# Patient Record
Sex: Female | Born: 1940 | Race: White | Hispanic: No | Marital: Married | Smoking: Former smoker
Health system: Southern US, Community
[De-identification: ages and names within clinical notes are randomized; demographics above are authoritative.]

## PROBLEM LIST (undated history)

## (undated) DIAGNOSIS — D649 Anemia, unspecified: Secondary | ICD-10-CM

## (undated) DIAGNOSIS — G894 Chronic pain syndrome: Secondary | ICD-10-CM

## (undated) DIAGNOSIS — M545 Low back pain, unspecified: Secondary | ICD-10-CM

## (undated) DIAGNOSIS — I2510 Atherosclerotic heart disease of native coronary artery without angina pectoris: Secondary | ICD-10-CM

## (undated) DIAGNOSIS — N259 Disorder resulting from impaired renal tubular function, unspecified: Secondary | ICD-10-CM

## (undated) DIAGNOSIS — I219 Acute myocardial infarction, unspecified: Secondary | ICD-10-CM

## (undated) DIAGNOSIS — R3989 Other symptoms and signs involving the genitourinary system: Secondary | ICD-10-CM

## (undated) DIAGNOSIS — K589 Irritable bowel syndrome without diarrhea: Secondary | ICD-10-CM

## (undated) DIAGNOSIS — E785 Hyperlipidemia, unspecified: Secondary | ICD-10-CM

## (undated) DIAGNOSIS — Z7901 Long term (current) use of anticoagulants: Secondary | ICD-10-CM

## (undated) DIAGNOSIS — K219 Gastro-esophageal reflux disease without esophagitis: Secondary | ICD-10-CM

## (undated) DIAGNOSIS — F411 Generalized anxiety disorder: Secondary | ICD-10-CM

## (undated) DIAGNOSIS — M889 Osteitis deformans of unspecified bone: Secondary | ICD-10-CM

## (undated) DIAGNOSIS — M1990 Unspecified osteoarthritis, unspecified site: Secondary | ICD-10-CM

## (undated) DIAGNOSIS — Z96659 Presence of unspecified artificial knee joint: Secondary | ICD-10-CM

## (undated) DIAGNOSIS — K922 Gastrointestinal hemorrhage, unspecified: Secondary | ICD-10-CM

## (undated) DIAGNOSIS — J449 Chronic obstructive pulmonary disease, unspecified: Secondary | ICD-10-CM

## (undated) DIAGNOSIS — J42 Unspecified chronic bronchitis: Secondary | ICD-10-CM

## (undated) DIAGNOSIS — I872 Venous insufficiency (chronic) (peripheral): Secondary | ICD-10-CM

## (undated) DIAGNOSIS — B0229 Other postherpetic nervous system involvement: Secondary | ICD-10-CM

## (undated) DIAGNOSIS — J189 Pneumonia, unspecified organism: Secondary | ICD-10-CM

## (undated) DIAGNOSIS — I4891 Unspecified atrial fibrillation: Secondary | ICD-10-CM

## (undated) DIAGNOSIS — B029 Zoster without complications: Secondary | ICD-10-CM

## (undated) DIAGNOSIS — I459 Conduction disorder, unspecified: Secondary | ICD-10-CM

## (undated) DIAGNOSIS — M797 Fibromyalgia: Secondary | ICD-10-CM

## (undated) HISTORY — DX: Gastrointestinal hemorrhage, unspecified: K92.2

## (undated) HISTORY — DX: Other postherpetic nervous system involvement: B02.29

## (undated) HISTORY — PX: VESICOVAGINAL FISTULA CLOSURE W/ TAH: SUR271

## (undated) HISTORY — DX: Atherosclerotic heart disease of native coronary artery without angina pectoris: I25.10

## (undated) HISTORY — DX: Low back pain: M54.5

## (undated) HISTORY — DX: Generalized anxiety disorder: F41.1

## (undated) HISTORY — PX: APPENDECTOMY: SHX54

## (undated) HISTORY — DX: Chronic pain syndrome: G89.4

## (undated) HISTORY — DX: Disorder resulting from impaired renal tubular function, unspecified: N25.9

## (undated) HISTORY — DX: Presence of unspecified artificial knee joint: Z96.659

## (undated) HISTORY — DX: Other symptoms and signs involving the genitourinary system: R39.89

## (undated) HISTORY — PX: NECK SURGERY: SHX720

## (undated) HISTORY — DX: Unspecified atrial fibrillation: I48.91

## (undated) HISTORY — DX: Unspecified osteoarthritis, unspecified site: M19.90

## (undated) HISTORY — DX: Venous insufficiency (chronic) (peripheral): I87.2

## (undated) HISTORY — DX: Zoster without complications: B02.9

## (undated) HISTORY — DX: Osteitis deformans of unspecified bone: M88.9

## (undated) HISTORY — PX: NISSEN FUNDOPLICATION: SHX2091

## (undated) HISTORY — DX: Conduction disorder, unspecified: I45.9

## (undated) HISTORY — DX: Low back pain, unspecified: M54.50

## (undated) HISTORY — DX: Morbid (severe) obesity due to excess calories: E66.01

## (undated) HISTORY — DX: Irritable bowel syndrome, unspecified: K58.9

## (undated) HISTORY — PX: BACK SURGERY: SHX140

## (undated) HISTORY — DX: Gastro-esophageal reflux disease without esophagitis: K21.9

## (undated) HISTORY — PX: OTHER SURGICAL HISTORY: SHX169

## (undated) HISTORY — PX: CHOLECYSTECTOMY: SHX55

## (undated) HISTORY — PX: ABDOMINAL HYSTERECTOMY: SHX81

## (undated) HISTORY — DX: Hyperlipidemia, unspecified: E78.5

---

## 1995-07-21 HISTORY — PX: CORONARY ARTERY BYPASS GRAFT: SHX141

## 1998-02-08 ENCOUNTER — Ambulatory Visit (HOSPITAL_COMMUNITY): Admission: RE | Admit: 1998-02-08 | Discharge: 1998-02-08 | Payer: Self-pay | Admitting: Orthopedic Surgery

## 1998-03-17 ENCOUNTER — Inpatient Hospital Stay (HOSPITAL_COMMUNITY): Admission: EM | Admit: 1998-03-17 | Discharge: 1998-03-19 | Payer: Self-pay | Admitting: Emergency Medicine

## 1998-07-04 ENCOUNTER — Ambulatory Visit (HOSPITAL_COMMUNITY): Admission: RE | Admit: 1998-07-04 | Discharge: 1998-07-04 | Payer: Self-pay | Admitting: Orthopedic Surgery

## 1999-03-17 ENCOUNTER — Encounter: Payer: Self-pay | Admitting: Orthopedic Surgery

## 1999-03-19 ENCOUNTER — Encounter: Payer: Self-pay | Admitting: Orthopedic Surgery

## 1999-03-19 ENCOUNTER — Encounter (INDEPENDENT_AMBULATORY_CARE_PROVIDER_SITE_OTHER): Payer: Self-pay | Admitting: Specialist

## 1999-03-20 ENCOUNTER — Inpatient Hospital Stay (HOSPITAL_COMMUNITY): Admission: EM | Admit: 1999-03-20 | Discharge: 1999-03-21 | Payer: Self-pay | Admitting: Orthopedic Surgery

## 1999-10-16 ENCOUNTER — Ambulatory Visit (HOSPITAL_COMMUNITY): Admission: RE | Admit: 1999-10-16 | Discharge: 1999-10-16 | Payer: Self-pay | Admitting: Pulmonary Disease

## 1999-10-16 ENCOUNTER — Encounter: Payer: Self-pay | Admitting: Pulmonary Disease

## 2000-05-15 ENCOUNTER — Encounter: Admission: RE | Admit: 2000-05-15 | Discharge: 2000-05-15 | Payer: Self-pay

## 2000-05-21 ENCOUNTER — Encounter: Payer: Self-pay | Admitting: Neurosurgery

## 2000-05-21 ENCOUNTER — Encounter: Payer: Self-pay | Admitting: Cardiology

## 2000-05-22 ENCOUNTER — Inpatient Hospital Stay (HOSPITAL_COMMUNITY): Admission: AD | Admit: 2000-05-22 | Discharge: 2000-05-25 | Payer: Self-pay | Admitting: Neurosurgery

## 2000-06-08 ENCOUNTER — Encounter: Payer: Self-pay | Admitting: Neurosurgery

## 2000-06-09 ENCOUNTER — Inpatient Hospital Stay (HOSPITAL_COMMUNITY): Admission: RE | Admit: 2000-06-09 | Discharge: 2000-06-15 | Payer: Self-pay | Admitting: Neurosurgery

## 2000-06-09 ENCOUNTER — Encounter: Payer: Self-pay | Admitting: Neurosurgery

## 2000-07-07 ENCOUNTER — Encounter: Payer: Self-pay | Admitting: Neurosurgery

## 2000-07-07 ENCOUNTER — Encounter: Admission: RE | Admit: 2000-07-07 | Discharge: 2000-07-07 | Payer: Self-pay | Admitting: Neurosurgery

## 2000-09-08 ENCOUNTER — Encounter: Admission: RE | Admit: 2000-09-08 | Discharge: 2000-09-08 | Payer: Self-pay | Admitting: Neurosurgery

## 2000-09-08 ENCOUNTER — Encounter: Payer: Self-pay | Admitting: Neurosurgery

## 2000-09-14 ENCOUNTER — Ambulatory Visit (HOSPITAL_COMMUNITY): Admission: RE | Admit: 2000-09-14 | Discharge: 2000-09-14 | Payer: Self-pay | Admitting: Neurosurgery

## 2000-09-14 ENCOUNTER — Encounter: Payer: Self-pay | Admitting: Neurosurgery

## 2000-09-29 ENCOUNTER — Ambulatory Visit (HOSPITAL_COMMUNITY): Admission: RE | Admit: 2000-09-29 | Discharge: 2000-09-29 | Payer: Self-pay | Admitting: Neurosurgery

## 2000-09-29 ENCOUNTER — Encounter: Payer: Self-pay | Admitting: Neurosurgery

## 2000-10-13 ENCOUNTER — Encounter: Payer: Self-pay | Admitting: Neurosurgery

## 2000-10-13 ENCOUNTER — Ambulatory Visit (HOSPITAL_COMMUNITY): Admission: RE | Admit: 2000-10-13 | Discharge: 2000-10-13 | Payer: Self-pay | Admitting: Neurosurgery

## 2000-10-27 ENCOUNTER — Encounter: Admission: RE | Admit: 2000-10-27 | Discharge: 2000-10-27 | Payer: Self-pay | Admitting: Neurosurgery

## 2000-10-27 ENCOUNTER — Encounter: Payer: Self-pay | Admitting: Neurosurgery

## 2000-12-17 ENCOUNTER — Encounter: Payer: Self-pay | Admitting: Neurosurgery

## 2000-12-17 ENCOUNTER — Inpatient Hospital Stay (HOSPITAL_COMMUNITY): Admission: RE | Admit: 2000-12-17 | Discharge: 2000-12-19 | Payer: Self-pay | Admitting: Neurosurgery

## 2001-03-04 ENCOUNTER — Encounter: Payer: Self-pay | Admitting: Obstetrics and Gynecology

## 2001-03-04 ENCOUNTER — Ambulatory Visit (HOSPITAL_COMMUNITY): Admission: RE | Admit: 2001-03-04 | Discharge: 2001-03-04 | Payer: Self-pay | Admitting: Obstetrics and Gynecology

## 2001-07-31 ENCOUNTER — Emergency Department (HOSPITAL_COMMUNITY): Admission: EM | Admit: 2001-07-31 | Discharge: 2001-07-31 | Payer: Self-pay | Admitting: Emergency Medicine

## 2002-07-07 ENCOUNTER — Inpatient Hospital Stay (HOSPITAL_COMMUNITY): Admission: EM | Admit: 2002-07-07 | Discharge: 2002-07-12 | Payer: Self-pay | Admitting: *Deleted

## 2002-09-22 ENCOUNTER — Ambulatory Visit (HOSPITAL_COMMUNITY): Admission: RE | Admit: 2002-09-22 | Discharge: 2002-09-22 | Payer: Self-pay

## 2002-10-16 ENCOUNTER — Ambulatory Visit (HOSPITAL_COMMUNITY): Admission: RE | Admit: 2002-10-16 | Discharge: 2002-10-16 | Payer: Self-pay

## 2003-04-01 ENCOUNTER — Encounter: Payer: Self-pay | Admitting: Cardiovascular Disease

## 2003-04-01 ENCOUNTER — Inpatient Hospital Stay (HOSPITAL_COMMUNITY): Admission: EM | Admit: 2003-04-01 | Discharge: 2003-04-10 | Payer: Self-pay | Admitting: Cardiovascular Disease

## 2003-06-08 ENCOUNTER — Ambulatory Visit (HOSPITAL_COMMUNITY): Admission: RE | Admit: 2003-06-08 | Discharge: 2003-06-08 | Payer: Self-pay

## 2003-07-21 HISTORY — PX: EYE SURGERY: SHX253

## 2003-11-16 ENCOUNTER — Inpatient Hospital Stay (HOSPITAL_COMMUNITY): Admission: AD | Admit: 2003-11-16 | Discharge: 2003-11-18 | Payer: Self-pay | Admitting: Internal Medicine

## 2004-02-10 ENCOUNTER — Inpatient Hospital Stay (HOSPITAL_COMMUNITY): Admission: EM | Admit: 2004-02-10 | Discharge: 2004-02-12 | Payer: Self-pay | Admitting: Emergency Medicine

## 2004-06-03 ENCOUNTER — Ambulatory Visit: Payer: Self-pay | Admitting: Cardiology

## 2004-06-08 ENCOUNTER — Encounter: Admission: RE | Admit: 2004-06-08 | Discharge: 2004-06-08 | Payer: Self-pay

## 2004-06-13 ENCOUNTER — Ambulatory Visit: Payer: Self-pay | Admitting: Internal Medicine

## 2004-06-27 ENCOUNTER — Ambulatory Visit: Payer: Self-pay | Admitting: Pulmonary Disease

## 2004-07-15 ENCOUNTER — Ambulatory Visit: Payer: Self-pay | Admitting: Cardiology

## 2004-07-29 ENCOUNTER — Ambulatory Visit: Payer: Self-pay | Admitting: Cardiology

## 2004-08-27 ENCOUNTER — Ambulatory Visit: Payer: Self-pay | Admitting: Internal Medicine

## 2004-09-05 ENCOUNTER — Ambulatory Visit: Payer: Self-pay | Admitting: Cardiology

## 2004-09-29 ENCOUNTER — Ambulatory Visit: Payer: Self-pay | Admitting: Pulmonary Disease

## 2004-10-03 ENCOUNTER — Ambulatory Visit: Payer: Self-pay | Admitting: Cardiology

## 2004-10-14 ENCOUNTER — Ambulatory Visit: Payer: Self-pay | Admitting: Pulmonary Disease

## 2004-10-17 ENCOUNTER — Ambulatory Visit: Payer: Self-pay | Admitting: Pulmonary Disease

## 2004-10-22 ENCOUNTER — Ambulatory Visit: Payer: Self-pay | Admitting: Pulmonary Disease

## 2004-10-28 ENCOUNTER — Inpatient Hospital Stay (HOSPITAL_COMMUNITY): Admission: EM | Admit: 2004-10-28 | Discharge: 2004-10-31 | Payer: Self-pay | Admitting: Emergency Medicine

## 2004-10-30 ENCOUNTER — Ambulatory Visit: Payer: Self-pay | Admitting: Pulmonary Disease

## 2004-11-18 ENCOUNTER — Ambulatory Visit: Payer: Self-pay | Admitting: Internal Medicine

## 2004-12-16 ENCOUNTER — Ambulatory Visit: Payer: Self-pay | Admitting: Cardiology

## 2005-01-13 ENCOUNTER — Ambulatory Visit: Payer: Self-pay | Admitting: Cardiology

## 2005-01-21 ENCOUNTER — Ambulatory Visit: Payer: Self-pay | Admitting: Pulmonary Disease

## 2005-02-02 ENCOUNTER — Ambulatory Visit: Payer: Self-pay | Admitting: Pulmonary Disease

## 2005-02-03 ENCOUNTER — Ambulatory Visit: Payer: Self-pay | Admitting: Cardiology

## 2005-03-12 ENCOUNTER — Ambulatory Visit: Payer: Self-pay | Admitting: Cardiology

## 2005-04-02 ENCOUNTER — Ambulatory Visit: Payer: Self-pay | Admitting: Internal Medicine

## 2005-04-16 ENCOUNTER — Ambulatory Visit: Payer: Self-pay | Admitting: Cardiology

## 2005-07-22 ENCOUNTER — Ambulatory Visit: Payer: Self-pay | Admitting: Pulmonary Disease

## 2005-09-23 ENCOUNTER — Ambulatory Visit: Payer: Self-pay | Admitting: Pulmonary Disease

## 2006-01-07 ENCOUNTER — Ambulatory Visit: Payer: Self-pay | Admitting: Pulmonary Disease

## 2006-03-16 ENCOUNTER — Ambulatory Visit (HOSPITAL_COMMUNITY): Admission: RE | Admit: 2006-03-16 | Discharge: 2006-03-16 | Payer: Self-pay

## 2006-03-24 ENCOUNTER — Ambulatory Visit: Payer: Self-pay | Admitting: Gastroenterology

## 2006-03-29 ENCOUNTER — Ambulatory Visit: Payer: Self-pay | Admitting: Gastroenterology

## 2006-04-29 DIAGNOSIS — I2510 Atherosclerotic heart disease of native coronary artery without angina pectoris: Secondary | ICD-10-CM

## 2006-04-29 HISTORY — DX: Atherosclerotic heart disease of native coronary artery without angina pectoris: I25.10

## 2006-07-27 ENCOUNTER — Ambulatory Visit: Payer: Self-pay | Admitting: Pulmonary Disease

## 2006-09-03 ENCOUNTER — Ambulatory Visit: Payer: Self-pay | Admitting: Pulmonary Disease

## 2006-09-03 LAB — CONVERTED CEMR LAB
Bilirubin Urine: NEGATIVE
Hemoglobin, Urine: NEGATIVE
Leukocytes, UA: NEGATIVE
Nitrite: NEGATIVE
Specific Gravity, Urine: 1.015 (ref 1.000–1.03)
Total Protein, Urine: NEGATIVE mg/dL
Urobilinogen, UA: 0.2 (ref 0.0–1.0)

## 2006-10-18 ENCOUNTER — Emergency Department (HOSPITAL_COMMUNITY): Admission: EM | Admit: 2006-10-18 | Discharge: 2006-10-18 | Payer: Self-pay | Admitting: Family Medicine

## 2006-10-20 ENCOUNTER — Emergency Department (HOSPITAL_COMMUNITY): Admission: EM | Admit: 2006-10-20 | Discharge: 2006-10-20 | Payer: Self-pay | Admitting: Family Medicine

## 2006-12-23 ENCOUNTER — Encounter: Admission: RE | Admit: 2006-12-23 | Discharge: 2006-12-23 | Payer: Self-pay

## 2007-02-11 ENCOUNTER — Ambulatory Visit: Payer: Self-pay | Admitting: Pulmonary Disease

## 2007-03-09 ENCOUNTER — Observation Stay (HOSPITAL_COMMUNITY): Admission: EM | Admit: 2007-03-09 | Discharge: 2007-03-09 | Payer: Self-pay | Admitting: Emergency Medicine

## 2007-05-16 ENCOUNTER — Ambulatory Visit: Payer: Self-pay | Admitting: Pulmonary Disease

## 2007-06-24 ENCOUNTER — Ambulatory Visit (HOSPITAL_COMMUNITY): Admission: RE | Admit: 2007-06-24 | Discharge: 2007-06-24 | Payer: Self-pay | Admitting: Pulmonary Disease

## 2007-07-25 DIAGNOSIS — K589 Irritable bowel syndrome without diarrhea: Secondary | ICD-10-CM | POA: Insufficient documentation

## 2007-07-25 DIAGNOSIS — I872 Venous insufficiency (chronic) (peripheral): Secondary | ICD-10-CM | POA: Insufficient documentation

## 2007-07-25 DIAGNOSIS — E785 Hyperlipidemia, unspecified: Secondary | ICD-10-CM | POA: Insufficient documentation

## 2007-09-16 ENCOUNTER — Ambulatory Visit: Payer: Self-pay | Admitting: Pulmonary Disease

## 2007-09-16 DIAGNOSIS — I4891 Unspecified atrial fibrillation: Secondary | ICD-10-CM | POA: Insufficient documentation

## 2007-09-16 DIAGNOSIS — M889 Osteitis deformans of unspecified bone: Secondary | ICD-10-CM | POA: Insufficient documentation

## 2007-09-16 DIAGNOSIS — M545 Low back pain, unspecified: Secondary | ICD-10-CM | POA: Insufficient documentation

## 2007-09-16 DIAGNOSIS — G894 Chronic pain syndrome: Secondary | ICD-10-CM | POA: Insufficient documentation

## 2007-09-16 DIAGNOSIS — N259 Disorder resulting from impaired renal tubular function, unspecified: Secondary | ICD-10-CM | POA: Insufficient documentation

## 2007-09-16 LAB — CONVERTED CEMR LAB: Vit D, 1,25-Dihydroxy: 48 (ref 30–89)

## 2007-09-18 LAB — CONVERTED CEMR LAB
Basophils Relative: 0.9 % (ref 0.0–1.0)
Bilirubin, Direct: 0.2 mg/dL (ref 0.0–0.3)
CO2: 30 meq/L (ref 19–32)
Cholesterol: 112 mg/dL (ref 0–200)
Creatinine, Ser: 1.6 mg/dL — ABNORMAL HIGH (ref 0.4–1.2)
Eosinophils Relative: 2.4 % (ref 0.0–5.0)
GFR calc Af Amer: 42 mL/min
Glucose, Bld: 96 mg/dL (ref 70–99)
HDL: 43 mg/dL (ref >39.0)
LDL Cholesterol: 54 mg/dL (ref 0–99)
Monocytes Relative: 8.2 % (ref 3.0–11.0)
Platelets: 140 10*3/uL — ABNORMAL LOW (ref 150–400)
Potassium: 4.3 meq/L (ref 3.5–5.1)
RDW: 13 % (ref 11.5–14.6)
Sodium: 139 meq/L (ref 135–145)
Total CHOL/HDL Ratio: 2.6
VLDL: 15 mg/dL (ref 0–40)
WBC: 5.3 10*3/uL (ref 4.5–10.5)

## 2007-09-22 ENCOUNTER — Telehealth (INDEPENDENT_AMBULATORY_CARE_PROVIDER_SITE_OTHER): Payer: Self-pay | Admitting: *Deleted

## 2007-11-14 ENCOUNTER — Telehealth (INDEPENDENT_AMBULATORY_CARE_PROVIDER_SITE_OTHER): Payer: Self-pay | Admitting: *Deleted

## 2008-03-14 ENCOUNTER — Ambulatory Visit: Payer: Self-pay | Admitting: Pulmonary Disease

## 2008-04-11 ENCOUNTER — Ambulatory Visit: Payer: Self-pay | Admitting: Pulmonary Disease

## 2008-04-20 ENCOUNTER — Encounter: Payer: Self-pay | Admitting: Pulmonary Disease

## 2008-04-25 ENCOUNTER — Encounter: Admission: RE | Admit: 2008-04-25 | Discharge: 2008-04-25 | Payer: Self-pay | Admitting: Cardiovascular Disease

## 2008-05-01 ENCOUNTER — Inpatient Hospital Stay (HOSPITAL_COMMUNITY): Admission: RE | Admit: 2008-05-01 | Discharge: 2008-05-03 | Payer: Self-pay | Admitting: Cardiovascular Disease

## 2008-05-17 ENCOUNTER — Telehealth: Payer: Self-pay | Admitting: Pulmonary Disease

## 2008-06-25 ENCOUNTER — Ambulatory Visit (HOSPITAL_COMMUNITY): Admission: RE | Admit: 2008-06-25 | Discharge: 2008-06-25 | Payer: Self-pay | Admitting: Pulmonary Disease

## 2008-08-30 ENCOUNTER — Encounter: Payer: Self-pay | Admitting: Pulmonary Disease

## 2008-09-14 ENCOUNTER — Ambulatory Visit: Payer: Self-pay | Admitting: Pulmonary Disease

## 2008-09-15 DIAGNOSIS — F411 Generalized anxiety disorder: Secondary | ICD-10-CM | POA: Insufficient documentation

## 2008-09-18 ENCOUNTER — Ambulatory Visit: Payer: Self-pay | Admitting: Pulmonary Disease

## 2008-09-23 LAB — CONVERTED CEMR LAB
Basophils Relative: 0.6 % (ref 0.0–3.0)
Bilirubin Urine: NEGATIVE
Bilirubin, Direct: 0.1 mg/dL (ref 0.0–0.3)
Calcium: 9.2 mg/dL (ref 8.4–10.5)
Cholesterol: 166 mg/dL (ref 0–200)
Creatinine, Ser: 1.7 mg/dL — ABNORMAL HIGH (ref 0.4–1.2)
GFR calc non Af Amer: 32 mL/min
Glucose, Bld: 99 mg/dL (ref 70–99)
HCT: 39.7 % (ref 36.0–46.0)
HDL: 40.3 mg/dL (ref >39.0)
Hemoglobin: 13.4 g/dL (ref 12.0–15.0)
LDL Cholesterol: 98 mg/dL (ref 0–99)
Leukocytes, UA: NEGATIVE
Lymphocytes Relative: 34.6 % (ref 12.0–46.0)
Neutro Abs: 2.7 10*3/uL (ref 1.4–7.7)
Platelets: 138 10*3/uL — ABNORMAL LOW (ref 150–400)
Potassium: 4.6 meq/L (ref 3.5–5.1)
Sodium: 142 meq/L (ref 135–145)
TSH: 1.78 microintl units/mL (ref 0.35–5.50)
Total Bilirubin: 1.1 mg/dL (ref 0.3–1.2)
Total CHOL/HDL Ratio: 4.1
Total Protein, Urine: NEGATIVE mg/dL
Total Protein: 8.1 g/dL (ref 6.0–8.3)
Triglycerides: 137 mg/dL (ref 0–149)
Urobilinogen, UA: 0.2 (ref 0.0–1.0)
VLDL: 27 mg/dL (ref 0–40)
pH: 5.5 (ref 5.0–8.0)

## 2008-11-23 ENCOUNTER — Telehealth: Payer: Self-pay | Admitting: Pulmonary Disease

## 2008-12-03 ENCOUNTER — Telehealth (INDEPENDENT_AMBULATORY_CARE_PROVIDER_SITE_OTHER): Payer: Self-pay | Admitting: *Deleted

## 2008-12-05 ENCOUNTER — Ambulatory Visit: Payer: Self-pay | Admitting: Internal Medicine

## 2008-12-05 ENCOUNTER — Ambulatory Visit: Payer: Self-pay | Admitting: Pulmonary Disease

## 2008-12-10 ENCOUNTER — Ambulatory Visit: Payer: Self-pay | Admitting: Internal Medicine

## 2008-12-10 DIAGNOSIS — B029 Zoster without complications: Secondary | ICD-10-CM | POA: Insufficient documentation

## 2008-12-12 ENCOUNTER — Telehealth: Payer: Self-pay | Admitting: Pulmonary Disease

## 2008-12-24 ENCOUNTER — Ambulatory Visit: Payer: Self-pay | Admitting: Pulmonary Disease

## 2008-12-31 ENCOUNTER — Telehealth (INDEPENDENT_AMBULATORY_CARE_PROVIDER_SITE_OTHER): Payer: Self-pay | Admitting: *Deleted

## 2009-01-16 ENCOUNTER — Telehealth (INDEPENDENT_AMBULATORY_CARE_PROVIDER_SITE_OTHER): Payer: Self-pay | Admitting: *Deleted

## 2009-01-17 ENCOUNTER — Ambulatory Visit: Payer: Self-pay | Admitting: Pulmonary Disease

## 2009-01-17 DIAGNOSIS — B0229 Other postherpetic nervous system involvement: Secondary | ICD-10-CM | POA: Insufficient documentation

## 2009-02-08 ENCOUNTER — Ambulatory Visit: Payer: Self-pay | Admitting: Internal Medicine

## 2009-02-09 ENCOUNTER — Encounter: Payer: Self-pay | Admitting: Adult Health

## 2009-02-13 ENCOUNTER — Ambulatory Visit: Payer: Self-pay | Admitting: Pulmonary Disease

## 2009-03-11 ENCOUNTER — Encounter: Payer: Self-pay | Admitting: Pulmonary Disease

## 2009-03-14 ENCOUNTER — Encounter: Payer: Self-pay | Admitting: Pulmonary Disease

## 2009-03-19 ENCOUNTER — Ambulatory Visit: Payer: Self-pay | Admitting: Pulmonary Disease

## 2009-03-22 ENCOUNTER — Encounter: Payer: Self-pay | Admitting: Pulmonary Disease

## 2009-05-07 ENCOUNTER — Telehealth: Payer: Self-pay | Admitting: Pulmonary Disease

## 2009-05-08 ENCOUNTER — Telehealth: Payer: Self-pay | Admitting: Pulmonary Disease

## 2009-05-08 ENCOUNTER — Encounter: Payer: Self-pay | Admitting: Adult Health

## 2009-05-08 ENCOUNTER — Ambulatory Visit: Payer: Self-pay | Admitting: Pulmonary Disease

## 2009-05-14 LAB — CONVERTED CEMR LAB
Alkaline Phosphatase: 84 units/L (ref 39–117)
BUN: 20 mg/dL (ref 6–23)
Basophils Absolute: 0 10*3/uL (ref 0.0–0.1)
Basophils Relative: 0.8 % (ref 0.0–3.0)
Bilirubin Urine: NEGATIVE
CO2: 30 meq/L (ref 19–32)
Calcium: 9.1 mg/dL (ref 8.4–10.5)
Cholesterol: 120 mg/dL (ref 0–200)
Creatinine, Ser: 1.8 mg/dL — ABNORMAL HIGH (ref 0.4–1.2)
Eosinophils Relative: 1.9 % (ref 0.0–5.0)
Hemoglobin, Urine: NEGATIVE
Hemoglobin: 12.7 g/dL (ref 12.0–15.0)
INR: 2.1 — ABNORMAL HIGH (ref 0.8–1.0)
LDL Cholesterol: 55 mg/dL (ref 0–99)
Lymphs Abs: 1.4 10*3/uL (ref 0.7–4.0)
MCHC: 34.4 g/dL (ref 30.0–36.0)
MCV: 103.6 fL — ABNORMAL HIGH (ref 78.0–100.0)
Monocytes Absolute: 0.5 10*3/uL (ref 0.1–1.0)
Neutro Abs: 3.9 10*3/uL (ref 1.4–7.7)
Prothrombin Time: 21.5 s — ABNORMAL HIGH (ref 9.1–11.7)
Triglycerides: 91 mg/dL (ref 0.0–149.0)
Urobilinogen, UA: 0.2 (ref 0.0–1.0)
VLDL: 18.2 mg/dL (ref 0.0–40.0)
WBC: 5.9 10*3/uL (ref 4.5–10.5)

## 2009-05-24 ENCOUNTER — Ambulatory Visit: Payer: Self-pay | Admitting: Pulmonary Disease

## 2009-05-24 ENCOUNTER — Telehealth: Payer: Self-pay | Admitting: Adult Health

## 2009-06-03 ENCOUNTER — Telehealth (INDEPENDENT_AMBULATORY_CARE_PROVIDER_SITE_OTHER): Payer: Self-pay | Admitting: *Deleted

## 2009-06-05 ENCOUNTER — Ambulatory Visit: Payer: Self-pay | Admitting: Pulmonary Disease

## 2009-07-09 ENCOUNTER — Ambulatory Visit: Payer: Self-pay | Admitting: Pulmonary Disease

## 2009-08-21 ENCOUNTER — Encounter: Admission: RE | Admit: 2009-08-21 | Discharge: 2009-08-21 | Payer: Self-pay | Admitting: Specialist

## 2009-08-23 ENCOUNTER — Telehealth (INDEPENDENT_AMBULATORY_CARE_PROVIDER_SITE_OTHER): Payer: Self-pay | Admitting: *Deleted

## 2009-09-13 ENCOUNTER — Observation Stay (HOSPITAL_COMMUNITY): Admission: EM | Admit: 2009-09-13 | Discharge: 2009-09-16 | Payer: Self-pay | Admitting: Emergency Medicine

## 2009-10-16 ENCOUNTER — Encounter: Admission: RE | Admit: 2009-10-16 | Discharge: 2009-10-16 | Payer: Self-pay | Admitting: Orthopedic Surgery

## 2009-11-12 ENCOUNTER — Ambulatory Visit: Payer: Self-pay | Admitting: Pulmonary Disease

## 2009-11-16 LAB — CONVERTED CEMR LAB
Basophils Absolute: 0.1 10*3/uL (ref 0.0–0.1)
Bilirubin, Direct: 0.1 mg/dL (ref 0.0–0.3)
CO2: 35 meq/L — ABNORMAL HIGH (ref 19–32)
Calcium: 9.3 mg/dL (ref 8.4–10.5)
Chloride: 98 meq/L (ref 96–112)
Creatinine, Ser: 1.6 mg/dL — ABNORMAL HIGH (ref 0.4–1.2)
Eosinophils Absolute: 0.1 10*3/uL (ref 0.0–0.7)
Glucose, Bld: 109 mg/dL — ABNORMAL HIGH (ref 70–99)
HCT: 33.8 % — ABNORMAL LOW (ref 36.0–46.0)
Hemoglobin: 11.6 g/dL — ABNORMAL LOW (ref 12.0–15.0)
Iron: 118 ug/dL (ref 42–145)
Lymphocytes Relative: 34.4 % (ref 12.0–46.0)
MCV: 96.6 fL (ref 78.0–100.0)
Monocytes Absolute: 0.6 10*3/uL (ref 0.1–1.0)
Monocytes Relative: 8.4 % (ref 3.0–12.0)
RDW: 17 % — ABNORMAL HIGH (ref 11.5–14.6)
Saturation Ratios: 33.2 % (ref 20.0–50.0)
Sodium: 141 meq/L (ref 135–145)
TSH: 2.33 microintl units/mL (ref 0.35–5.50)
Total Bilirubin: 1 mg/dL (ref 0.3–1.2)
Transferrin: 253.8 mg/dL (ref 212.0–360.0)
Vit D, 25-Hydroxy: 52 ng/mL (ref 30–89)
WBC: 7.5 10*3/uL (ref 4.5–10.5)

## 2009-12-20 ENCOUNTER — Encounter (HOSPITAL_COMMUNITY): Admission: RE | Admit: 2009-12-20 | Discharge: 2010-02-18 | Payer: Self-pay | Admitting: Internal Medicine

## 2010-01-03 ENCOUNTER — Inpatient Hospital Stay (HOSPITAL_COMMUNITY): Admission: AD | Admit: 2010-01-03 | Discharge: 2010-01-08 | Payer: Self-pay | Admitting: Cardiovascular Disease

## 2010-01-06 HISTORY — PX: CARDIAC CATHETERIZATION: SHX172

## 2010-01-23 ENCOUNTER — Encounter: Payer: Self-pay | Admitting: Pulmonary Disease

## 2010-02-10 ENCOUNTER — Telehealth: Payer: Self-pay | Admitting: Pulmonary Disease

## 2010-02-11 ENCOUNTER — Encounter: Admission: RE | Admit: 2010-02-11 | Discharge: 2010-02-11 | Payer: Self-pay | Admitting: Pulmonary Disease

## 2010-03-12 ENCOUNTER — Ambulatory Visit: Payer: Self-pay | Admitting: Pulmonary Disease

## 2010-03-12 DIAGNOSIS — N3289 Other specified disorders of bladder: Secondary | ICD-10-CM

## 2010-04-18 ENCOUNTER — Ambulatory Visit: Payer: Self-pay | Admitting: Pulmonary Disease

## 2010-04-18 ENCOUNTER — Telehealth (INDEPENDENT_AMBULATORY_CARE_PROVIDER_SITE_OTHER): Payer: Self-pay | Admitting: *Deleted

## 2010-04-30 ENCOUNTER — Encounter: Payer: Self-pay | Admitting: Pulmonary Disease

## 2010-04-30 ENCOUNTER — Telehealth: Payer: Self-pay | Admitting: Pulmonary Disease

## 2010-05-28 ENCOUNTER — Telehealth (INDEPENDENT_AMBULATORY_CARE_PROVIDER_SITE_OTHER): Payer: Self-pay | Admitting: *Deleted

## 2010-07-02 ENCOUNTER — Ambulatory Visit: Payer: Self-pay | Admitting: Pulmonary Disease

## 2010-07-04 ENCOUNTER — Telehealth (INDEPENDENT_AMBULATORY_CARE_PROVIDER_SITE_OTHER): Payer: Self-pay | Admitting: *Deleted

## 2010-07-14 DIAGNOSIS — T148XXA Other injury of unspecified body region, initial encounter: Secondary | ICD-10-CM | POA: Insufficient documentation

## 2010-07-14 DIAGNOSIS — R3989 Other symptoms and signs involving the genitourinary system: Secondary | ICD-10-CM | POA: Insufficient documentation

## 2010-08-09 ENCOUNTER — Encounter: Payer: Self-pay | Admitting: Pulmonary Disease

## 2010-08-10 ENCOUNTER — Encounter: Payer: Self-pay | Admitting: Pulmonary Disease

## 2010-08-12 ENCOUNTER — Encounter: Payer: Self-pay | Admitting: Pulmonary Disease

## 2010-08-18 ENCOUNTER — Telehealth (INDEPENDENT_AMBULATORY_CARE_PROVIDER_SITE_OTHER): Payer: Self-pay | Admitting: *Deleted

## 2010-08-19 NOTE — Letter (Signed)
Summary: Medical Clearance/[facility]  Medical Clearance/[facility]   Imported By: Sherian Rein 05/09/2010 12:19:54  _____________________________________________________________________  External Attachment:    Type:   Image     Comment:   External Document

## 2010-08-19 NOTE — Assessment & Plan Note (Signed)
Summary: sob/ mbw   CC:  cough and congestion for 10 days. Marland Kitchen  History of Present Illness: 70  y/o WF with known hx of  ASHD w/ prev CABG and chr AFib on coumadin followed by DrBerry... she also has FM/ chr pain syndrome followed by [facility] & on meds from him...   May 24, 2009--Presents for an acute office visit. Complains of increased SOB, wheezing, hoarseness, prod cough with yellow/green mucus x4days. Coughing so much her ribs are hurting. Wheezing is getting worse. Mucinex and nebs are not working. Pain on inspiration.  Seen last visit, dizziness resolved w/ meclizine. Labs reviewed w/ pt , essentially unremarkable w/ chronic changes.   June 05, 2009-Returns for persistent symptoms of productive cough, with thick green mucus, wheeizng and weakness,  increased SOB and pain across mid back and into left side of chest.  Mucus is thick. Cough is harsh, c/o that back hurts when she bends, coughs. .CXR today with no acute changes. She has good appetite. no exertional chest pain. Has chronic pain, used pain meds w/ some help muscle ache more over last 2 weeks. not taking muscle relaxer .  Felt better on prednisone.    April 18, 2010--Presents for an acute office visit. Complains of increased congestion, dyspnea and wheezing for last 10 days. Gets very tight in chest after coughing.  Denies chest pain, dyspnea, orthopnea, hemoptysis, fever, n/v/d, edema, headache. OTC not helping. Worse in evenings. Cough w/ thick mucus in am.     Current Medications (verified): 1)  Symbicort 160-4.5 Mcg/act Aero (Budesonide-Formoterol Fumarate) .... 2 Inhalations Two Times A Day 2)  Albuterol Sulfate (2.5 Mg/54ml) 0.083%  Nebu (Albuterol Sulfate) .... Use in Neb Treatment Up To Four Times A Day As Needed For Wheezing... 3)  Proventil Hfa 108 (90 Base) Mcg/act  Aers (Albuterol Sulfate) .Marland Kitchen.. 1-2 Sprays Q6h As Needed Wheezing... 4)  Mucinex Maximum Strength 1200 Mg  Tb12 (Guaifenesin) .Marland Kitchen.. 1 Tab By Mouth Two  Times A Day With Fluids.Marland KitchenMarland Kitchen 5)  Coumadin 5 Mg  Solr (Warfarin Sodium) .... Take As Dierected By Drberry.... 6)  Isosorbide Mononitrate Cr 30 Mg Xr24h-Tab (Isosorbide Mononitrate) .... Take One Tablet By Mouth Once Daily 7)  Metoprolol Tartrate 50 Mg  Tabs (Metoprolol Tartrate) .Marland Kitchen.. 1 Tab By Mouth Two Times A Day... 8)  Furosemide 40 Mg  Tabs (Furosemide) .Marland Kitchen.. 1 Tab By Mouth Two Times A Day... 9)  Klor-Con M20 20 Meq  Tbcr (Potassium Chloride Crys Cr) .Marland Kitchen.. 1 Tab By Mouth Two Times A Day... 10)  Simvastatin 20 Mg  Tabs (Simvastatin) .Marland Kitchen.. 1 Tab By Mouth At Bedtime... 11)  Eq Omeprazole 20 Mg Tbec (Omeprazole) .... Take One Tablet By Mouth Two Times A Day 12)  Neurontin 600 Mg  Tabs (Gabapentin) .Marland Kitchen.. 1 Tab By Mouth Four Times A Day As Directed... 13)  Percocet 5-325 Mg Tabs (Oxycodone-Acetaminophen) .... Take As Directed By Drrowe... 14)  Amitriptyline Hcl 50 Mg  Tabs (Amitriptyline Hcl) .... Take Two Tablets By Mouth At Bedtime 15)  Alprazolam 0.5 Mg  Tabs (Alprazolam) .Marland Kitchen.. 1 Tab By Mouth Qid As Needed For Nerves... 16)  Robaxin 500 Mg Tabs (Methocarbamol) .... Take 1 Tab By Mouth Three Times A Day As Needed For Muscle Spasm... 17)  Promethazine Vc/codeine 6.25-5-10 Mg/50ml Syrp (Phenyleph-Promethazine-Cod) .Marland Kitchen.. 1-2 Tsp Every 4-6 Hr As Needed Cough 18)  Promethazine Hcl 25 Mg Tabs (Promethazine Hcl) .... Take 1 Tablet By Mouth Every 6 Hours As Needed Nausea  Allergies (verified): 1)  !  Zaroxolyn (Metolazone)  Comments:  Nurse/Medical Assistant: The patient's medications and allergies were reviewed with the patient and were updated in the Medication and Allergy Lists.  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 she was 70y/o & he was 19... 3 children (born when she was 17,18,& 55 y/o) ex-smoker, quit 1986 social alcohol not exercising retired Interior and spatial designer  Review of Systems      See HPI  Vital Signs:  Patient profile:   70 year old female Weight:      267 pounds O2 Sat:      91 % on Room air Temp:     97 degrees F Pulse rate:   106 / minute BP sitting:   122 / 70  (right arm) Cuff size:   large  Vitals Entered By: Abigail Miyamoto RN (April 18, 2010 11:21 AM)  O2 Flow:  Room air  Physical Exam  Additional Exam:   GEN: A/Ox3; chronically ill appearing.  HEENT:  [place]/AT, , EACs-clear, TMs-wnl, NOSE-clear, THROAT-clear NECK:  Supple w/ fair ROM; no JVD; normal carotid impulses w/o bruits; no thyromegaly or nodules palpated; no lymphadenopathy. RESP  Coarse BS w/ faint exp wheezing  CARD:  RRR, no m/r/g   GI:   Soft & nt; nml bowel sounds; no organomegaly or masses detected. Musco: Warm bil,  no calf tenderness edema, clubbing, pulses intact, tender along left post back, mulitple trigger spots. nml grips.  Neuro: intact w/ no focal deficits noted.    Impression & Recommendations:  Problem # 1:   COPD (ICD-496)  Exacerbaton  Plan:  Avelox 400mg  once daily for 7days Prednisone taper over next week.  Mucinex DM two times a day as needed cough /congestion  Please contact office for sooner follow up if symptoms do not improve or worsen  follow up Dr. Kriste Basque as scheduled and as needed   Her updated medication list for this problem includes:    Symbicort 160-4.5 Mcg/act Aero (Budesonide-formoterol fumarate) .Marland Kitchen... 2 inhalations two times a day    Albuterol Sulfate (2.5 Mg/11ml) 0.083% Nebu (Albuterol sulfate) ..... Use in neb treatment up to four times a day as needed for wheezing...    Proventil Hfa 108 (90 Base) Mcg/act Aers (Albuterol sulfate) .Marland Kitchen... 1-2 sprays q6h as needed wheezing...    Mucinex Maximum Strength 1200 Mg Tb12 (Guaifenesin) .Marland Kitchen... 1 tab by mouth two times a day with fluids...    Promethazine Vc/codeine 6.25-5-10 Mg/28ml Syrp (Phenyleph-promethazine-cod) .Marland Kitchen... 1-2 tsp every 4-6 hr as needed cough    Avelox 400 Mg Tabs (Moxifloxacin hcl) .Marland Kitchen... 1 by mouth once daily  Medications Added to Medication List This Visit:  1)  Percocet 5-325 Mg Tabs (Oxycodone-acetaminophen) .... Take as directed by drrowe... 2)  Prednisone 10 Mg Tabs (Prednisone) .... 4 tabs for 2 days, then 3 tabs for 2 days, 2 tabs for 2 days, then 1 tab for 2 days, then stop 3)  Avelox 400 Mg Tabs (Moxifloxacin hcl) .Marland Kitchen.. 1 by mouth once daily  Complete Medication List: 1)  Symbicort 160-4.5 Mcg/act Aero (Budesonide-formoterol fumarate) .... 2 inhalations two times a day 2)  Albuterol Sulfate (2.5 Mg/74ml) 0.083% Nebu (Albuterol sulfate) .... Use in neb treatment up to four times a day as needed for wheezing... 3)  Proventil Hfa 108 (90 Base) Mcg/act Aers (Albuterol sulfate) .Marland Kitchen.. 1-2 sprays q6h as needed wheezing... 4)  Mucinex Maximum Strength 1200 Mg Tb12 (Guaifenesin) .Marland Kitchen.. 1 tab by mouth two times a day with fluids.Marland KitchenMarland Kitchen 5)  Coumadin 5 Mg Solr (Warfarin sodium) .... Take as dierected by drberry.... 6)  Isosorbide  Mononitrate Cr 30 Mg Xr24h-tab (Isosorbide mononitrate) .... Take one tablet by mouth once daily 7)  Metoprolol Tartrate 50 Mg Tabs (Metoprolol tartrate) .Marland Kitchen.. 1 tab by mouth two times a day... 8)  Furosemide 40 Mg Tabs (Furosemide) .Marland Kitchen.. 1 tab by mouth two times a day... 9)  Klor-con M20 20 Meq Tbcr (Potassium chloride crys cr) .Marland Kitchen.. 1 tab by mouth two times a day... 10)  Simvastatin 20 Mg Tabs (Simvastatin) .Marland Kitchen.. 1 tab by mouth at bedtime... 11)  Eq Omeprazole 20 Mg Tbec (Omeprazole) .... Take one tablet by mouth two times a day 12)  Neurontin 600 Mg Tabs (Gabapentin) .Marland Kitchen.. 1 tab by mouth four times a day as directed... 13)  Percocet 5-325 Mg Tabs (Oxycodone-acetaminophen) .... Take as directed by drrowe... 14)  Amitriptyline Hcl 50 Mg Tabs (Amitriptyline hcl) .... Take two tablets by mouth at bedtime 15)  Alprazolam 0.5 Mg Tabs (Alprazolam) .Marland Kitchen.. 1 tab by mouth qid as needed for nerves... 16)  Robaxin 500 Mg Tabs (Methocarbamol) .... Take 1 tab by mouth three times a day as needed for muscle spasm... 17)  Promethazine Vc/codeine 6.25-5-10 Mg/44ml Syrp (Phenyleph-promethazine-cod) .Marland Kitchen.. 1-2 tsp every 4-6 hr as needed cough 18)  Promethazine Hcl 25 Mg Tabs (Promethazine hcl) .... Take 1 tablet by mouth every 6 hours as needed nausea 19)  Prednisone 10 Mg Tabs (Prednisone) .... 4 tabs for 2 days, then 3 tabs for 2 days, 2 tabs for 2 days, then 1 tab for 2 days, then stop 20)  Avelox 400 Mg Tabs (Moxifloxacin hcl) .Marland Kitchen.. 1 by mouth once daily  Other Orders: Est. Patient Level IV (40102)  Patient Instructions: 1)  Avelox 400mg  once daily for 7days 2)  Prednisone taper over next week.  3)  Mucinex DM two times a day as needed cough /congestion  4)  Please contact office for sooner follow up if symptoms do not improve or worsen  5)  follow up Dr. Kriste Basque as scheduled and as needed  Prescriptions: AVELOX 400 MG TABS (MOXIFLOXACIN HCL) 1 by mouth once daily  #7 x 0   Entered and Authorized by:   Rubye Oaks NP   Signed by:   Donyel Castagnola NP on 04/18/2010   Method used:   [facility]  [address] [id number]       [address]       [place], [place]  [address]       Ph: [phone]  Fax: [phone]   RxID:   [id number] PREDNISONE 10 MG TABS (PREDNISONE) 4 tabs for 2 days, then 3 tabs for 2 days, 2 tabs for 2 days, then 1 tab for 2 days, then stop  #20 x 0   Entered and Authorized by:   Rubye Oaks NP   Signed by:   Cande Mastropietro NP on 04/18/2010   Method used:   [facility]        [facility]  [address] [id number]       [address]       [place], [place]  [address]       Ph: [phone]       Fax: [phone]   RxID:   [id number]

## 2010-08-19 NOTE — Progress Notes (Signed)
Summary: form/ surgery clearance  Phone Note Call from Patient Call back at Home Phone [phone]   Caller: Spouse-CARL Call For: NADEL Summary of Call: pt's spouse dropped off a form re: surgery clearance ([facility]). wants this signed by [name] and asks to pls note that pt has never been treated for MRSA per spouse (this is circled on form for some reason). pt told them that she had had the SHINGLES. fax to: [phone] attn: [facility] dept. I have given this form to leigh Initial call taken by: Tivis Ringer, CNA,  April 30, 2010 3:37 PM  Follow-up for Phone Call        ( another phone note was taken regarding this same matter...therefore I copied all the info from the other phone note and transferred here for less confusion)   Caller: Patient Call For: nadel Summary of Call: calling about letter from [facility] that was left for dr nadel to fill out. Initial call taken by: Rickard Patience,  May 01, 2010 1:30 PM  Additional Follow-up for Phone Call Additional follow up Details #1::        called and spoke with pt.  pt states she is just calling to check on the status of the paperwork she dropped of yesterday.  Would like this filled out asap.  Will forward message to SN.  Aundra Millet Reynolds LPN  May 01, 2010 2:07 PM     Additional Follow-up for Phone Call Additional follow up Details #2::    called and spoke with pt and she is aware that this  form has been faxed back to [facility] and has cleared her for cataract surgery. Randell Loop [facility]  May 01, 2010 3:23 PM

## 2010-08-19 NOTE — Progress Notes (Signed)
Summary: needs referral/ mamogram  Phone Note Call from Patient Call back at Home Phone [phone]   Caller: Patient Call For: [name] Summary of Call: [facility]. she also told them that she has some "left breat soreness" and was told to call the breast center on [address] to schedule mamogram there. when she called breast center she was told that she needs to have dr nade call for a referral.  Initial call taken by: Tivis Ringer, CNA,  February 10, 2010 10:30 AM  Follow-up for Phone Call        order has been sent for pt to set up with [facility] and spoke with pt and she is aware of order sent for this to be set up.  pt voiced her understanding. Randell Loop [facility]  February 10, 2010 11:23 AM

## 2010-08-19 NOTE — Letter (Signed)
Summary: [facility]  [facility]   Imported By: Sherian Rein 05/12/2010 17:17:02  _____________________________________________________________________  External Attachment:    Type:   Image     Comment:   External Document

## 2010-08-19 NOTE — Progress Notes (Signed)
Summary: phenergan rx  Phone Note Call from Patient   Caller: Patient Summary of Call: spoke with pt.  Pt states she had knee surgery in January and is taking pain meds.  The pain meds make her nauseated.  Requesting phenergan. Will forward to SN-please advise. Initial call taken by: Gweneth Dimitri RN,  August 23, 2009 3:46 PM  Follow-up for Phone Call        Per SN-ok for phenergan 25mg  1 tablet by mouth q6h as needed nausea #30 x0.  Gweneth Dimitri RN  August 23, 2009 3:47 PM   pt aware med sent to [facility].  Gweneth Dimitri RN  August 23, 2009 3:49 PM      New/Updated Medications: PROMETHAZINE HCL 25 MG TABS (PROMETHAZINE HCL) take 1 tablet by mouth every 6 hours as needed nausea Prescriptions: PROMETHAZINE HCL 25 MG TABS (PROMETHAZINE HCL) take 1 tablet by mouth every 6 hours as needed nausea  #30 x 0   Entered by:   Gweneth Dimitri RN   Authorized by:   Michele Mcalpine MD   Signed by:   Gweneth Dimitri RN on 08/23/2009   Method used:   [facility]        [facility]  [address] [phone][facility] (retail)       [address]       [place], [place]  [address]       Ph: [phone]       Fax: [phone]   RxID:   [id number]

## 2010-08-19 NOTE — Progress Notes (Signed)
Summary: cough > augmentin rx  Phone Note Call from Patient Call back at Home Phone [phone]   Caller: Patient Call For: nadel Reason for Call: Talk to Nurse Summary of Call: Patient requesting abx.  She says she has been coughing up green sputum.  [facility] [address] Initial call taken by: Lehman Prom,  May 28, 2010 10:51 AM  Follow-up for Phone Call        The patient says she has been coughing up green mucus x2-3 days. She denies any sob, sinus pain/pressure, fever, wheezing or sore throat. She was concerned because she is scheduled for cataract surgery on Monday 06/02/2010. She notices more mucus after her nebulizer reatments. Please advise.Michel Bickers [facility]  May 28, 2010 11:06 AM  Additional Follow-up for Phone Call Additional follow up Details #1::        per SN---if pt is not pcn allergic ok for her to have augmentin 875mg  #14   1 by mouth two times a day until gone. thanks Randell Loop CMA  May 28, 2010 2:02 PM  Called, spoke with pt.  Pt not aware of any PCN allergy.  Informed her of above recs per SN, [facility] [address]  Pt verbalized understanding.   Additional Follow-up by: Gweneth Dimitri RN,  May 28, 2010 2:11 PM    New/Updated Medications: AUGMENTIN 875-125 MG TABS (AMOXICILLIN-POT CLAVULANATE) Take 1 tablet by mouth two times a day until gone Prescriptions: AUGMENTIN 875-125 MG TABS (AMOXICILLIN-POT CLAVULANATE) Take 1 tablet by mouth two times a day until gone  #14 x 0   Entered by:   Gweneth Dimitri RN   Authorized by:   Michele Mcalpine MD   Signed by:   Gweneth Dimitri RN on 05/28/2010   Method used:   [facility]        [facility]  [address] [phone] ([facility]ail)       [address]       [place], [place]  [address]       Ph: [phone]       Fax: [phone]   RxID:   [id number]

## 2010-08-19 NOTE — Letter (Signed)
Summary: [facility]  [facility]   Imported By: Sherian Rein 02/19/2010 07:56:04  _____________________________________________________________________  External Attachment:    Type:   Image     Comment:   External Document

## 2010-08-19 NOTE — Assessment & Plan Note (Signed)
Summary: rov 4 month///kp   CC:  4 month ROV & review of mult medical problems....  History of Present Illness: 70 y/o WF here for a follow up visit... she has ASHD w/ prev CABG and chr AFib on coumadin followed by DrBerry... she also has FM/ chr pain syndrome followed by [name]... we have followed her primarily for COPD/ Asthmatic Bronchitis- see below...     ~  July 09, 2009:  recent AB exac that required Levaquin  & Pred to improve... we discussed the need to stay on her pulm regimen regularly & to use a controller med- SYMBICORT160- 2 sp Bid... she has also had several Ortho problems- [name] considering knee arthroscopy vs TKR;  continued back pain/ chr pain syndrome Rx'd by Jake Seats w/ Oxycodone + Neurontin, Robaxin, Amitriptyline...   ~ November 12, 2009:  she had knee surg 1/11 by [name]... then fell (tripped at home) 09/12/09 & hosp x4d> Xrays showed T4 compression... had f/u eval DrTooke w/ L2 compression, DDD, DJD> had epid steroid shots w/ coordination thru [facility] coumadin clinic, but developed large ecchymosis right post lumbar area... protime shows PT 67/ INR 6.6 & coumadin on hold per [facility]... we discussed the need for BMD testing & treatment> she will pursue this thru [facility]... Her breathing has been good since we added the SYMBICORT160> no exac, no need for antibiotics or prednisone...   ~  March 12, 2010:  she was hosp 6/11 by Marland Mcalpine for CP> no change in cath from 2009 w/ patent grafts, & meds adjusted w/ IMDUR started and she is improved... she is c/o bladder issues (?OB symptoms + urge incontinence etc)> we discussed trial of TOVIAZ 4mg  to start and appt w/ Urology for futher eval... mult other somatic complaints as well but breathing is stable;  unfortunately not able to lose signif weight despite trials of diets etc...    Current Problem List:  COPD (ICD-496) - ex smoker, quit 32yrs... uses nebulizer w/ ALBUTEROL Qid (using 1-2 daily) vs Proventil HFA,  SYMBICORT 160- 2spBid & MUCINEX 1-2 Bid w/ fluids... baseline CXR shows prev CABG surg, borderline Cor, NAD... prev PFT's 12/05 showed more restriction (from effort and obesity) than obstruction...  ~  CXR 10/09 in hosp showed biapical pleural thickening, NAD.  ~  5/10:  bronchitic exas treated w/ Levaquin/ Medrol/ etc...  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  ~  12/10:  another exac requiring Levaquin/ Pred... we will add SYMBICORT160- 2sp Bid.  ~  4/11:  she reports doing well since the Symbicort was added...  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 s/p CABG in 1997...  on IMDUR 30mg /d, METOPROLOL 50mg Bid, LASIX 40mg Bid, and K20Bid...  ~  NuclearStressTests in 8/05 showing infer scar and mild inferolat ischemia...   ~  2DEcho 10/07 showed EF=45-50% and DD...   ~  repeat Myoview in 9/08 showed no ischemia...  ~  hosp 10/09 w/ CP- cath showed 90%proxLAD, normCIRC, subtotal midRCA, LIMA & 2 vein grafts patent;  LVF= 60% w/o regional wall motion abn... no change from 2004.  ~  repeat cath 6/11 showed no change from 2009 and patent grafts...  ATRIAL FIBRILLATION (ICD-427.31) - on COUMADIN per DrBerry...  ~  4/11: complic after epidural shot w/ large ecchymosis right lumbar area, PT INR was >6, managed by [facility].  VENOUS INSUFFICIENCY (ICD-459.81) - she follows low sodium diet. elevates legs, wears support hose occas... she is on LASIX 40mg Bid per Cards...  HYPERLIPIDEMIA (  ICD-272.4) - on ZOCOR 20mg /d...  ~  FLP 2/09 showed TChol 112, TG 76, HDL 43, LDL 54  ~  FLP 3/10 showed TChol 166, TG 137, HDL 40, LDL 98  ~  FLP 10/10 showed TChol 120, Tg 91, HDL 47, LDL 55  MORBID OBESITY (ICD-278.01) - we have discussed diet and exercise program on numerous occas in the past and again today!  ~  weight 2/10 = 250#  ~  weight 7/10 = 262#  ~  weight 8/10 = 245#  ~  weight 12/10 = 266# "it's a fighting battle"  ~  weight 4/11 = 262#  ~  weight 8/11 = 256#  GERD  (ICD-530.81) - on OMEPRAZOLE 20mg Bid... ?[facility] changed to Aciphex?  IBS (ICD-564.1) - last colonoscopy was 9/07 by Dorris Singh and showed hems only... 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  RENAL INSUFFICIENCY (ICD-588.9) - Creat in the 1.6-1.7 range...  ~  labs 2/09 showed BUN= 16, Creat= 1.6  ~  labs 3/10 showed BUN= 20, Creat= 1.7  ~  labs 4/11 showed BUN= 15, Creat= 1.6  ~  labs in hosp 6/11 showed BUN= 9, Creat= 1.3  LOW BACK PAIN, CHRONIC (ICD-724.2) - eval and treated by Mauri Pole et al  "they does it all now" w/ rechecks Q3-45months... she takes OXYCODONE 5/325, NEURONTIN 600mg Bid, etc... CHRONIC PAIN SYNDROME (ICD-338.4) FIBROMYALGIA (ICD-729.1) - on AMITRIPTYLINE 50mg - 2Qhs... 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  ANXIETY - on ALPRAZOLAM 0.5mg Qid...  HERPES ZOSTER (ICD-053.9), & POSTHERPETIC NEURALGIA (ICD-053.19) ** see above **   Preventive Screening-Counseling & Management  Alcohol-Tobacco     Smoking Status: quit  Allergies: 1)  ! Zaroxolyn (Metolazone)  Comments:  Nurse/Medical Assistant: The patient's medications and allergies were reviewed with the patient and were updated in the Medication and Allergy Lists.  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 MOTHER DECEASED AGE 64 FROM HEART PROBLEMS FATHER DECEASED AGE 71 HX OF HEART AND LUNG PROBLEMS 1 SIBLING ALIVE AGE 65  HX OF HEART AND LUNG PROBLEMS 1 SIBLING ALIVE AGE 3  HX OF KIDNEY PROBLEMS AND PARALYSIS 1 SIBLING ALIVE AGE 65  HX OF OBESITY AND HEART PROBLEMS  Social History: Reviewed history from 09/14/2008 and no changes required. Married, husb= Rockledge, 52 yrs... she was 70y/o & he was 19... 3 children (born when she was 17,18,& 19 y/o) ex-smoker, quit 1986 social alcohol not exercising retired Interior and spatial designer  Review of Systems      See HPI       The patient complains of decreased hearing, dyspnea on exertion, muscle weakness, and difficulty walking.  The patient denies anorexia, fever, weight loss, weight gain, vision loss, hoarseness, chest pain, syncope, peripheral edema, prolonged cough, headaches,  hemoptysis, abdominal pain, melena, hematochezia, severe indigestion/heartburn, hematuria, incontinence, suspicious skin lesions, transient blindness, depression, unusual weight change, abnormal bleeding, enlarged lymph nodes, and angioedema.    Vital Signs:  Patient profile:   70 year old female Height:      68 inches Weight:      256.13 pounds BMI:     39.09 O2 Sat:      95 % on Room air Temp:     96.3 degrees F oral Pulse rate:   105 / minute BP sitting:   120 / 76  (right arm) Cuff size:   large  Vitals Entered By: Randell Loop CMA (March 12, 2010 12:41 PM)  O2 Sat at Rest %:  95 O2 Flow:  Room air CC: 4 month ROV & review of mult medical problems... Is Patient Diabetic? Yes Pain Assessment Patient in pain? no      Comments meds updated today with pt and from hosp d/c sheet   Physical Exam  Additional  Exam:  WD, Obese, 70 y/o WF in NAD... GENERAL:  Alert & oriented; pleasant & cooperative... HEENT:  [place]/AT, EOM- full, PERRLA, EACs-clear, TMs-wnl, NOSE-clear, THROAT-clear & wnl. NECK:  Supple w/ fairROM; no JVD; normal carotid impulses w/o bruits; no thyromegaly or nodules palpated; no lymphadenopathy. CHEST:  Coarse BS w/ no wheezing, no rales, no signs of consolidation... HEART:  Regular Rhythm; without murmurs/ rubs/ or gallops heard... ABDOMEN:  Obese, soft & nontender; normal bowel sounds; no organomegaly or masses detected. EXT: without deformities, mild arthritic changes; no varicose veins/ +venous insuffic/ 1+ edema. NEURO:  no focal neuro deficits... DERM:  along right lateral chest wall, under breast & over the costal margin- scarring in skin from shingles rash...    MISC. Report  Procedure date:  03/12/2010  Findings:      DATAS REVIEWED:  ~  old EMR notes...  ~  [facility] records w/ DCSummary 01/08/10, Cath report, XRays & Scans, +labs etc...   Impression & Recommendations:  Problem # 1:  COPD (ICD-496) Stable>  continue meds. Her updated medication list for this problem includes:    Symbicort 160-4.5 Mcg/act Aero (Budesonide-formoterol fumarate) .Marland Kitchen... 2 inhalations two times a day    Albuterol Sulfate (2.5 Mg/19ml) 0.083% Nebu (Albuterol sulfate) ..... Use in neb treatment up to four times a day as needed for wheezing...    Proventil Hfa 108 (90 Base) Mcg/act Aers (Albuterol sulfate) .Marland Kitchen... 1-2 sprays q6h as needed wheezing...  Problem # 2:  ARTERIOSCLEROTIC HEART DISEASE (ICD-414.00) As per DrBerry> she is improved on the IMDUR... The following medications were removed from the medication list:    Cartia Xt 240 Mg Cp24 (Diltiazem hcl coated beads) .Marland Kitchen... 1 tab by mouth once daily... Her updated medication list for this problem includes:    Isosorbide Mononitrate Cr 30 Mg Xr24h-tab (Isosorbide mononitrate) .Marland Kitchen... Take one tablet by mouth once daily    Metoprolol Tartrate 50  Mg Tabs (Metoprolol tartrate) .Marland Kitchen... 1 tab by mouth two times a day...    Furosemide 40 Mg Tabs (Furosemide) .Marland Kitchen... 1 tab by mouth two times a day...  Problem # 3:  ATRIAL FIBRILLATION (ICD-427.31) Remains on Coumadin pwer [facility]... The following medications were removed from the medication list:    Cartia Xt 240 Mg Cp24 (Diltiazem hcl coated beads) .Marland Kitchen... 1 tab by mouth once daily... Her updated medication list for this problem includes:    Coumadin 5 Mg Solr (Warfarin sodium) .Marland Kitchen... Take as dierected by drberry....    Metoprolol Tartrate  50 Mg Tabs (Metoprolol tartrate) .Marland Kitchen... 1 tab by mouth two times a day...  Problem # 4:  HYPERLIPIDEMIA (ICD-272.4) Stable on Zocor... needs better diet. Her updated medication list for this problem includes:    Simvastatin 20 Mg Tabs (Simvastatin) .Marland Kitchen... 1 tab by mouth at bedtime...  Problem # 5:  MORBID OBESITY (ICD-278.01) We discussed diet + exercise program needed to lose weight...  Problem # 6:  LOW BACK PAIN, CHRONIC (ICD-724.2) Followed by Ortho, Rheum, Pain Clinic... Her updated medication list for this problem includes:    Percocet 5-325 Mg Tabs (Oxycodone-acetaminophen) .Marland Kitchen... Take as directed by drrowe...    Robaxin 500 Mg Tabs (Methocarbamol) .Marland Kitchen... Take 1 tab by mouth three times a day as needed for muscle spasm...  Complete Medication List: 1)  Symbicort 160-4.5 Mcg/act Aero (Budesonide-formoterol fumarate) .... 2 inhalations two times a day 2)  Albuterol Sulfate (2.5 Mg/2ml) 0.083% Nebu (Albuterol sulfate) .... Use in neb treatment up to four times a day as needed for wheezing... 3)  Proventil Hfa 108 (90 Base) Mcg/act Aers (Albuterol sulfate) .Marland Kitchen.. 1-2 sprays q6h as needed wheezing... 4)  Mucinex Maximum Strength 1200 Mg Tb12 (Guaifenesin) .Marland Kitchen.. 1 tab by mouth two times a day with fluids.Marland KitchenMarland Kitchen 5)  Coumadin 5 Mg Solr (Warfarin sodium) .... Take as dierected by drberry.... 6)  Isosorbide Mononitrate Cr 30 Mg Xr24h-tab (Isosorbide mononitrate) ....  Take one tablet by mouth once daily 7)  Metoprolol Tartrate 50 Mg Tabs (Metoprolol tartrate) .Marland Kitchen.. 1 tab by mouth two times a day... 8)  Furosemide 40 Mg Tabs (Furosemide) .Marland Kitchen.. 1 tab by mouth two times a day... 9)  Klor-con M20 20 Meq Tbcr (Potassium chloride crys cr) .Marland Kitchen.. 1 tab by mouth two times a day... 10)  Simvastatin 20 Mg Tabs (Simvastatin) .Marland Kitchen.. 1 tab by mouth at bedtime... 11)  Eq Omeprazole 20 Mg Tbec (Omeprazole) .... Take one tablet by mouth two times a day 12)  Neurontin 600 Mg Tabs (Gabapentin) .Marland Kitchen.. 1 tab by mouth four times a day as directed... 13)  Percocet 5-325 Mg Tabs (Oxycodone-acetaminophen) .... Take as directed by drrowe... 14)  Amitriptyline Hcl 50 Mg Tabs (Amitriptyline hcl) .... Take two tablets by mouth at bedtime 15)  Alprazolam 0.5 Mg Tabs (Alprazolam) .Marland Kitchen.. 1 tab by mouth qid as needed for nerves... 16)  Robaxin 500 Mg Tabs (Methocarbamol) .... Take 1 tab by mouth three times a day as needed for muscle spasm... 17)  Promethazine Vc/codeine 6.25-5-10 Mg/57ml Syrp (Phenyleph-promethazine-cod) .Marland Kitchen.. 1-2 tsp every 4-6 hr as needed cough 18)  Promethazine Hcl 25 Mg Tabs (Promethazine hcl) .... Take 1 tablet by mouth every 6 hours as needed nausea 19)  Toviaz 4 Mg Xr24h-tab (Fesoterodine fumarate) .... Take 1 tab by mouth once daily  Other Orders: Urology Referral (Urology)  Patient Instructions: 1)  Today we updated your med list- see below.... 2)  Continue your current meds the same... 3)  We added TOVIAZ 4mg  take 1 tab at bedtime to help your urinary symptoms.Marland KitchenMarland Kitchen 4)  We will arrange for a Urology consult as well... 5)  Call for any questions.Marland KitchenMarland Kitchen 6)  Please schedule a follow-up appointment in 4 months.

## 2010-08-19 NOTE — Progress Notes (Signed)
Summary: avelox too expensive   Phone Note From Pharmacy Call back at [phone]   Caller: [facility]  [address] [id number]*//jessica Call For: nadel  Summary of Call: States that avelox 400mg  is too expensive for pt, needs alternative, pls advise. Initial call taken by: Darletta Moll,  April 18, 2010 1:09 PM  Follow-up for Phone Call        Pt was just seen by TP today and given rx for Avelox.  Too expensive.  Pt requests something cheaper.  Please advise.  Thank you,.  Aundra Millet Reynolds LPN  April 18, 2010 1:25 PM   Additional Follow-up for Phone Call Additional follow up Details #1::        levaquin 500mg  once daily #7 no refills  Please contact office for sooner follow up if symptoms do not improve or worsen  Additional Follow-up by: Tammy Parrett NP,  April 18, 2010 1:42 PM    Additional Follow-up for Phone Call Additional follow up Details #2::    called and spoke with Shanda Bumps at [facility] and was informed pt "didn't want to wait on a response from [place] and therefore just paid for the Avelox instead."  So Levaquin was NOT called into pharmacy.  Nothing further needed.  Aundra Millet Reynolds LPN  April 18, 2010 1:45 PM

## 2010-08-19 NOTE — Assessment & Plan Note (Signed)
Summary: 4 MONTHS/APC   CC:  4 month ROV & post hosp check....  History of Present Illness: 70 y/o WF here for a follow up visit... she has ASHD w/ prev CABG and chr AFib on coumadin followed by DrBerry... she also has FM/ chr pain syndrome followed by [name]... we have followed her primarily for COPD/ Asthmatic Bronchitis- see below...   ~  Hosp Oct09 by Marland Mcalpine w/ CP- abn Myoview, subseq cath w/ patent graphs & Myoview was felt to be a false positive... she has chr AFib w/ some nocturnal pauses and Cartia dose decr to 120mg ...  ~  Feb10:  she has done well over the last few months & has no new complaints or concerns today other than constipation- we discussed adding Miralax & Senakot-S to her regimen...   ~  Jul10:  about 1 month ago she developed right T8-9 shingles & saw TP w/ Valtrex & Medrol perscribed... she never took the Valtrex due to $$, and returned w/ improved rash but persist pain c/w post herpetic neuralgia... she is already on Neurontin 600Bid & Percocet from [facility] for chr back pain/ FM/ Paget's... returns today w/ increased pain in the right T8-9 distrib- exam shows scarring in the skin- sl tender to touch- etc... we decided to treat w/ Acyclovir, Medrol, & increase her Neurontin... I explained to her that the next step would be Neurolgy or Pain Clinic.  ~  Aug10:  post herpetic neuralgia pain improved, but persists in right costal margin area... we will try Lidoderm patches... she wants muscle relaxer- Rx Robaxin 500Tid Prn... mult somatic complanits persist...  ~  Dec10:  recent AB exac that required Levaquin  & Pred to improve... we discussed the need to stay on her pulm regimen regularly & to use a controller med- SYMBICORT160- 2 sp Bid... she has also had several Ortho problems- [name] considering knee arthroscopy vs TKR;  continued back pain/ chr pain syndrome Rx'd by Jake Seats w/ Oxycodone + Neurontin, Robaxin, Amitriptyline...   ~ November 12, 2009:  she had knee  surg 1/11 by [name]... then fell (tripped at home) 09/12/09 & hosp x4d> Xrays showed T4 compression... had f/u eval DrTooke w/ L2 compression, DDD, DJD> had epid steroid shots w/ coordination thru [facility] coumadin clinic, but developed large ecchymosis right post lumbar area... protime shows PT 67/ INR 6.6 & coumadin on hold per [facility]... we discussed the need for BMD testing & treatment> she will pursue this thru [facility]... Her breathing has been good since we added the SYMBICORT160> no exac, no need for antibiotics or prednisone...   Current Problem List:  COPD (ICD-496) - ex smoker, quit 40yrs... uses nebulizer w/ ALBUTEROL Qid (using 1-2 daily) vs Proventil HFA, SYMBICORT 160- 2spBid & MUCINEX 1-2 Bid w/ fluids... baseline CXR shows prev CABG surg, borderline Cor, NAD... prev PFT's 12/05 showed more restriction (from effort and obesity) than obstruction...  ~  CXR 10/09 in hosp showed biapical pleural thickening, NAD.  ~  5/10:  bronchitic exas treated w/ Levaquin/ Medrol/ etc...  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  ~  12/10:  another exac requiring Levaquin/ Pred... we will add SYMBICORT160- 2sp Bid.  ~  4/11:  she reports doing well since the Symbicort was added...  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 s/p CABG in 1997...  stable on CARTIA XT 120mg /d, METOPROLOL 50mg Bid, LASIX 40mg Bid, and K20Bid...  ~  NuclearStressTests in 8/05 showing infer scar and mild inferolat  ischemia...   ~  2DEcho 10/07 showed EF=45-50% and DD...   ~  repeat Myoview in 9/08 showed no ischemia...  ~  hosp 10/09 w/ CP- cath showed 90%proxLAD, normCIRC, subtotal midRCA, LIMA & 2 vein grafts patent;  LVF= 60% w/o regional wall motion abn... no change from 2004.  ATRIAL FIBRILLATION (ICD-427.31) - on COUMADIN per DrBerry...  ~  4/11: complic after epidural shot w/ large ecchymosis right lumbar area, PT INR was >6, managed by [facility].  VENOUS INSUFFICIENCY (ICD-459.81) - she  follows low sodium diet. elevates legs, wears support hose occas... she is on LASIX 40mg Bid per Cards...  HYPERLIPIDEMIA (ICD-272.4) - on ZOCOR 20mg /d...  ~  FLP 2/09 showed TChol 112, TG 76, HDL 43, LDL 54  ~  FLP 3/10 showed TChol 166, TG 137, HDL 40, LDL 98  ~  FLP 10/10 showed TChol 120, Tg 91, HDL 47, LDL 55  MORBID OBESITY (ICD-278.01) - we have discussed diet and exercise program on numerous occas in the past and again today!  ~  weight 2/10 = 250#  ~  weight 7/10 = 262#  ~  weight 8/10 = 245#  ~  weight 12/10 = 266# "it's a fighting battle"  ~  weight 4/11 = 262#  GERD (ICD-530.81) - on OMEPRAZOLE 20mg Bid... ?[facility] changed to Aciphex?  IBS (ICD-564.1) - last colonoscopy was 9/07 by Dorris Singh and showed hems only... 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  RENAL INSUFFICIENCY (ICD-588.9) - Creat in the 1.6-1.7 range...  ~  labs 2/09 showed BUN= 16, Creat= 1.6  ~  labs 3/10 showed BUN= 20, Creat= 1.7  ~  labs 4/11 showed BUN= 15, Creat= 1.6  LOW BACK PAIN, CHRONIC (ICD-724.2) - eval and treated by Mauri Pole et al  "they does it all now" w/ rechecks Q3-41months... she takes OXYCODONE 5/325, NEURONTIN 600mg Bid, etc... CHRONIC PAIN SYNDROME (ICD-338.4) FIBROMYALGIA (ICD-729.1) - on AMITRIPTYLINE 50mg - 2Qhs... PAGET'S DISEASE (ICD-731.0)  ~  labs 2/09 showed AlkPhos= 79  ~  labs 3/10 showed AlkPhos= 86  ~  labs 4/11 showed AlkPhos= 70  ANXIETY - on ALPRAZOLAM 0.5mg Qid...  HERPES ZOSTER (ICD-053.9), & POSTHERPETIC NEURALGIA (ICD-053.19) ** see above **   Allergies: 1)  ! Zaroxolyn (Metolazone)  Comments:  Nurse/Medical Assistant: The patient's medications and allergies were reviewed with the patient and were updated in the Medication and Allergy Lists.  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 MOTHER DECEASED AGE 46 FROM HEART PROBLEMS FATHER DECEASED AGE 70 HX OF HEART AND LUNG PROBLEMS 1 SIBLING ALIVE AGE 76  HX OF HEART AND LUNG PROBLEMS 1 SIBLING ALIVE AGE 71  HX OF KIDNEY PROBLEMS AND PARALYSIS 1 SIBLING ALIVE AGE 64  HX OF OBESITY AND HEART PROBLEMS  Social History: Reviewed history from 09/14/2008 and no changes required. Married, husb= [place], 52 yrs... she was 70y/o & he was 19... 3 children (born when she was 17,18,& 5 y/o) ex-smoker, quit 1986 social alcohol not exercising retired Interior and spatial designer  Review of Systems  See HPI       The patient complains of dyspnea on exertion, muscle weakness, difficulty walking, and abnormal bleeding.  The patient denies anorexia, fever, weight loss, weight gain, vision loss, decreased hearing, hoarseness, chest pain, syncope, peripheral edema, prolonged cough, headaches, hemoptysis, abdominal pain, melena, hematochezia, severe indigestion/heartburn, hematuria, incontinence, suspicious skin lesions, transient blindness, depression, unusual weight change, enlarged lymph nodes, and angioedema.    Vital Signs:  Patient profile:   70 year old female Height:      68 inches Weight:      261.38 pounds BMI:     39.89 O2 Sat:      95 % on Room air Temp:     97.3 degrees F oral Pulse rate:   80 / minute BP sitting:   132 / 80  (left arm) Cuff size:   large  Vitals Entered By: Randell Loop CMA (November 12, 2009 1:57 PM)  O2 Sat at Rest %:  95 O2 Flow:  Room air CC: 4 month ROV & post hosp check... Is Patient Diabetic? No Pain Assessment Patient in pain? no      Comments meds updated today   Physical Exam  Additional Exam:  WD, Obese, 70 y/o WF in NAD... GENERAL:  Alert & oriented; pleasant & cooperative... HEENT:  [place]/AT, EOM- full, PERRLA, EACs-clear, TMs-wnl, NOSE-clear, THROAT-clear & wnl. NECK:  Supple w/ fairROM; no JVD; normal carotid impulses w/o bruits; no thyromegaly or nodules palpated; no lymphadenopathy. CHEST:  Coarse BS w/ no wheezing, no rales, no signs of consolidation... HEART:  Regular Rhythm; without murmurs/ rubs/ or gallops heard... ABDOMEN:  Obese, soft & nontender; normal bowel sounds; no organomegaly or masses detected. EXT: without deformities, mild arthritic changes; no varicose veins/ +venous insuffic/ 1+ edema. BACK:  large bruise/ ecchymosis right lumbar area... NEURO:  no focal neuro deficits... DERM:  along right lateral chest wall, under breast & over the costal margin- scarring in skin from shingles rash...    MISC. Report  Procedure date:  11/12/2009  Findings:      BMP (METABOL)   Sodium                    141 mEq/L                   135-145   Potassium                 4.2 mEq/L                   3.5-5.1   Chloride                  98 mEq/L                    96-112   Carbon Dioxide       [H]  35 mEq/L                    19-32   Glucose              [H]  109 mg/dL                   19-14   BUN                       15 mg/dL  6-23   Creatinine           [H]  1.6 mg/dL                   8.1-1.9   Calcium                   9.3 mg/dL                   1.4-78.2   GFR                       34.06 mL/min                >60  Hepatic/Liver Function Panel (HEPATIC)   Total Bilirubin           1.0 mg/dL                   9.5-6.2   Direct Bilirubin          0.1 mg/dL                   1.3-0.8   Alkaline  Phosphatase      70 U/L                      39-117   AST                       19 U/L                      0-37   ALT                       19 U/L                      0-35   Total Protein             6.8 g/dL                    6.5-7.8   Albumin                   4.3 g/dL                    4.6-9.6  CBC Platelet w/Diff (CBCD)   White Cell Count          7.5 K/uL                    4.5-10.5   Red Cell Count       [L]  3.50 Mil/uL                 3.87-5.11   Hemoglobin           [L]  11.6 g/dL                   29.5-28.4   Hematocrit           [L]  33.8 %                      36.0-46.0   MCV                       96.6 fl  78.0-100.0   Platelet Count       [L]  146.0 K/uL                  150.0-400.0   Neutrophil %              55.0 %                      43.0-77.0   Lymphocyte %              34.4 %                      12.0-46.0   Monocyte %                8.4 %                       3.0-12.0   Eosinophils%              1.5 %                       0.0-5.0   Basophils %               0.7 %                       0.0-3.  Comments:      TSH (TSH)   FastTSH                   2.33 uIU/mL                 0.35-5.50  IBC Panel (IBC)   Iron                      118 ug/dL                   11-914   Transferrin               253.8 mg/dL                 782.9-562.1   Iron Saturation           33.2 %                      20.0-50.0  PT (PTP)   Prothrombin Time     [HH] 67.2 sec                    9.1-11.7   INR                  [HH] 6.6 ratio                   0.8-1.0  Vitamin D (25-Hydroxy) (30865)  Vitamin D (25-Hydroxy)                             52 ng/mL                    30-89   Impression & Recommendations:  Problem # 1:  COPD (ICD-496) She appears to have improved/ stabilized w/ the Symbicort addition... continue Rx. Her updated medication list for this problem includes:    Symbicort 160-4.5 Mcg/act Aero (Budesonide-formoterol fumarate) .Marland Kitchen... 2 inhalations two  times a day  Albuterol Sulfate (2.5 Mg/7ml) 0.083% Nebu (Albuterol sulfate) ..... Use in neb treatment up to four times a day as needed for wheezing...    Proventil Hfa 108 (90 Base) Mcg/act Aers (Albuterol sulfate) .Marland Kitchen... 1-2 sprays q6h as needed wheezing...  Problem # 2:  ARTERIOSCLEROTIC HEART DISEASE (ICD-414.00) Followed by Merrilee Jansky al at [facility]... continue same meds. Her updated medication list for this problem includes:    Metoprolol Tartrate 50 Mg Tabs (Metoprolol tartrate) .Marland Kitchen... 1 tab by mouth two times a day...    Cartia Xt 240 Mg Cp24 (Diltiazem hcl coated beads) .Marland Kitchen... 1 tab by mouth once daily...    Furosemide 40 Mg Tabs (Furosemide) .Marland Kitchen... 1 tab by mouth two times a day...  Problem # 3:  ATRIAL FIBRILLATION (ICD-427.31) She is on COUMADIN for AFib via [facility]... they are aware of prolonged protime & have held her coumadin & following. Her updated medication list for this problem includes:    Coumadin 5 Mg Solr (Warfarin sodium) .Marland Kitchen... Take as dierected by drberry....    Metoprolol Tartrate 50 Mg Tabs (Metoprolol tartrate) .Marland Kitchen... 1 tab by mouth two times a day...    Cartia Xt 240 Mg Cp24 (Diltiazem hcl coated beads) .Marland Kitchen... 1 tab by mouth once daily...  Orders: TLB-BMP (Basic Metabolic Panel-BMET) (80048-METABOL) TLB-Hepatic/Liver Function Pnl (80076-HEPATIC) TLB-CBC Platelet - w/Differential (85025-CBCD) TLB-TSH (Thyroid Stimulating Hormone) (84443-TSH) TLB-IBC Pnl (Iron/FE;Transferrin) (83550-IBC) TLB-PT (Protime) (85610-PTP) T-Vitamin D (25-Hydroxy) (56213-08657)  Problem # 4:  HYPERLIPIDEMIA (ICD-272.4) Continue same med... Her updated medication list for this problem includes:    Simvastatin 20 Mg Tabs (Simvastatin) .Marland Kitchen... 1 tab by mouth at bedtime...  Problem # 5:  MORBID OBESITY (ICD-278.01) Weight reduction is the key...  Problem # 6:  GERD (ICD-530.81) GI is stable-  same meds. Her updated medication list for this problem includes:    Eq Omeprazole 20 Mg Tbec  (Omeprazole) .Marland Kitchen... Take one tablet by mouth two times a day  Problem # 7:  RENAL INSUFFICIENCY (ICD-588.9) GU is stable w/ stable RI...  Problem # 8:  CHRONIC PAIN SYNDROME (ICD-338.4) She has been well cared [facility] by [facility] xyrs and now [facility] thru his office...  on Oxycodone, Neurontin, Amitrip...  Problem # 9:  ANXIETY (ICD-300.00) Continue same meds... Her updated medication list for this problem includes:    Amitriptyline Hcl 50 Mg Tabs (Amitriptyline hcl) .Marland Kitchen... Take two tablets by mouth at bedtime    Alprazolam 0.5 Mg Tabs (Alprazolam) .Marland Kitchen... 1 tab by mouth qid as needed for nerves...  Problem # 10:  OTHER MEDICAL ISSUES AS NOTED>>> We discussed the need for BMD testing & treatment> she wants to pursue this via [facility] but I told her if not done by the time of her ROV then we would proceed w/ the testing...  Complete Medication List: 1)  Symbicort 160-4.5 Mcg/act Aero (Budesonide-formoterol fumarate) .... 2 inhalations two times a day 2)  Albuterol Sulfate (2.5 Mg/31ml) 0.083% Nebu (Albuterol sulfate) .... Use in neb treatment up to four times a day as needed for wheezing... 3)  Proventil Hfa 108 (90 Base) Mcg/act Aers (Albuterol sulfate) .Marland Kitchen.. 1-2 sprays q6h as needed wheezing... 4)  Mucinex Maximum Strength 1200 Mg Tb12 (Guaifenesin) .Marland Kitchen.. 1 tab by mouth two times a day with fluids.Marland KitchenMarland Kitchen 5)  Coumadin 5 Mg Solr (Warfarin sodium) .... Take as dierected by drberry.... 6)  Metoprolol Tartrate 50 Mg Tabs (Metoprolol tartrate) .Marland Kitchen.. 1 tab by mouth two times a day... 7)  Cartia Xt 240 Mg Cp24 (Diltiazem hcl  coated beads) .Marland Kitchen.. 1 tab by mouth once daily.Marland KitchenMarland Kitchen 8)  Furosemide 40 Mg Tabs (Furosemide) .Marland Kitchen.. 1 tab by mouth two times a day... 9)  Klor-con M20 20 Meq Tbcr (Potassium chloride crys cr) .Marland Kitchen.. 1 tab by mouth two times a day... 10)  Simvastatin 20 Mg Tabs (Simvastatin) .Marland Kitchen.. 1 tab by mouth at bedtime... 11)  Eq Omeprazole 20 Mg Tbec (Omeprazole) .... Take one tablet by mouth two times a  day 12)  Neurontin 600 Mg Tabs (Gabapentin) .Marland Kitchen.. 1 tab by mouth four times a day as directed... 13)  Percocet 5-325 Mg Tabs (Oxycodone-acetaminophen) .... Take as directed by drrowe... 14)  Amitriptyline Hcl 50 Mg Tabs (Amitriptyline hcl) .... Take two tablets by mouth at bedtime 15)  Alprazolam 0.5 Mg Tabs (Alprazolam) .Marland Kitchen.. 1 tab by mouth qid as needed for nerves... 16)  Robaxin 500 Mg Tabs (Methocarbamol) .... Take 1 tab by mouth three times a day as needed for muscle spasm... 17)  Promethazine Vc/codeine 6.25-5-10 Mg/40ml Syrp (Phenyleph-promethazine-cod) .Marland Kitchen.. 1-2 tsp every 4-6 hr as needed cough 18)  Promethazine Hcl 25 Mg Tabs (Promethazine hcl) .... Take 1 tablet by mouth every 6 hours as needed nausea  Patient Instructions: 1)  Today we updated your med list- see below.... 2)  Continue your current breathing medications the same... 3)  Be sure to address the need for bone density test & treatment w/ DrBeekman... 4)  Today we did your follow up non-fasting blood work... please call the "phone tree" in a few days for your lab results.Marland KitchenMarland Kitchen 5)  Let's plan a follow up visit in  4 months, sooner as needed.

## 2010-08-21 ENCOUNTER — Telehealth (INDEPENDENT_AMBULATORY_CARE_PROVIDER_SITE_OTHER): Payer: Self-pay | Admitting: *Deleted

## 2010-08-21 NOTE — Assessment & Plan Note (Signed)
Summary: 4 months/ mbw   CC:  4 month ROV & review of mult medical problems....  History of Present Illness: 70 y/o WF here for a follow up visit... she has ASHD w/ prev CABG and chr AFib on coumadin followed by DrBerry... she also has FM/ chr pain syndrome followed by [name]... we have followed her primarily for COPD/ Asthmatic Bronchitis- see below...   ~ November 12, 2009:  she had knee surg 1/11 by [name]... then fell (tripped at home) 09/12/09 & hosp x4d> Xrays showed T4 compression... had f/u eval DrTooke w/ L2 compression, DDD, DJD> had epid steroid shots w/ coordination thru [facility] coumadin clinic, but developed large ecchymosis right post lumbar area... protime shows PT 67/ INR 6.6 & coumadin on hold per [facility]... we discussed the need for BMD testing & treatment> she will pursue this thru [facility] (prev DrRowe, now [facility]). Her breathing has been good since we added the SYMBICORT160> no exac, no need for antibiotics or prednisone...   ~  March 12, 2010:  she was hosp 6/11 by Marland Mcalpine for CP> no change in cath from 2009 w/ patent grafts, & meds adjusted w/ IMDUR started and she is improved... she is c/o bladder issues (?OB symptoms + urge incontinence etc)> we discussed trial of TOVIAZ 4mg  to start and appt w/ Urology for futher eval... mult other somatic complaints as well but breathing is stable;  unfortunately not able to lose signif weight despite trials of diets etc...    ~  July 02, 2010:  92mo ROV c/o incr congestion, cough w/ green mucus, & wants Avelox... similar exac 9/11 treated by TP w/ Avelox, Pred, Mucinex & resolved... she states the Toviaz helped & she saw DrPeterson for Urology who tried something else but she likes the Toviaz better & requests refill...   Current Problem List:  COPD (ICD-496) - ex smoker, quit 24yrs... uses nebulizer w/ ALBUTEROL Qid (using 1-2 daily) vs Proventil HFA, SYMBICORT 160- 2spBid & MUCINEX 1-2 Bid w/ fluids... baseline CXR shows prev  CABG surg, borderline Cor, NAD... prev PFT's 12/05 showed more restriction (from effort and obesity) than obstruction...  ~  CXR 10/09 in hosp showed biapical pleural thickening, NAD.  ~  5/10:  bronchitic exas treated w/ Levaquin/ Medrol/ etc...  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  ~  12/10:  another exac requiring Levaquin/ Pred... we will add SYMBICORT160- 2sp Bid.  ~  Improved w/ addition of Symbicort, but still c/o occas exac requiring antibiotics etc...  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 s/p CABG in 1997...  on IMDUR 30mg /d, METOPROLOL 50mg Bid, LASIX 40mg Bid, and K20Bid...  ~  NuclearStressTests in 8/05 showing infer scar and mild inferolat ischemia...   ~  2DEcho 10/07 showed EF=45-50% and DD...   ~  repeat Myoview in 9/08 showed no ischemia...  ~  hosp 10/09 w/ CP- cath showed 90%proxLAD, normCIRC, subtotal midRCA, LIMA & 2 vein grafts patent;  LVF= 60% w/o regional wall motion abn... no change from 2004.  ~  repeat cath 6/11 showed no change from 2009 and patent grafts...  ATRIAL FIBRILLATION (ICD-427.31) - on COUMADIN per DrBerry...  ~  4/11: complic after epidural shot w/ large ecchymosis right lumbar area, PT INR was >6, managed by [facility].  VENOUS INSUFFICIENCY (ICD-459.81) - she follows low sodium diet. elevates legs, wears support hose occas... she is on LASIX 40mg Bid per Cards...  HYPERLIPIDEMIA (ICD-272.4) - on ZOCOR 20mg /d...  ~  FLP 2/09 showed  TChol 112, TG 76, HDL 43, LDL 54  ~  FLP 3/10 showed TChol 166, TG 137, HDL 40, LDL 98  ~  FLP 10/10 showed TChol 120, Tg 91, HDL 47, LDL 55  ~  12/11:  she is not fasting for today's OV...  MORBID OBESITY (ICD-278.01) - we have discussed diet and exercise program on numerous occas in the past and again today!  ~  weight 2/10 = 250#  ~  weight 7/10 = 262#  ~  weight 8/10 = 245#  ~  weight 12/10 = 266# "it's a fighting battle"  ~  weight 4/11 = 262#  ~  weight 8/11 = 256#  ~  weight  12/11 = 263#... we reviewed diet + exercise thereapy.  GERD (ICD-530.81) - on OMEPRAZOLE 20mg Bid... ?[facility] changed to Aciphex?  IBS (ICD-564.1) - last colonoscopy was 9/07 by Dorris Singh and showed hems only... 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  RENAL INSUFFICIENCY (ICD-588.9) - Creat in the 1.6-1.7 range...  ~  labs 2/09 showed BUN= 16, Creat= 1.6  ~  labs 3/10 showed BUN= 20, Creat= 1.7  ~  labs 4/11 showed BUN= 15, Creat= 1.6  ~  labs in hosp 6/11 showed BUN= 9, Creat= 1.3  OTHER SYMPTOMS INVOLVING URINARY SYSTEM (ICD-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 she responded to TOVIAZ 8mg  for overact bladder symptoms & requests refill...  LOW BACK PAIN, CHRONIC (ICD-724.2) - eval and treated by Tamala Julian, now DrBeekman> "they do it all now" w/ rechecks Q3-30months... she takes OXYCODONE 5/325, NEURONTIN 600mg Bid, etc... COMPRESSION FRACTURE (ICD-829.0) - found to have compression T12 & L2 4/11 treated by NS- DrTooke... CHRONIC PAIN SYNDROME (ICD-338.4) - managed by Eligha Bridegroom... 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  ~  labs 2/09 showed AlkPhos= 79  ~  labs 3/10 showed AlkPhos= 86  ~  labs 4/11 showed AlkPhos= 70  ~  labs 6/11 in hosp showed AlkPhos = 71  ~  Bone Scan 6/11 showed stable Paget's distal left tibia, compress fx L2, OA of both knees.  ANXIETY - on ALPRAZOLAM 0.5mg Qid...  Hx of HERPES ZOSTER (ICD-053.9), & POSTHERPETIC NEURALGIA (ICD-053.19)   Preventive Screening-Counseling & Management  Alcohol-Tobacco     Smoking Status: quit     Year Quit: 1986  Allergies: 1)  ! Zaroxolyn (Metolazone)  Comments:  Nurse/Medical Assistant: The patient's medications and allergies were reviewed with the patient and were updated in the Medication and Allergy Lists.  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 MOTHER DECEASED AGE 49 FROM HEART PROBLEMS FATHER DECEASED AGE 65 HX OF HEART AND LUNG PROBLEMS 1 SIBLING ALIVE AGE 4  HX OF HEART AND LUNG PROBLEMS 1 SIBLING ALIVE AGE 72  HX OF KIDNEY PROBLEMS AND PARALYSIS 1 SIBLING ALIVE AGE 84  HX OF OBESITY AND HEART PROBLEMS  Social History: Reviewed history from 03/12/2010 and  no changes required. Married, husb= Virgil, 52 yrs... she was 70y/o & he was 19... 3 children (born when she was 17,18,& 21 y/o) ex-smoker, quit 1986 social alcohol not exercising retired Interior and spatial designer  Review of Systems      See HPI       The patient complains of decreased hearing, dyspnea on exertion, headaches, incontinence, and depression.  The patient denies anorexia, fever, weight loss, weight gain, vision loss, hoarseness, chest pain, syncope, peripheral edema, prolonged cough, hemoptysis, abdominal pain, melena, hematochezia, severe indigestion/heartburn, hematuria, muscle weakness, suspicious skin lesions, transient blindness, difficulty walking, unusual weight change, abnormal bleeding, enlarged lymph nodes, and angioedema.    Vital Signs:  Patient profile:   70 year old female Height:      68  inches Weight:      262.50 pounds BMI:     40.06 O2 Sat:      96 % on Room air Temp:     97.2 degrees F oral Pulse rate:   104 / minute BP sitting:   128 / 84  (right arm) Cuff size:   regular  Vitals Entered By: Randell Loop CMA (July 02, 2010 10:34 AM)  O2 Sat at Rest %:  96 O2 Flow:  Room air CC: 4 month ROV & review of mult medical problems... Is Patient Diabetic? No Pain Assessment Patient in pain? no      Comments meds updated today with pt   Physical Exam  Additional Exam:  WD, Obese, 70 y/o WF in NAD... GENERAL:  Alert & oriented; pleasant & cooperative... HEENT:  [place]/AT, EOM- full, PERRLA, EACs-clear, TMs-wnl, NOSE-clear, THROAT-clear & wnl. NECK:  Supple w/ fairROM; no JVD; normal carotid impulses w/o bruits; no thyromegaly or nodules palpated; no lymphadenopathy. CHEST:  Coarse BS w/ no wheezing, no rales, no signs of consolidation... HEART:  Regular Rhythm; without murmurs/ rubs/ or gallops heard... ABDOMEN:  Obese, soft & nontender; normal bowel sounds; no organomegaly or masses detected. EXT: without deformities, mild arthritic changes; no varicose veins/ +venous insuffic/ 1+ edema. NEURO:  no focal neuro deficits... DERM:  along right lateral chest wall, under breast & over the costal margin- scarring in skin from shingles rash...    Impression & Recommendations:  Problem # 1:  COPD (ICD-496) Stable on meds>  she requests freq antibiotics, OK Avelox refill. Her updated medication list for this problem includes:    Albuterol Sulfate (2.5 Mg/38ml) 0.083% Nebu (Albuterol sulfate) ..... Use in neb treatment up to four times a day as needed for wheezing...    Proventil Hfa 108 (90 Base) Mcg/act Aers (Albuterol sulfate) .Marland Kitchen... 1-2 sprays q6h as needed wheezing...    Symbicort 160-4.5 Mcg/act Aero (Budesonide-formoterol fumarate) .Marland Kitchen... 2 inhalations two times a day  Problem # 2:  ARTERIOSCLEROTIC HEART DISEASE (ICD-414.00) Followed by Marland Mcalpine [facility] for CAD,  AFib, & stable- continue same meds. Her updated medication list for this problem includes:    Isosorbide Mononitrate Cr 30 Mg Xr24h-tab (Isosorbide mononitrate) .Marland Kitchen... Take one tablet by mouth once daily    Metoprolol Tartrate 50 Mg Tabs (Metoprolol tartrate) .Marland Kitchen... 1 tab by mouth two times a day...    Furosemide 40 Mg Tabs (Furosemide) .Marland Kitchen... 1 tab by mouth two times a day...  Problem # 3:  HYPERLIPIDEMIA (ICD-272.4) Stable on Simva20>  continue same. Her updated medication list for this problem includes:    Simvastatin 20 Mg Tabs (Simvastatin) .Marland Kitchen... 1 tab by mouth at bedtime...  Problem # 4:  MORBID OBESITY (ICD-278.01) She still  hasn't lost any weight & we reviewed her options re: diet, exercise, etc...  Problem # 5:  RENAL INSUFFICIENCY (ICD-588.9) GU is stable & she requests refill Toviaz for bladder sym[ptoms- OK.     Problem # 6:  LOW BACK PAIN, CHRONIC (ICD-724.2) LBP w/ compresssion fx, chr pain syndrome, need for BMD>  all managed by [facility] according to the pt... Her updated medication list for this problem includes:    Percocet 5-325 Mg Tabs (Oxycodone-acetaminophen) .Marland Kitchen... Take as directed by drrowe...    Robaxin 500 Mg Tabs (Methocarbamol) .Marland Kitchen... Take 1 tab by mouth three times a day as needed for muscle spasm...  Problem # 7:  OTHER MEDICAL PROBLEMS AS NOTED>>>  Complete Medication List: 1)  Albuterol Sulfate (2.5 Mg/59ml) 0.083% Nebu (Albuterol sulfate) .... Use in neb treatment up to four times a day as needed for wheezing... 2)  Proventil Hfa 108 (90 Base) Mcg/act Aers (Albuterol sulfate) .Marland Kitchen.. 1-2 sprays q6h as needed wheezing... 3)  Symbicort 160-4.5 Mcg/act Aero (Budesonide-formoterol fumarate) .... 2 inhalations two times a day 4)  Mucinex Maximum Strength 1200 Mg Tb12 (Guaifenesin) .Marland Kitchen.. 1 tab by mouth two times a day with fluids.Marland KitchenMarland Kitchen 5)  Coumadin 5 Mg Solr (Warfarin sodium) .... Take as dierected by drberry.... 6)  Isosorbide Mononitrate Cr 30 Mg Xr24h-tab  (Isosorbide mononitrate) .... Take one tablet by mouth once daily 7)  Metoprolol Tartrate 50 Mg Tabs (Metoprolol tartrate) .Marland Kitchen.. 1 tab by mouth two times a day... 8)  Furosemide 40 Mg Tabs (Furosemide) .Marland Kitchen.. 1 tab by mouth two times a day... 9)  Klor-con M20 20 Meq Tbcr (Potassium chloride crys cr) .Marland Kitchen.. 1 tab by mouth two times a day... 10)  Simvastatin 20 Mg Tabs (Simvastatin) .Marland Kitchen.. 1 tab by mouth at bedtime... 11)  Eq Omeprazole 20 Mg Tbec (Omeprazole) .... Take one tablet by mouth two times a day 12)  Percocet 5-325 Mg Tabs (Oxycodone-acetaminophen) .... Take as directed by drrowe... 13)  Neurontin 600 Mg Tabs (Gabapentin) .Marland Kitchen.. 1 tab by mouth four times a day as directed... 14)  Amitriptyline Hcl 50 Mg Tabs (Amitriptyline hcl) .... Take two tablets by mouth at bedtime 15)  Robaxin 500 Mg Tabs (Methocarbamol) .... Take 1 tab by mouth three times a day as needed for muscle spasm... 16)  Alprazolam 0.5 Mg Tabs (Alprazolam) .Marland Kitchen.. 1 tab by mouth qid as needed for nerves... 17)  Promethazine Hcl 25 Mg Tabs (Promethazine hcl) .... Take 1 tablet by mouth every 6 hours as needed nausea 18)  Avelox 400 Mg Tabs (Moxifloxacin hcl) .... Take 1 tab by mouth once daily til gone... 19)  Toviaz 8 Mg Xr24h-tab (Fesoterodine fumarate) .... Take one tab by mouth once daily for bladder symptoms...  Patient Instructions: 1)  Today we updated your med list- see below.... 2)  Continue your current meds the same.... 3)  We wrote a new perscrition for AVELOX to take one daily til gone for the bronchitis.Marland KitchenMarland Kitchen 4)  We also wrot a perscription for Toviaz to take one daily for your bladder symptoms.Marland KitchenMarland Kitchen 5)  We also gave you the 2011 Flu vaccine today... 6)  Let'sd plan a follow up visit in 6 months w/ FASTING blood work at that time... Prescriptions: TOVIAZ 8 MG XR24H-TAB (FESOTERODINE FUMARATE) take one tab by mouth once daily for bladder symptoms...  #30 x 12   Entered and Authorized by:   Michele Mcalpine MD   Signed by:    Michele Mcalpine MD on 07/02/2010  Method used:   Print then Give to Patient   RxID:   [id number] AVELOX 400 MG TABS (MOXIFLOXACIN HCL) take 1 tab by mouth once daily til gone...  #7 x 2   Entered and Authorized by:   Michele Mcalpine MD   Signed by:   Michele Mcalpine MD on 07/02/2010   Method used:   Print then Give to Patient   RxID:   [id number]

## 2010-08-21 NOTE — Progress Notes (Signed)
Summary: reaction- flu shot  Phone Note Call from Patient Call back at Home Phone [phone]   Caller: Patient Call For: NADEL Summary of Call: arm where pt recieved flu shot has a red "raised bump the size of a 50 cent piece". please advise.  Initial call taken by: Tivis Ringer, CNA,  July 04, 2010 11:18 AM  Follow-up for Phone Call        Called, spoke with pt.  States she received flu shot on Wednesday.  Yesterday the area was swollen and looked like a rash but has gotten worse.  Now is "bigger than a silver dollar."  Area is red, raised, warm to touch, and itches.  Requesting SN's recs. Gweneth Dimitri RN  July 04, 2010 11:59 AM   Additional Follow-up for Phone Call Additional follow up Details #1::        per SN---apply ice as needed   and take benadryl 25mg   otc every 4 hours as needed .  thanks Randell Loop CMA  July 04, 2010 12:36 PM     Additional Follow-up for Phone Call Additional follow up Details #2::    Spoke with pt and notified of recs per SN.  Pt verbalized understanding. Follow-up by: Vernie Murders,  July 04, 2010 1:23 PM

## 2010-08-27 NOTE — Progress Notes (Signed)
Summary: sample of albuterol  Phone Note Call from Patient Call back at Home Phone [phone]   Caller: Patient Call For: nadel Summary of Call: Patient phoned and she has ran out of her albuterol and wants to know if we have any samples that she can get and if not she needs a prescription [facility] into [facility] [address] [phone]. States that she despartely needs some. She can be reached [phone] Initial call taken by: Vedia Coffer,  August 21, 2010 12:24 PM  Follow-up for Phone Call        I left her a few samples up front and also called in rx per pt request. Pt aware.  Follow-up by: Vernie Murders,  August 21, 2010 2:34 PM    Prescriptions: ALBUTEROL SULFATE (2.5 MG/3ML) 0.083%  NEBU (ALBUTEROL SULFATE) use in neb treatment up to four times a day as needed for wheezing...  #120 x 5   Entered by:   Vernie Murders   Authorized by:   Michele Mcalpine MD   Signed by:   Vernie Murders on 08/21/2010   Method used:   Electronically to        [facility]  [address] [phone]* (retail)       [address]       [place], [place]  [address]       Ph: [phone]       Fax: [phone]   RxID:   [id number]

## 2010-08-27 NOTE — Progress Notes (Signed)
Summary: wants to switch her muslce relaxer  Phone Note Call from Patient   Caller: Patient Call For: Nadel Summary of Call: Patient phoned wanted to leave a message regarding the muscle relaxer that she is taking and her insurance will not pay for it and she wants to know if it can be switched to generic Flexiril if her insurance will cover that one.  Stated that she used to take the generic flexiril and it was covered [facility] [address] [phone]. Patient can be reached [phone] Initial call taken by: Vedia Coffer,  August 18, 2010 4:12 PM  Follow-up for Phone Call        Spoke with pt and she states  that the robaxin is too expensive and she is requesting a rx for generic flexeril. Please advise. Carron Curie CMA  August 18, 2010 4:50 PM  Per SN, Yes. cyclobenzaprine 10mg  #90.  1 by mouth three times a day as needed muscle spasms.  Refill x 6.  Pt aware. Crystal Jones RN  August 19, 2010 10:45 AM     New/Updated Medications: CYCLOBENZAPRINE HCL 10 MG TABS (CYCLOBENZAPRINE HCL) Take 1 tablet by mouth three times a day as needed muscle spasms Prescriptions: CYCLOBENZAPRINE HCL 10 MG TABS (CYCLOBENZAPRINE HCL) Take 1 tablet by mouth three times a day as needed muscle spasms  #90 x 6   Entered by:   Gweneth Dimitri RN   Authorized by:   Michele Mcalpine MD   Signed by:   Gweneth Dimitri RN on 08/19/2010   Method used:   [facility]        [facility]  [address] [phone]* (retail)       [address]       [place], [place]  [address]       Ph: [phone]       Fax: [phone]   RxID:   [id number]

## 2010-09-10 NOTE — Letter (Signed)
Summary: [facility]   Imported By: Sherian Rein 09/01/2010 14:08:47  _____________________________________________________________________  External Attachment:    Type:   Image     Comment:   External Document

## 2010-10-05 LAB — CBC
HCT: 36.7 % (ref 36.0–46.0)
HCT: 37.3 % (ref 36.0–46.0)
HCT: 38.1 % (ref 36.0–46.0)
MCHC: 33.3 g/dL (ref 30.0–36.0)
MCHC: 34 g/dL (ref 30.0–36.0)
MCHC: 34.3 g/dL (ref 30.0–36.0)
MCHC: 34.7 g/dL (ref 30.0–36.0)
MCV: 100.2 fL — ABNORMAL HIGH (ref 78.0–100.0)
MCV: 99 fL (ref 78.0–100.0)
MCV: 99.1 fL (ref 78.0–100.0)
Platelets: 100 10*3/uL — ABNORMAL LOW (ref 150–400)
Platelets: 110 10*3/uL — ABNORMAL LOW (ref 150–400)
Platelets: 130 10*3/uL — ABNORMAL LOW (ref 150–400)
Platelets: 131 10*3/uL — ABNORMAL LOW (ref 150–400)
Platelets: UNDETERMINED 10*3/uL (ref 150–400)
RBC: 3.64 MIL/uL — ABNORMAL LOW (ref 3.87–5.11)
RDW: 17.7 % — ABNORMAL HIGH (ref 11.5–15.5)
RDW: 18 % — ABNORMAL HIGH (ref 11.5–15.5)
RDW: 18 % — ABNORMAL HIGH (ref 11.5–15.5)
RDW: 18.5 % — ABNORMAL HIGH (ref 11.5–15.5)
WBC: 4.6 10*3/uL (ref 4.0–10.5)

## 2010-10-05 LAB — BASIC METABOLIC PANEL
BUN: 8 mg/dL (ref 6–23)
BUN: 8 mg/dL (ref 6–23)
BUN: 8 mg/dL (ref 6–23)
BUN: 9 mg/dL (ref 6–23)
CO2: 29 mEq/L (ref 19–32)
CO2: 30 mEq/L (ref 19–32)
CO2: 32 mEq/L (ref 19–32)
CO2: 32 mEq/L (ref 19–32)
Calcium: 8.9 mg/dL (ref 8.4–10.5)
Calcium: 9 mg/dL (ref 8.4–10.5)
Chloride: 102 mEq/L (ref 96–112)
Chloride: 103 mEq/L (ref 96–112)
Chloride: 99 mEq/L (ref 96–112)
Creatinine, Ser: 1.21 mg/dL — ABNORMAL HIGH (ref 0.4–1.2)
Creatinine, Ser: 1.29 mg/dL — ABNORMAL HIGH (ref 0.4–1.2)
Creatinine, Ser: 1.31 mg/dL — ABNORMAL HIGH (ref 0.4–1.2)
Creatinine, Ser: 1.37 mg/dL — ABNORMAL HIGH (ref 0.4–1.2)
GFR calc Af Amer: 47 mL/min — ABNORMAL LOW (ref >60)
GFR calc Af Amer: 50 mL/min — ABNORMAL LOW (ref >60)
GFR calc Af Amer: 54 mL/min — ABNORMAL LOW (ref >60)
GFR calc non Af Amer: 38 mL/min — ABNORMAL LOW (ref >60)
GFR calc non Af Amer: 39 mL/min — ABNORMAL LOW (ref >60)
Glucose, Bld: 88 mg/dL (ref 70–99)
Glucose, Bld: 91 mg/dL (ref 70–99)
Glucose, Bld: 95 mg/dL (ref 70–99)
Glucose, Bld: 99 mg/dL (ref 70–99)
Potassium: 3.8 mEq/L (ref 3.5–5.1)
Potassium: 3.9 mEq/L (ref 3.5–5.1)
Sodium: 140 mEq/L (ref 135–145)

## 2010-10-05 LAB — TSH: TSH: 1.015 u[IU]/mL (ref 0.350–4.500)

## 2010-10-05 LAB — PROTIME-INR
INR: 1.74 — ABNORMAL HIGH (ref 0.00–1.49)
INR: 2 — ABNORMAL HIGH (ref 0.00–1.49)
INR: 2.19 — ABNORMAL HIGH (ref 0.00–1.49)
INR: 2.3 — ABNORMAL HIGH (ref 0.00–1.49)
Prothrombin Time: 20.2 seconds — ABNORMAL HIGH (ref 11.6–15.2)
Prothrombin Time: 22.5 seconds — ABNORMAL HIGH (ref 11.6–15.2)
Prothrombin Time: 22.7 seconds — ABNORMAL HIGH (ref 11.6–15.2)
Prothrombin Time: 25.3 seconds — ABNORMAL HIGH (ref 11.6–15.2)

## 2010-10-05 LAB — CARDIAC PANEL(CRET KIN+CKTOT+MB+TROPI)
CK, MB: 0.4 ng/mL (ref 0.3–4.0)
Relative Index: INVALID (ref 0.0–2.5)
Total CK: 19 U/L (ref 7–177)

## 2010-10-05 LAB — CK TOTAL AND CKMB (NOT AT ARMC)
Relative Index: INVALID (ref 0.0–2.5)
Relative Index: INVALID (ref 0.0–2.5)
Total CK: 23 U/L (ref 7–177)

## 2010-10-05 LAB — COMPREHENSIVE METABOLIC PANEL
ALT: 16 U/L (ref 0–35)
AST: 26 U/L (ref 0–37)
CO2: 28 mEq/L (ref 19–32)
Calcium: 9.7 mg/dL (ref 8.4–10.5)
GFR calc Af Amer: 44 mL/min — ABNORMAL LOW (ref >60)
GFR calc non Af Amer: 36 mL/min — ABNORMAL LOW (ref >60)
Sodium: 138 mEq/L (ref 135–145)

## 2010-10-05 LAB — DIFFERENTIAL
Eosinophils Absolute: 0.2 10*3/uL (ref 0.0–0.7)
Eosinophils Relative: 3 % (ref 0–5)
Lymphs Abs: 2.6 10*3/uL (ref 0.7–4.0)
Monocytes Absolute: 0.6 10*3/uL (ref 0.1–1.0)
Monocytes Relative: 11 % (ref 3–12)

## 2010-10-05 LAB — HEMOGLOBIN A1C: Hgb A1c MFr Bld: 5.5 % (ref <5.7)

## 2010-10-05 LAB — TROPONIN I: Troponin I: 0.01 ng/mL (ref 0.00–0.06)

## 2010-10-05 LAB — APTT: aPTT: 40 seconds — ABNORMAL HIGH (ref 24–37)

## 2010-10-08 LAB — CBC
HCT: 28.9 % — ABNORMAL LOW (ref 36.0–46.0)
Hemoglobin: 9.8 g/dL — ABNORMAL LOW (ref 12.0–15.0)
MCHC: 34.1 g/dL (ref 30.0–36.0)
MCHC: 34.2 g/dL (ref 30.0–36.0)
MCV: 100.9 fL — ABNORMAL HIGH (ref 78.0–100.0)
MCV: 99.9 fL (ref 78.0–100.0)
Platelets: 100 10*3/uL — ABNORMAL LOW (ref 150–400)
RBC: 2.86 MIL/uL — ABNORMAL LOW (ref 3.87–5.11)
RBC: 3.29 MIL/uL — ABNORMAL LOW (ref 3.87–5.11)
WBC: 8.8 10*3/uL (ref 4.0–10.5)

## 2010-10-08 LAB — BASIC METABOLIC PANEL
CO2: 30 mEq/L (ref 19–32)
Calcium: 8.2 mg/dL — ABNORMAL LOW (ref 8.4–10.5)
Chloride: 101 mEq/L (ref 96–112)
GFR calc Af Amer: 44 mL/min — ABNORMAL LOW (ref >60)
Potassium: 4 mEq/L (ref 3.5–5.1)
Sodium: 139 mEq/L (ref 135–145)

## 2010-10-08 LAB — POCT I-STAT, CHEM 8
Hemoglobin: 11.9 g/dL — ABNORMAL LOW (ref 12.0–15.0)
Potassium: 3.7 mEq/L (ref 3.5–5.1)
Sodium: 136 mEq/L (ref 135–145)
TCO2: 34 mmol/L (ref 0–100)

## 2010-10-08 LAB — DIFFERENTIAL
Basophils Relative: 1 % (ref 0–1)
Eosinophils Absolute: 0.1 10*3/uL (ref 0.0–0.7)
Lymphs Abs: 1.1 10*3/uL (ref 0.7–4.0)
Neutro Abs: 6.7 10*3/uL (ref 1.7–7.7)
Neutrophils Relative %: 77 % (ref 43–77)

## 2010-10-08 LAB — PROTIME-INR
INR: 1.71 — ABNORMAL HIGH (ref 0.00–1.49)
INR: 2.01 — ABNORMAL HIGH (ref 0.00–1.49)
INR: 2.74 — ABNORMAL HIGH (ref 0.00–1.49)
Prothrombin Time: 19.9 seconds — ABNORMAL HIGH (ref 11.6–15.2)
Prothrombin Time: 28.8 seconds — ABNORMAL HIGH (ref 11.6–15.2)

## 2010-10-08 LAB — D-DIMER, QUANTITATIVE: D-Dimer, Quant: 1.13 ug/mL-FEU — ABNORMAL HIGH (ref 0.00–0.48)

## 2010-10-08 LAB — BRAIN NATRIURETIC PEPTIDE: Pro B Natriuretic peptide (BNP): 365 pg/mL — ABNORMAL HIGH (ref 0.0–100.0)

## 2010-10-10 ENCOUNTER — Telehealth: Payer: Self-pay | Admitting: Pulmonary Disease

## 2010-10-10 MED ORDER — DOXYCYCLINE HYCLATE 100 MG PO CAPS: 100.0000 mg | ORAL_CAPSULE | Freq: Two times a day (BID) | ORAL | Status: AC

## 2010-10-10 MED ORDER — DM-GUAIFENESIN ER 30-600 MG PO TB12: 1.0000 | ORAL_TABLET | Freq: Two times a day (BID) | ORAL | Status: AC

## 2010-10-10 NOTE — Telephone Encounter (Signed)
Called and spoke with pt and she is aware per TP to take the mucinex dm bid with plenty of fluids and rest and will call in doxy 100mg  #14   1 po bid ..pt is aware of recs and of meds sent to [facility] [address]

## 2010-10-10 NOTE — Telephone Encounter (Signed)
Patient called back stated that she just spoke to someone her and she wanted to add that she would like something generic that is affordable. She can be reached at [phone].Vedia Coffer

## 2010-10-10 NOTE — Telephone Encounter (Signed)
Spoke with pt.  She is c/o prod cough with green sputum and chest congestion x 3 days.  She denies any other complaints.  Took round of avelox 2 wks ago and improved on this, but does not want to take this again b/c of cost.  She is requesting a generic abx.  Please advise thanks Allergic to metalazone.

## 2010-10-10 NOTE — Telephone Encounter (Signed)
Mucinex DM Twice daily  As needed  For cough/congestion  Fluids and rest  Can begin  Doxycycline 100mg  #14   1 po Twice daily  , no refills   IF she is not improving will need ov.  Please contact office for sooner follow up if symptoms do not improve or worsen or seek emergency care

## 2010-10-12 ENCOUNTER — Other Ambulatory Visit: Payer: Self-pay | Admitting: Pulmonary Disease

## 2010-11-03 ENCOUNTER — Telehealth: Payer: Self-pay | Admitting: Pulmonary Disease

## 2010-11-03 DIAGNOSIS — F411 Generalized anxiety disorder: Secondary | ICD-10-CM

## 2010-11-03 DIAGNOSIS — E785 Hyperlipidemia, unspecified: Secondary | ICD-10-CM

## 2010-11-03 DIAGNOSIS — I2510 Atherosclerotic heart disease of native coronary artery without angina pectoris: Secondary | ICD-10-CM

## 2010-11-03 DIAGNOSIS — N259 Disorder resulting from impaired renal tubular function, unspecified: Secondary | ICD-10-CM

## 2010-11-03 NOTE — Telephone Encounter (Signed)
Per SN---ok for fasting labs--just let pt know that the labs are in the computer for her, just make sure that she comes fasting.  thanks

## 2010-11-03 NOTE — Telephone Encounter (Signed)
Spoke w/ pt and she states when she saw Dr. Kriste Basque 07/02/10 she was suppose to have labs done but never did. Pt is scheduled for follow up 12/29/10 but pt is requesting to have fasting labs now b/c she didn't at last OV. Pt states her "other" doctors keep asking her when will she have her labs done. Please advise Dr. Kriste Basque if pt can come in and have fasting labs done today. Thanks  Carver Fila, CMA

## 2010-11-03 NOTE — Telephone Encounter (Signed)
Pt aware labs in the computer, and she will need to be fasting for these.  She verbalized understanding of this.

## 2010-11-04 ENCOUNTER — Other Ambulatory Visit (INDEPENDENT_AMBULATORY_CARE_PROVIDER_SITE_OTHER): Payer: MEDICARE

## 2010-11-04 DIAGNOSIS — N259 Disorder resulting from impaired renal tubular function, unspecified: Secondary | ICD-10-CM

## 2010-11-04 DIAGNOSIS — Z79899 Other long term (current) drug therapy: Secondary | ICD-10-CM

## 2010-11-04 DIAGNOSIS — I2510 Atherosclerotic heart disease of native coronary artery without angina pectoris: Secondary | ICD-10-CM

## 2010-11-04 DIAGNOSIS — F411 Generalized anxiety disorder: Secondary | ICD-10-CM

## 2010-11-04 DIAGNOSIS — E785 Hyperlipidemia, unspecified: Secondary | ICD-10-CM

## 2010-11-04 LAB — LIPID PANEL
Cholesterol: 99 mg/dL (ref 0–200)
HDL: 43.4 mg/dL (ref >39.00)

## 2010-11-04 LAB — HEPATIC FUNCTION PANEL
AST: 18 U/L (ref 0–37)
Albumin: 3.9 g/dL (ref 3.5–5.2)
Alkaline Phosphatase: 85 U/L (ref 39–117)
Total Protein: 7.6 g/dL (ref 6.0–8.3)

## 2010-11-04 LAB — HEMOGLOBIN A1C: Hgb A1c MFr Bld: 6 % (ref 4.6–6.5)

## 2010-11-04 LAB — CBC WITH DIFFERENTIAL/PLATELET
Basophils Absolute: 0 10*3/uL (ref 0.0–0.1)
Hemoglobin: 10.9 g/dL — ABNORMAL LOW (ref 12.0–15.0)
Lymphocytes Relative: 32.9 % (ref 12.0–46.0)
Monocytes Relative: 8.8 % (ref 3.0–12.0)
Platelets: 135 10*3/uL — ABNORMAL LOW (ref 150.0–400.0)
RDW: 18.7 % — ABNORMAL HIGH (ref 11.5–14.6)

## 2010-11-04 LAB — BASIC METABOLIC PANEL
CO2: 34 mEq/L — ABNORMAL HIGH (ref 19–32)
Calcium: 9.2 mg/dL (ref 8.4–10.5)
Glucose, Bld: 70 mg/dL (ref 70–99)
Sodium: 141 mEq/L (ref 135–145)

## 2010-11-04 LAB — TSH: TSH: 2.13 u[IU]/mL (ref 0.35–5.50)

## 2010-12-02 NOTE — H&P (Signed)
[name], MCCANDLISH NO.:  [id number]   MEDICAL RECORD NO.:  [id number]          PATIENT TYPE:  INP   LOCATION:  2903                         FACILITY:  [facility]   PHYSICIAN:  Ulyses Amor, MD DATE OF BIRTH:  10-25-1940   DATE OF ADMISSION:  03/08/2007  DATE OF DISCHARGE:                              HISTORY & PHYSICAL   IDENTIFYING DATA AND CHIEF COMPLAINT:  The patient is a 70 year old  white woman who is admitted to [facility] for further  evaluation of chest pain.   HISTORY OF THE PRESENT ILLNESS:  The patient has a history of coronary  artery disease, which dates back to 61.  At that time she underwent  coronary artery bypass surgery.  Her last cardiac catheterization was  performed in 2004.  This demonstrated patent grafts and normal left  ventricular function.  She has no history of myocardial infarction or  congestive heart failure.  There is a history of chronic atrial  fibrillation.   The patient's past medical history is also notable for hypertension,  dyslipidemia, renal insufficiency and fibromyalgia.   The patient presented to the emergency department with a three-day  history of intermittent chest pain.  The chest pain is described as  pressure in the substernal region, left anterior  chest, left shoulder  and left arm.  The episodes occur at random and appear to be unrelated  to position, activity, meals or respirations.  It last several minutes  to several hours at a time, resolves spontaneously and then recurs.  There has been no associated dyspnea, diaphoresis or nausea.  The  patient has just completed a several-week course of antibiotics for  bronchitis.  Her cough has improved, but has not completely resolved.  The patient is uncertain as to whether or not this chest pain is similar  to her past cardiac chest pain.  She is free of chest pain at this time.  There are no exacerbating or ameliorating factors to her chest  pain.  She took one nitroglycerin tablet yesterday, which seemed to afford her  some temporary improvement.   PAST MEDICAL HISTORY:  The patient's other medical problems are as  described above.   PAST SURGICAL HISTORY:  1. Appendectomy.  2. Cholecystectomy.  3. Hysterectomy.  4. Multiple back operations.  5. Coronary artery bypass surgery.   ALLERGIES:  MORPHINE.   MEDICATIONS:  Oxycodone, OxyContin, estradiol, amitriptyline, Cartia XT,  cyclobenzaprine, furosemide, metoprolol, Neurontin, Prevacid, Zocor, and  Coumadin.   SOCIAL HISTORY:  The patient lives with her husband.  She does not work.  She no longer smokes cigarettes.  She does not drink alcohol.   FAMILY HISTORY:  The family history is notable for coronary artery  disease.   REVIEW OF SYSTEMS:  Review of systems reveals no new problems related to  her head, eyes, ears, nose, mouth, throat, lungs, gastrointestinal  system, genitourinary system, or extremities.  There is no history of  neurologic or psychiatric disorder.  There is no history of fever,  chills or weight loss.   PHYSICAL EXAMINATION:  VITAL SIGNS:  Blood pressure 116/80, pulse 76  and  regular, respirations 20, temperature 98.3, and pulse ox 98% on room  air.  GENERAL APPEARANCE:  The patient is an obese older white woman in no  discomfort.  She is alert, oriented, appropriate and responsive.  HEENT:  Head, eyes, nose and mouth are normal.  NECK:  The neck is without thyromegaly or adenopathy.  Carotid pulses  are palpable bilaterally and are without bruits.  HEART:  Cardiac examination reveals a normal S1 and S2.  There is no S3,  S4, murmur, rub, or click.  Palpation of the anterior chest wall elicited tenderness, but the  patient indicates that this is not the chest discomfort, which  precipitated her visit to the emergency department.  LUNGS:  The lungs are clear.  ABDOMEN:  The abdomen is soft and nontender.  There is no mass,   hepatosplenomegaly, bruit, distention, rebound, guarding, or rigidity.  Bowel sounds are normal.  BREASTS, PELVIC AND RECTAL:  Breast, pelvic and rectal examinations are  not performed as they are not pertinent to the reason for acute care  hospitalization.  EXTREMITIES:  The extremities are without edema, deviation or deformity.  Radial and dorsalis pedis pulses are palpable bilaterally.  NEUROLOGIC EXAMINATION:  Brief screening neurologic survey is  unremarkable.   LABORATORY DATA:  The electrocardiogram revealed atrial fibrillation.  There was nonspecific ST and T wave abnormality.  The chest radiography  according to the radiologist demonstrated no evidence of acute disease.  BNP was 208the initial set of cardiac markers revealed a myoglobin of  104, CK/MB 1.0 and troponin less than 0.05.  White count was 7.3 with a  hemoglobin of 13.6 and hematocrit of 39.5.  Potassium was 3.9, BUN 32  and creatinine 2.2.  INR was 2.8.  The remaining studies are pending at  this time of this dictation.   IMPRESSION:  1. Chest pain, rule out unstable angina.  2. Coronary artery disease status post coronary artery bypass surgery      in 1997.  The last cardiac catheterization was performed in 2004      and demonstrated patent grafts and normal left ventricular      function.  3. Chronic atrial fibrillation.  4. Hypertension.  5. Dyslipidemia.  6. Renal insufficiency; creatinine 2.2.  7. Fibromyalgia.   PLAN:  1. Telemetry.  2. Serial cardiac enzymes.  3. No heparin, Lovenox or aspirin as the INR is 2.8 on Coumadin.  4. Intravenous nitroglycerin.  5. Further measures per Dr. Allyson Sabal.      Ulyses Amor, MD  Electronically Signed     MSC/MEDQ  D:  03/09/2007  T:  03/09/2007  Job:  [id number]

## 2010-12-02 NOTE — Cardiovascular Report (Signed)
NAME:  Dana, Bowers NO.:  [id number]   MEDICAL RECORD NO.:  [id number]          PATIENT TYPE:  OIB   LOCATION:  2807                         FACILITY:  [facility]   PHYSICIAN:  [name], M.D.   DATE OF BIRTH:  Nov 11, 1940   DATE OF PROCEDURE:  05/01/2008  DATE OF DISCHARGE:                            CARDIAC CATHETERIZATION   [name] is a 70 year old moderately overweight married white female with  history of CAD status post bypass grafting in 1997 by Dr. Sheliah Plane.  Her other problems include hypertension, hyperlipidemia, and  chronic atrial fibrillation on Coumadin anticoagulation.  She also has  sleep apnea.  She was last cathed by me on April 03, 2003, revealing  patent grafts and normal LV function.  Myoview stress test performed a  year ago is low risk, however, Myoview performed several weeks ago did  show new inferior ischemia since her prior study.  The patient has taken  6-8 sublingual nitroglycerin for chest pain over the last several  months.  She presents now for outpatient diagnostic coronary  arteriography to define her anatomy and rule out an ischemic etiology.  She did hold her Coumadin 4 days prior to coming to the hospital and INR  of 1.3 this morning.   PROCEDURE DESCRIPTION:  The patient was brought to the Second Floor  [facility] in the postabsorptive state.  She was  premedicated with p.o. Valium, IV Versed, and fentanyl.   Her right groin was prepped and shaved in the usual sterile fashion.  Xylocaine 1% was used for local anesthesia.  A 6-French sheath was  inserted into the right femoral artery using standard Seldinger  technique.  A 6-French right and left Judkins diagnostic catheter as  well as 6-French pigtail catheter were used for selective  cholangiography, selective vein graft angiography, selective IMA  angiography, and distal abdominal aortography.  Visipaque dye was used  for the entirety of the  case.  Aortic, ventricular, and pulmonic  pressures were recorded.   HEMODYNAMICS:  1. Aortic systolic pressure 152 and diastolic pressure 68.  2. Ventricular systolic pressure 145 and diastolic pressure 19.  3. Selective cholangiography.      a.     Left main normal.      b.     LAD; LAD had a 90% ostial/proximal stenosis.      c.     LAD; LAD was patent beyond the stenosis down to the apex.       It gave off a small first and a larger second diagonal branch.       There was competitive flow noted angiographically.  The LAD gave       off a large first diagonal branch in the obtuse diagonal       distribution with competitive flow.      d.     Circumflex; free of significant disease.      e.     Right coronary artery; dominant with a long subtotal lesion       in the midsegment with distal competitive flow.      f.  Vein graft to the distal right; widely patent.      g.     Vein graft to the ramus branch; widely patent.      h.     LIMA to the LAD patent, __________ small in caliber.      i.     Left ventriculography; RAO left ventriculogram was performed       using 25 mL of Visipaque dye 12 mL per second.  The overall LVEF       was estimated at greater than 60% without focal wall motion       abnormalities.   IMPRESSION:  [name] has widely patent grafts with anatomy  similar to a prior cath 5 years ago.  I believe her Myoview was  positive.  Continued medical therapy will be recommended.   Sheath was removed and pressure was applied on the groin to achieve  hemostasis.  The patient left the lab in stable condition.  She will be  re-heparinized in 4 hours and pharmacy will start Coumadin tonight.  She  will be discharged home once her INR is greater than or equal to 2.      [name], M.D.  Electronically Signed     JB/MEDQ  D:  05/01/2008  T:  05/01/2008  Job:  [id number]   cc:   [facility]  [facility]. Kriste Basque, MD

## 2010-12-02 NOTE — Discharge Summary (Signed)
[name], [name] NO.:  [id number]   MEDICAL RECORD NO.:  [id number]          PATIENT TYPE:  INP   LOCATION:  2029                         FACILITY:  [facility]   PHYSICIAN:  [name], M.D.   DATE OF BIRTH:  1940/11/02   DATE OF ADMISSION:  05/01/2008  DATE OF DISCHARGE:  05/03/2008                               DISCHARGE SUMMARY   DISCHARGE DIAGNOSES:  1. Angina with increased use of nitroglycerin.  2. Abnormal Myoview stress test.  3. Coronary artery disease with previous bypass grafting in 1997 and      previously patent grafts.      a.     Cardiac catheterization on May 01, 2008, with patent       grafts with false positive Myoview.  4. Chronic atrial fibrillation.  5. Frequent pauses during sleep with change in Cartia to a lower dose.  6. Anticoagulation, secondary to atrial fibrillation.  7. Obstructive sleep apnea.  8. Chronic bronchitis.  9. Hypertension, controlled.  10.Hyperlipidemia, controlled.  11.Thrombocytopenia.  12.Renal insufficiency.   DISCHARGE CONDITION:  Stable.   PROCEDURES:  Combined left heart cath with graft visualization on  May 01, 2008, by Dr. Allyson Sabal.   DISCHARGE MEDICATIONS:  1. Metoprolol 50 mg half twice a day as before.  2. Zocor 20 mg at bedtime.  3. Cartia decreased to 120 mg daily at bedtime.  4. Alprazolam 0.5 mg 4 times a day as before.  5. Amitriptyline 50 mg 2 at bedtime as before.  6. Estradiol 1 mg daily.  7. KCl 20 mEq twice a day.  8. Neurontin 600 mg twice a day.  9. Prevacid 30 mg twice a day.  10.Coumadin take 7.5 mg today, May 03, 2008, and on Friday,      May 04, 2008, take 7.5 mg, then begin 5 mg daily as before.  11.Lasix 40 mg twice a day.  12.Oxycodone 10/325 four times a day if needed.  13.Lovenox 90 mg every 12 hours for 3 doses at home then stop.  If so,      she will take it at 11:30 p.m. on the day of discharge; then 11      a.m. Friday, May 04, 2008, Friday night  10:16 p.m. and stop.  14.Mucinex 600 mg twice a day as needed.  15.Promethazine 25 mg every 4 hours if needed.  16.Albuterol 2 puffs nebulizer as needed as before.   DISCHARGE INSTRUCTIONS:  1. Low-sodium heart-healthy diet.  2. Wash cath site with soap and water.  Call if any bleeding,      swelling, or drainage.  3. Increase activity slowly.  4. May shower.  5. No lifting for 1 day.  No driving for 1 day.  6. Follow up with Dr. Allyson Sabal 2-1/2 weeks on May 21, 2008, at 10      a.m.  7. Coumadin Clinic on May 07, 2008, at 2:30 p.m.  8. We will receive a monitor in the mail in 1-2 days throughout for 2      weeks and follow up with Dr. Allyson Sabal because her heart rate would  drop very briefly in the hospital, so we decreased her Cardizem,      i.e., Cartia.   HISTORY OF PRESENT ILLNESS:  The patient with known coronary artery  disease and bypass grafting in 1997 and patent grafts in 2004 on cardiac  cath and normal Myoview at that time had increasing episodes of use of  nitroglycerin.  She underwent Myoview stress test, which showed inferior  ischemia.  She was brought in as an outpatient for cardiac  catheterization.  We stopped her Coumadin 4 days prior to procedure, and  the patient was agreeable to having the test done.  Other history does  include chronic AFib, hypertension, hyperlipidemia, and Coumadin  anticoagulation, and obstructive sleep apnea.  She underwent cardiac  cath, which found patent grafts and normal LV function.  It was a false  positive Myoview.  She was stable.  Plan for her to discharge for the  next day, but unfortunately her heart rate was down into the 40s, and  Lopressor and Cardizem were held one night the next day, we adjusted her  meds to decrease her Cardizem.  When her INR was therapeutic to 2, plan  was for discharge.  By May 03, 2008, her INR was still not  therapeutic and therefore, she was placed on Lovenox and plans for  discharge home  after this if she could afford the medication, which she.  She was quite anxious to leave the hospital.   PHYSICAL EXAMINATION AT DISCHARGE:  VITAL SIGNS:  Blood pressure 112/55,  heart rate 68 irregularly irregular, and afebrile.  LUNGS:  Clear.  HEART:  Distant heart sounds.  ABDOMEN:  Nontender.  GU:  Groin site was without complications.   LABORATORY DATA:  Hemoglobin at admission 12.5, hematocrit 37, WBC 5.1,  and platelets 109, platelets she did drop down to 90, but she has been  stable with these numbers previously.  At discharge, hemoglobin 11.2 and  hematocrit 33.6.  Protime on arrival to the Cath Lab Short-Stay was  protime 16.1, INR 1.3.  She was placed on heparin after procedure.  She  was therapeutic on heparin.  INR at discharge was 1.5, protime 18.7, and  heparin was discontinued.  She was changed to Lovenox and plans were for  discharge.   Chemistries:  Sodium 136, potassium 4.1, chloride 104, CO2 of 24,  glucose 98, BUN 20, creatinine 1.53, and at discharge she was 1.65.   Total protein 7.1, albumin 3.9, AST 26, ALT 16, alkaline phos 87, and  total bili 1.   Chest x-ray on April 25, 2008, no acute findings.  EKG; atrial fib,  nonspecific T-wave abnormalities.   HOSPITAL COURSE:  As previously stated, the patient was discharged on  Lovenox, Coumadin crossover, and would follow up as an outpatient.       Darcella Gasman. Annie Paras, N.P.      [name], M.D.  Electronically Signed    LRI/MEDQ  D:  06/03/2008  T:  06/04/2008  Job:  [id number]   cc:   [facility]. Kriste Basque, MD

## 2010-12-02 NOTE — Discharge Summary (Signed)
[name], [name] NO.:  [id number]   MEDICAL RECORD NO.:  [id number]          PATIENT TYPE:  OBV   LOCATION:  2903                         FACILITY:  [facility]   PHYSICIAN:  [name], M.D.   DATE OF BIRTH:  01-Jan-1941   DATE OF ADMISSION:  03/08/2007  DATE OF DISCHARGE:  03/09/2007                               DISCHARGE SUMMARY   DISCHARGE DIAGNOSES:  1. Chest pain consistent with angina, myocardial infarction ruled out.  2. Known coronary disease with previous of coronary artery bypass      grafting in 1997 with patent grafts in 2004.  3. Chronic renal insufficiency with a creatinine 2.1.  4. Treated hypertension.  5. Fibromyalgia.  6. Atrial fibrillation, chronic, on chronic Coumadin.   HOSPITAL COURSE:  [name] is a 70 year old female followed by Dr.  Allyson Sabal with multiple medical problems as noted above.  She had bypass  surgery in 1997 and had a cath in 2004 that showed normal function and  patent grafts.  She has chronic AF and is on Coumadin and has chronic  renal insufficiency.  She presented to the emergency room department  with three day history of intermittent chest pain described as  substernal pressure.  She recently has finished a course of antibiotics  for bronchitis.  She was admitted by Dr. Effie Shy.  Her enzymes have been  negative although the third set is pending.  She was seen in the morning  of the 20th by Dr. Mariah Milling and felt that she could be discharged if her  third enzyme is negative.  She will follow up with Dr. Allyson Sabal as an  outpatient.  She had been scheduled for a nuclear stress test in October  and this will be moved up to Friday this week.   LABORATORY DATA:  White count 5.4, hemoglobin 13.6, hematocrit 39.8,  platelets 155.  Sodium 134, potassium 4.5, BUN 27, creatinine 2.  INR is  2.8 on admission.  Troponins were negative x2.   Chest x-ray shows stable chest.  No acute findings.   EKG shows atrial fibrillation  without acute changes.   DISPOSITION:  The patient is discharged in stable condition.  Her stress  test to be moved up.  We will see her back in the office after this.      Abelino Derrick, P.A.      [name], M.D.  Electronically Signed    LKK/MEDQ  D:  03/09/2007  T:  03/10/2007  Job:  [id number]

## 2010-12-05 NOTE — Op Note (Signed)
[place]  Patient:    [name], [name]                  MRN: [id number] Proc. Date: 12/17/00 Adm. 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  PROCEDURE:  L5-S1 decompressive laminectomy with right L5-S1 diskectomy, bilateral foraminotomies, microdissection with microscopes.  SURGEON:  Clydene Fake, M.D.  ASSISTANT:  Izell [place]. Elesa Hacker, M.D.  ANESTHESIA:  General endotracheal anesthesia.  ESTIMATED BLOOD LOSS:  Less than 100 cc.  BLOOD GIVEN:  None.  REASON FOR PROCEDURE:  Patient is a 70 year old woman with multiple prior lumbar surgeries, who has had persistent right S1 radiculopathy with progressive lumbar stenosis at 5-1 due to epidural scarring and progressive spondylosis.  Patient brought in for decompression.  COMPLICATIONS:  None.  DRAINS:  None.  PROCEDURE IN DETAIL:  The patient was brought in the operating room, general anesthesia was induced.  Patient was placed in a prone position on a Wilson frame with all pressure points padded.  Patient was prepped and draped in a sterile fashion and site of incision was injected with 20 cc of 1% lidocaine with epinephrine.  An incision was then made at the lower end of the lumbar spine at the site of previous area of incision and taken down to the fascia and hemostasis obtained with Bovie cauterization.  The fascia was incised with the Bovie and dissection was taken down through extensive scar tissue.  We then very carefully started dissecting out laterally until the pedicle screw at L5 was seen bilaterally.  We then were able to use this as a guide to come down on the facet joints dissecting out inferiorly to the sacrum.  We  then dissected the sacrum and superior aspect of the sacral edge, 5-1 interspace, the L5 lamina on the right side.  We then repeated this on the left side. X-ray was used for localization.  Two x-rays were taken with markers to help with the dissection and direction and the last x-ray showed we were at the 5-1 interspace.  At this point, the microscope was brought in for the microdissection and using Kerrison punches, curets L5 laminectomy was performed first on the right and then bilaterally as we extended it down towards the sacrum.  There was very dense scar tissue between 5-1.  This was left for the moment and we dissected the sacrum to which dural edge and removed the very superior aspect of the sacrum on the right side.  We then were able to do a foraminotomy over the S1 root and find the pedicle of S1. We then ______ through the scar tissue removing most of it up to where the L5 lamina was.  This decompressed the S1 root.  We then carefully dissected the scar from the anterior portion of the S1 root and dura, reflecting the dura, medially found a disk space and incised the disk space with 15-blade.  A diskectomy was performed with pituitary rongeurs and curets.  After this, the dura and S1 nerve root were very well decompressed.  There seemed to be a ridge where the S1 nerve root crossed the disk space and then went  into its foramen and we used an osteophyte remover to smooth that out.  We then extended again the lamina over to the left side and performed a S1 foraminotomy over the left S1 root.  That side was not as stenosed or compressed.  Did not have as much scar tissue as the right.  When we were finished, both S1 nerve roots were clear.  L5 foramen were also open.  Wound was irrigated with antibiotic solution and hemostasis obtained with bipolar cauterization and with Gelfoam and thrombin.  The Gelfoam was then irrigated out and two small pieces of Gelfoam that were soaked in  Depo Medrol were placed over the S1 roots bilaterally.  The paraspinous muscles and fascia were then closed with 0 Vicryl interrupted sutures, and subcutaneous tissues were closed with 0, 2-0, and 3-0 Vicryl interrupted sutures and the skin closed with a running 3-0 nylon suture.  Dressing was placed and patient was placed back into a supine position, awakened from anesthesia and transferred to recovery room in stable condition. DD:  12/17/00 TD:  12/17/00 Job: [id number]

## 2010-12-05 NOTE — Discharge Summary (Signed)
NAME:  [name], [name]                     ACCOUNT NO.:  [id number]   MEDICAL RECORD NO.:  [id number]                   PATIENT TYPE:  INP   LOCATION:  2023                                 FACILITY:  [facility]   PHYSICIAN:  [name], M.D.                DATE OF BIRTH:  August 03, 1940   DATE OF ADMISSION:  02/10/2004  DATE OF DISCHARGE:  02/12/2004                                 DISCHARGE SUMMARY   HISTORY OF PRESENT ILLNESS:  The patient is a 70 year old white married  female patient of [name], M.D., who came into the hospital on February 10, 2004, with near syncope and chest pain.  She has a history of coronary  artery bypass grafting in 1997.  She has had multiple caths since, all with  patent grafts and no distal disease past her grafts.  She apparently had  weakness for one day.  The symptoms were getting worse, thus she was brought  to the emergency room by her husband.  She was found to be in atrial flutter  at a rate of 132.  She has known chronic atrial flutter/fibrillation on  anticoagulation.  She has not wanted to undergo cardioversion in the past.  Thus she is on rate control. She also has a history of LVD with an EF of  30%, however, last catheterization showed her EF was 55%. She has morbid  obesity, hyperlipidemia, hypertension, and COPD.   HOSPITAL COURSE:  She was brought into the hospital to rule out MI and also  for rate control.  She was put on IV Cardizem the following day.  She was  changed to p.o. Cardizem, prior to her admission she was on 128 mg of  Cardia. She was increased to 240 mg everyday.  Her rate came down.  Her  weakness improved.  Her CK-MBs were negative, her troponins were slightly  elevated.  She was seen by Gerlene Burdock A. Alanda Amass, M.D. on February 11, 2004.  He  discussed the case with Dr. Allyson Sabal and it was decided that she should have  outpatient Cardiolite, possible EP evaluation in the future, and her rate  will be controlled as an  outpatient.  She was also noted to have a rash on  her abdomen, thus dermatology consult was called.  She was seen by Rocco Serene, M.D.  She had bacterial folliculitis and furunculosis.  She was put  on Keflex 500 mg q.i.d. x10 days on the day of discharge.  He also suggested  that she wash the area with antibacterial soap on a daily basis.   On February 12, 2004, the nurses notes state that her heart rate did go down to  38 x1, however, this was during her sleeping.  Her rate maintained between  50 and 70 most of the rest of the night shift.  During the day, it was  anywhere from 76 to 90's.  She was seen by  Nicki Guadalajara, M.D. on February 12, 2004.  The patient was ready to go home.  Her weakness had resolved.  To  note, she is on multiple chronic pain medications.  She has a history of  fibromyalgia.  Her husband in the past has controlled these medications.  WE  did hold some of her other medications such as her Flexeril and decreased  her Celebrex.   LABORATORY DATA:  Hemoglobin 14.7, hematocrit 42, WBC 8.8, and platelets  214.  Her INR on the day of discharge was 3.1.  Her potassium was low on  admission at 3.1, it came up to 4.2 with potassium supplementation.  Sodium  137, potassium 4.2, BUN 14, creatinine 1.5, glucose 107.  TSH 1.088, total  cholesterol 117, triglycerides 124, HDL 49, and LDL 43.  Her Zocor was cut  back.   DISCHARGE MEDICATIONS:  1. Metoprolol 50 mg twice a day.  2. Zocor 10 mg at bedtime.  3. Aspirin 81 mg once a day.  4. Lasix 40 mg once a day.  5. Elavil 50 mg at bedtime.  6. Estradiol 1 mg once a day.  7. Oxycodone 20 mg one every 12 hours, previously she had been on 40 mg     every 12 hours.  8. Diltiazem 240 mg once a day.  9. Prevacid 30 mg twice a day.  10.      Celebrex 100 mg once a day.  11.      Xanax as taken previously when needed.  12.      Flexeril 10 mg four times a day as needed.  13.      Vicodin as needed.  14.      Keflex 500 mg every  six hours x10 days.  15.      Potassium 20 mEq once a day.  16.      Coumadin 2.5 mg everyday except Thursday 5 mg which was also     slightly decreased.   ACTIVITY:  As tolerated.  She should wash her effected rash area with  bacterial soap, either Lever 2000 or Cetaphil.  She will have her lipids and  liver function checked in two months.  She will have a Persantine Cardiolite  on August 1, at 1:15 and she will follow up with Dr. Allyson Sabal on August 22, at  11:30.   DISCHARGE DIAGNOSES:  1. Near syncope possibly related to atrial flutter with a rate of 132 noted     in the emergency room.  2. Chronic atrial flutter.  She notes that she has refused a [facility]     cardioversion in the past, she is on rate control.  3. Anticoagulation secondary to #2.  4. Chest pain, CK-MB's were negative, troponins were elevated. This was not     thought to be significant.  5. Fibromyalgia on chronic medications.  6. History of left ventricular dysfunction last catheterization, her     ejection fraction was up to 55% from 30% from the previous     catheterization in 2003.  7. Gastroesophageal reflux disease.  8. Morbid obesity.  9. Chronic pain on multiple medications.  10.      Polypharmacy.  Her husband does control her narcotic medications.  11.      Folliculitis and furunculosis.  12.      Hyperlipidemia.  13.      Chronic obstructive pulmonary disease.  14.      Morbid obesity.      Lezlie Octave, [place].P.  [name], M.D.    BB/MEDQ  D:  02/12/2004  T:  02/12/2004  Job:  [id number]   cc:   Rocco Serene, M.D.  [address] [place]  [place] [address]  Fax: [phone]   Lonzo Cloud. Kriste Basque, M.D. [facility]

## 2010-12-05 NOTE — Discharge Summary (Signed)
NAME:  [name], [name]                     ACCOUNT NO.:  [id number]   MEDICAL RECORD NO.:  [id number]                   PATIENT TYPE:  INP   LOCATION:  5725                                 FACILITY:  [facility]   PHYSICIAN:  Gordy Savers, M.D. [facility]      DATE OF BIRTH:  [birth year]   DATE OF ADMISSION:  11/16/2003  DATE OF DISCHARGE:  11/18/2003                                 DISCHARGE SUMMARY   FINAL DIAGNOSIS:  Coumadin coagulopathy.   ADDITIONAL DIAGNOSES:  1. Paroxysmal atrial fibrillation.  2. Left ventricular dysfunction.  3. Coronary artery disease.  4. Chronic obstructive pulmonary disease.  5. Hypertension.   HISTORY:  The patient is a 70 year old white female with multiple medical  problems who was noted to have an INR of 14 on the day of admission.  Two to  three days prior to admission she also fell, sustaining trauma to her left  anterior chest wall.  There has been no resulting ecchymosis or bruising.  An outpatient CT scan was performed that revealed no evidence of significant  trauma.  The patient was admitted for further observation and treatment of  her Coumadin coagulopathy.   LABORATORY DATA/HOSPITAL COURSE:  The patient was admitted to the hospital  where she was treated with oral subcutaneous vitamin K.  Preadmission  prothrombin time was 57.8 with an INR of 15.3.  PTT was 101.  CBC including  hemoglobin and hematocrit were normal.  The patient was observed and during  this period of observation had no signs of bleeding.  Serial INRs were  monitored and prior to her discharge were in a therapeutic range.  H&H also  remained normal.   DISPOSITION:  The patient will be discharged today.  Coumadin will be held  today but will be taking on Nov 19, 2003 and Nov 20, 2003 at a dose of 2.5  mg.  She has been asked to call tomorrow for an office visit on Nov 20, 2003.  An INR will be checked at that time and further adjustments made.  She will  be discharged on  her preadmission multiple medications.   CONDITION ON DISCHARGE:  Stable.                                                Gordy Savers, M.D. [facility]    PFK/MEDQ  D:  11/18/2003  T:  11/19/2003  Job:  [id number]

## 2010-12-05 NOTE — Discharge Summary (Signed)
NAME:  [name], [name]                     ACCOUNT NO.:  [id number]   MEDICAL RECORD NO.:  [id number]                   PATIENT TYPE:  INP   LOCATION:  4711                                 FACILITY:  [facility]   PHYSICIAN:  Abelino Derrick, P.A.                DATE OF BIRTH:  [birth year]   DATE OF ADMISSION:  07/07/2002  DATE OF DISCHARGE:  07/12/2002                                 DISCHARGE SUMMARY   DISCHARGE DIAGNOSES:  1. Atrial fibrillation, atrial flutter, rate controlled at discharge.  2. Coronary disease, coronary artery bypass grafting in 1997 with patent     grafts by catheterization this admission.  3. Left ventricular dysfunction with an ejection fraction of 30%.  4. Chronic pain syndrome.  5. Obesity.   HISTORY OF PRESENT ILLNESS:  The patient is a 70 year old female who had  bypass surgery x3 in 1997. She  had a catheterization done on May 24, 2000, which revealed patent grafts and circumflex narrowing of 60% which was  treated medically. This was an unprotected artery. She has used  nitroglycerin occasionally since. She was admitted on September 07, 2001,  with increasing chest pain, consistent with unstable angina. She was also  noted to be in atrial fibrillation.   She has a history of paroxysmal atrial fibrillation. She does have a history  of fibromyalgia and chronic pain and is on multiple analgesics at home  around the clock. She has had 5 back surgeries in the past. She is followed  at the pain clinic here in [place].   HOSPITAL COURSE:  The patient was admitted to telemetry and started on IV  nitroglycerin and IV heparin. Catheterization was done on July 07, 2002,  by Dr. Elsie Lincoln, which revealed patent SVG to the RCA, patent SVG to the  optional diagonal, patent LIMA to the LAD. There was no significant  circumflex disease noted. Ejection fraction was 30% with global hypokinesis.   She was initially put on IV Cardizem for rate control and this  was changed  to p.o. It was decided to start her on Coumadin. She was not  on this  prior  to  admission. Heparin was continued.   She did have some problems with pain management and  her medications had to  be adjusted for this. Ultimately it was decided to discharge her on the  24th.   Her INR was 2.3. She was discharged on Coumadin 2.5 on Monday, Wednesday and  Friday and 5 mg other days. She will follow up at the Coumadin clinic at  [facility] health care.   DISCHARGE MEDICATIONS:  1. Coumadin 5 mg Tuesday, Thursday, Saturday and Sunday, 2.5 mg Monday,     Wednesday and Friday.  2. Aspirin 81 mg q.d.  3. Lopressor 50 mg b.i.d.  4. Cardizem CD 120 q.d.  5. Pain medications as taken at home which include alprazolam 0.5 q.i.d.,     Elavil  100 mg q.h.s., Celebrex 100 mg b.i.d., Oxycontin 40 mg q.8h.  6. She was also on cyclobenzaprine 10 mg q.i.d.  7. Estradiol 1 mg q.d.  8. Zocor 20 mg q.h.s.  9. Prevacid 30 mg b.i.d.   LABORATORY DATA:  INR at discharge is 2.3.   Chest x-ray on the 19th shows cardiomegaly with early CHF. White count 6.5,  hemoglobin 10.0, hematocrit 29.4, platelets 104 at discharge. Sodium 135,  potassium 4.4, BUN 20, creatinine 1.9. Her creatinine ranged from 1.5 to  1.9. CK-MB and  troponins were  negative. ALP and AST were normal, ALT was  194. Lipid profile shows cholesterol 156, triglycerides 163, HDL 40, LDL 80.  TSH 5.01.   DISPOSITION:  The patient is discharged in stable condition and will follow  up with Dr. Allyson Sabal and Dr. Kriste Basque. She will have a protime checked at [facility].                                               Abelino Derrick, P.ALenard Lance  D:  08/31/2002  T:  08/31/2002  Job:  [id number]   cc:   Lonzo Cloud. Kriste Basque, M.D. [facility]   [name], M.D.  [phone]  [place]  [place] [address]  Fax: [phone]

## 2010-12-05 NOTE — Cardiovascular Report (Signed)
NAME:  [name], [name]                     ACCOUNT NO.:  [id number]   MEDICAL RECORD NO.:  [id number]                   PATIENT TYPE:  INP   LOCATION:  3728                                 FACILITY:  [facility]   PHYSICIAN:  [name], M.D.                DATE OF BIRTH:  [birth year]   DATE OF PROCEDURE:  DATE OF DISCHARGE:                              CARDIAC CATHETERIZATION   PROCEDURE PERFORMED:  Cardiac catheterization.   CARDIOLOGIST:  [name], M.D.   INDICATIONS FOR PROCEDURE:  [name] is a 70 year old female with a  history of CAD status post CABG in 1997, had a positive A flutter on  Coumadin anticoagulation.  She was cathed in December 2003 and found to have  patent graft.  Other problems include, hypertension, hyperlipidemia and  COPD.  She was scheduled for teeth extraction and was admitted for heparin  Coumadin crossover.  She developed chest pain prior to her procedure and  presents now for a diagnostic coronary arteriography.   PROCEDURE DESCRIPTION:  The patient was brought to the second [facility]  cardiac cath lab in the postabsorptive state.  She was premedicated with  p.o. Valium.  Her right groin was prepped and shaved in the usual sterile  fashion.  One percent Xylocaine was used for local anesthesia.  A 6 French  sheath was inserted into the right femoral artery using standard Seldinger  technique.  Six family history right and left Judkins diagnostic catheters  as well as a 6 French pigtail catheter were used for selective coronary  angiography, left ventriculography, selective vein graft angiography and  selective IMA angiography.  Omnipaque dye was used for the entirety of the  case.  Retrograde aortic, ventricular and pullback pressures were recorded.   HEMODYNAMIC DATA:  1. Aortic systolic pressure 259 and diastolic is 71.  2. Left ventricular systolic pressure 148 and diastolic pressure 35.   SELECTIVE CORONARY ANGIOGRAPHY:  1. Left  Main:  The left main had 50% diffuse stenosis.   1. LAD:  The LAD had a 90% ostial stenosis.  There is competitive flow in     its midportion.  The first diagonal branch, which arose as an optional     diagonal was widely patent with retrograde fill of the vein graft.   1. Left Circumflex:  Free of significant disease.   1. Right Coronary Artery:  A large dominant vessel with long segmental 95%     stenoses in the proximal and mid third.   1. Vein Grade to the PDA:  Widely patent.   1. LIMA to the LAD:  Widely patent.   LEFT VENTRICULOGRAPHY:  RAO left ventriculogram was performed using 25 mL of  Omnipaque dye at 12 mL per seconds.  The overall LVEF was estimated at  greater than 55% without focal wall motion abnormalities.   IMPRESSION:  [name] has patent grafts abdomen normal left  ventricular function.  I believe her chest pain is noncardiac and most  likely related to fibromyalgia.   The ACT was measured and the sheaths were removed.  Pressure was held on the  groin to achieve hemostasis.  The patient left the lab in stable condition.   PLAN:  Plans will be to reheparinize her this evening.  She is stable to  undergo her teeth extraction tomorrow, afterwards she will be replaced back  on heparin and re-Coumadinized because of her atrial flutter.  She left the  lab in stable condition.                                                 [name], M.D.    JB/MEDQ  D:  04/03/2003  T:  04/03/2003  Job:  [id number]   cc:   [facility]   [facility]  [address]  [place], [place] [address]   Lonzo Cloud. Kriste Basque, M.D. [facility]

## 2010-12-05 NOTE — Op Note (Signed)
[place]  Patient:    [name], [name]                  MRN: [id number] Proc. Date: 06/09/00 Adm. 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  POSTOPERATIVE DIAGNOSIS:  L4-5 unstable spondylolisthesis with stenosis, right L5-S1 spondylosis with an S1 radiculopathy.  PROCEDURE:  Decompressive laminectomy at L4-5, posterior lumbar interbody fusion at L4-5, interbody cages, _____ type, at L4-5, pedicle screw fixation L4-5, which is nonsegmented, right L5-S1 decompressive laminotomy and foraminotomies, posterolateral fusion L4 through S1 (two levels), autograft, allograft, open reduction and internal fixation of spondylolisthesis.  SURGEON:  Clydene Fake, M.D.  ASSISTANT:  Izell Maxeys. Elesa Hacker, M.D.  ANESTHESIA:  General endotracheal tube.  ESTIMATED BLOOD LOSS:  900 cc.  One unit of Cell Saver blood given.  DRAINS:  None.  COMPLICATIONS:  None.  REASON FOR PROCEDURE:  Patient is a 70 year old woman who has had severe back and bilateral leg pain, worse on the right side.  MRI and then myelogram was done showing prior surgery at right 4-5 with mild spondylolisthesis that does move with flexion-extension at 4-5, and stenosis there, and then at 5-1 on the right side disk bulge and spondylosis causing a right-sided foraminal narrowing and S1 root compression.  Patient brought in for decompression and fusion.  DESCRIPTION OF PROCEDURE:  The patient was brought in the operating room, and general anesthesia was induced.  Patient was placed in a prone position on a Wilson frame with all pressure points padded.  Patient was prepared in a sterile fashion, and the site of incision was injected with 20 cc 1% lidocaine with epinephrine.  Incision was then made centered at the site of a  previous scar in the midline of the lumbar spine, but this incision was extended in both directions cephalad and caudad.  Dissection was taken down to the fascia and hemostasis obtained with Bovie cauterization.  The fascia was incised, and subperiosteal dissection was done over the 3, 4, 5, and the sacrum over the spinous processes and laminae to the facets bilaterally.  Starting on the left, dissected out lateral to the facets, finding the transverse process of L4 and 5 and to the 5-1 facet.  This dissection was done out laterally, then on the right side.  We did note the scar tissue on the right at the 4-5 level. Hemostasis was obtained with Bovie cauterization.  Fluoroscopy was then used to confirm levels in L4.  Decompressive laminectomy was then performed, removing the inferior approach of L3 and superior approach of L5 also.  Care was taken to remove the scar tissue on the right side at 4-5.  Medial facetectomies were then performed bilaterally at 4-5.  Epidural hemostasis was obtained with bipolar cauterization.  The disk space was found at 4-5, first on the left side, and the disk space incised and diskectomy performed.  Disk was very tight at this point.  This had been repeated on the left side after carefully dissecting through the scar tissue.  We then placed Gelfoam and thrombin into the epidural space and went to the right L5-S1 level, where a semihemilaminectomy was performed with a drill and Kerrison punches.  Very thickened ligament was seen  and some spurs coming off the facet.  These were removed, and this decompressed the S1 root.  Foraminotomy over the S1 root was then done.  We did explore the disk space.  There was a mild bulge but no disk herniation, and the disk space was not entered.  Gelfoam and thrombin was placed in the epidural space there.  We then found the pedicles of L4 and L5 using fluoroscopy and by palpating the actual pedicle with the nerve hook and then  using a drill and awl and then a pedicle probe.  Pedicles were found at L4 and then at L5 bilaterally.  The probes were then removed, the hole was tapped, and a 6 x 45 mm _____ variable-axis screw was then placed.  AP and lateral imaging under fluoroscopy was done showing good position of the screws.  Two rods were cut to appropriate length and placed, one on the right, one on the left, into the pedicle screws, and they were placed on that loosely.  We then placed an 8 mm disk space spreader within the disk space and spread the 4-5 interspace and then tightened down the nuts on the pedicle screw system to hold this reduction.  This reduced the spondylolisthesis.  We then prepared the interspace for _____ cages by using the round and square broaches, and we packed to 9 x 9 mm _____ cages with autograft bone, which was bone removed from the laminectomy, which was chopped up into small pieces. To this, Grafton putty was added, and this bone was then packed into the cages.  After preparing the left interspace, the cage was tapped into place and confirmed in good position under fluoroscopic imaging.  This was repeated on the right side after placing bone anterior and around the first cage, the second cage was tapped into place.  One screw in each of the pedicle screw rod systems on each side was loosened and then with some compression over the system, they were re-tightened down.  AP and lateral imaging showed good position of screws, rods, and the interbody cages.  Fluoroscopy was removed from the field at this point.  Hemostasis in the epidural space was obtained with Gelfoam and thrombin, and the transverse processes of 4 and 5 lateral facets and then to the sacrum was then decorticated with a drill and the reset of the autograft and Grafton bone mixture were then placed into the lateral gutters.  We did have more bone on the left side than on the right.  This was done as a fusion from L4  through S1.  Retractors were removed, hemostasis was obtained with Bovie cauterization, and the paraspinous muscles and fascia were closed with 2-0 Vicryl interrupted sutures, and the subcutaneous tissue was  closed with 2-0 and 3-0 Vicryl interrupted suture, and the skin closed with a running 4-0 nylon suture.  Dressing was placed.  The patient was then placed back in a supine position, awakened from anesthesia, and transferred to the recovery room. DD:  06/09/00 TD:  06/11/00 Job: [id number]

## 2010-12-05 NOTE — Assessment & Plan Note (Signed)
[facility]                             PULMONARY OFFICE NOTE   [name], [name]                  MRN:          [id number]  DATE:09/03/2006                            DOB:          01/30/1941    HISTORY OF PRESENT ILLNESS:  The patient is a 70 year old white female  patient of Dr. Kriste Basque, who has a known history of COPD and asthmatic  bronchitis, and presents for an acute office visit.  The patient  complains over the last month she has had persistent cough, congestion,  sore throat and wheezing.  The patient was seen about six weeks ago by  Dr. Kriste Basque with an asthmatic bronchitic flare and started on Avalox.  She  reports symptoms did get better.  However, they never totally resolved  and, over the last week, they have worsened.  She has been coughing up  yellow-green sputum and also has complained of some mild urinary  urgency.  The patient denies any hemoptysis, orthopnea, PND or leg-  swelling.   PAST MEDICAL HISTORY:  Reviewed.   CURRENT MEDICATIONS:  Reviewed.   PHYSICAL EXAMINATION:  The patient is an obese female, in no acute  distress.  She is afebrile with stable vital signs; O2 saturation is 94%  on room air.  HEENT:  Unremarkable.  NECK:  The neck is supple without adenopathy, no JVD.  LUNGS:  The lung sounds reveal coarse breath sounds with a few scattered  wheezes.  CARDIAC:  No murmur noted.  ABDOMEN:  Soft and obese.  EXTREMITIES:  Warm without any calf cyanosis, clubbing or edema.   DATA:  Chest x-ray is pending at time of dictation.  Urinalysis is  unremarkable.   IMPRESSION AND PLAN:  Slow-to-resolve asthmatic bronchitic exacerbation.  The patient is to begin Levaquin 750 mg times five days.  Samples were  given.  The patient is to continue on Mucinex twice daily and begin a  prednisone taper in the next week.  The patient may use her Tussionex at  home as needed for cough control.  The patient will follow back up  with  Dr. Kriste Basque in two weeks, or sooner if needed.     Rubye Oaks, NP  Electronically Signed      Lonzo Cloud. Kriste Basque, MD  Electronically Signed   TP/MedQ  DD: 09/03/2006  DT: 09/03/2006  Job #: [id number]

## 2010-12-05 NOTE — Op Note (Signed)
NAME:  [name], [name]                     ACCOUNT NO.:  [id number]   MEDICAL RECORD NO.:  [id number]                   PATIENT TYPE:  INP   LOCATION:  3728                                 FACILITY:  [facility]   PHYSICIAN:  Dorthula Matas, D.D.S.           DATE OF BIRTH:  [birth year]   DATE OF PROCEDURE:  04/06/2003  DATE OF DISCHARGE:                                 OPERATIVE REPORT   PREOPERATIVE DIAGNOSIS:  Atrial flutter, previous bypass grafts, severe  dental disease.   POSTOPERATIVE DIAGNOSIS:  Atrial flutter, previous bypass grafts, severe  dental disease.   OPERATION:  Full mouth extraction.   SURGEON:  Dorthula Matas, D.D.S.   ANESTHESIA:  Anesthesia monitoring plus local anesthesia.   SPECIMENS:  Multiple teeth and parts thereof not submitted.   COMPLICATIONS:  None.   CULTURES:  None.   DRAINS:  None.   PROCEDURE:  The patient was brought to the OR and placed on the OR table in  supine position.  She then had adequate monitoring placed as well as a nasal  O2 prongs for delivery of oxygen throughout the case.  Local anesthetic was  administered as documented on the anesthesia chart.  This was done and a  right and left greater palatine nerve block, nasal palatine nerve block, and  infraorbital nerve blocks plus infiltration.  Also,  a left inferior  alveolar nerve block was given and left mental nerve block.  After the onset  of local anesthesia, an incision was made in the maxillary arch just  posterior to the most distal tooth on the upper right and extending into the  buccal sulcus across the anterior region in the buccal sulcus of teeth 8 and  9 and then across the left maxillary alveolar ridge to the left most  posterior tooth. A release incision was made distal to each tooth and a full  thickness mucoperiosteal flap was reflected.  At this point, the teeth were  mobilized with a 40 elevator and then removal attempted.  All the maxillary  teeth had  been crowned and the crowns fractured off during this attempt.  A  703 bur was then used to remove bone along the facial aspect of each of the  four remaining maxillary teeth.  Once these teeth had sufficient bone  removed to allow the forceps to be placed on the teeth structures, the teeth  were extracted with a 150 dental forceps.  Once this was done, alveoloplasty  was accomplished and then the area was curetted to be sure that no  particulate material or granulation tissue was left behind.  Then, the area  was copiously irrigated with normal saline and suctioned free of debris.  At  this point, Avitene was placed in each of the socket sites.  Once Avitene  was packed into the area, the sockets were closed tightly using 4-0 Recide  suture.  The suture was placed in an interrupted fashion  as well as in a  running fashion.  With the Avitene in place, there was good hemostasis  obtained.   Attention was then directed to the mandibular arch where tooth number 22 was  broken off to the gum line and there had previously been an abscess.  An  incision was made both proximal and distal to the tooth and into the buccal  sulcus of the remnants of the root.  Again, a full thickness mucoperiosteal  flap was reflected.  Bone was removed along the facial of this tooth number  22.  Tooth number 22 was delivered with an Ash bicuspid forceps.  The socket  was curetted out and irrigated with sterile saline.  The bon was smoothed  and then Avitene was placed in the socket and, again, close proximity of the  wound edges was obtained using 4-0 Recide suture, both in an interrupted  fashion as well as in a running fashion.   At this point, the surgical procedure was completed.  The patient had  previously had heparin stopped to allow adequate coagulation during the time  of the surgery.  One of the medical indicators for this patient to undergo  the surgery in the hospital setting was the need to do a  Coumadin heparin  cross over.  I had attempted to provide the patient's surgical care earlier  in the week, but she had surgery cancelled secondary to chest pains on  Tuesday, just before we were supposed to go to the operating room and then  today we had to modify our surgical plans due to the fact that the patient  had been on an n.p.o. status by orders, but yet was allowed some ice cream  approximately an hour from the scheduled surgical time.  This necessitated  the modification of the anesthesia but the patient did tolerate the local  anesthesia with anesthesia monitoring adequately.  The patient is to be  transferred directly from the operating room back to the cardiology floor  today.  I will follow her postoperatively and then cardiology will do the  heparin to Coumadin cross over prior to her release from the hospital.                                               Dorthula Matas, D.D.S.    SWS/MEDQ  D:  04/06/2003  T:  04/06/2003  Job:  [id number]

## 2010-12-05 NOTE — Cardiovascular Report (Signed)
NAME:  [name], [name]                     ACCOUNT NO.:  [id number]   MEDICAL RECORD NO.:  [id number]                   PATIENT TYPE:  INP   LOCATION:  1844                                 FACILITY:  [facility]   PHYSICIAN:  Madaline Savage, M.D.             DATE OF BIRTH:  [birth year]   DATE OF PROCEDURE:  07/07/2002  DATE OF DISCHARGE:                              CARDIAC CATHETERIZATION   PROCEDURE PERFORMED:  1. Selective coronary angiography by Judkins technique.  2. Retrograde left heart catheterization.  3. Left ventricular angiography.  4. Selective visualization of multiple saphenous vein grafts.  5. Selective visualization of the left internal mammary artery graft.  6. Abdominal aortography.   COMPLICATIONS:  None.   ENTRY SITE:  Right femoral.   DYE USED:  Omnipaque.   PATIENT PROFILE:  The patient is a 71 year old woman who has undergone  previous coronary artery bypass grafting sometime around 1997.  She presents  today with unstable chest pain that has been nitrate-responsive and has an  EKG showing interval onset of atrial flutter with a ventricular response of  100/minute since the last EKG by comparison.  She is brought to the  catheterization lab electively at this time for diagnostic cardiac  catheterization.   RESULTS:  1. Pressures     A. The left ventricular pressure was 148/11.     B. Central aortic pressure 148/87, mean of 112.     C. No aortic valve gradient by pullback technique.  2. Angiographic results     A. The left main coronary artery has been described as being 60%        stenosed.  On this study today, I see a smooth vessel of medium        length.  It is approximately 2.5 mm in diameter and approximately 2 mm        long.  The vessel is smooth.  I see no evidence of stenosis in the        proximal, mid, or distal portions of the vessel.  I do see a mild        degree of calcification near the left main but it is actually in the  LAD primarily.     B. The circumflex coronary artery is nondominant.  It gives rise to one        diagonal branch, a circumflex branch that courses through the        atrioventricular groove and a very large atrial circumflex branch.  No        lesions were seen in any of the vessels in the circumflex or its OM.     C. The LAD contains a stenosis of 75% in the ostium of the LAD before a        large optional diagonal branch arises off the LAD.  This vessel has        been bypass-grafted and is referred to  in the operative report as an        obtuse marginal.  In reality, this vessel comes off of the LAD.  It is        widely patent throughout its course and has a patent graft inserted        into the mid portion of the vessel.  The LAD courses to the cardiac        apex and gives rise to several diagonal branches.  The LAD is        relatively small.  No lesions are seen.     D. The right coronary artery contains a high-grade stenosis of 95% over a        long segment between acute marginal branch #1 and acute marginal        branch #2 which is approximately 25 mm in length.  The distal vessel        is slightly larger and not very diseased.  There is a patent saphenous        vein graft inserting into the distal RCA near the trifurcation point        of a PDA and several posterolateral branches.     E. The saphenous vein graft to the right coronary artery is widely patent        throughout and there is good distal runoff into the distal small        vessel.     F. The vein graft to the optional diagonal branch also known as the OM        as it is called in the operative report is widely patent throughout        its course.     G. The left internal mammary artery graft to LAD is also widely patent.        It is a small diameter LIMA inserting into a small diameter distal LAD        but both appear normal.     H. The left ventricle shows global hypokinesis.  Ejection fraction         estimate 30%.  There is mild regurgitation through the mitral        apparatus noted.        A. Abd aortography shows no evidence of abdominal aortic pathology.           The renal arteries are normal.  I do not visualize the common           iliacs very well.   FINAL DIAGNOSES:  1. Coronary artery disease     A. A 75% ostial left anterior descending artery stenosis.     B. A 90-95% stenosis of mid right coronary artery between acute marginal        branch #1 and acute marginal branch #2.     C. Left ventricular dysfunction.  Ejection fraction 30%.  2. Saphenous vein graft patent to right coronary artery.  3. Saphenous vein graft patent to optional diagonal and patent left internal     mammary artery graft to the mid to distal left anterior descending     artery.  4. Unremarkable abdominal aorta and renal arteries.   PLAN:  The patient's atrial flutter should now be managed.  We do not see  obstructive disease that can explain the patient's nitrate-responsive  symptoms.  The patient does have need of high doses of around-the-clock  painkillers and is in an outpatient pain clinic, and  some of her anterior  wall chest pain may be related to chest wall factors and fibromyalgia.   We will begin Coumadin in 24 hours and manage her atrial flutter from this  point on.                                               Madaline Savage, M.D.    WHG/MEDQ  D:  07/07/2002  T:  07/07/2002  Job:  [id number]   cc:   Gerlene Burdock A. Alanda Amass, M.D.  [phone]  [place]  [place] [address]  Fax: [phone]   Cardiac Catheterization Lab

## 2010-12-05 NOTE — Discharge Summary (Signed)
NAME:  [name], [name]                     ACCOUNT NO.:  [id number]   MEDICAL RECORD NO.:  [id number]                   PATIENT TYPE:  INP   LOCATION:  3728                                 FACILITY:  [facility]   PHYSICIAN:  [name], M.D.                DATE OF BIRTH:  [birth year]   DATE OF ADMISSION:  04/01/2003  DATE OF DISCHARGE:  04/10/2003                                 DISCHARGE SUMMARY   [name] came into the hospital for heparin Coumadin crossover  secondary to needing dental extractions done.  She also has a history of  atrial flutter on Coumadin with coronary artery disease.  She has an EF of  30% and mild mitral regurgitation.  She also has morbid obesity. Thus, she  came in the hospital for heparin Coumadin crossover prior to surgery.  Her  INR was 1.8 at the time of admission.  The following day it was 1.7.  She  was started on IV heparin.  It was planned for her to undergo the dental  extraction on April 03, 2003, however, she complained about chest pain.  Thus, she was taken to the catheterization lab for a catheterization.  This  was performed by Dr. Allyson Sabal.  Her three grafts were patent.  She had no  distal CAD past her grafts.  Thus the chest pain was nonischemic.  The  surgery was then performed on April 04, 2003.  She had removal of all  her remaining teeth number 4, 8, 9, 11, and 22.  This was done by Dr.  Bradly Chris.  Post procedure, she did very well.  She also has a problem with  chronic pain and fibromyalgia.  She was on multiple narcotic medications,  sedative medications and neuropathic pain medications.  These needed to be  held.  We decreased the doses of them.  She was started back on her IV  heparin.  Coumadin was started and by April 10, 2003, her INR was 2.3.  She was much more awake on decreased medications.  To note, she was very  somnolent for several days secondary to apparently too much medication.   DISCHARGE  MEDICATIONS:  1. Prevacid 30 mg twice a day.  2. Amitriptyline 50 mg at bedtime.  3. Zocor 20 mg at bedtime.  4. Estrace 1 mg once a day.  5. Lasix 40 mg once a day.  6. K-Dur 20 mEq once a day.  7. Colace 200 mg once a day.  8. Diltiazem CD 120 mg once a day.  9. Aspirin 81 mg once a day.  10.      Flexeril 10 mg four times a day when needed.  11.      Neurontin 300 mg four time per day.  12.      Xanax 0.25 mg two times a day when needed.  13.      Metoprolol 25 mg twice per day.  14.      Coumadin as taken previously.  15.      Oxycodone 20 mg twice a day.  16.      Albuterol MDI as needed.  17.      Amicar mouth rinse 10 mL swish and spit out, do not swallow, four     times a day x4 more days.   ACTIVITY:  As tolerated.   DIET:  She will be on a low saturated fat diet.   FOLLOW UP:  She will follow up with Dr. Allyson Sabal on May 02, 2003, at 11:15.  She will follow up with her dentist in one week.   LABORATORY DATA:  On the day of discharge her INR was 2.3.  Hemoglobin 10.6,  hematocrit 30.7, wbc 5.3, platelets 109.  Glycosylated hemoglobin was 5.8.  Sodium 135, potassium 5.5, glucose 75, BUN 18, creatinine 1.9.  Urine was  negative for any pathology.  She had no growth of her urine culture.   Chest x-ray showed stable findings of mild chronic bronchitis, no evidence  of acute disease.   Her telemetry showed atrial flutter, rate of 68.  Her blood pressure on the  day of discharge was 134/84.  Her temperature was 96.6.   DISCHARGE DIAGNOSES:  1. Status post heparin Coumadin crossover for dental extraction.  2. Status post dental extraction of multiple teeth.  She is now edentulous.  3. Atrial flutter.  4. Coronary artery disease, status post cardiac catheterization this     admission secondary to complaints of chest pain prior to having dental     extractions done with prior history of coronary artery bypass grafting in     1997.  All her grafts are patent and she had no  distal disease past her     grafts.  5. Left ventricular dysfunction with an ejection fraction of 30%.  6. Multiple back surgeries for chronic back pain.  7. Fibromyalgia.  8. History of chronic obstructive pulmonary disease, chronic bronchitis,     asthma.  9. Hypertension.  10.      Hyperlipidemia.  11.      Chronic pain on multiple narcotics, sedatives and neuropathic pain     medications needed to be decreased while in the hospital.  Apparently her     husband controls her medications.  We decreased these medications when     she was in the hospital after we found they are not always given in the     dosages that we were told originally.      Lezlie Octave, N.P.                        [name], M.D.    BB/MEDQ  D:  04/10/2003  T:  04/11/2003  Job:  [id number]   cc:   Dorthula Matas, D.D.S.  [address], [address]  [place]  [place] [address]  Fax: [phone]   Lonzo Cloud. Kriste Basque, M.D. [facility]   Areatha Keas, M.D.  [address]  [address]  [place]  [place] [address]  Fax: [phone]

## 2010-12-05 NOTE — H&P (Signed)
[place]  Patient:    [name], [name]                  MRN: [id number] Adm. Date:  [id number] Attending:  Colon Branch                         History and Physical  CHIEF COMPLAINT:  Right back and leg pain.  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  This has not improved, but actually has worsened in severity through this period of time.  She has been doing well as far as the fusion on x-rays and myelogram all show.  I think the position of cages and pedicle screws and good healing of the fusion mass.  The MRI also shows stenosis in the lamina of 5 and at 5-1, especially on the left side. The patient was treated conservatively with epidural injections which maybe gave a few days relief before pain recurred.  She underwent an MRI showing the stenosis at L5-S1 especially on the left and some symptoms have continued. The patient is being brought in for surgery for decompressive laminectomy, right 5-1 foraminotomy, and possible diskectomy to decompress the S1 root again.  PAST MEDICAL HISTORY:  Significant for heart disease.  PAST SURGICAL HISTORY:  Total bypass in 1997, knee surgery in 1989, four back surgeries above, two as mentioned above.  Hysterectomy in 1969.  Gastric fundoplications in the past.  MEDICATIONS:  1. Prevacid.  2. Xanax.  3. Guaifenesin.  4. Estradiol.  5. K-Dur.  6. Lasix.  7. Zocor.  8. Metoprolol.  9. Imdur. 10. Neurontin. 11. Celebrex. 12. Cyclobenzaprine nebulizer. 13. Elavil.  ALLERGIES:  No known drug allergies.  SOCIAL HISTORY:  She is a Futures trader, married.  Does not smoke or drink alcohol.  REVIEW OF SYSTEMS:  Otherwise negative.  FAMILY HISTORY:  Noncontributory.  PHYSICAL  EXAMINATION:  GENERAL:  The patient is pleasant, in some mild distress.  HEENT:  Unremarkable.  NECK:  Supple.  LUNGS:  Clear.  HEART:  Regular rate and rhythm.  ABDOMEN:  Soft and nontender.  EXTREMITIES:  Intact with no edema.  NEUROLOGICAL:  Positive straight leg raise on the right, negative on the leg. Sensation may be slightly decreased on the left L5 distribution and also some giveaway weakness in dorsiflexion bilaterally.  There is some decreased sensation in the right S1 distribution.  Can ambulate without a cane, though, she prefers to use a cane.  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  PLAN:  She will be admitted for a decompressive laminectomy. DD:  12/17/00 TD:  12/17/00 Job: [id number]

## 2010-12-05 NOTE — Assessment & Plan Note (Signed)
[name]                           GASTROENTEROLOGY OFFICE NOTE   [name], [name]                  MRN:          [id number]  DATE:03/24/2006                            DOB:          [birth year]    PROBLEM:  Constipation.   HISTORY OF PRESENT ILLNESS:  [name] is a 70 year old white female  referred through the courtesy of Dr. Kriste Basque for colonoscopy.  She has had  life-long constipation.  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  At the time she had suffered from ischemic colitis and had  a segmental colitis.  With a regimen of MiraLax 17 grams twice a day she is  now moving her bowels regularly.  She has no pain or bleeding.  Without  medicine she may go up to two weeks without a bowel movement.  She has had a  stool impaction in the past.   PAST MEDICAL HISTORY:  Arrhythmias, for which she takes Coumadin.  She has a  history of an MI.  She is status post cholecystectomy and CABG.  She suffers  from depression.   FAMILY HISTORY:  Heart disease in her mother, brother and sister.   MEDICATIONS:  1. Metoprolol.  2. Zocor.  3. Lasix.  4. Amitriptyline.  5. Estradiol.  6. Potassium.  7. Prevacid.  8. Alprazolam.  9. Cardia.  10.Neurontin.  11.Coumadin.  12.Mucinex.  13.Cyclobenzaprine.  14.Simvastatin.   ALLERGIES:  None.   SOCIAL HISTORY:  She does not smoke or drink.  She is married.  She is on  disability.   REVIEW OF SYMPTOMS:  Positive for joint pains, excessive thirst, back pain,  dyspnea on exertion and muscle cramps.   PHYSICAL EXAMINATION:  VITAL SIGNS:  Pulse 64, blood pressure 120/68, weight  217.  HEENT: EOMI. PERRLA. Sclerae are anicteric.  Conjunctivae are pink.  NECK:  Supple without thyromegaly, adenopathy or carotid bruits.  CHEST:  She has few dry rales in her anterior chest bilaterally.  CARDIAC:  Regular rhythm; normal S1 S2.  There are no murmurs, gallops or  rubs.  ABDOMEN:  Bowel sounds are normoactive.  Abdomen is soft, non-tender and non-  distended.  There are no abdominal masses, tenderness, splenic enlargement  or hepatomegaly.  EXTREMITIES:  Full range of motion.  No cyanosis, clubbing or edema.  RECTAL:  Deferred.   IMPRESSION:  1. Chronic idiopathic constipation.  2. History of ischemic colitis.  3. Coronary artery disease.   RECOMMENDATION:  1. Colonoscopy.  2. Continue MiraLax twice a day.                                   Barbette Hair. Arlyce Dice, MD,FACG   RDK/MedQ  DD:  03/24/2006  DT:  03/25/2006  Job #:  [id number]

## 2010-12-05 NOTE — Discharge Summary (Signed)
NAMEJERMAINE, THOLL           ACCOUNT NO.:  [id number]   MEDICAL RECORD NO.:  [id number]          PATIENT TYPE:  INP   LOCATION:  3729                         FACILITY:  [facility]   PHYSICIAN:  Richard A. Alanda Amass, M.D.DATE OF BIRTH:  Jan 30, 1941   DATE OF ADMISSION:  10/28/2004  DATE OF DISCHARGE:  10/31/2004                                 DISCHARGE SUMMARY   HISTORY OF PRESENT ILLNESS:  Ms. Harjot Zavadil is a 70 year old white  obese female patient of Dr. Clayborne Dana, who came into the hospital  because she was feeling light-headed and woozy.  She was last seen in our  office on October 23, 2004.  She has a history of CABG in 1997 and she had a  cath April 03, 2003, with a patent __________LV function.  She has  chronic atrial fibrillation, obesity, hypertension, fibromyalgia on multiple  medications for pain.  She was seen by Dr. Allyson Sabal on admission.  He felt she  was dehydrated and potassium level was low.  Her creatinine was elevated,  thus she was admitted.  She was hydrated.  Her potassium was supplemented.  Dr. Kriste Basque was called for consultation.  On October 31, 2004 she was seen by  Dr. Susa Griffins and was considered to be stable to be discharged home.  Blood pressure was 128/90, pulse was 73, respirations 20, temperature 97.9.  O2 saturation was 96%.   LABORATORY DATA:  Sodium was 135, potassium 3.9, BUN 23, creatinine 2, and  glucose was 112.  INR was 2.2.  Digoxin level was 0.5.  AST was 26, ALT was  12.  Magnesium 2.4.  INR on admission was therapeutic at 2.1.  Cranial CT  showed no acute intracranial abnormalities.  I did not see a chest x-ray.   DISCHARGE MEDICATIONS:  1.  Cardizem 180 mg once per day.  2.  Digoxin, she should stop.  3.  Zaroxolyn, she should stop.  4.  Lasix 40 mg twice per day.  5.  Potassium 20 mEq twice a day.   All other medicines stayed the same as she was prior to hospitalization.   FOLLOW UP:  She should call Dr. Allyson Sabal for an  appointment on Monday.  She  should have her BMET drawn on November 04, 2004.   DISCHARGE DIAGNOSES:  1.  Dehydration, improved.  2.  Hypokalemia, improved.  3.  Renal insufficiency.  4.  Chronic atrial fibrillation.  5.  Obesity.  6.  Anticoagulation.  7.  Fibromyalgia and chronic pain syndrome on multiple narcotic medications.  8.  Coronary artery disease, stable.  9.  Chronic obstructive pulmonary disease.  10. Obesity.       BB/MEDQ  D:  01/07/2005  T:  01/07/2005  Job:  [id number]   cc:   Lonzo Cloud. Kriste Basque, M.D. [facility]

## 2010-12-05 NOTE — H&P (Signed)
[place]. [facility]  Patient:    [name], [name]                  MRN: [id number] Adm. Date:  [id number] Disc. Date: [id number] Attending:  Colon Branch                         History and Physical  CHIEF COMPLAINT:  Back and bilateral leg pain.  HISTORY:  Patient is a 70 year old right-handed woman who has a couple-year history of low back problems with consistent low back pain and right leg pain. In March 2000, she had a right 4-5 diskectomy performed, never got resolution of the pain.  In August 2000, underwent the surgery again for recurrent disk herniation.  She had some mild and temporary improvement of the right leg pain.  For the last nine months, she has had worsening of low back and right leg pain, and over the last six months, has developed left leg pain.  The pain goes down an L5 and S1 type distribution.  Any type of activity makes the pain worse.  She can walk about 200 feet before she has to stop and rest because of the pain.  She has been on amitriptyline, Neurontin, Celebrex, muscle relaxers, and multiple pain medications - all of which have not really been helping the situation.  MRI and then a myelogram of the lumbar spine have been done, which shows ______ at 4-5 that does move with flexion-extension.  Prior surgery is seen there, but there is still foraminal narrowing or stenosis at 5-1.  There is a central bulge that is a little worse to the right and may compress the right S1 root, and there is right S1 root cutoff on the myelogram.  PAST MEDICAL HISTORY:  Significant for heart disease.  Previous operations include triple bypass surgery in 1997, knee surgery in 1989, three back surgeries as mentioned above, hysterectomy in 1969, gastric fundoplications.  CURRENT MEDICATIONS:  Prevacid, alprazolam, guaifenesin, estradiol, K-Dur, Lasix, Zocor, metoprolol, Imdur, amitriptyline, Neurontin, Celebrex, muscle relaxer, promethazine,  nebulizer inhalant p.r.n., and OxyContin p.r.n.  ALLERGIES:  No known drug allergies.  SOCIAL HISTORY:  She is a Futures trader, married.  Does not smoke or drink alcohol.  REVIEW OF SYSTEMS:  Otherwise negative.  FAMILY HISTORY:  Noncontributory.  PHYSICAL EXAMINATION:  GENERAL:  Patient is pleasant and in mild distress.  VITAL SIGNS:  Weight is 285, height 5 feet 8.5 inches.  Blood pressure 108/85, pulse 59, temperature 98.7.  HEENT:  Unremarkable.  NECK:  Supple.  LUNGS:  Clear.  HEART:  Regular rate and rhythm.  ABDOMEN:  Soft, nontender.  EXTREMITIES:  Intact.  No edema.  BACK AND NEUROLOGIC:  Range of motion of the back decreased in all directions secondary to increasing pain.  Some paraspinous muscle spasm with tenderness. Motor strength shows give out weakness on plantar flexion; otherwise, intact except some left weakness in extensor hallucis longus at 4+/5.  Sensation is decreased in the L5 and S1 distributions to light touch and pinprick, worse in the S1.  Decreased in the left L5 distribution to light touch and pinprick. Ankle reflexes are decreased bilaterally.  ASSESSMENT AND PLAN:  Patient with unstable ______ and stenosis at 4-5 and disk herniation at the right 5-1.  Patient will be admitted for decompression/fusion surgery with pedicle screws and interbody cages.  Note, because of her cardiac history, she has had a preoperative workup by her cardiologist, Dr.  Allyson Sabal, and even has undergone a cardiac catheterization in preparation for getting ready for this surgery.    DD:  06/09/00 TD:  06/09/00 Job: [id number]

## 2010-12-05 NOTE — Discharge Summary (Signed)
[place]. [facility]  Patient:    [name], [name]                  MRN: [id number] Adm. Date:  [id number] Disc. Date: [id number] Attending:  Colon Branch                           Discharge Summary  ADMISSION DIAGNOSES: 1. Unstable spondylolisthesis of L4-5. 2. L4-5 spinal stenosis. 3. Right L5-S1 spondylosis with an S1 radiculopathy.  DISCHARGE DIAGNOSES: 1. Unstable spondylolisthesis of L4-5. 2. L4-5 spinal stenosis. 3. Right L5-S1 spondylosis with an S1 radiculopathy.  PROCEDURES: 1. Decompressive laminectomy at L4-5 with a posterior interbody fusion at L4-5    with interbody cages, pedicle screw fixation. 2. Right L5-S1 decompressive lamina foraminotomy. 3. Posterolateral fusion of L4-S1 with autograft from the same incision. 4. Allograft and open reduction and internal fixation of spondylolisthesis.  REASON FOR ADMISSION:  The patient is a 70 year old woman who has had a history of low back problems with consistent low back pain and elaborate leg pain.  She has had two right L4-5 diskectomies.  The second helped.  She had about a year and a half ago.  The third one helped for a couple of months and then the pain recurred.  Over the last six months, she has been having bilateral leg pain.  Work-up showed unstable spondylolisthesis at L4-5 with also compression of the right S1 root due to a disk bulb rammed from a narrowing of lateral stenosis at the L5-S1 level.  The patient was brought in for surgery.  HOSPITAL COURSE:  The patient was brought in the day of surgery and underwent the procedures named above.  Postoperatively, she was transferred to the recovery room and then to the intensive care unit.  There she was watched for 24 hours and remained in stable condition.  She was moving her legs well.  She did complain of moderate incisional pain and her morphine PCA was changed to Dilaudid.  She was transferred to the floor on the first  postoperative day and continued doing well.  She started getting up with a brace and ambulating. She did have some confusion.  It was felt to be due to Dilaudid and this was discontinued until June 13, 2000, and she was placed on Percocet or Vicodin for pain control.  Meanwhile, she started eating well, was ambulating better and better.  She was working with physical therapy and occupational therapy.  She have vomiting on June 14, 2000.  On June 15, 2000, she had been afebrile.  She had been up ambulating, eating better, and moving around in bed extremely well.  The incision was clean, dry, and intact.  The patient will be discharged home in stable condition.  DISCHARGE MEDICATIONS:  The same as prehospitalization, except no Celebrex or OxyContin.  She can have the Percocet one to two p.o. q.4-6h. p.r.n.  We gave her 51 pills.  She does have Vicodin at home which she can use instead of the Percocet.  DIET:  As tolerated.  ACTIVITY:  She is to be up with brace at all times.  WOUND CARE:  The incision needs to remain dry, though she can shower if she covers the incision.  She is to clean the incision at least every other day with half-strength peroxide.  FOLLOW-UP:  The patient will be seen in our office in one week for suture removal. DD:  06/15/00 TD:  06/15/00 Job: [id number]

## 2010-12-05 NOTE — Cardiovascular Report (Signed)
[place]. [facility]  Patient:    [name], [name]                  MRN: [id number] Adm. Date:  [id number] Attending:  Colon Branch CC:         [name], M.D.  [name], M.D.  [name]   Cardiac Catheterization  INDICATIONS:  Ms. ______ [name] is a 70 year old female patient of Dr. Allyson Bowers who apparently underwent CBG surgery in June 1997, with a LIMA to the LAD, vein to the marginal, and vein to the right coronary artery.  Patient has had recurrent episodes of left precordial and left shoulder discomfort which is nitrate responsive.  She is in need for back surgery.  She is referred for definitive cardiac catheterization prior to planned surgery.  HEMODYNAMIC DATA:  Central aortic pressure is 130/65, left ventricular pressure 130/29.  ANGIOGRAPHIC DATA:  Left main coronary artery had diffuse narrowing of 70% proximally and bifurcated into an LAD and left circumflex system.  The LAD had 80% diffuse ostial stenosis.  An intermediate vessel seemed to arise just after this 80% stenosis, and seemed to arise off of the LAD.  The LAD gave rise to several septal perforating arteries and there was evidence for competitive filing in a mid LAD secondary to the LIMA vessel.  The intermediate vessel arose from the proximal LAD just beyond the 80% stenosis, just beyond the ostium of the LAD.  This intermediate vessel was otherwise angiographically normal distal to the graft insertion.  The ostium of the circumflex had 60% narrowing.  The circumflex gave rise to one major marginal vessel.  The right coronary artery was small, diffusely-diseased vessel with 99% mid stenosis.  The vessel was then totally occluded at the crux.  The vein graft to the right coronary artery was widely patent and anastomosed into a small, distal RCA system, which was free of significant disease.  The vein graft supplying the intermediate vessel was  widely patent.  Of note, this vessel supplied the intermediate vessel, which arose after the 80% stenosis at the ostium of the LAD, and as a result, the circumflex was in jeopardy due to this 80% ostial LAD stenosis.  The LIMA to the LAD was angiographically normal.  Biplane cineventriculography revealed preserved global LV function.  There was mild mid anterolateral hypocontractility.  Distal aortography did not demonstrate any significant aortoiliac disease. There was minimal narrowing of the right renal artery.  IMPRESSION: 1. Normal LV function with subtle mid anterolateral hypocontractility. 2. High-grade native coronary artery disease with 70% diffuse ostial left main    stenosis; 80% ostial LAD stenosis; 60% ostial circumflex stenosis; and 99%    mid and total occlusion of the distal right coronary artery. 3. Patent LIMA to the LAD. 4. Patent vein graft to the intermediate vessel. 5. Patent vein graft to the right coronary artery.  DISCUSSION:  [name] has patent grafts.  However, the intermediate vessel arise, apparently, from the proximal LAD just after the 80% stenosis at the ostium.  Consequently, with the 70% left main stenosis and the 60% ostial circumflex stenosis, the true circumflex territory, including the marginal vessel, is not bypassed.  Medical therapy will be recommended.  Cineangiograms will be reviewed with colleagues. DD:  05/24/00 TD:  05/25/00 Job: [id number]

## 2010-12-10 ENCOUNTER — Telehealth: Payer: Self-pay | Admitting: Pulmonary Disease

## 2010-12-10 MED ORDER — CIPROFLOXACIN HCL 500 MG PO TABS: 500.0000 mg | ORAL_TABLET | Freq: Two times a day (BID) | ORAL | Status: AC

## 2010-12-10 NOTE — Telephone Encounter (Signed)
Per SN: okay to change to cipro 500mg  #20, 1 po bid.    Called spoke with patient, she is aware.  rx sent to verified pharmacy.

## 2010-12-10 NOTE — Telephone Encounter (Signed)
Pt c/o increased sob, cough with yellow sputum and wheezing off and on for 1 month. She says she took Avelox about 3-4 weeks ago for bronchitis and doesn't feel this has cleared up completely. She says she has a lot of fatigue. Pt did not want to make an appt but will if absolutely necessary. Pls advise.

## 2010-12-19 ENCOUNTER — Other Ambulatory Visit: Payer: Self-pay | Admitting: Pulmonary Disease

## 2010-12-26 ENCOUNTER — Encounter: Payer: Self-pay | Admitting: Pulmonary Disease

## 2010-12-29 ENCOUNTER — Other Ambulatory Visit (INDEPENDENT_AMBULATORY_CARE_PROVIDER_SITE_OTHER): Payer: Medicare Other

## 2010-12-29 ENCOUNTER — Ambulatory Visit (INDEPENDENT_AMBULATORY_CARE_PROVIDER_SITE_OTHER): Payer: Medicare Other | Admitting: Pulmonary Disease

## 2010-12-29 ENCOUNTER — Encounter: Payer: Self-pay | Admitting: Pulmonary Disease

## 2010-12-29 DIAGNOSIS — K589 Irritable bowel syndrome without diarrhea: Secondary | ICD-10-CM

## 2010-12-29 DIAGNOSIS — E785 Hyperlipidemia, unspecified: Secondary | ICD-10-CM

## 2010-12-29 DIAGNOSIS — F411 Generalized anxiety disorder: Secondary | ICD-10-CM

## 2010-12-29 DIAGNOSIS — J449 Chronic obstructive pulmonary disease, unspecified: Secondary | ICD-10-CM

## 2010-12-29 DIAGNOSIS — J4489 Other specified chronic obstructive pulmonary disease: Secondary | ICD-10-CM

## 2010-12-29 DIAGNOSIS — K219 Gastro-esophageal reflux disease without esophagitis: Secondary | ICD-10-CM

## 2010-12-29 DIAGNOSIS — D649 Anemia, unspecified: Secondary | ICD-10-CM

## 2010-12-29 DIAGNOSIS — I4891 Unspecified atrial fibrillation: Secondary | ICD-10-CM

## 2010-12-29 DIAGNOSIS — I2510 Atherosclerotic heart disease of native coronary artery without angina pectoris: Secondary | ICD-10-CM

## 2010-12-29 DIAGNOSIS — G894 Chronic pain syndrome: Secondary | ICD-10-CM

## 2010-12-29 DIAGNOSIS — Z79899 Other long term (current) drug therapy: Secondary | ICD-10-CM

## 2010-12-29 DIAGNOSIS — M545 Low back pain: Secondary | ICD-10-CM

## 2010-12-29 DIAGNOSIS — I872 Venous insufficiency (chronic) (peripheral): Secondary | ICD-10-CM

## 2010-12-29 DIAGNOSIS — N259 Disorder resulting from impaired renal tubular function, unspecified: Secondary | ICD-10-CM

## 2010-12-29 LAB — IBC PANEL
Iron: 59 ug/dL (ref 42–145)
Saturation Ratios: 13.9 % — ABNORMAL LOW (ref 20.0–50.0)
Transferrin: 303.3 mg/dL (ref 212.0–360.0)

## 2010-12-29 LAB — CBC WITH DIFFERENTIAL/PLATELET
Basophils Absolute: 0.1 10*3/uL (ref 0.0–0.1)
Basophils Relative: 1.1 % (ref 0.0–3.0)
Eosinophils Absolute: 0.3 10*3/uL (ref 0.0–0.7)
Lymphocytes Relative: 33.3 % (ref 12.0–46.0)
MCHC: 33.7 g/dL (ref 30.0–36.0)
Neutrophils Relative %: 51.2 % (ref 43.0–77.0)
Platelets: 122 10*3/uL — ABNORMAL LOW (ref 150.0–400.0)
RBC: 3.56 Mil/uL — ABNORMAL LOW (ref 3.87–5.11)
WBC: 5.4 10*3/uL (ref 4.5–10.5)

## 2010-12-29 NOTE — Patient Instructions (Signed)
Today we updated your med list in EPIC...    Continue your current meds the same...  We rec increasing the MUCINEX to 1200mg  twice daily w/ lots of fluids for the thick sputum...  Today we did your follow up CXR & anemia blood work...    Please call the PHONE TREE in a few days for your results...    Dial [id number] & when prompted enter your patient number followed by the # symbol...    Your patient number is:  [id number]  Call for any questions...  Let's plan a follow up visit in 6 months, sooner if needed for problems.Marland KitchenMarland Kitchen

## 2010-12-29 NOTE — Progress Notes (Signed)
Subjective:    Patient ID: [name], female    DOB: 02/10/1941, 70 y.o.   MRN: [id number]  HPI 70 y/o WF here for a follow up visit... she has ASHD w/ prev CABG and chr AFib on coumadin followed by DrBerry... she also has FM/ chr pain syndrome followed by [name]... we have followed her primarily for COPD/ Asthmatic Bronchitis- see below...  ~ November 12, 2009:  she had knee surg 1/11 by [name]... then fell (tripped at home) 09/12/09 & hosp x4d> Xrays showed T4 compression... had f/u eval DrTooke w/ L2 compression, DDD, DJD> had epid steroid shots w/ coordination thru [facility] coumadin clinic, but developed large ecchymosis right post lumbar area... protime shows PT 67/ INR 6.6 & coumadin on hold per [facility]... we discussed the need for BMD testing & treatment> she will pursue this thru [facility] (prev DrRowe, now [employer]). Her breathing has been good since we added the SYMBICORT160> no exac, no need for antibiotics or prednisone...  ~  March 12, 2010:  she was hosp 6/11 by Marland Mcalpine for CP> no change in cath from 2009 w/ patent grafts, & meds adjusted w/ IMDUR started and she is improved... she is c/o bladder issues (?OB symptoms + urge incontinence etc)> we discussed trial of TOVIAZ 4mg  to start and appt w/ Urology for futher eval... mult other somatic complaints as well but breathing is stable;  unfortunately not able to lose signif weight despite trials of diets etc...   ~  July 02, 2010:  41mo ROV c/o incr congestion, cough w/ green mucus, & wants Avelox... similar exac 9/11 treated by TP w/ Avelox, Pred, Mucinex & resolved... she states the Toviaz helped & she saw DrPeterson for Urology who tried something else but she likes the Toviaz better & requests refill...  ~  December 29, 2010:  63mo ROV & c/o recurrent URI symptoms w/ cough, incr SOB, sore left lat ribs, green sputum, congestion, etc; treated w/ Avelox x2, then Cipro & no better she says; we discussed how the color comes from the  cellular elements in the sput & not germs per se; she is also c/o tired all the time, fatigue, sick all the time, etc; we decided to wait for fever, ch in sputum & then culture it...    She saw DrBerry for [facility] 1/12> stable from the Cards standpoint 7 no changes made... Unfortunately her wt is up to 271# & she reminded me that she had Bariatric surg yrs ago ([place] in the 70's) & lost >100# but grad put it all back on... We checked her labs & everything is about the same x B12 is borderline at 279 & we will rec OTC Vit B12 supplement po qd...   Problem List:  COPD (ICD-496) - ex smoker, quit 47yrs... uses nebulizer w/ ALBUTEROL Qid (using 1-2 daily) vs Proventil HFA, SYMBICORT 160- 2spBid & MUCINEX 1-2 Bid w/ fluids... baseline CXR shows prev CABG surg, borderline Cor, NAD... prev PFT's 12/05 showed more restriction (from effort and obesity) than obstruction... ~  CXR 10/09 in hosp showed biapical pleural thickening, NAD. ~  5/10:  bronchitic exas treated w/ Levaquin/ Medrol/ etc... 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 ~  12/10:  another exac requiring Levaquin/ Pred... we will add SYMBICORT160- 2sp Bid. ~  Improved w/ addition of Symbicort, but still c/o occas exac requiring antibiotics etc... ~  CXR 6/11 showed mild cardiomeg, s/p CABG, calcif Ao, clear/ NAD...  ARTERIOSCLEROTIC HEART DISEASE (ICD-414.00) -  followed by DrBerry & [facility]... s/p CABG in 1997...  on IMDUR 30mg /d, METOPROLOL 50mg Bid, LASIX 40mg Bid, and K20Bid... ~  NuclearStressTests in 8/05 showing infer scar and mild inferolat ischemia...  ~  2DEcho 10/07 showed EF=45-50% and DD...  ~  repeat Myoview in 9/08 showed no ischemia... ~  hosp 10/09 w/ CP- cath showed 90%proxLAD, normCIRC, subtotal midRCA, LIMA & 2 vein grafts patent;  LVF= 60% w/o regional wall motion abn... no change from 2004. ~  repeat cath 6/11 showed no change from 2009 and patent grafts...  ATRIAL FIBRILLATION (ICD-427.31) - on COUMADIN per DrBerry... ~   4/11: complic after epidural shot w/ large ecchymosis right lumbar area, PT INR was >6, managed by [facility].  VENOUS INSUFFICIENCY (ICD-459.81) - she follows low sodium diet. elevates legs, wears support hose occas... she is on LASIX 40mg Bid per Cards...  HYPERLIPIDEMIA (ICD-272.4) - on ZOCOR 20mg /d... ~  FLP 2/09 showed TChol 112, TG 76, HDL 43, LDL 54 ~  FLP 3/10 showed TChol 166, TG 137, HDL 40, LDL 98 ~  FLP 10/10 showed TChol 120, Tg 91, HDL 47, LDL 55 ~  12/11 & 7/62: not fasting for today's OV's... ~  4/12: note from [facility] showed TChol 99, TG 68, HDL 43, LDL 42 on Simva20.  MORBID OBESITY (ICD-278.01) - we have discussed diet and exercise program on numerous occas in the past and again today! ~  weight 2/10 = 250# ~  weight 7/10 = 262# ~  weight 8/10 = 245# ~  weight 12/10 = 266# "it's a fighting battle" ~  weight 4/11 = 262# ~  weight 8/11 = 256# ~  weight 12/11 = 263#... we reviewed diet + exercise thereapy. ~  Weight 6/12 = 271#  GERD (ICD-530.81) - on OMEPRAZOLE 20mg Bid... ?[facility] changed to Aciphex?  IBS (ICD-564.1) - last colonoscopy was 9/07 by Dorris Singh and showed hems only... 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  RENAL INSUFFICIENCY (ICD-588.9) - Creat in the 1.6-1.7 range... ~  labs 2/09 showed BUN= 16, Creat= 1.6 ~  labs 3/10 showed BUN= 20, Creat= 1.7 ~  labs 4/11 showed BUN= 15, Creat= 1.6 ~  labs in hosp 6/11 showed BUN= 9, Creat= 1.3 ~  Labs here 6/12 showed BUN= 21, Creat= 1.6  OTHER SYMPTOMS INVOLVING URINARY SYSTEM (ICD-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 she responded to TOVIAZ 8mg  for overact bladder symptoms & requests refill...  LOW BACK PAIN, CHRONIC (ICD-724.2) - eval and treated by Tamala Julian, now DrBeekman> "they do it all now" w/ rechecks Q3-65months... she takes OXYCODONE 5/325, NEURONTIN 600mg Bid, etc... COMPRESSION FRACTURE (ICD-829.0) - found to have compression T12 & L2 4/11 treated  by NS- DrTooke... CHRONIC PAIN SYNDROME (ICD-338.4) - managed by Eligha Bridegroom... 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 ~  labs 2/09 showed AlkPhos= 79 ~  labs 3/10 showed AlkPhos= 86 ~  labs 4/11 showed AlkPhos= 70 ~  labs 6/11 in hosp showed AlkPhos = 71 ~  Bone Scan 6/11 showed stable Paget's distal left tibia, compress fx L2, OA of both knees.  ANXIETY - on ALPRAZOLAM 0.5mg Qid...  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 Appendectomy   . Cholecystectomy   . Vesicovaginal fistula closure w/ tah   . Coronary artery bypass graft     Outpatient Encounter Prescriptions as of 12/29/2010  Medication Sig Dispense Refill  .  albuterol (PROVENTIL) (2.5 MG/3ML) 0.083% nebulizer solution Take 2.5 mg by nebulization every 6 (six) hours as needed.        . ALPRAZolam (XANAX) 0.5 MG tablet Take 0.5 mg by mouth 4 (four) times daily as needed.        Marland Kitchen amitriptyline (ELAVIL) 50 MG tablet TAKE 2 TABLETS BY MOUTH AT BEDTIME  60 tablet  1  . budesonide-formoterol (SYMBICORT) 160-4.5 MCG/ACT inhaler Inhale 2 puffs into the lungs 2 (two) times daily.        . cyclobenzaprine (FLEXERIL) 10 MG tablet Take 10 mg by mouth 3 (three) times daily as needed.        . Fesoterodine Fumarate (TOVIAZ) 8 MG TB24 Take 1 tablet by mouth daily as needed.        . furosemide (LASIX) 40 MG tablet TAKE 1 TABLET TWICE DAILY  60 tablet  1  . gabapentin (NEURONTIN) 600 MG tablet TAKE 1 TABLET BY MOUTH 4 TIMES A DAY AS DIRECTED  120 tablet  4  . KLOR-CON M20 20 MEQ tablet TAKE 1 TABLET TWICE DAILY  60 tablet  1  . metoprolol (LOPRESSOR) 50 MG tablet Take 50 mg by mouth 2 (two) times daily.        Marland Kitchen omeprazole (PRILOSEC) 20 MG capsule TAKE 1 CAPSULE TWICE DAILY  60 capsule  1  . oxyCODONE-acetaminophen (PERCOCET) 5-325 MG per tablet Take 1 tablet by mouth every 4  (four) hours as needed.        . promethazine (PHENERGAN) 25 MG tablet Take 25 mg by mouth every 6 (six) hours as needed.        . Pseudoephedrine-Guaifenesin (MUCINEX D) [phone] MG TB12 Take 1 tablet by mouth 2 (two) times daily.        . simvastatin (ZOCOR) 20 MG tablet Take 20 mg by mouth at bedtime.        Marland Kitchen warfarin (COUMADIN) 5 MG tablet Take 5 mg by mouth daily. As directed by Dr. Allyson Sabal       . isosorbide mononitrate (IMDUR) 30 MG 24 hr tablet Take 30 mg by mouth daily.          Allergies  Allergen Reactions  . Metolazone     REACTION: pt states very sens to Zaroxyln    Review of Systems         See HPI - all other systems neg except as noted... The patient complains of decreased hearing, dyspnea on exertion, headaches, incontinence, and depression.  The patient denies anorexia, fever, weight loss, weight gain, vision loss, hoarseness, chest pain, syncope, peripheral edema, prolonged cough, hemoptysis, abdominal pain, melena, hematochezia, severe indigestion/heartburn, hematuria, muscle weakness, suspicious skin lesions, transient blindness, difficulty walking, unusual weight change, abnormal bleeding, enlarged lymph nodes, and angioedema.    Objective:   Physical Exam     WD, Obese, 70 y/o WF in NAD... GENERAL:  Alert & oriented; pleasant & cooperative... HEENT:  [place]/AT, EOM- full, PERRLA, EACs-clear, TMs-wnl, NOSE-clear, THROAT-clear & wnl. NECK:  Supple w/ fairROM; no JVD; normal carotid impulses w/o bruits; no thyromegaly or nodules palpated; no lymphadenopathy. CHEST:  Coarse BS w/ no wheezing, no rales, no signs of consolidation... HEART:  Regular Rhythm; without murmurs/ rubs/ or gallops heard... ABDOMEN:  Obese, soft & nontender; normal bowel sounds; no organomegaly or masses detected. EXT: without deformities, mild arthritic changes; no varicose veins/ +venous insuffic/ 1+ edema. NEURO:  no focal neuro deficits... DERM:  along right lateral chest wall, under breast &  over the costal margin- scarring in skin from shingles rash...   Assessment & Plan:   COPD>  Stable overall despite her chronic symptoms> continue NEBS, Symbicort, Mucinex, Fluids;  I told her no more antibiotics w/o approp culture data...  ASHD/ CAD/ s/p CABG>  Followed by Marland Mcalpine [facility]; continue same meds...  AFIB>  On Coumadin per [facility], continue same Rx...  HYPERLIPIDEMIA>  FLP looked good per DBerry's note; continue Simva20...  OBESITY>  We reviewed diet & exercise program; recall remote hx of bariatric surg...  Renal Insuffic>  Creat= 1.6 on her current regimen...  ORTHO>  LBP/ Compression fx/ FM/ Paget's/ Chr Pain Syndrome> managed by [name] at [facility] he took over for [facility]...  Anxiety>  On Alpraz prn.Marland KitchenMarland Kitchen

## 2010-12-31 LAB — PROTEIN ELECTROPHORESIS, SERUM
Albumin ELP: 53.6 % — ABNORMAL LOW (ref 55.8–66.1)
Beta 2: 15.5 % — ABNORMAL HIGH (ref 3.2–6.5)
Beta Globulin: 6.1 % (ref 4.7–7.2)
Gamma Globulin: 12 % (ref 11.1–18.8)

## 2011-01-02 ENCOUNTER — Telehealth: Payer: Self-pay | Admitting: Pulmonary Disease

## 2011-01-02 NOTE — Telephone Encounter (Signed)
Called and spoke with pt.  Answered her questions regarding her stool card and referred her to the directions on the card for how to correctly collect stool samples.  Pt verbalized understanding. Nothing further needed.

## 2011-01-04 ENCOUNTER — Encounter: Payer: Self-pay | Admitting: Pulmonary Disease

## 2011-01-09 ENCOUNTER — Other Ambulatory Visit: Payer: Self-pay | Admitting: *Deleted

## 2011-01-12 ENCOUNTER — Telehealth: Payer: Self-pay | Admitting: Pulmonary Disease

## 2011-01-12 NOTE — Telephone Encounter (Signed)
Mailed labs.[name]

## 2011-01-19 ENCOUNTER — Other Ambulatory Visit: Payer: Medicare Other

## 2011-01-19 ENCOUNTER — Other Ambulatory Visit: Payer: Self-pay | Admitting: Pulmonary Disease

## 2011-01-19 DIAGNOSIS — Z1289 Encounter for screening for malignant neoplasm of other sites: Secondary | ICD-10-CM

## 2011-01-19 LAB — HEMOCCULT SLIDES (X 3 CARDS)
Fecal Occult Blood: NEGATIVE
OCCULT 2: NEGATIVE
OCCULT 3: NEGATIVE
OCCULT 4: NEGATIVE
OCCULT 5: NEGATIVE

## 2011-02-17 ENCOUNTER — Telehealth: Payer: Self-pay | Admitting: Pulmonary Disease

## 2011-02-17 NOTE — Telephone Encounter (Signed)
Pt states she is having hot/cold flashes, hurting all over, light headed, hoarseness, and sore throat for approx 3-4 days now. She has not checked for any fever. Pelase advise.

## 2011-02-18 MED ORDER — CEPHALEXIN 500 MG PO CAPS: 500.0000 mg | ORAL_CAPSULE | Freq: Four times a day (QID) | ORAL | Status: AC

## 2011-02-18 NOTE — Telephone Encounter (Signed)
Spoke with pt and notified of recs per SN. Pt verbalized understanding and rx was sent to pharm.

## 2011-02-18 NOTE — Telephone Encounter (Signed)
Patient calling back.  Call on [phone]

## 2011-02-18 NOTE — Telephone Encounter (Signed)
Per SN----ok to give keflex 500mg    #28  1 po qid.   thanks

## 2011-02-20 ENCOUNTER — Other Ambulatory Visit: Payer: Self-pay | Admitting: Pulmonary Disease

## 2011-02-27 ENCOUNTER — Other Ambulatory Visit: Payer: Self-pay | Admitting: Pulmonary Disease

## 2011-03-13 ENCOUNTER — Telehealth: Payer: Self-pay | Admitting: Pulmonary Disease

## 2011-03-13 MED ORDER — AZITHROMYCIN 250 MG PO TABS: ORAL_TABLET | ORAL | Status: AC

## 2011-03-13 NOTE — Telephone Encounter (Signed)
Called, spoke with pt.  C/o chest congestion and goes into back, prod cough with greenish yellow mucus, sore throat, some wheezing.  Denies increased SOB, f/c/s.  Started yesterday morning.  Requesting abx -- not using anything otc.  Allergies verified.  [facility] [place].  Dr. Kriste Basque, pls advise.  Thanks!

## 2011-03-13 NOTE — Telephone Encounter (Signed)
Per SN---ok for pt to have zpak  #1  Take as directed with no refills. This has been sent to the pharmacy and pt is aware.

## 2011-03-27 ENCOUNTER — Other Ambulatory Visit: Payer: Self-pay | Admitting: *Deleted

## 2011-03-27 MED ORDER — ALPRAZOLAM 0.5 MG PO TABS: 0.5000 mg | ORAL_TABLET | Freq: Four times a day (QID) | ORAL | Status: DC | PRN

## 2011-04-03 ENCOUNTER — Ambulatory Visit (HOSPITAL_COMMUNITY)
Admission: RE | Admit: 2011-04-03 | Discharge: 2011-04-03 | Disposition: A | Discharge status: Home / Self Care | Payer: Medicare Other | Source: Ambulatory Visit | Attending: Specialist | Admitting: Specialist

## 2011-04-03 DIAGNOSIS — M79609 Pain in unspecified limb: Secondary | ICD-10-CM

## 2011-04-20 LAB — BASIC METABOLIC PANEL WITH GFR
BUN: 15
CO2: 31
Calcium: 8.5
Chloride: 100
Creatinine, Ser: 1.65 — ABNORMAL HIGH
GFR calc non Af Amer: 31 — ABNORMAL LOW
Glucose, Bld: 85
Potassium: 3.9
Sodium: 138

## 2011-04-20 LAB — COMPREHENSIVE METABOLIC PANEL WITH GFR
ALT: 16
AST: 26
Albumin: 3.9
Alkaline Phosphatase: 87
BUN: 20
CO2: 24
Calcium: 9
Chloride: 104
Creatinine, Ser: 1.53 — ABNORMAL HIGH
GFR calc non Af Amer: 34 — ABNORMAL LOW
Glucose, Bld: 98
Potassium: 4.1
Sodium: 136
Total Bilirubin: 1
Total Protein: 7.1

## 2011-04-20 LAB — CBC
HCT: 37
MCHC: 33.3
Platelets: 109 — ABNORMAL LOW
Platelets: 90 — ABNORMAL LOW
Platelets: 91 — ABNORMAL LOW
RDW: 15
RDW: 15.2
RDW: 15.2
WBC: 4.4
WBC: 5.1

## 2011-04-20 LAB — PROTIME-INR
INR: 1.3
INR: 1.4
INR: 1.5
Prothrombin Time: 16.1 — ABNORMAL HIGH
Prothrombin Time: 17.3 — ABNORMAL HIGH
Prothrombin Time: 18.7 — ABNORMAL HIGH

## 2011-04-20 LAB — HEPARIN LEVEL (UNFRACTIONATED)
Heparin Unfractionated: 0.32
Heparin Unfractionated: 1.07 — ABNORMAL HIGH

## 2011-04-20 LAB — PLATELET COUNT: Platelets: 103 — ABNORMAL LOW

## 2011-04-29 ENCOUNTER — Other Ambulatory Visit: Payer: Self-pay | Admitting: Pulmonary Disease

## 2011-05-01 LAB — I-STAT 8, (EC8 V) (CONVERTED LAB)
Acid-Base Excess: 4 — ABNORMAL HIGH
Chloride: 102
HCT: 45
Operator id: 272551
Potassium: 3.9
pH, Ven: 7.367 — ABNORMAL HIGH

## 2011-05-01 LAB — CARDIAC PANEL(CRET KIN+CKTOT+MB+TROPI)
CK, MB: 1.1
Relative Index: INVALID
Relative Index: INVALID
Total CK: 36
Troponin I: 0.01
Troponin I: 0.01

## 2011-05-01 LAB — CBC
HCT: 39.5
MCHC: 34.3
Platelets: 155
Platelets: 164
RDW: 14
RDW: 14.1 — ABNORMAL HIGH

## 2011-05-01 LAB — COMPREHENSIVE METABOLIC PANEL
BUN: 27 — ABNORMAL HIGH
Calcium: 9.3
Glucose, Bld: 107 — ABNORMAL HIGH
Total Protein: 7.9

## 2011-05-01 LAB — CK TOTAL AND CKMB (NOT AT ARMC)
Relative Index: INVALID
Total CK: 36

## 2011-05-01 LAB — DIFFERENTIAL
Basophils Relative: 1
Lymphs Abs: 2.2
Monocytes Relative: 11
Neutro Abs: 2.4
Neutrophils Relative %: 44

## 2011-05-01 LAB — APTT: aPTT: 48 — ABNORMAL HIGH

## 2011-05-01 LAB — POCT I-STAT CREATININE: Operator id: 272551

## 2011-05-01 LAB — B-NATRIURETIC PEPTIDE (CONVERTED LAB): Pro B Natriuretic peptide (BNP): 206 — ABNORMAL HIGH

## 2011-05-01 LAB — TROPONIN I: Troponin I: 0.01

## 2011-05-01 LAB — POCT CARDIAC MARKERS: Troponin i, poc: <0.05

## 2011-05-02 ENCOUNTER — Other Ambulatory Visit: Payer: Self-pay | Admitting: Pulmonary Disease

## 2011-05-08 ENCOUNTER — Telehealth: Payer: Self-pay | Admitting: Pulmonary Disease

## 2011-05-08 MED ORDER — PROMETHAZINE HCL 25 MG PO TABS: 25.0000 mg | ORAL_TABLET | Freq: Four times a day (QID) | ORAL | Status: DC | PRN

## 2011-05-08 NOTE — Telephone Encounter (Signed)
Refill has been sent to the pts pharmacy and pt is aware.

## 2011-05-21 ENCOUNTER — Other Ambulatory Visit: Payer: Self-pay | Admitting: Pulmonary Disease

## 2011-06-09 ENCOUNTER — Ambulatory Visit: Payer: Medicare Other | Admitting: Pulmonary Disease

## 2011-06-24 ENCOUNTER — Other Ambulatory Visit: Payer: Self-pay | Admitting: Pulmonary Disease

## 2011-06-29 ENCOUNTER — Telehealth: Payer: Self-pay | Admitting: Pulmonary Disease

## 2011-06-29 MED ORDER — AMOXICILLIN-POT CLAVULANATE 875-125 MG PO TABS: 1.0000 | ORAL_TABLET | Freq: Two times a day (BID) | ORAL | Status: AC

## 2011-06-29 NOTE — Telephone Encounter (Signed)
Per SN--ok for augmentin 875mg   #20  1 po bid and increase the mucinex 2 po bid with plenty of fluids and tylenol 2 qid.  thanks

## 2011-06-29 NOTE — Telephone Encounter (Signed)
I spoke with [name] and she c/o headache, coughing w/ green phlem, body aches, sore throat, feels tired, nausea but no vomiting x Friday night. [name] denies any fever. [name] has been doing her neb tx's and using her hydromet cough syrup. [name] is requesting further recs from Dr. Kriste Basque. Please advise thanks  Allergies  Allergen Reactions  . Metolazone     REACTION: [name] states very sens to Zaroxyln

## 2011-06-29 NOTE — Telephone Encounter (Signed)
t aware of SN recs and rx's have been sent

## 2011-06-30 ENCOUNTER — Ambulatory Visit: Payer: Medicare Other | Admitting: Pulmonary Disease

## 2011-07-01 ENCOUNTER — Other Ambulatory Visit: Payer: Self-pay | Admitting: Pulmonary Disease

## 2011-07-07 ENCOUNTER — Other Ambulatory Visit: Payer: Self-pay | Admitting: *Deleted

## 2011-07-07 MED ORDER — ALPRAZOLAM 0.5 MG PO TABS: 0.5000 mg | ORAL_TABLET | Freq: Four times a day (QID) | ORAL | Status: DC | PRN

## 2011-07-08 ENCOUNTER — Other Ambulatory Visit: Payer: Self-pay | Admitting: Allergy

## 2011-07-15 ENCOUNTER — Telehealth: Payer: Self-pay | Admitting: Pulmonary Disease

## 2011-07-15 MED ORDER — AMOXICILLIN-POT CLAVULANATE 875-125 MG PO TABS: ORAL_TABLET | ORAL | Status: DC

## 2011-07-15 MED ORDER — PREDNISONE 10 MG PO TABS: ORAL_TABLET | ORAL | Status: DC

## 2011-07-15 NOTE — Telephone Encounter (Signed)
augmentin x 5 days Prednisone 10 mg take  4 each am x 2 days,   2 each am x 2 days,  1 each am x2days and stop  Delsym is the only cough med I rec s ov

## 2011-07-15 NOTE — Telephone Encounter (Signed)
Spoke with pt husband,pt has a productive cough,(greenish yellow)  Body aches and pains.Increase wheezing,sob,chest tightness. Denies any fever. Finished rx of augmentin.and cough syrup. Also has been doing her neb tx 3-4  Times day. [name] not in office today Dr Sherene Sires Please advise. Allergies  Allergen Reactions  . Metolazone     REACTION: pt states very sens to [employer] [address].

## 2011-07-15 NOTE — Telephone Encounter (Signed)
Pt spouse is aware of MW recs. rx's have been sent to the pharmacy

## 2011-07-23 ENCOUNTER — Telehealth: Payer: Self-pay | Admitting: Pulmonary Disease

## 2011-07-23 NOTE — Telephone Encounter (Signed)
Called and spoke with pt.  Informed her of SN's recs. Pt was ok with these recs and will keep pending appt.

## 2011-07-23 NOTE — Telephone Encounter (Signed)
Per SN---very common with this illness going around--recs are to increase rest, hot soaks, tylenol 2 po qid and to keep her appt on 1/8 with SN.    thanks

## 2011-07-23 NOTE — Telephone Encounter (Signed)
I spoke with pt and she c/o body aches all over, wheezing, chest tightness, cough w/ yellow-green phlem after using her neb tx's,  x 2 weeks. Pt has finished a pred taper and a round of Augmentin. Pt states she is taking oxycodone for the pain. Pt has an OV w/ SN on 07/28/11 at 11:30. Pt is requesting recs. Please advise, thanks  Allergies  Allergen Reactions  . Metolazone     REACTION: pt states very sens to Zaroxyln

## 2011-07-23 NOTE — Telephone Encounter (Signed)
ATC pt.  Line rang x 13 with no answer.  WCB

## 2011-07-28 ENCOUNTER — Ambulatory Visit (INDEPENDENT_AMBULATORY_CARE_PROVIDER_SITE_OTHER): Payer: Medicare Other | Admitting: Pulmonary Disease

## 2011-07-28 ENCOUNTER — Ambulatory Visit (INDEPENDENT_AMBULATORY_CARE_PROVIDER_SITE_OTHER)
Admission: RE | Admit: 2011-07-28 | Discharge: 2011-07-28 | Disposition: A | Discharge status: Home / Self Care | Payer: Medicare Other | Source: Ambulatory Visit | Attending: Pulmonary Disease | Admitting: Pulmonary Disease

## 2011-07-28 ENCOUNTER — Encounter: Payer: Self-pay | Admitting: Pulmonary Disease

## 2011-07-28 DIAGNOSIS — K219 Gastro-esophageal reflux disease without esophagitis: Secondary | ICD-10-CM

## 2011-07-28 DIAGNOSIS — IMO0001 Reserved for inherently not codable concepts without codable children: Secondary | ICD-10-CM

## 2011-07-28 DIAGNOSIS — G894 Chronic pain syndrome: Secondary | ICD-10-CM

## 2011-07-28 DIAGNOSIS — M545 Low back pain, unspecified: Secondary | ICD-10-CM

## 2011-07-28 DIAGNOSIS — N259 Disorder resulting from impaired renal tubular function, unspecified: Secondary | ICD-10-CM

## 2011-07-28 DIAGNOSIS — J449 Chronic obstructive pulmonary disease, unspecified: Secondary | ICD-10-CM

## 2011-07-28 DIAGNOSIS — I872 Venous insufficiency (chronic) (peripheral): Secondary | ICD-10-CM

## 2011-07-28 DIAGNOSIS — I2510 Atherosclerotic heart disease of native coronary artery without angina pectoris: Secondary | ICD-10-CM

## 2011-07-28 DIAGNOSIS — E785 Hyperlipidemia, unspecified: Secondary | ICD-10-CM

## 2011-07-28 DIAGNOSIS — F411 Generalized anxiety disorder: Secondary | ICD-10-CM

## 2011-07-28 DIAGNOSIS — I4891 Unspecified atrial fibrillation: Secondary | ICD-10-CM

## 2011-07-28 DIAGNOSIS — K589 Irritable bowel syndrome without diarrhea: Secondary | ICD-10-CM

## 2011-07-28 DIAGNOSIS — E538 Deficiency of other specified B group vitamins: Secondary | ICD-10-CM

## 2011-07-28 DIAGNOSIS — J4489 Other specified chronic obstructive pulmonary disease: Secondary | ICD-10-CM

## 2011-07-28 DIAGNOSIS — T148XXA Other injury of unspecified body region, initial encounter: Secondary | ICD-10-CM

## 2011-07-28 NOTE — Patient Instructions (Signed)
Today we updated your med list in our EPIC system...    Continue your current medications the same...  Today we did your follow up CXR...    Please return to our lab one morning soon for your FASTING blood work...    Then call the PHONE TREE in a few days for your results...    Dial [id number] & when prompted enter your patient number followed by the # symbol...    Your patient number is:  [id number]#  Let's get on track w/ our diet & exercise program...    The goal is to lose 15-20 lbs...  Call for any questions...  Let's continue our 6 month follow up visits.Marland KitchenMarland Kitchen

## 2011-07-29 ENCOUNTER — Other Ambulatory Visit (INDEPENDENT_AMBULATORY_CARE_PROVIDER_SITE_OTHER): Payer: Medicare Other

## 2011-07-29 DIAGNOSIS — F411 Generalized anxiety disorder: Secondary | ICD-10-CM

## 2011-07-29 DIAGNOSIS — E785 Hyperlipidemia, unspecified: Secondary | ICD-10-CM

## 2011-07-29 DIAGNOSIS — I4891 Unspecified atrial fibrillation: Secondary | ICD-10-CM

## 2011-07-29 DIAGNOSIS — E538 Deficiency of other specified B group vitamins: Secondary | ICD-10-CM

## 2011-07-29 DIAGNOSIS — K589 Irritable bowel syndrome without diarrhea: Secondary | ICD-10-CM

## 2011-07-29 LAB — HEPATIC FUNCTION PANEL
AST: 24 U/L (ref 0–37)
Bilirubin, Direct: 0.2 mg/dL (ref 0.0–0.3)
Total Bilirubin: 1.1 mg/dL (ref 0.3–1.2)

## 2011-07-29 LAB — LIPID PANEL
Cholesterol: 110 mg/dL (ref 0–200)
LDL Cholesterol: 43 mg/dL (ref 0–99)
Total CHOL/HDL Ratio: 2
VLDL: 19.6 mg/dL (ref 0.0–40.0)

## 2011-07-29 LAB — CBC WITH DIFFERENTIAL/PLATELET
Basophils Relative: 0.8 % (ref 0.0–3.0)
Eosinophils Absolute: 0.3 10*3/uL (ref 0.0–0.7)
Eosinophils Relative: 6.5 % — ABNORMAL HIGH (ref 0.0–5.0)
HCT: 37.5 % (ref 36.0–46.0)
Lymphs Abs: 1.7 10*3/uL (ref 0.7–4.0)
MCHC: 33.4 g/dL (ref 30.0–36.0)
MCV: 93.9 fl (ref 78.0–100.0)
Monocytes Absolute: 0.6 10*3/uL (ref 0.1–1.0)
Platelets: 141 10*3/uL — ABNORMAL LOW (ref 150.0–400.0)
RBC: 3.99 Mil/uL (ref 3.87–5.11)
WBC: 5.1 10*3/uL (ref 4.5–10.5)

## 2011-07-29 LAB — BASIC METABOLIC PANEL
BUN: 15 mg/dL (ref 6–23)
Chloride: 105 mEq/L (ref 96–112)
GFR: 32.71 mL/min — ABNORMAL LOW (ref >60.00)
Potassium: 4.4 mEq/L (ref 3.5–5.1)

## 2011-07-30 ENCOUNTER — Other Ambulatory Visit: Payer: Self-pay | Admitting: Pulmonary Disease

## 2011-07-31 ENCOUNTER — Other Ambulatory Visit: Payer: Self-pay | Admitting: Pulmonary Disease

## 2011-08-09 ENCOUNTER — Encounter: Payer: Self-pay | Admitting: Pulmonary Disease

## 2011-08-09 NOTE — Progress Notes (Signed)
Subjective:    Patient ID: [name], female    DOB: [birth year], 71 y.o.   MRN: [id number]  HPI 71 y/o WF here for a follow up visit... she has ASHD w/ prev CABG and chr AFib on coumadin followed by DrBerry... she also has FM/ chr pain syndrome followed by [name]... we have followed her primarily for COPD/ Asthmatic Bronchitis- see below...  ~ November 12, 2009:  she had knee surg 1/11 by [name]... then fell (tripped at home) 09/12/09 & hosp x4d> Xrays showed T4 compression... had f/u eval DrTooke w/ L2 compression, DDD, DJD> had epid steroid shots w/ coordination thru [facility] coumadin clinic, but developed large ecchymosis right post lumbar area... protime shows PT 67/ INR 6.6 & coumadin on hold per [facility]... we discussed the need for BMD testing & treatment> she will pursue this thru [facility] (prev DrRowe, now [employer]). Her breathing has been good since we added the SYMBICORT160> no exac, no need for antibiotics or prednisone...  ~  March 12, 2010:  she was hosp 6/11 by Marland Mcalpine for CP> no change in cath from 2009 w/ patent grafts, & meds adjusted w/ IMDUR started and she is improved... she is c/o bladder issues (?OB symptoms + urge incontinence etc)> we discussed trial of TOVIAZ 4mg  to start and appt w/ Urology for futher eval... mult other somatic complaints as well but breathing is stable;  unfortunately not able to lose signif weight despite trials of diets etc...   ~  July 02, 2010:  62mo ROV c/o incr congestion, cough w/ green mucus, & wants Avelox... similar exac 9/11 treated by TP w/ Avelox, Pred, Mucinex & resolved... she states the Toviaz helped & she saw DrPeterson for Urology who tried something else but she likes the Toviaz better & requests refill...  ~  December 29, 2010:  35mo ROV & c/o recurrent URI symptoms w/ cough, incr SOB, sore left lat ribs, green sputum, congestion, etc; treated w/ Avelox x2, then Cipro & no better she says; we discussed how the color comes from the  cellular elements in the sput & not germs per se; she is also c/o tired all the time, fatigue, sick all the time, etc; we decided to wait for fever, ch in sputum & then culture it...    She saw DrBerry for [facility] 1/12> stable from the Cards standpoint & no changes made... Unfortunately her wt is up to 271# & she reminded me that she had Bariatric surg yrs ago ([place] in the 70's) & lost >100# but grad put it all back on... We checked her labs & everything is about the same x B12 is borderline at 279 & we will rec OTC Vit B12 supplement po qd...  ~  July 28, 2011:  6mo ROV & she has persistent AB symptoms despite Augmentin & Pred Rx called in> she knows that she must improve diet, exercise & get wt down... Also c/o aching all over, soreness, tender, HAs, etc c/w her FM & mult somatic complaints...  She saw DrBerry 10/12 for f/u CAD, s/p CABG 1997, AFib on Coumadin & rate control, Hyperlipidemia> she had false pos Myoview in 2009 & subseq cath that showed patent grafts & norm LVF; he felt she was stable & rec same meds, f/u 44yr... See prob list>>            Problem List:  COPD (ICD-496) - ex smoker, quit 66yrs... uses nebulizer w/ ALBUTEROL Qid (using 1-2 daily) vs Proventil HFA, SYMBICORT  160- 2spBid & MUCINEX 1-2 Bid w/ fluids... baseline CXR shows prev CABG surg, borderline Cor, NAD... prev PFT's 12/05 showed more restriction (from effort and obesity) than obstruction... ~  CXR 10/09 in hosp showed biapical pleural thickening, NAD. ~  5/10:  bronchitic exas treated w/ Levaquin/ Medrol/ etc... 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 ~  12/10:  another exac requiring Levaquin/ Pred... we will add SYMBICORT160- 2sp Bid. ~  Improved w/ addition of Symbicort, but still c/o occas exac requiring antibiotics etc... ~  CXR 6/11 showed mild cardiomeg, s/p CABG, calcif Ao, clear/ NAD.Marland Kitchen. ~  CXR 1/13 showed borderline heart size, s/p CABG, lungs clear w/ min peribronch thickening, DJD spine,  osteopenia...  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 s/p CABG in 1997...  on IMDUR 30mg /d, METOPROLOL 50mg Bid, LASIX 40mg Bid, and K20Bid... ~  NuclearStressTests in 8/05 showing infer scar and mild inferolat ischemia...  ~  2DEcho 10/07 showed EF=45-50% and DD...  ~  repeat Myoview in 9/08 showed no ischemia... ~  hosp 10/09 w/ CP- cath showed 90%proxLAD, normCIRC, subtotal midRCA, LIMA & 2 vein grafts patent;  LVF= 60% w/o regional wall motion abn... no change from 2004. ~  repeat cath 6/11 showed no change from 2009 and patent grafts...  ATRIAL FIBRILLATION (ICD-427.31) - on COUMADIN per DrBerry... ~  4/11: complic after epidural shot w/ large ecchymosis right lumbar area, PT INR was >6, managed by [facility].  VENOUS INSUFFICIENCY (ICD-459.81) - she follows low sodium diet. elevates legs, wears support hose occas... she is on LASIX 40mg Bid per Cards... ~  9/12: [name] ordered VenDopplers- neg for DVT...  HYPERLIPIDEMIA (ICD-272.4) - on ZOCOR 20mg /d... ~  FLP 2/09 showed TChol 112, TG 76, HDL 43, LDL 54 ~  FLP 3/10 showed TChol 166, TG 137, HDL 40, LDL 98 ~  FLP 10/10 showed TChol 120, Tg 91, HDL 47, LDL 55 ~  12/11 & 0/86: not fasting for today's OV's... ~  4/12: note from [facility] showed TChol 99, TG 68, HDL 43, LDL 42 on Simva20. ~  FLP 5/78 on Simva20 showed TChol 110, TG 98, HDL 47, LDL 43  MORBID OBESITY (ICD-278.01) - we have discussed diet and exercise program on numerous occas in the past and again today! ~  weight 2/10 = 250# ~  weight 7/10 = 262# ~  weight 8/10 = 245# ~  weight 12/10 = 266# "it's a fighting battle" ~  weight 4/11 = 262# ~  weight 8/11 = 256# ~  weight 12/11 = 263#... we reviewed diet + exercise thereapy. ~  Weight 6/12 = 271# ~  Weight 1/13 = 266#  GERD (ICD-530.81) - on OMEPRAZOLE 20mg Bid... ?[facility] changed to Aciphex?  IBS (ICD-564.1) - last colonoscopy was 9/07 by Dorris Singh and showed hems only... prev studies revealed  melanosis and ischemic colitis...  RENAL INSUFFICIENCY (ICD-588.9) - Creat in the 1.6-1.7 range... ~  labs 2/09 showed BUN= 16, Creat= 1.6 ~  labs 3/10 showed BUN= 20, Creat= 1.7 ~  labs 4/11 showed BUN= 15, Creat= 1.6 ~  labs in hosp 6/11 showed BUN= 9, Creat= 1.3 ~  Labs here 6/12 showed BUN= 21, Creat= 1.6 ~  Labs 1/13 on Lasix40Bid showed BUN=15, Creat= 1.7  OTHER SYMPTOMS INVOLVING URINARY SYSTEM (ICD-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 she responded to TOVIAZ 8mg  for overact bladder symptoms & requests refill...  LOW BACK PAIN, CHRONIC (ICD-724.2) - eval and treated by Tamala Julian,  now DrBeekman> "they do it all now" w/ rechecks Q3-72months... she takes OXYCODONE 5/325, NEURONTIN 600mg Bid, etc... COMPRESSION FRACTURE (ICD-829.0) - found to have compression T12 & L2 4/11 treated by NS- DrTooke... CHRONIC PAIN SYNDROME (ICD-338.4) - managed by Eligha Bridegroom... 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 ~  labs 2/09 showed AlkPhos= 79 ~  labs 3/10 showed AlkPhos= 86 ~  labs 4/11 showed AlkPhos= 70 ~  labs 6/11 in hosp showed AlkPhos = 71 ~  Bone Scan 6/11 showed stable Paget's distal left tibia, compress fx L2, OA of both knees.  ANXIETY - on ALPRAZOLAM 0.5mg Qid...  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 Appendectomy   . Cholecystectomy   . Vesicovaginal fistula closure w/ tah   . Coronary artery bypass graft     Outpatient Encounter Prescriptions as of 07/28/2011  Medication Sig Dispense Refill  . albuterol (PROVENTIL) (2.5 MG/3ML) 0.083% nebulizer solution USE IN NEB TREATMENT UP TO FOUR TIMES A DAY AS NEEDED FOR WHEEZING...  150 mL  4  . ALPRAZolam (XANAX) 0.5 MG tablet Take 1 tablet (0.5 mg total) by mouth 4 (four) times daily as needed for anxiety.  120 tablet  1  .  budesonide-formoterol (SYMBICORT) 160-4.5 MCG/ACT inhaler Inhale 2 puffs into the lungs 2 (two) times daily.        . cyclobenzaprine (FLEXERIL) 10 MG tablet Take 10 mg by mouth 3 (three) times daily as needed.        . furosemide (LASIX) 40 MG tablet TAKE 1 TABLET TWICE DAILY  60 tablet  11  . gabapentin (NEURONTIN) 600 MG tablet TAKE 1 TABLET BY MOUTH 4 TIMES A DAY AS DIRECTED  120 tablet  0  . isosorbide mononitrate (IMDUR) 30 MG 24 hr tablet Take 30 mg by mouth daily.        Marland Kitchen KLOR-CON M20 20 MEQ tablet TAKE 1 TABLET TWICE DAILY  60 tablet  5  . metoprolol (LOPRESSOR) 50 MG tablet TAKE 1 TABLET TWICE DAILY  60 tablet  5  . oxyCODONE-acetaminophen (PERCOCET) 5-325 MG per tablet Take 1 tablet by mouth every 4 (four) hours as needed.        . promethazine (PHENERGAN) 25 MG tablet Take 1 tablet (25 mg total) by mouth every 6 (six) hours as needed.  30 tablet  5  . Pseudoephedrine-Guaifenesin (MUCINEX D) [phone] MG TB12 Take 1 tablet by mouth 2 (two) times daily.        . simvastatin (ZOCOR) 20 MG tablet Take 20 mg by mouth at bedtime.        . vitamin B-12 (CYANOCOBALAMIN) 1000 MCG tablet Take 1,000 mcg by mouth daily.        Marland Kitchen warfarin (COUMADIN) 5 MG tablet Take 5 mg by mouth daily. As directed by Dr. Allyson Sabal         Allergies  Allergen Reactions  . Metolazone     REACTION: pt states very sens to Zaroxyln    Current Medications, Allergies, Past Medical History, Past Surgical History, Family History, and Social History were reviewed in [employer] record.    Review of Systems         See HPI - all other systems neg except as noted... The patient complains of decreased hearing, dyspnea on exertion, headaches, incontinence, and depression.  The patient denies anorexia, fever, weight loss, weight gain, vision loss, hoarseness,  chest pain, syncope, peripheral edema, prolonged cough, hemoptysis, abdominal pain, melena, hematochezia, severe indigestion/heartburn,  hematuria, muscle weakness, suspicious skin lesions, transient blindness, difficulty walking, unusual weight change, abnormal bleeding, enlarged lymph nodes, and angioedema.    Objective:   Physical Exam     WD, Obese, 71 y/o WF in NAD... GENERAL:  Alert & oriented; pleasant & cooperative... HEENT:  [place]/AT, EOM- full, PERRLA, EACs-clear, TMs-wnl, NOSE-clear, THROAT-clear & wnl. NECK:  Supple w/ fairROM; no JVD; normal carotid impulses w/o bruits; no thyromegaly or nodules palpated; no lymphadenopathy. CHEST:  Coarse BS w/ no wheezing, no rales, no signs of consolidation... HEART:  Regular Rhythm; without murmurs/ rubs/ or gallops heard... ABDOMEN:  Obese, soft & nontender; normal bowel sounds; no organomegaly or masses detected. EXT: without deformities, mild arthritic changes; no varicose veins/ +venous insuffic/ 1+ edema. NEURO:  no focal neuro deficits... DERM:  along right lateral chest wall, under breast & over the costal margin- scarring in skin from shingles rash...  RADIOLOGY DATA:  Reviewed in the EPIC EMR & discussed w/ the patient...  LABORATORY DATA:  Reviewed in the EPIC EMR & discussed w/ the patient...   Assessment & Plan:   COPD>  Stable overall despite her chronic symptoms> continue NEBS, Symbicort, Mucinex, Fluids;  I told her no more antibiotics w/o approp culture data...  ASHD/ CAD/ s/p CABG>  Followed by Marland Mcalpine [facility]; continue same meds...  AFIB>  On Coumadin per [facility], continue same Rx...  HYPERLIPIDEMIA>  FLP looks great, continue Simva20...  OBESITY>  We reviewed diet & exercise program; recall remote hx of bariatric surg...  Renal Insuffic>  Creat= 1.7 on her current regimen; advised to drink plenty of water & maintain hydration...  ORTHO>  LBP/ Compression fx/ FM/ Paget's/ Chr Pain Syndrome> managed by [name] at [facility] he took over for [facility]...  Anxiety>  On Alpraz prn...   Patient's Medications  New Prescriptions   No medications on file    Previous Medications   ALBUTEROL (PROVENTIL) (2.5 MG/3ML) 0.083% NEBULIZER SOLUTION    USE IN NEB TREATMENT UP TO FOUR TIMES A DAY AS NEEDED FOR WHEEZING...   ALPRAZOLAM (XANAX) 0.5 MG TABLET    Take 1 tablet (0.5 mg total) by mouth 4 (four) times daily as needed for anxiety.   BUDESONIDE-FORMOTEROL (SYMBICORT) 160-4.5 MCG/ACT INHALER    Inhale 2 puffs into the lungs 2 (two) times daily.     CYCLOBENZAPRINE (FLEXERIL) 10 MG TABLET    Take 10 mg by mouth 3 (three) times daily as needed.     FUROSEMIDE (LASIX) 40 MG TABLET    TAKE 1 TABLET TWICE DAILY   GABAPENTIN (NEURONTIN) 600 MG TABLET    TAKE 1 TABLET BY MOUTH 4 TIMES A DAY AS DIRECTED   ISOSORBIDE MONONITRATE (IMDUR) 30 MG 24 HR TABLET    Take 30 mg by mouth daily.     KLOR-CON M20 20 MEQ TABLET    TAKE 1 TABLET TWICE DAILY   METOPROLOL (LOPRESSOR) 50 MG TABLET    TAKE 1 TABLET TWICE DAILY   OXYCODONE-ACETAMINOPHEN (PERCOCET) 5-325 MG PER TABLET    Take 1 tablet by mouth every 4 (four) hours as needed.     PROMETHAZINE (PHENERGAN) 25 MG TABLET    Take 1 tablet (25 mg total) by mouth every 6 (six) hours as needed.   PSEUDOEPHEDRINE-GUAIFENESIN (MUCINEX D) [phone] MG TB12    Take 1 tablet by mouth 2 (two) times daily.     SIMVASTATIN (ZOCOR) 20 MG TABLET  Take 20 mg by mouth at bedtime.     VITAMIN B-12 (CYANOCOBALAMIN) 1000 MCG TABLET    Take 1,000 mcg by mouth daily.     WARFARIN (COUMADIN) 5 MG TABLET    Take 5 mg by mouth daily. As directed by Dr. Allyson Sabal   Modified Medications   Modified Medication Previous Medication   AMITRIPTYLINE (ELAVIL) 50 MG TABLET amitriptyline (ELAVIL) 50 MG tablet      TAKE 2 TABLETS BY MOUTH AT BEDTIME    TAKE 2 TABLETS BY MOUTH AT BEDTIME   OMEPRAZOLE (PRILOSEC) 20 MG CAPSULE omeprazole (PRILOSEC) 20 MG capsule      TAKE 1 CAPSULE TWICE DAILY    TAKE 1 CAPSULE TWICE DAILY  Discontinued Medications   AMOXICILLIN-CLAVULANATE (AUGMENTIN) 875-125 MG PER TABLET    1 tablet twice a day x 5 days   FESOTERODINE  FUMARATE (TOVIAZ) 8 MG TB24    Take 1 tablet by mouth daily as needed.     PREDNISONE (DELTASONE) 10 MG TABLET    Take 4 tablets each am x 2 days, 2 tablets each am x 2 days, 1 each am x 2 days then stop

## 2011-08-11 ENCOUNTER — Other Ambulatory Visit: Payer: Self-pay | Admitting: Pulmonary Disease

## 2011-08-25 ENCOUNTER — Ambulatory Visit (INDEPENDENT_AMBULATORY_CARE_PROVIDER_SITE_OTHER): Payer: Medicare Other | Admitting: Adult Health

## 2011-08-25 ENCOUNTER — Other Ambulatory Visit: Payer: Self-pay

## 2011-08-25 ENCOUNTER — Emergency Department (HOSPITAL_COMMUNITY)
Admission: EM | Admit: 2011-08-25 | Discharge: 2011-08-25 | Disposition: A | Discharge status: Home / Self Care | Payer: Medicare Other | Attending: Emergency Medicine | Admitting: Emergency Medicine

## 2011-08-25 ENCOUNTER — Emergency Department (HOSPITAL_COMMUNITY): Payer: Medicare Other

## 2011-08-25 ENCOUNTER — Encounter (HOSPITAL_COMMUNITY): Payer: Self-pay

## 2011-08-25 ENCOUNTER — Encounter: Payer: Self-pay | Admitting: Adult Health

## 2011-08-25 VITALS — BP 122/78 | HR 140 | Temp 97.9°F | Ht 66.5 in | Wt 260.8 lb

## 2011-08-25 DIAGNOSIS — J40 Bronchitis, not specified as acute or chronic: Secondary | ICD-10-CM

## 2011-08-25 DIAGNOSIS — R0989 Other specified symptoms and signs involving the circulatory and respiratory systems: Secondary | ICD-10-CM | POA: Insufficient documentation

## 2011-08-25 DIAGNOSIS — R002 Palpitations: Secondary | ICD-10-CM | POA: Insufficient documentation

## 2011-08-25 DIAGNOSIS — I4891 Unspecified atrial fibrillation: Secondary | ICD-10-CM | POA: Insufficient documentation

## 2011-08-25 DIAGNOSIS — K219 Gastro-esophageal reflux disease without esophagitis: Secondary | ICD-10-CM | POA: Insufficient documentation

## 2011-08-25 DIAGNOSIS — J449 Chronic obstructive pulmonary disease, unspecified: Secondary | ICD-10-CM | POA: Insufficient documentation

## 2011-08-25 DIAGNOSIS — Z7901 Long term (current) use of anticoagulants: Secondary | ICD-10-CM | POA: Insufficient documentation

## 2011-08-25 DIAGNOSIS — J4489 Other specified chronic obstructive pulmonary disease: Secondary | ICD-10-CM | POA: Insufficient documentation

## 2011-08-25 DIAGNOSIS — H9209 Otalgia, unspecified ear: Secondary | ICD-10-CM | POA: Insufficient documentation

## 2011-08-25 DIAGNOSIS — G894 Chronic pain syndrome: Secondary | ICD-10-CM | POA: Insufficient documentation

## 2011-08-25 DIAGNOSIS — I2510 Atherosclerotic heart disease of native coronary artery without angina pectoris: Secondary | ICD-10-CM | POA: Insufficient documentation

## 2011-08-25 DIAGNOSIS — E785 Hyperlipidemia, unspecified: Secondary | ICD-10-CM | POA: Insufficient documentation

## 2011-08-25 DIAGNOSIS — R05 Cough: Secondary | ICD-10-CM | POA: Insufficient documentation

## 2011-08-25 DIAGNOSIS — R059 Cough, unspecified: Secondary | ICD-10-CM | POA: Insufficient documentation

## 2011-08-25 DIAGNOSIS — Z951 Presence of aortocoronary bypass graft: Secondary | ICD-10-CM | POA: Insufficient documentation

## 2011-08-25 DIAGNOSIS — Z79899 Other long term (current) drug therapy: Secondary | ICD-10-CM | POA: Insufficient documentation

## 2011-08-25 LAB — DIFFERENTIAL
Eosinophils Absolute: 0.2 10*3/uL (ref 0.0–0.7)
Eosinophils Relative: 3 % (ref 0–5)
Lymphs Abs: 2.5 10*3/uL (ref 0.7–4.0)
Monocytes Absolute: 0.8 10*3/uL (ref 0.1–1.0)
Monocytes Relative: 10 % (ref 3–12)

## 2011-08-25 LAB — CBC
Hemoglobin: 12.3 g/dL (ref 12.0–15.0)
MCH: 30.5 pg (ref 26.0–34.0)
MCHC: 32.5 g/dL (ref 30.0–36.0)
MCV: 94 fL (ref 78.0–100.0)
RBC: 4.03 MIL/uL (ref 3.87–5.11)

## 2011-08-25 LAB — APTT: aPTT: 37 seconds (ref 24–37)

## 2011-08-25 LAB — BASIC METABOLIC PANEL
Chloride: 98 mEq/L (ref 96–112)
Creatinine, Ser: 1.79 mg/dL — ABNORMAL HIGH (ref 0.50–1.10)
GFR calc Af Amer: 32 mL/min — ABNORMAL LOW (ref >90)
Potassium: 3.9 mEq/L (ref 3.5–5.1)
Sodium: 137 mEq/L (ref 135–145)

## 2011-08-25 LAB — PROTIME-INR: Prothrombin Time: 21.6 seconds — ABNORMAL HIGH (ref 11.6–15.2)

## 2011-08-25 MED ORDER — METOPROLOL TARTRATE 1 MG/ML IV SOLN
5.0000 mg | Freq: Once | INTRAVENOUS | Status: AC
  Administered 2011-08-25: 5 mg via INTRAVENOUS
  Filled 2011-08-25: qty 5

## 2011-08-25 MED ORDER — ANTIPYRINE-BENZOCAINE 5.4-1.4 % OT SOLN
3.0000 [drp] | Freq: Once | OTIC | Status: AC
  Administered 2011-08-25: 3 [drp] via OTIC
  Filled 2011-08-25: qty 10

## 2011-08-25 MED ORDER — PREDNISONE 10 MG PO TABS: ORAL_TABLET | ORAL | Status: DC

## 2011-08-25 MED ORDER — AMOXICILLIN-POT CLAVULANATE 875-125 MG PO TABS: 1.0000 | ORAL_TABLET | Freq: Two times a day (BID) | ORAL | Status: AC

## 2011-08-25 MED ORDER — METOPROLOL TARTRATE 25 MG PO TABS
50.0000 mg | ORAL_TABLET | Freq: Once | ORAL | Status: AC
  Administered 2011-08-25: 50 mg via ORAL
  Filled 2011-08-25: qty 1

## 2011-08-25 NOTE — ED Provider Notes (Signed)
History     CSN: [id number]  Arrival date & time 08/25/11  1104   First MD Initiated Contact with Patient 08/25/11 1205      Chief Complaint  Patient presents with  . Palpitations    (Consider location/radiation/quality/duration/timing/severity/associated sxs/prior treatment) HPI  Pt presents to the ED from her PCP office complaints of cough and ear pain and was incidentally found to be in  a.fib with rvr. Pt has not taken her 50mg  Lopressor yet today and she is on coumadin. She is asymptomatic and it is not known how long she has been in A.Fib. She denies chest pain. Work up started in triage CBC, BMP, coags, chest xray. Pt is resting comfortably.   Past Medical History  Diagnosis Date  . Chronic airway obstruction, not elsewhere classified   . Coronary atherosclerosis of unspecified type of vessel, native or graft   . Atrial fibrillation   . Unspecified venous (peripheral) insufficiency   . Other and unspecified hyperlipidemia   . Morbid obesity   . Esophageal reflux   . Irritable bowel syndrome   . 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 Other symptoms involving urinary system   . Chronic pain syndrome   . 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 Anxiety state, unspecified   . Herpes zoster without mention of complication   . Herpes zoster with other nervous system complications   . Myalgia and myositis, unspecified   . Lumbago     Past Surgical History  Procedure Date  . Appendectomy   . Cholecystectomy   . Vesicovaginal fistula closure w/ tah   . Coronary artery bypass graft     Family History  Problem Relation Age of Onset  . Heart disease Mother   . Lung disease Father   . Kidney disease      History  Substance Use Topics  . Smoking status: Former Smoker    Quit date: 07/20/1984  . Smokeless tobacco: Never Used  . Alcohol Use: Yes     social    OB History    Grav Para Term Preterm Abortions TAB SAB Ect Mult  Living                  Review of Systems  All other systems reviewed and are negative.    Allergies  Metolazone  Home Medications   Current Outpatient Rx  Name Route Sig Dispense Refill  . ALBUTEROL SULFATE (2.5 MG/3ML) 0.083% IN NEBU  USE IN NEB TREATMENT UP TO FOUR TIMES A DAY AS NEEDED FOR WHEEZING... 150 mL 4  . ALPRAZOLAM 0.5 MG PO TABS Oral Take 1 tablet (0.5 mg total) by mouth 4 (four) times daily as needed for anxiety. 120 tablet 1  . AMITRIPTYLINE HCL 50 MG PO TABS      . BUDESONIDE-FORMOTEROL FUMARATE 160-4.5 MCG/ACT IN AERO Inhalation Inhale 2 puffs into the lungs 2 (two) times daily.      . CYCLOBENZAPRINE HCL 10 MG PO TABS Oral Take 10 mg by mouth 3 (three) times daily as needed.      . FUROSEMIDE 40 MG PO TABS  TAKE 1 TABLET TWICE DAILY 60 tablet 11  . GABAPENTIN 600 MG PO TABS  TAKE 1 TABLET BY MOUTH 4 TIMES A DAY AS DIRECTED 120 tablet 0  . ISOSORBIDE MONONITRATE ER 30 MG PO TB24 Oral Take 30 mg by mouth daily.      Marland Kitchen KLOR-CON M20 20 MEQ PO TBCR  TAKE 1 TABLET TWICE DAILY 60 tablet 5  . METOPROLOL TARTRATE 50 MG PO TABS  TAKE 1 TABLET TWICE DAILY 60 tablet 5  . OMEPRAZOLE 20 MG PO CPDR  TAKE 1 CAPSULE TWICE DAILY 60 capsule 0  . OXYCODONE-ACETAMINOPHEN 5-325 MG PO TABS Oral Take 1 tablet by mouth every 4 (four) hours as needed.      Marland Kitchen PROMETHAZINE HCL 25 MG PO TABS Oral Take 1 tablet (25 mg total) by mouth every 6 (six) hours as needed. 30 tablet 5  . PSEUDOEPHEDRINE-GUAIFENESIN ER [phone] MG PO TB12 Oral Take 1 tablet by mouth 2 (two) times daily.      Marland Kitchen SIMVASTATIN 20 MG PO TABS Oral Take 20 mg by mouth at bedtime.      Marland Kitchen VITAMIN B-12 1000 MCG PO TABS Oral Take 1,000 mcg by mouth daily.      . WARFARIN SODIUM 5 MG PO TABS Oral Take 5 mg by mouth See admin instructions. 5mg  Sunday, Tuesday, Thursday   2.5mg  all other days    . AMOXICILLIN-POT CLAVULANATE 875-125 MG PO TABS Oral Take 1 tablet by mouth 2 (two) times daily. 14 tablet 0  . PREDNISONE 10 MG PO TABS  4  tabs for 2 days, then 3 tabs for 2 days, 2 tabs for 2 days, then 1 tab for 2 days, then stop 20 tablet 0    BP 131/92  Pulse 97  Temp(Src) 98.7 F (37.1 C) (Oral)  Resp 18  Ht 5\' 6"  (1.676 m)  Wt 260 lb (117.935 kg)  BMI 41.97 kg/m2  SpO2 95%  Physical Exam  Constitutional: She appears well-developed and well-nourished.  HENT:  Head: Normocephalic and atraumatic.  Eyes: Conjunctivae are normal. Pupils are equal, round, and reactive to light.  Neck: Trachea normal, normal range of motion and full passive range of motion without pain. Neck supple.  Cardiovascular: Normal rate, regular rhythm and normal pulses.   Pulmonary/Chest: Effort normal. She has wheezes. Chest wall is not dull to percussion. She exhibits no tenderness, no crepitus, no edema, no deformity and no retraction.       Pt coughing during lung exam  Abdominal: Soft. Normal appearance.  Musculoskeletal: Normal range of motion.  Neurological: She is alert. She has normal strength.  Skin: Skin is warm, dry and intact.  Psychiatric: She has a normal mood and affect. Her speech is normal and behavior is normal. Judgment and thought content normal. Cognition and memory are normal.    ED Course  Procedures (including critical care time)  Labs Reviewed  CBC - Abnormal; Notable for the following:    RDW 18.2 (*)    Platelets 137 (*)    All other components within normal limits  BASIC METABOLIC PANEL - Abnormal; Notable for the following:    BUN 25 (*)    Creatinine, Ser 1.79 (*)    GFR calc non Af Amer 28 (*)    GFR calc Af Amer 32 (*)    All other components within normal limits  PROTIME-INR - Abnormal; Notable for the following:    Prothrombin Time 21.6 (*)    INR 1.84 (*)    All other components within normal limits  DIFFERENTIAL  APTT   Dg Chest Port 1 View  08/25/2011  *RADIOLOGY REPORT*  Clinical Data: Chest congestion, atrial fibrillation, bronchitis  PORTABLE CHEST - 1 VIEW  Comparison: 07/28/2011   Findings: Cardiomediastinal silhouette is stable.  Status post CABG again noted.  No acute infiltrate or pulmonary  edema.  Again noted central mild bronchitic changes.  IMPRESSION: No significant change.  Stable central mild bronchitic changes.  No acute infiltrate or pulmonary edema.  Original Report Authenticated By: Natasha Mead, M.D.     1. Atrial fibrillation with RVR       MDM    First EKG   Date: 08/25/2011  Rate: 136   Rhythm: atrial fibrillation with rvr  QRS Axis: normal  Intervals: normal  ST/T Wave abnormalities: nonspecific ST changes  Conduction Disutrbances:none  Narrative Interpretation:   Old EKG Reviewed: none available  REPEAT EKG after Lopressor   Date: 08/25/2011  Rate: 85  Rhythm: atrial fibrillation 73-97  QRS Axis: normal  Intervals: normal  ST/T Wave abnormalities: normal  Conduction Disutrbances:none  Narrative Interpretation:   Old EKG Reviewed: no longer in A fib with rvr   Pt evaluated by Dr. Hyman Hopes and myself. Pt is asymptomatic, is therapeutic on coumadin and has been in a.fib previously. I has spoken with Dr. Phillips Odor at [facility] to ensure patient has close follow-up. The patient would like to go home and is comfortable with the plan. Pt has been Rx Augmentin 875mg  BId for 7 days, prednisone taper for a week, mucinex DM BID as needed for cough and congestions by Dr. Kriste Basque and is to follow-up in two weeks or sooner as needed.           Dorthula Matas, PA 08/25/11 1343

## 2011-08-25 NOTE — Assessment & Plan Note (Signed)
Acute Bronchitis with assoicated rhinitis/otitis media  Plan  Augmentin 875mg  Twice daily  X 7 d  Prednisone taper over next week.  Fluids and rest  Mucinex DM Twice daily  As needed  Cough/congestion  Please contact office for sooner follow up if symptoms do not improve or worsen or seek emergency care

## 2011-08-25 NOTE — ED Notes (Signed)
Pt discharged home. Had no further questions. Vital signs stable. Encouraged to follow up with Dr. Allyson Sabal. Wheelchair to discharge.

## 2011-08-25 NOTE — Assessment & Plan Note (Signed)
Afib with RVR  Case discussed with dr. Kriste Basque . Due to rate will need further evaluation  Dr. Allyson Sabal office is unable to see today.  Will send to ER , however pt declined EMS transport She has agreed to go to ER by private vehicle.  Please contact office for sooner follow up if symptoms do not improve or worsen or seek emergency care

## 2011-08-25 NOTE — Progress Notes (Signed)
Subjective:    Patient ID: [name], female    DOB: 1941-03-09, 71 y.o.   MRN: [id number]  HPI 71 y/o WF -ASHD w/ prev CABG and chr AFib on coumadin followed by DrBerry... she also has FM/ chr pain syndrome followed by [name]... we have followed her primarily for COPD/ Asthmatic Bronchitis- see below...  ~ November 12, 2009:  she had knee surg 1/11 by [name]... then fell (tripped at home) 09/12/09 & hosp x4d> Xrays showed T4 compression... had f/u eval DrTooke w/ L2 compression, DDD, DJD> had epid steroid shots w/ coordination thru [facility] coumadin clinic, but developed large ecchymosis right post lumbar area... protime shows PT 67/ INR 6.6 & coumadin on hold per [facility]... we discussed the need for BMD testing & treatment> she will pursue this thru [facility] (prev DrRowe, now [employer]). Her breathing has been good since we added the SYMBICORT160> no exac, no need for antibiotics or prednisone...  ~  March 12, 2010:  she was hosp 6/11 by Marland Mcalpine for CP> no change in cath from 2009 w/ patent grafts, & meds adjusted w/ IMDUR started and she is improved... she is c/o bladder issues (?OB symptoms + urge incontinence etc)> we discussed trial of TOVIAZ 4mg  to start and appt w/ Urology for futher eval... mult other somatic complaints as well but breathing is stable;  unfortunately not able to lose signif weight despite trials of diets etc...   ~  July 02, 2010:  40mo ROV c/o incr congestion, cough w/ green mucus, & wants Avelox... similar exac 9/11 treated by TP w/ Avelox, Pred, Mucinex & resolved... she states the Toviaz helped & she saw DrPeterson for Urology who tried something else but she likes the Toviaz better & requests refill...  ~  December 29, 2010:  12mo ROV & c/o recurrent URI symptoms w/ cough, incr SOB, sore left lat ribs, green sputum, congestion, etc; treated w/ Avelox x2, then Cipro & no better she says; we discussed how the color comes from the cellular elements in the sput & not  germs per se; she is also c/o tired all the time, fatigue, sick all the time, etc; we decided to wait for fever, ch in sputum & then culture it...    She saw DrBerry for [facility] 1/12> stable from the Cards standpoint & no changes made... Unfortunately her wt is up to 271# & she reminded me that she had Bariatric surg [place] ([place] the 70's) & lost >100# but grad put it all back on... We checked her labs & everything is about the same x B12 is borderline at 279 & we will rec OTC Vit B12 supplement po qd...  ~  July 28, 2011:  27mo ROV & she has persistent AB symptoms despite Augmentin & Pred Rx called in> she knows that she must improve diet, exercise & get wt down... Also c/o aching all over, soreness, tender, HAs, etc c/w her FM & mult somatic complaints...  She saw DrBerry 10/12 for f/u CAD, s/p CABG 1997, AFib on Coumadin & rate control, Hyperlipidemia> she had false pos Myoview in 2009 & subseq cath that showed patent grafts & norm LVF; he felt she was stable & rec same meds, f/u 16yr... See prob list>>   08/25/2011 Acute OV  Complains of right ear discomfort, prod cough with green mucus, wheezing, increased SOB x5days.  Right ear has been aching . Cough is getting worse with thick mucus. Has been using otc cold meds without  much help. Using albuterol more frequently. Took albuterol neb prior to viist this am . On arrival to office noted to be in A-Fib. EKG shows Atrial Fib with RVR - HR at 145.  No chest pain or increased edema. No syncope or headache. Good appetite. No n/v/d.  Discussed case with Dr. Kriste Basque  , pt to see cardiology . Dr. Allyson Sabal office contacted w/ recs to refer to ER. As they are unable to see in office.  Last TSH was nml last month             Problem List:  COPD (ICD-496) - ex smoker, quit 93yrs... uses nebulizer w/ ALBUTEROL Qid (using 1-2 daily) vs Proventil HFA, SYMBICORT 160- 2spBid & MUCINEX 1-2 Bid w/ fluids... baseline CXR shows prev CABG surg, borderline Cor,  NAD... prev PFT's 12/05 showed more restriction (from effort and obesity) than obstruction... ~  CXR 10/09 in hosp showed biapical pleural thickening, NAD. ~  5/10:  bronchitic exas treated w/ Levaquin/ Medrol/ etc... 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 ~  12/10:  another exac requiring Levaquin/ Pred... we will add SYMBICORT160- 2sp Bid. ~  Improved w/ addition of Symbicort, but still c/o occas exac requiring antibiotics etc... ~  CXR 6/11 showed mild cardiomeg, s/p CABG, calcif Ao, clear/ NAD.Marland Kitchen. ~  CXR 1/13 showed borderline heart size, s/p CABG, lungs clear w/ min peribronch thickening, DJD spine, osteopenia...  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 s/p CABG in 1997...  on IMDUR 30mg /d, METOPROLOL 50mg Bid, LASIX 40mg Bid, and K20Bid... ~  NuclearStressTests in 8/05 showing infer scar and mild inferolat ischemia...  ~  2DEcho 10/07 showed EF=45-50% and DD...  ~  repeat Myoview in 9/08 showed no ischemia... ~  hosp 10/09 w/ CP- cath showed 90%proxLAD, normCIRC, subtotal midRCA, LIMA & 2 vein grafts patent;  LVF= 60% w/o regional wall motion abn... no change from 2004. ~  repeat cath 6/11 showed no change from 2009 and patent grafts...  ATRIAL FIBRILLATION (ICD-427.31) - on COUMADIN per DrBerry... ~  4/11: complic after epidural shot w/ large ecchymosis right lumbar area, PT INR was >6, managed by [facility].  VENOUS INSUFFICIENCY (ICD-459.81) - she follows low sodium diet. elevates legs, wears support hose occas... she is on LASIX 40mg Bid per Cards... ~  9/12: [name] ordered VenDopplers- neg for DVT...  HYPERLIPIDEMIA (ICD-272.4) - on ZOCOR 20mg /d... ~  FLP 2/09 showed TChol 112, TG 76, HDL 43, LDL 54 ~  FLP 3/10 showed TChol 166, TG 137, HDL 40, LDL 98 ~  FLP 10/10 showed TChol 120, Tg 91, HDL 47, LDL 55 ~  12/11 & 1/61: not fasting for today's OV's... ~  4/12: [facility] from [facility] showed TChol 99, TG 68, HDL 43, LDL 42 on Simva20. ~  FLP 0/96 on Simva20  showed TChol 110, TG 98, HDL 47, LDL 43  MORBID OBESITY (ICD-278.01) - we have discussed diet and exercise program on numerous occas in the past and again today! ~  weight 2/10 = 250# ~  weight 7/10 = 262# ~  weight 8/10 = 245# ~  weight 12/10 = 266# "it's a fighting battle" ~  weight 4/11 = 262# ~  weight 8/11 = 256# ~  weight 12/11 = 263#... we reviewed diet + exercise thereapy. ~  Weight 6/12 = 271# ~  Weight 1/13 = 266#  GERD (ICD-530.81) - on OMEPRAZOLE 20mg Bid... ?[facility] changed to Aciphex?  IBS (ICD-564.1) - last colonoscopy was 9/07 by Dorris Singh and showed  hems only... 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  RENAL INSUFFICIENCY (ICD-588.9) - Creat in the 1.6-1.7 range... ~  labs 2/09 showed BUN= 16, Creat= 1.6 ~  labs 3/10 showed BUN= 20, Creat= 1.7 ~  labs 4/11 showed BUN= 15, Creat= 1.6 ~  labs in hosp 6/11 showed BUN= 9, Creat= 1.3 ~  Labs here 6/12 showed BUN= 21, Creat= 1.6 ~  Labs 1/13 on Lasix40Bid showed BUN=15, Creat= 1.7  OTHER SYMPTOMS INVOLVING URINARY SYSTEM (ICD-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 she responded to TOVIAZ 8mg  for overact bladder symptoms & requests refill...  LOW BACK PAIN, CHRONIC (ICD-724.2) - eval and treated by Tamala Julian, now DrBeekman> "they do it all now" w/ rechecks Q3-45months... she takes OXYCODONE 5/325, NEURONTIN 600mg Bid, etc... COMPRESSION FRACTURE (ICD-829.0) - found to have compression T12 & L2 4/11 treated by NS- DrTooke... CHRONIC PAIN SYNDROME (ICD-338.4) - managed by Eligha Bridegroom... 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 ~  labs 2/09 showed AlkPhos= 79 ~  labs 3/10 showed AlkPhos= 86 ~  labs 4/11 showed AlkPhos= 70 ~  labs 6/11 in hosp showed AlkPhos = 71 ~  Bone Scan 6/11 showed stable Paget's distal left tibia, compress fx L2, OA of both knees.  ANXIETY - on ALPRAZOLAM  0.5mg Qid...  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 Appendectomy   . Cholecystectomy   . Vesicovaginal fistula closure w/ tah   . Coronary artery bypass graft     Outpatient Encounter Prescriptions as of 07/28/2011  Medication Sig Dispense Refill  . albuterol (PROVENTIL) (2.5 MG/3ML) 0.083% nebulizer solution USE IN NEB TREATMENT UP TO FOUR TIMES A DAY AS NEEDED FOR WHEEZING...  150 mL  4  . ALPRAZolam (XANAX) 0.5 MG tablet Take 1 tablet (0.5 mg total) by mouth 4 (four) times daily as needed for anxiety.  120 tablet  1  . budesonide-formoterol (SYMBICORT) 160-4.5 MCG/ACT inhaler Inhale 2 puffs into the lungs 2 (two) times daily.        . cyclobenzaprine (FLEXERIL) 10 MG tablet Take 10 mg by mouth 3 (three) times daily as needed.        . furosemide (LASIX) 40 MG tablet TAKE 1 TABLET TWICE DAILY  60 tablet  11  . gabapentin (NEURONTIN) 600 MG tablet TAKE 1 TABLET BY MOUTH 4 TIMES A DAY AS DIRECTED  120 tablet  0  . isosorbide mononitrate (IMDUR) 30 MG 24 hr tablet Take 30 mg by mouth daily.        Marland Kitchen KLOR-CON M20 20 MEQ tablet TAKE 1 TABLET TWICE DAILY  60 tablet  5  . metoprolol (LOPRESSOR) 50 MG tablet TAKE 1 TABLET TWICE DAILY  60 tablet  5  . oxyCODONE-acetaminophen (PERCOCET) 5-325 MG per tablet Take 1 tablet by mouth every 4 (four) hours as needed.        . promethazine (PHENERGAN) 25 MG tablet Take 1 tablet (25 mg total) by mouth every 6 (six) hours as needed.  30 tablet  5  . Pseudoephedrine-Guaifenesin (MUCINEX D) [phone] MG TB12 Take 1 tablet by mouth 2 (two) times daily.        . simvastatin (ZOCOR) 20 MG tablet Take 20 mg by mouth at bedtime.        . vitamin B-12 (CYANOCOBALAMIN) 1000 MCG tablet Take 1,000 mcg by mouth daily.        Marland Kitchen  warfarin (COUMADIN) 5 MG tablet Take 5 mg by mouth daily. As directed by Dr. Allyson Sabal         Allergies  Allergen Reactions  . Metolazone     REACTION: pt states very  sens to Zaroxyln    Current Medications, Allergies, Past Medical History, Past Surgical History, Family History, and Social History were reviewed in [employer] record.    Review of Systems         Constitutional:   No  weight loss, night sweats,  Fevers, chills, + fatigue, or  lassitude.  HEENT:   No headaches,  Difficulty swallowing,  Tooth/dental problems, or  Sore throat,                No sneezing, itching,  +ear ache, nasal congestion, post nasal drip,   CV:  No chest pain,  Orthopnea, PND, swelling in lower extremities, anasarca, dizziness, palpitations, syncope.   GI  No heartburn, indigestion, abdominal pain, nausea, vomiting, diarrhea, change in bowel habits, loss of appetite, bloody stools.   Resp:   No coughing up of blood.     No chest wall deformity  Skin: no rash or lesions.  GU: no dysuria, change in color of urine, no urgency or frequency.  No flank pain, no hematuria   MS:  No joint pain or swelling.  No decreased range of motion.  No back pain.  Psych:  No change in mood or affect. No depression or anxiety.  No memory loss.       Objective:   Physical Exam     WD, Obese, 71 y/o WF in NAD... GENERAL:  Alert & oriented; pleasant & cooperative... HEENT:  [place]/AT,   EACs-right swollen/red TMs nml on left, difficult to visualize on right  NOSE-clear, THROAT-clear & wnl. NECK:  Supple w/ fairROM; no JVD; normal carotid impulses w/o bruits; no thyromegaly or nodules palpated; +ant cervical  lymphadenopathy. CHEST:  Coarse BS w/ exp wheeze  HEART:  Irregular , without murmurs/ rubs/ or gallops heard... ABDOMEN:  Obese, soft & nontender; normal bowel sounds; no organomegaly or masses detected. EXT: without deformities, mild arthritic changes; no varicose veins/ +venous insuffic/ tr + edema. NEURO:  no focal neuro deficits... DERM: no rash     Assessment & Plan:

## 2011-08-25 NOTE — ED Notes (Signed)
Pt resting quietly at the time. Denies chest pain. afib on cardiac monitor. States 3/10 pain to R ear. No signs of distress noted. She remains alert and oriented x4. Husband at bedside.

## 2011-08-25 NOTE — ED Notes (Signed)
Pt presents with multiple complaints but reports she was referred here after she went to PCP for ear ache and cough.  Pt was found to be in afib w/RVR at office with HR in 150s.  Pt reports 5 day h/o L ear pain, productive cough with green cough, knee pain and generalized weakness.  Pt reports intermittent chest pain, denies any at present.

## 2011-08-25 NOTE — ED Provider Notes (Signed)
BP 147/81  Pulse 141  Temp(Src) 98.9 F (37.2 C) (Oral)  Resp 24  Ht 5\' 6"  (1.676 m)  Wt 260 lb (117.935 kg)  BMI 41.97 kg/m2  Patient presenting to her doctor's office today do to upper respiratory symptoms and ear pain. She was found to be in A. fib RVR. Prior history of a the and states she has not taken her 50 mg Lopressor today. She is unclear how long she has been in A. fib RVR she has not noticed any palpitations. She denies any chest pain but states she's felt chest congestion do to bronchitis. CBC, BMP, coags, chest x-ray pending. Patient given IV Lopressor and home dose of 50 mg Lopressor.   Date: 08/25/2011  Rate: 136  Rhythm: atrial fibrillation RVR  QRS Axis: normal  Intervals: normal  ST/T Wave abnormalities: nonspecific ST changes  Conduction Disutrbances:none  Narrative Interpretation:   Old EKG Reviewed: unchanged    Gwyneth Sprout, MD 08/25/11 1129

## 2011-08-25 NOTE — Patient Instructions (Addendum)
We recommend you go to the hospital /emergency room to have your Atrial Fib evaluated We recommend you go by EMS however you have declined -please go straight to the ER at [facility]  If you are not admitted, can take Augmentin 875mg  Twice daily  For 7 days  Prednisone taper over next week Mucinex DM Twice daily  As needed  Cough/congestion  follow up Dr. Kriste Basque  In 2 weeks and As needed   Please contact office for sooner follow up if symptoms do not improve or worsen or seek emergency care

## 2011-08-25 NOTE — ED Notes (Addendum)
Pt presents to department for evaluation of afib. Pt states she went to see PCP today for productive cough and L sided ear pain. Was sent here due to elevated HR 150bpm. Upon arrival to exam room pt denies chest pain. afib on monitor. Respirations unlabored. Skin warm and dry. Now c/o R earache. Able to move all extremities without difficulty. Pt conscious alert and oriented x4.

## 2011-08-26 NOTE — ED Provider Notes (Signed)
Medical screening examination/treatment/procedure(s) were conducted as a shared visit with non-physician practitioner(s) and myself.  I personally evaluated the patient during the encounter  Presents from outpt clinic with she was found to be in afib with rvr. Pt denies palpitations, cp, sob. She states she did not take her metoprolol this morning because she was NPO prior to clinic. She was being evaluated for new productive cough and ear pain. Diagnosed with bronchitis and prescribed abx by her PMD. HR controlled here with 5mg  IV metoprolol and given her PO dose Metoprolol 50 mg here. Appears well. Rate controlled at this time. She is taking coumadin, INR 1.8 to f/u with cardiology/pmd for adjustment of coumadin dose. PA discussed with cardiology to ensure close f/u.  Forbes Cellar, MD 08/26/11 [phone]

## 2011-09-02 ENCOUNTER — Other Ambulatory Visit: Payer: Self-pay | Admitting: Pulmonary Disease

## 2011-09-16 ENCOUNTER — Other Ambulatory Visit: Payer: Self-pay | Admitting: Pulmonary Disease

## 2011-09-18 ENCOUNTER — Ambulatory Visit (INDEPENDENT_AMBULATORY_CARE_PROVIDER_SITE_OTHER): Payer: Medicare Other | Admitting: Adult Health

## 2011-09-18 ENCOUNTER — Encounter: Payer: Self-pay | Admitting: Adult Health

## 2011-09-18 VITALS — BP 152/60 | HR 100 | Temp 96.9°F | Ht 66.5 in | Wt 269.0 lb

## 2011-09-18 DIAGNOSIS — H6091 Unspecified otitis externa, right ear: Secondary | ICD-10-CM | POA: Insufficient documentation

## 2011-09-18 DIAGNOSIS — H60399 Other infective otitis externa, unspecified ear: Secondary | ICD-10-CM

## 2011-09-18 MED ORDER — NEOMYCIN-POLYMYXIN-HC 3.5-10000-1 OT SOLN: 3.0000 [drp] | Freq: Four times a day (QID) | OTIC | Status: AC

## 2011-09-18 NOTE — Patient Instructions (Addendum)
Cortisporin ear drops. Place 3 drops into the right ear 4 (four) times daily  No Q-tips, pins or anything in the ears Follow up with Dr. Kriste Basque in 3 months or as needed   Call of Please contact office for sooner follow up if symptoms do not improve or worsen or seek emergency care

## 2011-09-18 NOTE — Progress Notes (Signed)
Subjective:    Patient ID: [name], female    DOB: Jul 14, 1941, 71 y.o.   MRN: [id number]  HPI 71 y/o WF with hx of  ASHD w/ prev CABG and chr AFib on coumadin followed by DrBerry... she also has FM/ chr pain syndrome followed by [name]... we have followed her primarily for COPD/ Asthmatic Bronchitis- see below...  ~ November 12, 2009:  she had knee surg 1/11 by [name]... then fell (tripped at home) 09/12/09 & hosp x4d> Xrays showed T4 compression... had f/u eval DrTooke w/ L2 compression, DDD, DJD> had epid steroid shots w/ coordination thru [facility] coumadin clinic, but developed large ecchymosis right post lumbar area... protime shows PT 67/ INR 6.6 & coumadin on hold per [facility]... we discussed the need for BMD testing & treatment> she will pursue this thru [facility] (prev DrRowe, now [employer]). Her breathing has been good since we added the SYMBICORT160> no exac, no need for antibiotics or prednisone...  ~  March 12, 2010:  she was hosp 6/11 by Marland Mcalpine for CP> no change in cath from 2009 w/ patent grafts, & meds adjusted w/ IMDUR started and she is improved... she is c/o bladder issues (?OB symptoms + urge incontinence etc)> we discussed trial of TOVIAZ 4mg  to start and appt w/ Urology for futher eval... mult other somatic complaints as well but breathing is stable;  unfortunately not able to lose signif weight despite trials of diets etc...   ~  July 02, 2010:  392mo ROV c/o incr congestion, cough w/ green mucus, & wants Avelox... similar exac 9/11 treated by TP w/ Avelox, Pred, Mucinex & resolved... she states the Toviaz helped & she saw DrPeterson for Urology who tried something else but she likes the Toviaz better & requests refill...  ~  December 29, 2010:  92mo ROV & c/o recurrent URI symptoms w/ cough, incr SOB, sore left lat ribs, green sputum, congestion, etc; treated w/ Avelox x2, then Cipro & no better she says; we discussed how the color comes from the cellular elements in the  sput & not germs per se; she is also c/o tired all the time, fatigue, sick all the time, etc; we decided to wait for fever, ch in sputum & then culture it...    She saw DrBerry for [facility] 1/12> stable from the Cards standpoint & no changes made... Unfortunately her wt is up to 271# & she reminded me that she had Bariatric surg yrs ago ([place] in the 70's) & lost >100# but grad put it all back on... We checked her labs & everything is about the same x B12 is borderline at 279 & we will rec OTC Vit B12 supplement po qd...  ~  July 28, 2011:  29mo ROV & she has persistent AB symptoms despite Augmentin & Pred Rx called in> she knows that she must improve diet, exercise & get wt down... Also c/o aching all over, soreness, tender, HAs, etc c/w her FM & mult somatic complaints...  She saw DrBerry 10/12 for f/u CAD, s/p CABG 1997, AFib on Coumadin & rate control, Hyperlipidemia> she had false pos Myoview in 2009 & subseq cath that showed patent grafts & norm LVF; he felt she was stable & rec same meds, f/u 4yr... See prob list>>   09/18/2011 Acute OV  Complains of right ear pain x 3-4 weeks. Sore to touch. States that ear feels "stopped up" but no difficulty hearing. No mouth/teeth pain, no swallowing issues, no sore throat, no  fevers. Frequent use of q-tips in ears along with nail file as well.    To note, patient was here on 08/25/11 with complaints of the same. Dx bronchitis. Prescribed Augmentin and prednisone taper x 7 days. Was also found to be in A-Fib in office. EKG showed Atrial Fib with RVR, HR at 145. Patient was referred to ER for further evaluation and management. She was seen by cards with rx adjustment w/ rate control   No palpitations, cp or sob.           Problem List:  COPD (ICD-496) - ex smoker, quit 24yrs... uses nebulizer w/ ALBUTEROL Qid (using 1-2 daily) vs Proventil HFA, SYMBICORT 160- 2spBid & MUCINEX 1-2 Bid w/ fluids... baseline CXR shows prev CABG surg, borderline Cor, NAD... prev  PFT's 12/05 showed more restriction (from effort and obesity) than obstruction... ~  CXR 10/09 in hosp showed biapical pleural thickening, NAD. ~  5/10:  bronchitic exas treated w/ Levaquin/ Medrol/ etc... 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 ~  12/10:  another exac requiring Levaquin/ Pred... we will add SYMBICORT160- 2sp Bid. ~  Improved w/ addition of Symbicort, but still c/o occas exac requiring antibiotics etc... ~  CXR 6/11 showed mild cardiomeg, s/p CABG, calcif Ao, clear/ NAD.Marland Kitchen. ~  CXR 1/13 showed borderline heart size, s/p CABG, lungs clear w/ min peribronch thickening, DJD spine, osteopenia...  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 s/p CABG in 1997...  on IMDUR 30mg /d, METOPROLOL 50mg Bid, LASIX 40mg Bid, and K20Bid... ~  NuclearStressTests in 8/05 showing infer scar and mild inferolat ischemia...  ~  2DEcho 10/07 showed EF=45-50% and DD...  ~  repeat Myoview in 9/08 showed no ischemia... ~  hosp 10/09 w/ CP- cath showed 90%proxLAD, normCIRC, subtotal midRCA, LIMA & 2 vein grafts patent;  LVF= 60% w/o regional wall motion abn... no change from 2004. ~  repeat cath 6/11 showed no change from 2009 and patent grafts...  ATRIAL FIBRILLATION (ICD-427.31) - on COUMADIN per DrBerry... ~  4/11: complic after epidural shot w/ large ecchymosis right lumbar area, PT INR was >6, managed by [facility].  VENOUS INSUFFICIENCY (ICD-459.81) - she follows low sodium diet. elevates legs, wears support hose occas... she is on LASIX 40mg Bid per Cards... ~  9/12: [name] ordered VenDopplers- neg for DVT...  HYPERLIPIDEMIA (ICD-272.4) - on ZOCOR 20mg /d... ~  FLP 2/09 showed TChol 112, TG 76, HDL 43, LDL 54 ~  FLP 3/10 showed TChol 166, TG 137, HDL 40, LDL 98 ~  FLP 10/10 showed TChol 120, Tg 91, HDL 47, LDL 55 ~  12/11 & 1/61: not fasting for today's OV's... ~  4/12: note from [facility] showed TChol 99, TG 68, HDL 43, LDL 42 on Simva20. ~  FLP 0/96 on Simva20 showed TChol  110, TG 98, HDL 47, LDL 43  MORBID OBESITY (ICD-278.01) - we have discussed diet and exercise program on numerous occas in the past and again today! ~  weight 2/10 = 250# ~  weight 7/10 = 262# ~  weight 8/10 = 245# ~  weight 12/10 = 266# "it's a fighting battle" ~  weight 4/11 = 262# ~  weight 8/11 = 256# ~  weight 12/11 = 263#... we reviewed diet + exercise thereapy. ~  Weight 6/12 = 271# ~  Weight 1/13 = 266#  GERD (ICD-530.81) - on OMEPRAZOLE 20mg Bid... ?[facility] changed to Aciphex?  IBS (ICD-564.1) - last colonoscopy was 9/07 by Dorris Singh and showed hems only... prev studies revealed  melanosis and ischemic colitis...  RENAL INSUFFICIENCY (ICD-588.9) - Creat in the 1.6-1.7 range... ~  labs 2/09 showed BUN= 16, Creat= 1.6 ~  labs 3/10 showed BUN= 20, Creat= 1.7 ~  labs 4/11 showed BUN= 15, Creat= 1.6 ~  labs in hosp 6/11 showed BUN= 9, Creat= 1.3 ~  Labs here 6/12 showed BUN= 21, Creat= 1.6 ~  Labs 1/13 on Lasix40Bid showed BUN=15, Creat= 1.7  OTHER SYMPTOMS INVOLVING URINARY SYSTEM (ICD-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 she responded to TOVIAZ 8mg  for overact bladder symptoms & requests refill...  LOW BACK PAIN, CHRONIC (ICD-724.2) - eval and treated by Tamala Julian, now DrBeekman> "they do it all now" w/ rechecks Q3-72months... she takes OXYCODONE 5/325, NEURONTIN 600mg Bid, etc... COMPRESSION FRACTURE (ICD-829.0) - found to have compression T12 & L2 4/11 treated by NS- DrTooke... CHRONIC PAIN SYNDROME (ICD-338.4) - managed by Eligha Bridegroom... 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 ~  labs 2/09 showed AlkPhos= 79 ~  labs 3/10 showed AlkPhos= 86 ~  labs 4/11 showed AlkPhos= 70 ~  labs 6/11 in hosp showed AlkPhos = 71 ~  Bone Scan 6/11 showed stable Paget's distal left tibia, compress fx L2, OA of both knees.  ANXIETY - on ALPRAZOLAM  0.5mg Qid...  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 Appendectomy   . Cholecystectomy   . Vesicovaginal fistula closure w/ tah   . Coronary artery bypass graft     Outpatient Encounter Prescriptions as of 07/28/2011  Medication Sig Dispense Refill  . albuterol (PROVENTIL) (2.5 MG/3ML) 0.083% nebulizer solution USE IN NEB TREATMENT UP TO FOUR TIMES A DAY AS NEEDED FOR WHEEZING...  150 mL  4  . ALPRAZolam (XANAX) 0.5 MG tablet Take 1 tablet (0.5 mg total) by mouth 4 (four) times daily as needed for anxiety.  120 tablet  1  . budesonide-formoterol (SYMBICORT) 160-4.5 MCG/ACT inhaler Inhale 2 puffs into the lungs 2 (two) times daily.        . cyclobenzaprine (FLEXERIL) 10 MG tablet Take 10 mg by mouth 3 (three) times daily as needed.        . furosemide (LASIX) 40 MG tablet TAKE 1 TABLET TWICE DAILY  60 tablet  11  . gabapentin (NEURONTIN) 600 MG tablet TAKE 1 TABLET BY MOUTH 4 TIMES A DAY AS DIRECTED  120 tablet  0  . isosorbide mononitrate (IMDUR) 30 MG 24 hr tablet Take 30 mg by mouth daily.        Marland Kitchen KLOR-CON M20 20 MEQ tablet TAKE 1 TABLET TWICE DAILY  60 tablet  5  . metoprolol (LOPRESSOR) 50 MG tablet TAKE 1 TABLET TWICE DAILY  60 tablet  5  . oxyCODONE-acetaminophen (PERCOCET) 5-325 MG per tablet Take 1 tablet by mouth every 4 (four) hours as needed.        . promethazine (PHENERGAN) 25 MG tablet Take 1 tablet (25 mg total) by mouth every 6 (six) hours as needed.  30 tablet  5  . Pseudoephedrine-Guaifenesin (MUCINEX D) [phone] MG TB12 Take 1 tablet by mouth 2 (two) times daily.        . simvastatin (ZOCOR) 20 MG tablet Take 20 mg by mouth at bedtime.        . vitamin B-12 (CYANOCOBALAMIN) 1000 MCG tablet Take 1,000 mcg by mouth daily.        Marland Kitchen warfarin (  COUMADIN) 5 MG tablet Take 5 mg by mouth daily. As directed by Dr. Allyson Sabal         Allergies  Allergen Reactions  . Metolazone     REACTION: pt states very  sens to Zaroxyln    Current Medications, Allergies, Past Medical History, Past Surgical History, Family History, and Social History were reviewed in [employer] record.    Review of Systems         Constitutional:   No  weight loss, night sweats, fevers, chills, fatigue, or  lassitude.  HEENT:   No headaches,  difficulty swallowing,  tooth/dental problems, or  sore throat.  No sneezing, itching, nasal congestion or post nasal drip. + right ear pain. No difficulty hearing.   CV:  No chest pain,  orthopnea, PND,   anasarca, dizziness, palpitations, syncope.   GI  No heartburn, indigestion, abdominal pain, nausea, vomiting, diarrhea, change in bowel habits, loss of appetite, bloody stools.   Resp: No shortness of breath with exertion or at rest.  No excess mucus, no productive cough,  No non-productive cough,  No coughing up of blood.  No change in color of mucus.  No wheezing.  No chest wall deformity  Skin: no rash or lesions.  GU: no dysuria, change in color of urine, no urgency or frequency.  No flank pain, no hematuria   MS:  No joint   swelling.  No decreased range of motion.     Psych:  No change in mood or affect. No depression or anxiety.  No memory loss.   Objective:   Physical Exam     WD, Obese, 72 y/o WF in NAD... GENERAL:  Well developed, pleasant & cooperative 71 yo white female in no acute distress  HEENT:  Left ear clear. Right ear with minimum wax noted. Tenderness to external ear lobe. Small lesion just inside the canal. TMs translucent, pearly gray. No bleeding or discharge noted. Mucus membranes pink and moist. Oropharyngeal without exudate or lesions. Dryness and food residue noted upper palate . Dentures in place.  NECK:  Supple w/ fair ROM; no JVD; no thyromegaly or nodules palpated;no lymphadenopathy. CHEST:  Breath sounds present w/ no wheezing, rhonchi or rales.  HEART:  Regular rate and rhythm. No murmurs, rubs or gallops ABDOMEN:   Obese, soft & nontender; normal bowel sounds; no organomegaly or masses detected. EXT: without deformities, mild arthritic changes.  DERM:  Skin warm and dry. No rashes or lesions noted.      Assessment & Plan:

## 2011-09-18 NOTE — Assessment & Plan Note (Signed)
Plan:  Cortisporin 3 drops into the right ear 4 (four) times daily  No Q-tips, pins or anything in the ears Follow up with Dr. Kriste Basque in 3 months or as needed

## 2011-09-23 ENCOUNTER — Other Ambulatory Visit: Payer: Self-pay | Admitting: Pulmonary Disease

## 2011-09-23 ENCOUNTER — Telehealth: Payer: Self-pay | Admitting: Adult Health

## 2011-09-23 NOTE — Telephone Encounter (Signed)
Spoke with pt. She states that she is still using the ear drops prescribed by TP at ov on 09/18/11. Her rt ear is still throbbing and she is requesting that SN prescribe some oral abx. She is denying any other symptoms, no fever, cough, dizziness. Please advise thanks! Allergies  Allergen Reactions  . Metolazone     REACTION: pt states very sens to Zaroxyln

## 2011-09-24 MED ORDER — CEFDINIR 300 MG PO CAPS: 300.0000 mg | ORAL_CAPSULE | Freq: Two times a day (BID) | ORAL | Status: AC

## 2011-09-24 NOTE — Telephone Encounter (Signed)
Per TP---ok for pt to have omnicef 300mg  bid  #14 with no refills.  Pt is aware that if this does not clear this up pt is to let us know and we will get her in to see ENT.  Pt voiced her understanding of this.

## 2011-10-01 ENCOUNTER — Telehealth: Payer: Self-pay | Admitting: Pulmonary Disease

## 2011-10-01 MED ORDER — ALPRAZOLAM 0.5 MG PO TABS: 0.5000 mg | ORAL_TABLET | Freq: Four times a day (QID) | ORAL | Status: DC | PRN

## 2011-10-01 NOTE — Telephone Encounter (Signed)
Called rx into the pharmacy per pts request.  Called and spoke with pt and she is aware of rx sent to the pharmacy.

## 2011-10-23 ENCOUNTER — Other Ambulatory Visit: Payer: Self-pay | Admitting: *Deleted

## 2011-10-23 ENCOUNTER — Telehealth: Payer: Self-pay | Admitting: Pulmonary Disease

## 2011-10-23 MED ORDER — OMEPRAZOLE 20 MG PO CPDR: 20.0000 mg | DELAYED_RELEASE_CAPSULE | Freq: Two times a day (BID) | ORAL | Status: DC

## 2011-10-23 MED ORDER — GABAPENTIN 600 MG PO TABS: 600.0000 mg | ORAL_TABLET | Freq: Four times a day (QID) | ORAL | Status: DC

## 2011-10-23 MED ORDER — PREDNISONE (PAK) 5 MG PO TABS: ORAL_TABLET | ORAL | Status: DC

## 2011-10-23 MED ORDER — AMITRIPTYLINE HCL 50 MG PO TABS: 100.0000 mg | ORAL_TABLET | Freq: Every day | ORAL | Status: DC

## 2011-10-23 MED ORDER — AMOXICILLIN-POT CLAVULANATE 875-125 MG PO TABS: 1.0000 | ORAL_TABLET | Freq: Two times a day (BID) | ORAL | Status: AC

## 2011-10-23 MED ORDER — METOPROLOL TARTRATE 50 MG PO TABS: 50.0000 mg | ORAL_TABLET | Freq: Two times a day (BID) | ORAL | Status: DC

## 2011-10-23 MED ORDER — FUROSEMIDE 40 MG PO TABS: 40.0000 mg | ORAL_TABLET | Freq: Two times a day (BID) | ORAL | Status: DC

## 2011-10-23 MED ORDER — POTASSIUM CHLORIDE CRYS ER 20 MEQ PO TBCR: 20.0000 meq | EXTENDED_RELEASE_TABLET | Freq: Two times a day (BID) | ORAL | Status: DC

## 2011-10-23 NOTE — Telephone Encounter (Signed)
I spoke with pt and she c/o cough w. Chilton Si phlem, chest congestion, some nasal congestion, chills/sweats x 2 days. She states her cough hurts from all the coughing. Denies any fever/n/v. She is taking mucinex 2 po BID. She is requesting prednisone and abx be called in for her. Please advise SN, thanks  Allergies  Allergen Reactions  . Metolazone     REACTION: pt states very sens to Zaroxyln

## 2011-10-23 NOTE — Telephone Encounter (Signed)
Called and spoke with pt and she is aware of SN recs.  augmentin 875mg   #14  1 po bid and pred dosepak  5 mg   6 day pack take as directed .  Pt is aware that this has been sent to her pharmacy and pt is aware.

## 2011-12-15 ENCOUNTER — Telehealth: Payer: Self-pay | Admitting: Pulmonary Disease

## 2011-12-15 ENCOUNTER — Other Ambulatory Visit: Payer: Self-pay | Admitting: Pulmonary Disease

## 2011-12-15 DIAGNOSIS — D649 Anemia, unspecified: Secondary | ICD-10-CM

## 2011-12-15 DIAGNOSIS — N259 Disorder resulting from impaired renal tubular function, unspecified: Secondary | ICD-10-CM

## 2011-12-15 NOTE — Telephone Encounter (Signed)
Called and spoke with pt and she is aware of labs in the computer and nothing further needed.

## 2011-12-15 NOTE — Telephone Encounter (Signed)
Please advise on labs to order.Carron Curie, CMA

## 2011-12-22 ENCOUNTER — Other Ambulatory Visit (INDEPENDENT_AMBULATORY_CARE_PROVIDER_SITE_OTHER): Payer: Medicare Other

## 2011-12-22 ENCOUNTER — Ambulatory Visit (INDEPENDENT_AMBULATORY_CARE_PROVIDER_SITE_OTHER): Payer: Medicare Other | Admitting: Pulmonary Disease

## 2011-12-22 ENCOUNTER — Encounter: Payer: Self-pay | Admitting: Pulmonary Disease

## 2011-12-22 VITALS — BP 106/66 | HR 102 | Temp 97.0°F | Ht 68.0 in | Wt 254.6 lb

## 2011-12-22 DIAGNOSIS — K589 Irritable bowel syndrome without diarrhea: Secondary | ICD-10-CM

## 2011-12-22 DIAGNOSIS — F411 Generalized anxiety disorder: Secondary | ICD-10-CM

## 2011-12-22 DIAGNOSIS — R0989 Other specified symptoms and signs involving the circulatory and respiratory systems: Secondary | ICD-10-CM

## 2011-12-22 DIAGNOSIS — J449 Chronic obstructive pulmonary disease, unspecified: Secondary | ICD-10-CM

## 2011-12-22 DIAGNOSIS — Z79899 Other long term (current) drug therapy: Secondary | ICD-10-CM

## 2011-12-22 DIAGNOSIS — I4891 Unspecified atrial fibrillation: Secondary | ICD-10-CM

## 2011-12-22 DIAGNOSIS — IMO0001 Reserved for inherently not codable concepts without codable children: Secondary | ICD-10-CM

## 2011-12-22 DIAGNOSIS — E785 Hyperlipidemia, unspecified: Secondary | ICD-10-CM

## 2011-12-22 DIAGNOSIS — I872 Venous insufficiency (chronic) (peripheral): Secondary | ICD-10-CM

## 2011-12-22 DIAGNOSIS — R0600 Dyspnea, unspecified: Secondary | ICD-10-CM

## 2011-12-22 DIAGNOSIS — G894 Chronic pain syndrome: Secondary | ICD-10-CM

## 2011-12-22 DIAGNOSIS — N259 Disorder resulting from impaired renal tubular function, unspecified: Secondary | ICD-10-CM

## 2011-12-22 DIAGNOSIS — M545 Low back pain: Secondary | ICD-10-CM

## 2011-12-22 DIAGNOSIS — I2510 Atherosclerotic heart disease of native coronary artery without angina pectoris: Secondary | ICD-10-CM

## 2011-12-22 DIAGNOSIS — K219 Gastro-esophageal reflux disease without esophagitis: Secondary | ICD-10-CM

## 2011-12-22 LAB — BASIC METABOLIC PANEL
BUN: 21 mg/dL (ref 6–23)
Chloride: 102 mEq/L (ref 96–112)
Potassium: 5.3 mEq/L — ABNORMAL HIGH (ref 3.5–5.1)

## 2011-12-22 LAB — CBC WITH DIFFERENTIAL/PLATELET
Basophils Absolute: 0 10*3/uL (ref 0.0–0.1)
Eosinophils Absolute: 0.2 10*3/uL (ref 0.0–0.7)
Eosinophils Relative: 3.6 % (ref 0.0–5.0)
MCHC: 32.8 g/dL (ref 30.0–36.0)
MCV: 93 fl (ref 78.0–100.0)
Monocytes Absolute: 0.5 10*3/uL (ref 0.1–1.0)
Neutrophils Relative %: 52.1 % (ref 43.0–77.0)
Platelets: 128 10*3/uL — ABNORMAL LOW (ref 150.0–400.0)
WBC: 5.6 10*3/uL (ref 4.5–10.5)

## 2011-12-22 LAB — HEPATIC FUNCTION PANEL
ALT: 13 U/L (ref 0–35)
AST: 22 U/L (ref 0–37)
Total Bilirubin: 1 mg/dL (ref 0.3–1.2)

## 2011-12-22 LAB — BRAIN NATRIURETIC PEPTIDE: Pro B Natriuretic peptide (BNP): 179 pg/mL — ABNORMAL HIGH (ref 0.0–100.0)

## 2011-12-22 LAB — LIPID PANEL
Cholesterol: 104 mg/dL (ref 0–200)
LDL Cholesterol: 35 mg/dL (ref 0–99)

## 2011-12-22 LAB — TSH: TSH: 1.46 u[IU]/mL (ref 0.35–5.50)

## 2011-12-22 NOTE — Progress Notes (Signed)
Subjective:    Patient ID: [name], female    DOB: [birth year], 71 y.o.   MRN: [id number]  HPI 71 y/o WF here for a follow up visit... she has ASHD w/ prev CABG and chr AFib on coumadin followed by DrBerry... she also has FM/ chr pain syndrome followed by [name]... we have followed her primarily for COPD/ Asthmatic Bronchitis- see below...  ~ Apr11:  she had knee surg 1/11 by [name]... then fell (tripped at home) 09/12/09 & hosp x4d> Xrays showed T4 compression... had f/u eval DrTooke w/ L2 compression, DDD, DJD> had epid steroid shots w/ coordination thru [facility] coumadin clinic, but developed large ecchymosis right post lumbar area... protime shows PT 67/ INR 6.6 & coumadin on hold per [facility] we discussed the need for BMD testing & treatment> she will pursue this thru [facility] (prev DrRowe, now [employer]). Her breathing has been good since we added the SYMBICORT160> no exac, no need for antibiotics or prednisone... ~  Aug11:  she was hosp 6/11 by DrBerry for CP> no change in cath from 2009 w/ patent grafts, & meds adjusted w/ IMDUR started and she is improved... she is c/o bladder issues (?OB symptoms + urge incontinence etc)> we discussed trial of TOVIAZ 4mg  to start and appt w/ Urology for futher eval... mult other somatic complaints as well but breathing is stable;  unfortunately not able to lose signif weight despite trials of diets etc...  ~  Dec11:  79mo ROV c/o incr congestion, cough w/ green mucus, & wants Avelox... similar exac 9/11 treated by TP w/ Avelox, Pred, Mucinex & resolved... she states the Toviaz helped & she saw DrPeterson for Urology who tried something else but she likes the Toviaz better & requests refill...  ~  December 29, 2010:  673mo ROV & c/o recurrent URI symptoms w/ cough, incr SOB, sore left lat ribs, green sputum, congestion, etc; treated w/ Avelox x2, then Cipro & no better she says; we discussed how the color comes from the cellular elements in the sput & not  germs per se; she is also c/o tired all the time, fatigue, sick all the time, etc; we decided to wait for fever, ch in sputum & then culture it...    She saw DrBerry for [facility] 1/12> stable from the Cards standpoint & no changes made... Unfortunately her wt is up to 271# & she reminded me that she had Bariatric surg y[place] ago ([place] the 70's) & lost >100# but grad put it all back on... We checked her labs & everything is about the same x B12 is borderline at 279 & we will rec OTC Vit B12 supplement po qd...  ~  July 28, 2011:  73mo ROV & she has persistent AB symptoms despite Augmentin & Pred Rx called in> she knows that she must improve diet, exercise & get wt down... Also c/o aching all over, soreness, tender, HAs, etc c/w her FM & mult somatic complaints...  She saw DrBerry 10/12 for f/u CAD, s/p CABG 1997, AFib on Coumadin & rate control, Hyperlipidemia> she had false pos Myoview in 2009 & subseq cath that showed patent grafts & norm LVF; he felt she was stable & rec same meds, f/u 69yr... See prob list>>   ~  December 22, 2011:  77mo ROV & [name] is c/o aches & pains from her FM; she saw [name] for Ortho & had shots in her knee but told ?she was too high risk for surg? We  discussed this & I offered her second opinion consults w/ Ortho specialists at [facility]) or [name]) if she wants...     She saw [name] for [facility] 2/13> chr AFib, on Coumadin & ASA81mg /d, plus Metop25Bid/ ImDur30/ Lasix40Bid;  He added in the LANOXIN 0.25mg /d to help her ventric response==> she did not bring her med bottles or an updated list of her meds, it is unclear if she is still taking the Lanoxin per [facility]...      We reviewed prob list, meds, xrays and labs> see below>> LABS 6/13:  FLP- at goals on Simva20;  Chems- ok w/ Creat=1.7;  CBC- ok;  TSH=1.46;  BNP=179; A1c=5.9           Problem List:  COPD (ICD-496) - ex smoker, quit 15yrs... uses nebulizer w/ ALBUTEROL Qid (using 1-2 daily) vs Proventil HFA,  SYMBICORT 160- 2spBid & MUCINEX 1-2 Bid w/ fluids... baseline CXR shows prev CABG surg, borderline Cor, NAD... prev PFT's 12/05 showed more restriction (from effort and obesity) than obstruction... ~  CXR 10/09 in hosp showed biapical pleural thickening, NAD. ~  5/10:  bronchitic exas treated w/ Levaquin/ Medrol/ etc... 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 ~  12/10:  another exac requiring Levaquin/ Pred... we will add SYMBICORT160- 2sp Bid. ~  Improved w/ addition of Symbicort, but still c/o occas exac requiring antibiotics etc... ~  CXR 6/11 showed mild cardiomeg, s/p CABG, calcif Ao, clear/ NAD.Marland Kitchen. ~  CXR 1-2/13 showed borderline heart size, s/p CABG, lungs clear w/ min peribronch thickening, DJD spine, osteopenia... ~  PFT 6/13 showed FVC=2.01 (59%), FEV1=1.62 (64%), %1sec=81, mid-flows=81%pred==> more restricted than obstructed; aske to contue meds & LOSE WEIGHT!  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 s/p CABG in 1997...  on IMDUR 30mg /d, METOPROLOL 50mg Bid, LASIX 40mg Bid, and K20Bid... ~  NuclearStressTests in 8/05 showing infer scar and mild inferolat ischemia...  ~  2DEcho 10/07 showed EF=45-50% and DD...  ~  repeat Myoview in 9/08 showed no ischemia... ~  hosp 10/09 w/ CP- cath showed 90%proxLAD, normCIRC, subtotal midRCA, LIMA & 2 vein grafts patent;  LVF= 60% w/o regional wall motion abn... no change from 2004. ~  repeat cath 6/11 showed no change from 2009 and patent grafts... ~  Labs 6/13 look good & BNP= 179...  ATRIAL FIBRILLATION (ICD-427.31) - on COUMADIN per DrBerry... ~  4/11: complic after epidural shot w/ large ecchymosis right lumbar area, PT INR was >6, managed by [facility].  VENOUS INSUFFICIENCY (ICD-459.81) - she follows low sodium diet. elevates legs, wears support hose occas... she is on LASIX 40mg Bid per Cards... ~  9/12: [name] ordered VenDopplers- neg for DVT...  HYPERLIPIDEMIA (ICD-272.4) - on ZOCOR 20mg /d... ~  FLP 2/09  showed TChol 112, TG 76, HDL 43, LDL 54 ~  FLP 3/10 showed TChol 166, TG 137, HDL 40, LDL 98 ~  FLP 10/10 showed TChol 120, Tg 91, HDL 47, LDL 55 ~  12/11 & 4/54: not fasting for today's OV's... ~  4/12: note from [facility] showed TChol 99, TG 68, HDL 43, LDL 42 on Simva20. ~  FLP 0/98 on Simva20 showed TChol 110, TG 98, HDL 47, LDL 43 ~  FLP on 6/13 Simva20 showed TChol 104, TG 96, HDL 50, LDL 35  MORBID OBESITY (ICD-278.01) - we have discussed diet and exercise program on numerous occas in the past and again today! ~  weight 2/10 = 250# ~  weight 7/10 = 262# ~  weight  8/10 = 245# ~  weight 12/10 = 266# "it's a fighting battle" ~  weight 4/11 = 262# ~  weight 8/11 = 256# ~  weight 12/11 = 263#... we reviewed diet + exercise thereapy. ~  Weight 6/12 = 271# ~  Weight 1/13 = 266# ~  Weight 6/13 = 255#  GERD (ICD-530.81) - on OMEPRAZOLE 20mg Bid... ?[facility] changed to Aciphex?  IBS (ICD-564.1) - last colonoscopy was 9/07 by Dorris Singh and showed hems only... 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  RENAL INSUFFICIENCY (ICD-588.9) - Creat in the 1.6-1.7 range... ~  labs 2/09 showed BUN= 16, Creat= 1.6 ~  labs 3/10 showed BUN= 20, Creat= 1.7 ~  labs 4/11 showed BUN= 15, Creat= 1.6 ~  labs in hosp 6/11 showed BUN= 9, Creat= 1.3 ~  Labs here 6/12 showed BUN= 21, Creat= 1.6 ~  Labs 1/13 on Lasix40Bid showed BUN=15, Creat= 1.7 ~  Labs 6/13 on Lasix40Bid showed BUN= 21, Creat= 1.7  OTHER SYMPTOMS INVOLVING URINARY SYSTEM (ICD-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 she responded to Toviaz 8mg  for overact bladder symptoms==> no longer on her med list...  DEGENERATIVE JOINT DISEASE >> LOW BACK PAIN, CHRONIC - eval and treated by Tamala Julian, now DrBeekman> "they do it all now" w/ rechecks Q3-30months... she takes OXYCODONE 5/325, NEURONTIN 600mg Bid, etc... COMPRESSION FRACTURE - found to have compression T12 & L2 4/11 treated by NS-  DrTooke... CHRONIC PAIN SYNDROME - managed by Eligha Bridegroom... FIBROMYALGIA - on AMITRIPTYLINE 50mg - 2Qhs & FLEXERIL 10mg  Tid... PAGET'S DISEASE - on Observation per [facility], Rheumatology... ~  labs 2/09 showed AlkPhos= 79 ~  labs 3/10 showed AlkPhos= 86 ~  labs 4/11 showed AlkPhos= 70 ~  labs 6/11 in hosp showed AlkPhos = 71 ~  Bone Scan 6/11 showed stable Paget's distal left tibia, compress fx L2, OA of both knees. ~  Labs 6/13 showed AlkPhos= 86, remains wnl...  ANXIETY - on ALPRAZOLAM 0.5mg Qid...  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 Appendectomy   . Cholecystectomy   . Vesicovaginal fistula closure w/ tah   . Coronary artery bypass graft     Outpatient Encounter Prescriptions as of 12/22/2011  Medication Sig Dispense Refill  . albuterol (PROVENTIL) (2.5 MG/3ML) 0.083% nebulizer solution USE IN NEB TREATMENT UP TO FOUR TIMES A DAY AS NEEDED FOR WHEEZING...  150 mL  3  . ALPRAZolam (XANAX) 0.5 MG tablet Take 1 tablet (0.5 mg total) by mouth 4 (four) times daily as needed for anxiety.  120 tablet  1  . amitriptyline (ELAVIL) 50 MG tablet Take 2 tablets (100 mg total) by mouth at bedtime.  180 tablet  0  . budesonide-formoterol (SYMBICORT) 160-4.5 MCG/ACT inhaler Inhale 2 puffs into the lungs 2 (two) times daily.        . Calcium Carbonate-Vitamin D (CALCIUM-VITAMIN D) 500-200 MG-UNIT per tablet Take 1 tablet by mouth 2 (two) times daily with a meal.      . cyclobenzaprine (FLEXERIL) 10 MG tablet Take 10 mg by mouth 3 (three) times daily as needed.       . furosemide (LASIX) 40 MG tablet Take 1 tablet (40 mg total) by mouth 2 (two) times daily.  180 tablet  0  . gabapentin (NEURONTIN) 600 MG tablet Take 1 tablet (600 mg total) by mouth 4 (four) times daily.  360 tablet  0  . isosorbide mononitrate (IMDUR) 30 MG  24 hr tablet Take 30 mg by mouth daily.        . metoprolol (LOPRESSOR) 50 MG tablet Take  1 tablet (50 mg total) by mouth 2 (two) times daily.  180 tablet  0  . omeprazole (PRILOSEC) 20 MG capsule Take 1 capsule (20 mg total) by mouth 2 (two) times daily.  180 capsule  0  . oxyCODONE-acetaminophen (PERCOCET) 5-325 MG per tablet Take 1 tablet by mouth every 4 (four) hours as needed.       . potassium chloride SA (KLOR-CON M20) 20 MEQ tablet Take 1 tablet (20 mEq total) by mouth 2 (two) times daily.  180 tablet  0  . promethazine (PHENERGAN) 25 MG tablet Take 1 tablet (25 mg total) by mouth every 6 (six) hours as needed.  30 tablet  5  . Pseudoephedrine-Guaifenesin (MUCINEX D) [phone] MG TB12 Take 1 tablet by mouth 2 (two) times daily.        . simvastatin (ZOCOR) 20 MG tablet Take 20 mg by mouth at bedtime.        . vitamin B-12 (CYANOCOBALAMIN) 1000 MCG tablet Take 1,000 mcg by mouth daily.        Marland Kitchen warfarin (COUMADIN) 5 MG tablet Take 5 mg by mouth See admin instructions. 5mg  Sunday, Tuesday, Thursday   2.5mg  all other days      . DISCONTD: predniSONE (STERAPRED UNI-PAK) 5 MG TABS Take as directed.  Please give a 6 day pack  21 each  0    Allergies  Allergen Reactions  . Metolazone     REACTION: pt states very sens to Zaroxyln    Current Medications, Allergies, Past Medical History, Past Surgical History, Family History, and Social History were reviewed in [employer] record.    Review of Systems         See HPI - all other systems neg except as noted... The patient complains of decreased hearing, dyspnea on exertion, headaches, incontinence, and depression.  The patient denies anorexia, fever, weight loss, weight gain, vision loss, hoarseness, chest pain, syncope, peripheral edema, prolonged cough, hemoptysis, abdominal pain, melena, hematochezia, severe indigestion/heartburn, hematuria, muscle weakness, suspicious skin lesions, transient blindness, difficulty walking, unusual weight change, abnormal bleeding, enlarged lymph nodes, and angioedema.     Objective:   Physical Exam     WD, Obese, 71 y/o WF in NAD... GENERAL:  Alert & oriented; pleasant & cooperative... HEENT:  [place]/AT, EOM- full, PERRLA, EACs-clear, TMs-wnl, NOSE-clear, THROAT-clear & wnl. NECK:  Supple w/ fairROM; no JVD; normal carotid impulses w/o bruits; no thyromegaly or nodules palpated; no lymphadenopathy. CHEST:  Coarse BS w/ no wheezing, no rales, no signs of consolidation... HEART:  Regular Rhythm; without murmurs/ rubs/ or gallops heard... ABDOMEN:  Obese, soft & nontender; normal bowel sounds; no organomegaly or masses detected. EXT: without deformities, mild arthritic changes; no varicose veins/ +venous insuffic/ 1+ edema. NEURO:  no focal neuro deficits... DERM:  along right lateral chest wall, under breast & over the costal margin- scarring in skin from shingles rash...  RADIOLOGY DATA:  Reviewed in the EPIC EMR & discussed w/ the patient...  LABORATORY DATA:  Reviewed in the EPIC EMR & discussed w/ the patient...   Assessment & Plan:   COPD>  Stable overall despite her chronic symptoms> continue NEBS, Symbicort, Mucinex, Fluids;  I told her no more antibiotics w/o approp culture data...  ASHD/ CAD/ s/p CABG>  [facility]; continue same meds; they will need to do pre-op  cardiac clearance...  AFIB>  On Coumadin per [facility], continue same Rx...  HYPERLIPIDEMIA>  FLP looks great, continue Simva20...  OBESITY>  We reviewed diet & exercise program; recall remote hx of bariatric surg...  Renal Insuffic>  Creat= 1.7 on her current regimen; advised to drink plenty of water & maintain hydration...  ORTHO>  LBP/ Compression fx/ FM/ Paget's/ Chr Pain Syndrome> managed by [name] at [facility] he took [facility]; we discussed checking w/ DrBeekman before considering Ortho surg...  Anxiety>  On Alpraz prn...   Patient's Medications  New Prescriptions   No medications on file  Previous Medications   ALBUTEROL (PROVENTIL) (2.5 MG/3ML)  0.083% NEBULIZER SOLUTION    USE IN NEB TREATMENT UP TO FOUR TIMES A DAY AS NEEDED FOR WHEEZING...   ALPRAZOLAM (XANAX) 0.5 MG TABLET    Take 1 tablet (0.5 mg total) by mouth 4 (four) times daily as needed for anxiety.   AMITRIPTYLINE (ELAVIL) 50 MG TABLET    Take 2 tablets (100 mg total) by mouth at bedtime.   BUDESONIDE-FORMOTEROL (SYMBICORT) 160-4.5 MCG/ACT INHALER    Inhale 2 puffs into the lungs 2 (two) times daily.     CALCIUM CARBONATE-VITAMIN D (CALCIUM-VITAMIN D) 500-200 MG-UNIT PER TABLET    Take 1 tablet by mouth 2 (two) times daily with a meal.   CYCLOBENZAPRINE (FLEXERIL) 10 MG TABLET    Take 10 mg by mouth 3 (three) times daily as needed.    FUROSEMIDE (LASIX) 40 MG TABLET    Take 1 tablet (40 mg total) by mouth 2 (two) times daily.   GABAPENTIN (NEURONTIN) 600 MG TABLET    Take 1 tablet (600 mg total) by mouth 4 (four) times daily.   ISOSORBIDE MONONITRATE (IMDUR) 30 MG 24 HR TABLET    Take 30 mg by mouth daily.     METOPROLOL (LOPRESSOR) 50 MG TABLET    Take 1 tablet (50 mg total) by mouth 2 (two) times daily.   OMEPRAZOLE (PRILOSEC) 20 MG CAPSULE    Take 1 capsule (20 mg total) by mouth 2 (two) times daily.   OXYCODONE-ACETAMINOPHEN (PERCOCET) 5-325 MG PER TABLET    Take 1 tablet by mouth every 4 (four) hours as needed.    POTASSIUM CHLORIDE SA (KLOR-CON M20) 20 MEQ TABLET    Take 1 tablet (20 mEq total) by mouth 2 (two) times daily.   PROMETHAZINE (PHENERGAN) 25 MG TABLET    Take 1 tablet (25 mg total) by mouth every 6 (six) hours as needed.   PSEUDOEPHEDRINE-GUAIFENESIN (MUCINEX D) [phone] MG TB12    Take 1 tablet by mouth 2 (two) times daily.     SIMVASTATIN (ZOCOR) 20 MG TABLET    Take 20 mg by mouth at bedtime.     VITAMIN B-12 (CYANOCOBALAMIN) 1000 MCG TABLET    Take 1,000 mcg by mouth daily.     WARFARIN (COUMADIN) 5 MG TABLET    Take 5 mg by mouth See admin instructions. 5mg  Sunday, Tuesday, Thursday   2.5mg  all other days  Modified Medications   No medications on file    Discontinued Medications   PREDNISONE (STERAPRED UNI-PAK) 5 MG TABS    Take as directed.  Please give a 6 day pack

## 2011-12-22 NOTE — Patient Instructions (Addendum)
Today we updated your med list in our EPIC system...    Continue your current medications the same...  Today we did a Pulmonary function test>>    This is the first step in determining if you are a candidate for knee replacements...    This test indicates combined RESTRICTIVE diease from obesity & an obstructive component>>       REC> continue current meds and GET ON DIET, GET WEIGHT DOWN!!!  You will also need Cardiac assessment from DrBerry,    and Rheumatology referral from [facility]...  In the meanwhile you need to keep up the good work w/ diet, exercise, & weight reduction...  Call for any questions..  Let's continue our 6 month follow up visits.Marland KitchenMarland Kitchen

## 2011-12-23 ENCOUNTER — Telehealth: Payer: Self-pay | Admitting: Pulmonary Disease

## 2011-12-23 NOTE — Telephone Encounter (Signed)
Spoke with pt. She states that to to increased pain and inability to do things how she used to, she is having increased anxiety. She is taking alprazolam 0.5 mg qid prn- she states always needs to take 4 daily. She states that med does not seem to be working as well as it did in the past. Requesting to have her dosage increased. Please advise, thanks!

## 2011-12-24 NOTE — Telephone Encounter (Signed)
Per SN---0.5 mg  Qid is SN max dose.  If she is needing more then she must see psychiatrist----SN understands that some people do need more.  thanks

## 2011-12-24 NOTE — Telephone Encounter (Signed)
Spoke with pt and notified of recs per SN. She verbalized understanding. Declines referral for right now and will call back if she changes her mind. Nothing further needed.

## 2011-12-30 ENCOUNTER — Ambulatory Visit: Payer: Medicare Other | Admitting: Gynecology

## 2012-01-05 ENCOUNTER — Telehealth: Payer: Self-pay | Admitting: Pulmonary Disease

## 2012-01-05 MED ORDER — ALPRAZOLAM 0.5 MG PO TABS: 0.5000 mg | ORAL_TABLET | Freq: Four times a day (QID) | ORAL | Status: DC | PRN

## 2012-01-05 NOTE — Telephone Encounter (Signed)
Pharmacy called requesting rx for  Xanax # 120 take up to 4 a day. Last fill was 5 18/2013 Allergies  Allergen Reactions  . Metolazone     REACTION: pt states very sens to Zaroxyln   [name] is this ok to fill  Thank you

## 2012-01-06 ENCOUNTER — Telehealth: Payer: Self-pay | Admitting: Pulmonary Disease

## 2012-01-06 NOTE — Telephone Encounter (Signed)
Spoke with pt. She states needs note for surgery clearance for knee surgery sent to Dr. Thomasena Edis at [facility]. She wanted the recent labs done here faxed to Dr. [name] and this was done. Please advise on surgery clearance thanks!

## 2012-01-07 NOTE — Telephone Encounter (Signed)
Called and spoke with pt and she is aware that lab results have been faxed to Dr. Allyson Sabal and that her surgical clearance has been faxed to Dr. Thomasena Edis at [facility] ortho.  Pt voiced her understanding.

## 2012-01-10 ENCOUNTER — Other Ambulatory Visit: Payer: Self-pay | Admitting: Pulmonary Disease

## 2012-01-19 DIAGNOSIS — I4891 Unspecified atrial fibrillation: Secondary | ICD-10-CM

## 2012-01-19 HISTORY — DX: Unspecified atrial fibrillation: I48.91

## 2012-01-25 ENCOUNTER — Ambulatory Visit: Payer: Medicare Other | Admitting: Pulmonary Disease

## 2012-01-26 ENCOUNTER — Telehealth: Payer: Self-pay | Admitting: Pulmonary Disease

## 2012-01-26 MED ORDER — AMOXICILLIN-POT CLAVULANATE 875-125 MG PO TABS: 1.0000 | ORAL_TABLET | Freq: Two times a day (BID) | ORAL | Status: AC

## 2012-01-26 NOTE — Telephone Encounter (Signed)
Per SN--cont nebs, use the mucinex 2 po bid with plenty of fluids and call in augmentin 875mg   #14  1 po bid.  Will need to follow up with her ortho doctor for the back pain, pain in legs from the trauma of the fall.  thanks

## 2012-01-26 NOTE — Telephone Encounter (Signed)
Spoke with pt. She is c/o prod cough for the past several days- only able to produce sputum after neb txs and gets up large amounts of green sputum. She states also having pain in legs and back from fall she had in her home x 1 wk ago. She states that she the pain gets worse when she coughs. Taking mucinex 1200 mg bid and also percocet for pain with minimal relief. Would like ov with SN or TP. Neither provider with openings. Please advise, thanks! Allergies  Allergen Reactions  . Metolazone     REACTION: pt states very sens to Zaroxyln

## 2012-01-26 NOTE — Telephone Encounter (Signed)
Spoke with pt and notified of recs per SN. She verbalized understanding and states nothing further needed. Rx was sent to pharm for augmentin.

## 2012-02-03 ENCOUNTER — Telehealth: Payer: Self-pay | Admitting: Pulmonary Disease

## 2012-02-03 NOTE — Telephone Encounter (Signed)
Pt went and see ortho today. She states they are telling her they never received surgical clearance from SN a couple weeks ago. Pt states she is needing this re faxed to [phone]. I do not see the form in chart. Please advise leigh thanks

## 2012-02-05 NOTE — Telephone Encounter (Signed)
All papers have been refaxed back to ortho for surgical clearance by Bjorn Loser and mailed.  Nothing further needed.

## 2012-02-12 ENCOUNTER — Other Ambulatory Visit: Payer: Self-pay | Admitting: Pulmonary Disease

## 2012-02-18 ENCOUNTER — Other Ambulatory Visit: Payer: Self-pay | Admitting: Pain Medicine

## 2012-03-02 ENCOUNTER — Other Ambulatory Visit: Payer: Self-pay | Admitting: Pulmonary Disease

## 2012-04-05 ENCOUNTER — Encounter (HOSPITAL_COMMUNITY): Payer: Self-pay | Admitting: Pharmacy Technician

## 2012-04-07 ENCOUNTER — Other Ambulatory Visit: Payer: Self-pay

## 2012-04-07 ENCOUNTER — Encounter (HOSPITAL_COMMUNITY): Payer: Self-pay | Admitting: *Deleted

## 2012-04-07 ENCOUNTER — Telehealth: Payer: Self-pay | Admitting: Pulmonary Disease

## 2012-04-07 ENCOUNTER — Inpatient Hospital Stay (HOSPITAL_COMMUNITY)
Admission: EM | Admit: 2012-04-07 | Discharge: 2012-04-13 | DRG: 378 | Disposition: A | Discharge status: Home / Self Care | Payer: Medicare Other | Attending: Internal Medicine | Admitting: Internal Medicine

## 2012-04-07 ENCOUNTER — Emergency Department (HOSPITAL_COMMUNITY): Payer: Medicare Other

## 2012-04-07 DIAGNOSIS — Z951 Presence of aortocoronary bypass graft: Secondary | ICD-10-CM

## 2012-04-07 DIAGNOSIS — T45515A Adverse effect of anticoagulants, initial encounter: Secondary | ICD-10-CM | POA: Diagnosis present

## 2012-04-07 DIAGNOSIS — K922 Gastrointestinal hemorrhage, unspecified: Secondary | ICD-10-CM

## 2012-04-07 DIAGNOSIS — K219 Gastro-esophageal reflux disease without esophagitis: Secondary | ICD-10-CM | POA: Diagnosis present

## 2012-04-07 DIAGNOSIS — D649 Anemia, unspecified: Secondary | ICD-10-CM

## 2012-04-07 DIAGNOSIS — I2510 Atherosclerotic heart disease of native coronary artery without angina pectoris: Secondary | ICD-10-CM | POA: Diagnosis present

## 2012-04-07 DIAGNOSIS — Z22322 Carrier or suspected carrier of Methicillin resistant Staphylococcus aureus: Secondary | ICD-10-CM

## 2012-04-07 DIAGNOSIS — H6091 Unspecified otitis externa, right ear: Secondary | ICD-10-CM

## 2012-04-07 DIAGNOSIS — Z9181 History of falling: Secondary | ICD-10-CM

## 2012-04-07 DIAGNOSIS — J209 Acute bronchitis, unspecified: Secondary | ICD-10-CM

## 2012-04-07 DIAGNOSIS — N259 Disorder resulting from impaired renal tubular function, unspecified: Secondary | ICD-10-CM

## 2012-04-07 DIAGNOSIS — I279 Pulmonary heart disease, unspecified: Secondary | ICD-10-CM | POA: Diagnosis present

## 2012-04-07 DIAGNOSIS — M1710 Unilateral primary osteoarthritis, unspecified knee: Secondary | ICD-10-CM | POA: Diagnosis present

## 2012-04-07 DIAGNOSIS — M889 Osteitis deformans of unspecified bone: Secondary | ICD-10-CM

## 2012-04-07 DIAGNOSIS — N189 Chronic kidney disease, unspecified: Secondary | ICD-10-CM | POA: Diagnosis present

## 2012-04-07 DIAGNOSIS — J40 Bronchitis, not specified as acute or chronic: Secondary | ICD-10-CM

## 2012-04-07 DIAGNOSIS — F411 Generalized anxiety disorder: Secondary | ICD-10-CM

## 2012-04-07 DIAGNOSIS — M545 Low back pain: Secondary | ICD-10-CM

## 2012-04-07 DIAGNOSIS — I959 Hypotension, unspecified: Secondary | ICD-10-CM

## 2012-04-07 DIAGNOSIS — T148XXA Other injury of unspecified body region, initial encounter: Secondary | ICD-10-CM

## 2012-04-07 DIAGNOSIS — J4489 Other specified chronic obstructive pulmonary disease: Secondary | ICD-10-CM | POA: Diagnosis present

## 2012-04-07 DIAGNOSIS — N3289 Other specified disorders of bladder: Secondary | ICD-10-CM

## 2012-04-07 DIAGNOSIS — I4891 Unspecified atrial fibrillation: Secondary | ICD-10-CM

## 2012-04-07 DIAGNOSIS — R3989 Other symptoms and signs involving the genitourinary system: Secondary | ICD-10-CM

## 2012-04-07 DIAGNOSIS — K921 Melena: Secondary | ICD-10-CM

## 2012-04-07 DIAGNOSIS — G894 Chronic pain syndrome: Secondary | ICD-10-CM | POA: Diagnosis present

## 2012-04-07 DIAGNOSIS — Z23 Encounter for immunization: Secondary | ICD-10-CM

## 2012-04-07 DIAGNOSIS — K589 Irritable bowel syndrome without diarrhea: Secondary | ICD-10-CM | POA: Diagnosis present

## 2012-04-07 DIAGNOSIS — B0229 Other postherpetic nervous system involvement: Secondary | ICD-10-CM

## 2012-04-07 DIAGNOSIS — B029 Zoster without complications: Secondary | ICD-10-CM

## 2012-04-07 DIAGNOSIS — E785 Hyperlipidemia, unspecified: Secondary | ICD-10-CM | POA: Diagnosis present

## 2012-04-07 DIAGNOSIS — I5032 Chronic diastolic (congestive) heart failure: Secondary | ICD-10-CM | POA: Diagnosis present

## 2012-04-07 DIAGNOSIS — Z7901 Long term (current) use of anticoagulants: Secondary | ICD-10-CM

## 2012-04-07 DIAGNOSIS — E876 Hypokalemia: Secondary | ICD-10-CM | POA: Diagnosis not present

## 2012-04-07 DIAGNOSIS — R791 Abnormal coagulation profile: Secondary | ICD-10-CM | POA: Diagnosis present

## 2012-04-07 DIAGNOSIS — R079 Chest pain, unspecified: Secondary | ICD-10-CM

## 2012-04-07 DIAGNOSIS — J449 Chronic obstructive pulmonary disease, unspecified: Secondary | ICD-10-CM

## 2012-04-07 DIAGNOSIS — R195 Other fecal abnormalities: Secondary | ICD-10-CM

## 2012-04-07 DIAGNOSIS — R55 Syncope and collapse: Secondary | ICD-10-CM

## 2012-04-07 DIAGNOSIS — I872 Venous insufficiency (chronic) (peripheral): Secondary | ICD-10-CM | POA: Diagnosis present

## 2012-04-07 DIAGNOSIS — Z87891 Personal history of nicotine dependence: Secondary | ICD-10-CM

## 2012-04-07 DIAGNOSIS — IMO0001 Reserved for inherently not codable concepts without codable children: Secondary | ICD-10-CM

## 2012-04-07 DIAGNOSIS — D689 Coagulation defect, unspecified: Secondary | ICD-10-CM

## 2012-04-07 DIAGNOSIS — D62 Acute posthemorrhagic anemia: Secondary | ICD-10-CM | POA: Diagnosis present

## 2012-04-07 HISTORY — DX: Chronic obstructive pulmonary disease, unspecified: J44.9

## 2012-04-07 HISTORY — DX: Long term (current) use of anticoagulants: Z79.01

## 2012-04-07 HISTORY — DX: Gastrointestinal hemorrhage, unspecified: K92.2

## 2012-04-07 LAB — COMPREHENSIVE METABOLIC PANEL
ALT: 9 U/L (ref 0–35)
AST: 18 U/L (ref 0–37)
Albumin: 3.9 g/dL (ref 3.5–5.2)
CO2: 29 mEq/L (ref 19–32)
Chloride: 95 mEq/L — ABNORMAL LOW (ref 96–112)
Creatinine, Ser: 1.5 mg/dL — ABNORMAL HIGH (ref 0.50–1.10)
GFR calc non Af Amer: 34 mL/min — ABNORMAL LOW (ref >90)
Sodium: 133 mEq/L — ABNORMAL LOW (ref 135–145)
Total Bilirubin: 0.5 mg/dL (ref 0.3–1.2)

## 2012-04-07 LAB — CBC WITH DIFFERENTIAL/PLATELET
Basophils Absolute: 0 10*3/uL (ref 0.0–0.1)
Basophils Relative: 1 % (ref 0–1)
Lymphocytes Relative: 23 % (ref 12–46)
MCHC: 30.5 g/dL (ref 30.0–36.0)
Monocytes Absolute: 0.3 10*3/uL (ref 0.1–1.0)
Neutro Abs: 3.1 10*3/uL (ref 1.7–7.7)
Neutrophils Relative %: 66 % (ref 43–77)
Platelets: 157 10*3/uL (ref 150–400)
RDW: 17.7 % — ABNORMAL HIGH (ref 11.5–15.5)
WBC: 4.7 10*3/uL (ref 4.0–10.5)

## 2012-04-07 LAB — POCT I-STAT TROPONIN I

## 2012-04-07 MED ORDER — MECLIZINE HCL 25 MG PO TABS: ORAL_TABLET | ORAL | Status: DC

## 2012-04-07 NOTE — ED Notes (Signed)
Patient with chest pain that has been going on for about a week that radiates to her back.  She did not think anything of it since she has been coughing.  Patient also c/o near syncopal episode and when she called her MD she was told to come to ED for evaluation.  Patient has been having increased shortness of breath.

## 2012-04-07 NOTE — Telephone Encounter (Signed)
Pt aware of RX for Meclizine and SN recs to contact Dr. Allyson Sabal to see if her lasix needs to be adjusted as well. Pt verbalized understanding of this. RX has been sent.

## 2012-04-07 NOTE — Telephone Encounter (Signed)
The pt c/o dizziness upon standing and walking from room to room x 1 week. She states that she is fine while sitting and does not have this if turning her head. She denies being on any new medications. She says that SN has fiven her something for inner ear in the past and wants to know if she could get something like that.  Allergies  Allergen Reactions  . Metolazone     REACTION: pt states very sens to Zaroxyln

## 2012-04-07 NOTE — Telephone Encounter (Signed)
Per SN----ok to send in meclizine 25 mg   #50  1 po every 4-6 hours as needed for dizziness BUT this sounds like postural hypotension and she should call Dr. Allyson Sabal to see if he wants to decrease her lasix.  thanks

## 2012-04-08 ENCOUNTER — Encounter (HOSPITAL_COMMUNITY): Payer: Self-pay | Admitting: Internal Medicine

## 2012-04-08 DIAGNOSIS — I2510 Atherosclerotic heart disease of native coronary artery without angina pectoris: Secondary | ICD-10-CM

## 2012-04-08 DIAGNOSIS — D689 Coagulation defect, unspecified: Secondary | ICD-10-CM

## 2012-04-08 DIAGNOSIS — K921 Melena: Secondary | ICD-10-CM

## 2012-04-08 DIAGNOSIS — K922 Gastrointestinal hemorrhage, unspecified: Principal | ICD-10-CM

## 2012-04-08 DIAGNOSIS — Z7901 Long term (current) use of anticoagulants: Secondary | ICD-10-CM

## 2012-04-08 DIAGNOSIS — D62 Acute posthemorrhagic anemia: Secondary | ICD-10-CM

## 2012-04-08 DIAGNOSIS — R55 Syncope and collapse: Secondary | ICD-10-CM

## 2012-04-08 HISTORY — DX: Long term (current) use of anticoagulants: Z79.01

## 2012-04-08 LAB — CBC
HCT: 29.1 % — ABNORMAL LOW (ref 36.0–46.0)
Hemoglobin: 8.5 g/dL — ABNORMAL LOW (ref 12.0–15.0)
MCH: 29.4 pg (ref 26.0–34.0)
MCH: 29.9 pg (ref 26.0–34.0)
MCHC: 31.6 g/dL (ref 30.0–36.0)
MCV: 93 fL (ref 78.0–100.0)
Platelets: 143 10*3/uL — ABNORMAL LOW (ref 150–400)
Platelets: 135 10*3/uL — ABNORMAL LOW (ref 150–400)
RBC: 2.84 MIL/uL — ABNORMAL LOW (ref 3.87–5.11)
RDW: 17.2 % — ABNORMAL HIGH (ref 11.5–15.5)

## 2012-04-08 LAB — HEMOGLOBIN AND HEMATOCRIT, BLOOD
HCT: 23.9 % — ABNORMAL LOW (ref 36.0–46.0)
Hemoglobin: 7.4 g/dL — ABNORMAL LOW (ref 12.0–15.0)

## 2012-04-08 LAB — COMPREHENSIVE METABOLIC PANEL
ALT: 7 U/L (ref 0–35)
AST: 16 U/L (ref 0–37)
Albumin: 3.8 g/dL (ref 3.5–5.2)
Alkaline Phosphatase: 116 U/L (ref 39–117)
BUN: 26 mg/dL — ABNORMAL HIGH (ref 6–23)
Chloride: 105 mEq/L (ref 96–112)
Potassium: 4 mEq/L (ref 3.5–5.1)
Sodium: 144 mEq/L (ref 135–145)
Total Bilirubin: 1.1 mg/dL (ref 0.3–1.2)
Total Protein: 6.8 g/dL (ref 6.0–8.3)

## 2012-04-08 LAB — PROTIME-INR
INR: 1.43 (ref 0.00–1.49)
Prothrombin Time: 23.4 seconds — ABNORMAL HIGH (ref 11.6–15.2)
Prothrombin Time: 17.1 seconds — ABNORMAL HIGH (ref 11.6–15.2)

## 2012-04-08 LAB — TROPONIN I
Troponin I: <0.3 ng/mL (ref <0.30)
Troponin I: <0.3 ng/mL (ref <0.30)

## 2012-04-08 LAB — OCCULT BLOOD, POC DEVICE: Fecal Occult Bld: POSITIVE

## 2012-04-08 LAB — MRSA PCR SCREENING: MRSA by PCR: POSITIVE — AB

## 2012-04-08 MED ORDER — SODIUM CHLORIDE 0.9 % IJ SOLN
3.0000 mL | Freq: Two times a day (BID) | INTRAMUSCULAR | Status: DC
  Administered 2012-04-08: 3 mL via INTRAVENOUS

## 2012-04-08 MED ORDER — LEVALBUTEROL HCL 0.63 MG/3ML IN NEBU
0.6300 mg | INHALATION_SOLUTION | RESPIRATORY_TRACT | Status: DC | PRN
  Filled 2012-04-08: qty 3

## 2012-04-08 MED ORDER — SODIUM CHLORIDE 0.9 % IV BOLUS (SEPSIS): 1000.0000 mL | Freq: Once | INTRAVENOUS | Status: DC

## 2012-04-08 MED ORDER — LEVALBUTEROL HCL 0.63 MG/3ML IN NEBU
0.6300 mg | INHALATION_SOLUTION | Freq: Four times a day (QID) | RESPIRATORY_TRACT | Status: DC | PRN
  Filled 2012-04-08: qty 3

## 2012-04-08 MED ORDER — FUROSEMIDE 10 MG/ML IJ SOLN
40.0000 mg | Freq: Once | INTRAMUSCULAR | Status: AC
  Administered 2012-04-08: 40 mg via INTRAVENOUS
  Filled 2012-04-08: qty 4

## 2012-04-08 MED ORDER — ASPIRIN 81 MG PO CHEW: 324.0000 mg | CHEWABLE_TABLET | Freq: Once | ORAL | Status: DC

## 2012-04-08 MED ORDER — PNEUMOCOCCAL VAC POLYVALENT 25 MCG/0.5ML IJ INJ
0.5000 mL | INJECTION | INTRAMUSCULAR | Status: AC
  Administered 2012-04-09: 0.5 mL via INTRAMUSCULAR
  Filled 2012-04-08: qty 0.5

## 2012-04-08 MED ORDER — SODIUM CHLORIDE 0.9 % IV SOLN
INTRAVENOUS | Status: DC
  Administered 2012-04-08: 150 mL/h via INTRAVENOUS

## 2012-04-08 MED ORDER — IPRATROPIUM BROMIDE 0.02 % IN SOLN
0.5000 mg | Freq: Four times a day (QID) | RESPIRATORY_TRACT | Status: DC
  Administered 2012-04-08 – 2012-04-13 (×20): 0.5 mg via RESPIRATORY_TRACT
  Filled 2012-04-08 (×21): qty 2.5

## 2012-04-08 MED ORDER — LEVALBUTEROL HCL 0.63 MG/3ML IN NEBU
0.6300 mg | INHALATION_SOLUTION | Freq: Four times a day (QID) | RESPIRATORY_TRACT | Status: DC
  Administered 2012-04-08 – 2012-04-13 (×20): 0.63 mg via RESPIRATORY_TRACT
  Filled 2012-04-08 (×24): qty 3

## 2012-04-08 MED ORDER — METOPROLOL TARTRATE 1 MG/ML IV SOLN
2.5000 mg | INTRAVENOUS | Status: DC
  Administered 2012-04-08 – 2012-04-09 (×7): 2.5 mg via INTRAVENOUS
  Filled 2012-04-08 (×12): qty 5

## 2012-04-08 MED ORDER — HYDROMORPHONE HCL PF 1 MG/ML IJ SOLN
1.0000 mg | Freq: Once | INTRAMUSCULAR | Status: DC
  Filled 2012-04-08: qty 1

## 2012-04-08 MED ORDER — MORPHINE SULFATE 2 MG/ML IJ SOLN
1.0000 mg | INTRAMUSCULAR | Status: DC | PRN
  Administered 2012-04-08 – 2012-04-09 (×3): 2 mg via INTRAVENOUS
  Filled 2012-04-08 (×4): qty 1

## 2012-04-08 MED ORDER — LORAZEPAM 2 MG/ML IJ SOLN: 0.5000 mg | Freq: Four times a day (QID) | INTRAMUSCULAR | Status: DC | PRN

## 2012-04-08 MED ORDER — SODIUM CHLORIDE 0.9 % IV SOLN: INTRAVENOUS | Status: DC

## 2012-04-08 MED ORDER — VITAMIN K1 10 MG/ML IJ SOLN
10.0000 mg | Freq: Once | INTRAVENOUS | Status: AC
  Administered 2012-04-08: 10 mg via INTRAVENOUS
  Filled 2012-04-08: qty 1

## 2012-04-08 MED ORDER — INFLUENZA VIRUS VACC SPLIT PF IM SUSP
0.5000 mL | INTRAMUSCULAR | Status: AC
  Administered 2012-04-09: 0.5 mL via INTRAMUSCULAR
  Filled 2012-04-08: qty 0.5

## 2012-04-08 MED ORDER — ONDANSETRON HCL 4 MG PO TABS: 4.0000 mg | ORAL_TABLET | Freq: Four times a day (QID) | ORAL | Status: DC | PRN

## 2012-04-08 MED ORDER — SODIUM CHLORIDE 0.9 % IV SOLN
INTRAVENOUS | Status: DC
  Administered 2012-04-08: 75 mL/h via INTRAVENOUS

## 2012-04-08 MED ORDER — MUPIROCIN 2 % EX OINT
1.0000 "application " | TOPICAL_OINTMENT | Freq: Two times a day (BID) | CUTANEOUS | Status: AC
  Administered 2012-04-08 – 2012-04-13 (×10): 1 via NASAL
  Filled 2012-04-08 (×2): qty 22

## 2012-04-08 MED ORDER — ONDANSETRON HCL 4 MG/2ML IJ SOLN: 4.0000 mg | Freq: Four times a day (QID) | INTRAMUSCULAR | Status: DC | PRN

## 2012-04-08 MED ORDER — SODIUM CHLORIDE 0.9 % IV BOLUS (SEPSIS)
1000.0000 mL | Freq: Once | INTRAVENOUS | Status: AC
  Administered 2012-04-08: 1000 mL via INTRAVENOUS

## 2012-04-08 MED ORDER — ACETAMINOPHEN 325 MG PO TABS
650.0000 mg | ORAL_TABLET | Freq: Four times a day (QID) | ORAL | Status: DC | PRN
  Administered 2012-04-08 – 2012-04-13 (×3): 650 mg via ORAL
  Filled 2012-04-08 (×3): qty 2

## 2012-04-08 MED ORDER — HYDROMORPHONE HCL PF 1 MG/ML IJ SOLN
0.5000 mg | Freq: Once | INTRAMUSCULAR | Status: AC
  Administered 2012-04-08: 0.5 mg via INTRAVENOUS

## 2012-04-08 MED ORDER — CHLORHEXIDINE GLUCONATE CLOTH 2 % EX PADS
6.0000 | MEDICATED_PAD | Freq: Every day | CUTANEOUS | Status: DC
  Administered 2012-04-09 – 2012-04-12 (×4): 6 via TOPICAL

## 2012-04-08 MED ORDER — ALPRAZOLAM 0.5 MG PO TABS
0.5000 mg | ORAL_TABLET | Freq: Four times a day (QID) | ORAL | Status: DC | PRN
  Administered 2012-04-08 – 2012-04-13 (×12): 0.5 mg via ORAL
  Filled 2012-04-08 (×12): qty 1

## 2012-04-08 MED ORDER — CEFAZOLIN SODIUM-DEXTROSE 2-3 GM-% IV SOLR
2.0000 g | INTRAVENOUS | Status: DC
  Administered 2012-04-09: 2 g via INTRAVENOUS
  Filled 2012-04-08: qty 50

## 2012-04-08 MED ORDER — ACETAMINOPHEN 650 MG RE SUPP: 650.0000 mg | Freq: Four times a day (QID) | RECTAL | Status: DC | PRN

## 2012-04-08 MED ORDER — PANTOPRAZOLE SODIUM 40 MG IV SOLR
40.0000 mg | Freq: Two times a day (BID) | INTRAVENOUS | Status: DC
  Administered 2012-04-08 – 2012-04-09 (×3): 40 mg via INTRAVENOUS
  Filled 2012-04-08 (×4): qty 40

## 2012-04-08 NOTE — ED Notes (Signed)
Patient continues to receive blood products. Now being transferred to unit 2900.

## 2012-04-08 NOTE — ED Provider Notes (Signed)
Medical screening examination/treatment/procedure(s) were performed by non-physician practitioner and as supervising physician I was immediately available for consultation/collaboration.  Sunnie Nielsen, MD 04/08/12 [phone]

## 2012-04-08 NOTE — Consult Note (Signed)
Reason for Consult: Loss of consciousness and weakness in pt with CAD and A. Fib on coumadin with hgb 7.3 on admit  Referring Physician: [facility]   [name] is an 71 y.o. female.    Chief Complaint:  Loss of consciousness and weakness   HPI: 71 year-old female with history of CAD status post CABG, atrial fibrillation on Coumadin, COPD over the last few days has been experiencing weakness dizziness and has had at least 3 episodes of loss of consciousness. Last evening around 5 PM she had a third episode when she tried to move she fell and lost consciousness. After the episode she was weak, also had some chest tightness with mild shortness of breath. Since the symptoms persisted she came to the ER. In the ER patient was initially mildly hypotensive and was given fluids. Hemoglobin came back as 7.3 had dropped from 12 in June of this year. Patient stated that she usually has dark stools and thinks it's from having grape drink. Denies having used any NSAIDs. Her INR was therapeutic. Stool for occult blood is positive. At this time patient has been started on transfusion of PRBC and FFP and vitamin K is already ordered. Patient was admitted to step down unit for close observation.   No further chest pain, now SOB with wheezes this AM.   She has chronic A. Fib.  EKG with ischemic changes in lead I, troponin marker negative X 1.  She has had occ chest pain at home but has not needed NTG.  Awaiting office records for last nuc study.  She is/was for total Lt. Knee next week.    She did undergo Myoview stress test in July of 2013 and this was negative for ischemia.  She has been considered low risk for surgery.  Her CABG was in 1997.  She also has hyperlipidemia.  Cath in 2011 revealed patent grafts and normal EF.  Past Medical History  Diagnosis Date  . Chronic airway obstruction, not elsewhere classified   . Coronary atherosclerosis of unspecified type of vessel, native or graft   .  Atrial fibrillation   . Unspecified venous (peripheral) insufficiency   . Other and unspecified hyperlipidemia   . Morbid obesity   . Esophageal reflux   . Irritable bowel syndrome   . 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 Other symptoms involving urinary system   . Chronic pain syndrome   . 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 Anxiety state, unspecified   . Herpes zoster without mention of complication   . Herpes zoster with other nervous system complications   . Myalgia and myositis, unspecified   . Lumbago     Past Surgical History  Procedure Date  . Appendectomy   . Cholecystectomy   . Vesicovaginal fistula closure w/ tah   . Coronary artery bypass graft   . Cardiac catheterization     Family History  Problem Relation Age of Onset  . Heart disease Mother   . Lung disease Father   . Kidney disease     Social History:  reports that she quit smoking about 27 years ago. She has never used smokeless tobacco. She reports that she drinks alcohol. She reports that she does not use illicit drugs.Married  Allergies: No Known Allergies  Medications Prior to Admission  Medication Sig Dispense Refill  . albuterol (PROVENTIL) (2.5 MG/3ML) 0.083% nebulizer solution Take 2.5 mg by nebulization every 6 (six) hours as needed. For  shortness of breath      . ALPRAZolam (XANAX) 0.5 MG tablet Take 1 tablet (0.5 mg total) by mouth 4 (four) times daily as needed for anxiety.  120 tablet  5  . amitriptyline (ELAVIL) 50 MG tablet Take 2 tablets (100 mg total) by mouth at bedtime.  180 tablet  0  . Calcium Carbonate-Vitamin D (CALCIUM-VITAMIN D) 500-200 MG-UNIT per tablet Take 1 tablet by mouth 2 (two) times daily with a meal.      . cyclobenzaprine (FLEXERIL) 10 MG tablet Take 20 mg by mouth 3 (three) times daily as needed. For muscle spasms      . furosemide (LASIX) 40 MG tablet Take 40 mg by mouth daily.      Marland Kitchen gabapentin (NEURONTIN) 600 MG tablet  Take 1 tablet (600 mg total) by mouth 4 (four) times daily.  360 tablet  0  . guaiFENesin (MUCINEX) 600 MG 12 hr tablet Take 600 mg by mouth 2 (two) times daily.       . isosorbide mononitrate (IMDUR) 30 MG 24 hr tablet Take 30 mg by mouth at bedtime.       . meclizine (ANTIVERT) 25 MG tablet Take 1 by mouth every 4-6 hours as needed for dizziness  50 tablet  0  . metoprolol (LOPRESSOR) 50 MG tablet Take 1 tablet (50 mg total) by mouth 2 (two) times daily.  180 tablet  0  . Multiple Vitamin (MULTIVITAMIN WITH MINERALS) TABS Take 1 tablet by mouth daily.      Marland Kitchen omeprazole (PRILOSEC) 20 MG capsule Take 1 capsule (20 mg total) by mouth 2 (two) times daily.  180 capsule  0  . oxyCODONE-acetaminophen (PERCOCET) 5-325 MG per tablet Take 1 tablet by mouth every 4 (four) hours as needed. For pain      . potassium chloride SA (KLOR-CON M20) 20 MEQ tablet Take 1 tablet (20 mEq total) by mouth 2 (two) times daily.  180 tablet  0  . promethazine (PHENERGAN) 25 MG tablet Take 25 mg by mouth every 6 (six) hours as needed. For nausea      . simethicone (MYLICON) 80 MG chewable tablet Chew 80 mg by mouth every 6 (six) hours as needed. For upset stomach      . simvastatin (ZOCOR) 20 MG tablet Take 20 mg by mouth at bedtime.        . vitamin B-12 (CYANOCOBALAMIN) 1000 MCG tablet Take 1,000 mcg by mouth daily.        Marland Kitchen warfarin (COUMADIN) 5 MG tablet Take 2.5-5 mg by mouth See admin instructions. 5mg  Sunday, Tuesday, Thursday, and Saturday   2.5mg  all other days        Results for orders placed during the hospital encounter of 04/07/12 (from the past 48 hour(s))  CBC WITH DIFFERENTIAL     Status: Abnormal   Collection Time   04/07/12  9:58 PM      Component Value Range Comment   WBC 4.7  4.0 - 10.5 K/uL    RBC 2.50 (*) 3.87 - 5.11 MIL/uL    Hemoglobin 7.3 (*) 12.0 - 15.0 g/dL    HCT 16.1 (*) 09.6 - 46.0 %    MCV 95.6  78.0 - 100.0 fL    MCH 29.2  26.0 - 34.0 pg    MCHC 30.5  30.0 - 36.0 g/dL    RDW 04.5 (*)  40.9 - 15.5 %    Platelets 157  150 - 400 K/uL    Neutrophils Relative  66  43 - 77 %    Neutro Abs 3.1  1.7 - 7.7 K/uL    Lymphocytes Relative 23  12 - 46 %    Lymphs Abs 1.1  0.7 - 4.0 K/uL    Monocytes Relative 6  3 - 12 %    Monocytes Absolute 0.3  0.1 - 1.0 K/uL    Eosinophils Relative 5  0 - 5 %    Eosinophils Absolute 0.2  0.0 - 0.7 K/uL    Basophils Relative 1  0 - 1 %    Basophils Absolute 0.0  0.0 - 0.1 K/uL   COMPREHENSIVE METABOLIC PANEL     Status: Abnormal   Collection Time   04/07/12  9:58 PM      Component Value Range Comment   Sodium 133 (*) 135 - 145 mEq/L    Potassium 3.9  3.5 - 5.1 mEq/L    Chloride 95 (*) 96 - 112 mEq/L    CO2 29  19 - 32 mEq/L    Glucose, Bld 102 (*) 70 - 99 mg/dL    BUN 26 (*) 6 - 23 mg/dL    Creatinine, Ser 2.95 (*) 0.50 - 1.10 mg/dL    Calcium 9.1  8.4 - 62.1 mg/dL    Total Protein 7.6  6.0 - 8.3 g/dL    Albumin 3.9  3.5 - 5.2 g/dL    AST 18  0 - 37 U/L    ALT 9  0 - 35 U/L    Alkaline Phosphatase 140 (*) 39 - 117 U/L    Total Bilirubin 0.5  0.3 - 1.2 mg/dL    GFR calc non Af Amer 34 (*) >90 mL/min    GFR calc Af Amer 40 (*) >90 mL/min   POCT I-STAT TROPONIN I     Status: Normal   Collection Time   04/07/12 10:29 PM      Component Value Range Comment   Troponin i, poc 0.00  0.00 - 0.08 ng/mL    Comment 3            PROTIME-INR     Status: Abnormal   Collection Time   04/08/12 12:15 AM      Component Value Range Comment   Prothrombin Time 23.4 (*) 11.6 - 15.2 seconds    INR 2.19 (*) 0.00 - 1.49   HEMOGLOBIN AND HEMATOCRIT, BLOOD     Status: Abnormal   Collection Time   04/08/12  1:28 AM      Component Value Range Comment   Hemoglobin 7.4 (*) 12.0 - 15.0 g/dL    HCT 30.8 (*) 65.7 - 46.0 %   OCCULT BLOOD, POC DEVICE     Status: Normal   Collection Time   04/08/12  1:34 AM      Component Value Range Comment   Fecal Occult Bld POSITIVE     TYPE AND SCREEN     Status: Normal (Preliminary result)   Collection Time   04/08/12  1:40  AM      Component Value Range Comment   ABO/RH(D) O NEG      Antibody Screen NEG      Sample Expiration 04/11/2012      Unit Number [id number]      Blood Component Type RBC LR PHER1      Unit division 00      Status of Unit ISSUED      Transfusion Status OK TO TRANSFUSE      Crossmatch Result Compatible  Unit Number [id number]      Blood Component Type RED CELLS,LR      Unit division 00      Status of Unit ISSUED      Transfusion Status OK TO TRANSFUSE      Crossmatch Result Compatible     PREPARE RBC (CROSSMATCH)     Status: Normal   Collection Time   04/08/12  1:40 AM      Component Value Range Comment   Order Confirmation ORDER PROCESSED BY BLOOD BANK     ABO/RH     Status: Normal   Collection Time   04/08/12  1:40 AM      Component Value Range Comment   ABO/RH(D) O NEG     PREPARE FRESH FROZEN PLASMA     Status: Normal (Preliminary result)   Collection Time   04/08/12  3:06 AM      Component Value Range Comment   Unit Number [id number]      Blood Component Type THAWED PLASMA      Unit division 00      Status of Unit ALLOCATED      Transfusion Status OK TO TRANSFUSE      Unit Number [id number]      Blood Component Type THAWED PLASMA      Unit division 00      Status of Unit ALLOCATED      Transfusion Status OK TO TRANSFUSE      Dg Chest 2 View  04/07/2012  *RADIOLOGY REPORT*  Clinical Data: Chest pain.  CHEST - 2 VIEW  Comparison: Plain film chest 08/25/2011.  Findings: There is cardiomegaly and pulmonary vascular congestion. The patient is status post CABG.  No consolidative process, pneumothorax or effusion.  IMPRESSION: Cardiomegaly and vascular congestion.   Original Report Authenticated By: Bernadene Bell. D'ALESSIO, M.D.     ROS: General:no colds or fevers, no weight changes Skin:no rashes or ulcers HEENT:no blurred vision UV:OZDGUYQI chest pain, but NTG was not needed PUL:+ SOB last pm and this am  HK:VQQV stools off and on GU:no hematuria, no  dysuria ZD:GLOV total Lt. Knee replacement, due to arthritis  Neuro:Syncope X 3 and numerous episodes of dizziness on prior to admit  Endo:no diabetes, no thyroid disease   Blood pressure 139/66, pulse 110, temperature 98.6 F (37 C), temperature source Oral, resp. rate 20, height 5\' 8"  (1.727 m), weight 115 kg (253 lb 8.5 oz), SpO2 96.00%. PE: General:alert and oriented, MAE, pleasant affect Skin:Warm and dry, brisk capillary refill HEENT:normocephalic, sclarea clear  Neck:supple, no JVD, no bruits  Heart:S1S2 irreg irreg, no MGRC Lungs:+ exp wheezes FIE:PPIRJ, soft, non tender Ext:no edema Neuro:alert and oriented X 3 MAE, follows commands    Assessment/Plan Principal Problem:  *GI bleed Active Problems:  Atrial fibrillation  Anemia  CAD (coronary artery disease)  PLAN:AM labs, pending.  Will recheck EKG, has rec'd blood.  BP improved. HR improved down from 140.  Coumadin on hold.  Followed by Dr. Arlyce Dice with [facility]  INGOLD,LAURA R 04/08/2012, 7:43 AM     Patient seen and examined. Agree with assessment and plan. Pt admitted with GI bleed with HB drop to 7.3. She is 17 yrs s/p CABG; most recent myoview scan was normal on 01/19/12. ECG on admission shows nondiagnostic inferolateral ST changes probably contributed by anemia. Initial cardiac markers negative. Pt now has mild expiratory wheezing post 2 units PRBC TX; will give IV Lasix now. She is NPO for probable GI procedures today. Will change  oral metoprolol to 2.5 mg IV every 4 hours to avoid beta blocker withdrawal. Will follow.   Lennette Bihari, MD, [facility] 04/08/2012 8:28 AM

## 2012-04-08 NOTE — Progress Notes (Addendum)
[name] Progress Note [place] TEAM 1 - Stepdown/ICU TEAM   [name] ZOX:[id number] DOB: 12-02-1940 DOA: 04/07/2012 PCP: Michele Mcalpine, MD  Brief narrative: 71 year-old female with history of CAD status post CABG, atrial fibrillation on Coumadin, COPD, over the last few days had been experiencing weakness dizziness and has had at least 3 episodes of loss of consciousness. The evening before her admit around 5 PM she had another episode when she tried to move she fell and lost consciousness. Afterwards the patient was weak and also had some chest tightness with mild shortness of breath. Since the symptoms persisted she came to the ER. In the ER patient was initially mildly hypotensive and was given fluids. Hemoglobin came back as 7.3 - dropped from 12 in June this year. Patient states that she usually has dark stools and thinks it's from having grape drink. Denies having used any NSAIDs. Her INR was therapeutic. Stool for occult blood was positive. Patient was been started on transfusion of PRBC and FFP and vitamin K.  Assessment/Plan:  Acute vs/ chronic GIB  Acute blood loss anemia  Chronic afib on anticoagulation  Coumadin induced coagulopathy  CAD s/p CABG  COPD  Chronic kidney disease Baseline crt appears to be 1.3-1.5  MRSA + Follow usual contact precautions  Code Status: Full Disposition Plan: remain in SDU  Consultants: Cardiology GI  Procedures: none  Antibiotics: none  DVT prophylaxis: SCDs only due to active bleeding  HPI/Subjective: Pt is seen for a f/u visit.   Objective: Blood pressure 149/64, pulse 110, temperature 98.3 F (36.8 C), temperature source Oral, resp. rate 20, height 5\' 8"  (1.727 m), weight 115 kg (253 lb 8.5 oz), SpO2 96.00%.  Intake/Output Summary (Last 24 hours) at 04/08/12 1047 Last data filed at 04/08/12 0800  Gross per 24 hour  Intake    838 ml  Output    500 ml  Net    338 ml     Exam: F/U exam  completed  Data Reviewed: Basic Metabolic Panel:  Lab 04/08/12 8119 04/07/12 2158  NA 144 133*  K 4.0 3.9  CL 105 95*  CO2 29 29  GLUCOSE 91 102*  BUN 26* 26*  CREATININE 1.37* 1.50*  CALCIUM 9.0 9.1  MG 2.2 --  PHOS -- --   Liver Function Tests:  Lab 04/08/12 0745 04/07/12 2158  AST 16 18  ALT 7 9  ALKPHOS 116 140*  BILITOT 1.1 0.5  PROT 6.8 7.6  ALBUMIN 3.8 3.9   CBC:  Lab 04/08/12 0745 04/08/12 0128 04/07/12 2158  WBC 6.0 -- 4.7  NEUTROABS -- -- 3.1  HGB 9.2* 7.4* 7.3*  HCT 29.1* 23.9* 23.9*  MCV 93.0 -- 95.6  PLT 143* -- 157   Cardiac Enzymes:  Lab 04/08/12 0745  CKTOTAL --  CKMB --  CKMBINDEX --  TROPONINI <0.30   BNP (last 3 results)  Basename 12/22/11 1233  PROBNP 179.0*    Recent Results (from the past 240 hour(s))  MRSA PCR SCREENING     Status: Abnormal   Collection Time   04/08/12  5:54 AM      Component Value Range Status Comment   MRSA by PCR POSITIVE (*) NEGATIVE Final      Studies:  Recent x-ray studies have been reviewed in detail by the Attending Physician  Scheduled Meds:  Reviewed in detail by the Attending Physician   Lonia Blood, MD [name] Office  [phone] Pager [phone]  On-Call/Text Page:  [email or web address]      password [email or web address]  If 7PM-7AM, please contact night-coverage [email or web address] Password [email or web address] 04/08/2012, 10:47 AM   LOS: 1 day

## 2012-04-08 NOTE — ED Notes (Signed)
Patient is now receiving the 2nd unit of PRBC's. No complaints at this time.

## 2012-04-08 NOTE — H&P (Signed)
[name] is an 71 y.o. female. Patient was seen and examined on April 08, 2012 at 3:30 AM. PCP - Dr. Olean Ree.   Chief Complaint: Loss of consciousness and weakness. HPI: 71 year-old female with history of CAD status post CABG, atrial fibrillation on Coumadin, COPD over the last few days has been experiencing weakness dizziness and has had at least 3 episodes of loss of consciousness. Last evening around 5 PM she had a third episode when she tried to move she fell and lost consciousness. After the patient was weak she also has some chest tightness with mild shortness of breath. Since the symptoms persisted she came to the ER. In the ER patient was initially mildly hypotensive and was given fluids. Hemoglobin came back as 7.3 had dropped from 12 in June this year. Patient states that she usually has dark stools and thinks it's from having grape drink. Denies having used any NSAIDs. Her INR was therapeutic. Stool for occult blood is positive. At this time patient has been started on transfusion of PRBC and FFP and vitamin K is already ordered. Patient will be admitted to step down unit for close observation.  Past Medical History  Diagnosis Date  . Chronic airway obstruction, not elsewhere classified   . Coronary atherosclerosis of unspecified type of vessel, native or graft   . Atrial fibrillation   . Unspecified venous (peripheral) insufficiency   . Other and unspecified hyperlipidemia   . Morbid obesity   . Esophageal reflux   . Irritable bowel syndrome   . 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 Other symptoms involving urinary system   . Chronic pain syndrome   . 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 Anxiety state, unspecified   . Herpes zoster without mention of complication   . Herpes zoster with other nervous system complications   . Myalgia and myositis, unspecified   . Lumbago     Past Surgical History  Procedure Date  .  Appendectomy   . Cholecystectomy   . Vesicovaginal fistula closure w/ tah   . Coronary artery bypass graft     Family History  Problem Relation Age of Onset  . Heart disease Mother   . Lung disease Father   . Kidney disease     Social History:  reports that she quit smoking about 27 years ago. She has never used smokeless tobacco. She reports that she drinks alcohol. She reports that she does not use illicit drugs.  Allergies: No Known Allergies   (Not in a hospital admission)  Results for orders placed during the hospital encounter of 04/07/12 (from the past 48 hour(s))  CBC WITH DIFFERENTIAL     Status: Abnormal   Collection Time   04/07/12  9:58 PM      Component Value Range Comment   WBC 4.7  4.0 - 10.5 K/uL    RBC 2.50 (*) 3.87 - 5.11 MIL/uL    Hemoglobin 7.3 (*) 12.0 - 15.0 g/dL    HCT 47.8 (*) 29.5 - 46.0 %    MCV 95.6  78.0 - 100.0 fL    MCH 29.2  26.0 - 34.0 pg    MCHC 30.5  30.0 - 36.0 g/dL    RDW 62.1 (*) 30.8 - 15.5 %    Platelets 157  150 - 400 K/uL    Neutrophils Relative 66  43 - 77 %    Neutro Abs 3.1  1.7 - 7.7 K/uL  Lymphocytes Relative 23  12 - 46 %    Lymphs Abs 1.1  0.7 - 4.0 K/uL    Monocytes Relative 6  3 - 12 %    Monocytes Absolute 0.3  0.1 - 1.0 K/uL    Eosinophils Relative 5  0 - 5 %    Eosinophils Absolute 0.2  0.0 - 0.7 K/uL    Basophils Relative 1  0 - 1 %    Basophils Absolute 0.0  0.0 - 0.1 K/uL   COMPREHENSIVE METABOLIC PANEL     Status: Abnormal   Collection Time   04/07/12  9:58 PM      Component Value Range Comment   Sodium 133 (*) 135 - 145 mEq/L    Potassium 3.9  3.5 - 5.1 mEq/L    Chloride 95 (*) 96 - 112 mEq/L    CO2 29  19 - 32 mEq/L    Glucose, Bld 102 (*) 70 - 99 mg/dL    BUN 26 (*) 6 - 23 mg/dL    Creatinine, Ser 4.09 (*) 0.50 - 1.10 mg/dL    Calcium 9.1  8.4 - 81.1 mg/dL    Total Protein 7.6  6.0 - 8.3 g/dL    Albumin 3.9  3.5 - 5.2 g/dL    AST 18  0 - 37 U/L    ALT 9  0 - 35 U/L    Alkaline Phosphatase 140 (*) 39  - 117 U/L    Total Bilirubin 0.5  0.3 - 1.2 mg/dL    GFR calc non Af Amer 34 (*) >90 mL/min    GFR calc Af Amer 40 (*) >90 mL/min   POCT I-STAT TROPONIN I     Status: Normal   Collection Time   04/07/12 10:29 PM      Component Value Range Comment   Troponin i, poc 0.00  0.00 - 0.08 ng/mL    Comment 3            PROTIME-INR     Status: Abnormal   Collection Time   04/08/12 12:15 AM      Component Value Range Comment   Prothrombin Time 23.4 (*) 11.6 - 15.2 seconds    INR 2.19 (*) 0.00 - 1.49   HEMOGLOBIN AND HEMATOCRIT, BLOOD     Status: Abnormal   Collection Time   04/08/12  1:28 AM      Component Value Range Comment   Hemoglobin 7.4 (*) 12.0 - 15.0 g/dL    HCT 91.4 (*) 78.2 - 46.0 %   OCCULT BLOOD, POC DEVICE     Status: Normal   Collection Time   04/08/12  1:34 AM      Component Value Range Comment   Fecal Occult Bld POSITIVE     TYPE AND SCREEN     Status: Normal (Preliminary result)   Collection Time   04/08/12  1:40 AM      Component Value Range Comment   ABO/RH(D) O NEG      Antibody Screen NEG      Sample Expiration 04/11/2012      Unit Number [id number]      Blood Component Type RBC LR PHER1      Unit division 00      Status of Unit ALLOCATED      Transfusion Status OK TO TRANSFUSE      Crossmatch Result Compatible      Unit Number [id number]      Blood Component Type RED CELLS,LR  Unit division 00      Status of Unit ISSUED      Transfusion Status OK TO TRANSFUSE      Crossmatch Result Compatible     PREPARE RBC (CROSSMATCH)     Status: Normal   Collection Time   04/08/12  1:40 AM      Component Value Range Comment   Order Confirmation ORDER PROCESSED BY BLOOD BANK     ABO/RH     Status: Normal (Preliminary result)   Collection Time   04/08/12  1:40 AM      Component Value Range Comment   ABO/RH(D) O NEG     PREPARE FRESH FROZEN PLASMA     Status: Normal (Preliminary result)   Collection Time   04/08/12  3:06 AM      Component Value Range Comment     Unit Number [id number]      Blood Component Type THAWED PLASMA      Unit division 00      Status of Unit ALLOCATED      Transfusion Status OK TO TRANSFUSE      Unit Number [id number]      Blood Component Type THAWED PLASMA      Unit division 00      Status of Unit ALLOCATED      Transfusion Status OK TO TRANSFUSE      Dg Chest 2 View  04/07/2012  *RADIOLOGY REPORT*  Clinical Data: Chest pain.  CHEST - 2 VIEW  Comparison: Plain film chest 08/25/2011.  Findings: There is cardiomegaly and pulmonary vascular congestion. The patient is status post CABG.  No consolidative process, pneumothorax or effusion.  IMPRESSION: Cardiomegaly and vascular congestion.   Original Report Authenticated By: Bernadene Bell. Maricela Curet, M.D.     Review of Systems  Constitutional: Positive for malaise/fatigue.  HENT: Negative.   Eyes: Negative.   Respiratory: Positive for shortness of breath.   Cardiovascular: Positive for chest pain.  Gastrointestinal: Negative.   Genitourinary: Negative.   Musculoskeletal: Negative.   Skin: Negative.   Neurological: Positive for dizziness, loss of consciousness and weakness.  Endo/Heme/Allergies: Negative.   Psychiatric/Behavioral: Negative.     Blood pressure 111/77, pulse 102, temperature 97.9 F (36.6 C), temperature source Oral, resp. rate 16, SpO2 99.00%. Physical Exam  Constitutional: She is oriented to person, place, and time. She appears well-developed and well-nourished. No distress.  HENT:  Head: Normocephalic and atraumatic.  Right Ear: External ear normal.  Left Ear: External ear normal.  Nose: Nose normal.  Mouth/Throat: Oropharynx is clear and moist. No oropharyngeal exudate.  Eyes: Pupils are equal, round, and reactive to light. Right eye exhibits no discharge. Left eye exhibits no discharge. No scleral icterus.  Neck: Normal range of motion. Neck supple.  Cardiovascular:       Tachycardia with irregular rhythm.  Respiratory: Effort normal and  breath sounds normal. No respiratory distress. She has no wheezes. She has no rales.  GI: Soft. Bowel sounds are normal. She exhibits no distension. There is no tenderness. There is no rebound.  Musculoskeletal: Normal range of motion. She exhibits no edema and no tenderness.  Neurological: She is alert and oriented to person, place, and time.       Moves all extremities.  Skin: She is not diaphoretic. There is erythema.     Assessment/Plan #1. GI bleed probably acute - source not clear whether whether upper or lower. ER physician has already consult gastroenterologist Dr. Leone Payor will be seeing patient in consult. At  this time patient will be kept n.p.o. Continue with 2 units of packed red blood for transfusion and FFP transfusion. Vitamin K has been already given through IV. Recheck CBC and INR after transfusion. Protonix I.V. Further recommendations per gastroenterologist. #2. Acute blood loss anemia - see #1. #3. Atrial fibrillation - presently her rate increases whenever she tries to stand up. Continue with transfusion and closely observe rate in the monitor. May be increased due to the volume loss. Hold Coumadin due to acute GI bleed. #4. CAD status post CABG - patient did complain of some chest tightness earlier. EKG does show some suttle changes. Check cardiac enzymes. I have listed [facility] for cardiology consult. #5. COPD - presently not wheezing. I have placed patient on when necessary Xopenex. #6. Chronic kidney disease - follow metabolic panel closely.  CODE STATUS - full code.  Napoleon Monacelli N. 04/08/2012, 3:55 AM

## 2012-04-08 NOTE — Care Management Note (Signed)
    Page 1 of 1   04/08/2012     9:47:42 AM   CARE MANAGEMENT NOTE 04/08/2012  Patient:  [name], [name]   Account Number:  [id number]  Date Initiated:  04/08/2012  Documentation initiated by:  Junius Creamer  Subjective/Objective Assessment:   adm w syncope and anemia, hgb 7.3     Action/Plan:   lives w husband, pcp dr Lorin Picket nadel   Anticipated DC Date:     Anticipated DC Plan:        DC Planning Services  CM consult      Choice offered to / List presented to:             Status of service:   Medicare Important Message given?   (If response is "NO", the following Medicare IM given date fields will be blank) Date Medicare IM given:   Date Additional Medicare IM given:    Discharge Disposition:    Per UR Regulation:  Reviewed for med. necessity/level of care/duration of stay  If discussed at Long Length of Stay Meetings, dates discussed:    Comments:  9/20 9:45a debbie Jebediah Macrae rn,bns [phone]

## 2012-04-08 NOTE — ED Provider Notes (Signed)
History     CSN: [id number]  Arrival date & time 04/07/12  2141   First MD Initiated Contact with Patient 04/07/12 2349      Chief Complaint  Patient presents with  . Chest Pain    (Consider location/radiation/quality/duration/timing/severity/associated sxs/prior treatment) HPI History provided by pt.   Pt has been experiencing lightheadedness upon standing from seated position for the past 4 days.  She has had multiple falls as well as two syncopal episodes that were preceded by lightheadedness, weakness, feeling hot all over and nausea.  Had a single episode of central, non-radiating chest pressure w/ associated SOB while getting into her wheelchair this evening.  Sx lasted for approx 1 hour and are currently resolved.  Has not had any palpitations.  Denies recent fever, cough, vomiting, diarrhea, urinary sx, vaginal/rectal bleeding, LE pain/edema.  No recent medication changes.  She is on a diuretic.  Her husband reports polydipsia.  Did not hit her head in any of the falls.  Past Medical History  Diagnosis Date  . Chronic airway obstruction, not elsewhere classified   . Coronary atherosclerosis of unspecified type of vessel, native or graft   . Atrial fibrillation   . Unspecified venous (peripheral) insufficiency   . Other and unspecified hyperlipidemia   . Morbid obesity   . Esophageal reflux   . Irritable bowel syndrome   . 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 Other symptoms involving urinary system   . Chronic pain syndrome   . 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 Anxiety state, unspecified   . Herpes zoster without mention of complication   . Herpes zoster with other nervous system complications   . Myalgia and myositis, unspecified   . Lumbago     Past Surgical History  Procedure Date  . Appendectomy   . Cholecystectomy   . Vesicovaginal fistula closure w/ tah   . Coronary artery bypass graft     Family History    Problem Relation Age of Onset  . Heart disease Mother   . Lung disease Father   . Kidney disease      History  Substance Use Topics  . Smoking status: Former Smoker    Quit date: 07/20/1984  . Smokeless tobacco: Never Used  . Alcohol Use: Yes     social    OB History    Grav Para Term Preterm Abortions TAB SAB Ect Mult Living                  Review of Systems  All other systems reviewed and are negative.    Allergies  Review of patient's allergies indicates no known allergies.  Home Medications   Current Outpatient Rx  Name Route Sig Dispense Refill  . ALBUTEROL SULFATE (2.5 MG/3ML) 0.083% IN NEBU Nebulization Take 2.5 mg by nebulization every 6 (six) hours as needed. For shortness of breath    . ALPRAZOLAM 0.5 MG PO TABS Oral Take 1 tablet (0.5 mg total) by mouth 4 (four) times daily as needed for anxiety. 120 tablet 5  . AMITRIPTYLINE HCL 50 MG PO TABS Oral Take 2 tablets (100 mg total) by mouth at bedtime. 180 tablet 0  . CALCIUM-VITAMIN D 500-200 MG-UNIT PO TABS Oral Take 1 tablet by mouth 2 (two) times daily with a meal.    . CYCLOBENZAPRINE HCL 10 MG PO TABS Oral Take 20 mg by mouth 3 (three) times daily as needed. For muscle spasms    .  FUROSEMIDE 40 MG PO TABS Oral Take 40 mg by mouth daily.    Marland Kitchen GABAPENTIN 600 MG PO TABS Oral Take 1 tablet (600 mg total) by mouth 4 (four) times daily. 360 tablet 0  . GUAIFENESIN ER 600 MG PO TB12 Oral Take 600 mg by mouth 2 (two) times daily.     . ISOSORBIDE MONONITRATE ER 30 MG PO TB24 Oral Take 30 mg by mouth at bedtime.     . MECLIZINE HCL 25 MG PO TABS  Take 1 by mouth every 4-6 hours as needed for dizziness 50 tablet 0  . METOPROLOL TARTRATE 50 MG PO TABS Oral Take 1 tablet (50 mg total) by mouth 2 (two) times daily. 180 tablet 0  . ADULT MULTIVITAMIN W/MINERALS CH Oral Take 1 tablet by mouth daily.    Marland Kitchen OMEPRAZOLE 20 MG PO CPDR Oral Take 1 capsule (20 mg total) by mouth 2 (two) times daily. 180 capsule 0  .  OXYCODONE-ACETAMINOPHEN 5-325 MG PO TABS Oral Take 1 tablet by mouth every 4 (four) hours as needed. For pain    . POTASSIUM CHLORIDE CRYS ER 20 MEQ PO TBCR Oral Take 1 tablet (20 mEq total) by mouth 2 (two) times daily. 180 tablet 0  . PROMETHAZINE HCL 25 MG PO TABS Oral Take 25 mg by mouth every 6 (six) hours as needed. For nausea    . SIMETHICONE 80 MG PO CHEW Oral Chew 80 mg by mouth every 6 (six) hours as needed. For upset stomach    . SIMVASTATIN 20 MG PO TABS Oral Take 20 mg by mouth at bedtime.      Marland Kitchen VITAMIN B-12 1000 MCG PO TABS Oral Take 1,000 mcg by mouth daily.      . WARFARIN SODIUM 5 MG PO TABS Oral Take 2.5-5 mg by mouth See admin instructions. 5mg  Sunday, Tuesday, Thursday, and Saturday   2.5mg  all other days      BP 103/55  Pulse 101  Temp 97.9 F (36.6 C) (Oral)  Resp 22  SpO2 100%  Physical Exam  Nursing note and vitals reviewed. Constitutional: She is oriented to person, place, and time. She appears well-developed and well-nourished. No distress.  HENT:  Head: Normocephalic and atraumatic.       Dry mucous membranes  Eyes:       Pale conjunctiva  Neck: Normal range of motion.  Cardiovascular: Normal rate, regular rhythm and intact distal pulses.        Mildly hypotensive at 93/51.   Pulmonary/Chest: Effort normal and breath sounds normal. No respiratory distress.       Mild, diffuse tenderness that patient reports is chronic  Abdominal: Soft. Bowel sounds are normal. She exhibits no distension. There is no tenderness. There is no guarding.  Musculoskeletal: Normal range of motion.       No peripheral edema.  Mild bilateral calf tenderness.  Ecchymosis R anterior knee  Neurological: She is alert and oriented to person, place, and time.  Skin: Skin is warm and dry. No rash noted.  Psychiatric: She has a normal mood and affect. Her behavior is normal.    ED Course  Procedures (including critical care time)   Date: 04/08/2012  Rate: *83  Rhythm: atrial  fibrillation  QRS Axis: normal  Intervals: normal  ST/T Wave abnormalities: nonspecific T wave changes  Conduction Disutrbances:none  Narrative Interpretation:   Old EKG Reviewed: changes noted  (diffuse T wave flattening)   Labs Reviewed  CBC WITH DIFFERENTIAL - Abnormal;  Notable for the following:    RBC 2.50 (*)     Hemoglobin 7.3 (*)     HCT 23.9 (*)     RDW 17.7 (*)     All other components within normal limits  COMPREHENSIVE METABOLIC PANEL - Abnormal; Notable for the following:    Sodium 133 (*)     Chloride 95 (*)     Glucose, Bld 102 (*)     BUN 26 (*)     Creatinine, Ser 1.50 (*)     Alkaline Phosphatase 140 (*)     GFR calc non Af Amer 34 (*)     GFR calc Af Amer 40 (*)     All other components within normal limits  POCT I-STAT TROPONIN I  PROTIME-INR   Dg Chest 2 View  04/07/2012  *RADIOLOGY REPORT*  Clinical Data: Chest pain.  CHEST - 2 VIEW  Comparison: Plain film chest 08/25/2011.  Findings: There is cardiomegaly and pulmonary vascular congestion. The patient is status post CABG.  No consolidative process, pneumothorax or effusion.  IMPRESSION: Cardiomegaly and vascular congestion.   Original Report Authenticated By: Bernadene Bell. D'ALESSIO, M.D.      1. Syncope   2. Anemia   3. Chest pain   4. Hypotension   5. GI bleed   6. Atrial fibrillation   7. CAD (coronary artery disease)       MDM  71yo F on coumadin for atrial fibrillation and h/o CAD presents w/ c/o syncope as well as single episode of CP/SOB this evening.  On exam, pt dehydrated and mildly hypotensive, pale conjunctiva, heart w/ RRR.  EKG w/ non-specific T wave changes.  Labs sig for  subtherapeutic INR, neg troponin and anemia (hgb 7.3) w/ positive hemoccult.  CXR neg.  Syncope likely d/t anemia secondary to acute blood loss in GI tract.  Chest pain may be ischemic secondary to anemia.  Pt type and crossed for 2 units pRBCs.  [facility] consulted for admission and requests coumadin reversal w/ FFP and  Vitamin K.  GI consulted as well.  Pt currently stable and is aware of all test results.          Otilio Miu, [place] 04/08/12 [phone]

## 2012-04-08 NOTE — Progress Notes (Signed)
Gastroenterology Consult: 11:18 AM 04/08/2012   Referring Provider:  Dr Sharon Seller Primary Care Physician:  Michele Mcalpine, MD Primary Gastroenterologist:  Dr. Melvia Heaps   Reason for Consultation:  Acute anemia, blood on FOB testing.   HPI: [name] is a 71 y.o. female.  Morbidly obese with emphysema, osteoarthritis and DDD, chronic Coumadin for Afib, s/p CABG, s/p remote Nissen fundoplication.  Has scheduled left TKR set up for next Friday. Since this past weekend has been having progressive weakness, dizzyness.  Fell 3 times, onto knees and right shoulder.  Passed out 2 times.  Then was having trouble with cough and SOB.  Came to ED yesterday.  Hgb was 7.3, compared to 13 in June and 12.3 in February 2013. INR therapeutic at 2.1.  BUN is 26, compared to 21 in June.  She has received vitamin k, FFP: first of 2 units currently transfusing, 2 units PRBCs.  Hgb has risen to 9.2.  Was bolused with one liter NS, and fluids currently running at 225 ml/hr along with FFP at 225/hour.  Dr Tresa Endo has ordered lasix.  Her initial CXR showed vasc congestion and CM.  She is now c/o worsening SOB.  As for GI ROS:  She has constipation that is well controlled with stool softeners and miralax.  Does not take NSAIDs except daily 81 mg ASA.  She has no nausea, anorexia, weight loss, dysphagia.  She has not seen any dark or bloody stools.      Past Medical History  Diagnosis Date  . Chronic airway obstruction, not elsewhere classified   . Coronary atherosclerosis of unspecified type of vessel, native or graft   . Atrial fibrillation   . Unspecified venous (peripheral) insufficiency   . Other and unspecified hyperlipidemia   . Morbid obesity   . Esophageal reflux   . Irritable bowel syndrome   . 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 Other symptoms involving urinary system   . Chronic pain syndrome   . Osteitis deformans  without mention of bone tumor   . Anxiety state, unspecified   . Herpes zoster without mention of complication   . Herpes zoster with other nervous system complications   . Myalgia and myositis, unspecified   . Lumbago   . Anticoagulated on Coumadin, chronic for A. Fib 04/08/2012    Past Surgical History  Procedure Date  . Appendectomy   . Cholecystectomy   . Vesicovaginal fistula closure w/ tah   . Coronary artery bypass graft   . Cardiac catheterization   . Nissen fundoplication     Prior to Admission medications   Medication Sig Start Date End Date Taking? Authorizing Provider  albuterol (PROVENTIL) (2.5 MG/3ML) 0.083% nebulizer solution Take 2.5 mg by nebulization every 6 (six) hours as needed. For shortness of breath   Yes Historical Provider, MD  ALPRAZolam (XANAX) 0.5 MG tablet Take 1 tablet (0.5 mg total) by mouth 4 (four) times daily as needed for anxiety. 01/05/12 01/04/13 Yes Michele Mcalpine, MD  amitriptyline (ELAVIL) 50 MG tablet Take 2 tablets (100 mg total) by mouth at bedtime. 10/23/11 10/22/12 Yes Michele Mcalpine, MD  Calcium Carbonate-Vitamin D (CALCIUM-VITAMIN D) 500-200 MG-UNIT per tablet Take 1 tablet by mouth 2 (two) times daily with a meal.   Yes Historical Provider, MD  cyclobenzaprine (FLEXERIL) 10 MG tablet Take 20 mg by mouth 3 (three) times daily as needed. For muscle spasms   Yes Historical Provider, MD  furosemide (LASIX) 40 MG  tablet Take 40 mg by mouth daily.   Yes Historical Provider, MD  gabapentin (NEURONTIN) 600 MG tablet Take 1 tablet (600 mg total) by mouth 4 (four) times daily. 10/23/11 10/22/12 Yes Michele Mcalpine, MD  guaiFENesin (MUCINEX) 600 MG 12 hr tablet Take 600 mg by mouth 2 (two) times daily.    Yes Historical Provider, MD  isosorbide mononitrate (IMDUR) 30 MG 24 hr tablet Take 30 mg by mouth at bedtime.    Yes Historical Provider, MD  meclizine (ANTIVERT) 25 MG tablet Take 1 by mouth every 4-6 hours as needed for dizziness 04/07/12 04/07/13 Yes Michele Mcalpine, MD  metoprolol (LOPRESSOR) 50 MG tablet Take 1 tablet (50 mg total) by mouth 2 (two) times daily. 10/23/11 10/22/12 Yes Michele Mcalpine, MD  Multiple Vitamin (MULTIVITAMIN WITH MINERALS) TABS Take 1 tablet by mouth daily.   Yes Historical Provider, MD  omeprazole (PRILOSEC) 20 MG capsule Take 1 capsule (20 mg total) by mouth 2 (two) times daily. 10/23/11 10/22/12 Yes Michele Mcalpine, MD  oxyCODONE-acetaminophen (PERCOCET) 5-325 MG per tablet Take 1 tablet by mouth every 4 (four) hours as needed. For pain   Yes Historical Provider, MD  potassium chloride SA (KLOR-CON M20) 20 MEQ tablet Take 1 tablet (20 mEq total) by mouth 2 (two) times daily. 10/23/11 10/22/12 Yes Michele Mcalpine, MD  promethazine (PHENERGAN) 25 MG tablet Take 25 mg by mouth every 6 (six) hours as needed. For nausea   Yes Historical Provider, MD  simethicone (MYLICON) 80 MG chewable tablet Chew 80 mg by mouth every 6 (six) hours as needed. For upset stomach   Yes Historical Provider, MD  simvastatin (ZOCOR) 20 MG tablet Take 20 mg by mouth at bedtime.     Yes Historical Provider, MD  vitamin B-12 (CYANOCOBALAMIN) 1000 MCG tablet Take 1,000 mcg by mouth daily.     Yes Historical Provider, MD  warfarin (COUMADIN) 5 MG tablet Take 2.5-5 mg by mouth See admin instructions. 5mg  Sunday, Tuesday, Thursday, and Saturday   2.5mg  all other days   Yes Historical Provider, MD    Scheduled Meds:    . furosemide  40 mg Intravenous Once  .  HYDROmorphone (DILAUDID) injection  0.5 mg Intravenous Once  . ipratropium  0.5 mg Nebulization Q6H  . levalbuterol  0.63 mg Nebulization Q6H  . metoprolol  2.5 mg Intravenous Q4H  . pantoprazole (PROTONIX) IV  40 mg Intravenous Q12H  . phytonadione (VITAMIN K) IV  10 mg Intravenous Once  . pneumococcal 23 valent vaccine  0.5 mL Intramuscular Tomorrow-1000  . sodium chloride  1,000 mL Intravenous Once   Infusions:    . sodium chloride 75 mL/hr (04/08/12 0655)   PRN Meds: acetaminophen, acetaminophen,  levalbuterol, LORazepam, morphine injection, ondansetron (ZOFRAN) IV, ondansetron,    Allergies as of 04/07/2012  . (No Known Allergies)    Family History  Problem Relation Age of Onset  . Heart disease Mother   . Lung disease Father   . Kidney disease      History   Social History  . Marital Status: Married    Spouse Name: Baldo Ash    Number of Children: N/A  . Years of Education: N/A   Occupational History  . Retired   .     Social History Main Topics  . Smoking status: Former Smoker    Quit date: 07/20/1984  . Smokeless tobacco: Never Used  . Alcohol Use: Yes     social  . Drug  Use: No  . Sexually Active: Not on file     REVIEW OF SYSTEMS: Constitutional:  weakness ENT:  No nose bleeds Pulm:  SOB, wheezing and tight cough.  Chronic tid nebulizers at home CV:   No chest pain, some palpitations GU:  No hematuria no dysuria GI:  As above Heme:  No unusual bleeding,  Some bruising at knees and shoulder from falls.    Transfusions:  Non in past per her recall Neuro:  No headache.  Syncopal at home PTA Derm:  No rash or sores Endocrine:  No excessive thirst or urination.  Periodic steroids for lungs: last was 2 to 3 months ago Immunization:  Pneumovax ordered.   PHYSICAL EXAM: Vital signs in last 24 hours: Temp:  [97.2 F (36.2 C)-98.8 F (37.1 C)] 98.3 F (36.8 C) (09/20 0800) Pulse Rate:  [67-141] 110  (09/20 0700) Resp:  [18-26] 20  (09/20 0551) BP: (90-149)/(50-81) 149/64 mmHg (09/20 0800) SpO2:  [86 %-100 %] 96 % (09/20 0645) Weight:  [253 lb 8.5 oz (115 kg)] 253 lb 8.5 oz (115 kg) (09/20 0600)  General: obese, unwell looking WF in some degree of resp distress.  Head:  Cushingoid faces  Eyes:  No icterus or conj pallor Ears:  Not HOH  Nose:  No bleeding or discharge Mouth:  Full set of dentures in [place, not removed.  Clear, moist oropharyngeal MM Neck:  No JVD or thyromegaly Lungs:  Audible wheezing.  Shallow respirations with increased resp  effort and dyspnea with speech.  Fine crackles in bases, but overall  Reduced BS. Heart: rapid into 120-140's when getting up to and off of commode, regular with occasional PVCs on monitor Abdomen:  Obese, ND, NT.  No bruits or masses.   Rectal: reddish brown soft stool.  Is rapidly 3 to 4 plus FOB+.   Musc/Skeltl: knees with arthritic deformity Extremities:  No pedal edema  Neurologic:  Pleasant, not confused. No tremor.  Moves all 4s.    Skin:  No rash or sores.  Pinpoint,blanchable telangectasia on palms.  Heme:  Bruising at knees B, right shoulder and flank:  Not extensive or large.  Tattoos:  none   Psych:  Pleasant, cooperative  Intake/Output from previous day: 09/19 0701 - 09/20 0700 In: 613 [I.V.:325; Blood:288] Out: 100 [Urine:100] Intake/Output this shift: Total I/O In: 225 [I.V.:225] Out: 400 [Urine:400]  LAB RESULTS:  Basename 04/08/12 0745 04/08/12 0128 04/07/12 2158  WBC 6.0 -- 4.7  HGB 9.2* 7.4* 7.3*  HCT 29.1* 23.9* 23.9*  PLT 143* -- 157   BMET Lab Results  Component Value Date   NA 144 04/08/2012   NA 133* 04/07/2012   NA 143 12/22/2011   K 4.0 04/08/2012   K 3.9 04/07/2012   K 5.3* 12/22/2011   CL 105 04/08/2012   CL 95* 04/07/2012   CL 102 12/22/2011   CO2 29 04/08/2012   CO2 29 04/07/2012   CO2 33* 12/22/2011   GLUCOSE 91 04/08/2012   GLUCOSE 102* 04/07/2012   GLUCOSE 94 12/22/2011   BUN 26* 04/08/2012   BUN 26* 04/07/2012   BUN 21 12/22/2011   CREATININE 1.37* 04/08/2012   CREATININE 1.50* 04/07/2012   CREATININE 1.7* 12/22/2011   CALCIUM 9.0 04/08/2012   CALCIUM 9.1 04/07/2012   CALCIUM 9.8 12/22/2011   LFT  Basename 04/08/12 0745 04/07/12 2158  PROT 6.8 7.6  ALBUMIN 3.8 3.9  AST 16 18  ALT 7 9  ALKPHOS 116 140*  BILITOT 1.1 0.5  BILIDIR -- --  IBILI -- --   PT/INR Lab Results  Component Value Date   INR 2.19* 04/08/2012   INR 1.84* 08/25/2011   INR 2.02* 01/08/2010   RADIOLOGY STUDIES: Dg Chest 2 View 04/07/2012  *RADIOLOGY REPORT*  Clinical Data:  Chest pain.  CHEST - 2 VIEW  Comparison: Plain film chest 08/25/2011.  Findings: There is cardiomegaly and pulmonary vascular congestion. The patient is status post CABG.  No consolidative process, pneumothorax or effusion.  IMPRESSION: Cardiomegaly and vascular congestion.   Original Report Authenticated By: Bernadene Bell. D'ALESSIO, M.D.     ENDOSCOPIC STUDIES: Colonoscopy in  2007 showed internal hemorrhoids.  Prep was poor.    IMPRESSION: *  Acute anemia, blood in stool on rectal exam.  Rule out ulcers, rule out AVMs, rule out portal htn bleeding from cirrhosis ( never been dx'd with this but body habitus makes me think of NASH...  LFTs are normal).  Suspect upper GI more than lower GI source. *  Chronic Coumadin. INR not supratherapeutic, but has gotten 10 of IV Vit K and will have gotten 2 of FFP by noon.  *  Remote Nissen fundoplication.  Takes daily Omeprazole.  *  Cough, Worsening SOB in setting of aggressive volume resuscitation.  Background of COPD, Asthma, possible acute bronchitis.  Suspect some CHF due to IVF.  On chronic nebulizers at home.  *  End stage left knee osteoarthritis, due for TKR in one week  PLAN: *  Start workup with EGD, but will delay this until her breathing has improved. Right now planning EGD for 9/21.   Hopefully correcting the INR will lessen whatever is bleeding.  I tentatively got a slot for a 9 AM EGD on 9/21, Dr Elnoria Howard is the on call GI doc, he will need to reassess her in the AM. *  Check a cbc and INR at 1300 today.  *  Continue the Protonix, 40 mg IV BID *  Sips of clears   LOS: 1 day   Jennye Moccasin  04/08/2012, 11:18 AM Pager: [phone]  GI ATTENDING  History, laboratories, x-rays, and prior endoscopy reports reviewed. Patient seen and examined. Agree with H&P as outlined above. She presents with several days of GI bleeding and syncope. Now with profound weakness and shortness of breath. Seems to be volume overloaded. Morbidly obese. Blood pressure okay.  INR still elevated. Physical exam reveals decreased breath sounds and crackles at the bases. Mild tachypnea. Morbid obesity. Vital signs are stable. Agree with plans to transfuse to appropriate hemoglobin. As well, correct abnormal coags. Administer PPI. Plans for endoscopy, hopefully tomorrow. If she becomes unstable from a GI standpoint and needs interval endoscopy, she may need endotracheal intubation if her respiratory status makes it unsafe to perform therapeutic endoscopy .Dr. Elnoria Howard on call for GI. Discussed with patient.  Wilhemina Bonito. Eda Keys., M.D. [facility] Division of Gastroenterology

## 2012-04-09 ENCOUNTER — Encounter (HOSPITAL_COMMUNITY)
Admission: EM | Disposition: A | Discharge status: Home / Self Care | Payer: Self-pay | Source: Home / Self Care | Attending: Internal Medicine

## 2012-04-09 ENCOUNTER — Encounter (HOSPITAL_COMMUNITY): Payer: Self-pay | Admitting: Cardiology

## 2012-04-09 DIAGNOSIS — Z5181 Encounter for therapeutic drug level monitoring: Secondary | ICD-10-CM

## 2012-04-09 DIAGNOSIS — Z7901 Long term (current) use of anticoagulants: Secondary | ICD-10-CM

## 2012-04-09 HISTORY — PX: ESOPHAGOGASTRODUODENOSCOPY: SHX5428

## 2012-04-09 LAB — COMPREHENSIVE METABOLIC PANEL
ALT: 7 U/L (ref 0–35)
AST: 18 U/L (ref 0–37)
Albumin: 3.9 g/dL (ref 3.5–5.2)
Alkaline Phosphatase: 110 U/L (ref 39–117)
BUN: 22 mg/dL (ref 6–23)
CO2: 28 mEq/L (ref 19–32)
Calcium: 9.3 mg/dL (ref 8.4–10.5)
Chloride: 98 mEq/L (ref 96–112)
Creatinine, Ser: 1.34 mg/dL — ABNORMAL HIGH (ref 0.50–1.10)
GFR calc Af Amer: 45 mL/min — ABNORMAL LOW (ref >90)
GFR calc non Af Amer: 39 mL/min — ABNORMAL LOW (ref >90)
Glucose, Bld: 99 mg/dL (ref 70–99)
Potassium: 3.6 mEq/L (ref 3.5–5.1)
Sodium: 138 mEq/L (ref 135–145)
Total Bilirubin: 1.6 mg/dL — ABNORMAL HIGH (ref 0.3–1.2)
Total Protein: 7.3 g/dL (ref 6.0–8.3)

## 2012-04-09 LAB — TYPE AND SCREEN
ABO/RH(D): O NEG
Unit division: 0

## 2012-04-09 LAB — GLUCOSE, CAPILLARY: Glucose-Capillary: 117 mg/dL — ABNORMAL HIGH (ref 70–99)

## 2012-04-09 LAB — PREPARE FRESH FROZEN PLASMA
Unit division: 0
Unit division: 0

## 2012-04-09 LAB — APTT: aPTT: 48 seconds — ABNORMAL HIGH (ref 24–37)

## 2012-04-09 LAB — CBC
HCT: 27.5 % — ABNORMAL LOW (ref 36.0–46.0)
MCH: 29.5 pg (ref 26.0–34.0)
MCV: 94.2 fL (ref 78.0–100.0)
RBC: 2.92 MIL/uL — ABNORMAL LOW (ref 3.87–5.11)
WBC: 5.3 10*3/uL (ref 4.0–10.5)

## 2012-04-09 LAB — TROPONIN I: Troponin I: <0.3 ng/mL (ref <0.30)

## 2012-04-09 LAB — PROTIME-INR
INR: 1.43 (ref 0.00–1.49)
Prothrombin Time: 17.1 seconds — ABNORMAL HIGH (ref 11.6–15.2)

## 2012-04-09 SURGERY — EGD (ESOPHAGOGASTRODUODENOSCOPY)
Anesthesia: Moderate Sedation

## 2012-04-09 MED ORDER — HYDRALAZINE HCL 20 MG/ML IJ SOLN
10.0000 mg | INTRAMUSCULAR | Status: DC | PRN
  Administered 2012-04-09: 10 mg via INTRAVENOUS
  Filled 2012-04-09: qty 1

## 2012-04-09 MED ORDER — METOPROLOL TARTRATE 1 MG/ML IV SOLN
2.5000 mg | Freq: Four times a day (QID) | INTRAVENOUS | Status: DC | PRN
  Filled 2012-04-09: qty 5

## 2012-04-09 MED ORDER — OXYCODONE HCL 5 MG PO TABS
5.0000 mg | ORAL_TABLET | ORAL | Status: DC | PRN
  Administered 2012-04-09: 10 mg via ORAL
  Administered 2012-04-09 – 2012-04-10 (×3): 5 mg via ORAL
  Administered 2012-04-11: 10 mg via ORAL
  Administered 2012-04-11: 5 mg via ORAL
  Administered 2012-04-12 – 2012-04-13 (×3): 10 mg via ORAL
  Filled 2012-04-09: qty 1
  Filled 2012-04-09 (×3): qty 2
  Filled 2012-04-09 (×2): qty 1
  Filled 2012-04-09 (×2): qty 2
  Filled 2012-04-09: qty 1
  Filled 2012-04-09: qty 2

## 2012-04-09 MED ORDER — HYDRALAZINE HCL 20 MG/ML IJ SOLN
INTRAMUSCULAR | Status: AC
  Filled 2012-04-09: qty 1

## 2012-04-09 MED ORDER — DIPHENHYDRAMINE HCL 50 MG/ML IJ SOLN
INTRAMUSCULAR | Status: AC
  Filled 2012-04-09: qty 1

## 2012-04-09 MED ORDER — SODIUM CHLORIDE 0.9 % IV SOLN: INTRAVENOUS | Status: DC

## 2012-04-09 MED ORDER — MIDAZOLAM HCL 5 MG/ML IJ SOLN
INTRAMUSCULAR | Status: AC
  Filled 2012-04-09: qty 2

## 2012-04-09 MED ORDER — MORPHINE SULFATE 2 MG/ML IJ SOLN: 2.0000 mg | INTRAMUSCULAR | Status: DC | PRN

## 2012-04-09 MED ORDER — PANTOPRAZOLE SODIUM 40 MG IV SOLR
40.0000 mg | INTRAVENOUS | Status: DC
  Filled 2012-04-09: qty 40

## 2012-04-09 MED ORDER — METOPROLOL TARTRATE 50 MG PO TABS
50.0000 mg | ORAL_TABLET | Freq: Two times a day (BID) | ORAL | Status: DC
  Administered 2012-04-09 – 2012-04-13 (×9): 50 mg via ORAL
  Filled 2012-04-09 (×13): qty 1

## 2012-04-09 MED ORDER — BUTAMBEN-TETRACAINE-BENZOCAINE 2-2-14 % EX AERO
INHALATION_SPRAY | CUTANEOUS | Status: DC | PRN
  Administered 2012-04-09: 2 via TOPICAL

## 2012-04-09 MED ORDER — PEG 3350-KCL-NA BICARB-NACL 420 G PO SOLR
4000.0000 mL | Freq: Once | ORAL | Status: AC
  Administered 2012-04-09: 4000 mL via ORAL
  Filled 2012-04-09: qty 4000

## 2012-04-09 MED ORDER — MIDAZOLAM HCL 5 MG/5ML IJ SOLN
INTRAMUSCULAR | Status: DC | PRN
  Administered 2012-04-09 (×2): 2 mg via INTRAVENOUS

## 2012-04-09 MED ORDER — FENTANYL CITRATE 0.05 MG/ML IJ SOLN
INTRAMUSCULAR | Status: AC
  Filled 2012-04-09: qty 4

## 2012-04-09 MED ORDER — ISOSORBIDE MONONITRATE ER 30 MG PO TB24
30.0000 mg | ORAL_TABLET | Freq: Every day | ORAL | Status: DC
  Administered 2012-04-09 – 2012-04-12 (×4): 30 mg via ORAL
  Filled 2012-04-09 (×6): qty 1

## 2012-04-09 MED ORDER — CYCLOBENZAPRINE HCL 10 MG PO TABS
20.0000 mg | ORAL_TABLET | Freq: Three times a day (TID) | ORAL | Status: DC | PRN
  Administered 2012-04-10 – 2012-04-12 (×3): 20 mg via ORAL
  Filled 2012-04-09 (×3): qty 2

## 2012-04-09 MED ORDER — MORPHINE SULFATE 2 MG/ML IJ SOLN: 1.0000 mg | INTRAMUSCULAR | Status: DC | PRN

## 2012-04-09 MED ORDER — GABAPENTIN 600 MG PO TABS
600.0000 mg | ORAL_TABLET | Freq: Four times a day (QID) | ORAL | Status: DC
  Administered 2012-04-09 – 2012-04-10 (×7): 600 mg via ORAL
  Filled 2012-04-09 (×12): qty 1

## 2012-04-09 MED ORDER — FENTANYL CITRATE 0.05 MG/ML IJ SOLN
INTRAMUSCULAR | Status: DC | PRN
  Administered 2012-04-09: 25 ug via INTRAVENOUS

## 2012-04-09 NOTE — Progress Notes (Signed)
EGD done at bedside in ICU.

## 2012-04-09 NOTE — Progress Notes (Signed)
[facility] DAILY PROGRESS NOTE  NAME:  [name]   MRN: [id number] DOB:  01/02/1941   ADMIT DATE: 04/07/2012   Patient Description   71 y.o. female with PMH below: presented with weakness and LOC -- found to be anemic on initial blood work.  She did noted some chest tightness & mild sob.  Troponins Neg x 3 & negative Myoview in 01/2012.  In Afib - got Vit K.   Past Medical History  Diagnosis Date  . COPD (chronic obstructive pulmonary disease)   . Coronary atherosclerosis of unspecified type of vessel, native or graft   . Atrial fibrillation   . Unspecified venous (peripheral) insufficiency   . Other and unspecified hyperlipidemia   . Morbid obesity   . Esophageal reflux   . Irritable bowel syndrome   . 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 Other symptoms involving urinary system   . Chronic pain syndrome   . 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 Anxiety state, unspecified   . Herpes zoster without mention of complication   . Herpes zoster with other nervous system complications   . Myalgia and myositis, unspecified   . Lumbago   . Anticoagulated on Coumadin, chronic for A. Fib 04/08/2012    Length of Stay:  LOS: 2 days    Subjective:   Today [name] feels better --just a headache & her knee hurts.  Breathing fine & no further CP>  Objective:  Temp:  [98.3 F (36.8 C)-98.8 F (37.1 C)] 98.3 F (36.8 C) (09/21 0700) Pulse Rate:  [80-132] 100  (09/21 0234) Resp:  [20] 20  (09/21 0234) BP: (37-154)/(24-92) 154/80 mmHg (09/21 0334) SpO2:  [85 %-99 %] 97 % (09/21 0334) Weight:  [113.6 kg (250 lb 7.1 oz)] 113.6 kg (250 lb 7.1 oz) (09/21 0500) Weight change: -1.4 kg (-3 lb 1.4 oz) Physical Exam: General appearance: alert, cooperative, appears stated age, no distress and morbidly obese Neck: no adenopathy, no carotid bruit, no JVD, supple, symmetrical, trachea midline and thyroid not  enlarged, symmetric, no tenderness/mass/nodules Lungs: diminished breath sounds bibasilar and mild bibasilar crackles.  Pre-breathing Rx. Heart: irregularly irregular rhythm and tachycardic, normal S1 S2 Abdomen: morbidly obese, soft, NT, ND, NABS Extremities: no notable edema, SCDs in place Neurologic: Grossly normal  Intake/Output from previous day: 09/20 0701 - 09/21 0700 In: 2449 [P.O.:840; I.V.:980; Blood:629] Out: 2730 [Urine:2730]  Intake/Output Summary (Last 24 hours) at 04/09/12 0809 Last data filed at 04/09/12 0050  Gross per 24 hour  Intake   2224 ml  Output   2330 ml  Net   -106 ml    Results for orders placed during the hospital encounter of 04/07/12 (from the past 24 hour(s))  CK TOTAL AND CKMB     Status: Normal   Collection Time   04/08/12  3:24 PM      Component Value Range   Total CK 28  7 - 177 U/L   CK, MB 1.7  0.3 - 4.0 ng/mL   Relative Index RELATIVE INDEX IS INVALID  0.0 - 2.5  TROPONIN I     Status: Normal   Collection Time   04/08/12  3:25 PM      Component Value Range   Troponin I <0.30  <0.30 ng/mL  PROTIME-INR     Status: Abnormal   Collection Time   04/08/12  3:25 PM      Component Value Range     Prothrombin Time 17.1 (*) 11.6 - 15.2 seconds   INR 1.43  0.00 - 1.49  CBC     Status: Abnormal   Collection Time   04/08/12  3:25 PM      Component Value Range   WBC 5.8  4.0 - 10.5 K/uL   RBC 2.84 (*) 3.87 - 5.11 MIL/uL   Hemoglobin 8.5 (*) 12.0 - 15.0 g/dL   HCT 26.4 (*) 36.0 - 46.0 %   MCV 93.0  78.0 - 100.0 fL   MCH 29.9  26.0 - 34.0 pg   MCHC 32.2  30.0 - 36.0 g/dL   RDW 17.2 (*) 11.5 - 15.5 %   Platelets 135 (*) 150 - 400 K/uL  GLUCOSE, CAPILLARY     Status: Abnormal   Collection Time   04/08/12  7:34 PM      Component Value Range   Glucose-Capillary 108 (*) 70 - 99 mg/dL  TROPONIN I     Status: Normal   Collection Time   04/08/12  8:20 PM      Component Value Range   Troponin I <0.30  <0.30 ng/mL  GLUCOSE, CAPILLARY     Status:  Abnormal   Collection Time   04/08/12 11:17 PM      Component Value Range   Glucose-Capillary 117 (*) 70 - 99 mg/dL  TROPONIN I     Status: Normal   Collection Time   04/09/12  2:15 AM      Component Value Range   Troponin I <0.30  <0.30 ng/mL  GLUCOSE, CAPILLARY     Status: Abnormal   Collection Time   04/09/12  3:51 AM      Component Value Range   Glucose-Capillary 105 (*) 70 - 99 mg/dL  CBC     Status: Abnormal   Collection Time   04/09/12  4:50 AM      Component Value Range   WBC 5.3  4.0 - 10.5 K/uL   RBC 2.92 (*) 3.87 - 5.11 MIL/uL   Hemoglobin 8.6 (*) 12.0 - 15.0 g/dL   HCT 27.5 (*) 36.0 - 46.0 %   MCV 94.2  78.0 - 100.0 fL   MCH 29.5  26.0 - 34.0 pg   MCHC 31.3  30.0 - 36.0 g/dL   RDW 17.3 (*) 11.5 - 15.5 %   Platelets 133 (*) 150 - 400 K/uL  COMPREHENSIVE METABOLIC PANEL     Status: Abnormal   Collection Time   04/09/12  4:50 AM      Component Value Range   Sodium 138  135 - 145 mEq/L   Potassium 3.6  3.5 - 5.1 mEq/L   Chloride 98  96 - 112 mEq/L   CO2 28  19 - 32 mEq/L   Glucose, Bld 99  70 - 99 mg/dL   BUN 22  6 - 23 mg/dL   Creatinine, Ser 1.34 (*) 0.50 - 1.10 mg/dL   Calcium 9.3  8.4 - 10.5 mg/dL   Total Protein 7.3  6.0 - 8.3 g/dL   Albumin 3.9  3.5 - 5.2 g/dL   AST 18  0 - 37 U/L   ALT 7  0 - 35 U/L   Alkaline Phosphatase 110  39 - 117 U/L   Total Bilirubin 1.6 (*) 0.3 - 1.2 mg/dL   GFR calc non Af Amer 39 (*) >90 mL/min   GFR calc Af Amer 45 (*) >90 mL/min  APTT     Status: Abnormal   Collection Time     04/09/12  4:50 AM      Component Value Range   aPTT 48 (*) 24 - 37 seconds  PROTIME-INR     Status: Abnormal   Collection Time   04/09/12  4:50 AM      Component Value Range   Prothrombin Time 17.1 (*) 11.6 - 15.2 seconds   INR 1.43  0.00 - 1.49  GLUCOSE, CAPILLARY     Status: Normal   Collection Time   04/09/12  7:25 AM      Component Value Range   Glucose-Capillary 99  70 - 99 mg/dL    Imaging: Tele: AFib 100-130 NST: 01/2012 - negative for  ischemia CABG in ~1997  MAR Reviewed  Assessment/Plan:  Principal Problem:  *GI bleed Active Problems:  COPD  CAD (coronary artery disease), with CABG in 1997 and negative Nuc. 01/2012  Morbid obesity  Atrial fibrillation, chronic  Acute blood loss anemia, due to GI bleed  Anticoagulated on Coumadin, chronic for A. Fib  Blood in stool  Acute posthemorrhagic anemia  Coagulopathy - on chronic warfarin therapy for Afib   While NPO - agree with IV BB to avoid rapid AFib & BB withdrawal.  Has BP room - but would prefer Hyper vs Hypo-tension.  Would give IV Lopressor this AM pre-EGD as HR is up this AM. Has had negative troponin x 3.  No need to check further levels. Hgb with up to 8.5 after 2 Units PRBC - ? If continued loss vs. 7.3 was not truly as low as she actually was. INR now 1.43 - would hold warfarin indefinitely for now & not restart post GI work-up.  Was planning on having Knee surgery in ~1-2 weeks anyway.   Plan is for EGD (likely at [facility] today).  Time Spent Directly with Patient:  15 minutes   Meleni Delahunt W, M.D., M.S. [facility] [address] [place], [place]  [address]  [phone]  04/09/2012 8:09 AM      

## 2012-04-09 NOTE — Op Note (Signed)
[facility] [address] [place], [address]   OPERATIVE PROCEDURE REPORT  PATIENT: [name], [name]  MR#: [id number] BIRTHDATE: July 14, 1941  GENDER: Female ENDOSCOPIST: Jeani Hawking, MD ASSISTANT:   Olene Craven, Technician and Janae Sauce, RN PROCEDURE DATE: 04/09/2012 PROCEDURE:   EGD, diagnostic ASA CLASS:   Class III INDICATIONS:melena. MEDICATIONS: Fentanyl 25 mcg IV and Versed 2 mg IV TOPICAL ANESTHETIC:   Cetacaine Spray  DESCRIPTION OF PROCEDURE:   After the risks benefits and alternatives of the procedure were thoroughly explained, informed consent was obtained.  The Pentax Gastroscope [id number]  endoscope was introduced through the mouth  and advanced to the second portion of the duodenum Without limitations.      The instrument was slowly withdrawn as the mucosa was fully examined.    FINDINGS: The upper, middle and distal third of the esophagus were carefully inspected and no abnormalities were noted.  The z-line was well seen at the GEJ.  The endoscope was pushed into the fundus which was normal including a retroflexed view.  The antrum, gastric body, first and second part of the duodenum were unremarkable. Retroflexed views revealed an intact Nissen fundoplication.  S alight amount of blood was noted at a staple site.     The scope was then withdrawn from the patient and the procedure terminated.  COMPLICATIONS: There were no complications. IMPRESSION:Intact Nissen fundoplication. Tinge of blood at a staple site.  RECOMMENDATIONS:1) Colonoscopy tomorrow.  _______________________________ eSignedJeani Hawking, MD 04/09/2012 10:16 AM

## 2012-04-09 NOTE — H&P (View-Only) (Signed)
[facility] DAILY PROGRESS NOTE  NAME:  [name]   MRN: [id number] DOB:  1940/11/14   ADMIT DATE: 04/07/2012   Patient Description   71 y.o. female with PMH below: presented with weakness and LOC -- found to be anemic on initial blood work.  She did noted some chest tightness & mild sob.  Troponins Neg x 3 & negative Myoview in 01/2012.  In Afib - got Vit K.   Past Medical History  Diagnosis Date  . COPD (chronic obstructive pulmonary disease)   . Coronary atherosclerosis of unspecified type of vessel, native or graft   . Atrial fibrillation   . Unspecified venous (peripheral) insufficiency   . Other and unspecified hyperlipidemia   . Morbid obesity   . Esophageal reflux   . Irritable bowel syndrome   . 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 Other symptoms involving urinary system   . Chronic pain syndrome   . 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 Anxiety state, unspecified   . Herpes zoster without mention of complication   . Herpes zoster with other nervous system complications   . Myalgia and myositis, unspecified   . Lumbago   . Anticoagulated on Coumadin, chronic for A. Fib 04/08/2012    Length of Stay:  LOS: 2 days    Subjective:   Today [name] feels better --just a headache & her knee hurts.  Breathing fine & no further CP>  Objective:  Temp:  [98.3 F (36.8 C)-98.8 F (37.1 C)] 98.3 F (36.8 C) (09/21 0700) Pulse Rate:  [80-132] 100  (09/21 0234) Resp:  [20] 20  (09/21 0234) BP: (37-154)/(24-92) 154/80 mmHg (09/21 0334) SpO2:  [85 %-99 %] 97 % (09/21 0334) Weight:  [113.6 kg (250 lb 7.1 oz)] 113.6 kg (250 lb 7.1 oz) (09/21 0500) Weight change: -1.4 kg (-3 lb 1.4 oz) Physical Exam: General appearance: alert, cooperative, appears stated age, no distress and morbidly obese Neck: no adenopathy, no carotid bruit, no JVD, supple, symmetrical, trachea midline and thyroid not  enlarged, symmetric, no tenderness/mass/nodules Lungs: diminished breath sounds bibasilar and mild bibasilar crackles.  Pre-breathing Rx. Heart: irregularly irregular rhythm and tachycardic, normal S1 S2 Abdomen: morbidly obese, soft, NT, ND, NABS Extremities: no notable edema, SCDs in place Neurologic: Grossly normal  Intake/Output from previous day: 09/20 0701 - 09/21 0700 In: 2449 [P.O.:840; I.V.:980; Blood:629] Out: 2730 [Urine:2730]  Intake/Output Summary (Last 24 hours) at 04/09/12 0809 Last data filed at 04/09/12 0050  Gross per 24 hour  Intake   2224 ml  Output   2330 ml  Net   -106 ml    Results for orders placed during the hospital encounter of 04/07/12 (from the past 24 hour(s))  CK TOTAL AND CKMB     Status: Normal   Collection Time   04/08/12  3:24 PM      Component Value Range   Total CK 28  7 - 177 U/L   CK, MB 1.7  0.3 - 4.0 ng/mL   Relative Index RELATIVE INDEX IS INVALID  0.0 - 2.5  TROPONIN I     Status: Normal   Collection Time   04/08/12  3:25 PM      Component Value Range   Troponin I <0.30  <0.30 ng/mL  PROTIME-INR     Status: Abnormal   Collection Time   04/08/12  3:25 PM      Component Value Range  Prothrombin Time 17.1 (*) 11.6 - 15.2 seconds   INR 1.43  0.00 - 1.49  CBC     Status: Abnormal   Collection Time   04/08/12  3:25 PM      Component Value Range   WBC 5.8  4.0 - 10.5 K/uL   RBC 2.84 (*) 3.87 - 5.11 MIL/uL   Hemoglobin 8.5 (*) 12.0 - 15.0 g/dL   HCT 30.8 (*) 65.7 - 84.6 %   MCV 93.0  78.0 - 100.0 fL   MCH 29.9  26.0 - 34.0 pg   MCHC 32.2  30.0 - 36.0 g/dL   RDW 96.2 (*) 95.2 - 84.1 %   Platelets 135 (*) 150 - 400 K/uL  GLUCOSE, CAPILLARY     Status: Abnormal   Collection Time   04/08/12  7:34 PM      Component Value Range   Glucose-Capillary 108 (*) 70 - 99 mg/dL  TROPONIN I     Status: Normal   Collection Time   04/08/12  8:20 PM      Component Value Range   Troponin I <0.30  <0.30 ng/mL  GLUCOSE, CAPILLARY     Status:  Abnormal   Collection Time   04/08/12 11:17 PM      Component Value Range   Glucose-Capillary 117 (*) 70 - 99 mg/dL  TROPONIN I     Status: Normal   Collection Time   04/09/12  2:15 AM      Component Value Range   Troponin I <0.30  <0.30 ng/mL  GLUCOSE, CAPILLARY     Status: Abnormal   Collection Time   04/09/12  3:51 AM      Component Value Range   Glucose-Capillary 105 (*) 70 - 99 mg/dL  CBC     Status: Abnormal   Collection Time   04/09/12  4:50 AM      Component Value Range   WBC 5.3  4.0 - 10.5 K/uL   RBC 2.92 (*) 3.87 - 5.11 MIL/uL   Hemoglobin 8.6 (*) 12.0 - 15.0 g/dL   HCT 32.4 (*) 40.1 - 02.7 %   MCV 94.2  78.0 - 100.0 fL   MCH 29.5  26.0 - 34.0 pg   MCHC 31.3  30.0 - 36.0 g/dL   RDW 25.3 (*) 66.4 - 40.3 %   Platelets 133 (*) 150 - 400 K/uL  COMPREHENSIVE METABOLIC PANEL     Status: Abnormal   Collection Time   04/09/12  4:50 AM      Component Value Range   Sodium 138  135 - 145 mEq/L   Potassium 3.6  3.5 - 5.1 mEq/L   Chloride 98  96 - 112 mEq/L   CO2 28  19 - 32 mEq/L   Glucose, Bld 99  70 - 99 mg/dL   BUN 22  6 - 23 mg/dL   Creatinine, Ser 4.74 (*) 0.50 - 1.10 mg/dL   Calcium 9.3  8.4 - 25.9 mg/dL   Total Protein 7.3  6.0 - 8.3 g/dL   Albumin 3.9  3.5 - 5.2 g/dL   AST 18  0 - 37 U/L   ALT 7  0 - 35 U/L   Alkaline Phosphatase 110  39 - 117 U/L   Total Bilirubin 1.6 (*) 0.3 - 1.2 mg/dL   GFR calc non Af Amer 39 (*) >90 mL/min   GFR calc Af Amer 45 (*) >90 mL/min  APTT     Status: Abnormal   Collection Time  04/09/12  4:50 AM      Component Value Range   aPTT 48 (*) 24 - 37 seconds  PROTIME-INR     Status: Abnormal   Collection Time   04/09/12  4:50 AM      Component Value Range   Prothrombin Time 17.1 (*) 11.6 - 15.2 seconds   INR 1.43  0.00 - 1.49  GLUCOSE, CAPILLARY     Status: Normal   Collection Time   04/09/12  7:25 AM      Component Value Range   Glucose-Capillary 99  70 - 99 mg/dL    Imaging: Tele: AFib 100-130 NST: 01/2012 - negative for  ischemia CABG in ~1997  [facility] Reviewed  Assessment/Plan:  Principal Problem:  *GI bleed Active Problems:  COPD  CAD (coronary artery disease), with CABG in 1997 and negative Nuc. 01/2012  Morbid obesity  Atrial fibrillation, chronic  Acute blood loss anemia, due to GI bleed  Anticoagulated on Coumadin, chronic for A. Fib  Blood in stool  Acute posthemorrhagic anemia  Coagulopathy - on chronic warfarin therapy for Afib   While NPO - agree with IV BB to avoid rapid AFib & BB withdrawal.  Has BP room - but would prefer Hyper vs Hypo-tension.  Would give IV Lopressor this AM pre-EGD as HR is up this AM. Has had negative troponin x 3.  No need to check further levels. Hgb with up to 8.5 after 2 Units PRBC - ? If continued loss vs. 7.3 was not truly as low as she actually was. INR now 1.43 - would hold warfarin indefinitely for now & not restart post GI work-up.  Was planning on having Knee surgery in ~1-2 weeks anyway.   Plan is for EGD (likely at [facility] today).  Time Spent Directly with Patient:  15 minutes   Arisa Congleton W, M.D., M.S. [facility] [address] [place], [place]  [address]  [phone]  04/09/2012 8:09 AM

## 2012-04-09 NOTE — Op Note (Signed)
Technical problems preclude an EndoPro report.  After adequate sedation the endoscope was passed into the esophagus.  No abnormalities were noted in the esophagus, but just distal to the GE junction a slight tinge of blood was noted at a prior staple site.  Retroflexion revealed an intact Nissen fundoplication and there was no overt evidence of bleeding.  The gastric lumen was normal as well as the duodenum, i.e., no evidence of any ulcerations, erosions, polyps, masses, or vascular abnormalities.  The findings of the slight blood at the staple cannot explain the syncope and melena/anemia.  I will perform a colonoscopy for further evaluation.  The patient tolerated the procedure well and her pulse ox remained in the mid 90's.  Plan: 1) Colonoscopy tomorrow.

## 2012-04-09 NOTE — Interval H&P Note (Signed)
History and Physical Interval Note:  04/09/2012 9:06 AM  [name]  has presented today for surgery, with the diagnosis of gi bleed and anemia.  The various methods of treatment have been discussed with the patient and family. After consideration of risks, benefits and other options for treatment, the patient has consented to  Procedure(s) (LRB) with comments: ESOPHAGOGASTRODUODENOSCOPY (EGD) (N/A) - tentatively scheduled per Blase Mess due to patients breathing problems as a surgical intervention .  The patient's history has been reviewed, patient examined, no change in status, stable for surgery.  I have reviewed the patient's chart and labs.  Questions were answered to the patient's satisfaction.     [name] D

## 2012-04-09 NOTE — Progress Notes (Signed)
[name] Progress Note [name] TEAM 1 - Stepdown/ICU TEAM   [name] WUJ:[id number] DOB: 28-Feb-1941 DOA: 04/07/2012 PCP: Dana Mcalpine, MD  Brief narrative: 71 year-old female with history of CAD status post CABG, atrial fibrillation on Coumadin, COPD, over the last few days had been experiencing weakness dizziness and has had at least 3 episodes of loss of consciousness. The evening before her admit around 5 PM she had another episode when she tried to move she fell and lost consciousness. Afterwards the patient was weak and also had some chest tightness with mild shortness of breath. Since the symptoms persisted she came to the ER. In the ER patient was initially mildly hypotensive and was given fluids. Hemoglobin came back as 7.3 - dropped from 12 in June this year. Patient states that she usually has dark stools and thinks it's from having grape drink. Denies having used any NSAIDs. Her INR was therapeutic. Stool for occult blood was positive. Patient was been started on transfusion of PRBC and FFP and vitamin K.  Assessment/Plan:  Acute vs/ chronic GIB GI is following - pt had EGD this AM revealing no obvious source of bleeding - plan is for colo in AM  Acute blood loss anemia Hgb is holding steady s/p 2U PRBC - cont to follow trend  Chronic afib on anticoagulation remains somewhat tachycardic - cardiology is adjusting BB  Coumadin induced coagulopathy To remain off coumadin as per Cards recs  CAD s/p CABG Asymptomatic at this time - troponin normal   COPD Well compensated at this time  Chronic kidney disease Baseline crt appears to be 1.3-1.5  MRSA + Follow usual contact precautions  Code Status: Full Disposition Plan: remain in SDU until HR better controlled   Consultants: Cardiology GI  Procedures: 04/09/2012 - EGD - Intact Nissen fundoplication. Tinge of blood at a staple site.  Antibiotics: none  DVT prophylaxis: SCDs only due to active  bleeding  HPI/Subjective: Pt c/o unrelenting pain in her knees and low back.  Denies sob, cp, or abdom pain.    Objective: Blood pressure 154/81, pulse 100, temperature 98.3 F (36.8 C), temperature source Oral, resp. rate 22, height 5\' 8"  (1.727 m), weight 113.6 kg (250 lb 7.1 oz), SpO2 95.00%.  Intake/Output Summary (Last 24 hours) at 04/09/12 0952 Last data filed at 04/09/12 0900  Gross per 24 hour  Intake   1696 ml  Output   2330 ml  Net   -634 ml     Exam: General: No acute respiratory distress at rest  Lungs: mild diffuse exp wheeze - no focal crackles - distant bs th/o  Cardiovascular: irreg irreg - tachycardic - no gallup or appreciable rub Abdomen: morbidly obese, nontender, nondistended, soft, bowel sounds positive, no rebound, no ascites, no appreciable mass Extremities: No significant cyanosis, clubbing, or edema bilateral lower extremities - pain to palpation of B knees w/ no apparent effusions or erythema  Data Reviewed: Basic Metabolic Panel:  Lab 04/09/12 9562 04/08/12 0745 04/07/12 2158  NA 138 144 133*  K 3.6 4.0 3.9  CL 98 105 95*  CO2 28 29 29   GLUCOSE 99 91 102*  BUN 22 26* 26*  CREATININE 1.34* 1.37* 1.50*  CALCIUM 9.3 9.0 9.1  MG -- 2.2 --  PHOS -- -- --   Liver Function Tests:  Lab 04/09/12 0450 04/08/12 0745 04/07/12 2158  AST 18 16 18   ALT 7 7 9   ALKPHOS 110 116 140*  BILITOT 1.6* 1.1 0.5  PROT 7.3  6.8 7.6  ALBUMIN 3.9 3.8 3.9   CBC:  Lab 04/09/12 0450 04/08/12 1525 04/08/12 0745 04/08/12 0128 04/07/12 2158  WBC 5.3 5.8 6.0 -- 4.7  NEUTROABS -- -- -- -- 3.1  HGB 8.6* 8.5* 9.2* 7.4* 7.3*  HCT 27.5* 26.4* 29.1* 23.9* 23.9*  MCV 94.2 93.0 93.0 -- 95.6  PLT 133* 135* 143* -- 157   Cardiac Enzymes:  Lab 04/09/12 0759 04/09/12 0215 04/08/12 2020 04/08/12 1525 04/08/12 1524 04/08/12 0745  CKTOTAL -- -- -- -- 28 --  CKMB -- -- -- -- 1.7 --  CKMBINDEX -- -- -- -- -- --  TROPONINI <0.30 <0.30 <0.30 <0.30 -- <0.30   BNP (last 3  results)  Basename 12/22/11 1233  PROBNP 179.0*    Recent Results (from the past 240 hour(s))  MRSA PCR SCREENING     Status: Abnormal   Collection Time   04/08/12  5:54 AM      Component Value Range Status Comment   MRSA by PCR POSITIVE (*) NEGATIVE Final      Studies:  Recent x-ray studies have been reviewed in detail by the Attending Physician  Scheduled Meds:  Reviewed in detail by the Attending Physician   Lonia Blood, MD [name] Office  [phone] Pager [phone]  On-Call/Text Page:      [email or web address]      password [email or web address]  If 7PM-7AM, please contact night-coverage [email or web address] Password [email or web address] 04/09/2012, 9:52 AM   LOS: 2 days

## 2012-04-10 ENCOUNTER — Encounter (HOSPITAL_COMMUNITY)
Admission: EM | Disposition: A | Discharge status: Home / Self Care | Payer: Self-pay | Source: Home / Self Care | Attending: Internal Medicine

## 2012-04-10 ENCOUNTER — Encounter (HOSPITAL_COMMUNITY): Payer: Self-pay | Admitting: *Deleted

## 2012-04-10 LAB — BASIC METABOLIC PANEL
BUN: 19 mg/dL (ref 6–23)
Chloride: 100 mEq/L (ref 96–112)
GFR calc Af Amer: 50 mL/min — ABNORMAL LOW (ref >90)
Potassium: 3.6 mEq/L (ref 3.5–5.1)
Sodium: 141 mEq/L (ref 135–145)

## 2012-04-10 LAB — CBC
HCT: 28.8 % — ABNORMAL LOW (ref 36.0–46.0)
Hemoglobin: 9.1 g/dL — ABNORMAL LOW (ref 12.0–15.0)
MCHC: 31.6 g/dL (ref 30.0–36.0)
RBC: 3.08 MIL/uL — ABNORMAL LOW (ref 3.87–5.11)

## 2012-04-10 SURGERY — COLONOSCOPY
Anesthesia: Moderate Sedation

## 2012-04-10 MED ORDER — VITAMIN B-12 1000 MCG PO TABS
1000.0000 ug | ORAL_TABLET | Freq: Every day | ORAL | Status: DC
  Administered 2012-04-10 – 2012-04-13 (×4): 1000 ug via ORAL
  Filled 2012-04-10 (×4): qty 1

## 2012-04-10 MED ORDER — FUROSEMIDE 40 MG PO TABS: 40.0000 mg | ORAL_TABLET | Freq: Every day | ORAL | Status: DC

## 2012-04-10 MED ORDER — FENTANYL CITRATE 0.05 MG/ML IJ SOLN
INTRAMUSCULAR | Status: AC
  Filled 2012-04-10: qty 2

## 2012-04-10 MED ORDER — FUROSEMIDE 40 MG PO TABS
40.0000 mg | ORAL_TABLET | Freq: Two times a day (BID) | ORAL | Status: DC
  Administered 2012-04-10: 40 mg via ORAL
  Filled 2012-04-10 (×3): qty 1

## 2012-04-10 MED ORDER — FUROSEMIDE 10 MG/ML IJ SOLN
40.0000 mg | Freq: Once | INTRAMUSCULAR | Status: AC
  Administered 2012-04-10: 40 mg via INTRAVENOUS
  Filled 2012-04-10: qty 8

## 2012-04-10 MED ORDER — PANTOPRAZOLE SODIUM 40 MG PO TBEC
40.0000 mg | DELAYED_RELEASE_TABLET | Freq: Every day | ORAL | Status: DC
  Administered 2012-04-10 – 2012-04-13 (×4): 40 mg via ORAL
  Filled 2012-04-10 (×5): qty 1

## 2012-04-10 MED ORDER — FUROSEMIDE 10 MG/ML IJ SOLN
20.0000 mg | Freq: Once | INTRAMUSCULAR | Status: AC
  Administered 2012-04-10: 20 mg via INTRAVENOUS

## 2012-04-10 MED ORDER — DIPHENHYDRAMINE HCL 50 MG/ML IJ SOLN
INTRAMUSCULAR | Status: AC
  Filled 2012-04-10: qty 1

## 2012-04-10 MED ORDER — MIDAZOLAM HCL 5 MG/ML IJ SOLN
INTRAMUSCULAR | Status: AC
  Filled 2012-04-10: qty 2

## 2012-04-10 NOTE — Progress Notes (Signed)
Progress Note for [facility]  Subjective: Feeling SOB.  Objective: Vital signs in last 24 hours: Temp:  [98.3 F (36.8 C)-99.5 F (37.5 C)] 98.7 F (37.1 C) (09/22 0353) Pulse Rate:  [113] 113  (09/21 2147) Resp:  [18] 18  (09/22 0816) BP: (98-178)/(59-122) 158/99 mmHg (09/22 0816) SpO2:  [90 %-99 %] 90 % (09/22 0816) Weight:  [113.5 kg (250 lb 3.6 oz)] 113.5 kg (250 lb 3.6 oz) (09/22 0353) Last BM Date: 04/09/12  Intake/Output from previous day: 09/21 0701 - 09/22 0700 In: 610 [P.O.:600; I.V.:10] Out: 1550 [Urine:1550] Intake/Output this shift:    General appearance: alert and no distress Resp: some diffuse crackles and decreased breath sounds, poor air movement. Cardio: irregularly irregular and tachycardic GI: soft, non-tender; bowel sounds normal; no masses,  no organomegaly Extremities: extremities normal, atraumatic, no cyanosis or edema  Lab Results:  Basename 04/09/12 0450 04/08/12 1525 04/08/12 0745  WBC 5.3 5.8 6.0  HGB 8.6* 8.5* 9.2*  HCT 27.5* 26.4* 29.1*  PLT 133* 135* 143*   BMET  Basename 04/09/12 0450 04/08/12 0745 04/07/12 2158  NA 138 144 133*  K 3.6 4.0 3.9  CL 98 105 95*  CO2 28 29 29   GLUCOSE 99 91 102*  BUN 22 26* 26*  CREATININE 1.34* 1.37* 1.50*  CALCIUM 9.3 9.0 9.1   LFT  Basename 04/09/12 0450  PROT 7.3  ALBUMIN 3.9  AST 18  ALT 7  ALKPHOS 110  BILITOT 1.6*  BILIDIR --  IBILI --   PT/INR  Basename 04/09/12 0450 04/08/12 1525  LABPROT 17.1* 17.1*  INR 1.43 1.43   Hepatitis Panel No results found for this basename: HEPBSAG,HCVAB,HEPAIGM,HEPBIGM in the last 72 hours C-Diff No results found for this basename: CDIFFTOX:3 in the last 72 hours Fecal Lactopherrin No results found for this basename: FECLLACTOFRN in the last 72 hours  Studies/Results: No results found.  Medications:  Scheduled:   . Chlorhexidine Gluconate Cloth  6 each Topical Q0600  . furosemide  40 mg Intravenous Once  . furosemide  40 mg Oral Daily   . gabapentin  600 mg Oral QID  . hydrALAZINE      . influenza  inactive virus vaccine  0.5 mL Intramuscular Tomorrow-1000  . ipratropium  0.5 mg Nebulization Q6H  . isosorbide mononitrate  30 mg Oral QHS  . levalbuterol  0.63 mg Nebulization Q6H  . metoprolol  50 mg Oral BID  . mupirocin ointment  1 application Nasal BID  . pantoprazole (PROTONIX) IV  40 mg Intravenous Q24H  . pneumococcal 23 valent vaccine  0.5 mL Intramuscular Tomorrow-1000  . polyethylene glycol-electrolytes  4,000 mL Oral Once  . DISCONTD:  ceFAZolin (ANCEF) IV  2 g Intravenous 60 min Pre-Op  . DISCONTD: metoprolol  2.5 mg Intravenous Q4H  . DISCONTD: pantoprazole (PROTONIX) IV  40 mg Intravenous Q12H   Continuous:   . sodium chloride 10 mL/hr (04/08/12 1034)  . sodium chloride    . DISCONTD: sodium chloride    . DISCONTD: sodium chloride      Assessment/Plan: 1) Anemia/Melena. 2) SOB. 3) Afib.   The patient's pulse ox is at 91-92% on 3 liters of oxygen.  She reports feeling short of breath.  Clinically she does not appear to be as stable as yesterday.  I will cancel the colonoscopy until her respiratory status can be improved and stabilized.    Plan: 1) Follow HGB. 2) Optimize respiratory status. 3) Colonoscopy when patient is stable.  LOS: 3 days  Kamayah Pillay D 04/10/2012, 8:47 AM

## 2012-04-10 NOTE — Progress Notes (Signed)
EGD intact Nissen fundoplication.  Subjective: Tolerated EGD yesterday without complication - no source of bleeding HR goes up with activity to 130s & down to 90s at night.  Objective: Vital signs in last 24 hours: Temp:  [98.3 F (36.8 C)-99.5 F (37.5 C)] 98.7 F (37.1 C) (09/22 0353) Pulse Rate:  [113] 113  (09/21 2147) Resp:  [22] 22  (09/21 0800) BP: (98-178)/(59-122) 178/67 mmHg (09/22 0353) SpO2:  [90 %-100 %] 91 % (09/22 0353) Weight:  [113.5 kg (250 lb 3.6 oz)] 113.5 kg (250 lb 3.6 oz) (09/22 0353) Weight change: -0.1 kg (-3.5 oz) Last BM Date: 04/09/12 Intake/Output from previous day: -940   09/21 0701 - 09/22 0700 In: 610 [P.O.:600; I.V.:10] Out: 1550 [Urine:1550] Intake/Output this shift:   PE: General appearance: alert, cooperative, mild distress, morbidly obese and pale Neck: no carotid bruit, supple, symmetrical, trachea midline and unable to determine JVP due to body habitus Lungs: diminished breath sounds bilaterally and diffuse fine interstitial sounds; poor overall air movement, no rales Heart: irregularly irregular rhythm and tachycardic; distant heart sounds due to body habitus & "isolation stethoscope" Abdomen: morbidly obese, unable to palpate HSM; non-tender Extremities: edema trace Neurologic: Grossly normal   Lab Results:  Basename 04/09/12 0450 04/08/12 1525  WBC 5.3 5.8  HGB 8.6* 8.5*  HCT 27.5* 26.4*  PLT 133* 135*   BMET  Basename 04/09/12 0450 04/08/12 0745  NA 138 144  K 3.6 4.0  CL 98 105  CO2 28 29  GLUCOSE 99 91  BUN 22 26*  CREATININE 1.34* 1.37*  CALCIUM 9.3 9.0    Basename 04/09/12 0759 04/09/12 0215  TROPONINI <0.30 <0.30    Lab Results  Component Value Date   CHOL 104 12/22/2011   HDL 50.20 12/22/2011   LDLCALC 35 12/22/2011   TRIG 96.0 12/22/2011   CHOLHDL 2 12/22/2011   Lab Results  Component Value Date   HGBA1C 5.9 12/22/2011     Lab Results  Component Value Date   TSH 1.46 12/22/2011    Hepatic Function  Panel  Basename 04/09/12 0450  PROT 7.3  ALBUMIN 3.9  AST 18  ALT 7  ALKPHOS 110  BILITOT 1.6*  BILIDIR --  IBILI --    EKG: Orders placed during the hospital encounter of 04/07/12  . EKG 12-LEAD  . EKG 12-LEAD    Studies/Results: No results found.  Medications: I have reviewed the patient's current medications.    . Chlorhexidine Gluconate Cloth  6 each Topical Q0600  . gabapentin  600 mg Oral QID  . hydrALAZINE      . influenza  inactive virus vaccine  0.5 mL Intramuscular Tomorrow-1000  . ipratropium  0.5 mg Nebulization Q6H  . isosorbide mononitrate  30 mg Oral QHS  . levalbuterol  0.63 mg Nebulization Q6H  . metoprolol  50 mg Oral BID  . mupirocin ointment  1 application Nasal BID  . pantoprazole (PROTONIX) IV  40 mg Intravenous Q24H  . pneumococcal 23 valent vaccine  0.5 mL Intramuscular Tomorrow-1000  . polyethylene glycol-electrolytes  4,000 mL Oral Once  . DISCONTD:  ceFAZolin (ANCEF) IV  2 g Intravenous 60 min Pre-Op  . DISCONTD: metoprolol  2.5 mg Intravenous Q4H  . DISCONTD: pantoprazole (PROTONIX) IV  40 mg Intravenous Q12H   Assessment/Plan: Principal Problem:  *GI bleed Active Problems:  Morbid obesity  Atrial fibrillation, chronic  COPD  Acute blood loss anemia, due to GI bleed  CAD (coronary artery disease), with CABG in 1997  and negative Nuc. 01/2012  Anticoagulated on Coumadin, chronic for A. Fib  Blood in stool  Acute posthemorrhagic anemia  Coagulopathy - on chronic warfarin therapy for Afib  PLAN: Patient Description   71 y.o. female with PMH below: presented with weakness and LOC -- found to be anemic on initial blood work. She did noted some chest tightness & mild sob. Troponins Neg x 3 & negative Myoview in 01/2012. In Afib - got Vit K.    Negative troponins, Labs pending. For colonoscopy today Afib with rates in the 120's at times.  Occ. PVCs Complains of SOB HTN On Lopressor 50 mg po BID,  Coumadin on hold for chronic a. fib   LOS: 3 days   Bowers,Dana R 04/10/2012, 7:46 AM  I saw & examined (my exam above) the patient along with Ms. Dana Paras, NP.  I agree with her findings & assessment.  She continues to be in Afib with HRs averaging in 120s -- partly due to anemia & COPD; she has plenty of HR, will add CCB for additional rate control to avoid XS BB dosing with COPD. With her underlying COPD & CAD, may well benefit from additional PRBC to get Hgb > 9.  Afib RVR in setting of likely diastolic dysfunction -- will re-initiate lasix to avoid diastolic HF.  IV Lasix today post colonoscopy & restart PO this PM.  For colonoscopy today.  No plan to re-initiate warfarin at this time, INR yesterday was 1.43.    [name], M.D., M.S. [facility] [address] [place], [place]  [address]  [phone] Pager # [phone]  04/10/2012 8:22 AM

## 2012-04-10 NOTE — Progress Notes (Addendum)
[name] Progress Note  TEAM 1 - Stepdown/ICU TEAM   [name] JYN:[id number] DOB: 11-12-1940 DOA: 04/07/2012 PCP: Michele Mcalpine, MD  Brief narrative: 71 year-old female with history of CAD status post CABG, atrial fibrillation on Coumadin, COPD, over the last few days had been experiencing weakness dizziness and has had at least 3 episodes of loss of consciousness. The evening before her admit around 5 PM she had another episode when she tried to move she fell and lost consciousness. Afterwards the patient was weak and also had some chest tightness with mild shortness of breath. Since the symptoms persisted she came to the ER. In the ER patient was initially mildly hypotensive and was given fluids. Hemoglobin came back as 7.3 - dropped from 12 in June this year. Patient states that she usually has dark stools and thinks it's from having grape drink. Denies having used any NSAIDs. Her INR was therapeutic. Stool for occult blood was positive. Patient was been started on transfusion of PRBC and FFP and vitamin K.  Assessment/Plan:  Acute vs/ chronic GIB GI is following - pt had EGD revealing no obvious source of bleeding - plan is for colo when more stable from respiratory standpoint (had to be canceled today) - clinically I suspect her bleeding has ceased w/ discontinuation of her coumadin  Acute blood loss anemia Hgb is holding steady s/p 2U PRBC - cont to follow trend - goal is to keep Hgb 8.0 or > unless hypotension becomes an issue (pt currently hypertensive)  Chronic afib with acute RVR remains somewhat tachycardic - cardiology is adjusting medical tx  Coumadin induced coagulopathy To remain off coumadin as per Cards recs  CAD s/p CABG Asymptomatic at this time - troponin normal   COPD Somewhat more sob this morning - I suspect a degree of pulmonary edema - will diurese and follow - no indication for steroids at this time as she is not wheezing   Suspected  diastolic CHF +/- Cor Pulmonale As noted above, I suspect an element of pulmonary edema is to blame for her dyspnea this morning - will diurese and follow - home diuretic dose has been on hold due to bleeding/episodic hypotension -  Consider TTE once rate controlled to evaluate  Chronic kidney disease Baseline crt appears to be 1.3-1.5 - renal function is stable at her baseline  MRSA + Follow usual contact precautions  Code Status: Full Disposition Plan: remain in SDU   Consultants: Cardiology GI  Procedures: 04/09/2012 - EGD - Intact Nissen fundoplication. Tinge of blood at a staple site.  Antibiotics: none  DVT prophylaxis: SCDs only due to active bleeding  HPI/Subjective: Pt c/o dyspnea and ha today.  No cp, n/v, or abdom pain.    Objective: Blood pressure 158/99, pulse 113, temperature 98.7 F (37.1 C), temperature source Oral, resp. rate 18, height 5\' 8"  (1.727 m), weight 113.5 kg (250 lb 3.6 oz), SpO2 90.00%.  Intake/Output Summary (Last 24 hours) at 04/10/12 0915 Last data filed at 04/10/12 0600  Gross per 24 hour  Intake    600 ml  Output   1350 ml  Net   -750 ml     Exam: General: No acute respiratory distress at rest  Lungs: diffuse crackles, no wheeze - very distant breath sounds  Cardiovascular: irreg irreg - tachycardic - no gallup or appreciable rub Abdomen: morbidly obese, nontender, nondistended, soft, bowel sounds positive, no rebound, no ascites, no appreciable mass Extremities: No significant cyanosis or clubbing;  1+  edema bilateral lower extremities - pain to palpation of B knees w/ no apparent effusions or erythema  Data Reviewed: Basic Metabolic Panel:  Lab 04/09/12 9147 04/08/12 0745 04/07/12 2158  NA 138 144 133*  K 3.6 4.0 3.9  CL 98 105 95*  CO2 28 29 29   GLUCOSE 99 91 102*  BUN 22 26* 26*  CREATININE 1.34* 1.37* 1.50*  CALCIUM 9.3 9.0 9.1  MG -- 2.2 --  PHOS -- -- --   Liver Function Tests:  Lab 04/09/12 0450 04/08/12 0745  04/07/12 2158  AST 18 16 18   ALT 7 7 9   ALKPHOS 110 116 140*  BILITOT 1.6* 1.1 0.5  PROT 7.3 6.8 7.6  ALBUMIN 3.9 3.8 3.9   CBC:  Lab 04/09/12 0450 04/08/12 1525 04/08/12 0745 04/08/12 0128 04/07/12 2158  WBC 5.3 5.8 6.0 -- 4.7  NEUTROABS -- -- -- -- 3.1  HGB 8.6* 8.5* 9.2* 7.4* 7.3*  HCT 27.5* 26.4* 29.1* 23.9* 23.9*  MCV 94.2 93.0 93.0 -- 95.6  PLT 133* 135* 143* -- 157   Cardiac Enzymes:  Lab 04/09/12 0759 04/09/12 0215 04/08/12 2020 04/08/12 1525 04/08/12 1524 04/08/12 0745  CKTOTAL -- -- -- -- 28 --  CKMB -- -- -- -- 1.7 --  CKMBINDEX -- -- -- -- -- --  TROPONINI <0.30 <0.30 <0.30 <0.30 -- <0.30   BNP (last 3 results)  Basename 12/22/11 1233  PROBNP 179.0*    Recent Results (from the past 240 hour(s))  MRSA PCR SCREENING     Status: Abnormal   Collection Time   04/08/12  5:54 AM      Component Value Range Status Comment   MRSA by PCR POSITIVE (*) NEGATIVE Final      Studies:  Recent x-ray studies have been reviewed in detail by the Attending Physician  Scheduled Meds:  Reviewed in detail by the Attending Physician   Lonia Blood, MD [name] Office  [phone] Pager [phone]  On-Call/Text Page:      [email or web address]      password [email or web address]  If 7PM-7AM, please contact night-coverage [email or web address] Password [email or web address] 04/10/2012, 9:15 AM   LOS: 3 days

## 2012-04-11 ENCOUNTER — Encounter (HOSPITAL_COMMUNITY)
Admission: EM | Disposition: A | Discharge status: Home / Self Care | Payer: Self-pay | Source: Home / Self Care | Attending: Internal Medicine

## 2012-04-11 ENCOUNTER — Encounter (HOSPITAL_COMMUNITY): Payer: Self-pay

## 2012-04-11 ENCOUNTER — Encounter (HOSPITAL_COMMUNITY): Payer: Self-pay | Admitting: *Deleted

## 2012-04-11 ENCOUNTER — Inpatient Hospital Stay (HOSPITAL_COMMUNITY): Admission: RE | Admit: 2012-04-11 | Payer: Medicare Other | Source: Ambulatory Visit

## 2012-04-11 ENCOUNTER — Inpatient Hospital Stay (HOSPITAL_COMMUNITY): Payer: Medicare Other

## 2012-04-11 DIAGNOSIS — J449 Chronic obstructive pulmonary disease, unspecified: Secondary | ICD-10-CM

## 2012-04-11 DIAGNOSIS — I4891 Unspecified atrial fibrillation: Secondary | ICD-10-CM

## 2012-04-11 DIAGNOSIS — R195 Other fecal abnormalities: Secondary | ICD-10-CM | POA: Diagnosis present

## 2012-04-11 DIAGNOSIS — D649 Anemia, unspecified: Secondary | ICD-10-CM

## 2012-04-11 HISTORY — PX: COLONOSCOPY: SHX5424

## 2012-04-11 LAB — CBC
Hemoglobin: 9.1 g/dL — ABNORMAL LOW (ref 12.0–15.0)
MCH: 29.7 pg (ref 26.0–34.0)
MCV: 93.8 fL (ref 78.0–100.0)
RBC: 3.06 MIL/uL — ABNORMAL LOW (ref 3.87–5.11)

## 2012-04-11 LAB — BASIC METABOLIC PANEL
CO2: 32 mEq/L (ref 19–32)
Glucose, Bld: 92 mg/dL (ref 70–99)
Potassium: 2.7 mEq/L — CL (ref 3.5–5.1)
Sodium: 138 mEq/L (ref 135–145)

## 2012-04-11 SURGERY — COLONOSCOPY
Anesthesia: Moderate Sedation

## 2012-04-11 MED ORDER — SODIUM CHLORIDE 0.9 % IV SOLN: INTRAVENOUS | Status: DC

## 2012-04-11 MED ORDER — MIDAZOLAM HCL 5 MG/ML IJ SOLN
INTRAMUSCULAR | Status: AC
  Filled 2012-04-11: qty 2

## 2012-04-11 MED ORDER — MIDAZOLAM HCL 5 MG/5ML IJ SOLN
INTRAMUSCULAR | Status: DC | PRN
  Administered 2012-04-11 (×2): 2 mg via INTRAVENOUS
  Administered 2012-04-11 (×2): 1 mg via INTRAVENOUS
  Administered 2012-04-11 (×2): 2 mg via INTRAVENOUS

## 2012-04-11 MED ORDER — FUROSEMIDE 40 MG PO TABS
40.0000 mg | ORAL_TABLET | Freq: Every day | ORAL | Status: DC
  Administered 2012-04-12 – 2012-04-13 (×2): 40 mg via ORAL
  Filled 2012-04-11 (×2): qty 1

## 2012-04-11 MED ORDER — MIDAZOLAM HCL 5 MG/ML IJ SOLN
INTRAMUSCULAR | Status: AC
  Filled 2012-04-11: qty 1

## 2012-04-11 MED ORDER — POTASSIUM CHLORIDE CRYS ER 20 MEQ PO TBCR
40.0000 meq | EXTENDED_RELEASE_TABLET | Freq: Two times a day (BID) | ORAL | Status: DC
  Administered 2012-04-11: 40 meq via ORAL
  Filled 2012-04-11: qty 2

## 2012-04-11 MED ORDER — FENTANYL CITRATE 0.05 MG/ML IJ SOLN
INTRAMUSCULAR | Status: DC | PRN
  Administered 2012-04-11: 10 ug via INTRAVENOUS
  Administered 2012-04-11: 25 ug via INTRAVENOUS
  Administered 2012-04-11: 20 ug via INTRAVENOUS
  Administered 2012-04-11: 25 ug via INTRAVENOUS

## 2012-04-11 MED ORDER — FENTANYL CITRATE 0.05 MG/ML IJ SOLN
INTRAMUSCULAR | Status: AC
  Filled 2012-04-11: qty 2

## 2012-04-11 MED ORDER — POTASSIUM CHLORIDE CRYS ER 20 MEQ PO TBCR
40.0000 meq | EXTENDED_RELEASE_TABLET | Freq: Three times a day (TID) | ORAL | Status: AC
  Administered 2012-04-11 – 2012-04-12 (×4): 40 meq via ORAL
  Filled 2012-04-11 (×4): qty 2

## 2012-04-11 MED ORDER — GABAPENTIN 400 MG PO CAPS
400.0000 mg | ORAL_CAPSULE | Freq: Four times a day (QID) | ORAL | Status: DC
Start: 2012-04-11 — End: 2012-04-13
  Administered 2012-04-11 – 2012-04-13 (×7): 400 mg via ORAL
  Filled 2012-04-11 (×10): qty 1

## 2012-04-11 MED ORDER — GABAPENTIN 800 MG PO TABS
400.0000 mg | ORAL_TABLET | Freq: Four times a day (QID) | ORAL | Status: DC
  Administered 2012-04-11: 400 mg via ORAL
  Filled 2012-04-11 (×4): qty 1

## 2012-04-11 NOTE — Op Note (Signed)
[name] [facility] [address] [place], [address]   COLONOSCOPY PROCEDURE REPORT  PATIENT: [name], [name]  MR#: [id number] BIRTHDATE: 03-09-1941 , 71  yrs. old GENDER: Female ENDOSCOPIST: Meryl Dare, MD, [facility]  PROCEDURE DATE:  04/11/2012 PROCEDURE:   Colonoscopy, diagnostic ASA CLASS:   Class III INDICATIONS: occult blood in stool and anemia, non-specific. MEDICATIONS: These medications were titrated to patient response per physician's verbal order, Fentanyl 100 mcg IV, and Versed 12 mg IV  DESCRIPTION OF PROCEDURE:   After the risks benefits and alternatives of the procedure were thoroughly explained, informed consent was obtained.  A digital rectal exam revealed no abnormalities of the rectum.   The Pentax Ped Colon [id number] endoscope was introduced through the anus and advanced to the cecum, which was identified by both the appendix and ileocecal valve. No adverse events experienced with a tortuous and redundant colon.   The quality of the prep was adequate, using MoviPrep  The instrument was then slowly withdrawn as the colon was fully examined.   COLON FINDINGS: The colonic mucosa appeared normal. No diverticulosis, AVMs, polyps or other lesions noted. Retroflexed views revealed no abnormalities. The time to cecum=6 minutes 00 seconds.  Withdrawal time=13 minutes 00 seconds.  The scope was withdrawn and the procedure completed. COMPLICATIONS: There were no complications.  ENDOSCOPIC IMPRESSION: 1.  The colonic mucosa appeared normal  RECOMMENDATIONS: 1.  Given your comorbidities and age, you will not need another colonoscopy for colon cancer screening or polyp surveillance. These types of tests usually stop around the age 40. 2.  Follow up with Dr. Melvia Heaps as needed   eSigned:  Meryl Dare, MD, [facility] 04/11/2012 2:23 PM   cc: Melvia Heaps, MD

## 2012-04-11 NOTE — Progress Notes (Signed)
Subjective: Feeling better, no complaints of SOB  Objective: Vital signs in last 24 hours: Temp:  [97.8 F (36.6 C)-99.9 F (37.7 C)] 98.6 F (37 C) (09/23 0800) Pulse Rate:  [89] 89  (09/22 2225) Resp:  [18] 18  (09/22 1532) BP: (113-156)/(53-97) 132/60 mmHg (09/23 0735) SpO2:  [97 %-99 %] 98 % (09/23 0752) Weight:  [107.7 kg (237 lb 7 oz)] 107.7 kg (237 lb 7 oz) (09/23 0324) Weight change: -5.8 kg (-12 lb 12.6 oz) Last BM Date: 04/10/12 Intake/Output from previous day: -1370 09/22 0701 - 09/23 0700 In: 680 [P.O.:680] Out: 2050 [Urine:2050] Intake/Output this shift:    PE: General:alert and oriented, pleasant affect Heart:S1S2 Irreg irreg Lungs:no wheezes, no rales Abd:+ BS, soft, non tender Ext:no edema     Lab Results:  Basename 04/11/12 0528 04/10/12 0940  WBC 5.4 7.2  HGB 9.1* 9.1*  HCT 28.7* 28.8*  PLT 132* 130*   BMET  Basename 04/11/12 0528 04/10/12 0940  NA 138 141  K 2.7* 3.6  CL 95* 100  CO2 32 26  GLUCOSE 92 112*  BUN 19 19  CREATININE 1.18* 1.24*  CALCIUM 9.3 9.9    Basename 04/09/12 0759 04/09/12 0215  TROPONINI <0.30 <0.30    Lab Results  Component Value Date   CHOL 104 12/22/2011   HDL 50.20 12/22/2011   LDLCALC 35 12/22/2011   TRIG 96.0 12/22/2011   CHOLHDL 2 12/22/2011   Lab Results  Component Value Date   HGBA1C 5.9 12/22/2011     Lab Results  Component Value Date   TSH 1.46 12/22/2011    Hepatic Function Panel  Basename 04/09/12 0450  PROT 7.3  ALBUMIN 3.9  AST 18  ALT 7  ALKPHOS 110  BILITOT 1.6*  BILIDIR --  IBILI --    EKG: Orders placed during the hospital encounter of 04/07/12  . EKG 12-LEAD  . EKG 12-LEAD  . EKG    Studies/Results: Dg Chest Port 1 View  04/11/2012  *RADIOLOGY REPORT*  Clinical Data: Shortness of breath.  PORTABLE CHEST - 1 VIEW  Comparison: 04/07/2012, 07/28/2011, and 01/03/2010  Findings: There is slight interstitial pulmonary edema as compared to multiple prior exams. Heart size is normal.   Slight pulmonary vascular prominence with distention of the azygos vein.  No effusions.  No acute osseous abnormality.  IMPRESSION: Slight interstitial pulmonary edema.   Original Report Authenticated By: Gwynn Burly, M.D.     Medications: I have reviewed the patient's current medications.    . Chlorhexidine Gluconate Cloth  6 each Topical Q0600  . furosemide  20 mg Intravenous Once  . furosemide  40 mg Intravenous Once  . furosemide  40 mg Oral BID  . gabapentin  600 mg Oral QID  . ipratropium  0.5 mg Nebulization Q6H  . isosorbide mononitrate  30 mg Oral QHS  . levalbuterol  0.63 mg Nebulization Q6H  . metoprolol  50 mg Oral BID  . mupirocin ointment  1 application Nasal BID  . pantoprazole  40 mg Oral Q1200  . potassium chloride  40 mEq Oral BID  . vitamin B-12  1,000 mcg Oral Daily  . DISCONTD: furosemide  40 mg Oral Daily  . DISCONTD: pantoprazole (PROTONIX) IV  40 mg Intravenous Q24H   Assessment/Plan: Principal Problem:  *GI bleed Active Problems:  Morbid obesity  Atrial fibrillation, chronic  COPD  Acute blood loss anemia, due to GI bleed  CAD (coronary artery disease), with CABG in 1997 and negative Nuc.  01/2012  Anticoagulated on Coumadin, chronic for A. Fib  Blood in stool  Acute posthemorrhagic anemia  Coagulopathy - on chronic warfarin therapy for Afib  PLAN: K+ low, replacing.  For colonoscopy today.  A fib fairly rate controlled at 90-108. -1370 from Lasix yesterday.     LOS: 4 days   INGOLD,LAURA R 04/11/2012, 8:51 AM

## 2012-04-11 NOTE — Interval H&P Note (Signed)
History and Physical Interval Note:  04/11/2012 1:24 PM  [name]  has presented today for surgery, with the diagnosis of anemia, blood in stool  The various methods of treatment have been discussed with the patient and family. After consideration of risks, benefits and other options for treatment, the patient has consented to  Procedure(s) (LRB) with comments: COLONOSCOPY (N/A) as a surgical intervention .  The patient's history has been reviewed, patient examined, no change in status, stable for surgery.  I have reviewed the patient's chart and labs.  Questions were answered to the patient's satisfaction.     Venita Lick. Russella Dar MD [facility]

## 2012-04-11 NOTE — Progress Notes (Signed)
[name] Progress Note [place] TEAM 1 - Stepdown/ICU TEAM   [name] ZOX:[id number] DOB: 20-Nov-1940 DOA: 04/07/2012 PCP: Michele Mcalpine, MD  Brief narrative: 71 year-old female with history of CAD status post CABG, atrial fibrillation on Coumadin, COPD, over the last few days had been experiencing weakness dizziness and has had at least 3 episodes of loss of consciousness. The evening before her admit around 5 PM she had another episode when she tried to move she fell and lost consciousness. Afterwards the patient was weak and also had some chest tightness with mild shortness of breath. Since the symptoms persisted she came to the ER. In the ER patient was initially mildly hypotensive and was given fluids. Hemoglobin came back as 7.3 - dropped from 12 in June this year. Patient states that she usually has dark stools and thinks it's from having grape drink. Denies having used any NSAIDs. Her INR was therapeutic. Stool for occult blood was positive. Patient was been started on transfusion of PRBC and FFP and vitamin K.  Assessment/Plan:  Acute vs/ chronic GIB GI is following - pt had EGD revealing no obvious source of bleeding - plan is for colo today - clinically I suspect her bleeding has ceased w/ discontinuation of her coumadin  Acute blood loss anemia Hgb is holding steady s/p 2U PRBC - cont to follow trend - goal is to keep Hgb 8.0 or > unless hypotension becomes an issue (pt currently hypertensive)  Chronic afib with acute RVR Rate is currently well controlled - Cardiology is following   Coumadin induced coagulopathy To remain off coumadin as per Cards recs - if reversible/treatable source of bleeding is noted on colo we could consider resuming anticoag - will await colo results and discuss w/ Cards as needed  Severe hypokalemia Due to diuretic - replace and follow - check Mg in AM  CAD s/p CABG Asymptomatic at this time - troponin normal   COPD Well  compensated at this time - i suspect her mild resp distress yesterday was related to mild volume overload - cont to follow - ambulate w/ PT/OT  Suspected diastolic CHF +/- Cor Pulmonale As noted above, I suspect an element of pulmonary edema is to blame for her dyspnea yesterday - she has improved markedly w/ diuresis of  ~1400cc- home diuretic dose has been on hold due to bleeding/episodic hypotension -  Consider TTE once rate controlled to evaluate - cont scheduled diuretic and follow Is/Os  Chronic kidney disease Baseline crt appears to be 1.3-1.5 - renal function is stable at her baseline/better than her baseline  MRSA + Follow usual contact precautions  Code Status: Full Disposition Plan: stable for transfer to tele bed - begin PT/OT - D/C will depend on colo results and PT/OT eval - tentative plan for Wednesday  Consultants: Cardiology GI  Procedures: 04/09/2012 - EGD - Intact Nissen fundoplication. Tinge of blood at a staple site. 04/10/2012 - Colo - pending  Antibiotics: none  DVT prophylaxis: SCDs only due to active bleeding  HPI/Subjective: Much improved with no dyspnea today.  Denies cp, sob, n/v, abdom pain, or any recurrent melena/hematochezia.   Objective: Blood pressure 132/60, pulse 89, temperature 98.6 F (37 C), temperature source Oral, resp. rate 18, height 5\' 8"  (1.727 m), weight 107.7 kg (237 lb 7 oz), SpO2 98.00%.  Intake/Output Summary (Last 24 hours) at 04/11/12 1136 Last data filed at 04/11/12 0900  Gross per 24 hour  Intake    800 ml  Output  1650 ml  Net   -850 ml    Exam: General: No acute respiratory distress at rest  Lungs: diffuse crackles, no wheeze - very distant breath sounds  Cardiovascular: irreg irreg but rate controlled - no gallup or appreciable rub Abdomen: morbidly obese, nontender, nondistended, soft, bowel sounds positive, no rebound, no ascites, no appreciable mass Extremities: No significant cyanosis or clubbing;  1+ edema  bilateral lower extremities - pain to palpation of B knees w/ no apparent effusions or erythema  Data Reviewed: Basic Metabolic Panel:  Lab 04/11/12 9147 04/10/12 0940 04/09/12 0450 04/08/12 0745 04/07/12 2158  NA 138 141 138 144 133*  K 2.7* 3.6 3.6 4.0 3.9  CL 95* 100 98 105 95*  CO2 32 26 28 29 29   GLUCOSE 92 112* 99 91 102*  BUN 19 19 22  26* 26*  CREATININE 1.18* 1.24* 1.34* 1.37* 1.50*  CALCIUM 9.3 9.9 9.3 9.0 9.1  MG -- -- -- 2.2 --  PHOS -- -- -- -- --   Liver Function Tests:  Lab 04/09/12 0450 04/08/12 0745 04/07/12 2158  AST 18 16 18   ALT 7 7 9   ALKPHOS 110 116 140*  BILITOT 1.6* 1.1 0.5  PROT 7.3 6.8 7.6  ALBUMIN 3.9 3.8 3.9   CBC:  Lab 04/11/12 0528 04/10/12 0940 04/09/12 0450 04/08/12 1525 04/08/12 0745 04/07/12 2158  WBC 5.4 7.2 5.3 5.8 6.0 --  NEUTROABS -- -- -- -- -- 3.1  HGB 9.1* 9.1* 8.6* 8.5* 9.2* --  HCT 28.7* 28.8* 27.5* 26.4* 29.1* --  MCV 93.8 93.5 94.2 93.0 93.0 --  PLT 132* 130* 133* 135* 143* --   Cardiac Enzymes:  Lab 04/09/12 0759 04/09/12 0215 04/08/12 2020 04/08/12 1525 04/08/12 1524 04/08/12 0745  CKTOTAL -- -- -- -- 28 --  CKMB -- -- -- -- 1.7 --  CKMBINDEX -- -- -- -- -- --  TROPONINI <0.30 <0.30 <0.30 <0.30 -- <0.30    Recent Results (from the past 240 hour(s))  MRSA PCR SCREENING     Status: Abnormal   Collection Time   04/08/12  5:54 AM      Component Value Range Status Comment   MRSA by PCR POSITIVE (*) NEGATIVE Final      Studies:  Recent x-ray studies have been reviewed in detail by the Attending Physician  Scheduled Meds:  Reviewed in detail by the Attending Physician   Lonia Blood, MD [name] Office  [phone] Pager [phone]  On-Call/Text Page:      [email or web address]      password [email or web address]  If 7PM-7AM, please contact night-coverage [email or web address] Password [facility] 04/11/2012, 11:36 AM   LOS: 4 days

## 2012-04-11 NOTE — Progress Notes (Signed)
Pt. Seen and examined. Agree with the NP/PA-C note as written.  Dyspnea improved with lasix, may be related to transfusions. Plan for coloscopy today to assess for ongoing ABLA.  A-fib with reasonable rate control.  Chrystie Nose, MD, [facility] Attending Cardiologist [facility]

## 2012-04-11 NOTE — OR Nursing (Signed)
Pt very restless and sensitive during procedure, not easily sedated. bp dropped to 86/ and hr into 50s.Marland KitchenMarland KitchenMarland KitchenMarland Kitchenpt became apneic for a few seconds, sats to low 80s. Increased O2 to 6L/min and stimulated pt, open up ivfs. Preparing to give reversals when sats/hr/bp came up and pt restless again. MD at bedside, commented was probably vagal. VS stable at end of procedure.  Additional fent given at 1356 but was unable to chart at the time, and MD has discontinued meds before i was able to document.

## 2012-04-11 NOTE — Progress Notes (Signed)
[facility] Daily Rounding Note 04/11/2012, 8:41 AM  SUBJECTIVE:       Breathing is better.  No pain.  No stools since prep.  Taking clears, but took none this AM  OBJECTIVE:         Vital signs in last 24 hours:    Temp:  [97.8 F (36.6 C)-99.9 F (37.7 C)] 98.2 F (36.8 C) (09/23 0735) Pulse Rate:  [89] 89  (09/22 2225) Resp:  [18] 18  (09/22 1532) BP: (113-156)/(53-97) 132/60 mmHg (09/23 0735) SpO2:  [97 %-99 %] 98 % (09/23 0752) Weight:  [237 lb 7 oz (107.7 kg)] 237 lb 7 oz (107.7 kg) (09/23 0324) Last BM Date: 04/10/12 General: looks better, not well   Heart: subtle irregular rate, not accelerated Chest: no wheezing.  Reduced BS globally but esp at bases.  Some crackles in left base.  No gross dyspnea.  Abdomen: not tender, active BS  Extremities: no pedal edema Neuro/Psych:  Pleasant, slightly anxious but less than last week.   Intake/Output from previous day: 09/22 0701 - 09/23 0700 In: 680 [P.O.:680] Out: 2050 [Urine:2050]   Lab Results:  Basename 04/11/12 0528 04/10/12 0940 04/09/12 0450  WBC 5.4 7.2 5.3  HGB 9.1* 9.1* 8.6*  HCT 28.7* 28.8* 27.5*  PLT 132* 130* 133*   BMET  Basename 04/11/12 0528 04/10/12 0940 04/09/12 0450  NA 138 141 138  K 2.7* 3.6 3.6  CL 95* 100 98  CO2 32 26 28  GLUCOSE 92 112* 99  BUN 19 19 22   CREATININE 1.18* 1.24* 1.34*  CALCIUM 9.3 9.9 9.3   LFT  Basename 04/09/12 0450  PROT 7.3  ALBUMIN 3.9  AST 18  ALT 7  ALKPHOS 110  BILITOT 1.6*  BILIDIR --  IBILI --   PT/INR  Basename 04/09/12 0450 04/08/12 1525  LABPROT 17.1* 17.1*  INR 1.43 1.43   Studies/Results: Dg Chest Port 1 View 04/11/2012  *RADIOLOGY REPORT*  Clinical Data: Shortness of breath.  PORTABLE CHEST - 1 VIEW  Comparison: 04/07/2012, 07/28/2011, and 01/03/2010  Findings: There is slight interstitial pulmonary edema as compared to multiple prior exams. Heart size is normal.  Slight pulmonary vascular prominence with distention of the azygos vein.  No  effusions.  No acute osseous abnormality.  IMPRESSION: Slight interstitial pulmonary edema.   Original Report Authenticated By: Gwynn Burly, M.D.     ASSESMENT: * Acute anemia, blood in stool on rectal exam. S/P PRBCs EGD 04/09/12:  Slight blood tinge at anastomosis staple site at EGJ.   Colonoscopy cancelled 9/22 due to resp status. Last colon in 2007 showing int hemorrhoids, poor prep.  * Chronic Coumadin. INR not supratherapeutic,  S/p Vit K and FFP. INR normal, PT slightly elevated.  * A Fib with rates to 120's at times. Rate under better control at 89 today. * Remote Nissen fundoplication. Takes daily Omeprazole.  * Cough, worsening SOB.  Cor Pulmonale, diastolic CHF.  She looks better today.   * Chronic kidney dz. * End stage left knee osteoarthritis, due for TKR end of this week but surgery has now been cancelled.  * multiple falls at home.      PLAN: *  Colonoscopy today.    LOS: 4 days   Jennye Moccasin  04/11/2012, 8:41 AM Pager: [phone]    I have taken an interval history, reviewed the chart and examined the patient. Cardiopulmonary status has improved and she is stable for colonoscopy today. Hypokalemia needs replacement. I  agree with the extender's note, impression and recommendations.   Venita Lick. Russella Dar MD Clementeen Graham

## 2012-04-12 ENCOUNTER — Encounter (HOSPITAL_COMMUNITY): Payer: Self-pay

## 2012-04-12 ENCOUNTER — Encounter (HOSPITAL_COMMUNITY): Payer: Self-pay | Admitting: Gastroenterology

## 2012-04-12 DIAGNOSIS — J209 Acute bronchitis, unspecified: Secondary | ICD-10-CM

## 2012-04-12 LAB — BASIC METABOLIC PANEL
BUN: 17 mg/dL (ref 6–23)
Chloride: 101 mEq/L (ref 96–112)
GFR calc Af Amer: 52 mL/min — ABNORMAL LOW (ref >90)
Glucose, Bld: 83 mg/dL (ref 70–99)
Potassium: 4.7 mEq/L (ref 3.5–5.1)
Sodium: 139 mEq/L (ref 135–145)

## 2012-04-12 LAB — RETICULOCYTES: Retic Ct Pct: 2.8 % (ref 0.4–3.1)

## 2012-04-12 LAB — CBC
Hemoglobin: 8.9 g/dL — ABNORMAL LOW (ref 12.0–15.0)
MCH: 29.7 pg (ref 26.0–34.0)
Platelets: 144 10*3/uL — ABNORMAL LOW (ref 150–400)
RBC: 3 MIL/uL — ABNORMAL LOW (ref 3.87–5.11)
WBC: 4.9 10*3/uL (ref 4.0–10.5)

## 2012-04-12 MED ORDER — WARFARIN - PHARMACIST DOSING INPATIENT: Freq: Every day | Status: DC

## 2012-04-12 MED ORDER — WARFARIN SODIUM 5 MG PO TABS
5.0000 mg | ORAL_TABLET | Freq: Once | ORAL | Status: AC
  Administered 2012-04-12: 5 mg via ORAL
  Filled 2012-04-12: qty 1

## 2012-04-12 NOTE — Progress Notes (Signed)
Subjective: No chest pain mild SOB with walk, also afib with HR up to 140 with ambulation  Objective: Vital signs in last 24 hours: Temp:  [97.8 F (36.6 C)-98.5 F (36.9 C)] 98.5 F (36.9 C) (09/24 0500) Pulse Rate:  [86-145] 145  (09/24 1058) Resp:  [12-29] 16  (09/23 1715) BP: (58-155)/(36-89) 151/64 mmHg (09/24 1021) SpO2:  [85 %-100 %] 99 % (09/24 1058) Weight:  [107 kg (235 lb 14.3 oz)] 107 kg (235 lb 14.3 oz) (09/24 0500) Weight change: -0.7 kg (-1 lb 8.7 oz) Last BM Date: 04/11/12 Intake/Output from previous day: -40, wt stable at 107 but down from admit of 115 09/23 0701 - 09/24 0700 In: 360 [P.O.:360] Out: 400 [Urine:400] Intake/Output this shift:    PE: General:alert and oriented, pleasant affect, much improved in appearance  Heart:S1S2 irreg irreg Lungs: clear without rales or wheezes UJW:JXBJ, non tender Ext:no edema    Lab Results:  [facility] 04/12/12 0550 04/11/12 0528  WBC 4.9 5.4  HGB 8.9* 9.1*  HCT 28.2* 28.7*  PLT 144* 132*   BMET  Basename 04/12/12 0550 04/11/12 0528  NA 139 138  K 4.7 2.7*  CL 101 95*  CO2 27 32  GLUCOSE 83 92  BUN 17 19  CREATININE 1.19* 1.18*  CALCIUM 9.7 9.3   No results found for this basename: TROPONINI:2,CK,MB:2 in the last 72 hours  Lab Results  Component Value Date   CHOL 104 12/22/2011   HDL 50.20 12/22/2011   LDLCALC 35 12/22/2011   TRIG 96.0 12/22/2011   CHOLHDL 2 12/22/2011   Lab Results  Component Value Date   HGBA1C 5.9 12/22/2011     Lab Results  Component Value Date   TSH 1.46 12/22/2011      EKG: Orders placed during the hospital encounter of 04/07/12  . EKG 12-LEAD  . EKG 12-LEAD  . EKG    Studies/Results: Dg Chest Port 1 View  04/11/2012  *RADIOLOGY REPORT*  Clinical Data: Shortness of breath.  PORTABLE CHEST - 1 VIEW  Comparison: 04/07/2012, 07/28/2011, and 01/03/2010  Findings: There is slight interstitial pulmonary edema as compared to multiple prior exams. Heart size is normal.  Slight  pulmonary vascular prominence with distention of the azygos vein.  No effusions.  No acute osseous abnormality.  IMPRESSION: Slight interstitial pulmonary edema.   Original Report Authenticated By: Gwynn Burly, M.D.     Medications: I have reviewed the patient's current medications.    . Chlorhexidine Gluconate Cloth  6 each Topical Q0600  . furosemide  40 mg Oral Daily  . gabapentin  400 mg Oral QID  . ipratropium  0.5 mg Nebulization Q6H  . isosorbide mononitrate  30 mg Oral QHS  . levalbuterol  0.63 mg Nebulization Q6H  . metoprolol  50 mg Oral BID  . mupirocin ointment  1 application Nasal BID  . pantoprazole  40 mg Oral Q1200  . potassium chloride  40 mEq Oral TID  . vitamin B-12  1,000 mcg Oral Daily  . DISCONTD: furosemide  40 mg Oral BID  . DISCONTD: gabapentin  400 mg Oral QID  . DISCONTD: gabapentin  600 mg Oral QID    Assessment/Plan: Principal Problem:  *GI bleed Active Problems:  Morbid obesity  Atrial fibrillation, chronic  COPD  Acute blood loss anemia, due to GI bleed  CAD (coronary artery disease), with CABG in 1997 and negative Nuc. 01/2012  Anticoagulated on Coumadin, chronic for A. Fib  Blood in stool  Acute posthemorrhagic  anemia  Coagulopathy - on chronic warfarin therapy for Afib  Nonspecific abnormal finding in stool contents   PLAN: H/H stable with slow drift down.  Pt aware of risks with resuming coumadin but prefers to try.  [facility] to resume coumadin but not yet ordered.  Would not do crossover, but would keep 1-2 days after starting to monitor.  EGD and colonoscopy complete without reason for bleed. This does place her at higher risk for bleed with coumadin.  Is back on home meds for diastolic HF and rate control of A fib.  Perhaps with continued anemia HR increased with exertion, even sitting in the chair.    MD to see.    LOS: 5 days   Alexander Aument R 04/12/2012, 11:14 AM

## 2012-04-12 NOTE — Progress Notes (Signed)
Patient ID: [name], female   DOB: 20-Aug-1940, 71 y.o.   MRN: [id number]  [name] PROGRESS NOTE  [name] WUJ:[id number] DOB: 1941-06-12 DOA: 04/07/2012 PCP: Michele Mcalpine, MD  Brief narrative:  71 year-old female with history of CAD status post CABG, atrial fibrillation on Coumadin, COPD, admitted to [facility] 04/07/2012 with progressively worsening generalized weakness associated with dizziness and 3 episodes of loss of consciousness, also associated with shortness of breath and intermittent episodes of chest discomfort. She was found to be hypotensive in ED upon arrival with BP in 90's/40's. Hg in ED was 7.3 on arrival and that was down from her last Hg 12 (in June 2013). Pt was on Coumadin for atrial fibrillation and in ED FOBT +. Pt was transfused with 2 units of PRBC and FFP, also given vitamin K.  Assessment/Plan:  Acute vs/ chronic GIB  - GI is following  - pt had EGD revealing no obvious source of bleeding  - colonoscopy was done earlier today 09/24 by Dr. Russella Dar and results revealed no evident source of bleeding - Hg is slightly down from yesterday but pt reports feeling better - she is still off coumadin - will check CBC in AM  Acute blood loss anemia  - still somewhat unclear source of blood loss - I will check anemia panel today and will perhaps help to start Iron supplementation if needed - CBC in AM  Chronic afib with acute RVR  - Rate is currently well controlled  - Cardiology is following   Coumadin induced coagulopathy  - colonoscopy did not reveal any clear and active source of bleeding - I think it would ne reasonable to restart Coumadin and will ask pharmacy to help with dosing  Severe hypokalemia  - Due to diuretic - magnesium is within normal limits - potassium is also within normal limits this AM - BMP in AM  CAD s/p CABG  - Asymptomatic at this time  - troponin x 3 normal   COPD  - Well compensated at this time - continue  Ipratropium and Levabuterol   Suspected diastolic CHF +/- Cor Pulmonale  - pt denies shortness of breath this AM - she is maintaining good oxygen saturation - will continue to monitor vital signs per floor protocol - oxygen as needed - also follow up on daily weights, I's and O's  Chronic kidney disease  - Baseline Cr appears to be 1.3-1.5  - renal function is stable   MRSA +  - Follow usual contact precautions   Code Status: Full   Disposition Plan: PT evaluation still pending and I suspect pt will be ready for discharge in next 1-2 days based on PT recommendations,also again I am planning to restart Coumadin today and based on how she does and if no additional blood loss episodes d/c date likely 09/25 or 09/26  Consultants:  Cardiology  GI   Procedures:  04/09/2012 - EGD - Intact Nissen fundoplication. Tinge of blood at a staple site.  04/10/2012 - Colo - No active bleeding noted and no obvious source identified, normal colon  Antibiotics:  none   DVT prophylaxis:  SCDs only due to active bleeding   HPI/Subjective: No events overnight. Pt reports feeling better this AM and denies chest pain or shortness of breath, no blood in stool.   Objective: Filed Vitals:   04/11/12 2308 04/12/12 0133 04/12/12 0500 04/12/12 1021  BP: 132/61  126/89 151/64  Pulse: 98  102 86  Temp:   98.5 F (  36.9 C)   TempSrc:   Oral   Resp:      Height:      Weight:   107 kg (235 lb 14.3 oz)   SpO2:  91% 93%     Intake/Output Summary (Last 24 hours) at 04/12/12 1024 Last data filed at 04/11/12 1600  Gross per 24 hour  Intake    240 ml  Output    400 ml  Net   -160 ml    Exam:   General:  Pt is alert, follows commands appropriately, not in acute distress  Cardiovascular: Irregular rate and rhythm, no murmurs, no rubs, no gallops  Respiratory: Clear to auscultation bilaterally with bibasilar crackles, decreased breath sounds mostly at bases  Abdomen: Soft, non tender, non  distended, bowel sounds present, no guarding  Extremities: +1 bilateral lower extremity pitting edema, pulses DP and PT palpable bilaterally  Neuro: Grossly nonfocal  Data Reviewed: Basic Metabolic Panel:  Lab 04/12/12 4098 04/11/12 0528 04/10/12 0940 04/09/12 0450 04/08/12 0745  NA 139 138 141 138 144  K 4.7 2.7* 3.6 3.6 4.0  CL 101 95* 100 98 105  CO2 27 32 26 28 29   GLUCOSE 83 92 112* 99 91  BUN 17 19 19 22  26*  CREATININE 1.19* 1.18* 1.24* 1.34* 1.37*  CALCIUM 9.7 9.3 9.9 9.3 9.0  MG 2.1 -- -- -- 2.2  PHOS -- -- -- -- --   Liver Function Tests:  Lab 04/09/12 0450 04/08/12 0745 04/07/12 2158  AST 18 16 18   ALT 7 7 9   ALKPHOS 110 116 140*  BILITOT 1.6* 1.1 0.5  PROT 7.3 6.8 7.6  ALBUMIN 3.9 3.8 3.9   CBC:  Lab 04/12/12 0550 04/11/12 0528 04/10/12 0940 04/09/12 0450 04/08/12 1525 04/07/12 2158  WBC 4.9 5.4 7.2 5.3 5.8 --  NEUTROABS -- -- -- -- -- 3.1  HGB 8.9* 9.1* 9.1* 8.6* 8.5* --  HCT 28.2* 28.7* 28.8* 27.5* 26.4* --  MCV 94.0 93.8 93.5 94.2 93.0 --  PLT 144* 132* 130* 133* 135* --   Cardiac Enzymes:  Lab 04/09/12 0759 04/09/12 0215 04/08/12 2020 04/08/12 1525 04/08/12 1524 04/08/12 0745  CKTOTAL -- -- -- -- 28 --  CKMB -- -- -- -- 1.7 --  CKMBINDEX -- -- -- -- -- --  TROPONINI <0.30 <0.30 <0.30 <0.30 -- <0.30   BNP: No components found with this basename: POCBNP:5 CBG:  Lab 04/09/12 1628 04/09/12 1147 04/09/12 0725 04/09/12 0351 04/08/12 2317  GLUCAP 95 110* 99 105* 117*    Recent Results (from the past 240 hour(s))  MRSA PCR SCREENING     Status: Abnormal   Collection Time   04/08/12  5:54 AM      Component Value Range Status Comment   MRSA by PCR POSITIVE (*) NEGATIVE Final      Scheduled Meds:   . furosemide  40 mg Oral Daily  . gabapentin  400 mg Oral QID  . ipratropium  0.5 mg Nebulization Q6H  . isosorbide mononitrate  30 mg Oral QHS  . levalbuterol  0.63 mg Nebulization Q6H  . metoprolol  50 mg Oral BID  . pantoprazole  40 mg Oral  Q1200  . potassium chloride  40 mEq Oral TID  . vitamin B-12  1,000 mcg Oral Daily   Continuous Infusions:   . sodium chloride 10 mL/hr (04/08/12 1034)  . DISCONTD: sodium chloride       Debbora Presto, MD  [facility] Pager [phone]  If  7PM-7AM, please contact night-coverage [email or web address] Password [email or web address] 04/12/2012, 10:24 AM   LOS: 5 days

## 2012-04-12 NOTE — Progress Notes (Addendum)
ANTICOAGULATION CONSULT NOTE - Initial Consult  Pharmacy Consult for Coumadin Indication: atrial fibrillation  No Known Allergies   Labs:  Basename 04/12/12 0550 04/11/12 0528 04/10/12 0940  HGB 8.9* 9.1* --  HCT 28.2* 28.7* 28.8*  PLT 144* 132* 130*  APTT -- -- --  LABPROT -- -- --  INR -- -- --  HEPARINUNFRC -- -- --  CREATININE 1.19* 1.18* 1.24*  CKTOTAL -- -- --  CKMB -- -- --  TROPONINI -- -- --    Estimated Creatinine Clearance: 56.3 ml/min (by C-G formula based on Cr of 1.19).   Medical History: Past Medical History  Diagnosis Date  . Coronary atherosclerosis of unspecified type of vessel, native or graft   . Atrial fibrillation   . Unspecified venous (peripheral) insufficiency   . Other and unspecified hyperlipidemia   . Morbid obesity   . Esophageal reflux   . Irritable bowel syndrome   . Other symptoms involving urinary system   . Chronic pain syndrome   . 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 Anxiety state, unspecified   . Herpes zoster without mention of complication   . Herpes zoster with other nervous system complications   . Myalgia and myositis, unspecified   . Lumbago   . Anticoagulated on Coumadin, chronic for A. Fib 04/08/2012  . COPD (chronic obstructive pulmonary disease)   . Unspecified disorder resulting from impaired renal function     Assessment: On Coumadin PTA for AFib Dose PTA = 2.5 mg MWF, 5 mg TTSS GI work-up complete with no source of bleeding found  Goal of Therapy:  INR 2-3 Monitor platelets by anticoagulation protocol: Yes   Plan:  1) Coumadin 5 mg po x 1 dose tonight 2) Daily INR  Thank you. Okey Regal, PharmD  04/12/2012,11:19 AM

## 2012-04-12 NOTE — Progress Notes (Signed)
Pt. Seen and examined. Agree with the NP/PA-C note as written.  A-fib rate appears stable unless she exerts herself. I would not suggest uptitrating her AVN blockers due to increased risk of hypotension and orthostasis which may increase her fall risk. Could consider Eliquis instead of warfarin - she could get the reduced 2.5 mg BID dose, which is associated with significantly fewer adverse bleeding events than warfarin - of course, at a higher monthly cost.  Chrystie Nose, MD, [facility] Attending Cardiologist [facility]

## 2012-04-12 NOTE — Evaluation (Signed)
Physical Therapy Evaluation Patient Details Name: [name] MRN: [id number] DOB: 1941-07-16 Today's Date: 04/12/2012 Time: 7829-5621 PT Time Calculation (min): 23 min  PT Assessment / Plan / Recommendation Clinical Impression  Pt admitted with GIB and anemia and progressing well with mobility. Pt states she feels fatigued with gait and not yet back to baseline. Pt encouraged to increase mobility with nursing staff and agreeable to acute therapy. Will follow acutely to maximize gait and mobility for safe return home. Discussed with pt importance of bil LE HEP prior to TKA.     PT Assessment  Patient needs continued PT services    Follow Up Recommendations  No PT follow up    Barriers to Discharge None      Equipment Recommendations  None recommended by PT    Recommendations for Other Services     Frequency Min 3X/week    Precautions / Restrictions Precautions Precautions: Fall Restrictions Weight Bearing Restrictions: No   Pertinent Vitals/Pain HR 118 at rest up to 145 with gait and 125 at rest end of session O2 sats 99% on RA      Mobility  Bed Mobility Bed Mobility: Not assessed Transfers Transfers: Sit to Stand;Stand to Sit Sit to Stand: 5: Supervision;From chair/3-in-1 Stand to Sit: 5: Supervision;To chair/3-in-1 Details for Transfer Assistance: x 3 trials with cueing for sequence and safety Ambulation/Gait Ambulation/Gait Assistance: 5: Supervision Ambulation Distance (Feet): 80 Feet Assistive device: Rolling walker Ambulation/Gait Assistance Details: pt with cueing to step into RW Gait Pattern: Step-through pattern;Decreased stride length;Trunk flexed Gait velocity: decreased    Exercises     PT Diagnosis: Difficulty walking  PT Problem List: Decreased activity tolerance;Decreased mobility PT Treatment Interventions: Gait training;DME instruction;Functional mobility training;Therapeutic exercise;Patient/family education;Therapeutic activities   PT  Goals Acute Rehab PT Goals PT Goal Formulation: With patient/family Time For Goal Achievement: 04/19/12 Potential to Achieve Goals: Good Pt will go Supine/Side to Sit: with modified independence;with HOB 0 degrees PT Goal: Supine/Side to Sit - Progress: Goal set today Pt will go Sit to Supine/Side: with modified independence;with HOB 0 degrees PT Goal: Sit to Supine/Side - Progress: Goal set today Pt will go Sit to Stand: with modified independence PT Goal: Sit to Stand - Progress: Goal set today Pt will go Stand to Sit: with modified independence PT Goal: Stand to Sit - Progress: Goal set today Pt will Ambulate: >150 feet;with least restrictive assistive device;with modified independence PT Goal: Ambulate - Progress: Goal set today  Visit Information  Last PT Received On: 04/12/12 Assistance Needed: +1    Subjective Data  Subjective: I am waiting on my knee replacement Patient Stated Goal: return home and get my TKA   Prior Functioning  Home Living Lives With: Spouse Available Help at Discharge: Family;Available 24 hours/day Type of Home: House Home Access: Ramped entrance Home Layout: One level Bathroom Shower/Tub: Engineer, manufacturing systems: Standard Home Adaptive Equipment: Bedside commode/3-in-1;Straight cane;Shower chair with back;Walker - rolling;Grab bars in shower Prior Function Level of Independence: Needs assistance Needs Assistance: Meal Prep;Light Housekeeping;Bathing;Dressing Bath: Supervision/set-up Dressing: Minimal Meal Prep: Maximal Light Housekeeping: Maximal Able to Take Stairs?: No Driving: No Vocation: Retired Comments: Pt awaiting LTKA and spouse has been assisting particularly with lower body dressing due to knee issues Communication Communication: No difficulties    Cognition  Overall Cognitive Status: Appears within functional limits for tasks assessed/performed Arousal/Alertness: Awake/alert Orientation Level: Appears intact for tasks  assessed Behavior During Session: [facility] for tasks performed    Extremity/Trunk Assessment Right Upper  Extremity Assessment RUE ROM/Strength/Tone: [facility] for tasks assessed Left Upper Extremity Assessment LUE ROM/Strength/Tone: [facility] for tasks assessed Right Lower Extremity Assessment RLE ROM/Strength/Tone: [facility] for tasks assessed Left Lower Extremity Assessment LLE ROM/Strength/Tone: [facility] for tasks assessed Trunk Assessment Trunk Assessment: Normal   Balance    End of Session PT - End of Session Equipment Utilized During Treatment: Gait belt Activity Tolerance: Patient tolerated treatment well Patient left: in chair;with call bell/phone within reach;with family/visitor present  GP     Toney Sang Beth 04/12/2012, 11:03 AM  Delaney Meigs, PT [phone]

## 2012-04-13 LAB — CBC
HCT: 28.5 % — ABNORMAL LOW (ref 36.0–46.0)
Hemoglobin: 9.1 g/dL — ABNORMAL LOW (ref 12.0–15.0)
MCH: 29.6 pg (ref 26.0–34.0)
MCV: 92.8 fL (ref 78.0–100.0)
RBC: 3.07 MIL/uL — ABNORMAL LOW (ref 3.87–5.11)
WBC: 4.6 10*3/uL (ref 4.0–10.5)

## 2012-04-13 LAB — BASIC METABOLIC PANEL
BUN: 17 mg/dL (ref 6–23)
CO2: 27 mEq/L (ref 19–32)
Calcium: 9.9 mg/dL (ref 8.4–10.5)
Chloride: 99 mEq/L (ref 96–112)
Creatinine, Ser: 1.32 mg/dL — ABNORMAL HIGH (ref 0.50–1.10)
Glucose, Bld: 97 mg/dL (ref 70–99)

## 2012-04-13 LAB — VITAMIN B12: Vitamin B-12: 1961 pg/mL — ABNORMAL HIGH (ref 211–911)

## 2012-04-13 LAB — IRON AND TIBC
Iron: 30 ug/dL — ABNORMAL LOW (ref 42–135)
Saturation Ratios: 8 % — ABNORMAL LOW (ref 20–55)
TIBC: 376 ug/dL (ref 250–470)

## 2012-04-13 MED ORDER — METOPROLOL TARTRATE 50 MG PO TABS
75.0000 mg | ORAL_TABLET | Freq: Two times a day (BID) | ORAL | Status: DC
  Filled 2012-04-13: qty 1

## 2012-04-13 MED ORDER — LEVALBUTEROL HCL 0.63 MG/3ML IN NEBU: 0.6300 mg | INHALATION_SOLUTION | Freq: Four times a day (QID) | RESPIRATORY_TRACT | Status: DC | PRN

## 2012-04-13 MED ORDER — METOPROLOL TARTRATE 25 MG PO TABS: 75.0000 mg | ORAL_TABLET | Freq: Two times a day (BID) | ORAL | Status: DC

## 2012-04-13 MED ORDER — WARFARIN SODIUM 5 MG PO TABS
5.0000 mg | ORAL_TABLET | Freq: Once | ORAL | Status: DC
  Filled 2012-04-13: qty 1

## 2012-04-13 NOTE — Progress Notes (Signed)
Reviewed discharge instructions with patient and husband, they stated their understanding.  Patient's lopressor dose increased to 75 mg per cardiologist recommendations, explained to patient and hand written on discharge instructions, she stated her understanding.  Patient discharged via wheelchair home with husband.  [name]

## 2012-04-13 NOTE — Evaluation (Signed)
Occupational Therapy Evaluation Patient Details Name: [name] MRN: [id number] DOB: 07/31/1940 Today's Date: 04/13/2012 Time: 3016-0109 OT Time Calculation (min): 15 min  OT Assessment / Plan / Recommendation Clinical Impression  Pt admitted for TKA but did not end up having it due to heart issues.  Pt will d/c home and return when stable for TKA.  No OT needs at this time.    OT Assessment  Patient does not need any further OT services    Follow Up Recommendations  No OT follow up    Barriers to Discharge      Equipment Recommendations  None recommended by OT    Recommendations for Other Services    Frequency       Precautions / Restrictions Precautions Precautions: Fall Restrictions Weight Bearing Restrictions: No   Pertinent Vitals/Pain Pt with no pain reported.    ADL  Eating/Feeding: Performed;Independent Where Assessed - Eating/Feeding: Chair Grooming: Performed;Wash/dry hands;Wash/dry face;Supervision/safety Where Assessed - Grooming: Unsupported standing Upper Body Bathing: Simulated;Supervision/safety Where Assessed - Upper Body Bathing: Unsupported sit to stand Lower Body Bathing: Simulated;Supervision/safety Where Assessed - Lower Body Bathing: Unsupported sit to stand Upper Body Dressing: Simulated;Supervision/safety Where Assessed - Upper Body Dressing: Unsupported sitting Lower Body Dressing: Performed;Minimal assistance Where Assessed - Lower Body Dressing: Unsupported sit to stand Toilet Transfer: Performed;Supervision/safety Toilet Transfer Method: Sit to Barista: Comfort height toilet;Grab bars Toileting - Architect and Hygiene: Performed;Supervision/safety Where Assessed - Glass blower/designer Manipulation and Hygiene: Standing Transfers/Ambulation Related to ADLs: Pt S with all mobiltiy with a walker which seems to be close to her baseline. ADL Comments: Pts husband has been assisting with LE adls.  Pt  has adaptive equipment at home but says it is faster for him to assist. Pt at baseline with adls.    OT Diagnosis:    OT Problem List:   OT Treatment Interventions:     OT Goals    Visit Information  Last OT Received On: 04/13/12 Assistance Needed: +1    Subjective Data  Subjective: "I think I may go home today." Patient Stated Goal: to get well from this so I can get my TKA.   Prior Functioning  Vision/Perception  Home Living Lives With: Spouse Available Help at Discharge: Family;Available 24 hours/day Type of Home: House Home Access: Ramped entrance Home Layout: One level Bathroom Shower/Tub: Engineer, manufacturing systems: Standard Home Adaptive Equipment: Bedside commode/3-in-1;Straight cane;Shower chair with back;Walker - rolling;Grab bars in shower Prior Function Level of Independence: Needs assistance Needs Assistance: Meal Prep;Light Housekeeping;Bathing;Dressing Bath: Supervision/set-up Dressing: Minimal Meal Prep: Maximal Light Housekeeping: Maximal Able to Take Stairs?: No Driving: No Vocation: Retired Comments: Pt waiting for TKA.  Came in for it on this visit and did not get it due to heart issues.  Will d/c home and return for TKA later. Communication Communication: No difficulties Dominant Hand: Right   Vision - Assessment Vision Assessment: Vision not tested  Cognition  Overall Cognitive Status: Appears within functional limits for tasks assessed/performed Arousal/Alertness: Awake/alert Orientation Level: Appears intact for tasks assessed Behavior During Session: [facility] for tasks performed Cognition - Other Comments: intact.    Extremity/Trunk Assessment Right Upper Extremity Assessment RUE ROM/Strength/Tone: WFL for tasks assessed RUE Sensation: WFL - Light Touch RUE Coordination: WFL - gross/fine motor Left Upper Extremity Assessment LUE ROM/Strength/Tone: WFL for tasks assessed LUE Sensation: WFL - Light Touch LUE Coordination: WFL -  gross/fine motor Trunk Assessment Trunk Assessment: Normal   Mobility  Shoulder Instructions  Bed Mobility  Bed Mobility: Supine to Sit Supine to Sit: 7: Independent Transfers Transfers: Sit to Stand;Stand to Sit Sit to Stand: 5: Supervision;From chair/3-in-1 Stand to Sit: 5: Supervision;To chair/3-in-1 Details for Transfer Assistance: No physical assist needed.       Exercise     Balance     End of Session OT - End of Session Activity Tolerance: Patient tolerated treatment well Patient left: in bed;with call bell/phone within reach;with family/visitor present Nurse Communication: Mobility status  GO     Hope Budds 04/13/2012, 10:41 AM [phone]

## 2012-04-13 NOTE — Progress Notes (Addendum)
ANTICOAGULATION CONSULT NOTE - follow up  Pharmacy Consult for Coumadin Indication: atrial fibrillation  No Known Allergies   Labs:  Basename 04/13/12 0605 04/12/12 0550 04/11/12 0528  HGB 9.1* 8.9* --  HCT 28.5* 28.2* 28.7*  PLT 163 144* 132*  APTT -- -- --  LABPROT 14.9 -- --  INR 1.19 -- --  HEPARINUNFRC -- -- --  CREATININE 1.32* 1.19* 1.18*  CKTOTAL -- -- --  CKMB -- -- --  TROPONINI -- -- --    Estimated Creatinine Clearance: 52 ml/min (by C-G formula based on Cr of 1.32).   Assessment: On Coumadin PTA for AFib, now resumed s/p work up for anemia. No bridge therapy desired.   Dose PTA = 2.5 mg MWF, 5 mg TTSS GI work-up complete with no source of bleeding found Noted possibility of switching to Eliquis (apixaban).  The dose is 5 mg po bid unless she has any 2 of the following: age >=80, wt <=60kg, creat > = 1.5.  Today INR 1.32 after 1 dose of 5 mg given yesterday. .  Goal of Therapy:  INR 2-3    Plan:  1) Coumadin 5 mg po x 1 dose again tonight 2) Daily INR 3)Noted possibility of switching to Eliquis (apixaban).  The dose is 5 mg po bid unless she has any 2 of the following: age >=80, wt <=60kg, creat > = 1.5.  Herby Abraham, Pharm.D. [phone] 04/13/2012 10:23 AM

## 2012-04-13 NOTE — Discharge Summary (Addendum)
Physician Discharge Summary  Sharie Amorin ZOX:[id number] DOB: 1940-11-25 DOA: 04/07/2012  PCP: Michele Mcalpine, MD  Admit date: 04/07/2012 Discharge date: 04/13/2012  Recommendations for Outpatient Follow-up:  1. Follow up with primary MD. 2. Follow up with Dr [name], cardiologist.   Discharge Diagnoses:  Principal Problem:  *GI bleed Active Problems:  Morbid obesity  Atrial fibrillation, chronic  COPD  Acute blood loss anemia, due to GI bleed  CAD (coronary artery disease), with CABG in 1997 and negative Nuc. 01/2012  Anticoagulated on Coumadin, chronic for A. Fib  Blood in stool  Acute posthemorrhagic anemia  Coagulopathy - on chronic warfarin therapy for Afib  Nonspecific abnormal finding in stool contents   Discharge Condition: Satisfactory.   Diet recommendation: Heart-Healthy.   Filed Weights   04/11/12 0324 04/12/12 0500 04/13/12 0500  Weight: 107.7 kg (237 lb 7 oz) 107 kg (235 lb 14.3 oz) 111.585 kg (246 lb)    History of present illness:  71 year-old female with history of CAD status post CABG, atrial fibrillation on Coumadin, COPD, admitted to [facility] 04/07/2012 with progressively worsening generalized weakness associated with dizziness and 3 episodes of loss of consciousness, as well as shortness of breath and intermittent episodes of chest discomfort. She was found to be hypotensive in ED upon arrival with BP in 90's/40's. Hemoglobin in ED was 7.3 on arrival, down from hemoglobin of 12 in June 2013. In the ED FOBT was positive. Patient was transfused with 2 units of PRBC and FFP, also given vitamin K.   Hospital Course:  1. Acute vs chronic GIB:   Patient presented with progressive weakness and recurrent syncope. She was found to have a severe anemia and positive FOBT, against a background of Coumadin anticoagulation, for chronic atrial fibrillation. Dr Jeani Hawking provided initial GI consultation,and performed EGD on 04/09/12, which revealed no obvious source  of bleeding. Dr Claudette Head performed a colonoscopy on 04/12/12, which was also unrevealing. 2. Acute blood loss anemia:   Hemoglobin was 7.3 on presentation, against a known baselien hemoglobin of 12 in 12/2011. Patient had no melena or hematemesis, but as described above FOBT was positive. Patient was managed with 2 units of PRBC and FFP, as well as vitamin K. Anticoagulation was temporarily placed on hold. Hemoglobin has remained stable/reasonable since, and was 9.1 on 04/13/12.  3. Chronic afib with acute RVR:  Patient had a few episodes of rapid atrial fibrillation, and cardiology consultation was provided by Dr Nicki Guadalajara, who was very helpful in rationalizing anti-arrhythmic therapy. Patient is now rate-controlled.  4. Coumadin induced coagulopathy:   INR was 2.19 at presentation. Coumadin was temporarily held, as described above, and FFP/Vitamin K, administered. Coumadin was restarted on 04/12/12, without deleterious effect.  5. Hypokalemia:   Due to diuretic. Repleted as indicated.  6. CAD s/p CABG:  Stable/Asymptomatic. Cardiac enzymes remained unelevated. 7. COPD:   Stable on bronchodilators.  8. Suspected diastolic CHF +/- Cor Pulmonale:  No evidence of clinical decompensation during this hospitalization.  9. Chronic kidney disease:  Baseline Cr appears to be 1.3-1.5. Renal function is stable    Procedures:  See below.   Consultations:  Dr Nicki Guadalajara, cardiologist.  Dr Jeani Hawking, gastroenterologist.  Dr Claudette Head, gastroenterologist.   Discharge Exam: Filed Vitals:   04/13/12 0921 04/13/12 1139 04/13/12 1400 04/13/12 1413  BP:  119/62 136/48   Pulse: 108 109 94 102  Temp:   98.5 F (36.9 C)   TempSrc:   Oral   Resp: 20  20 20  Height:      Weight:      SpO2: 94%  94% 95%    General: Comfortable, alert, communicative, fully oriented, not short of breath at rest.  HEENT:  Mild clinical pallor, no jaundice, no conjunctival injection or discharge.  Hydration is fair.  NECK:  Supple, JVP not seen, no carotid bruits, no palpable lymphadenopathy, no palpable goiter. CHEST:  Clinically clear to auscultation, no wheezes, no crackles. HEART:  Sounds 1 and 2 heard, normal, irregular, no murmurs. ABDOMEN:  Moderately obese, soft, non-tender, no palpable organomegaly, no palpable masses, normal bowel sounds. GENITALIA:  Not examined. LOWER EXTREMITIES:  Mild pitting edema, palpable peripheral pulses. MUSCULOSKELETAL SYSTEM:  Generalized osteoarthritic changes, otherwise, normal. CENTRAL NERVOUS SYSTEM:  No focal neurologic deficit on gross examination.  Discharge Instructions      Discharge Orders    Future Appointments: Provider: Department: Dept Phone: Center:   04/28/2012 11:00 AM Julio Sicks, NP [facility] [phone] None   06/22/2012 10:00 AM Michele Mcalpine, MD [facility] [phone] None     Future Orders Please Complete By Expires   Diet - low sodium heart healthy      Increase activity slowly          Medication List     As of 04/13/2012  3:13 PM    STOP taking these medications         albuterol (2.5 MG/3ML) 0.083% nebulizer solution   Commonly known as: PROVENTIL      TAKE these medications         ALPRAZolam 0.5 MG tablet   Commonly known as: XANAX   Take 1 tablet (0.5 mg total) by mouth 4 (four) times daily as needed for anxiety.      amitriptyline 50 MG tablet   Commonly known as: ELAVIL   Take 2 tablets (100 mg total) by mouth at bedtime.      calcium-vitamin D 500-200 MG-UNIT per tablet   Take 1 tablet by mouth 2 (two) times daily with a meal.      cyclobenzaprine 10 MG tablet   Commonly known as: FLEXERIL   Take 20 mg by mouth 3 (three) times daily as needed. For muscle spasms      furosemide 40 MG tablet   Commonly known as: LASIX   Take 40 mg by mouth daily.      gabapentin 600 MG tablet   Commonly known as: NEURONTIN   Take 1 tablet (600 mg total) by mouth 4 (four) times  daily.      guaiFENesin 600 MG 12 hr tablet   Commonly known as: MUCINEX   Take 600 mg by mouth 2 (two) times daily.      isosorbide mononitrate 30 MG 24 hr tablet   Commonly known as: IMDUR   Take 30 mg by mouth at bedtime.      levalbuterol 0.63 MG/3ML nebulizer solution   Commonly known as: XOPENEX   Take 3 mLs (0.63 mg total) by nebulization every 6 (six) hours as needed for wheezing.      meclizine 25 MG tablet   Commonly known as: ANTIVERT   Take 1 by mouth every 4-6 hours as needed for dizziness      metoprolol tartrate 25 MG tablet   Commonly known as: LOPRESSOR   Take 3 tablets (75 mg total) by mouth 2 (two) times daily.      multivitamin with minerals Tabs   Take 1 tablet by mouth daily.  omeprazole 20 MG capsule   Commonly known as: PRILOSEC   Take 1 capsule (20 mg total) by mouth 2 (two) times daily.      oxyCODONE-acetaminophen 5-325 MG per tablet   Commonly known as: PERCOCET/ROXICET   Take 1 tablet by mouth every 4 (four) hours as needed. For pain      potassium chloride SA 20 MEQ tablet   Commonly known as: K-DUR,KLOR-CON   Take 1 tablet (20 mEq total) by mouth 2 (two) times daily.      promethazine 25 MG tablet   Commonly known as: PHENERGAN   Take 25 mg by mouth every 6 (six) hours as needed. For nausea      simethicone 80 MG chewable tablet   Commonly known as: MYLICON   Chew 80 mg by mouth every 6 (six) hours as needed. For upset stomach      simvastatin 20 MG tablet   Commonly known as: ZOCOR   Take 20 mg by mouth at bedtime.      vitamin B-12 1000 MCG tablet   Commonly known as: CYANOCOBALAMIN   Take 1,000 mcg by mouth daily.      warfarin 5 MG tablet   Commonly known as: COUMADIN   Take 2.5-5 mg by mouth See admin instructions. 5mg  Sunday, Tuesday, Thursday, and Saturday   2.5mg all other days         Follow-up Information    Follow up with BERRY,JONATHAN J, MD. On 04/18/2012. (11:50 am  for  INR Check)    Contact information:     [address] [place] [address] [phone]       Follow up with Abelino Derrick, PA. On 04/28/2012. (3:00PM)    Contact information:   [address] [place] [address] [phone]       Follow up with Michele Mcalpine, MD. On 04/28/2012. (at 1100 am with Rubye Oaks NP)    Contact information:   [facility] [address] [place] [address] [phone]           The results of significant diagnostics from this hospitalization (including imaging, microbiology, ancillary and laboratory) are listed below for reference.    Significant Diagnostic Studies: Dg Chest 2 View  04/07/2012  *RADIOLOGY REPORT*  Clinical Data: Chest pain.  CHEST - 2 VIEW  Comparison: Plain film chest 08/25/2011.  Findings: There is cardiomegaly and pulmonary vascular congestion. The patient is status post CABG.  No consolidative process, pneumothorax or effusion.  IMPRESSION: Cardiomegaly and vascular congestion.   Original Report Authenticated By: Bernadene Bell. Maricela Curet, M.D.    Dg Chest Port 1 View  04/11/2012  *RADIOLOGY REPORT*  Clinical Data: Shortness of breath.  PORTABLE CHEST - 1 VIEW  Comparison: 04/07/2012, 07/28/2011, and 01/03/2010  Findings: There is slight interstitial pulmonary edema as compared to multiple prior exams. Heart size is normal.  Slight pulmonary vascular prominence with distention of the azygos vein.  No effusions.  No acute osseous abnormality.  IMPRESSION: Slight interstitial pulmonary edema.   Original Report Authenticated By: Gwynn Burly, M.D.     Microbiology: Recent Results (from the past 240 hour(s))  MRSA PCR SCREENING     Status: Abnormal   Collection Time   04/08/12  5:54 AM      Component Value Range Status Comment   MRSA by PCR POSITIVE (*) NEGATIVE Final      Labs: Basic Metabolic Panel:  Lab 04/13/12 5643 04/12/12 0550 04/11/12  1610 04/10/12 0940 04/09/12 0450 04/08/12 0745  NA 137 139 138 141 138  --  K 4.8 4.7 2.7* 3.6 3.6 --  CL 99 101 95* 100 98 --  CO2 27 27 32 26 28 --  GLUCOSE 97 83 92 112* 99 --  BUN 17 17 19 19 22  --  CREATININE 1.32* 1.19* 1.18* 1.24* 1.34* --  CALCIUM 9.9 9.7 9.3 9.9 9.3 --  MG -- 2.1 -- -- -- 2.2  PHOS -- -- -- -- -- --   Liver Function Tests:  Lab 04/09/12 0450 04/08/12 0745 04/07/12 2158  AST 18 16 18   ALT 7 7 9   ALKPHOS 110 116 140*  BILITOT 1.6* 1.1 0.5  PROT 7.3 6.8 7.6  ALBUMIN 3.9 3.8 3.9   No results found for this basename: LIPASE:5,AMYLASE:5 in the last 168 hours No results found for this basename: AMMONIA:5 in the last 168 hours CBC:  Lab 04/13/12 0605 04/12/12 0550 04/11/12 0528 04/10/12 0940 04/09/12 0450 04/07/12 2158  WBC 4.6 4.9 5.4 7.2 5.3 --  NEUTROABS -- -- -- -- -- 3.1  HGB 9.1* 8.9* 9.1* 9.1* 8.6* --  HCT 28.5* 28.2* 28.7* 28.8* 27.5* --  MCV 92.8 94.0 93.8 93.5 94.2 --  PLT 163 144* 132* 130* 133* --   Cardiac Enzymes:  Lab 04/09/12 0759 04/09/12 0215 04/08/12 2020 04/08/12 1525 04/08/12 1524 04/08/12 0745  CKTOTAL -- -- -- -- 28 --  CKMB -- -- -- -- 1.7 --  CKMBINDEX -- -- -- -- -- --  TROPONINI <0.30 <0.30 <0.30 <0.30 -- <0.30   BNP: BNP (last 3 results)  Basename 12/22/11 1233  PROBNP 179.0*   CBG:  Lab 04/09/12 1628 04/09/12 1147 04/09/12 0725 04/09/12 0351 04/08/12 2317  GLUCAP 95 110* 99 105* 117*    Time coordinating discharge: 40 minutes  Signed:  Deseree Zemaitis,CHRISTOPHER  [name] 04/13/2012, 3:13 PM

## 2012-04-13 NOTE — Progress Notes (Signed)
[facility]  Subjective: No Complaints.  She ambulated in the hall without difficulty.  Objective: Vital signs in last 24 hours: Temp:  [98.1 F (36.7 C)-99.2 F (37.3 C)] 98.3 F (36.8 C) (09/25 0500) Pulse Rate:  [103-112] 108  (09/25 0921) Resp:  [16-20] 20  (09/25 0921) BP: (92-126)/(57-70) 92/62 mmHg (09/25 0500) SpO2:  [90 %-100 %] 94 % (09/25 0921) Weight:  [111.585 kg (246 lb)] 111.585 kg (246 lb) (09/25 0500) Last BM Date: 04/11/12  Intake/Output from previous day: 09/24 0701 - 09/25 0700 In: 480 [P.O.:480] Out: -  Intake/Output this shift: Total I/O In: 240 [P.O.:240] Out: -   Medications Current Facility-Administered Medications  Medication Dose Route Frequency Provider Last Rate Last Dose  . 0.9 %  sodium chloride infusion   Intravenous Continuous Lonia Blood, MD 10 mL/hr at 04/08/12 1034 10 mL/hr at 04/08/12 1034  . acetaminophen (TYLENOL) tablet 650 mg  650 mg Oral Q6H PRN Eduard Clos, MD   650 mg at 04/13/12 4540   Or  . acetaminophen (TYLENOL) suppository 650 mg  650 mg Rectal Q6H PRN Eduard Clos, MD      . ALPRAZolam Prudy Feeler) tablet 0.5 mg  0.5 mg Oral QID PRN Lonia Blood, MD   0.5 mg at 04/12/12 2217  . Chlorhexidine Gluconate Cloth 2 % PADS 6 each  6 each Topical Q0600 Lonia Blood, MD   6 each at 04/12/12 0600  . cyclobenzaprine (FLEXERIL) tablet 20 mg  20 mg Oral TID PRN Lonia Blood, MD   20 mg at 04/12/12 2217  . furosemide (LASIX) tablet 40 mg  40 mg Oral Daily Lonia Blood, MD   40 mg at 04/12/12 1021  . gabapentin (NEURONTIN) capsule 400 mg  400 mg Oral QID Lonia Blood, MD   400 mg at 04/12/12 2218  . hydrALAZINE (APRESOLINE) injection 10 mg  10 mg Intravenous Q4H PRN Lonia Blood, MD   10 mg at 04/09/12 1654  . ipratropium (ATROVENT) nebulizer solution 0.5 mg  0.5 mg Nebulization Q6H Lonia Blood, MD   0.5 mg at 04/13/12 0920  . isosorbide mononitrate (IMDUR)  24 hr tablet 30 mg  30 mg Oral QHS Lonia Blood, MD   30 mg at 04/12/12 2217  . levalbuterol (XOPENEX) nebulizer solution 0.63 mg  0.63 mg Nebulization Q2H PRN Lonia Blood, MD      . levalbuterol [facility]) nebulizer solution 0.63 mg  0.63 mg Nebulization Q6H Lonia Blood, MD   0.63 mg at 04/13/12 0921  . metoprolol (LOPRESSOR) injection 2.5 mg  2.5 mg Intravenous Q6H PRN Lonia Blood, MD      . metoprolol (LOPRESSOR) tablet 50 mg  50 mg Oral BID Lonia Blood, MD   50 mg at 04/12/12 2217  . morphine 2 MG/ML injection 2-4 mg  2-4 mg Intravenous Q2H PRN Lonia Blood, MD      . mupirocin ointment (BACTROBAN) 2 % 1 application  1 application Nasal BID Lonia Blood, MD   1 application at 04/12/12 2216  . ondansetron (ZOFRAN) tablet 4 mg  4 mg Oral Q6H PRN Eduard Clos, MD       Or  . ondansetron [facility]) injection 4 mg  4 mg Intravenous Q6H PRN Eduard Clos, MD      . oxyCODONE (Oxy IR/ROXICODONE) immediate release tablet 5-10 mg  5-10 mg Oral Q4H PRN Lonia Blood, MD  10 mg at 04/13/12 0233  . pantoprazole (PROTONIX) EC tablet 40 mg  40 mg Oral Q1200 Lonia Blood, MD   40 mg at 04/12/12 1138  . potassium chloride SA (K-DUR,KLOR-CON) CR tablet 40 mEq  40 mEq Oral TID Lonia Blood, MD   40 mEq at 04/12/12 1531  . vitamin B-12 (CYANOCOBALAMIN) tablet 1,000 mcg  1,000 mcg Oral Daily Lonia Blood, MD   1,000 mcg at 04/12/12 1021  . warfarin (COUMADIN) tablet 5 mg  5 mg Oral ONCE-1800 Dorothea Ogle, MD   5 mg at 04/12/12 1754  . warfarin (COUMADIN) tablet 5 mg  5 mg Oral ONCE-1800 Herby Abraham, PHARMD      . Warfarin - Pharmacist Dosing Inpatient   Does not apply [id number] Dorothea Ogle, MD        PE: General appearance: alert, cooperative and no distress Lungs: clear to auscultation bilaterally Heart: irregularly irregular rhythm Extremities: No LEE Pulses: 2+ and symmetric Skin: Warm and dry Neurologic: Grossly normal  Lab  Results:   Basename 04/13/12 0605 04/12/12 0550 04/11/12 0528  WBC 4.6 4.9 5.4  HGB 9.1* 8.9* 9.1*  HCT 28.5* 28.2* 28.7*  PLT 163 144* 132*   BMET  Basename 04/13/12 0605 04/12/12 0550 04/11/12 0528  NA 137 139 138  K 4.8 4.7 2.7*  CL 99 101 95*  CO2 27 27 32  GLUCOSE 97 83 92  BUN 17 17 19   CREATININE 1.32* 1.19* 1.18*  CALCIUM 9.9 9.7 9.3   PT/INR  Basename 04/13/12 0605  LABPROT 14.9  INR 1.19    Assessment/Plan  Principal Problem:  *GI bleed Active Problems:  Morbid obesity  Atrial fibrillation, chronic  COPD  Acute blood loss anemia, due to GI bleed  CAD (coronary artery disease), with CABG in 1997 and negative Nuc. 01/2012  Anticoagulated on Coumadin, chronic for A. Fib  Blood in stool  Acute posthemorrhagic anemia  Coagulopathy - on chronic warfarin therapy for Afib  Nonspecific abnormal finding in stool contents  Plan:  Chronic Afib. Rate ~100 with rest.   It increases to 140's with ambulation but quickly decreases.  Coumadin started yesterday.  No bridging.   S/P colonoscopy 9/23 with normal appearance.  HGB remains stable.   Lopressor 50mg , lasix 40mg  daily, Imdur 30.   Will arrange INR check for next Monday as well as post hospital follow-up.    LOS: 6 days    HAGER, BRYAN 04/13/2012 11:23 AM   Patient seen and examined. Agree with assessment and plan. Still not well beta-blocked; will increase Lopressor to 75 mg bid. No chest pain or dyspnea. Lennette Bihari, MD, [facility] 04/13/2012 12:07 PM

## 2012-04-15 ENCOUNTER — Inpatient Hospital Stay (HOSPITAL_COMMUNITY): Admission: RE | Admit: 2012-04-15 | Payer: Medicare Other | Source: Ambulatory Visit | Admitting: Specialist

## 2012-04-15 ENCOUNTER — Encounter (HOSPITAL_COMMUNITY): Admission: RE | Payer: Self-pay | Source: Ambulatory Visit

## 2012-04-15 SURGERY — ARTHROPLASTY, KNEE, TOTAL
Anesthesia: Spinal | Site: Knee | Laterality: Left

## 2012-04-26 ENCOUNTER — Other Ambulatory Visit: Payer: Self-pay | Admitting: Pulmonary Disease

## 2012-04-28 ENCOUNTER — Ambulatory Visit (INDEPENDENT_AMBULATORY_CARE_PROVIDER_SITE_OTHER): Payer: Medicare Other | Admitting: Adult Health

## 2012-04-28 ENCOUNTER — Encounter: Payer: Self-pay | Admitting: Adult Health

## 2012-04-28 ENCOUNTER — Other Ambulatory Visit (INDEPENDENT_AMBULATORY_CARE_PROVIDER_SITE_OTHER): Payer: Medicare Other

## 2012-04-28 VITALS — BP 120/62 | HR 74 | Temp 97.1°F | Ht 68.0 in | Wt 236.4 lb

## 2012-04-28 DIAGNOSIS — D62 Acute posthemorrhagic anemia: Secondary | ICD-10-CM

## 2012-04-28 DIAGNOSIS — N259 Disorder resulting from impaired renal tubular function, unspecified: Secondary | ICD-10-CM

## 2012-04-28 DIAGNOSIS — I4891 Unspecified atrial fibrillation: Secondary | ICD-10-CM

## 2012-04-28 DIAGNOSIS — K922 Gastrointestinal hemorrhage, unspecified: Secondary | ICD-10-CM

## 2012-04-28 LAB — BASIC METABOLIC PANEL
BUN: 16 mg/dL (ref 6–23)
CO2: 32 mEq/L (ref 19–32)
Calcium: 9.2 mg/dL (ref 8.4–10.5)
Chloride: 99 mEq/L (ref 96–112)
Creatinine, Ser: 1.6 mg/dL — ABNORMAL HIGH (ref 0.4–1.2)

## 2012-04-28 LAB — CBC WITH DIFFERENTIAL/PLATELET
Basophils Absolute: 0 10*3/uL (ref 0.0–0.1)
Basophils Relative: 0.8 % (ref 0.0–3.0)
Eosinophils Absolute: 0.2 10*3/uL (ref 0.0–0.7)
HCT: 34.7 % — ABNORMAL LOW (ref 36.0–46.0)
Hemoglobin: 11.3 g/dL — ABNORMAL LOW (ref 12.0–15.0)
Lymphocytes Relative: 30.5 % (ref 12.0–46.0)
Lymphs Abs: 1.6 10*3/uL (ref 0.7–4.0)
MCHC: 32.6 g/dL (ref 30.0–36.0)
Neutro Abs: 2.9 10*3/uL (ref 1.4–7.7)
RBC: 3.74 Mil/uL — ABNORMAL LOW (ref 3.87–5.11)
RDW: 17.9 % — ABNORMAL HIGH (ref 11.5–14.6)

## 2012-04-28 MED ORDER — ALBUTEROL SULFATE (2.5 MG/3ML) 0.083% IN NEBU: INHALATION_SOLUTION | RESPIRATORY_TRACT | Status: DC

## 2012-04-28 NOTE — Patient Instructions (Addendum)
Continue on current regimen.  I will call with labs  Call if see any signs of bleeding.  Follow up this evening with Dr. Allyson Sabal and coumadin clinic  Please contact office for sooner follow up if symptoms do not improve or worsen or seek emergency care  follow up Dr. Kriste Basque  In 2 months and As needed

## 2012-05-02 ENCOUNTER — Other Ambulatory Visit: Payer: Self-pay | Admitting: Pulmonary Disease

## 2012-05-03 ENCOUNTER — Telehealth: Payer: Self-pay | Admitting: Pulmonary Disease

## 2012-05-03 DIAGNOSIS — I4821 Permanent atrial fibrillation: Secondary | ICD-10-CM | POA: Insufficient documentation

## 2012-05-03 NOTE — Progress Notes (Signed)
Subjective:    Patient ID: [name], female    DOB: January 28, 1941, 71 y.o.   MRN: [id number]  HPI 71 y/o WF hx of ASHD w/ prev CABG and chr AFib on coumadin followed by DrBerry... she also has FM/ chr pain syndrome followed by [name]... we have followed her primarily for COPD/ Asthmatic Bronchitis- see below...  ~ Apr11:  she had knee surg 1/11 by [name]... then fell (tripped at home) 09/12/09 & hosp x4d> Xrays showed T4 compression... had f/u eval DrTooke w/ L2 compression, DDD, DJD> had epid steroid shots w/ coordination thru [facility] coumadin clinic, but developed large ecchymosis right post lumbar area... protime shows PT 67/ INR 6.6 & coumadin on hold per [facility]... we discussed the need for BMD testing & treatment> she will pursue this thru [facility] (prev DrRowe, now [employer]). Her breathing has been good since we added the SYMBICORT160> no exac, no need for antibiotics or prednisone... ~  Aug11:  she was hosp 6/11 by DrBerry for CP> no change in cath from 2009 w/ patent grafts, & meds adjusted w/ IMDUR started and she is improved... she is c/o bladder issues (?OB symptoms + urge incontinence etc)> we discussed trial of TOVIAZ 4mg  to start and appt w/ Urology for futher eval... mult other somatic complaints as well but breathing is stable;  unfortunately not able to lose signif weight despite trials of diets etc...  ~  Dec11:  70mo ROV c/o incr congestion, cough w/ green mucus, & wants Avelox... similar exac 9/11 treated by TP w/ Avelox, Pred, Mucinex & resolved... she states the Toviaz helped & she saw DrPeterson for Urology who tried something else but she likes the Toviaz better & requests refill...  ~  December 29, 2010:  170mo ROV & c/o recurrent URI symptoms w/ cough, incr SOB, sore left lat ribs, green sputum, congestion, etc; treated w/ Avelox x2, then Cipro & no better she says; we discussed how the color comes from the cellular elements in the sput & not germs per se; she is also c/o  tired all the time, fatigue, sick all the time, etc; we decided to wait for fever, ch in sputum & then culture it...    She saw DrBerry for [facility] 1/12> stable from the Cards standpoint & no changes made... Unfortunately her wt is up to 271# & she reminded me that she had Bariatric surg y[place] ([place] the 70's) & lost >100# but grad put it all back on... We checked her labs & everything is about the same x B12 is borderline at 279 & we will rec OTC Vit B12 supplement po qd...  ~  July 28, 2011:  70mo ROV & she has persistent AB symptoms despite Augmentin & Pred Rx called in> she knows that she must improve diet, exercise & get wt down... Also c/o aching all over, soreness, tender, HAs, etc c/w her FM & mult somatic complaints...  She saw DrBerry 10/12 for f/u CAD, s/p CABG 1997, AFib on Coumadin & rate control, Hyperlipidemia> she had false pos Myoview in 2009 & subseq cath that showed patent grafts & norm LVF; he felt she was stable & rec same meds, f/u 70yr... See prob list>>   ~  December 22, 2011:  70mo ROV & Doristine Bowers is c/o aches & pains from her FM; she saw [name] for Ortho & had shots in her knee but told ?she was too high risk for surg? We discussed this & I offered her  second opinion consults w/ Ortho specialists at [facility]) or [name]) if she wants...     She saw [name] for [facility] 2/13> chr AFib, on Coumadin & ASA81mg /d, plus Metop25Bid/ ImDur30/ Lasix40Bid;  He added in the LANOXIN 0.25mg /d to help her ventric response==> she did not bring her med bottles or an updated list of her meds, it is unclear if she is still taking the Lanoxin per [facility]...      We reviewed prob list, meds, xrays and labs> see below>> LABS 6/13:  FLP- at goals on Simva20;  Chems- ok w/ Creat=1.7;  CBC- ok;  TSH=1.46;  BNP=179; A1c=5.9  04/28/2012 Post Hospital follow up  Patient returns for a two-week, post hospital followup. Patient was admitted with progressive weakness, recurrent, syncope, and found to  have severe anemia, with positive occult stools. Her hemoglobin was 7.3 on admission. Patient received 2 units of packed red blood cells, along with fresh, frozen plasma, as well as vitamin K. She is on chronic Coumadin therapy for atrial fibrillation on admission. INR was 2.1. She was evaluated by a gastroenterology and underwent a EGD on 921, which revealed no obvious source of bleeding. She also underwent a colonoscopy, which was also unrevealing  Since discharge . She is feeling some improvement, but however, continues to feel weak. She denies any bloody stools, hematuria, chest pain, shortness of breath, or edema She has been restarted back on her Coumadin therapy And has followed up with Dr. Allyson Sabal . Unsure what her INR is right now.             Problem List:  COPD (ICD-496) - ex smoker, quit 51yrs... uses nebulizer w/ ALBUTEROL Qid (using 1-2 daily) vs Proventil HFA, SYMBICORT 160- 2spBid & MUCINEX 1-2 Bid w/ fluids... baseline CXR shows prev CABG surg, borderline Cor, NAD... prev PFT's 12/05 showed more restriction (from effort and obesity) than obstruction... ~  CXR 10/09 in hosp showed biapical pleural thickening, NAD. ~  5/10:  bronchitic exas treated w/ Levaquin/ Medrol/ etc... 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 ~  12/10:  another exac requiring Levaquin/ Pred... we will add SYMBICORT160- 2sp Bid. ~  Improved w/ addition of Symbicort, but still c/o occas exac requiring antibiotics etc... ~  CXR 6/11 showed mild cardiomeg, s/p CABG, calcif Ao, clear/ NAD.Marland Kitchen. ~  CXR 1-2/13 showed borderline heart size, s/p CABG, lungs clear w/ min peribronch thickening, DJD spine, osteopenia... ~  PFT 6/13 showed FVC=2.01 (59%), FEV1=1.62 (64%), %1sec=81, mid-flows=81%pred==> more restricted than obstructed; aske to contue meds & LOSE WEIGHT!  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 s/p CABG in 1997...  on IMDUR 30mg /d, METOPROLOL 50mg Bid, LASIX 40mg Bid, and  K20Bid... ~  NuclearStressTests in 8/05 showing infer scar and mild inferolat ischemia...  ~  2DEcho 10/07 showed EF=45-50% and DD...  ~  repeat Myoview in 9/08 showed no ischemia... ~  hosp 10/09 w/ CP- cath showed 90%proxLAD, normCIRC, subtotal midRCA, LIMA & 2 vein grafts patent;  LVF= 60% w/o regional wall motion abn... no change from 2004. ~  repeat cath 6/11 showed no change from 2009 and patent grafts... ~  Labs 6/13 look good & BNP= 179...  ATRIAL FIBRILLATION (ICD-427.31) - on COUMADIN per DrBerry... ~  4/11: complic after epidural shot w/ large ecchymosis right lumbar area, PT INR was >6, managed by [facility].  VENOUS INSUFFICIENCY (ICD-459.81) - she follows low sodium diet. elevates legs, wears support hose occas... she is on LASIX 40mg Bid per Cards... ~  9/12: [name] ordered VenDopplers- neg for DVT...  HYPERLIPIDEMIA (ICD-272.4) - on ZOCOR 20mg /d... ~  FLP 2/09 showed TChol 112, TG 76, HDL 43, LDL 54 ~  FLP 3/10 showed TChol 166, TG 137, HDL 40, LDL 98 ~  FLP 10/10 showed TChol 120, Tg 91, HDL 47, LDL 55 ~  12/11 & 1/61: not fasting for today's OV's... ~  4/12: note from [facility] showed TChol 99, TG 68, HDL 43, LDL 42 on Simva20. ~  FLP 0/96 on Simva20 showed TChol 110, TG 98, HDL 47, LDL 43 ~  FLP on 6/13 Simva20 showed TChol 104, TG 96, HDL 50, LDL 35  MORBID OBESITY (ICD-278.01) - we have discussed diet and exercise program on numerous occas in the past and again today! ~  weight 2/10 = 250# ~  weight 7/10 = 262# ~  weight 8/10 = 245# ~  weight 12/10 = 266# "it's a fighting battle" ~  weight 4/11 = 262# ~  weight 8/11 = 256# ~  weight 12/11 = 263#... we reviewed diet + exercise thereapy. ~  Weight 6/12 = 271# ~  Weight 1/13 = 266# ~  Weight 6/13 = 255#  GERD (ICD-530.81) - on OMEPRAZOLE 20mg Bid... ?[facility] changed to Aciphex?  IBS (ICD-564.1) - last colonoscopy was 9/07 by Dorris Singh and showed hems only... 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  RENAL  INSUFFICIENCY (ICD-588.9) - Creat in the 1.6-1.7 range... ~  labs 2/09 showed BUN= 16, Creat= 1.6 ~  labs 3/10 showed BUN= 20, Creat= 1.7 ~  labs 4/11 showed BUN= 15, Creat= 1.6 ~  labs in hosp 6/11 showed BUN= 9, Creat= 1.3 ~  Labs here 6/12 showed BUN= 21, Creat= 1.6 ~  Labs 1/13 on Lasix40Bid showed BUN=15, Creat= 1.7 ~  Labs 6/13 on Lasix40Bid showed BUN= 21, Creat= 1.7  OTHER SYMPTOMS INVOLVING URINARY SYSTEM (ICD-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 she responded to Toviaz 8mg  for overact bladder symptoms==> no longer on her med list...  DEGENERATIVE JOINT DISEASE >> LOW BACK PAIN, CHRONIC - eval and treated by Tamala Julian, now DrBeekman> "they do it all now" w/ rechecks Q3-61months... she takes OXYCODONE 5/325, NEURONTIN 600mg Bid, etc... COMPRESSION FRACTURE - found to have compression T12 & L2 4/11 treated by NS- DrTooke... CHRONIC PAIN SYNDROME - managed by Eligha Bridegroom... FIBROMYALGIA - on AMITRIPTYLINE 50mg - 2Qhs & FLEXERIL 10mg  Tid... PAGET'S DISEASE - on Observation per [facility], Rheumatology... ~  labs 2/09 showed AlkPhos= 79 ~  labs 3/10 showed AlkPhos= 86 ~  labs 4/11 showed AlkPhos= 70 ~  labs 6/11 in hosp showed AlkPhos = 71 ~  Bone Scan 6/11 showed stable Paget's distal left tibia, compress fx L2, OA of both knees. ~  Labs 6/13 showed AlkPhos= 86, remains wnl...  ANXIETY - on ALPRAZOLAM 0.5mg Qid...  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 Appendectomy   . Cholecystectomy   . Vesicovaginal fistula closure w/ tah   . Coronary artery bypass graft   . Cardiac catheterization   . Nissen fundoplication   . Abdominal hysterectomy   . Esophagogastroduodenoscopy 04/09/2012    Procedure: ESOPHAGOGASTRODUODENOSCOPY (EGD);  Surgeon: Theda Belfast, MD;  Location: [facility] ENDOSCOPY;  Service: Endoscopy;  Laterality: N/A;  tentatively scheduled per  Blase Mess due to patients breathing problems  . Colonoscopy 04/11/2012    Procedure: COLONOSCOPY;  Surgeon: Meryl Dare, MD,FACG;  Location: [facility] ENDOSCOPY;  Service:  Endoscopy;  Laterality: N/A;    Outpatient Encounter Prescriptions as of 04/28/2012  Medication Sig Dispense Refill  . ALPRAZolam (XANAX) 0.5 MG tablet Take 1 tablet (0.5 mg total) by mouth 4 (four) times daily as needed for anxiety.  120 tablet  5  . amitriptyline (ELAVIL) 50 MG tablet Take 2 tablets (100 mg total) by mouth at bedtime.  180 tablet  0  . Calcium Carbonate-Vitamin D (CALCIUM-VITAMIN D) 500-200 MG-UNIT per tablet Take 1 tablet by mouth 2 (two) times daily with a meal.      . cyclobenzaprine (FLEXERIL) 10 MG tablet Take 20 mg by mouth 3 (three) times daily as needed. For muscle spasms      . furosemide (LASIX) 40 MG tablet Take 40 mg by mouth daily.      Marland Kitchen gabapentin (NEURONTIN) 600 MG tablet TAKE 1 TABLET FOUR TIMES A DAY  360 tablet  2  . guaiFENesin (MUCINEX) 600 MG 12 hr tablet Take 600 mg by mouth 2 (two) times daily.       . isosorbide mononitrate (IMDUR) 30 MG 24 hr tablet Take 30 mg by mouth at bedtime.       . meclizine (ANTIVERT) 25 MG tablet Take 1 by mouth every 4-6 hours as needed for dizziness  50 tablet  0  . metoprolol (LOPRESSOR) 25 MG tablet Take 3 tablets (75 mg total) by mouth 2 (two) times daily.  60 tablet  0  . Multiple Vitamin (MULTIVITAMIN WITH MINERALS) TABS Take 1 tablet by mouth daily.      Marland Kitchen omeprazole (PRILOSEC) 20 MG capsule Take 1 capsule (20 mg total) by mouth 2 (two) times daily.  180 capsule  0  . oxyCODONE-acetaminophen (PERCOCET) 5-325 MG per tablet Take 1 tablet by mouth every 4 (four) hours as needed. For pain      . promethazine (PHENERGAN) 25 MG tablet Take 25 mg by mouth every 6 (six) hours as needed. For nausea      . simethicone (MYLICON) 80 MG chewable tablet Chew 80 mg by mouth every 6 (six) hours as needed. For upset stomach      . simvastatin (ZOCOR) 20 MG tablet  Take 20 mg by mouth at bedtime.        . vitamin B-12 (CYANOCOBALAMIN) 1000 MCG tablet Take 1,000 mcg by mouth daily.        Marland Kitchen warfarin (COUMADIN) 5 MG tablet Take 2.5-5 mg by mouth See admin instructions. 5mg  Sunday, Tuesday, Thursday, and Saturday   2.5mg  all other days      . DISCONTD: levalbuterol (XOPENEX) 0.63 MG/3ML nebulizer solution Take 3 mLs (0.63 mg total) by nebulization every 6 (six) hours as needed for wheezing.  30 mL  1  . DISCONTD: potassium chloride SA (KLOR-CON M20) 20 MEQ tablet Take 1 tablet (20 mEq total) by mouth 2 (two) times daily.  180 tablet  0  . albuterol (PROVENTIL) (2.5 MG/3ML) 0.083% nebulizer solution 1 vial in neb up to 4 times daily as needed for shortness of breath or wheezing1 vial in neb up to 4 times daily as needed for shortness of breath or wheezing  150 mL  PRN  . DISCONTD: albuterol (PROVENTIL) (2.5 MG/3ML) 0.083% nebulizer solution 1 vial in neb up to 4 times daily as needed for shortness of breath or wheezing        No Known Allergies  Current Medications, Allergies, Past Medical History, Past Surgical History, Family History, and Social History were reviewed in [employer]  medical record.    Review of Systems Constitutional:   No  weight loss, night sweats,  Fevers, chills,  +fatigue, or  lassitude.  HEENT:   No headaches,  Difficulty swallowing,  Tooth/dental problems, or  Sore throat,                No sneezing, itching, ear ache, nasal congestion, post nasal drip,   CV:  No chest pain,  Orthopnea, PND,   anasarca, dizziness, palpitations, syncope.   GI  No heartburn, indigestion, abdominal pain, nausea, vomiting, diarrhea, change in bowel habits, loss of appetite, bloody stools.   Resp:    No coughing up of blood.     No chest wall deformity  Skin: no rash or lesions.  GU: no dysuria, change in color of urine, no urgency or frequency.  No flank pain, no hematuria   MS:  No joint pain or swelling.     Psych:  No change  in mood or affect. No depression or anxiety.  No memory loss.              Objective:   Physical Exam     WD, Obese, 71 y/o WF in NAD... GENERAL:  Alert & oriented; pleasant & cooperative... HEENT:  [place]/AT,   EACs-clear, TMs-wnl, NOSE-clear, THROAT-clear & wnl. NECK:  Supple w/ fairROM; no JVD; normal carotid impulses w/o bruits; no thyromegaly or nodules palpated; no lymphadenopathy. CHEST:  Coarse BS w/ no wheezing, no rales, no signs of consolidation... HEART:  Regular Rhythm; without murmurs/ rubs/ or gallops heard... ABDOMEN:  Obese, soft & nontender; normal bowel sounds; no organomegaly or masses detected. EXT: without deformities, mild arthritic changes; no varicose veins/ +venous insuffic/ tr+ edema. NEURO:  no focal neuro deficits...    Assessment & Plan:

## 2012-05-03 NOTE — Telephone Encounter (Signed)
Called and spoke with pt and she is aware that SN recs for 2 nd opinion---either with ortho at [facility] or [phone].  Pt stated that she would like to check with her doctor first and will talk with her husband and call back if she decides to see another doctor for 2nd opinion.

## 2012-05-03 NOTE — Assessment & Plan Note (Signed)
GIB now resolved, ? Etiology while on coumadin  Unrevealing EGD and Colon per GI  Check cbc today  montior closely as she is now back on coumadin per cards

## 2012-05-03 NOTE — Progress Notes (Signed)
Quick Note:  Pt aware per 10.15.13 phone note. ______

## 2012-05-03 NOTE — Assessment & Plan Note (Signed)
Cont on current regimen per Cards  Follow coumadin use closely as recent GI bleed  INR at cards  Check cbc today

## 2012-05-03 NOTE — Telephone Encounter (Signed)
Result Note     Labs show improved h/h    Kidney fxn not as good, make sure she is drinking fluids    Repeat at return ov   Called spoke with patient, advised of 10.10.13 lab results/recs as stated by TP above.  Pt verbalized her understanding and denied any questions regarding lab work.  However, pt does request to know SN's recs regarding knee surgery.  Per 6.3.13 ov w/ SN:  ORTHO>  LBP/ Compression fx/ FM/ Paget's/ Chr Pain Syndrome> managed by [name] at [facility] he took over for [facility]; we discussed checking w/ DrBeekman before considering Ortho surg... ======================================== ~  December 22, 2011:  73mo ROV & [name] is c/o aches & pains from her FM; she saw [name] for Ortho & had shots in her knee but told ?she was too high risk for surg? We discussed this & I offered her second opinion consults w/ Ortho specialists at [facility]) or [name]) if she wants...      She saw [name] for [facility] 2/13> chr AFib, on Coumadin & ASA81mg /d, plus Metop25Bid/ ImDur30/ Lasix40Bid;  He added in the LANOXIN 0.25mg /d to help her ventric response==> she did not bring her med bottles or an updated list of her meds, it is unclear if she is still taking the Lanoxin per [facility]...      We reviewed prob list, meds, xrays and labs> see below>> LABS 6/13:  FLP- at goals on Simva20;  Chems- ok w/ Creat=1.7;  CBC- ok;  TSH=1.46;  BNP=179; A1c=5.9  [name], pt has upcoming ov 12.4.13 and a referral was never placed for the 2nd opinion w/ ortho specialists.  Would you like order placed, or discuss further at upcoming ov?  Thanks.

## 2012-05-16 ENCOUNTER — Other Ambulatory Visit: Payer: Self-pay | Admitting: Pulmonary Disease

## 2012-05-17 ENCOUNTER — Telehealth: Payer: Self-pay | Admitting: Pulmonary Disease

## 2012-05-17 NOTE — Telephone Encounter (Signed)
[name] is aware that I am going to fax the results to Dr. Thomasena Edis at [facility].

## 2012-05-23 ENCOUNTER — Encounter: Payer: Self-pay | Admitting: *Deleted

## 2012-05-24 ENCOUNTER — Ambulatory Visit (INDEPENDENT_AMBULATORY_CARE_PROVIDER_SITE_OTHER): Payer: Medicare Other | Admitting: Pulmonary Disease

## 2012-05-24 ENCOUNTER — Other Ambulatory Visit (INDEPENDENT_AMBULATORY_CARE_PROVIDER_SITE_OTHER): Payer: Medicare Other

## 2012-05-24 ENCOUNTER — Encounter: Payer: Self-pay | Admitting: Pulmonary Disease

## 2012-05-24 VITALS — BP 126/84 | HR 83 | Temp 99.0°F | Ht 68.0 in | Wt 244.8 lb

## 2012-05-24 DIAGNOSIS — E785 Hyperlipidemia, unspecified: Secondary | ICD-10-CM

## 2012-05-24 DIAGNOSIS — M25569 Pain in unspecified knee: Secondary | ICD-10-CM

## 2012-05-24 DIAGNOSIS — K219 Gastro-esophageal reflux disease without esophagitis: Secondary | ICD-10-CM

## 2012-05-24 DIAGNOSIS — G894 Chronic pain syndrome: Secondary | ICD-10-CM

## 2012-05-24 DIAGNOSIS — IMO0001 Reserved for inherently not codable concepts without codable children: Secondary | ICD-10-CM

## 2012-05-24 DIAGNOSIS — M545 Low back pain, unspecified: Secondary | ICD-10-CM

## 2012-05-24 DIAGNOSIS — J449 Chronic obstructive pulmonary disease, unspecified: Secondary | ICD-10-CM

## 2012-05-24 DIAGNOSIS — T148XXA Other injury of unspecified body region, initial encounter: Secondary | ICD-10-CM

## 2012-05-24 DIAGNOSIS — J4489 Other specified chronic obstructive pulmonary disease: Secondary | ICD-10-CM

## 2012-05-24 DIAGNOSIS — I2510 Atherosclerotic heart disease of native coronary artery without angina pectoris: Secondary | ICD-10-CM

## 2012-05-24 DIAGNOSIS — N259 Disorder resulting from impaired renal tubular function, unspecified: Secondary | ICD-10-CM

## 2012-05-24 DIAGNOSIS — F411 Generalized anxiety disorder: Secondary | ICD-10-CM

## 2012-05-24 DIAGNOSIS — M1990 Unspecified osteoarthritis, unspecified site: Secondary | ICD-10-CM

## 2012-05-24 DIAGNOSIS — I4891 Unspecified atrial fibrillation: Secondary | ICD-10-CM

## 2012-05-24 DIAGNOSIS — K589 Irritable bowel syndrome without diarrhea: Secondary | ICD-10-CM

## 2012-05-24 DIAGNOSIS — D62 Acute posthemorrhagic anemia: Secondary | ICD-10-CM

## 2012-05-24 LAB — IBC PANEL
Iron: 88 ug/dL (ref 42–145)
Transferrin: 297.6 mg/dL (ref 212.0–360.0)

## 2012-05-24 LAB — BASIC METABOLIC PANEL
BUN: 25 mg/dL — ABNORMAL HIGH (ref 6–23)
Calcium: 9.1 mg/dL (ref 8.4–10.5)
Creatinine, Ser: 1.8 mg/dL — ABNORMAL HIGH (ref 0.4–1.2)
GFR: 30.09 mL/min — ABNORMAL LOW (ref >60.00)

## 2012-05-24 LAB — CBC WITH DIFFERENTIAL/PLATELET
Basophils Absolute: 0.1 10*3/uL (ref 0.0–0.1)
Eosinophils Relative: 5.8 % — ABNORMAL HIGH (ref 0.0–5.0)
Monocytes Relative: 9.4 % (ref 3.0–12.0)
Neutrophils Relative %: 48.3 % (ref 43.0–77.0)
Platelets: 201 10*3/uL (ref 150.0–400.0)
WBC: 6.2 10*3/uL (ref 4.5–10.5)

## 2012-05-24 NOTE — Patient Instructions (Addendum)
Today we updated your med list in our EPIC system...    Continue your current medications the same...  Today we did your follow up blood work 7 we will contact you w/ the results...    We will also send a note to [name]...  Call for any questions...  Let's plan a follow up visit in about 4  Months.Marland KitchenMarland Kitchen

## 2012-05-24 NOTE — Progress Notes (Signed)
Subjective:    Patient ID: [name], female    DOB: [birth year], 71 y.o.   MRN: [id number]  HPI 71 y/o WF here for a follow up visit... she has ASHD w/ prev CABG and chr AFib on Coumadin followed by DrBerry... she also has FM/ chr pain syndrome followed by [name]... we have followed her primarily for COPD/ Asthmatic Bronchitis- see below...  ~ Apr11:  she had knee surg 1/11 by [name]... then fell (tripped at home) 09/12/09 & hosp x4d> Xrays showed T4 compression... had f/u eval DrTooke w/ additional L2 compression, DDD, DJD> had epid steroid shots w/ coordination thru [facility] coumadin clinic, but developed large ecchymosis right post lumbar area... protime shows PT 67/ INR 6.6 & coumadin on hold per [facility]... we discussed the need for BMD testing & treatment> she will pursue this thru [facility] (prev DrRowe, now [employer]). Her breathing has been good since we added the SYMBICORT160> no exac, no need for antibiotics or prednisone... ~  Aug11:  she was hosp 6/11 by DrBerry for CP> no change in cath from 2009 w/ patent grafts, & meds adjusted w/ IMDUR started and she is improved... she is c/o bladder issues (?OB symptoms + urge incontinence etc)> we discussed trial of TOVIAZ 4mg  to start and appt w/ Urology for futher eval... mult other somatic complaints as well but breathing is stable;  unfortunately not able to lose signif weight despite trials of diets etc...  ~  Dec11:  344mo ROV c/o incr congestion, cough w/ green mucus, & wants Avelox... similar exac 9/11 treated by TP w/ Avelox, Pred, Mucinex & resolved... she states the Toviaz helped & she saw DrPeterson for Urology who tried something else but she likes the Toviaz better & requests refill...  ~  December 29, 2010:  60mo ROV & c/o recurrent URI symptoms w/ cough, incr SOB, sore left lat ribs, green sputum, congestion, etc; treated w/ Avelox x2, then Cipro & no better she says; we discussed how the color comes from the cellular elements in the  sput & not germs per se; she is also c/o tired all the time, fatigue, sick all the time, etc; we decided to wait for fever, ch in sputum & then culture it...    She saw DrBerry for [facility] 1/12> stable from the Cards standpoint & no changes made... Unfortunately her wt is up to 271# & she reminded me that she had Bariatric surg yrs ago ([place] the 70's) & lost >100# but grad put it all back on... We checked her labs & everything is about the same x B12 is borderline at 279 & we will rec OTC Vit B12 supplement po qd...  ~  July 28, 2011:  29mo ROV & she has persistent AB symptoms despite Augmentin & Pred Rx called in> she knows that she must improve diet, exercise & get wt down... Also c/o aching all over, soreness, tender, HAs, etc c/w her FM & mult somatic complaints...  She saw DrBerry 10/12 for f/u CAD, s/p CABG 1997, AFib on Coumadin & rate control, Hyperlipidemia> she had false pos Myoview in 2009 & subseq cath that showed patent grafts & norm LVF; he felt she was stable & rec same meds, f/u 24yr... See prob list>>   ~  December 22, 2011:  44mo ROV & [name] is c/o aches & pains from her FM; she saw [name] for Ortho & had shots in her knee but told ?she was too high risk for surg?  We discussed this & I offered her second opinion consults w/ Ortho specialists at [facility]) or [name]) if she wants...     She saw [name] for [facility] 2/13> chr AFib, on Coumadin & ASA81mg /d, plus Metop25Bid/ ImDur30/ Lasix40Bid;  He added in the LANOXIN 0.25mg /d to help her ventric response==> she did not bring her med bottles or an updated list of her meds, it is unclear if she is still taking the Lanoxin per [facility]...      We reviewed prob list, meds, xrays and labs> see below>> LABS 6/13:  FLP- at goals on Simva20;  Chems- ok w/ Creat=1.7;  CBC- ok;  TSH=1.46;  BNP=179; A1c=5.9  ~  May 24, 2012:  63mo ROV & [name] was [place] by [facility] 9/19 - 04/13/12 for weakness, dizzy, syncope in the setting of hypotension &  acute anemia w/ GIbleed while on Coumadin> he specific numbers were BP=90/40, Hg=7.3, stools pos for blood, INR=2.19;  She received 2u blood, FFP, VitK;  She had EGD= neg (intact Nissen) & Colonoscopy= neg, no bleeding source found;  Coumadin was help then restarted;  Only med change was incr Metoprolol 25mg  tabs- take 3tabs Bid...  Since disch she saw TP 10/13> doing satis & Hg= 11.3;  She had f/u [facility] 10/13> stable chr AFib w/ controlled VR, same meds, no changes made...    COPD> on AlbutNEBS, Symbicort160, Mucinex; breating is stable & chest clear at present, RR=18, O2sat=94% on RA...    Cards> CAD, s/p CABG, chr AFib> on Imdur, Metoprolol, Lasix, KCl, Coumadin; [facility] & [facility] notes are reviewed...    Chol> on Simva20; last FLP at goals- continue same, get on diet, get wt down...    Obesity> she has gone 255 ==> 236 ==> 245 today; we reviewed diet, exercise, wt reduction strategies...    Renal Insuffic> prev labs in the 1.5 to 1.7 range, sl better during her Hosp reading 1.2 to 1.3, but now back to 1.6=>1.8 on her same outpt meds...    Rheum> DJD, Compression T4 & L2, chr pain syndrome, FM> she is followed by [name], DrTooke, [name]; on Percocet Tid Prn & Neurontin 600mg Bid;  Also takes Amitriptyline 50mg - 2hs & Flexeril 10 Tid Prn...    Anxiety> on Alprazolam 0.5mg  Tid as needed... We reviewed prob list, meds, xrays and labs> see below for updates >> she had Flu vaccine 9/13... LABS 11/13:  Chems-  Ok x mild renal insuffic w/ BUN=25, Creat=1.8;  CBC- mild anemia w/ Hg=10.7, Fe=88 (21%)...           Problem List:  COPD (ICD-496) - ex smoker, quit 45yrs... uses nebulizer w/ ALBUTEROL Qid (using 1-2 daily) vs Proventil HFA, SYMBICORT 160- 2spBid & MUCINEX 1-2 Bid w/ fluids... baseline CXR shows prev CABG surg, borderline Cor, NAD... prev PFT's 12/05 showed more restriction (from effort and obesity) than obstruction... ~  CXR 10/09 in hosp showed biapical pleural thickening,  NAD. ~  5/10:  bronchitic exas treated w/ Levaquin/ Medrol/ etc... 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 ~  12/10:  another exac requiring Levaquin/ Pred... we will add SYMBICORT160- 2sp Bid. ~  Improved w/ addition of Symbicort, but still c/o occas exac requiring antibiotics etc... ~  CXR 6/11 showed mild cardiomeg, s/p CABG, calcif Ao, clear/ NAD.Marland Kitchen. ~  CXR 1-2/13 showed borderline heart size, s/p CABG, lungs clear w/ min peribronch thickening, DJD spine, osteopenia... ~  PFT 6/13 showed FVC=2.01 (59%), FEV1=1.62 (64%), %1sec=81, mid-flows=81%pred==> more restricted than  obstructed; aske to contue meds & LOSE WEIGHT! ~  CXR 9/13 showed normal heart size, s/p CABG, sl pulm vasc congestion, NAD...  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 s/p CABG in 1997...  on IMDUR 30mg /d, METOPROLOL 25mg -3Bid, LASIX 40mg Bid, and K20Bid... ~  NuclearStressTests in 8/05 showing infer scar and mild inferolat ischemia...  ~  2DEcho 10/07 showed EF=45-50% and DD...  ~  repeat Myoview in 9/08 showed no ischemia... ~  hosp 10/09 w/ CP- cath showed 90%proxLAD, normCIRC, subtotal midRCA, LIMA & 2 vein grafts patent;  LVF= 60% w/o regional wall motion abn... no change from 2004. ~  repeat cath 6/11 showed no change from 2009 and patent grafts... ~  Labs 6/13 look good & BNP= 179... ~  [facility] 9/13 w/ anemia & GI bleed > labs reviewed; DrKelly incr her Metoprolol to 75mg  Bid...  ATRIAL FIBRILLATION (ICD-427.31) - on COUMADIN per DrBerry... ~  4/11: complic after epidural shot w/ large ecchymosis right lumbar area, PT INR was >6, managed by [facility]. ~  9/13:  Hosp by [facility] w/ anemia (Hg=7.3), pos stools, INR=2.19; Coumadin held transiently, Tx 2u +FFP & VitK; EGD & Colon neg; Coumadin restarted; DrKelly increased her Metoprolol to 75mg Bid...  VENOUS INSUFFICIENCY (ICD-459.81) - she follows low sodium diet. elevates legs, wears support hose occas... she is on LASIX 40mg Bid per Cards... ~   9/12: [name] ordered VenDopplers- neg for DVT...  HYPERLIPIDEMIA (ICD-272.4) - on ZOCOR 20mg /d... ~  FLP 2/09 showed TChol 112, TG 76, HDL 43, LDL 54 ~  FLP 3/10 showed TChol 166, TG 137, HDL 40, LDL 98 ~  FLP 10/10 showed TChol 120, Tg 91, HDL 47, LDL 55 ~  12/11 & 9/52: not fasting for today's OV's... ~  4/12: note from [facility] showed TChol 99, TG 68, HDL 43, LDL 42 on Simva20. ~  FLP 8/41 on Simva20 showed TChol 110, TG 98, HDL 47, LDL 43 ~  FLP on 6/13 Simva20 showed TChol 104, TG 96, HDL 50, LDL 35  MORBID OBESITY (ICD-278.01) - we have discussed diet and exercise program on numerous occas in the past and again today! ~  weight 2/10 = 250# ~  weight 7/10 = 262# ~  weight 8/10 = 245# ~  weight 12/10 = 266# "it's a fighting battle" ~  weight 4/11 = 262# ~  weight 8/11 = 256# ~  weight 12/11 = 263#... we reviewed diet + exercise thereapy. ~  Weight 6/12 = 271# ~  Weight 1/13 = 266# ~  Weight 6/13 = 255# ~  Weight 11/13 = 245#  GERD (ICD-530.81) - on OMEPRAZOLE 20mg Bid... ?[facility] changed to Aciphex?  IBS (ICD-564.1) - last colonoscopy was 9/07 by Dorris Singh and showed hems only... 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  RENAL INSUFFICIENCY (ICD-588.9) - Creat in the 1.6-1.7 range... ~  labs 2/09 showed BUN= 16, Creat= 1.6 ~  labs 3/10 showed BUN= 20, Creat= 1.7 ~  labs 4/11 showed BUN= 15, Creat= 1.6 ~  labs in hosp 6/11 showed BUN= 9, Creat= 1.3 ~  Labs here 6/12 showed BUN= 21, Creat= 1.6 ~  Labs 1/13 on Lasix40Bid showed BUN=15, Creat= 1.7 ~  Labs 6/13 on Lasix40Bid showed BUN= 21, Creat= 1.7 ~  Labs 9/13 in hosp showed Creat= 1.2-1.3 ~  Labs 11/13 on Lasix40Bid showed BUN= 25, Creat= 1.8  OTHER SYMPTOMS INVOLVING URINARY SYSTEM (ICD-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 she responded to Toviaz 8mg   for overact bladder symptoms==> no longer on her med list...  DEGENERATIVE JOINT DISEASE >> LOW BACK PAIN, CHRONIC  - eval and treated by Tamala Julian, now DrBeekman> "they do it all now" w/ rechecks Q3-12months... she takes OXYCODONE 5/325, NEURONTIN 600mg Bid, etc... COMPRESSION FRACTURE - found to have compression T12 & L2 4/11 treated by NS- DrTooke... CHRONIC PAIN SYNDROME - managed by Eligha Bridegroom... FIBROMYALGIA - on AMITRIPTYLINE 50mg - 2Qhs & FLEXERIL 10mg  Tid... PAGET'S DISEASE - on Observation per [facility], Rheumatology... ~  labs 2/09 showed AlkPhos= 79 ~  labs 3/10 showed AlkPhos= 86 ~  labs 4/11 showed AlkPhos= 70 ~  labs 6/11 in hosp showed AlkPhos = 71 ~  Bone Scan 6/11 showed stable Paget's distal left tibia, compress fx L2, OA of both knees. ~  Labs 6/13 showed AlkPhos= 86, remains wnl...  ANXIETY - on ALPRAZOLAM 0.5mg Qid prn...  Hx of HERPES ZOSTER (ICD-053.9), & POSTHERPETIC NEURALGIA (ICD-053.19)  ANEMIA >>  ~  Labs 11/13 showed Hg=10.7, MCV=92, Fe=88 (21%sat)...   Past Surgical History  Procedure Date  . Appendectomy   . Cholecystectomy   . Vesicovaginal fistula closure w/ tah   . Coronary artery bypass graft   . Cardiac catheterization   . Nissen fundoplication   . Abdominal hysterectomy   . Esophagogastroduodenoscopy 04/09/2012    Procedure: ESOPHAGOGASTRODUODENOSCOPY (EGD);  Surgeon: Theda Belfast, MD;  Location: [facility] ENDOSCOPY;  Service: Endoscopy;  Laterality: N/A;  tentatively scheduled per Blase Mess due to patients breathing problems  . Colonoscopy 04/11/2012    Procedure: COLONOSCOPY;  Surgeon: Meryl Dare, MD,FACG;  Location: [facility] ENDOSCOPY;  Service: Endoscopy;  Laterality: N/A;    Outpatient Encounter Prescriptions as of 05/24/2012  Medication Sig Dispense Refill  . ALPRAZolam (XANAX) 0.5 MG tablet Take 1 tablet (0.5 mg total) by mouth 4 (four) times daily as needed for anxiety.  120 tablet  5  . amitriptyline (ELAVIL) 50 MG tablet Take 2 tablets (100 mg total) by mouth at bedtime.  180 tablet  0  . Calcium Carbonate-Vitamin D (CALCIUM-VITAMIN D) 500-200  MG-UNIT per tablet Take 1 tablet by mouth 2 (two) times daily with a meal.      . cyclobenzaprine (FLEXERIL) 10 MG tablet Take 20 mg by mouth 3 (three) times daily as needed. For muscle spasms      . furosemide (LASIX) 40 MG tablet Take 40 mg by mouth daily.      Marland Kitchen gabapentin (NEURONTIN) 600 MG tablet TAKE 1 TABLET FOUR TIMES A DAY  360 tablet  2  . guaiFENesin (MUCINEX) 600 MG 12 hr tablet Take 600 mg by mouth 2 (two) times daily.       . isosorbide mononitrate (IMDUR) 30 MG 24 hr tablet Take 30 mg by mouth at bedtime.       Marland Kitchen KLOR-CON M20 20 MEQ tablet TAKE 1 TABLET TWICE A DAY  180 tablet  3  . meclizine (ANTIVERT) 25 MG tablet Take 1 by mouth every 4-6 hours as needed for dizziness  50 tablet  0  . metoprolol tartrate (LOPRESSOR) 25 MG tablet Take 50 mg by mouth 2 (two) times daily.      . Multiple Vitamin (MULTIVITAMIN WITH MINERALS) TABS Take 1 tablet by mouth daily.      Marland Kitchen omeprazole (PRILOSEC) 20 MG capsule Take 1 capsule (20 mg total) by mouth 2 (two) times daily.  180 capsule  0  . oxyCODONE-acetaminophen (PERCOCET) 5-325 MG per tablet Take 1 tablet by mouth every 4 (four) hours  as needed. For pain      . promethazine (PHENERGAN) 25 MG tablet Take 25 mg by mouth every 6 (six) hours as needed. For nausea      . simethicone (MYLICON) 80 MG chewable tablet Chew 80 mg by mouth every 6 (six) hours as needed. For upset stomach      . simvastatin (ZOCOR) 20 MG tablet Take 20 mg by mouth at bedtime.        . vitamin B-12 (CYANOCOBALAMIN) 1000 MCG tablet Take 1,000 mcg by mouth daily.        Marland Kitchen warfarin (COUMADIN) 5 MG tablet Take 2.5-5 mg by mouth See admin instructions. 5mg  Sunday, Tuesday, Thursday, and Saturday   2.5mg  all other days      . [DISCONTINUED] metoprolol (LOPRESSOR) 25 MG tablet Take 3 tablets (75 mg total) by mouth 2 (two) times daily.  60 tablet  0  . albuterol (PROVENTIL) (2.5 MG/3ML) 0.083% nebulizer solution 1 vial in neb up to 4 times daily as needed for shortness of breath or  wheezing1 vial in neb up to 4 times daily as needed for shortness of breath or wheezing  150 mL  PRN  . [DISCONTINUED] albuterol (PROVENTIL) (2.5 MG/3ML) 0.083% nebulizer solution USE IN NEB TREATMENT UP TO FOUR TIMES A DAY AS NEEDED FOR WHEEZING...  150 mL  3    No Known Allergies  Current Medications, Allergies, Past Medical History, Past Surgical History, Family History, and Social History were reviewed in [employer] record.    Review of Systems         See HPI - all other systems neg except as noted... The patient complains of decreased hearing, dyspnea on exertion, headaches, incontinence, and depression.  The patient denies anorexia, fever, weight loss, weight gain, vision loss, hoarseness, chest pain, syncope, peripheral edema, prolonged cough, hemoptysis, abdominal pain, melena, hematochezia, severe indigestion/heartburn, hematuria, muscle weakness, suspicious skin lesions, transient blindness, difficulty walking, unusual weight change, abnormal bleeding, enlarged lymph nodes, and angioedema.    Objective:   Physical Exam     WD, Obese, 71 y/o WF in NAD... GENERAL:  Alert & oriented; pleasant & cooperative... HEENT:  /AT, EOM- full, PERRLA, EACs-clear, TMs-wnl, NOSE-clear, THROAT-clear & wnl. NECK:  Supple w/ fairROM; no JVD; normal carotid impulses w/o bruits; no thyromegaly or nodules palpated; no lymphadenopathy. CHEST:  Coarse BS w/ no wheezing, no rales, no signs of consolidation... HEART:  Regular Rhythm; without murmurs/ rubs/ or gallops heard... ABDOMEN:  Obese, soft & nontender; normal bowel sounds; no organomegaly or masses detected. EXT: without deformities, mild arthritic changes; no varicose veins/ +venous insuffic/ 1+ edema. NEURO:  no focal neuro deficits... DERM:  along right lateral chest wall, under breast & over the costal margin- scarring in skin from shingles rash...  RADIOLOGY DATA:  Reviewed in the EPIC EMR & discussed w/ the  patient...  LABORATORY DATA:  Reviewed in the EPIC EMR & discussed w/ the patient...   Assessment & Plan:    COPD>  Stable overall despite her chronic symptoms> continue NEBS, Symbicort, Mucinex, Fluids;  I told her no more antibiotics w/o approp culture data...  ASHD/ CAD/ s/p CABG>  Followed by Marland [facility]cal[facility]; continue same meds; they have given cardiac clearance for surg...  AFIB>  On Coumadin per [facility], continue same Rx...  HYPERLIPIDEMIA>  FLP looks great, continue Simva20...  OBESITY>  We reviewed diet & exercise program; she must be more successful at losing weight!!!  GI [facility] 9/13  w/ Anemia & GI bleed; neg EGD & Colon, no source found...  Renal Insuffic>  Creat= 1.8 on her current regimen; advised to drink plenty of water & maintain hydration...  ORTHO>  LBP/ Compression fx/ FM/ Paget's/ Chr Pain Syndrome> managed by [name] at [facility] he took over for [facility]; we discussed checking w/ DrBeekman before considering Ortho surg...  Anxiety>  On Alpraz prn...  Anemia>  She was Tx 2u in hosp 9/13; not currently on Fe therapy; Labs showed Hg= 10.7.Marland KitchenMarland Kitchen   Patient's Medications  New Prescriptions   No medications on file  Previous Medications   ALBUTEROL (PROVENTIL) (2.5 MG/3ML) 0.083% NEBULIZER SOLUTION    1 vial in neb up to 4 times daily as needed for shortness of breath or wheezing1 vial in neb up to 4 times daily as needed for shortness of breath or wheezing   ALPRAZOLAM (XANAX) 0.5 MG TABLET    Take 1 tablet (0.5 mg total) by mouth 4 (four) times daily as needed for anxiety.   AMITRIPTYLINE (ELAVIL) 50 MG TABLET    Take 2 tablets (100 mg total) by mouth at bedtime.   CALCIUM CARBONATE-VITAMIN D (CALCIUM-VITAMIN D) 500-200 MG-UNIT PER TABLET    Take 1 tablet by mouth 2 (two) times daily with a meal.   CYCLOBENZAPRINE (FLEXERIL) 10 MG TABLET    Take 20 mg by mouth 3 (three) times daily as needed. For muscle spasms   FUROSEMIDE (LASIX) 40 MG TABLET    Take 40 mg by  mouth daily.   GABAPENTIN (NEURONTIN) 600 MG TABLET    TAKE 1 TABLET FOUR TIMES A DAY   GUAIFENESIN (MUCINEX) 600 MG 12 HR TABLET    Take 600 mg by mouth 2 (two) times daily.    ISOSORBIDE MONONITRATE (IMDUR) 30 MG 24 HR TABLET    Take 30 mg by mouth at bedtime.    KLOR-CON M20 20 MEQ TABLET    TAKE 1 TABLET TWICE A DAY   MECLIZINE (ANTIVERT) 25 MG TABLET    Take 1 by mouth every 4-6 hours as needed for dizziness   MULTIPLE VITAMIN (MULTIVITAMIN WITH MINERALS) TABS    Take 1 tablet by mouth daily.   OMEPRAZOLE (PRILOSEC) 20 MG CAPSULE    Take 1 capsule (20 mg total) by mouth 2 (two) times daily.   OXYCODONE-ACETAMINOPHEN (PERCOCET) 5-325 MG PER TABLET    Take 1 tablet by mouth every 4 (four) hours as needed. For pain   PROMETHAZINE (PHENERGAN) 25 MG TABLET    Take 25 mg by mouth every 6 (six) hours as needed. For nausea   SIMETHICONE (MYLICON) 80 MG CHEWABLE TABLET    Chew 80 mg by mouth every 6 (six) hours as needed. For upset stomach   SIMVASTATIN (ZOCOR) 20 MG TABLET    Take 20 mg by mouth at bedtime.     VITAMIN B-12 (CYANOCOBALAMIN) 1000 MCG TABLET    Take 1,000 mcg by mouth daily.     WARFARIN (COUMADIN) 5 MG TABLET    Take 2.5-5 mg by mouth See admin instructions. 5mg  Sunday, Tuesday, Thursday, and Saturday   2.5mg  all other days  Modified Medications   Modified Medication Previous Medication   METOPROLOL TARTRATE (LOPRESSOR) 25 MG TABLET metoprolol (LOPRESSOR) 25 MG tablet      Take 50 mg by mouth 2 (two) times daily.    Take 3 tablets (75 mg total) by mouth 2 (two) times daily.  Discontinued Medications

## 2012-05-30 ENCOUNTER — Telehealth: Payer: Self-pay | Admitting: Pulmonary Disease

## 2012-05-30 NOTE — Telephone Encounter (Signed)
[facility] closed at 5pm...need to contact in the morning in regards to getting a letter of surgical clearance to someone at the [facility] office [phone]. Need to find out if they will accept the actual office visit note stating she is cleared or if they need a formal letter typed and faxed. They are stating that they never received a letter of clearance and need one asap.   Message to be handled 05/31/12 in the morning.

## 2012-05-31 ENCOUNTER — Other Ambulatory Visit: Payer: Self-pay | Admitting: Pulmonary Disease

## 2012-05-31 NOTE — Telephone Encounter (Signed)
SN please advise if ok to send over ov note to clear pt for sugery?   Thanks  No Known Allergies

## 2012-05-31 NOTE — Telephone Encounter (Signed)
Clydie Braun of [facility] called & sated that an OV note stating pt is cleared for surgery will suffice.  Requests this be faxed to [phone].  Clydie Braun states this will go directly to her, so need for a cover sheet or anything.  Antionette Fairy

## 2012-05-31 NOTE — Telephone Encounter (Signed)
LMTCB for Clydie Braun at [employer].

## 2012-06-06 ENCOUNTER — Telehealth: Payer: Self-pay | Admitting: Pulmonary Disease

## 2012-06-06 DIAGNOSIS — D62 Acute posthemorrhagic anemia: Secondary | ICD-10-CM

## 2012-06-06 NOTE — Telephone Encounter (Signed)
Per SN---ok to come in for cbcd and protime.  thanks

## 2012-06-06 NOTE — Telephone Encounter (Signed)
Order has been placed and pt aware. Nothing further was needed

## 2012-06-06 NOTE — Telephone Encounter (Signed)
I spoke with pt and she stated she "blacked out" for a second on Friday. She states she is wanting to have her blood count checked again. She feels like she is losing blood somewhere. She had this checked last OV and showed mild anemia. Please advise SN thanks  Last OV 05/24/12 Pending 09/23/12

## 2012-06-07 ENCOUNTER — Other Ambulatory Visit (INDEPENDENT_AMBULATORY_CARE_PROVIDER_SITE_OTHER): Payer: Medicare Other

## 2012-06-07 ENCOUNTER — Other Ambulatory Visit: Payer: Self-pay | Admitting: Pulmonary Disease

## 2012-06-07 DIAGNOSIS — D62 Acute posthemorrhagic anemia: Secondary | ICD-10-CM

## 2012-06-07 LAB — CBC WITH DIFFERENTIAL/PLATELET
Basophils Relative: 1.2 % (ref 0.0–3.0)
Eosinophils Absolute: 0.4 10*3/uL (ref 0.0–0.7)
Eosinophils Relative: 7.5 % — ABNORMAL HIGH (ref 0.0–5.0)
Lymphocytes Relative: 34 % (ref 12.0–46.0)
MCHC: 32.7 g/dL (ref 30.0–36.0)
Neutrophils Relative %: 48.4 % (ref 43.0–77.0)
Platelets: 120 10*3/uL — ABNORMAL LOW (ref 150.0–400.0)
RBC: 3.47 Mil/uL — ABNORMAL LOW (ref 3.87–5.11)
WBC: 4.8 10*3/uL (ref 4.5–10.5)

## 2012-06-07 LAB — PROTIME-INR
INR: 1.6 ratio — ABNORMAL HIGH (ref 0.8–1.0)
Prothrombin Time: 17.2 s — ABNORMAL HIGH (ref 10.2–12.4)

## 2012-06-07 NOTE — Telephone Encounter (Signed)
Papers given to libby by SN to fax.

## 2012-06-08 NOTE — Progress Notes (Signed)
Quick Note:  Called and spoke with patient, informed her of results as listed below. Verbalized understanding and nothing further needed at this time. ______

## 2012-06-13 ENCOUNTER — Other Ambulatory Visit: Payer: Self-pay | Admitting: Pain Medicine

## 2012-06-19 DIAGNOSIS — Z96659 Presence of unspecified artificial knee joint: Secondary | ICD-10-CM

## 2012-06-19 HISTORY — DX: Presence of unspecified artificial knee joint: Z96.659

## 2012-06-20 ENCOUNTER — Encounter (HOSPITAL_COMMUNITY): Payer: Self-pay | Admitting: Pharmacy Technician

## 2012-06-20 NOTE — Patient Instructions (Addendum)
20 [name]  06/20/2012   Your procedure is scheduled on: 06/28/12  Report to [facility] at 11:30 AM.  Call this number if you have problems the morning of surgery [phone]: [phone]   Remember:   Do not eat food After Midnight on 06/27/12.  Clear liquids midnight until 8:00 am on 06/28/12 then nothing.      Take these medicines the morning of surgery with A SIP OF WATER: Metoprolol tartrate, percocet if needed, gabapentin, prilosec    Do not wear jewelry, make-up or nail polish.  Do not wear lotions, powders, or perfumes. You may wear deodorant.  Do not shave 48 hours prior to surgery. Men may shave face and neck.  Do not bring valuables to the hospital.  Contacts, dentures or bridgework may not be worn into surgery.  Leave suitcase in the car. After surgery it may be brought to your room.  For patients admitted to the hospital, checkout time is 11:00 AM the day of discharge.    Special Instructions: Shower using CHG 2 nights before surgery and the night before surgery.  If you shower the day of surgery use CHG.  Use special wash - you have one bottle of CHG for all showers.  You should use approximately 1/3 of the bottle for each shower.   Please read over the following fact sheets that you were given: MRSA Information,incentive spirometer fact sheet, blood fact sheet, and clear liquids fact sheet.  Birdie Sons, RN  pre op nurse call if needed [phone]

## 2012-06-20 NOTE — Progress Notes (Addendum)
EKG 04/08/12 on EPIC and on chart, cardiac clearance note from Dr. Allyson Sabal on chart, pulmonary clearance note Dr. Kriste Basque on [facility] and chart, stress test 01/19/12 on chart

## 2012-06-21 ENCOUNTER — Other Ambulatory Visit: Payer: Self-pay | Admitting: Pulmonary Disease

## 2012-06-22 ENCOUNTER — Inpatient Hospital Stay (HOSPITAL_COMMUNITY): Admission: RE | Admit: 2012-06-22 | Payer: Medicare Other | Source: Ambulatory Visit

## 2012-06-22 ENCOUNTER — Ambulatory Visit (HOSPITAL_COMMUNITY)
Admission: RE | Admit: 2012-06-22 | Discharge: 2012-06-22 | Disposition: A | Discharge status: Home / Self Care | Payer: Medicare Other | Source: Ambulatory Visit | Attending: Specialist | Admitting: Specialist

## 2012-06-22 ENCOUNTER — Ambulatory Visit: Payer: Medicare Other | Admitting: Pulmonary Disease

## 2012-06-22 ENCOUNTER — Other Ambulatory Visit: Payer: Self-pay | Admitting: Pain Medicine

## 2012-06-22 ENCOUNTER — Encounter (HOSPITAL_COMMUNITY)
Admission: RE | Admit: 2012-06-22 | Discharge: 2012-06-22 | Disposition: A | Discharge status: Home / Self Care | Payer: Medicare Other | Source: Ambulatory Visit | Attending: Specialist | Admitting: Specialist

## 2012-06-22 ENCOUNTER — Encounter (HOSPITAL_COMMUNITY): Payer: Self-pay

## 2012-06-22 DIAGNOSIS — Z01818 Encounter for other preprocedural examination: Secondary | ICD-10-CM | POA: Insufficient documentation

## 2012-06-22 HISTORY — DX: Fibromyalgia: M79.7

## 2012-06-22 HISTORY — DX: Unspecified osteoarthritis, unspecified site: M19.90

## 2012-06-22 HISTORY — DX: Pneumonia, unspecified organism: J18.9

## 2012-06-22 HISTORY — DX: Unspecified chronic bronchitis: J42

## 2012-06-22 LAB — URINALYSIS, ROUTINE W REFLEX MICROSCOPIC
Glucose, UA: NEGATIVE mg/dL
Ketones, ur: NEGATIVE mg/dL
Protein, ur: NEGATIVE mg/dL

## 2012-06-22 LAB — COMPREHENSIVE METABOLIC PANEL
ALT: 8 U/L (ref 0–35)
BUN: 24 mg/dL — ABNORMAL HIGH (ref 6–23)
CO2: 29 mEq/L (ref 19–32)
Calcium: 9 mg/dL (ref 8.4–10.5)
Creatinine, Ser: 1.58 mg/dL — ABNORMAL HIGH (ref 0.50–1.10)
GFR calc Af Amer: 37 mL/min — ABNORMAL LOW (ref >90)
GFR calc non Af Amer: 32 mL/min — ABNORMAL LOW (ref >90)
Glucose, Bld: 93 mg/dL (ref 70–99)

## 2012-06-22 LAB — PROTIME-INR
INR: 2.28 — ABNORMAL HIGH (ref 0.00–1.49)
Prothrombin Time: 24.2 seconds — ABNORMAL HIGH (ref 11.6–15.2)

## 2012-06-22 LAB — CBC WITH DIFFERENTIAL/PLATELET
Eosinophils Relative: 6 % — ABNORMAL HIGH (ref 0–5)
HCT: 33.5 % — ABNORMAL LOW (ref 36.0–46.0)
Lymphocytes Relative: 35 % (ref 12–46)
Lymphs Abs: 1.8 10*3/uL (ref 0.7–4.0)
MCH: 29.5 pg (ref 26.0–34.0)
MCV: 94.1 fL (ref 78.0–100.0)
Monocytes Absolute: 0.5 10*3/uL (ref 0.1–1.0)
Monocytes Relative: 9 % (ref 3–12)
RBC: 3.56 MIL/uL — ABNORMAL LOW (ref 3.87–5.11)
WBC: 5.2 10*3/uL (ref 4.0–10.5)

## 2012-06-22 LAB — URINE MICROSCOPIC-ADD ON

## 2012-06-22 NOTE — H&P (Signed)
TOTAL KNEE ADMISSION H&P  Patient is being admitted for left total knee arthroplasty.  Subjective:  Chief Complaint:left knee pain.  HPI: [name], 71 y.o. female, has a history of pain and functional disability in the left knee due to arthritis and has failed non-surgical conservative treatments for greater than 12 weeks to includeNSAID's and/or analgesics, corticosteriod injections, viscosupplementation injections, flexibility and strengthening excercises, use of assistive devices and activity modification.  Onset of symptoms was gradual, starting 3 years ago with rapidlly worsening course since that time. The patient noted no past surgery on the left knee(s).  Patient currently rates pain in the left knee(s) at 10 out of 10 with activity. Patient has night pain, worsening of pain with activity and weight bearing, pain that interferes with activities of daily living, pain with passive range of motion, crepitus and joint swelling.  Patient has evidence of subchondral sclerosis, osteophytes. Loss of joint space by imaging studies. This patient has had conservative treatment. There is no active infection.  Patient Active Problem List   Diagnosis Date Noted  . DJD (degenerative joint disease) 05/24/2012  . Knee pain 05/24/2012  . Atrial fibrillation 05/03/2012  . Nonspecific abnormal finding in stool contents 04/11/2012  . GI bleed 04/08/2012  . CAD (coronary artery disease), with CABG in 1997 and negative Nuc. 01/2012 04/08/2012  . Anticoagulated on Coumadin, chronic for A. Fib 04/08/2012  . Blood in stool 04/08/2012  . Acute posthemorrhagic anemia 04/08/2012  . Coagulopathy - on chronic warfarin therapy for Afib 04/08/2012  . Otitis externa of right ear 09/18/2011  . Bronchitis 08/25/2011  . Acute blood loss anemia, due to GI bleed 12/29/2010  . OTHER SYMPTOMS INVOLVING URINARY SYSTEM 07/14/2010  . COMPRESSION FRACTURE 07/14/2010  . Other specified disorders of bladder 03/12/2010   . POSTHERPETIC NEURALGIA 01/17/2009  . HERPES ZOSTER 12/10/2008  . ANXIETY 09/15/2008  . Morbid obesity 09/16/2007  . CHRONIC PAIN SYNDROME 09/16/2007  . ARTERIOSCLEROTIC HEART DISEASE 09/16/2007  . BRONCHITIS, ACUTE WITH BRONCHOSPASM 09/16/2007  . RENAL INSUFFICIENCY 09/16/2007  . LOW BACK PAIN, CHRONIC 09/16/2007  . FIBROMYALGIA 09/16/2007  . PAGET'S DISEASE 09/16/2007  . HYPERLIPIDEMIA 07/25/2007  . VENOUS INSUFFICIENCY 07/25/2007  . COPD 07/25/2007  . GERD 07/25/2007  . IBS 07/25/2007   Past Medical History  Diagnosis Date  . Coronary atherosclerosis of unspecified type of vessel, native or graft   . Atrial fibrillation   . Unspecified venous (peripheral) insufficiency   . Other and unspecified hyperlipidemia   . Morbid obesity   . Esophageal reflux   . Irritable bowel syndrome   . Other symptoms involving urinary system   . Chronic pain syndrome   . 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 Anxiety state, unspecified   . Herpes zoster without mention of complication   . Herpes zoster with other nervous system complications   . Lumbago   . Anticoagulated on Coumadin, chronic for A. Fib 04/08/2012  . COPD (chronic obstructive pulmonary disease)   . 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 Pneumonia 4 years ago  . Bronchitis, chronic   . Fibromyalgia   . Arthritis     Past Surgical History  Procedure Date  . Vesicovaginal fistula closure w/ tah   . Cardiac catheterization   . Nissen fundoplication   . Esophagogastroduodenoscopy 04/09/2012    Procedure: ESOPHAGOGASTRODUODENOSCOPY (EGD);  Surgeon: Theda Belfast, MD;  Location: [facility] ENDOSCOPY;  Service: Endoscopy;  Laterality: N/A;  tentatively scheduled per [place] Dec  Gribbon due to patients breathing problems  . Colonoscopy 04/11/2012    Procedure: COLONOSCOPY;  Surgeon: Meryl Dare, MD,FACG;  Location: [facility] ENDOSCOPY;  Service: Endoscopy;  Laterality: N/A;  . Abdominal hysterectomy at 71  years old  . Appendectomy about 50 years ago  . Coronary artery bypass graft 1997  . Cholecystectomy 50 years ago  . Neck surgery 71 years old    full movement  . Right knee arthroscopy 3 years ago  . Eye surgery 2005    cataracts both eyes  . Back surgery     x5, "birdcage" and fused in lower back     (Not in a hospital admission) No Known Allergies  History  Substance Use Topics  . Smoking status: Former Smoker -- 1.0 packs/day for 22 years    Types: Cigarettes    Quit date: 07/20/1984  . Smokeless tobacco: Never Used  . Alcohol Use: No    Family History  Problem Relation Age of Onset  . Heart disease Mother   . Lung disease Father   . Kidney disease       Review of Systems  Respiratory: Positive for wheezing.   Musculoskeletal: Positive for joint pain.  Neurological: Positive for weakness.    Objective:  Physical Exam dictated.  Vital signs in last 24 hours: @VSRANGES @  Labs:   Estimated Body mass index is 39.00 kg/(m^2) as calculated from the following:   Height as of an earlier encounter on 06/22/12: 5\' 7" (1.702 m).   Weight as of an earlier encounter on 06/22/12: 249 lb(112.946 kg).   Imaging Review Plain radiographs demonstrate severe degenerative joint disease of the left knee(s). The overall alignment issignificant varus. The bone quality appears to be fair for age and reported activity level.  Assessment/Plan:  End stage arthritis, left knee   The patient history, physical examination, clinical judgment of the provider and imaging studies are consistent with end stage degenerative joint disease of the left knee(s) and total knee arthroplasty is deemed medically necessary. The treatment options including medical management, injection therapy arthroscopy and arthroplasty were discussed at length. The risks and benefits of total knee arthroplasty were presented and reviewed. The risks due to aseptic loosening, infection, stiffness, patella tracking  problems, thromboembolic complications and other imponderables were discussed. The patient acknowledged the explanation, agreed to proceed with the plan and consent was signed. Patient is being admitted for inpatient treatment for surgery, pain control, PT, OT, prophylactic antibiotics, VTE prophylaxis, progressive ambulation and ADL's and discharge planning. The patient is planning to be discharged home with home health services

## 2012-06-22 NOTE — Progress Notes (Signed)
PTT, PT, CMET, UA, and urine micro results sent to Dr. Eugenia Mcalpine via epic

## 2012-06-23 NOTE — H&P (Signed)
NAME:  [name], [name] NO.:  [id number]  MEDICAL RECORD NO.:  [id number]  LOCATION:  XRAY                         FACILITY:  [facility]  PHYSICIAN:  Erasmo Leventhal, M.D.DATE OF BIRTH:  02-27-1941  DATE OF ADMISSION:  06/22/2012 DATE OF DISCHARGE:  06/22/2012                             HISTORY & PHYSICAL   ADDENDUM:  The patient is a morbidly obese, conscious, alert female, ambulates with a very slow shuffled gait with a rolling walker.  She does have appearance of some windedness with any activity.  Neck is supple.  No palpable lymphadenopathy.  She has got good range of motion. Her head is normocephalic.  Her hearing is intact.  Her lungs sounds are distant.  She has a longer expiratory phase and inspiratory.  No wheezes noted today.  Her heart is irregularly irregular.  No murmurs.  Abdomen was round, full, bowel sounds present.  It was nontender.  She had no CVA region discomfort.  Extremities:  Upper extremities, right shoulder, wrist and elbow had full range of motion.  She had pain with her left shoulder with any range of motion at or above shoulder height.  She had good strength.  She had good range of motion of the elbow and wrist. She had normal light touch sensation throughout the upper extremities. She has got good radial pulses.  Lower extremities, she had good range of motion to both hips.  Her right knee she had full extension.  She reflects back to 120.  Her calf was soft and nontender.  She had good motion of the ankle.  Her left knee, she had painful range of motion with lack of 5 degrees extension, flexion back to about 100 degrees. She had varus deformity.  She had pain in medial joints, crepitus.  No ligamentous instability.  Calf was soft and nontender.  No signs of phlebitis in either lower extremities.  Good pulses in the ankle.  Good sensation in the lower extremities.  Breast, rectal, and GU exams were deferred.     Jamelle Rushing, P.A.   ______________________________ Erasmo Leventhal, M.D.    RWK/MEDQ  D:  06/22/2012  T:  06/23/2012  Job:  [id number]

## 2012-06-24 ENCOUNTER — Other Ambulatory Visit: Payer: Self-pay | Admitting: Pain Medicine

## 2012-06-26 ENCOUNTER — Other Ambulatory Visit: Payer: Self-pay | Admitting: Pulmonary Disease

## 2012-06-28 ENCOUNTER — Inpatient Hospital Stay (HOSPITAL_COMMUNITY)
Admission: RE | Admit: 2012-06-28 | Discharge: 2012-07-01 | DRG: 470 | Disposition: A | Discharge status: Home Health | Payer: Medicare Other | Source: Ambulatory Visit | Attending: Specialist | Admitting: Specialist

## 2012-06-28 ENCOUNTER — Encounter (HOSPITAL_COMMUNITY): Payer: Self-pay | Admitting: Anesthesiology

## 2012-06-28 ENCOUNTER — Inpatient Hospital Stay (HOSPITAL_COMMUNITY): Payer: Medicare Other | Admitting: Anesthesiology

## 2012-06-28 ENCOUNTER — Encounter (HOSPITAL_COMMUNITY)
Admission: RE | Disposition: A | Discharge status: Home Health | Payer: Self-pay | Source: Ambulatory Visit | Attending: Specialist

## 2012-06-28 ENCOUNTER — Encounter (HOSPITAL_COMMUNITY): Payer: Self-pay | Admitting: *Deleted

## 2012-06-28 DIAGNOSIS — K5900 Constipation, unspecified: Secondary | ICD-10-CM | POA: Diagnosis present

## 2012-06-28 DIAGNOSIS — J449 Chronic obstructive pulmonary disease, unspecified: Secondary | ICD-10-CM | POA: Diagnosis present

## 2012-06-28 DIAGNOSIS — Z7901 Long term (current) use of anticoagulants: Secondary | ICD-10-CM

## 2012-06-28 DIAGNOSIS — R195 Other fecal abnormalities: Secondary | ICD-10-CM

## 2012-06-28 DIAGNOSIS — I4891 Unspecified atrial fibrillation: Secondary | ICD-10-CM | POA: Diagnosis present

## 2012-06-28 DIAGNOSIS — N183 Chronic kidney disease, stage 3 unspecified: Secondary | ICD-10-CM | POA: Diagnosis present

## 2012-06-28 DIAGNOSIS — R3989 Other symptoms and signs involving the genitourinary system: Secondary | ICD-10-CM

## 2012-06-28 DIAGNOSIS — M889 Osteitis deformans of unspecified bone: Secondary | ICD-10-CM

## 2012-06-28 DIAGNOSIS — D689 Coagulation defect, unspecified: Secondary | ICD-10-CM

## 2012-06-28 DIAGNOSIS — G894 Chronic pain syndrome: Secondary | ICD-10-CM

## 2012-06-28 DIAGNOSIS — E871 Hypo-osmolality and hyponatremia: Secondary | ICD-10-CM | POA: Diagnosis not present

## 2012-06-28 DIAGNOSIS — M25569 Pain in unspecified knee: Secondary | ICD-10-CM

## 2012-06-28 DIAGNOSIS — J40 Bronchitis, not specified as acute or chronic: Secondary | ICD-10-CM

## 2012-06-28 DIAGNOSIS — N259 Disorder resulting from impaired renal tubular function, unspecified: Secondary | ICD-10-CM

## 2012-06-28 DIAGNOSIS — IMO0001 Reserved for inherently not codable concepts without codable children: Secondary | ICD-10-CM | POA: Diagnosis present

## 2012-06-28 DIAGNOSIS — M797 Fibromyalgia: Secondary | ICD-10-CM | POA: Diagnosis present

## 2012-06-28 DIAGNOSIS — I2510 Atherosclerotic heart disease of native coronary artery without angina pectoris: Secondary | ICD-10-CM | POA: Diagnosis present

## 2012-06-28 DIAGNOSIS — J189 Pneumonia, unspecified organism: Secondary | ICD-10-CM

## 2012-06-28 DIAGNOSIS — I872 Venous insufficiency (chronic) (peripheral): Secondary | ICD-10-CM

## 2012-06-28 DIAGNOSIS — D62 Acute posthemorrhagic anemia: Secondary | ICD-10-CM | POA: Diagnosis not present

## 2012-06-28 DIAGNOSIS — N3289 Other specified disorders of bladder: Secondary | ICD-10-CM

## 2012-06-28 DIAGNOSIS — B029 Zoster without complications: Secondary | ICD-10-CM

## 2012-06-28 DIAGNOSIS — M545 Low back pain: Secondary | ICD-10-CM

## 2012-06-28 DIAGNOSIS — J4489 Other specified chronic obstructive pulmonary disease: Secondary | ICD-10-CM | POA: Diagnosis present

## 2012-06-28 DIAGNOSIS — K921 Melena: Secondary | ICD-10-CM

## 2012-06-28 DIAGNOSIS — F411 Generalized anxiety disorder: Secondary | ICD-10-CM

## 2012-06-28 DIAGNOSIS — G8929 Other chronic pain: Secondary | ICD-10-CM

## 2012-06-28 DIAGNOSIS — K219 Gastro-esophageal reflux disease without esophagitis: Secondary | ICD-10-CM | POA: Diagnosis present

## 2012-06-28 DIAGNOSIS — D638 Anemia in other chronic diseases classified elsewhere: Secondary | ICD-10-CM | POA: Diagnosis present

## 2012-06-28 DIAGNOSIS — M1990 Unspecified osteoarthritis, unspecified site: Secondary | ICD-10-CM

## 2012-06-28 DIAGNOSIS — E441 Mild protein-calorie malnutrition: Secondary | ICD-10-CM | POA: Diagnosis present

## 2012-06-28 DIAGNOSIS — Z951 Presence of aortocoronary bypass graft: Secondary | ICD-10-CM

## 2012-06-28 DIAGNOSIS — I739 Peripheral vascular disease, unspecified: Secondary | ICD-10-CM | POA: Diagnosis present

## 2012-06-28 DIAGNOSIS — E785 Hyperlipidemia, unspecified: Secondary | ICD-10-CM | POA: Diagnosis present

## 2012-06-28 DIAGNOSIS — Z87891 Personal history of nicotine dependence: Secondary | ICD-10-CM

## 2012-06-28 DIAGNOSIS — B0229 Other postherpetic nervous system involvement: Secondary | ICD-10-CM

## 2012-06-28 DIAGNOSIS — K589 Irritable bowel syndrome without diarrhea: Secondary | ICD-10-CM

## 2012-06-28 DIAGNOSIS — J209 Acute bronchitis, unspecified: Secondary | ICD-10-CM

## 2012-06-28 DIAGNOSIS — T148XXA Other injury of unspecified body region, initial encounter: Secondary | ICD-10-CM

## 2012-06-28 DIAGNOSIS — M1710 Unilateral primary osteoarthritis, unspecified knee: Principal | ICD-10-CM | POA: Diagnosis present

## 2012-06-28 DIAGNOSIS — K922 Gastrointestinal hemorrhage, unspecified: Secondary | ICD-10-CM

## 2012-06-28 DIAGNOSIS — H6091 Unspecified otitis externa, right ear: Secondary | ICD-10-CM

## 2012-06-28 DIAGNOSIS — Z5181 Encounter for therapeutic drug level monitoring: Secondary | ICD-10-CM

## 2012-06-28 DIAGNOSIS — M1712 Unilateral primary osteoarthritis, left knee: Secondary | ICD-10-CM | POA: Diagnosis present

## 2012-06-28 HISTORY — PX: TOTAL KNEE ARTHROPLASTY: SHX125

## 2012-06-28 HISTORY — PX: JOINT REPLACEMENT: SHX530

## 2012-06-28 LAB — PROTIME-INR: INR: 1.08 (ref 0.00–1.49)

## 2012-06-28 SURGERY — ARTHROPLASTY, KNEE, TOTAL
Anesthesia: General | Site: Knee | Laterality: Left | Wound class: Clean

## 2012-06-28 MED ORDER — LIDOCAINE HCL (CARDIAC) 20 MG/ML IV SOLN
INTRAVENOUS | Status: DC | PRN
  Administered 2012-06-28: 100 mg via INTRAVENOUS

## 2012-06-28 MED ORDER — WARFARIN SODIUM 5 MG PO TABS
5.0000 mg | ORAL_TABLET | Freq: Once | ORAL | Status: AC
  Administered 2012-06-28: 5 mg via ORAL
  Filled 2012-06-28 (×2): qty 1

## 2012-06-28 MED ORDER — METHOCARBAMOL 100 MG/ML IJ SOLN
500.0000 mg | Freq: Four times a day (QID) | INTRAVENOUS | Status: DC | PRN
  Administered 2012-06-28: 500 mg via INTRAVENOUS
  Filled 2012-06-28: qty 5

## 2012-06-28 MED ORDER — ACETAMINOPHEN 10 MG/ML IV SOLN
1000.0000 mg | Freq: Four times a day (QID) | INTRAVENOUS | Status: AC
  Administered 2012-06-28 – 2012-06-29 (×4): 1000 mg via INTRAVENOUS
  Filled 2012-06-28 (×4): qty 100

## 2012-06-28 MED ORDER — ALBUTEROL SULFATE HFA 108 (90 BASE) MCG/ACT IN AERS
INHALATION_SPRAY | RESPIRATORY_TRACT | Status: DC | PRN
  Administered 2012-06-28: 5 via RESPIRATORY_TRACT

## 2012-06-28 MED ORDER — LACTATED RINGERS IV SOLN
INTRAVENOUS | Status: DC
  Administered 2012-06-28: 1000 mL via INTRAVENOUS
  Administered 2012-06-28: 19:00:00 via INTRAVENOUS

## 2012-06-28 MED ORDER — SODIUM CHLORIDE 0.9 % IR SOLN
Status: DC | PRN
  Administered 2012-06-28: 3000 mL

## 2012-06-28 MED ORDER — DOCUSATE SODIUM 100 MG PO CAPS
100.0000 mg | ORAL_CAPSULE | Freq: Two times a day (BID) | ORAL | Status: DC
  Administered 2012-06-28 – 2012-07-01 (×6): 100 mg via ORAL
  Filled 2012-06-28 (×5): qty 1

## 2012-06-28 MED ORDER — FERROUS SULFATE 325 (65 FE) MG PO TABS
325.0000 mg | ORAL_TABLET | Freq: Three times a day (TID) | ORAL | Status: DC
  Administered 2012-06-29 – 2012-07-01 (×6): 325 mg via ORAL
  Filled 2012-06-28 (×10): qty 1

## 2012-06-28 MED ORDER — ASPIRIN EC 81 MG PO TBEC
81.0000 mg | DELAYED_RELEASE_TABLET | Freq: Every day | ORAL | Status: DC
  Administered 2012-06-29 – 2012-07-01 (×3): 81 mg via ORAL
  Filled 2012-06-28 (×4): qty 1

## 2012-06-28 MED ORDER — ZOLPIDEM TARTRATE 5 MG PO TABS: 5.0000 mg | ORAL_TABLET | Freq: Every evening | ORAL | Status: DC | PRN

## 2012-06-28 MED ORDER — POLYETHYLENE GLYCOL 3350 17 G PO PACK
17.0000 g | PACK | Freq: Every day | ORAL | Status: DC | PRN
  Filled 2012-06-28: qty 1

## 2012-06-28 MED ORDER — POVIDONE-IODINE 7.5 % EX SOLN: Freq: Once | CUTANEOUS | Status: DC

## 2012-06-28 MED ORDER — MIDAZOLAM HCL 2 MG/2ML IJ SOLN
1.0000 mg | INTRAMUSCULAR | Status: DC | PRN
  Administered 2012-06-28 (×2): 2 mg via INTRAVENOUS

## 2012-06-28 MED ORDER — FENTANYL CITRATE 0.05 MG/ML IJ SOLN
INTRAMUSCULAR | Status: DC | PRN
  Administered 2012-06-28 (×3): 50 ug via INTRAVENOUS
  Administered 2012-06-28: 100 ug via INTRAVENOUS

## 2012-06-28 MED ORDER — GABAPENTIN 600 MG PO TABS
600.0000 mg | ORAL_TABLET | Freq: Four times a day (QID) | ORAL | Status: DC
  Administered 2012-06-28: 600 mg via ORAL
  Filled 2012-06-28 (×6): qty 1

## 2012-06-28 MED ORDER — HYDROMORPHONE HCL PF 1 MG/ML IJ SOLN
INTRAMUSCULAR | Status: DC | PRN
  Administered 2012-06-28 (×2): 0.5 mg via INTRAVENOUS

## 2012-06-28 MED ORDER — GUAIFENESIN ER 600 MG PO TB12
600.0000 mg | ORAL_TABLET | Freq: Two times a day (BID) | ORAL | Status: DC
  Administered 2012-06-28 – 2012-07-01 (×6): 600 mg via ORAL
  Filled 2012-06-28 (×7): qty 1

## 2012-06-28 MED ORDER — ATROPINE SULFATE 0.4 MG/ML IJ SOLN
INTRAMUSCULAR | Status: DC | PRN
  Administered 2012-06-28 (×2): 0.2 mg via INTRAVENOUS

## 2012-06-28 MED ORDER — VITAMIN B-12 1000 MCG PO TABS
1000.0000 ug | ORAL_TABLET | Freq: Every day | ORAL | Status: DC
  Administered 2012-06-29 – 2012-07-01 (×3): 1000 ug via ORAL
  Filled 2012-06-28 (×3): qty 1

## 2012-06-28 MED ORDER — MIDAZOLAM HCL 10 MG/2ML IJ SOLN: 1.0000 mg | INTRAMUSCULAR | Status: DC | PRN

## 2012-06-28 MED ORDER — FUROSEMIDE 40 MG PO TABS
40.0000 mg | ORAL_TABLET | Freq: Two times a day (BID) | ORAL | Status: DC
  Administered 2012-06-28 – 2012-06-30 (×4): 40 mg via ORAL
  Filled 2012-06-28 (×7): qty 1

## 2012-06-28 MED ORDER — KETOROLAC TROMETHAMINE 30 MG/ML IJ SOLN
INTRAMUSCULAR | Status: DC | PRN
  Administered 2012-06-28: 30 mg

## 2012-06-28 MED ORDER — ENOXAPARIN SODIUM 30 MG/0.3ML ~~LOC~~ SOLN
30.0000 mg | Freq: Two times a day (BID) | SUBCUTANEOUS | Status: DC
  Administered 2012-06-29 – 2012-06-30 (×4): 30 mg via SUBCUTANEOUS
  Filled 2012-06-28 (×7): qty 0.3

## 2012-06-28 MED ORDER — ONDANSETRON HCL 4 MG/2ML IJ SOLN
INTRAMUSCULAR | Status: DC | PRN
  Administered 2012-06-28: 4 mg via INTRAVENOUS

## 2012-06-28 MED ORDER — ACETAMINOPHEN 10 MG/ML IV SOLN
INTRAVENOUS | Status: DC | PRN
  Administered 2012-06-28: 1000 mg via INTRAVENOUS

## 2012-06-28 MED ORDER — ALPRAZOLAM 0.5 MG PO TABS
0.5000 mg | ORAL_TABLET | Freq: Three times a day (TID) | ORAL | Status: DC | PRN
  Administered 2012-06-29 – 2012-06-30 (×3): 0.5 mg via ORAL
  Filled 2012-06-28 (×3): qty 1

## 2012-06-28 MED ORDER — MIDAZOLAM HCL 5 MG/5ML IJ SOLN
INTRAMUSCULAR | Status: DC | PRN
  Administered 2012-06-28: 2 mg via INTRAVENOUS

## 2012-06-28 MED ORDER — SODIUM CHLORIDE 0.9 % IV SOLN: INTRAVENOUS | Status: DC

## 2012-06-28 MED ORDER — CALCIUM CARBONATE-VITAMIN D 500-200 MG-UNIT PO TABS
1.0000 | ORAL_TABLET | Freq: Two times a day (BID) | ORAL | Status: DC
  Administered 2012-06-29 – 2012-07-01 (×5): 1 via ORAL
  Filled 2012-06-28 (×8): qty 1

## 2012-06-28 MED ORDER — ISOSORBIDE MONONITRATE ER 30 MG PO TB24
30.0000 mg | ORAL_TABLET | Freq: Every day | ORAL | Status: DC
  Administered 2012-06-28 – 2012-06-30 (×3): 30 mg via ORAL
  Filled 2012-06-28 (×4): qty 1

## 2012-06-28 MED ORDER — SODIUM CHLORIDE 0.9 % IV SOLN
INTRAVENOUS | Status: DC
  Administered 2012-06-28: 19:00:00 via INTRAVENOUS

## 2012-06-28 MED ORDER — VANCOMYCIN HCL 1000 MG IV SOLR
1000.0000 mg | INTRAVENOUS | Status: DC | PRN
  Administered 2012-06-28: 1000 mg via INTRAVENOUS

## 2012-06-28 MED ORDER — MENTHOL 3 MG MT LOZG
1.0000 | LOZENGE | OROMUCOSAL | Status: DC | PRN
  Filled 2012-06-28: qty 9

## 2012-06-28 MED ORDER — ALBUTEROL SULFATE (5 MG/ML) 0.5% IN NEBU: 2.5000 mg | INHALATION_SOLUTION | RESPIRATORY_TRACT | Status: DC | PRN

## 2012-06-28 MED ORDER — CEFAZOLIN SODIUM-DEXTROSE 2-3 GM-% IV SOLR: 2.0000 g | INTRAVENOUS | Status: DC

## 2012-06-28 MED ORDER — POTASSIUM CHLORIDE CRYS ER 20 MEQ PO TBCR
20.0000 meq | EXTENDED_RELEASE_TABLET | Freq: Two times a day (BID) | ORAL | Status: DC
  Administered 2012-06-28 – 2012-07-01 (×6): 20 meq via ORAL
  Filled 2012-06-28 (×7): qty 1

## 2012-06-28 MED ORDER — WARFARIN - PHARMACIST DOSING INPATIENT: Freq: Every day | Status: DC

## 2012-06-28 MED ORDER — HYDROMORPHONE HCL PF 1 MG/ML IJ SOLN
0.2500 mg | INTRAMUSCULAR | Status: DC | PRN
  Administered 2012-06-28 – 2012-06-29 (×4): 0.5 mg via INTRAVENOUS
  Filled 2012-06-28 (×4): qty 1

## 2012-06-28 MED ORDER — SIMVASTATIN 20 MG PO TABS
20.0000 mg | ORAL_TABLET | Freq: Every day | ORAL | Status: DC
  Administered 2012-06-28 – 2012-06-30 (×3): 20 mg via ORAL
  Filled 2012-06-28 (×4): qty 1

## 2012-06-28 MED ORDER — ESMOLOL HCL 10 MG/ML IV SOLN
INTRAVENOUS | Status: DC | PRN
  Administered 2012-06-28: 20 mg via INTRAVENOUS
  Administered 2012-06-28: 30 mg via INTRAVENOUS
  Administered 2012-06-28: 20 mg via INTRAVENOUS

## 2012-06-28 MED ORDER — PANTOPRAZOLE SODIUM 40 MG PO TBEC
40.0000 mg | DELAYED_RELEASE_TABLET | Freq: Every day | ORAL | Status: DC
  Administered 2012-06-29 – 2012-07-01 (×3): 40 mg via ORAL
  Filled 2012-06-28 (×3): qty 1

## 2012-06-28 MED ORDER — PHENOL 1.4 % MT LIQD: 1.0000 | OROMUCOSAL | Status: DC | PRN

## 2012-06-28 MED ORDER — BUPIVACAINE-EPINEPHRINE PF 0.25-1:200000 % IJ SOLN
INTRAMUSCULAR | Status: DC | PRN
  Administered 2012-06-28: 50 mL

## 2012-06-28 MED ORDER — SIMETHICONE 80 MG PO CHEW
80.0000 mg | CHEWABLE_TABLET | Freq: Four times a day (QID) | ORAL | Status: DC | PRN
  Filled 2012-06-28: qty 1

## 2012-06-28 MED ORDER — PROMETHAZINE HCL 25 MG PO TABS: 25.0000 mg | ORAL_TABLET | Freq: Three times a day (TID) | ORAL | Status: DC | PRN

## 2012-06-28 MED ORDER — ADULT MULTIVITAMIN W/MINERALS CH
1.0000 | ORAL_TABLET | Freq: Every day | ORAL | Status: DC
  Administered 2012-06-29 – 2012-07-01 (×3): 1 via ORAL
  Filled 2012-06-28 (×4): qty 1

## 2012-06-28 MED ORDER — PROMETHAZINE HCL 25 MG/ML IJ SOLN: 6.2500 mg | INTRAMUSCULAR | Status: DC | PRN

## 2012-06-28 MED ORDER — LABETALOL HCL 5 MG/ML IV SOLN
INTRAVENOUS | Status: DC | PRN
  Administered 2012-06-28 (×2): 2.5 mg via INTRAVENOUS

## 2012-06-28 MED ORDER — HYDROMORPHONE HCL PF 1 MG/ML IJ SOLN
0.2500 mg | INTRAMUSCULAR | Status: DC | PRN
  Administered 2012-06-28 (×4): 0.5 mg via INTRAVENOUS

## 2012-06-28 MED ORDER — FLEET ENEMA 7-19 GM/118ML RE ENEM: 1.0000 | ENEMA | Freq: Once | RECTAL | Status: AC | PRN

## 2012-06-28 MED ORDER — AMITRIPTYLINE HCL 50 MG PO TABS
50.0000 mg | ORAL_TABLET | Freq: Every evening | ORAL | Status: DC | PRN
  Administered 2012-06-30: 50 mg via ORAL
  Filled 2012-06-28 (×2): qty 1

## 2012-06-28 MED ORDER — VANCOMYCIN HCL IN DEXTROSE 1-5 GM/200ML-% IV SOLN
1000.0000 mg | Freq: Two times a day (BID) | INTRAVENOUS | Status: AC
Start: 2012-06-29 — End: 2012-06-29
  Administered 2012-06-29: 1000 mg via INTRAVENOUS
  Filled 2012-06-28: qty 200

## 2012-06-28 MED ORDER — 0.9 % SODIUM CHLORIDE (POUR BTL) OPTIME
TOPICAL | Status: DC | PRN
  Administered 2012-06-28: 1000 mL

## 2012-06-28 MED ORDER — BISACODYL 10 MG RE SUPP: 10.0000 mg | Freq: Every day | RECTAL | Status: DC | PRN

## 2012-06-28 MED ORDER — PROPOFOL 10 MG/ML IV BOLUS
INTRAVENOUS | Status: DC | PRN
  Administered 2012-06-28: 150 mg via INTRAVENOUS

## 2012-06-28 MED ORDER — DIPHENHYDRAMINE HCL 12.5 MG/5ML PO ELIX: 12.5000 mg | ORAL_SOLUTION | ORAL | Status: DC | PRN

## 2012-06-28 MED ORDER — METHOCARBAMOL 500 MG PO TABS
500.0000 mg | ORAL_TABLET | Freq: Four times a day (QID) | ORAL | Status: DC | PRN
  Administered 2012-07-01: 500 mg via ORAL
  Filled 2012-06-28: qty 1

## 2012-06-28 MED ORDER — SUCCINYLCHOLINE CHLORIDE 20 MG/ML IJ SOLN
INTRAMUSCULAR | Status: DC | PRN
  Administered 2012-06-28: 100 mg via INTRAVENOUS

## 2012-06-28 MED ORDER — OXYCODONE HCL 5 MG PO TABS
5.0000 mg | ORAL_TABLET | ORAL | Status: DC | PRN
  Administered 2012-06-28 – 2012-06-29 (×6): 15 mg via ORAL
  Administered 2012-06-30 (×3): 10 mg via ORAL
  Administered 2012-06-30: 15 mg via ORAL
  Administered 2012-06-30: 10 mg via ORAL
  Administered 2012-06-30: 15 mg via ORAL
  Administered 2012-07-01: 10 mg via ORAL
  Filled 2012-06-28: qty 3
  Filled 2012-06-28 (×2): qty 2
  Filled 2012-06-28 (×4): qty 3
  Filled 2012-06-28 (×2): qty 2
  Filled 2012-06-28 (×3): qty 3
  Filled 2012-06-28: qty 2

## 2012-06-28 MED ORDER — HEMOSTATIC AGENTS (NO CHARGE) OPTIME
TOPICAL | Status: DC | PRN
  Administered 2012-06-28: 1 via TOPICAL

## 2012-06-28 MED ORDER — METOPROLOL TARTRATE 50 MG PO TABS
50.0000 mg | ORAL_TABLET | Freq: Two times a day (BID) | ORAL | Status: DC
  Administered 2012-06-28 – 2012-07-01 (×6): 50 mg via ORAL
  Filled 2012-06-28 (×7): qty 1

## 2012-06-28 SURGICAL SUPPLY — 63 items
ANCHOR SUPER QUICK (Anchor) ×4 IMPLANT
BAG ZIPLOCK 12X15 (MISCELLANEOUS) ×4 IMPLANT
BANDAGE ELASTIC 4 VELCRO ST LF (GAUZE/BANDAGES/DRESSINGS) ×2 IMPLANT
BANDAGE ELASTIC 6 VELCRO ST LF (GAUZE/BANDAGES/DRESSINGS) ×2 IMPLANT
BANDAGE ESMARK 6X9 LF (GAUZE/BANDAGES/DRESSINGS) ×1 IMPLANT
BANDAGE GAUZE ELAST BULKY 4 IN (GAUZE/BANDAGES/DRESSINGS) ×2 IMPLANT
BLADE SAG 18X100X1.27 (BLADE) ×2 IMPLANT
BLADE SAW SGTL 13.0X1.19X90.0M (BLADE) ×2 IMPLANT
BNDG ESMARK 6X9 LF (GAUZE/BANDAGES/DRESSINGS) ×2
CEMENT HV SMART SET (Cement) ×4 IMPLANT
CLOTH BEACON ORANGE TIMEOUT ST (SAFETY) ×2 IMPLANT
CUFF TOURN SGL QUICK 34 (TOURNIQUET CUFF) ×1
CUFF TRNQT CYL 34X4X40X1 (TOURNIQUET CUFF) ×1 IMPLANT
DRAPE EXTREMITY T 121X128X90 (DRAPE) ×2 IMPLANT
DRAPE LG THREE QUARTER DISP (DRAPES) ×2 IMPLANT
DRAPE POUCH INSTRU U-SHP 10X18 (DRAPES) ×2 IMPLANT
DRAPE U-SHAPE 47X51 STRL (DRAPES) ×2 IMPLANT
DRSG PAD ABDOMINAL 8X10 ST (GAUZE/BANDAGES/DRESSINGS) ×6 IMPLANT
DURAPREP 26ML APPLICATOR (WOUND CARE) ×2 IMPLANT
ELECT REM PT RETURN 9FT ADLT (ELECTROSURGICAL) ×2
ELECTRODE REM PT RTRN 9FT ADLT (ELECTROSURGICAL) ×1 IMPLANT
EVACUATOR 1/8 PVC DRAIN (DRAIN) ×2 IMPLANT
FACESHIELD LNG OPTICON STERILE (SAFETY) ×10 IMPLANT
GAUZE XEROFORM 2X2 STRL (GAUZE/BANDAGES/DRESSINGS) ×2 IMPLANT
GAUZE XEROFORM 4X4 STRL (GAUZE/BANDAGES/DRESSINGS) ×2 IMPLANT
GLOVE ECLIPSE 8.0 STRL XLNG CF (GLOVE) ×2 IMPLANT
GLOVE SURG ORTHO 8.0 STRL STRW (GLOVE) ×2 IMPLANT
GLOVE SURG ORTHO 9.0 STRL STRW (GLOVE) ×2 IMPLANT
GOWN PREVENTION PLUS XLARGE (GOWN DISPOSABLE) IMPLANT
GOWN STRL NON-REIN LRG LVL3 (GOWN DISPOSABLE) ×4 IMPLANT
GOWN STRL REIN XL XLG (GOWN DISPOSABLE) ×6 IMPLANT
HANDPIECE INTERPULSE COAX TIP (DISPOSABLE) ×1
IMMOBILIZER KNEE 20 (SOFTGOODS) ×2
IMMOBILIZER KNEE 20 THIGH 36 (SOFTGOODS) ×1 IMPLANT
KIT BASIN OR (CUSTOM PROCEDURE TRAY) ×2 IMPLANT
NDL SAFETY ECLIPSE 18X1.5 (NEEDLE) ×1 IMPLANT
NEEDLE HYPO 18GX1.5 SHARP (NEEDLE) ×1
NS IRRIG 1000ML POUR BTL (IV SOLUTION) ×2 IMPLANT
PACK TOTAL JOINT (CUSTOM PROCEDURE TRAY) ×2 IMPLANT
POSITIONER SURGICAL ARM (MISCELLANEOUS) ×2 IMPLANT
SET HNDPC FAN SPRY TIP SCT (DISPOSABLE) ×1 IMPLANT
SET PAD KNEE POSITIONER (MISCELLANEOUS) ×2 IMPLANT
SPONGE GAUZE 4X4 12PLY (GAUZE/BANDAGES/DRESSINGS) ×2 IMPLANT
SPONGE LAP 18X18 X RAY DECT (DISPOSABLE) IMPLANT
SPONGE SURGIFOAM ABS GEL 100 (HEMOSTASIS) ×2 IMPLANT
STOCKINETTE 6  STRL (DRAPES) ×1
STOCKINETTE 6 STRL (DRAPES) ×1 IMPLANT
STRIP CLOSURE SKIN 1/2X4 (GAUZE/BANDAGES/DRESSINGS) ×2 IMPLANT
SUCTION FRAZIER 12FR DISP (SUCTIONS) ×2 IMPLANT
SUT BONE WAX W31G (SUTURE) ×2 IMPLANT
SUT MNCRL AB 3-0 PS2 18 (SUTURE) ×2 IMPLANT
SUT VIC AB 1 CT1 27 (SUTURE) ×7
SUT VIC AB 1 CT1 27XBRD ANTBC (SUTURE) ×7 IMPLANT
SUT VIC AB 2-0 CT1 27 (SUTURE) ×2
SUT VIC AB 2-0 CT1 TAPERPNT 27 (SUTURE) ×2 IMPLANT
SUT VLOC 180 0 24IN GS25 (SUTURE) ×2 IMPLANT
SYR 50ML LL SCALE MARK (SYRINGE) ×2 IMPLANT
TAPE STRIPS DRAPE STRL (GAUZE/BANDAGES/DRESSINGS) ×2 IMPLANT
TOWEL OR 17X26 10 PK STRL BLUE (TOWEL DISPOSABLE) ×6 IMPLANT
TOWER CARTRIDGE SMART MIX (DISPOSABLE) ×2 IMPLANT
TRAY FOLEY CATH 14FRSI W/METER (CATHETERS) ×2 IMPLANT
WATER STERILE IRR 1500ML POUR (IV SOLUTION) ×4 IMPLANT
WRAP KNEE MAXI GEL POST OP (GAUZE/BANDAGES/DRESSINGS) ×2 IMPLANT

## 2012-06-28 NOTE — Transfer of Care (Signed)
Immediate Anesthesia Transfer of Care Note  Patient: [name]  Procedure(s) Performed: Procedure(s) (LRB): TOTAL KNEE ARTHROPLASTY (Left)  Patient Location: PACU  Anesthesia Type: General  Level of Consciousness: sedated, patient cooperative and responds to stimulaton  Airway & Oxygen Therapy: Patient Spontanous Breathing and Patient connected to face mask oxgen  Post-op Assessment: Report given to PACU RN and Post -op Vital signs reviewed and stable  Post vital signs: Reviewed and stable  Complications: No apparent anesthesia complications

## 2012-06-28 NOTE — Progress Notes (Signed)
Patient ID: [name], female   DOB: 1940/09/20, 71 y.o.   MRN: [id number] A dateI have seen and examined this patient.  Agree with the note above.  Jesselee Poth ANDREW 06/28/2012 3:11 PM

## 2012-06-28 NOTE — Consult Note (Signed)
[name] Medical Consultation  [name] JYN:[id number] DOB: [birth year] DOA: 06/28/2012 PCP: Michele Mcalpine, MD   Requesting physician: Dr Eugenia Mcalpine, orthopedic surgeon. Date of consultation: 06/28/12.  Reason for consultation: Peri-operative management of medical issues.   Impression/Recommendations   1. Osteoarthritis of left knee: This is end-stage, and patient is s/p Left TKA today. Immediate post-op condition appears satisfactory. Manage per primary service. Will also defer post-op pain management to orthopedic team.  2. Fibromyalgia/Chronic pain: Not problematic at this time.  3. GERD (gastroesophageal reflux disease): Patient is s/p Nissen fundoplication. Will continue PPI.  4. COPD (chronic obstructive pulmonary disease): Stable /Asymptomatic at this time. Manage with prn bronchodilators.  5. CAD: Stable/Asymptomatic. Patient has no symptoms of chest pain or SOB. Continue pre-admission beta-blockade, Nitrates and low dose ASA.  6. Atrial fibrillation: Patient is currently in rate-controlled atrial fibrillation, and has no clinical evidence of CHF. Monitor telemetrically. As described above, beta-blocker will be continued.  7. Chronic anticoagulation: Manage per clinical pharmacologist. Coumadin was discontinue on 06/23/12, and patient has been on bridging Lovenox. Coumadin may be re-started from 06/29/12.  8. Dyslipidemia: Continue Statin.  9. CKD: Patient has known CKD-3, with baseline creatinine of 1.6-1.89 in 04/2012-05/2012. Creatinine was 1.58 on 06/23/12. Follow renal indices. Would recommend discontinue iv fluids on 06/29/12 if adequately hydrated, to avoid fluid overload.   TRH will followup again tomorrow. Please contact me if I can be of assistance in the meanwhile. Thank you for this consultation.  Chief Complaint: Patient has rather complex medical history (see details below), and is s/p left TKA 12/101/3. For end-stage OA. The medical service is  requested to manage medical issues,peri-operatively.   HPI:  71 y.o. Female with known history of CAD, s/p CABG 1997, Atrial Fibrillation on Coumadin anticoagulation, history of lower GI bleed, Morbid obesity, H.Zoster/Post-herpetic neuralgia, chronic pain syndrome,  Paget's disease, dyslipidemia, Fibromyalgia, GERD s/p Nissen fundoplication, IBS, COPD, chronic venous insufficiency, CKD, DJD s/p neck surgery, vertebral compression fracture, chronic low back pain s/p lumbar surgery,  admitted 06/28/12, and is s/p left total knee arthroplasty on the same date, for pain and functional disability in the left knee due to arthritis, having failed non-surgical conservative treatments for greater than 12 weeks.    Review of Systems:  As per HPI and chief complaint. Patent denies fatigue, diminished appetite, weight loss, fever, chills, headache, blurred vision, difficulty in speaking, dysphagia, chest pain, cough, shortness of breath, orthopnea, paroxysmal nocturnal dyspnea, nausea, diaphoresis, abdominal pain, vomiting, diarrhea, hematemesis, melena, dysuria, nocturia, urinary frequency, hematochezia, lower extremity swelling, although she had been troubled by intractable left knee pain, prior to surgery. No LE redness. The rest of the systems review is negative.   Past Medical History  Diagnosis Date  . Coronary atherosclerosis of unspecified type of vessel, native or graft   . Atrial fibrillation   . Unspecified venous (peripheral) insufficiency   . Other and unspecified hyperlipidemia   . Morbid obesity   . Esophageal reflux   . Irritable bowel syndrome   . Other symptoms involving urinary system   . Chronic pain syndrome   . 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 Anxiety state, unspecified   . Herpes zoster without mention of complication   . Herpes zoster with other nervous system complications   . Lumbago   . Anticoagulated on Coumadin, chronic for A. Fib 04/08/2012  . COPD  (chronic obstructive pulmonary disease)   . 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  Pneumonia 4 years ago  . Bronchitis, chronic   . Fibromyalgia   . Arthritis    Past Surgical History  Procedure Date  . Vesicovaginal fistula closure w/ tah   . Cardiac catheterization   . Nissen fundoplication   . Esophagogastroduodenoscopy 04/09/2012    Procedure: ESOPHAGOGASTRODUODENOSCOPY (EGD);  Surgeon: Theda Belfast, MD;  Location: [facility] ENDOSCOPY;  Service: Endoscopy;  Laterality: N/A;  tentatively scheduled per Blase Mess due to patients breathing problems  . Colonoscopy 04/11/2012    Procedure: COLONOSCOPY;  Surgeon: Meryl Dare, MD,FACG;  Location: [facility] ENDOSCOPY;  Service: Endoscopy;  Laterality: N/A;  . Abdominal hysterectomy at 71 years old  . Appendectomy about 50 years ago  . Coronary artery bypass graft 1997  . Cholecystectomy 50 years ago  . Neck surgery 71 years old    full movement  . Right knee arthroscopy 3 years ago  . Eye surgery 2005    cataracts both eyes  . Back surgery     x5, "birdcage" and fused in lower back   Social History:  reports that she quit smoking about 27 years ago. Her smoking use included Cigarettes. She has a 22 pack-year smoking history. She has never used smokeless tobacco. She reports that she does not drink alcohol or use illicit drugs.  No Known Allergies Family History  Problem Relation Age of Onset  . Heart disease Mother   . Lung disease Father   . Kidney disease      Prior to Admission medications   Medication Sig Start Date End Date Taking? Authorizing Provider  albuterol (PROVENTIL) (2.5 MG/3ML) 0.083% nebulizer solution 1 vial in neb up to 4 times daily as needed for shortness of breath or wheezing1 vial in neb up to 4 times daily as needed for shortness of breath or wheezing 04/28/12  Yes Tammy S Parrett, NP  ALPRAZolam (XANAX) 0.5 MG tablet TAKE 1 TABLET BY MOUTH 4 TIMES A DAY AS NEEDED FOR NERVES 06/26/12  Yes  Michele Mcalpine, MD  amitriptyline (ELAVIL) 50 MG tablet TAKE 2 TABLETS BY MOUTH AT BEDTIME 06/07/12  Yes Michele Mcalpine, MD  aspirin EC 81 MG tablet Take 81 mg by mouth daily.   Yes Historical Provider, MD  Calcium Carbonate-Vitamin D (CALCIUM-VITAMIN D) 500-200 MG-UNIT per tablet Take 1 tablet by mouth 2 (two) times daily with a meal.   Yes Historical Provider, MD  enoxaparin (LOVENOX) 120 MG/0.8ML injection Inject 120 mg into the skin daily.   Yes Historical Provider, MD  furosemide (LASIX) 40 MG tablet Take 40 mg by mouth 2 (two) times daily.    Yes Historical Provider, MD  gabapentin (NEURONTIN) 600 MG tablet TAKE 1 TABLET FOUR TIMES A DAY 04/26/12  Yes Michele Mcalpine, MD  guaiFENesin (MUCINEX) 600 MG 12 hr tablet Take 600 mg by mouth 2 (two) times daily.    Yes Historical Provider, MD  isosorbide mononitrate (IMDUR) 30 MG 24 hr tablet Take 30 mg by mouth at bedtime.    Yes Historical Provider, MD  KLOR-CON M20 20 MEQ tablet TAKE 1 TABLET TWICE A DAY 05/02/12  Yes Michele Mcalpine, MD  metoprolol tartrate (LOPRESSOR) 25 MG tablet Take 50 mg by mouth 2 (two) times daily. 04/13/12  Yes Laveda Norman, MD  Multiple Vitamin (MULTIVITAMIN WITH MINERALS) TABS Take 1 tablet by mouth daily.   Yes Historical Provider, MD  omeprazole (PRILOSEC) 20 MG capsule Take 1 capsule (20 mg total) by mouth 2 (two) times daily. 10/23/11  10/22/12 Yes Michele Mcalpine, MD  oxyCODONE-acetaminophen (PERCOCET) 5-325 MG per tablet Take 1-2 tablets by mouth every 4 (four) hours as needed. For pain   Yes Historical Provider, MD  promethazine (PHENERGAN) 25 MG tablet TAKE 1 TABLET BY MOUTH EVERY 6 HOURS AS NEEDED FOR PAIN 06/21/12  Yes Michele Mcalpine, MD  simvastatin (ZOCOR) 20 MG tablet Take 20 mg by mouth at bedtime.    Yes Historical Provider, MD  vitamin B-12 (CYANOCOBALAMIN) 1000 MCG tablet Take 1,000 mcg by mouth daily.    Yes Historical Provider, MD  simethicone (MYLICON) 80 MG chewable tablet Chew 80 mg by mouth every 6 (six) hours as  needed. Gas    Historical Provider, MD  warfarin (COUMADIN) 5 MG tablet Take 2.5-5 mg by mouth See admin instructions. 5mg  Sunday, Tuesday, Thursday, and Saturday   2.5mg  on monday,wednesday,friday    Historical Provider, MD   Physical Exam: Blood pressure 120/83, pulse 105, temperature 98.6 F (37 C), temperature source Oral, resp. rate 17, SpO2 100.00%. Filed Vitals:   06/28/12 1800 06/28/12 1805 06/28/12 1810 06/28/12 1815  BP: 130/59   120/83  Pulse: 102 106 111 105  Temp:      TempSrc:      Resp: 24 24 21 17   SpO2: 95% 98% 100% 100%    General: Patient was examined in the recovery room (PACU), and appeared comfortable immediately post-op, alert, communicative, fully oriented, not short of breath at rest.  HEENT:  Mild clinical pallor, no jaundice, no conjunctival injection or discharge. Hydration status appears fair.  NECK:  Supple, JVP not seen, no carotid bruits, no palpable lymphadenopathy, no palpable goiter. CHEST:  Clinically clear to auscultation, no wheezes, no crackles. HEART:  Sounds 1 and 2 heard, normal, irregular, no murmurs. ABDOMEN:  Morbidly obese, non-tender, no palpable organomegaly, no palpable masses, normal bowel sounds. GENITALIA:  Not examined. Has a Foley catheter in-situ. LOWER EXTREMITIES:  No pitting edema, RLE, LLE is in a support. Palpable peripheral pulses. MUSCULOSKELETAL SYSTEM:  Generalized osteoarthritic changes. CENTRAL NERVOUS SYSTEM:  No focal neurologic deficit on gross examination.  Labs on Admission:  Basic Metabolic Panel:  Lab 06/22/12 1610  NA 136  K 4.4  CL 96  CO2 29  GLUCOSE 93  BUN 24*  CREATININE 1.58*  CALCIUM 9.0  MG --  PHOS --   Liver Function Tests:  Lab 06/22/12 0915  AST 19  ALT 8  ALKPHOS 127*  BILITOT 0.7  PROT 7.7  ALBUMIN 4.0   No results found for this basename: LIPASE:5,AMYLASE:5 in the last 168 hours No results found for this basename: AMMONIA:5 in the last 168 hours CBC:  Lab 06/22/12 0915   WBC 5.2  NEUTROABS 2.6  HGB 10.5*  HCT 33.5*  MCV 94.1  PLT 148*   Cardiac Enzymes: No results found for this basename: CKTOTAL:5,CKMB:5,CKMBINDEX:5,TROPONINI:5 in the last 168 hours BNP: No components found with this basename: POCBNP:5 CBG: No results found for this basename: GLUCAP:5 in the last 168 hours  Radiological Exams on Admission: No results found.  EKG: Independently reviewed. Rate-controlled. Atrial fibrillation.   Time spent: 45 mins  Ernestine Rohman,CHRISTOPHER [name] Pager [phone]  If 7PM-7AM, please contact night-coverage [email or web address] Password [email or web address] 06/28/2012, 6:22 PM

## 2012-06-28 NOTE — Anesthesia Postprocedure Evaluation (Signed)
  Anesthesia Post-op Note  Patient: [name]  Procedure(s) Performed: Procedure(s) (LRB): TOTAL KNEE ARTHROPLASTY (Left)  Patient Location: PACU  Anesthesia Type: GA combined with regional for post-op pain  Level of Consciousness: awake and alert   Airway and Oxygen Therapy: Patient Spontanous Breathing  Post-op Pain: mild  Post-op Assessment: Post-op Vital signs reviewed, Patient's Cardiovascular Status Stable, Respiratory Function Stable, Patent Airway and No signs of Nausea or vomiting  Last Vitals:  Filed Vitals:   06/28/12 1800  BP: 130/59  Pulse: 102  Temp:   Resp: 24    Post-op Vital Signs: stable   Complications: No apparent anesthesia complications

## 2012-06-28 NOTE — Anesthesia Preprocedure Evaluation (Addendum)
Anesthesia Evaluation  Patient identified by MRN, date of birth, ID band Patient awake    Reviewed: Allergy & Precautions, H&P , NPO status , Patient's Chart, lab work & pertinent test results  Airway Mallampati: II TM Distance: >3 FB Neck ROM: Full    Dental No notable dental hx.    Pulmonary COPDformer smoker,  breath sounds clear to auscultation  + decreased breath sounds      Cardiovascular hypertension, Pt. on medications + CAD, + CABG and + Peripheral Vascular Disease + dysrhythmias Atrial Fibrillation Rhythm:Irregular Rate:Normal     Neuro/Psych negative neurological ROS  negative psych ROS   GI/Hepatic negative GI ROS, Neg liver ROS,   Endo/Other  Morbid obesity  Renal/GU negative Renal ROS  negative genitourinary   Musculoskeletal negative musculoskeletal ROS (+)   Abdominal   Peds negative pediatric ROS (+)  Hematology negative hematology ROS (+)   Anesthesia Other Findings   Reproductive/Obstetrics negative OB ROS                           Anesthesia Physical Anesthesia Plan  ASA: III  Anesthesia Plan: General   Post-op Pain Management:    Induction: Intravenous  Airway Management Planned: Oral ETT  Additional Equipment:   Intra-op Plan:   Post-operative Plan: Extubation in OR  Informed Consent: I have reviewed the patients History and Physical, chart, labs and discussed the procedure including the risks, benefits and alternatives for the proposed anesthesia with the patient or authorized representative who has indicated his/her understanding and acceptance.   Dental advisory given  Plan Discussed with: CRNA and Surgeon  Anesthesia Plan Comments:         Anesthesia Quick Evaluation

## 2012-06-28 NOTE — Progress Notes (Addendum)
ANTICOAGULATION CONSULT NOTE - Initial Consult  Pharmacy Consult for Coumadin Indication: VTE prophylaxis, On Coumadin PTA for hx Afib  No Known Allergies  Patient Measurements: 113 kg 67 in   Vital Signs: Temp: 98.6 F (37 C) (12/10 1815) Temp src: Oral (12/10 1114) BP: 119/65 mmHg (12/10 1900) Pulse Rate: 99  (12/10 1900)  Labs:  Basename 06/28/12 1205  HGB --  HCT --  PLT --  APTT --  LABPROT 13.9  INR 1.08  HEPARINUNFRC --  CREATININE --  CKTOTAL --  CKMB --  TROPONINI --    The CrCl is unknown because both a height and weight (above a minimum accepted value) are required for this calculation.   Medical History: Past Medical History  Diagnosis Date  . Coronary atherosclerosis of unspecified type of vessel, native or graft   . Atrial fibrillation   . Unspecified venous (peripheral) insufficiency   . Other and unspecified hyperlipidemia   . Morbid obesity   . Esophageal reflux   . Irritable bowel syndrome   . Other symptoms involving urinary system   . Chronic pain syndrome   . 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 Anxiety state, unspecified   . Herpes zoster without mention of complication   . Herpes zoster with other nervous system complications   . Lumbago   . Anticoagulated on Coumadin, chronic for A. Fib 04/08/2012  . COPD (chronic obstructive pulmonary disease)   . 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 Pneumonia 4 years ago  . Bronchitis, chronic   . Fibromyalgia   . Arthritis     Medications:  Prescriptions prior to admission  Medication Sig Dispense Refill  . albuterol (PROVENTIL) (2.5 MG/3ML) 0.083% nebulizer solution 1 vial in neb up to 4 times daily as needed for shortness of breath or wheezing1 vial in neb up to 4 times daily as needed for shortness of breath or wheezing  150 mL  PRN  . ALPRAZolam (XANAX) 0.5 MG tablet TAKE 1 TABLET BY MOUTH 4 TIMES A DAY AS NEEDED FOR NERVES  120 tablet  5  .  amitriptyline (ELAVIL) 50 MG tablet TAKE 2 TABLETS BY MOUTH AT BEDTIME  180 tablet  0  . aspirin EC 81 MG tablet Take 81 mg by mouth daily.      . Calcium Carbonate-Vitamin D (CALCIUM-VITAMIN D) 500-200 MG-UNIT per tablet Take 1 tablet by mouth 2 (two) times daily with a meal.      . enoxaparin (LOVENOX) 120 MG/0.8ML injection Inject 120 mg into the skin daily.      . furosemide (LASIX) 40 MG tablet Take 40 mg by mouth 2 (two) times daily.       Marland Kitchen gabapentin (NEURONTIN) 600 MG tablet TAKE 1 TABLET FOUR TIMES A DAY  360 tablet  2  . guaiFENesin (MUCINEX) 600 MG 12 hr tablet Take 600 mg by mouth 2 (two) times daily.       . isosorbide mononitrate (IMDUR) 30 MG 24 hr tablet Take 30 mg by mouth at bedtime.       Marland Kitchen KLOR-CON M20 20 MEQ tablet TAKE 1 TABLET TWICE A DAY  180 tablet  3  . metoprolol tartrate (LOPRESSOR) 25 MG tablet Take 50 mg by mouth 2 (two) times daily.      . Multiple Vitamin (MULTIVITAMIN WITH MINERALS) TABS Take 1 tablet by mouth daily.      Marland Kitchen omeprazole (PRILOSEC) 20 MG capsule Take 1 capsule (20 mg  total) by mouth 2 (two) times daily.  180 capsule  0  . oxyCODONE-acetaminophen (PERCOCET) 5-325 MG per tablet Take 1-2 tablets by mouth every 4 (four) hours as needed. For pain      . promethazine (PHENERGAN) 25 MG tablet TAKE 1 TABLET BY MOUTH EVERY 6 HOURS AS NEEDED FOR PAIN  30 tablet  1  . simvastatin (ZOCOR) 20 MG tablet Take 20 mg by mouth at bedtime.       . vitamin B-12 (CYANOCOBALAMIN) 1000 MCG tablet Take 1,000 mcg by mouth daily.       . simethicone (MYLICON) 80 MG chewable tablet Chew 80 mg by mouth every 6 (six) hours as needed. Gas      . warfarin (COUMADIN) 5 MG tablet Take 2.5-5 mg by mouth See admin instructions. 5mg  Sunday, Tuesday, Thursday, and Saturday   2.5mg  on monday,wednesday,friday       Scheduled:    . aspirin EC  81 mg Oral Daily  .  ceFAZolin (ANCEF) IV  2 g Intravenous 60 min Pre-Op  . povidone-iodine   Topical Once   Infusions:    . sodium chloride     . sodium chloride 75 mL/hr at 06/28/12 1901  . lactated ringers 100 mL/hr at 06/28/12 1847   PRN: HYDROmorphone (DILAUDID) injection, methocarbamol (ROBAXIN) IV, methocarbamol, midazolam, midazolam, promethazine, [DISCONTINUED] 0.9 % irrigation (POUR BTL), [DISCONTINUED] bupivacaine-EPINEPHrine (PF), [DISCONTINUED] hemostatic agents, [DISCONTINUED] ketorolac, [DISCONTINUED] sodium chloride irrigation  Assessment: 71 YOF s/p L TKA. Coumadin per pharmacy for VTE PPX ordered. Pt was on Coumadin PTA for hx Afib. INR 1.08 (2.28, 12/4). Dose PTA = 5mg  Evern Bio Thurs and Sat. 2.5mg  on other days.  Goal of Therapy:  INR 2-3   Plan:  Coumadin 5mg  tonight Daily INR  Gwen Her PharmD  [phone] 06/28/2012 7:18 PM

## 2012-06-28 NOTE — Preoperative (Signed)
Beta Blockers   Reason not to administer Beta Blockers:Not Applicable Patient took Beta Blocker 06-28-12

## 2012-06-28 NOTE — Op Note (Signed)
DATE OF SURGERY:  06/28/2012  TIME: 5:20 PM  PATIENT NAME:  [name]    AGE: 71 y.o.   PRE-OPERATIVE DIAGNOSIS:  LEFT KNEE OA   POST-OPERATIVE DIAGNOSIS:  LEFT KNEE OA   PROCEDURE:  Procedure(s): TOTAL KNEE ARTHROPLASTY  SURGEON:  Kynzi Levay ANDREW  ASSISTANT:  Oneida Alar, PA-C, present and scrubbed throughout the case, critical for assistance with exposure, retraction, instrumentation, and closure.  OPERATIVE IMPLANTS: Depuy PFC Sigma Rotating Platform.  Femur size 4, Tibia size 4, Patella size 35 3-peg oval button, with a 12.5 mm polyethylene insert.   PREOPERATIVE INDICATIONS:   [name] is a 71 y.o. year old female with end stage bone on bone arthritis of the knee who failed conservative treatment and elected for Total Knee Arthroplasty.   The risks, benefits, and alternatives were discussed at length including but not limited to the risks of infection, bleeding, nerve injury, stiffness, blood clots, the need for revision surgery, cardiopulmonary complications, among others, and they were willing to proceed.  OPERATIVE DESCRIPTION:  The patient was brought to the operative room and placed in a supine position.  Spinal anesthesia was administered.  IV antibiotics were given.  The lower extremity was prepped and draped in the usual sterile fashion.  Time out was performed.  The leg was elevated and exsanguinated and the tourniquet was inflated.  Anterior quadriceps tendon splitting approach was performed.  The patella was retracted and osteophytes were removed.  The anterior horn of the medial and lateral meniscus was removed and cruciate ligaments resected.   The distal femur was opened with the drill and the intramedullary distal femoral cutting jig was utilized, set at 5 degrees resecting 10 mm off the distal femur.  Care was taken to protect the collateral ligaments.  The distal femoral sizing jig was applied, taking care to avoid notching.  Then the  4-in-1 cutting jig was applied and the anterior and posterior femur was cut, along with the chamfer cuts.    Then the extramedullary tibial cutting jig was utilized making the appropriate cut using the anterior tibial crest as a reference building in appropriate posterior slope.  Care was taken during the cut to protect the medial and collateral ligaments.  The proximal tibia was removed along with the posterior horns of the menisci.   The posterior medial femoral osteophytes and posterior lateral femoral osteophytes were removed.    The flexion gap was then measured and was symmetric with the extension gap, measured at 12.  I completed the distal femoral preparation using the appropriate jig to prepare the box.  The patella was then measured, and cut with the saw.    The proximal tibia sized and prepared accordingly with the reamer and the punch, and then all components were trialed with the trial insert.  The knee was found to have excellent balance and full motion.    The above named components were then cemented into place and all excess cement was removed.  The trial polyethylene component was in place during cementation, and then was exchanged for the real polyethylene component.    The knee was easily taken through a range of motion and the patella tracked well and the knee irrigated copiously and the parapatellar and subcutaneous tissue closed with vicryl, and monocryl with steri strips for the skin.  The arthrotomy was closed at 90 of flexion. The wounds were dressed with sterile gauze and the tourniquet released and the patient was awakened and returned to the PACU in  stable and satisfactory condition.  There were no complications.  Total tourniquet time was 95 minutes. During the procedure the patient's bone was noted to be markedly osteoporotic. During the exposure a portion of the patella tendon was slightly avulsed with a small piece of bone secondary to her poor bone and tendon  quality.  At in the case after implantation of the components and cementing to Mytec anchors were tapped into position and properly repair the tendon back down to bleeding bone and reinforced it with oversewing medially. Closure the capsulotomy is done with a few Vicryl sutures and then a running V. LOC suture a medium Hemovac drain was placed prior to closure. At the in the case the patient full extension actively the flexor to 45-50 without excessive tension of the patella tendon repair and patella tracked anatomically.  Help with patient positioning prepping draping surgical tentacle assistance throughout the entire case assistance in wound closure application dressing this implant her [name] assistance was needed.

## 2012-06-28 NOTE — Anesthesia Procedure Notes (Addendum)
Anesthesia Regional Block:  Femoral nerve block  Pre-Anesthetic Checklist: ,, timeout performed, Correct Patient, Correct Site, Correct Laterality, Correct Procedure, Correct Position, site marked, Risks and benefits discussed,  Surgical consent,  Pre-op evaluation,  At surgeon's request and post-op pain management   Prep: chloraprep       Needles:  Injection technique: Single-shot  Needle Type: Echogenic Needle          Additional Needles:  Procedures: ultrasound guided (picture in chart) Femoral nerve block Narrative:  Injection made incrementally with aspirations every 5 mL.  Performed by: Personally  Anesthesiologist: Eilene Ghazi MD  Additional Notes: Patient tolerated the procedure well without complications  Femoral nerve block

## 2012-06-29 DIAGNOSIS — J189 Pneumonia, unspecified organism: Secondary | ICD-10-CM

## 2012-06-29 DIAGNOSIS — J209 Acute bronchitis, unspecified: Secondary | ICD-10-CM

## 2012-06-29 DIAGNOSIS — K219 Gastro-esophageal reflux disease without esophagitis: Secondary | ICD-10-CM

## 2012-06-29 LAB — COMPREHENSIVE METABOLIC PANEL
ALT: 8 U/L (ref 0–35)
Albumin: 3.4 g/dL — ABNORMAL LOW (ref 3.5–5.2)
Alkaline Phosphatase: 106 U/L (ref 39–117)
BUN: 28 mg/dL — ABNORMAL HIGH (ref 6–23)
Calcium: 8.8 mg/dL (ref 8.4–10.5)
Potassium: 4.5 mEq/L (ref 3.5–5.1)
Sodium: 134 mEq/L — ABNORMAL LOW (ref 135–145)
Total Protein: 6.9 g/dL (ref 6.0–8.3)

## 2012-06-29 LAB — CBC
MCH: 29.6 pg (ref 26.0–34.0)
MCHC: 31.7 g/dL (ref 30.0–36.0)
RDW: 18.3 % — ABNORMAL HIGH (ref 11.5–15.5)

## 2012-06-29 LAB — PROTIME-INR: Prothrombin Time: 14.3 seconds (ref 11.6–15.2)

## 2012-06-29 MED ORDER — FLEET ENEMA 7-19 GM/118ML RE ENEM
1.0000 | ENEMA | Freq: Once | RECTAL | Status: DC
  Filled 2012-06-29: qty 1

## 2012-06-29 MED ORDER — ALBUTEROL SULFATE (5 MG/ML) 0.5% IN NEBU
2.5000 mg | INHALATION_SOLUTION | Freq: Three times a day (TID) | RESPIRATORY_TRACT | Status: DC
  Administered 2012-06-29 – 2012-07-01 (×6): 2.5 mg via RESPIRATORY_TRACT
  Filled 2012-06-29 (×6): qty 0.5

## 2012-06-29 MED ORDER — POLYETHYLENE GLYCOL 3350 17 G PO PACK
68.0000 g | PACK | Freq: Once | ORAL | Status: AC
  Administered 2012-06-29: 68 g via ORAL
  Filled 2012-06-29: qty 4

## 2012-06-29 MED ORDER — WARFARIN SODIUM 5 MG PO TABS
5.0000 mg | ORAL_TABLET | Freq: Once | ORAL | Status: AC
  Administered 2012-06-29: 5 mg via ORAL
  Filled 2012-06-29: qty 1

## 2012-06-29 MED ORDER — BISACODYL 5 MG PO TBEC
10.0000 mg | DELAYED_RELEASE_TABLET | Freq: Once | ORAL | Status: AC
  Administered 2012-06-29: 10 mg via ORAL
  Filled 2012-06-29 (×2): qty 1

## 2012-06-29 MED ORDER — GABAPENTIN 300 MG PO CAPS
600.0000 mg | ORAL_CAPSULE | Freq: Four times a day (QID) | ORAL | Status: DC
  Administered 2012-06-29 – 2012-07-01 (×9): 600 mg via ORAL
  Filled 2012-06-29 (×12): qty 2

## 2012-06-29 MED FILL — Ropivacaine HCl Inj 5 MG/ML: INTRAMUSCULAR | Qty: 30 | Status: AC

## 2012-06-29 NOTE — Progress Notes (Addendum)
ANTICOAGULATION CONSULT NOTE - Follow Up Consult  Pharmacy Consult for Warfarin Indication: VTE prophylaxis (warfarin PTA for hx Afib)  No Known Allergies  Patient Measurements: Height: 5\' 8"  (172.7 cm) (pt stated) IBW/kg (Calculated) : 63.9   Vital Signs: Temp: 97.8 F (36.6 C) (12/11 0535) Temp src: Oral (12/11 0535) BP: 110/46 mmHg (12/11 0535) Pulse Rate: 72  (12/11 0535)  Labs:  Basename 06/29/12 0446 06/28/12 1205  HGB 9.8* --  HCT 30.9* --  PLT 162 --  APTT -- --  LABPROT 14.3 13.9  INR 1.13 1.08  HEPARINUNFRC -- --  CREATININE 1.68* --  CKTOTAL -- --  CKMB -- --  TROPONINI -- --    The CrCl is unknown because both a height and weight (above a minimum accepted value) are required for this calculation.   Medications:  Scheduled:    . acetaminophen  1,000 mg Intravenous Q6H  . aspirin EC  81 mg Oral Daily  . calcium-vitamin D  1 tablet Oral BID WC  . docusate sodium  100 mg Oral BID  . enoxaparin (LOVENOX) injection  30 mg Subcutaneous Q12H  . ferrous sulfate  325 mg Oral TID PC  . furosemide  40 mg Oral BID  . gabapentin  600 mg Oral QID  . guaiFENesin  600 mg Oral BID  . isosorbide mononitrate  30 mg Oral QHS  . metoprolol tartrate  50 mg Oral BID  . multivitamin with minerals  1 tablet Oral Daily  . pantoprazole  40 mg Oral Daily  . potassium chloride SA  20 mEq Oral BID  . simvastatin  20 mg Oral QHS  . [COMPLETED] vancomycin  1,000 mg Intravenous Q12H  . vitamin B-12  1,000 mcg Oral Daily  . [COMPLETED] warfarin  5 mg Oral Once  . Warfarin - Pharmacist Dosing Inpatient   Does not apply q1800  . [DISCONTINUED]  ceFAZolin (ANCEF) IV  2 g Intravenous 60 min Pre-Op  . [DISCONTINUED] gabapentin  600 mg Oral QID  . [DISCONTINUED] povidone-iodine   Topical Once    Assessment:  71 YOF s/p L TKA. Coumadin per pharmacy for VTE PPX ordered.   Pt was on Coumadin PTA for hx Afib, but was holding warfarin (last dose 12/4) and on Lovenox bridge (12/4 -  12/9) for planned TKA.  Dose PTA = 5mg  Evern Bio Thurs and Sat. 2.5mg  on other days.   INR today is increased, but still subtherapeutic as expected after warfarin re-initiation.  CBC shows acute postop anemia.  No bleeding or complications noted.  Lovenox 30mg  SQ q12h starting 12/11  Goal of Therapy:  INR 2-3 Monitor platelets by anticoagulation protocol: Yes   Plan:  Warfarin 5mg  PO x1 Daily INR   Lynann Beaver PharmD, BCPS Pager [phone] 06/29/2012 11:18 AM

## 2012-06-29 NOTE — Progress Notes (Signed)
Physical Therapy Treatment Patient Details Name: [name] MRN: [id number] DOB: 02/08/1941 Today's Date: 06/29/2012 Time: 7829-5621 PT Time Calculation (min): 37 min  PT Assessment / Plan / Recommendation Comments on Treatment Session  Pt. is impulsive and unsafe during transfers. Discussed w/ pt. that she may consider SNF for getting to maximum mobility prior to DC . will see how she progresses.    Follow Up Recommendations  Home health PT     Does the patient have the potential to tolerate intense rehabilitation     Barriers to Discharge        Equipment Recommendations  Rolling walker with 5" wheels    Recommendations for Other Services    Frequency 7X/week   Plan Discharge plan remains appropriate;Frequency remains appropriate    Precautions / Restrictions Precautions Precaution Comments: limit L Knee ROM 0-40 degrees x 2 weeks Required Braces or Orthoses: Knee Immobilizer - Left Knee Immobilizer - Left: On except when in CPM   Pertinent Vitals/Pain BP 105/51 HR95 sats 95% 2 l.  Not as dizzy, tolerated activity better     Mobility  Bed Mobility Supine to Sit: 4: Min assist;With rails;HOB elevated Details for Bed Mobility Assistance: pt squirms in bed as she gets to edge. Transfers Sit to Stand: 1: +2 Total assist;From chair/3-in-1;From bed;With upper extremity assist Sit to Stand: Patient Percentage: 60% Stand to Sit: With armrests;To chair/3-in-1 Stand to Sit: Patient Percentage: 60% Stand Pivot Transfers: 1: +2 Total assist Stand Pivot Transfers: Patient Percentage: 60% Details for Transfer Assistance: frequent cue to slow down and to wait for therapist. pt lets go of RW, reaches to chair before turning around. Ambulation/Gait Ambulation/Gait Assistance: 1: +2 Total assist Ambulation/Gait: Patient Percentage: 60% Ambulation Distance (Feet): 5 Feet Assistive device: Rolling walker Ambulation/Gait Assistance Details: pt not using safe techniques w/  RW. Gait Pattern: Step-to pattern;Antalgic    Exercises     PT Diagnosis:    PT Problem List:   PT Treatment Interventions:     PT Goals Acute Rehab PT Goals Pt will go Supine/Side to Sit: with supervision PT Goal: Supine/Side to Sit - Progress: Progressing toward goal Pt will go Sit to Stand: with supervision PT Goal: Sit to Stand - Progress: Goal set today Pt will go Stand to Sit: with supervision PT Goal: Stand to Sit - Progress: Progressing toward goal Pt will Ambulate: 16 - 50 feet;with supervision;with rolling walker PT Goal: Ambulate - Progress: Progressing toward goal  Visit Information  Last PT Received On: 06/29/12 Assistance Needed: +2    Subjective Data  Subjective: I need the potty   Cognition  Overall Cognitive Status: Appears within functional limits for tasks assessed/performed Arousal/Alertness: Awake/alert Orientation Level: Appears intact for tasks assessed Behavior During Session: Anxious Cognition - Other Comments: impulsive,    Balance     End of Session PT - End of Session Equipment Utilized During Treatment: Left knee immobilizer Activity Tolerance: Patient limited by fatigue;Patient limited by pain Patient left: in chair;with call bell/phone within reach;with family/visitor present Nurse Communication: Mobility status   GP     Rada Hay 06/29/2012, 4:21 PM

## 2012-06-29 NOTE — Progress Notes (Signed)
[name] PROGRESS NOTE  [name] ZOX:[id number] DOB: February 24, 1941 DOA: 06/28/2012 PCP: Michele Mcalpine, MD  Assessment/Plan:  1. Osteoarthritis of left knee: This is end-stage, and patient is POD 1 s/p Left TKA. Doing well post-operatively.  -  Orthopedics managing post-operative recovery and pain medication 2. Fibromyalgia/Chronic pain: Not problematic at this time.  3. GERD (gastroesophageal reflux disease): Patient is s/p Nissen fundoplication.  Stable.  Continue PPI.  4. COPD (chronic obstructive pulmonary disease): Stable /Asymptomatic but with diminished BS.  Scheduled nebs today which is how patient manages at home.   5. CAD: Stable/Asymptomatic. Patient has no symptoms of chest pain or SOB. Continue beta-blockade, Nitrates and low dose ASA.  6. Atrial fibrillation:  Patient is currently in rate-controlled atrial fibrillation, and has no clinical evidence of CHF.  D/C telemetrically. Continue beta-blocker.  7. Chronic anticoagulation: Manage per clinical pharmacologist. Coumadin was discontinue on 06/23/12. -  Restart coumadin  -  Continue lovenox bridge 8. Dyslipidemia: Continue Statin.  9. CKD: Patient has known CKD-3, creatinine at baseline.  Continue lasix.  10.  Constipation:  Fleets enema followed by miralax 68gm and bisacodyl   TRH will followup again tomorrow.  Please contact me if I can be of assistance in the meanwhile. Thank you for this consultation  DIET:  Regular diet ACCESS:  PIV IVF:  None PROPH:  lovenox  Code Status: full code Family Communication: spoke with patient  Disposition Plan:  To home with home health PT, rolling walker when orthopedics clears for discharge.  Medically stable.    Procedures:  TKA left 12/10  Antibiotics:  Perioperative 12/10.    HPI/Subjective:  Denies CP, shortness of breath, nausea.  She is voiding well, but has not had a BM in 4 days.    Objective: Filed Vitals:   06/29/12 0755 06/29/12 1200 06/29/12  1223 06/29/12 1227  BP:    112/76  Pulse:    86  Temp:    97.8 F (36.6 C)  TempSrc:    Oral  Resp: 18 16  18   Height:      Weight:   108.863 kg (240 lb)   SpO2:    100%    Intake/Output Summary (Last 24 hours) at 06/29/12 1349 Last data filed at 06/29/12 1309  Gross per 24 hour  Intake 4033.75 ml  Output   1850 ml  Net 2183.75 ml   Filed Weights   06/29/12 1223  Weight: 108.863 kg (240 lb)    Exam:   General:  Obese caucasian female, no acute distress, oxygen via nasal canula  HEENT:  MMM  Cardiovascular: RRR, 1/6 murmur at the LSB, 2+ pulses,   Respiratory: Diminished bilateral BS, non respiratory distress or tachypnea  Abdomen: Hypoactive BS, soft, obese, nondistended, mildly tender in the LLQ  MSK:  Mild edema of the feet L>R, < 2 sec CR.    Data Reviewed: Basic Metabolic Panel:  Lab 06/29/12 8119  NA 134*  K 4.5  CL 97  CO2 29  GLUCOSE 114*  BUN 28*  CREATININE 1.68*  CALCIUM 8.8  MG --  PHOS --   Liver Function Tests:  Lab 06/29/12 0446  AST 19  ALT 8  ALKPHOS 106  BILITOT 1.0  PROT 6.9  ALBUMIN 3.4*   No results found for this basename: LIPASE:5,AMYLASE:5 in the last 168 hours No results found for this basename: AMMONIA:5 in the last 168 hours CBC:  Lab 06/29/12 0446  WBC 8.6  NEUTROABS --  HGB 9.8*  HCT 30.9*  MCV 93.4  PLT 162   Cardiac Enzymes: No results found for this basename: CKTOTAL:5,CKMB:5,CKMBINDEX:5,TROPONINI:5 in the last 168 hours BNP (last 3 results)  Basename 12/22/11 1233  PROBNP 179.0*   CBG: No results found for this basename: GLUCAP:5 in the last 168 hours  Recent Results (from the past 240 hour(s))  SURGICAL PCR SCREEN     Status: Abnormal   Collection Time   06/22/12  8:30 AM      Component Value Range Status Comment   MRSA, PCR POSITIVE (*) NEGATIVE Final    Staphylococcus aureus POSITIVE (*) NEGATIVE Final      Studies: No results found.  Scheduled Meds:   . acetaminophen  1,000 mg  Intravenous Q6H  . albuterol  2.5 mg Nebulization TID  . aspirin EC  81 mg Oral Daily  . bisacodyl  10 mg Oral Once  . calcium-vitamin D  1 tablet Oral BID WC  . docusate sodium  100 mg Oral BID  . enoxaparin (LOVENOX) injection  30 mg Subcutaneous Q12H  . ferrous sulfate  325 mg Oral TID PC  . furosemide  40 mg Oral BID  . gabapentin  600 mg Oral QID  . guaiFENesin  600 mg Oral BID  . isosorbide mononitrate  30 mg Oral QHS  . metoprolol tartrate  50 mg Oral BID  . multivitamin with minerals  1 tablet Oral Daily  . pantoprazole  40 mg Oral Daily  . polyethylene glycol  68 g Oral Once  . potassium chloride SA  20 mEq Oral BID  . simvastatin  20 mg Oral QHS  . sodium phosphate  1 enema Rectal Once  . [COMPLETED] vancomycin  1,000 mg Intravenous Q12H  . vitamin B-12  1,000 mcg Oral Daily  . [COMPLETED] warfarin  5 mg Oral Once  . warfarin  5 mg Oral ONCE-1800  . Warfarin - Pharmacist Dosing Inpatient   Does not apply q1800  . [DISCONTINUED]  ceFAZolin (ANCEF) IV  2 g Intravenous 60 min Pre-Op  . [DISCONTINUED] gabapentin  600 mg Oral QID  . [DISCONTINUED] povidone-iodine   Topical Once   Continuous Infusions:   . [DISCONTINUED] sodium chloride    . [DISCONTINUED] sodium chloride 75 mL/hr at 06/28/12 1901  . [DISCONTINUED] lactated ringers 100 mL/hr at 06/28/12 1847    Active Problems:  Osteoarthritis of left knee  Chronic anticoagulation  Fibromyalgia  Chronic pain  GERD (gastroesophageal reflux disease)  COPD (chronic obstructive pulmonary disease)    Time spent: 30    Shainna Faux, [facility]  [name] Pager [phone]. If 8PM-8AM, please contact night-coverage at [email or web address], password [email or web address] 06/29/2012, 1:49 PM  LOS: 1 day

## 2012-06-29 NOTE — Progress Notes (Signed)
Utilization review completed.  

## 2012-06-29 NOTE — Evaluation (Signed)
Physical Therapy Evaluation Patient Details Name: [name] MRN: [id number] DOB: 11/27/1940 Today's Date: 06/29/2012 Time: 4098-1191 PT Time Calculation (min): 49 min  PT Assessment / Plan / Recommendation Clinical Impression  Pt. admitted 12/10 for LTKA. Pt. did get up with 2 person assist to [facility]. Pt. felt fainty and returned to bed. Pt. plans to DC to home with spouse assistance. Pt. does have WC and ramp if ambulation is difficult., Has a  BSC. Pt is requesting new RW. Pt. has h/o fibomyalgia, c/o L shoulder and wlbow pain. Pt. will benefit from PT while on acute care.     PT Assessment  Patient needs continued PT services    Follow Up Recommendations  Home health PT;Supervision/Assistance - 24 hour    Does the patient have the potential to tolerate intense rehabilitation      Barriers to Discharge        Equipment Recommendations  Rolling walker with 5" wheels    Recommendations for Other Services OT consult   Frequency 7X/week    Precautions / Restrictions Precautions Precautions: Knee Precaution Comments: limit L Knee ROM 0-40 degrees x 2 weeks, Required Braces or Orthoses: Knee Immobilizer - Left Knee Immobilizer - Left: On except when in CPM Restrictions Weight Bearing Restrictions: No   Pertinent Vitals/Pain BP after return to bed 111/81, sats 96% 2l, pt diaphoretic.      Mobility  Bed Mobility Bed Mobility: Supine to Sit;Sit to Supine Supine to Sit: 3: Mod assist Sit to Supine: 3: Mod assist Details for Bed Mobility Assistance: assist for support LLE due to increased pain on lateral aspect. Transfers Transfers: Sit to Stand;Stand to Sit;Stand Pivot Transfers Sit to Stand: 1: +2 Total assist;From chair/3-in-1;From bed;With upper extremity assist Sit to Stand: Patient Percentage: 60% Stand to Sit: To chair/3-in-1;To bed;1: +2 Total assist Stand to Sit: Patient Percentage: 50% Stand Pivot Transfers: 1: +2 Total assist Stand Pivot Transfers: Patient  Percentage: 60% Details for Transfer Assistance: cues for weight bear, pt. was more Ambulation/Gait Ambulation/Gait Assistance: Not tested (comment) Assistive device: Rolling walker    Shoulder Instructions     Exercises Total Joint Exercises Quad Sets: AROM;Left;10 reps Straight Leg Raises: AAROM;Left;10 reps   PT Diagnosis: Difficulty walking;Generalized weakness;Acute pain  PT Problem List: Decreased strength;Decreased range of motion;Decreased activity tolerance;Decreased balance;Decreased mobility;Decreased coordination;Decreased knowledge of use of DME;Pain PT Treatment Interventions: DME instruction;Gait training;Functional mobility training;Therapeutic activities;Therapeutic exercise;Patient/family education   PT Goals Acute Rehab PT Goals PT Goal Formulation: With patient/family Time For Goal Achievement: 07/13/12 Potential to Achieve Goals: Good Pt will go Supine/Side to Sit: with supervision PT Goal: Supine/Side to Sit - Progress: Goal set today Pt will go Sit to Supine/Side: with supervision PT Goal: Sit to Supine/Side - Progress: Goal set today Pt will go Stand to Sit: with supervision PT Goal: Stand to Sit - Progress: Goal set today Pt will Ambulate: 16 - 50 feet;with supervision;with rolling walker PT Goal: Ambulate - Progress: Goal set today Pt will Perform Home Exercise Program: with min assist PT Goal: Perform Home Exercise Program - Progress: Goal set today  Visit Information  Last PT Received On: 06/29/12 Assistance Needed: +2    Subjective Data  Subjective: The doctor said not to bend my knee. Patient Stated Goal: to go home   Prior Functioning  Home Living Lives With: Spouse Available Help at Discharge: Family Type of Home: House Home Access: Ramped entrance Home Layout: One level Bathroom Toilet: Handicapped height Home Adaptive Equipment: Bedside commode/3-in-1;Walker - rolling;Wheelchair -  manual Prior Function Level of Independence:  Independent with assistive device(s) Vocation: Retired Musician: No difficulties    Cognition  Overall Cognitive Status: Appears within functional limits for tasks assessed/performed Arousal/Alertness: Awake/alert Orientation Level: Appears intact for tasks assessed Behavior During Session: [facility] for tasks performed    Extremity/Trunk Assessment Right Lower Extremity Assessment RLE ROM/Strength/Tone: Deficits RLE ROM/Strength/Tone Deficits: has knee joint pain but able to bear weight to stand. RLE Sensation: History of peripheral neuropathy Left Lower Extremity Assessment LLE ROM/Strength/Tone: Deficits LLE ROM/Strength/Tone Deficits: pt is able to perform slr X 5 INCHES WITH MIN assist LLE Sensation: History of peripheral neuropathy Trunk Assessment Trunk Assessment: Normal   Balance    End of Session PT - End of Session Activity Tolerance: Patient limited by fatigue;Patient limited by pain;Treatment limited secondary to medical complications (Comment) (dizzy and diaphoretic.) Patient left: in bed;with call bell/phone within reach;with family/visitor present Nurse Communication: Mobility status (dizziness.)  GP     Rada Hay 06/29/2012, 12:12 PM  [phone]

## 2012-06-29 NOTE — Progress Notes (Signed)
Physical Therapy Treatment Patient Details Name: [name] MRN: [id number] DOB: [birth year] Today's Date: 06/29/2012 Time: 7846-9629 PT Time Calculation (min): 18 min  PT Assessment / Plan / Recommendation Comments on Treatment Session  Pt. continues to slowly improve. Has not been able to ambulate.     Follow Up Recommendations  Home health PT     Does the patient have the potential to tolerate intense rehabilitation     Barriers to Discharge        Equipment Recommendations  Rolling walker with 5" wheels    Recommendations for Other Services    Frequency 7X/week   Plan Discharge plan remains appropriate;Frequency remains appropriate    Precautions / Restrictions Precautions Precautions: Knee Precaution Comments: limit L Knee ROM 0-40 degrees x 2 weeks Required Braces or Orthoses: Knee Immobilizer - Left Knee Immobilizer - Left: On except when in CPM   Pertinent Vitals/Pain 8 l knee, after meds, Ice.    Mobility  Bed Mobility  Sit to Supine: 3: Mod assist Details for Bed Mobility Assistance: assist for LLE onto bed. Transfers Sit to Stand: From chair/3-in-1;1: +2 Total assist Sit to Stand: Patient Percentage: 70% Stand to Sit: To bed;1: +2 Total assist Stand to Sit: Patient Percentage: 70% Stand Pivot Transfers: 1: +2 Total assist Stand Pivot Transfers: Patient Percentage: 60% Details for Transfer Assistance: continues to be impulsive. Ambulation/Gait Ambulation/Gait Assistance: Not tested (comment) Ambulation/Gait: Patient Percentage: 60% Ambulation Distance (Feet): 5 Feet Assistive device: Rolling walker Ambulation/Gait Assistance Details: pt not using safe techniques w/ RW. Gait Pattern: Step-to pattern;Antalgic    Exercises     PT Diagnosis:    PT Problem List:   PT Treatment Interventions:     PT Goals Acute Rehab PT Goals Pt will go Supine/Side to Sit: with supervision PT Goal: Supine/Side to Sit - Progress: Progressing toward goal Pt  will go Sit to Supine/Side: with supervision PT Goal: Sit to Supine/Side - Progress: Progressing toward goal Pt will go Sit to Stand: with supervision PT Goal: Sit to Stand - Progress: Progressing toward goal Pt will go Stand to Sit: with supervision PT Goal: Stand to Sit - Progress: Progressing toward goal Pt will Ambulate: 16 - 50 feet;with supervision;with rolling walker PT Goal: Ambulate - Progress: Progressing toward goal  Visit Information  Last PT Received On: 06/29/12 Assistance Needed: +2    Subjective Data  Subjective: I want to get back in bed.   Cognition  Overall Cognitive Status: Appears within functional limits for tasks assessed/performed Arousal/Alertness: Awake/alert Orientation Level: Appears intact for tasks assessed Behavior During Session: Anxious Cognition - Other Comments: impulsive,    Balance     End of Session PT - End of Session Equipment Utilized During Treatment: Left knee immobilizer Activity Tolerance: Patient limited by fatigue;Patient limited by pain Patient left: in bed;with call bell/phone within reach;with family/visitor present Nurse Communication: Mobility status   GP     Rada Hay 06/29/2012, 5:39 PM

## 2012-06-29 NOTE — Progress Notes (Signed)
Subjective: Patient states that her knee is on the sore side since they took the ice off of the last night but otherwise she has no other complaints of shortness of breath chest pain or nausea   Objective: Vital signs in last 24 hours: Temp:  [97.6 F (36.4 C)-98.6 F (37 C)] 97.8 F (36.6 C) (12/11 0535) Pulse Rate:  [57-126] 72  (12/11 0535) Resp:  [16-24] 18  (12/11 0535) BP: (107-143)/(43-83) 110/46 mmHg (12/11 0535) SpO2:  [92 %-100 %] 100 % (12/11 0535)  Intake/Output from previous day: 12/10 0701 - 12/11 0700 In: 2953.8 [I.V.:2248.8; IV Piggyback:705] Out: 1250 [Urine:1150; Drains:100] Intake/Output this shift: Total I/O In: 698.8 [I.V.:298.8; IV Piggyback:400] Out: 950 [Urine:850; Drains:100]   Basename 06/29/12 0446  HGB 9.8*    Basename 06/29/12 0446  WBC 8.6  RBC 3.31*  HCT 30.9*  PLT 162    Basename 06/29/12 0446  NA 134*  K 4.5  CL 97  CO2 29  BUN 28*  CREATININE 1.68*  GLUCOSE 114*  CALCIUM 8.8    Basename 06/29/12 0446 06/28/12 1205  LABPT -- --  INR 1.13 1.08    Patient is conscious alert and appropriate laying in bed talking with her son appears to be in no extreme distress. Lungs were equal from side to side without any wheezing heart is slightly irregular abdomen is soft bowel sounds present. Left knee dressing is intact Hemovac was then placed it was DC'd intact calf is soft and nontender foot is neuromotor vascularly intact  Assessment/Plan: Postop day #1 status post left total knee arthroplasty Acute postoperative blood loss anemia asymptomatic we'll continue to monitor Mild hyponatremia asymptomatic we'll monitor COPD asymptomatic Chronic A. fib  Plan appreciate medical hospitalist management of her multiple medical issues. At this time her to have her proceed with the total knee arthroplasty protocol with one exception she is not to go past 40 flexion for the first 2 weeks she'll start physical therapy this morning and the CPM 0-40  with no increased motion. Will follow hospitalist recommendations for medical management.   Jamelle Rushing 06/29/2012, 6:46 AM

## 2012-06-30 DIAGNOSIS — M1990 Unspecified osteoarthritis, unspecified site: Secondary | ICD-10-CM

## 2012-06-30 DIAGNOSIS — D62 Acute posthemorrhagic anemia: Secondary | ICD-10-CM

## 2012-06-30 DIAGNOSIS — D638 Anemia in other chronic diseases classified elsewhere: Secondary | ICD-10-CM | POA: Diagnosis present

## 2012-06-30 LAB — CBC
MCH: 29 pg (ref 26.0–34.0)
MCHC: 31.4 g/dL (ref 30.0–36.0)
Platelets: 117 10*3/uL — ABNORMAL LOW (ref 150–400)
RDW: 17.9 % — ABNORMAL HIGH (ref 11.5–15.5)

## 2012-06-30 LAB — PROTIME-INR
INR: 1.24 (ref 0.00–1.49)
Prothrombin Time: 15.4 seconds — ABNORMAL HIGH (ref 11.6–15.2)

## 2012-06-30 MED ORDER — ACETAMINOPHEN 325 MG PO TABS
650.0000 mg | ORAL_TABLET | Freq: Four times a day (QID) | ORAL | Status: DC | PRN
  Administered 2012-07-01: 650 mg via ORAL
  Filled 2012-06-30 (×2): qty 2

## 2012-06-30 MED ORDER — ENSURE COMPLETE PO LIQD
237.0000 mL | Freq: Three times a day (TID) | ORAL | Status: DC
  Administered 2012-06-30 – 2012-07-01 (×2): 237 mL via ORAL

## 2012-06-30 MED ORDER — WARFARIN SODIUM 5 MG PO TABS
5.0000 mg | ORAL_TABLET | Freq: Once | ORAL | Status: AC
  Administered 2012-06-30: 5 mg via ORAL
  Filled 2012-06-30: qty 1

## 2012-06-30 MED ORDER — FUROSEMIDE 20 MG PO TABS
20.0000 mg | ORAL_TABLET | Freq: Two times a day (BID) | ORAL | Status: DC
  Administered 2012-06-30 – 2012-07-01 (×2): 20 mg via ORAL
  Filled 2012-06-30 (×4): qty 1

## 2012-06-30 NOTE — Progress Notes (Signed)
Physical Therapy Treatment Patient Details Name: [name] MRN: [id number] DOB: 04/14/1941 Today's Date: 06/30/2012 Time: 1308-6578 PT Time Calculation (min): 39 min  PT Assessment / Plan / Recommendation Comments on Treatment Session  POD # 2 am session L TKR.  Pt requires MAX encouragement to increase self assist and decrease fear/anxiety.  Assisted pt OOB to amb in hallway. Spouse assisted by following with chair as pt was unsteady, nervous and wanting to sit. Performed all supine TKR TE's then applied ICE.Spouse plans to assist pt at home.    Follow Up Recommendations  Home health PT     Does the patient have the potential to tolerate intense rehabilitation     Barriers to Discharge        Equipment Recommendations  Rolling walker with 5" wheels    Recommendations for Other Services    Frequency 7X/week   Plan Discharge plan remains appropriate    Precautions / Restrictions Precautions Precautions: Knee Precaution Comments: KI all times, no knee flex >40' Required Braces or Orthoses: Knee Immobilizer - Left Knee Immobilizer - Left: On except when in CPM Restrictions Weight Bearing Restrictions: No LLE Weight Bearing: Weight bearing as tolerated   Pertinent Vitals/Pain C/o 7/10 pain with TE's ICE applied    Mobility  Bed Mobility Bed Mobility: Supine to Sit Supine to Sit: 5: Supervision Details for Bed Mobility Assistance: Pt impulsive and reqiures cueing on safety such as making sure KI is on coerrect and snug before getting up Transfers Transfers: Sit to Stand;Stand to Sit Sit to Stand: 4: Min assist;From bed Stand to Sit: 4: Min guard Details for Transfer Assistance: 75% VC's on safety with hand placement, proper use of RW and turn completion prior to sitting as pt is impulsive. Ambulation/Gait Ambulation/Gait Assistance: 4: Min assist Ambulation Distance (Feet): 65 Feet Assistive device: Rolling walker Ambulation/Gait Assistance Details: 75% VC's to  decrease anxiety/fear of falling and increase amb distance plus upright posture with proper walker to self distance.  Spouse assisted by following with chair. Gait Pattern: Step-to pattern;Decreased stance time - left;Trunk flexed;Ataxic Gait velocity: decreased    Exercises Total Joint Exercises Ankle Circles/Pumps: AROM;Both;10 reps;Supine Quad Sets: AROM;Both;10 reps;Supine Gluteal Sets: AROM;Both;10 reps;Supine Towel Squeeze: AROM;Both;10 reps;Supine Heel Slides: AROM;Left;10 reps;Supine Hip ABduction/ADduction: AROM;Left;10 reps;Supine Straight Leg Raises: AAROM;Left;10 reps;Supine   PT Goals                                                             progressing    Visit Information  Last PT Received On: 06/30/12    Subjective Data  Subjective: I can't walk Patient Stated Goal: home   Cognition       Balance     End of Session PT - End of Session Equipment Utilized During Treatment: Gait belt;Left lower extremity prosthesis Activity Tolerance: Patient limited by fatigue Patient left: in chair;with call bell/phone within reach (ICE to hip knee)   Felecia Shelling  PTA [facility]  Acute  Rehab Pager     [phone]

## 2012-06-30 NOTE — Progress Notes (Addendum)
[name] PROGRESS NOTE  [name] ZOX:[id number] DOB: 05-17-1941 DOA: 06/28/2012 PCP: Michele Mcalpine, MD  Assessment/Plan: :  1. Osteoarthritis of left knee: This is end-stage, and patient is POD 2 s/p Left TKA. Doing well post-operatively.  - Orthopedics managing post-operative recovery and pain medication   2. Fibromyalgia/Chronic pain: Not problematic at this time.   3. GERD (gastroesophageal reflux disease): Patient is s/p Nissen fundoplication. Stable. Continue PPI.   4. COPD (chronic obstructive pulmonary disease): Stable /Asymptomatic but with diminished BS. Cont nebs  5. CAD: Stable/Asymptomatic. Patient has no symptoms of chest pain or SOB. Continue beta-blockade, Nitrates and low dose ASA.   6. Atrial fibrillation: Patient is currently in rate-controlled atrial fibrillation, and has no clinical evidence of CHF. D/C telemetrically. Continue beta-blocker. BNP 1984  7. Chronic anticoagulation: Manage per clinical pharmacologist. Coumadin was discontinue on 06/23/12.   - cont coumadin, INR 1.24  - Continue lovenox bridge   8. Dyslipidemia: Continue Statin.   9. CKD: Patient has known CKD-3, creatinine at baseline. Continue lasix.   10. Constipation: Fleets enema followed by miralax 68gm and bisacodyl   11. ACD > HB 9.4, will monitor with cbc  12. Hyponatremia, NA 134 on 12/11, will follow CMP  13. CKD, creat 1.68, seems at baseline, decreased lasix to 20 mg po daily, monitor closely  14. Mild PCM, albumin 3.4, encourage supplements.   DIET: Regular diet  ACCESS: PIV  IVF: None  PROPH: lovenox   Code Status: full code  Family Communication: spoke with patient  Disposition Plan: To home with home health PT, rolling walker when orthopedics clears for discharge. Medically stable.   Procedures:  TKA left 12/10   Antibiotics:  Perioperative 12/10.   HPI/Subjective: Patient denies any fever, chills, n/v/d/CP/SOb or cough production. Has not had  BM so far. Has knee pain which is fluctuating and feels that it may be getting worse.   Objective: Filed Vitals:   06/29/12 2041 06/29/12 2059 06/30/12 0612 06/30/12 0800  BP: 103/52  127/64   Pulse: 95  102   Temp: 98 F (36.7 C)  99.3 F (37.4 C)   TempSrc: Oral  Oral   Resp: 18  18 16   Height:      Weight:      SpO2: 100% 96% 100% 97%    Intake/Output Summary (Last 24 hours) at 06/30/12 1154 Last data filed at 06/30/12 0900  Gross per 24 hour  Intake    840 ml  Output   3525 ml  Net  -2685 ml   Filed Weights   06/29/12 1223  Weight: 108.863 kg (240 lb)    Exam: General: Obese caucasian female, no acute distress, oxygen via nasal canula  HEENT: MMM  Cardiovascular: RRR, 1/6 murmur at the LSB, 2+ pulses,  Respiratory: Diminished bilateral BS, non respiratory distress or tachypnea  Abdomen: Hypoactive BS, soft, obese, nondistended, mildly tender in the LLQ  MSK: Mild edema of the feet L>R, < 2 sec CR.     Data Reviewed: Basic Metabolic Panel:  Lab 06/29/12 8119  NA 134*  K 4.5  CL 97  CO2 29  GLUCOSE 114*  BUN 28*  CREATININE 1.68*  CALCIUM 8.8  MG --  PHOS --   Liver Function Tests:  Lab 06/29/12 0446  AST 19  ALT 8  ALKPHOS 106  BILITOT 1.0  PROT 6.9  ALBUMIN 3.4*   No results found for this basename: LIPASE:5,AMYLASE:5 in the last 168 hours No results found for this  basename: AMMONIA:5 in the last 168 hours CBC:  Lab 06/30/12 0459 06/29/12 0446  WBC 8.6 8.6  NEUTROABS -- --  HGB 9.4* 9.8*  HCT 29.9* 30.9*  MCV 92.3 93.4  PLT 117* 162   Cardiac Enzymes: No results found for this basename: CKTOTAL:5,CKMB:5,CKMBINDEX:5,TROPONINI:5 in the last 168 hours BNP (last 3 results)  Basename 06/29/12 0446 12/22/11 1233  PROBNP 1984.0* 179.0*   CBG: No results found for this basename: GLUCAP:5 in the last 168 hours  Recent Results (from the past 240 hour(s))  SURGICAL PCR SCREEN     Status: Abnormal   Collection Time   06/22/12  8:30 AM       Component Value Range Status Comment   MRSA, PCR POSITIVE (*) NEGATIVE Final    Staphylococcus aureus POSITIVE (*) NEGATIVE Final      Studies: No results found.  Scheduled Meds:   . [COMPLETED] acetaminophen  1,000 mg Intravenous Q6H  . albuterol  2.5 mg Nebulization TID  . aspirin EC  81 mg Oral Daily  . [COMPLETED] bisacodyl  10 mg Oral Once  . calcium-vitamin D  1 tablet Oral BID WC  . docusate sodium  100 mg Oral BID  . enoxaparin (LOVENOX) injection  30 mg Subcutaneous Q12H  . ferrous sulfate  325 mg Oral TID PC  . furosemide  40 mg Oral BID  . gabapentin  600 mg Oral QID  . guaiFENesin  600 mg Oral BID  . isosorbide mononitrate  30 mg Oral QHS  . metoprolol tartrate  50 mg Oral BID  . multivitamin with minerals  1 tablet Oral Daily  . pantoprazole  40 mg Oral Daily  . [COMPLETED] polyethylene glycol  68 g Oral Once  . potassium chloride SA  20 mEq Oral BID  . simvastatin  20 mg Oral QHS  . sodium phosphate  1 enema Rectal Once  . vitamin B-12  1,000 mcg Oral Daily  . [COMPLETED] warfarin  5 mg Oral ONCE-1800  . warfarin  5 mg Oral ONCE-1800  . Warfarin - Pharmacist Dosing Inpatient   Does not apply q1800   Continuous Infusions:   Active Problems:  Osteoarthritis of left knee  Chronic anticoagulation  Fibromyalgia  Chronic pain  GERD (gastroesophageal reflux disease)  COPD (chronic obstructive pulmonary disease)    Time spent: 35 min   Jazzmine Kleiman V.  [name] Pager [phone]. If 8PM-8AM, please contact night-coverage at [email or web address], password [facility] 06/30/2012, 11:54 AM  LOS: 2 days

## 2012-06-30 NOTE — Progress Notes (Signed)
Report given for transfer will give medication and then take patient up. Erskin Burnet RN

## 2012-06-30 NOTE — Progress Notes (Signed)
Physical Therapy Treatment Patient Details Name: [name] MRN: [id number] DOB: 1941-02-27 Today's Date: 06/30/2012 Time: 4098-1191 PT Time Calculation (min): 24 min  PT Assessment / Plan / Recommendation Comments on Treatment Session  POD # 2 pm session.  Amb pt again with spouse following with recliner as pt demonstrates decreased safety cognition, reaching outside of her BOS and sitting before she completes her turns.  Spouse states he can assist her and plans are for D/C to home.  Educated spouse on safe handling techniques and  use of KI at all times.    Follow Up Recommendations  Home health PT     Does the patient have the potential to tolerate intense rehabilitation     Barriers to Discharge        Equipment Recommendations  Rolling walker with 5" wheels    Recommendations for Other Services    Frequency 7X/week   Plan Discharge plan remains appropriate    Precautions / Restrictions Precautions Precautions: Knee Precaution Comments: KI all times, no knee flex >40' Required Braces or Orthoses: Knee Immobilizer - Left Knee Immobilizer - Left: On except when in CPM Restrictions Weight Bearing Restrictions: No LLE Weight Bearing: Weight bearing as tolerated   Pertinent Vitals/Pain C/o fatigue    Mobility  Bed Mobility Bed Mobility: Not assessed Supine to Sit: 5: Supervision Details for Bed Mobility Assistance: Pt OOB in recliner Transfers Transfers: Sit to Stand;Stand to Sit Sit to Stand: 4: Min assist;From chair/3-in-1;From toilet Stand to Sit: 4: Min assist;To toilet;To chair/3-in-1 Details for Transfer Assistance: 75% VC's on safety with hand placement, proper use of RW and turn completion prior to sitting as pt is impulsive Ambulation/Gait Ambulation/Gait Assistance: 4: Min assist Ambulation Distance (Feet): 85 Feet Assistive device: Rolling walker Ambulation/Gait Assistance Details: 75% VC's to decrease anxiety/fear of falling and increase amb  distance plus upright posture with proper walker to self distance. Spouse assisted by following with chair.  Gait Pattern: Step-to pattern;Decreased stance time - left Gait velocity: decreased         PT Goals                                                       progressing    Visit Information  Last PT Received On: 06/30/12 Assistance Needed: +2    Subjective Data  Subjective: I can't walk Patient Stated Goal: home   Cognition    decreased safety cognition   Balance   poor  End of Session PT - End of Session Equipment Utilized During Treatment: Gait belt;Left knee immobilizer Activity Tolerance: Patient tolerated treatment well Patient left: in chair;with call bell/phone within reach;with family/visitor present   Felecia Shelling  PTA [facility]  Acute  Rehab Pager     [phone]

## 2012-06-30 NOTE — Evaluation (Signed)
Occupational Therapy Evaluation Patient Details Name: [name] MRN: [id number] DOB: [birth year] Today's Date: 06/30/2012 Time: 3086-5784 OT Time Calculation (min): 27 min  OT Assessment / Plan / Recommendation Clinical Impression  Pt is a 71 y/o female s/p LTKR now presenting with deficits with ADL's and functional mobility and transfers whom should benefit from OT for ADL & a/e retraining prior to pt anticipated d/c home with PRN husband assist. Pt does not have any DME needs for OT    OT Assessment  Patient needs continued OT Services    Follow Up Recommendations  Home health OT;Supervision/Assistance - 24 hour (Discussed SNF, pt declined, plans to return home)    Barriers to Discharge      Equipment Recommendations  None recommended by OT    Recommendations for Other Services    Frequency  Min 2X/week    Precautions / Restrictions Precautions Precautions: Knee Precaution Comments: limit L Knee ROM 0-40 degrees x 2 weeks Required Braces or Orthoses: Knee Immobilizer - Left Knee Immobilizer - Left: On except when in CPM Restrictions Weight Bearing Restrictions: Yes LLE Weight Bearing: Weight bearing as tolerated   Pertinent Vitals/Pain Pt reports 6/10 L knee pain, states that she was premedicated prior to assessment/treatment today.    ADL  Eating/Feeding: Simulated;Modified independent Where Assessed - Eating/Feeding: Bed level Grooming: Performed;Wash/dry hands;Set up Where Assessed - Grooming: Supine, head of bed up Upper Body Bathing: Simulated;Set up;Min guard Where Assessed - Upper Body Bathing: Unsupported sitting Lower Body Bathing: Simulated;Maximal assistance Where Assessed - Lower Body Bathing: Unsupported sitting Upper Body Dressing: Simulated;Set up;Minimal assistance Where Assessed - Upper Body Dressing: Unsupported sitting Lower Body Dressing: Performed;Moderate assistance Where Assessed - Lower Body Dressing: Supine, head of bed up;Unsupported  sitting;Supported sit to stand Toilet Transfer: Simulated;Moderate assistance Toilet Transfer Method: Sit to stand;Stand pivot Toilet Transfer Equipment: Extra wide bedside commode Toileting - Clothing Manipulation and Hygiene: Simulated;Moderate assistance Where Assessed - Toileting Clothing Manipulation and Hygiene: Sit to stand from 3-in-1 or toilet;Sit on 3-in-1 or toilet Tub/Shower Transfer Method: Not assessed Equipment Used: Gait belt;Rolling walker;Knee Immobilizer Transfers/Ambulation Related to ADLs: Pt somewhat impulsive during functional mobility noted, moves quickly. ADL Comments: Pt is a 71 y/o female s/p LTKR now presenting with deficits with ADL's and functional mobility and transfers whom should benefit from OT tfor ADL & a/e retraining prior to pt anticipated d/c home with PRN husband assist. Pt does not have any DME needs for OT.    OT Diagnosis: Generalized weakness;Acute pain  OT Problem List: Decreased activity tolerance;Decreased knowledge of use of DME or AE;Pain;Decreased strength;Obesity OT Treatment Interventions: Self-care/ADL training;DME and/or AE instruction;Energy conservation;Therapeutic activities;Patient/family education   OT Goals Acute Rehab OT Goals OT Goal Formulation: With patient Time For Goal Achievement: 07/14/12 Potential to Achieve Goals: Good ADL Goals Pt Will Perform Grooming: with modified independence;with set-up;Unsupported;Sitting, edge of bed;Sitting at sink ADL Goal: Grooming - Progress: Goal set today Pt Will Perform Upper Body Bathing: with set-up;with modified independence;Unsupported;Sitting, chair;Sitting, edge of bed;Sitting at sink ADL Goal: Upper Body Bathing - Progress: Goal set today Pt Will Perform Lower Body Bathing: with min assist;Sit to stand from chair;Sit to stand from bed;Sitting at sink;with adaptive equipment ADL Goal: Lower Body Bathing - Progress: Goal set today Pt Will Perform Upper Body Dressing: with set-up;with  modified independence;Unsupported;Sitting, bed;Sitting, chair ADL Goal: Upper Body Dressing - Progress: Goal set today Pt Will Perform Lower Body Dressing: with min assist;with adaptive equipment;Sit to stand from chair;Sit to stand from bed;with  caregiver independent in assisting ADL Goal: Lower Body Dressing - Progress: Goal set today Pt Will Transfer to Toilet: with min assist;Extra wide 3-in-1;with caregiver independent in assisting;Ambulation ADL Goal: Toilet Transfer - Progress: Goal set today Pt Will Perform Toileting - Clothing Manipulation: with min assist;with caregiver independent in assisting;Sitting on 3-in-1 or toilet ADL Goal: Toileting - Clothing Manipulation - Progress: Goal set today Pt Will Perform Toileting - Hygiene: with modified independence;Sitting on 3-in-1 or toilet ADL Goal: Toileting - Hygiene - Progress: Goal set today  Visit Information  Last OT Received On: 06/30/12    Subjective Data  Subjective: Pt reports that she plans to return home w/ PRN assist from husband Patient Stated Goal: return home   Prior Functioning     Home Living Lives With: Spouse Available Help at Discharge: Family Type of Home: House Home Access: Ramped entrance Home Layout: One level Bathroom Shower/Tub: Engineer, manufacturing systems: Handicapped height Bathroom Accessibility: Yes How Accessible: Accessible via walker Home Adaptive Equipment: Bedside commode/3-in-1;Walker - rolling;Wheelchair - Camera operator Prior Function Level of Independence: Independent with assistive device(s) Vocation: Retired Musician: No difficulties Dominant Hand: Right    Vision/Perception  Wears glasses for reading   Cognition  Overall Cognitive Status: Appears within functional limits for tasks assessed/performed Arousal/Alertness: Awake/alert Orientation Level: Appears intact for tasks assessed Behavior During Session: [facility] for tasks performed     Extremity/Trunk Assessment Right Upper Extremity Assessment RUE ROM/Strength/Tone: Within functional levels;WFL for tasks assessed Left Upper Extremity Assessment LUE ROM/Strength/Tone: Within functional levels;WFL for tasks assessed     Mobility Bed Mobility Bed Mobility: Supine to Sit Supine to Sit: 5: Supervision Sit to Supine: 4: Min assist Transfers Transfers: Sit to Stand Sit to Stand: From bed;With upper extremity assist;4: Min assist;3: Mod assist;From elevated surface Stand to Sit: 4: Min assist;Without upper extremity assist;To bed;To elevated surface Details for Transfer Assistance: Pt somewht impulsive during sit-stand and stand-sit noted. Moves quickly                 End of Session OT - End of Session Equipment Utilized During Treatment: Gait belt;Left knee immobilizer;Other (comment) (RW) Activity Tolerance: Patient tolerated treatment well Patient left: in bed;with call bell/phone within reach;with family/visitor present  GO     [name] 06/30/2012, 11:51 AM

## 2012-06-30 NOTE — Progress Notes (Signed)
ANTICOAGULATION CONSULT NOTE - Follow Up Consult  Pharmacy Consult for Warfarin Indication: VTE prophylaxis (warfarin PTA for hx Afib)  No Known Allergies  Patient Measurements: Height: 5\' 8"  (172.7 cm) (pt stated) Weight: 240 lb (108.863 kg) IBW/kg (Calculated) : 63.9   Vital Signs: Temp: 99.3 F (37.4 C) (12/12 0612) Temp src: Oral (12/12 0612) BP: 127/64 mmHg (12/12 0612) Pulse Rate: 102  (12/12 0612)  Labs:  Basename 06/30/12 0459 06/29/12 0446 06/28/12 1205  HGB 9.4* 9.8* --  HCT 29.9* 30.9* --  PLT 117* 162 --  APTT -- -- --  LABPROT 15.4* 14.3 13.9  INR 1.24 1.13 1.08  HEPARINUNFRC -- -- --  CREATININE -- 1.68* --  CKTOTAL -- -- --  CKMB -- -- --  TROPONINI -- -- --    Estimated Creatinine Clearance: 40.3 ml/min (by C-G formula based on Cr of 1.68).   Medications:  Scheduled:     . [COMPLETED] acetaminophen  1,000 mg Intravenous Q6H  . albuterol  2.5 mg Nebulization TID  . aspirin EC  81 mg Oral Daily  . [COMPLETED] bisacodyl  10 mg Oral Once  . calcium-vitamin D  1 tablet Oral BID WC  . docusate sodium  100 mg Oral BID  . enoxaparin (LOVENOX) injection  30 mg Subcutaneous Q12H  . ferrous sulfate  325 mg Oral TID PC  . furosemide  40 mg Oral BID  . gabapentin  600 mg Oral QID  . guaiFENesin  600 mg Oral BID  . isosorbide mononitrate  30 mg Oral QHS  . metoprolol tartrate  50 mg Oral BID  . multivitamin with minerals  1 tablet Oral Daily  . pantoprazole  40 mg Oral Daily  . [COMPLETED] polyethylene glycol  68 g Oral Once  . potassium chloride SA  20 mEq Oral BID  . simvastatin  20 mg Oral QHS  . sodium phosphate  1 enema Rectal Once  . vitamin B-12  1,000 mcg Oral Daily  . [COMPLETED] warfarin  5 mg Oral ONCE-1800  . Warfarin - Pharmacist Dosing Inpatient   Does not apply q1800    Assessment:  71 YOF s/p L TKA. Coumadin per pharmacy for VTE PPX ordered.   Pt was on warfarin PTA for hx Afib, but was holding warfarin (last dose 12/4) and on  Lovenox bridge (12/4 - 12/9) for planned TKA.  Dose PTA = 5mg  Wynelle Link Tues Thurs and Sat. 2.5mg  on other days.   INR today is increased, but still subtherapeutic (1.24) as expected after warfarin re-initiation.  CBC shows acute postop anemia.  No bleeding or complications noted.  Lovenox 30mg  SQ q12h starting 12/11  Goal of Therapy:  INR 2-3 Monitor platelets by anticoagulation protocol: Yes   Plan:   Warfarin 5mg  PO x1  Continue Lovenox 30mg  SQ q12h as ordered  Daily INR   Lynann Beaver PharmD, BCPS Pager [phone] 06/30/2012 7:43 AM

## 2012-06-30 NOTE — Progress Notes (Signed)
Subjective: Patient's only complaint is a sore left knee. She is no shortness breath nausea. She did have some nausea yesterday. Does feel like she doing better today she felt like she did well yesterday she walked about 5 feet with physical therapy and his son about an hour out of bed yesterday in a bedside chair   Objective: Vital signs in last 24 hours: Temp:  [97 F (36.1 C)-99.3 F (37.4 C)] 99.3 F (37.4 C) (12/12 0612) Pulse Rate:  [86-102] 102  (12/12 0612) Resp:  [16-18] 18  (12/12 0612) BP: (103-127)/(52-83) 127/64 mmHg (12/12 0612) SpO2:  [96 %-100 %] 100 % (12/12 0612) Weight:  [108.863 kg (240 lb)] 108.863 kg (240 lb) (12/11 1223)  Intake/Output from previous day: 12/11 0701 - 12/12 0700 In: 840 [P.O.:840] Out: 3725 [Urine:3725] Intake/Output this shift: Total I/O In: -  Out: 3125 [Urine:3125]   Basename 06/30/12 0459 06/29/12 0446  HGB 9.4* 9.8*    Basename 06/30/12 0459 06/29/12 0446  WBC 8.6 8.6  RBC 3.24* 3.31*  HCT 29.9* 30.9*  PLT 117* 162    Basename 06/29/12 0446  NA 134*  K 4.5  CL 97  CO2 29  BUN 28*  CREATININE 1.68*  GLUCOSE 114*  CALCIUM 8.8    Basename 06/30/12 0459 06/29/12 0446  LABPT -- --  INR 1.24 1.13    patient's conscious alert appropriate appears to be very comfortable in no distress. Her left leg dressing was intact it was taken down to the wound her wound was well approximated with Steri-Strip she had no erythema no drainage no pressure blisters her calf and thigh were soft and nontender her leg was neuromotor vascularly intact a new dressing was reapplied she was placed back in a knee immobilizer with ice with foot pumps in place  Assessment/Plan: Postop day #2 status post left total knee arthroplasty doing well Acute postoperative blood loss anemia asymptomatic tolerating it well with a fairly stable H&H A. fib stable on Lovenox bridging therapy restarted Coumadin therapy INR of 1.24 COPD stable no complaints of  shortness of breath Coronary artery disease no complaints of chest pains  Plan continued out of bed with physical therapy per protocol CPM per protocol with limits of 40 flexion max for a minimum of 2 weeks. Appreciate medicines evaluation and treatment of remote multiple medical issues. If does not need telemetry any further we'll transferred to the orthopedic floor today and consider possible discharge to home if continues to improve on Friday  [name] 06/30/2012, 6:30 AM

## 2012-07-01 ENCOUNTER — Encounter (HOSPITAL_COMMUNITY): Payer: Self-pay | Admitting: Specialist

## 2012-07-01 LAB — CBC WITH DIFFERENTIAL/PLATELET
Basophils Absolute: 0 10*3/uL (ref 0.0–0.1)
Basophils Relative: 0 % (ref 0–1)
Eosinophils Absolute: 0.1 10*3/uL (ref 0.0–0.7)
Hemoglobin: 9.4 g/dL — ABNORMAL LOW (ref 12.0–15.0)
MCH: 29.9 pg (ref 26.0–34.0)
MCHC: 33 g/dL (ref 30.0–36.0)
Monocytes Relative: 15 % — ABNORMAL HIGH (ref 3–12)
Neutro Abs: 6.8 10*3/uL (ref 1.7–7.7)
Neutrophils Relative %: 69 % (ref 43–77)
Platelets: 121 10*3/uL — ABNORMAL LOW (ref 150–400)
RDW: 18 % — ABNORMAL HIGH (ref 11.5–15.5)

## 2012-07-01 LAB — COMPREHENSIVE METABOLIC PANEL
AST: 14 U/L (ref 0–37)
Albumin: 3.1 g/dL — ABNORMAL LOW (ref 3.5–5.2)
Alkaline Phosphatase: 112 U/L (ref 39–117)
BUN: 21 mg/dL (ref 6–23)
Potassium: 4.4 mEq/L (ref 3.5–5.1)
Total Protein: 7 g/dL (ref 6.0–8.3)

## 2012-07-01 LAB — PROTIME-INR
INR: 1.31 (ref 0.00–1.49)
Prothrombin Time: 16 seconds — ABNORMAL HIGH (ref 11.6–15.2)

## 2012-07-01 MED ORDER — FERROUS SULFATE 325 (65 FE) MG PO TABS: 325.0000 mg | ORAL_TABLET | Freq: Three times a day (TID) | ORAL | Status: DC

## 2012-07-01 MED ORDER — METHOCARBAMOL 500 MG PO TABS: 500.0000 mg | ORAL_TABLET | Freq: Four times a day (QID) | ORAL | Status: DC | PRN

## 2012-07-01 MED ORDER — OXYCODONE HCL 5 MG PO TABS: 5.0000 mg | ORAL_TABLET | ORAL | Status: DC | PRN

## 2012-07-01 MED ORDER — ENOXAPARIN SODIUM 120 MG/0.8ML ~~LOC~~ SOLN: 100.0000 mg | Freq: Every day | SUBCUTANEOUS | Status: DC

## 2012-07-01 MED ORDER — ENOXAPARIN SODIUM 100 MG/ML ~~LOC~~ SOLN
100.0000 mg | Freq: Once | SUBCUTANEOUS | Status: AC
  Administered 2012-07-01: 100 mg via SUBCUTANEOUS
  Filled 2012-07-01: qty 1

## 2012-07-01 NOTE — Progress Notes (Signed)
Occupational Therapy Treatment Patient Details Name: [name] MRN: [id number] DOB: [birth year] Today's Date: 07/01/2012 Time: 4098-1191 OT Time Calculation (min): 33 min  OT Assessment / Plan / Recommendation Comments on Treatment Session Pt is progressing toward ADL's and goals. She plans to d/c home later today w/ husband assist and HHOT. Pt has A/E & DME per her report. Pt implusivity is limiting factor during functional mobility and transfers, she benefits from VC's for safety with hand placement, proper use of RW and turn completion prior to sitting     Follow Up Recommendations  Home health OT    Barriers to Discharge       Equipment Recommendations  None recommended by OT    Recommendations for Other Services    Frequency Min 2X/week   Plan   Pt plans to D/C home with husband 24hr assist; HHOT; HHPT   Precautions / Restrictions Precautions Precautions: Knee Precaution Comments: KI at all times, no knee flexion >40' Required Braces or Orthoses: Knee Immobilizer - Left Knee Immobilizer - Left: On except when in CPM Restrictions Weight Bearing Restrictions: No LLE Weight Bearing: Weight bearing as tolerated   Pertinent Vitals/Pain Pt reports that LLE "Feels numb from sitting in this chair" No c/o pain    ADL  Eating/Feeding: Performed;Independent Where Assessed - Eating/Feeding: Chair Grooming: Performed;Wash/dry hands;Set up Where Assessed - Grooming: Unsupported sitting Upper Body Bathing: Simulated;Set up;Modified independent Where Assessed - Upper Body Bathing: Unsupported sitting Lower Body Bathing: Simulated;Set up;Minimal assistance;Other (comment);Moderate assistance (caregiver to assist at home) Where Assessed - Lower Body Bathing: Unsupported sitting;Supported sit to stand Upper Body Dressing: Simulated;Set up;Min guard Where Assessed - Upper Body Dressing: Unsupported sitting Lower Body Dressing: Performed;Minimal assistance;Moderate  assistance Where Assessed - Lower Body Dressing: Unsupported sitting;Other (comment) (@ EOB to don/doff socks w/ A/E) Toilet Transfer: Performed;Min guard;Supervision/safety;Other (comment) (VC's on safety with hand placement, proper use of RW and tur) Toilet Transfer Method: Sit to stand;Other (comment) (Amb w/ RW into bathroom to wide 3:1) Acupuncturist: Extra wide bedside commode;Other (comment) (Set up in pt's bathroom) Toileting - Clothing Manipulation and Hygiene: Performed;Min guard;Supervision/safety;Other (comment) (VC's on safety with hand placement, proper use of RW and tur) Tub/Shower Transfer Method: Not assessed Equipment Used: Gait belt;Knee Immobilizer;Reacher;Rolling walker;Sock aid Transfers/Ambulation Related to ADLs: Pt cont to demonstrate implusivity w/ funct mob and amb for ADL's, she benefits from VC's for safety with hand placement, proper use of RW and turn completion prior to sitting due to this ADL Comments: Pt is progressing toward ADL's and goals. She plans to d/c home later today w/ husband assist and HHOT. Pt has A/E & DME per her report. Pt implusivity is limiting factor during functional mobility and transfers, she benefits from VC's for safety with hand placement, proper use of RW and turn completion prior to sitting .    OT Diagnosis:    OT Problem List:   OT Treatment Interventions:     OT Goals ADL Goals ADL Goal: Grooming - Progress: Met ADL Goal: Upper Body Bathing - Progress: Progressing toward goals ADL Goal: Lower Body Bathing - Progress: Progressing toward goals ADL Goal: Upper Body Dressing - Progress: Progressing toward goals ADL Goal: Lower Body Dressing - Progress: Progressing toward goals ADL Goal: Toilet Transfer - Progress: Progressing toward goals ADL Goal: Toileting - Clothing Manipulation - Progress: Met ADL Goal: Toileting - Hygiene - Progress: Met  Visit Information  Last OT Received On: 07/01/12    Subjective Data   Subjective: I'm  going home today Patient Stated Goal: Home w/ husband assist   Prior Functioning       Cognition  Overall Cognitive Status: Appears within functional limits for tasks assessed/performed Arousal/Alertness: Awake/alert Orientation Level: Appears intact for tasks assessed Behavior During Session: [name] for tasks performed    Mobility  Shoulder Instructions Bed Mobility Bed Mobility: Sit to Supine Sit to Supine: 4: Min assist;Other (comment) (LLE) Transfers Transfers: Sit to Stand;Stand to Sit Sit to Stand: 4: Min guard;4: Min assist;From bed;From chair/3-in-1 Stand to Sit: 4: Min guard;4: Min assist;To bed;To chair/3-in-1 Details for Transfer Assistance: Pt continues to require VC's for safety, sequencing and hand placement during sit-stand, and stand-sit secondary to implusivity.              End of Session OT - End of Session Equipment Utilized During Treatment: Gait belt;Left knee immobilizer;Other (comment) (RW, Wide 3:1; LH reacher and sock aid) Activity Tolerance: Patient tolerated treatment well Patient left: in bed;with call bell/phone within reach;with family/visitor present  GO     [name] 07/01/2012, 1:08 PM

## 2012-07-01 NOTE — Discharge Summary (Signed)
Physician Discharge Summary  Patient ID: [name] MRN: [id number] DOB/AGE: Jan 29, 1941 71 y.o.  Admit date: 06/28/2012 Discharge date: 07/01/2012  Admission Diagnoses: End-stage osteoarthritis left knee Obesity They are disease to COPD Renal insufficiency Atrial fib on chronic Coumadin therapy bridged with Lovenox GERD Chronic anemia Fibromyalgia  Discharge Diagnoses:  Active Problems:  Osteoarthritis of left knee  Chronic anticoagulation  Fibromyalgia  Chronic pain  GERD (gastroesophageal reflux disease)  COPD (chronic obstructive pulmonary disease)  Anemia, chronic disease   Discharged Condition: good  Hospital Course: Patient was admitted [facility] under the care of Dr. Valma Cava patient was taken to the OR were a left total knee arthroplasty was performed without any complications patient was taken to the OR and then taken to the recovery room postop follow total knee protocol. Due to her history of atrial fib multiple medical issues a hospitalist consult I recommended a monitored bed. Patient's been 3 days postop course in the hospital which she had not untoward events her initial postop day #1 she had some mild hyponatremia she was asymptomatic. She was transferred to moderate cord of the orthopedic floor to daily with physical therapy and the use of the CPM. Her CPM use was restricted 0-40 due to her soft bone. Her wound was benign for any signs of infection her leg was neuromotor vascularly intact. Her Coumadin was restarted on postop and she was continued to be bridged with the Lovenox. On postop day #3 she was medically stable she was well signs were stable her wound was benign for any signs of infection patient would like to go home so we discharged to home with home health physical therapy CPM 0-40.  Consults: Medical hospitalist was requested for medical management of her multiple medical issues  Significant Diagnostic Studies: Routine  postoperative total knee labs  Treatments: Routine postoperative total knee protocol along with cardiac monitoring due to her multiple medical history for the first postop day  Discharge Exam: Blood pressure 133/74, pulse 97, temperature 99.9 F (37.7 C), temperature source Oral, resp. rate 16, height 5\' 8"  (1.727 m), weight 108.863 kg (240 lb), SpO2 94.00%. Upon discharge patient's conscious alert and appropriate appears to be good historian appears to be in absolutely no distress. Her left knee wound was well approximated with Steri-Strips no signs of infection no drainage drainage no pressure blisters her calf and thigh were soft and nontender her leg was neuromotor vascularly intact  Disposition: 01-Home or Self Care  Discharge Orders    Future Appointments: Provider: Department: Dept Phone: Center:   09/23/2012 10:30 AM Michele Mcalpine, MD [facility] [phone] None     Future Orders Please Complete By Expires   Diet general      Call MD / Call 911      Comments:   If you experience chest pain or shortness of breath, CALL 911 and be transported to the hospital emergency room.  If you develope a fever above 101 F, pus (white drainage) or increased drainage or redness at the wound, or calf pain, call your surgeon's office.   Increase activity slowly as tolerated      Discharge instructions      Comments:   Call [phone] for a followup appointment with Dr. Thomasena Edis is clinic in 2 weeks. Keep the wound clean and dry change dressing on a daily basis. Must use the knee immobilizer while sleeping and while walking. CPM uses 0-40 only no increase in  range of motion until seen  in the office. Please call Dr. Benay Spice office on Monday after get and blunt drawn for recommendations on Coumadin dose.   CPM      Comments:   Continuous passive motion machine (CPM):      Use the CPM from 0 to 40 for 6-8 hours per day. Do not increase range of motion 40 his maximum motion until seen in the  office.   TED hose      Comments:   Use stockings (TED hose) for 3 weeks on both leg(s).  You may remove them at night for sleeping.   Change dressing      Comments:   Change dressing daily with sterile 4 x 4 inch gauze dressing and apply TED hose.  You may clean the incision with alcohol prior to redressing.   Do not put a pillow under the knee. Place it under the heel.          Medication List     As of 07/01/2012  1:53 PM    STOP taking these medications         oxyCODONE-acetaminophen 5-325 MG per tablet   Commonly known as: PERCOCET/ROXICET      TAKE these medications         albuterol (2.5 MG/3ML) 0.083% nebulizer solution   Commonly known as: PROVENTIL   1 vial in neb up to 4 times daily as needed for shortness of breath or wheezing1 vial in neb up to 4 times daily as needed for shortness of breath or wheezing      ALPRAZolam 0.5 MG tablet   Commonly known as: XANAX   TAKE 1 TABLET BY MOUTH 4 TIMES A DAY AS NEEDED FOR NERVES      amitriptyline 50 MG tablet   Commonly known as: ELAVIL   TAKE 2 TABLETS BY MOUTH AT BEDTIME      aspirin EC 81 MG tablet   Take 81 mg by mouth daily.      calcium-vitamin D 500-200 MG-UNIT per tablet   Take 1 tablet by mouth 2 (two) times daily with a meal.      enoxaparin 120 MG/0.8ML injection   Commonly known as: LOVENOX   Inject 0.67 mLs (100 mg total) into the skin daily.      ferrous sulfate 325 (65 FE) MG tablet   Take 1 tablet (325 mg total) by mouth 3 (three) times daily after meals.      furosemide 40 MG tablet   Commonly known as: LASIX   Take 40 mg by mouth 2 (two) times daily.      gabapentin 600 MG tablet   Commonly known as: NEURONTIN   TAKE 1 TABLET FOUR TIMES A DAY      guaiFENesin 600 MG 12 hr tablet   Commonly known as: MUCINEX   Take 600 mg by mouth 2 (two) times daily.      isosorbide mononitrate 30 MG 24 hr tablet   Commonly known as: IMDUR   Take 30 mg by mouth at bedtime.      KLOR-CON M20 20 MEQ  tablet   Generic drug: potassium chloride SA   TAKE 1 TABLET TWICE A DAY      methocarbamol 500 MG tablet   Commonly known as: ROBAXIN   Take 1 tablet (500 mg total) by mouth every 6 (six) hours as needed.      metoprolol tartrate 25 MG tablet   Commonly known as: LOPRESSOR   Take 50 mg by mouth  2 (two) times daily.      multivitamin with minerals Tabs   Take 1 tablet by mouth daily.      omeprazole 20 MG capsule   Commonly known as: PRILOSEC   Take 1 capsule (20 mg total) by mouth 2 (two) times daily.      oxyCODONE 5 MG immediate release tablet   Commonly known as: Oxy IR/ROXICODONE   Take 1-3 tablets (5-15 mg total) by mouth every 3 (three) hours as needed.      promethazine 25 MG tablet   Commonly known as: PHENERGAN   TAKE 1 TABLET BY MOUTH EVERY 6 HOURS AS NEEDED FOR PAIN      simethicone 80 MG chewable tablet   Commonly known as: MYLICON   Chew 80 mg by mouth every 6 (six) hours as needed. Gas      simvastatin 20 MG tablet   Commonly known as: ZOCOR   Take 20 mg by mouth at bedtime.      vitamin B-12 1000 MCG tablet   Commonly known as: CYANOCOBALAMIN   Take 1,000 mcg by mouth daily.      warfarin 5 MG tablet   Commonly known as: COUMADIN   Take 2.5-5 mg by mouth See admin instructions. 5mg  Sunday, Tuesday, Thursday, and Saturday   2.5mg  on monday,wednesday,friday         Signed: Jamelle Rushing 07/01/2012, 1:53 PM

## 2012-07-01 NOTE — Progress Notes (Signed)
Utilization review completed.  

## 2012-07-01 NOTE — Care Management Note (Signed)
    Page 1 of 2   07/01/2012     2:16:35 PM   CARE MANAGEMENT NOTE 07/01/2012  Patient:  [name], [name]   Account Number:  [id number]  Date Initiated:  06/30/2012  Documentation initiated by:  Colleen Can  Subjective/Objective Assessment:   DX: left knee osteoarthritis; total knee replacemnt ;     Action/Plan:   CM spoke with patient and spouse. Plans are for patient to return to her home in [facility] where spouse will be caregiver. Wants HH agency that is in network.   Anticipated DC Date:  07/01/2012   Anticipated DC Plan:  HOME W HOME HEALTH SERVICES  In-house referral  Clinical Social Worker      DC Planning Services  CM consult      PAC Choice  DURABLE MEDICAL EQUIPMENT  HOME HEALTH   Choice offered to / List presented to:  C-1 Patient   DME arranged  [facility]  CPM      DME agency  [facility]  [employer]     HH arranged  HH-1 RN  HH-2 PT  HH-3 OT      [facility] agency  [facility]   Status of service:  Completed, signed off Medicare Important Message given?  NA - LOS <3 / Initial given by admissions (If response is "NO", the following Medicare IM given date fields will be blank) Date Medicare IM given:   Date Additional Medicare IM given:    Discharge Disposition:  HOME W HOME HEALTH SERVICES  Per UR Regulation:    If discussed at Long Length of Stay Meetings, dates discussed:    Comments:  07/01/2012 Damaris Schooner RN CCM [phone] [facility] will provide [facility] for services for blood drws along with reinforcemnt teaching of lovenox administration. Spouse has administered lovenox to patient in the past. CPM ordered from [employer] and will deliver to patient's home. fAXED cpm orders to [facility] at [phone]. Faxed HH orders, face sheet. op note  and H&P to [facility] [phone] with confirmation. [facility] will start [facility] services 07/02/2012.Contact information given to pt's spouse for [facility].

## 2012-07-01 NOTE — Progress Notes (Signed)
Subjective: Patient feels like she doing well this morning she has less soreness in the knee she has no other complaints is eager to go home   Objective: Vital signs in last 24 hours: Temp:  [97.6 F (36.4 C)-99.9 F (37.7 C)] 99.9 F (37.7 C) (12/13 0451) Pulse Rate:  [88-107] 97  (12/13 0515) Resp:  [16-18] 16  (12/13 0451) BP: (106-133)/(66-79) 133/74 mmHg (12/13 0451) SpO2:  [85 %-97 %] 93 % (12/13 0515)  Intake/Output from previous day: 12/12 0701 - 12/13 0700 In: 480 [P.O.:480] Out: 3700 [Urine:3700] Intake/Output this shift:     Basename 07/01/12 0432 06/30/12 0459 06/29/12 0446  HGB 9.4* 9.4* 9.8*    Basename 07/01/12 0432 06/30/12 0459  WBC 9.9 8.6  RBC 3.14* 3.24*  HCT 28.5* 29.9*  PLT 121* 117*    Basename 07/01/12 0432 06/29/12 0446  NA 132* 134*  K 4.4 4.5  CL 93* 97  CO2 30 29  BUN 21 28*  CREATININE 1.43* 1.68*  GLUCOSE 128* 114*  CALCIUM 9.2 8.8    Basename 07/01/12 0432 06/30/12 0459  LABPT -- --  INR 1.31 1.24    Patient is conscious alert appropriate appears to be good historian appears to be in absolutely no distress laying comfortably in bed. Her left knee full approximated with Steri-Strips no signs of infection. There is no pressure blisters calf and thigh are soft and nontender her foot neuromotor vascularly intact  Assessment/Plan: Postop day #3 status post left total knee arthroplasty doing very well Osteoporotic bone Mild hyponatremia asymptomatic A. fib on chronic Coumadin therapy bridging with Lovenox COPD stable Renal insufficiency stable Coronary artery disease asymptomatic  Plan out of bed with physical therapy this morning and discharged home with home health physical therapy CPM followup with Dr. Thomasena Edis in 2 weeks. Discussion with pharmacist this morning about bridging therapy and Coumadin therapy post discharge recommendations are Lovenox 100 mg once a day for 5 days and the patient is to go back on her preoperative  Coumadin dosing therapy routine and we will recheck INR on Monday. INR results will be faxed to Dr. Benay Spice office for management   Jamelle Rushing 07/01/2012, 7:20 AM

## 2012-07-01 NOTE — Progress Notes (Signed)
Physical Therapy Treatment Patient Details Name: [name] MRN: [id number] DOB: 21-Jan-1941 Today's Date: 07/01/2012 Time: 1021-1100 PT Time Calculation (min): 39 min  PT Assessment / Plan / Recommendation Comments on Treatment Session  POD # 3 pt plans to D/C to home today.  Pt more oriented  but still impulsive.  Amb in hallway then performed TKR TE's.  Pt/spouse instructed on KI use all times except when in CPM or performing TE's.  Instructed on knee flex no more that 40'. Instructed on use of ICE and frequency.  Given handout TKR TE's with limitation of 40 degree knee flex.  Pt needs a new RW but wants to know if INS will cover the cost first.    Follow Up Recommendations  Home health PT     Does the patient have the potential to tolerate intense rehabilitation     Barriers to Discharge        Equipment Recommendations  Rolling walker with 5" wheels    Recommendations for Other Services    Frequency 7X/week   Plan Discharge plan remains appropriate    Precautions / Restrictions Precautions Precautions: Knee Precaution Comments: KI at all times, no knee flexion >40'  Required Braces or Orthoses: Knee Immobilizer - Left Knee Immobilizer - Left: On except when in CPM Restrictions Weight Bearing Restrictions: No LLE Weight Bearing: Weight bearing as tolerated   Pertinent Vitals/Pain C/o 4/10 during TE's ICE applied    Mobility  Bed Mobility Bed Mobility: Not assessed Sit to Supine: 4: Min assist;Other (comment) (LLE) Details for Bed Mobility Assistance: Pt OOB in recliner Transfers Transfers: Sit to Stand;Stand to Sit Sit to Stand: 5: Supervision;4: Min guard;From chair/3-in-1 Stand to Sit: 4: Min guard;5: Supervision Details for Transfer Assistance: Pt requires 25% VC's on safety as she cont to be impulsive and tries to sit before completing turns.  Spouse instructed on safe handling techniques. Ambulation/Gait Ambulation/Gait Assistance: 4: Min  guard Ambulation Distance (Feet): 96 Feet Assistive device: Rolling walker Ambulation/Gait Assistance Details: 50% VC's to decrease gait speed and proper walker to self distance. Pt impulsive, chppy gait.  Instructed on safety. Gait Pattern: Step-to pattern;Decreased stance time - left;Trunk flexed Stairs: No (Pt has a ramp)    Exercises Total Joint Exercises Ankle Circles/Pumps: AROM;Both;10 reps;Supine Quad Sets: AROM;Both;10 reps;Supine Gluteal Sets: AROM;10 reps;Both;Supine Towel Squeeze: AROM;Both;10 reps;Supine Heel Slides: AAROM;Left;10 reps;Supine (up to 40 degrees only) Hip ABduction/ADduction: AAROM;Left;10 reps;Supine Straight Leg Raises: AAROM;Left;10 reps;Supine   PT Goals                                            progressing    Visit Information  Last PT Received On: 07/01/12 Assistance Needed: +1    Subjective Data  Subjective: I'm going home today Patient Stated Goal: home   Cognition  Overall Cognitive Status: Appears within functional limits for tasks assessed/performed Arousal/Alertness: Awake/alert Orientation Level: Appears intact for tasks assessed Behavior During Session: [facility] for tasks performed    Balance   fair with RW  End of Session PT - End of Session Equipment Utilized During Treatment: Gait belt;Left knee immobilizer Activity Tolerance: Patient tolerated treatment well Patient left: with call bell/phone within reach;with family/visitor present (ICE to L knee)   Felecia Shelling  PTA [facility]  Acute  Rehab Pager     [phone]

## 2012-07-02 LAB — TYPE AND SCREEN
ABO/RH(D): O NEG
Donor AG Type: NEGATIVE
Donor AG Type: NEGATIVE
Unit division: 0
Unit division: 0

## 2012-07-04 ENCOUNTER — Other Ambulatory Visit: Payer: Self-pay | Admitting: Pain Medicine

## 2012-07-15 ENCOUNTER — Other Ambulatory Visit: Payer: Self-pay | Admitting: Pulmonary Disease

## 2012-08-04 ENCOUNTER — Telehealth: Payer: Self-pay | Admitting: Pulmonary Disease

## 2012-08-05 NOTE — Telephone Encounter (Signed)
Called and spoke with pts husband and he is aware of handicap placard that has been left up front to be picked up.  They will be by on Monday for this. Nothing further is needed.

## 2012-08-22 ENCOUNTER — Telehealth: Payer: Self-pay | Admitting: Pulmonary Disease

## 2012-08-22 MED ORDER — HYDROCODONE-HOMATROPINE 5-1.5 MG/5ML PO SYRP: ORAL_SOLUTION | ORAL | Status: DC

## 2012-08-22 MED ORDER — CEPHALEXIN 500 MG PO CAPS: 500.0000 mg | ORAL_CAPSULE | Freq: Three times a day (TID) | ORAL | Status: DC

## 2012-08-22 NOTE — Telephone Encounter (Signed)
Per SN-change to keflex 500mg  take one tablet three times daily #21. Rx sent. Pt is aware. Carron Curie, CMA

## 2012-08-22 NOTE — Telephone Encounter (Signed)
Lasts seen 05/24/12.  Pt c/o increased chest congestion and cough with green mucus, chest tightness, wheezing, sob and headaches x 3 days. The pt denies any fever or sore throat. Pt requesting rx be sent to her pharmacy. SN,pls advise.No Known Allergies

## 2012-08-22 NOTE — Telephone Encounter (Signed)
Per SN----ok to call in avelox 400 mg  #7  1 daily , mucinex 600 mg   2 po bid with plenty of fluids, hydromet  #6oz  1 tsp every 6 hours as needed for cough.  thanks

## 2012-08-22 NOTE — Telephone Encounter (Signed)
Called, spoke with pt.  Informed her of below per Dr. Kriste Basque.  She verbalized understanding of this and aware rxs sent to [facility] [address]  Hydromet called into pharm.  Dr. Kriste Basque, when sending avelox, received override msg on avelox and coumadin d/t hypoprothrombinemic effects.  Are you still ok with send avelox?  I do not see avelox on pt's past med list.  Pls advise.  Thank you.

## 2012-08-22 NOTE — Telephone Encounter (Signed)
Pls advise of interaction between Coumadin and Avelox. Also, pt says if the Avelox is going to be expensive, she would like a cheaper alternative called to her pharmacy instead. Pls advise.

## 2012-08-25 ENCOUNTER — Encounter: Payer: Self-pay | Admitting: Pharmacist Clinician (PhC)/ Clinical Pharmacy Specialist

## 2012-09-02 ENCOUNTER — Other Ambulatory Visit: Payer: Self-pay | Admitting: Pulmonary Disease

## 2012-09-23 ENCOUNTER — Ambulatory Visit: Payer: Medicare Other | Admitting: Pulmonary Disease

## 2012-10-07 ENCOUNTER — Ambulatory Visit: Payer: Self-pay | Admitting: Cardiovascular Disease

## 2012-10-07 DIAGNOSIS — Z7901 Long term (current) use of anticoagulants: Secondary | ICD-10-CM

## 2012-10-07 DIAGNOSIS — I4891 Unspecified atrial fibrillation: Secondary | ICD-10-CM

## 2012-10-10 ENCOUNTER — Telehealth: Payer: Self-pay | Admitting: Pulmonary Disease

## 2012-10-10 DIAGNOSIS — E538 Deficiency of other specified B group vitamins: Secondary | ICD-10-CM

## 2012-10-10 DIAGNOSIS — Z7901 Long term (current) use of anticoagulants: Secondary | ICD-10-CM

## 2012-10-10 DIAGNOSIS — D638 Anemia in other chronic diseases classified elsewhere: Secondary | ICD-10-CM

## 2012-10-10 DIAGNOSIS — I4891 Unspecified atrial fibrillation: Secondary | ICD-10-CM

## 2012-10-10 DIAGNOSIS — K219 Gastro-esophageal reflux disease without esophagitis: Secondary | ICD-10-CM

## 2012-10-10 DIAGNOSIS — F411 Generalized anxiety disorder: Secondary | ICD-10-CM

## 2012-10-10 DIAGNOSIS — G894 Chronic pain syndrome: Secondary | ICD-10-CM

## 2012-10-10 DIAGNOSIS — E785 Hyperlipidemia, unspecified: Secondary | ICD-10-CM

## 2012-10-10 NOTE — Telephone Encounter (Signed)
Per SN----  Ok to come in for fasting labs. These have been placed for the pt.  Pt is aware and nothing further is needed.

## 2012-10-10 NOTE — Telephone Encounter (Signed)
I spoke with pt and she c/o dizzy spells off and on x 3-4 days at a time. Then it will go away for a week then come back. Per pt she has meclizine 25 mg for dizziness 1 po Q6HRS prn. Per pt this helps sometimes only. She states she will take one now but last time this happened she needed a pint of blood. She is wanting to know if she may need labs to check on her blood.  Please advise SN thanks Last OV 05/24/12

## 2012-10-12 ENCOUNTER — Other Ambulatory Visit (INDEPENDENT_AMBULATORY_CARE_PROVIDER_SITE_OTHER): Payer: Medicare Other

## 2012-10-12 DIAGNOSIS — F411 Generalized anxiety disorder: Secondary | ICD-10-CM

## 2012-10-12 DIAGNOSIS — I4891 Unspecified atrial fibrillation: Secondary | ICD-10-CM

## 2012-10-12 DIAGNOSIS — E538 Deficiency of other specified B group vitamins: Secondary | ICD-10-CM

## 2012-10-12 DIAGNOSIS — E785 Hyperlipidemia, unspecified: Secondary | ICD-10-CM

## 2012-10-12 LAB — CBC WITH DIFFERENTIAL/PLATELET
Basophils Relative: 0.9 % (ref 0.0–3.0)
Eosinophils Relative: 5.8 % — ABNORMAL HIGH (ref 0.0–5.0)
HCT: 34.6 % — ABNORMAL LOW (ref 36.0–46.0)
Hemoglobin: 11.4 g/dL — ABNORMAL LOW (ref 12.0–15.0)
Lymphs Abs: 1.8 10*3/uL (ref 0.7–4.0)
Monocytes Relative: 8.4 % (ref 3.0–12.0)
Neutro Abs: 2.2 10*3/uL (ref 1.4–7.7)
Platelets: 118 10*3/uL — ABNORMAL LOW (ref 150.0–400.0)
RBC: 3.81 Mil/uL — ABNORMAL LOW (ref 3.87–5.11)
WBC: 4.8 10*3/uL (ref 4.5–10.5)

## 2012-10-12 LAB — IBC PANEL: Iron: 62 ug/dL (ref 42–145)

## 2012-10-12 LAB — BASIC METABOLIC PANEL
BUN: 22 mg/dL (ref 6–23)
Creatinine, Ser: 1.7 mg/dL — ABNORMAL HIGH (ref 0.4–1.2)
GFR: 31.71 mL/min — ABNORMAL LOW (ref >60.00)

## 2012-10-12 LAB — LIPID PANEL
Cholesterol: 81 mg/dL (ref 0–200)
LDL Cholesterol: 30 mg/dL (ref 0–99)
Total CHOL/HDL Ratio: 2
Triglycerides: 85 mg/dL (ref 0.0–149.0)
VLDL: 17 mg/dL (ref 0.0–40.0)

## 2012-10-12 LAB — TSH: TSH: 2.88 u[IU]/mL (ref 0.35–5.50)

## 2012-10-12 LAB — VITAMIN B12: Vitamin B-12: 1250 pg/mL — ABNORMAL HIGH (ref 211–911)

## 2012-10-12 LAB — HEPATIC FUNCTION PANEL: Total Bilirubin: 0.7 mg/dL (ref 0.3–1.2)

## 2012-10-17 ENCOUNTER — Telehealth: Payer: Self-pay | Admitting: Pulmonary Disease

## 2012-10-17 NOTE — Telephone Encounter (Signed)
[facility] pt at home number line rang several times, NA, no voicemail. Also tried cell number, NA,  no option to leave message. Carron Curie, CMA

## 2012-10-18 ENCOUNTER — Encounter (HOSPITAL_COMMUNITY): Payer: Self-pay | Admitting: *Deleted

## 2012-10-18 ENCOUNTER — Emergency Department (HOSPITAL_COMMUNITY): Payer: Medicare Other

## 2012-10-18 ENCOUNTER — Emergency Department (HOSPITAL_COMMUNITY)
Admission: EM | Admit: 2012-10-18 | Discharge: 2012-10-18 | Disposition: A | Discharge status: Home / Self Care | Payer: Medicare Other | Attending: Emergency Medicine | Admitting: Emergency Medicine

## 2012-10-18 DIAGNOSIS — Z951 Presence of aortocoronary bypass graft: Secondary | ICD-10-CM | POA: Insufficient documentation

## 2012-10-18 DIAGNOSIS — Z8701 Personal history of pneumonia (recurrent): Secondary | ICD-10-CM | POA: Insufficient documentation

## 2012-10-18 DIAGNOSIS — Z79899 Other long term (current) drug therapy: Secondary | ICD-10-CM | POA: Insufficient documentation

## 2012-10-18 DIAGNOSIS — Z7982 Long term (current) use of aspirin: Secondary | ICD-10-CM | POA: Insufficient documentation

## 2012-10-18 DIAGNOSIS — Z8709 Personal history of other diseases of the respiratory system: Secondary | ICD-10-CM | POA: Insufficient documentation

## 2012-10-18 DIAGNOSIS — R55 Syncope and collapse: Secondary | ICD-10-CM | POA: Insufficient documentation

## 2012-10-18 DIAGNOSIS — Z8679 Personal history of other diseases of the circulatory system: Secondary | ICD-10-CM | POA: Insufficient documentation

## 2012-10-18 DIAGNOSIS — Z8659 Personal history of other mental and behavioral disorders: Secondary | ICD-10-CM | POA: Insufficient documentation

## 2012-10-18 DIAGNOSIS — J449 Chronic obstructive pulmonary disease, unspecified: Secondary | ICD-10-CM | POA: Insufficient documentation

## 2012-10-18 DIAGNOSIS — Z8619 Personal history of other infectious and parasitic diseases: Secondary | ICD-10-CM | POA: Insufficient documentation

## 2012-10-18 DIAGNOSIS — Z8719 Personal history of other diseases of the digestive system: Secondary | ICD-10-CM | POA: Insufficient documentation

## 2012-10-18 DIAGNOSIS — I2510 Atherosclerotic heart disease of native coronary artery without angina pectoris: Secondary | ICD-10-CM | POA: Insufficient documentation

## 2012-10-18 DIAGNOSIS — N390 Urinary tract infection, site not specified: Secondary | ICD-10-CM

## 2012-10-18 DIAGNOSIS — K219 Gastro-esophageal reflux disease without esophagitis: Secondary | ICD-10-CM | POA: Insufficient documentation

## 2012-10-18 DIAGNOSIS — Z8739 Personal history of other diseases of the musculoskeletal system and connective tissue: Secondary | ICD-10-CM | POA: Insufficient documentation

## 2012-10-18 DIAGNOSIS — Z87891 Personal history of nicotine dependence: Secondary | ICD-10-CM | POA: Insufficient documentation

## 2012-10-18 DIAGNOSIS — G894 Chronic pain syndrome: Secondary | ICD-10-CM | POA: Insufficient documentation

## 2012-10-18 DIAGNOSIS — Z87448 Personal history of other diseases of urinary system: Secondary | ICD-10-CM | POA: Insufficient documentation

## 2012-10-18 DIAGNOSIS — J4489 Other specified chronic obstructive pulmonary disease: Secondary | ICD-10-CM | POA: Insufficient documentation

## 2012-10-18 DIAGNOSIS — M129 Arthropathy, unspecified: Secondary | ICD-10-CM | POA: Insufficient documentation

## 2012-10-18 DIAGNOSIS — Z7901 Long term (current) use of anticoagulants: Secondary | ICD-10-CM | POA: Insufficient documentation

## 2012-10-18 DIAGNOSIS — E785 Hyperlipidemia, unspecified: Secondary | ICD-10-CM | POA: Insufficient documentation

## 2012-10-18 DIAGNOSIS — F411 Generalized anxiety disorder: Secondary | ICD-10-CM | POA: Insufficient documentation

## 2012-10-18 LAB — URINE MICROSCOPIC-ADD ON

## 2012-10-18 LAB — CBC
HCT: 32.9 % — ABNORMAL LOW (ref 36.0–46.0)
RDW: 17.6 % — ABNORMAL HIGH (ref 11.5–15.5)
WBC: 4.9 10*3/uL (ref 4.0–10.5)

## 2012-10-18 LAB — COMPREHENSIVE METABOLIC PANEL
AST: 19 U/L (ref 0–37)
Albumin: 3.7 g/dL (ref 3.5–5.2)
Alkaline Phosphatase: 126 U/L — ABNORMAL HIGH (ref 39–117)
BUN: 21 mg/dL (ref 6–23)
Chloride: 100 mEq/L (ref 96–112)
Potassium: 4.3 mEq/L (ref 3.5–5.1)
Total Bilirubin: 0.5 mg/dL (ref 0.3–1.2)

## 2012-10-18 LAB — URINALYSIS, ROUTINE W REFLEX MICROSCOPIC
Bilirubin Urine: NEGATIVE
Glucose, UA: NEGATIVE mg/dL
Hgb urine dipstick: NEGATIVE
Specific Gravity, Urine: 1.012 (ref 1.005–1.030)
pH: 6 (ref 5.0–8.0)

## 2012-10-18 LAB — PROTIME-INR: Prothrombin Time: 27.3 seconds — ABNORMAL HIGH (ref 11.6–15.2)

## 2012-10-18 MED ORDER — SODIUM CHLORIDE 0.9 % IV BOLUS (SEPSIS)
1000.0000 mL | Freq: Once | INTRAVENOUS | Status: AC
  Administered 2012-10-18: 1000 mL via INTRAVENOUS

## 2012-10-18 MED ORDER — DEXTROSE 5 % IV SOLN
1.0000 g | Freq: Once | INTRAVENOUS | Status: AC
  Administered 2012-10-18: 1 g via INTRAVENOUS
  Filled 2012-10-18: qty 10

## 2012-10-18 MED ORDER — CEPHALEXIN 500 MG PO CAPS: 500.0000 mg | ORAL_CAPSULE | Freq: Four times a day (QID) | ORAL | Status: DC

## 2012-10-18 NOTE — Telephone Encounter (Addendum)
Called spoke with patient Still having dizzy spells Passed out at home Saturday, didn't get completely out of bed before fainting Having headaches and sharp pains in temple Does report that she remembers having some blurred vision perhaps this past weekend ? Discussed with TP Advised patient to go to ER for immediate evaluation Pt okay with these recs and verbalized her understanding Will go to [facility] ER

## 2012-10-18 NOTE — ED Provider Notes (Signed)
History     CSN: [id number]  Arrival date & time 10/18/12  1801   First MD Initiated Contact with Patient 10/18/12 1852      Chief Complaint  Patient presents with  . Loss of Consciousness    (Consider location/radiation/quality/duration/timing/severity/associated sxs/prior treatment) HPI Pt presenting with c/o syncopal episodes.  She has been having these intermittently for months.  She was hospitalized severl months ago and was found to be anemic requring transfusion.  Her primary reason for visit today is that her PMD advised her to come in due to having hit her head and right arm pain after fall associated with syncope several days ago.  She does take coumadin.  No vomiting, no seizure activity.  Has pain in right temple associated with fall.  Pt denies having chest pain or sudden headache or palpitations prior to syncopal events.  She feels generally tired today.  No dizziness upon standing.  There are no other associated systemic symptoms, there are no other alleviating or modifying factors.  Past Medical History  Diagnosis Date  . Coronary atherosclerosis of unspecified type of vessel, native or graft   . Atrial fibrillation 01/19/2012    stress test - non-gated secondary to A fib,  normal myocaridal perfusion study  . Unspecified venous (peripheral) insufficiency   . Other and unspecified hyperlipidemia   . Morbid obesity   . Esophageal reflux   . Irritable bowel syndrome   . Other symptoms involving urinary system   . Chronic pain syndrome   . 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 Anxiety state, unspecified   . Herpes zoster without mention of complication   . Herpes zoster with other nervous system complications   . Lumbago   . Anticoagulated on Coumadin, chronic for A. Fib 04/08/2012  . COPD (chronic obstructive pulmonary disease)   . 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 Pneumonia 4 years ago  . Bronchitis, chronic   .  Fibromyalgia   . Arthritis   . CAD (coronary artery disease) 04/29/2006    echo -EF 45-50%, impaired LV relaxation  . 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 GI bleed 04/07/2012    endoscopy/colonoscopy unremarkable  . 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 DJD (degenerative joint disease)     Past Surgical History  Procedure Laterality Date  . Vesicovaginal fistula closure w/ tah    . Cardiac catheterization  01/06/2010    RCA mid artery stenosis at 99%, LAD proximal occlusion 90%  at origin of ramus  . Nissen fundoplication    . Esophagogastroduodenoscopy  04/09/2012    Procedure: ESOPHAGOGASTRODUODENOSCOPY (EGD);  Surgeon: Theda Belfast, MD;  Location: [facility] ENDOSCOPY;  Service: Endoscopy;  Laterality: N/A;  tentatively scheduled per Blase Mess due to patients breathing problems  . Colonoscopy  04/11/2012    Procedure: COLONOSCOPY;  Surgeon: Meryl Dare, MD,FACG;  Location: [facility] ENDOSCOPY;  Service: Endoscopy;  Laterality: N/A;  . Abdominal hysterectomy  at 72 years old  . Appendectomy  about 50 years ago  . Coronary artery bypass graft  1997    LIMA to LAD, SVG to ramus, SVG to RCA  . Cholecystectomy  50 years ago  . Neck surgery  72 years old    full movement  . Right knee arthroscopy  3 years ago  . Eye surgery  2005    cataracts both eyes  . Back surgery  x5, "birdcage" and fused in lower back  . Total knee arthroplasty  06/28/2012    Procedure: TOTAL KNEE ARTHROPLASTY;  Surgeon: Eugenia Mcalpine, MD;  Location: [facility] ORS;  Service: Orthopedics;  Laterality: Left;  . Joint replacement Left 06/28/12    knee replacement    Family History  Problem Relation Age of Onset  . Heart disease Mother   . Lung disease Father   . Kidney disease      History  Substance Use Topics  . Smoking status: Former Smoker -- 1.00 packs/day for 22 years    Types: Cigarettes    Quit date: 07/20/1984  . Smokeless tobacco: Never Used  . Alcohol  Use: No    OB History   Grav Para Term Preterm Abortions TAB SAB Ect Mult Living                  Review of Systems ROS reviewed and all otherwise negative except for mentioned in HPI  Allergies  Review of patient's allergies indicates no known allergies.  Home Medications   Current Outpatient Rx  Name  Route  Sig  Dispense  Refill  . albuterol (PROVENTIL) (2.5 MG/3ML) 0.083% nebulizer solution   Nebulization   Take 2.5 mg by nebulization 3 (three) times daily as needed for wheezing or shortness of breath.         . ALPRAZolam (XANAX) 0.5 MG tablet   Oral   Take 0.5 mg by mouth 4 (four) times daily.         Marland Kitchen amitriptyline (ELAVIL) 50 MG tablet   Oral   Take 50 mg by mouth at bedtime.         Marland Kitchen aspirin EC 81 MG tablet   Oral   Take 81 mg by mouth every evening.          . Calcium Carbonate-Vitamin D (CALCIUM-VITAMIN D) 500-200 MG-UNIT per tablet   Oral   Take 1 tablet by mouth 2 (two) times daily with a meal.         . cyclobenzaprine (FLEXERIL) 10 MG tablet   Oral   Take 10 mg by mouth 2 (two) times daily as needed for muscle spasms.         Marland Kitchen docusate sodium (COLACE) 100 MG capsule   Oral   Take 200 mg by mouth at bedtime.         . ferrous sulfate 325 (65 FE) MG tablet   Oral   Take 325 mg by mouth every evening.         . furosemide (LASIX) 40 MG tablet   Oral   Take 40 mg by mouth 2 (two) times daily.          Marland Kitchen gabapentin (NEURONTIN) 600 MG tablet   Oral   Take 600 mg by mouth 4 (four) times daily.         . Guaifenesin 1200 MG TB12   Oral   Take 1,200 mg by mouth 2 (two) times daily.         . isosorbide mononitrate (IMDUR) 30 MG 24 hr tablet   Oral   Take 30 mg by mouth at bedtime.          . metoprolol (LOPRESSOR) 50 MG tablet   Oral   Take 50 mg by mouth 2 (two) times daily.         . Multiple Vitamin (MULTIVITAMIN WITH MINERALS) TABS   Oral   Take 1 tablet by mouth daily.         Marland Kitchen  omeprazole (PRILOSEC)  20 MG capsule   Oral   Take 20 mg by mouth 2 (two) times daily.         Marland Kitchen oxyCODONE-acetaminophen (PERCOCET/ROXICET) 5-325 MG per tablet   Oral   Take 2 tablets by mouth every 4 (four) hours as needed for pain.         . potassium chloride SA (K-DUR,KLOR-CON) 20 MEQ tablet   Oral   Take 20 mEq by mouth 2 (two) times daily.         . promethazine (PHENERGAN) 25 MG tablet   Oral   Take 25 mg by mouth 2 (two) times daily as needed for nausea.         . Simethicone (GAS-X PO)   Oral   Take 1 tablet by mouth daily as needed (gas).         . simvastatin (ZOCOR) 20 MG tablet   Oral   Take 20 mg by mouth at bedtime.          Marland Kitchen warfarin (COUMADIN) 5 MG tablet   Oral   Take 2.5-5 mg by mouth daily. 5mg  Sunday, Tuesday, Thursday, and Saturday   2.5mg  on monday,wednesday,friday         . cephALEXin (KEFLEX) 500 MG capsule   Oral   Take 1 capsule (500 mg total) by mouth 4 (four) times daily.   28 capsule   0   . vitamin B-12 (CYANOCOBALAMIN) 1000 MCG tablet   Oral   Take 1,000 mcg by mouth daily.            BP 110/54  Pulse 79  Temp(Src) 97.9 F (36.6 C) (Oral)  Resp 23  Ht 5\' 8"  (1.727 m)  Wt 245 lb (111.131 kg)  BMI 37.26 kg/m2  SpO2 100% Vitals reviewed Physical Exam Physical Examination: General appearance - alert, well appearing, and in no distress Mental status - alert, oriented to person, place, and time Eyes - pupils equal and reactive, extraocular eye movements intact Mouth - mucous membranes moist, pharynx normal without lesions Chest - clear to auscultation, no wheezes, rales or rhonchi, symmetric air entry Heart - normal rate, regular rhythm, normal S1, S2, no murmurs, rubs, clicks or gallops Abdomen - soft, nontender, nondistended, no masses or organomegaly Extremities - peripheral pulses normal, no pedal edema, no clubbing or cyanosis Neuro- alert and oriented x 3, cranial nerves grossly intact, strength and sensation intact in extremities x  4 Skin - normal coloration and turgor, no rashes  ED Course  Procedures (including critical care time)   Date: 10/19/2012  Rate: 86  Rhythm: atrial fibrillation  QRS Axis: normal  Intervals: indeterminate  ST/T Wave abnormalities: nonspecific T wave changes  Conduction Disutrbances:abberancy  Narrative Interpretation:   Old EKG Reviewed: unchanged   Labs Reviewed  CBC - Abnormal; Notable for the following:    RBC 3.62 (*)    Hemoglobin 10.8 (*)    HCT 32.9 (*)    RDW 17.6 (*)    Platelets 103 (*)    All other components within normal limits  COMPREHENSIVE METABOLIC PANEL - Abnormal; Notable for the following:    Glucose, Bld 101 (*)    Creatinine, Ser 1.66 (*)    Alkaline Phosphatase 126 (*)    GFR calc non Af Amer 30 (*)    GFR calc Af Amer 35 (*)    All other components within normal limits  PROTIME-INR - Abnormal; Notable for the following:    Prothrombin Time 27.3 (*)  INR 2.69 (*)    All other components within normal limits  URINALYSIS, ROUTINE W REFLEX MICROSCOPIC - Abnormal; Notable for the following:    APPearance CLOUDY (*)    Leukocytes, UA MODERATE (*)    All other components within normal limits  URINE MICROSCOPIC-ADD ON - Abnormal; Notable for the following:    Casts HYALINE CASTS (*)    All other components within normal limits  URINE CULTURE   Dg Elbow Complete Right  10/18/2012  *RADIOLOGY REPORT*  Clinical Data: Fall, elbow pain/bruising  RIGHT ELBOW - COMPLETE 3+ VIEW  Comparison: None.  Findings: No fracture or dislocation is seen.  The joint spaces are preserved.  The visualized soft tissues are unremarkable.  No displaced elbow joint fat pads to suggest an elbow joint effusion.  IMPRESSION: No fracture or dislocation is seen.   Original Report Authenticated By: Charline Bills, M.D.    Dg Wrist Complete Right  10/18/2012  *RADIOLOGY REPORT*  Clinical Data: Fall, right wrist pain  RIGHT WRIST - COMPLETE 3+ VIEW  Comparison: None.  Findings: No  fracture or dislocation is seen.  The joint spaces are preserved.  The visualized soft tissues are unremarkable.  IMPRESSION: No fracture or dislocation is seen.   Original Report Authenticated By: Charline Bills, M.D.    Ct Head Wo Contrast (only If Suspected Head Trauma And/or Pt Is On Anticoagulant)  10/18/2012  *RADIOLOGY REPORT*  Clinical Data: Syncopal episodes  CT HEAD WITHOUT CONTRAST  Technique:  Contiguous axial images were obtained from the base of the skull through the vertex without contrast.  Comparison: 10/28/2004  Findings:  Grossly unchanged mild diffuse atrophy with prominence of the bifrontal extra-axial spaces and centralized volume loss with commiserate ex vacuo dilatation of the ventricular system. Scattered minimal periventricular hypodensities compatible microvascular ischemic disease.  Given background parenchymal abnormalities, there is no CT evidence of acute large territory infarct.  No intraparenchymal or extra-axial mass or hemorrhage. Normal size and configuration of the ventricles and basilar cisterns.  No midline shift.  Limited visualization of the paranasal sinuses and mastoid air cells are normal.  Regional soft tissues are normal.  No displaced calvarial fracture.  Post left- sided cataract surgery.  IMPRESSION:  1.  No acute intracranial process. 2.  Stable findings of atrophy and mild microvascular ischemic disease.   Original Report Authenticated By: Tacey Ruiz, MD      1. UTI 2. Syncope 3. Fall 4. Minor head injury 5. Arm pain    MDM  Pt presenting with c/o headache and right arm pain after syncopal event several days ago.  She has had multiple syncopal events over the past several months- has been seeing her doctor for these.  Head CT and xrays reassuring.  Pt received IV hydration and found to have UTI on UA- she feels much improved on recheck and would much prefer to be released rather than hospitalized.  I have discussed these options with her.  She has  an appointment tomorrow morning with her doctor and would prefer to be discharged and attend that appointment.  Pt started on rocephin in the ED for UTI and will be dishcarged with keflex rx.  Discharged with strict return precautions.  Pt agreeable with plan.        Ethelda Chick, MD 10/19/12 1535

## 2012-10-18 NOTE — ED Notes (Signed)
Pt with syncopal episodes since September.  Pt will be out for several seconds.  She had another syncopal episode on Sat and called her pcp today b/c she had hurt her R arm.  Pcp told her to come here.  Denies headache though pt has been c/o intermittent R temple pain.  Denies chest pain.

## 2012-10-18 NOTE — Telephone Encounter (Signed)
Spoke with pt and she reports having a fainting episode with fall on Saturday.  C/o soreness in right wrist and ribs.  Still having dizzy spells with sharp pains in temple area occas.  Denies blurred vision.  PT given appt with TP 10-19-12 at 10:15

## 2012-10-19 ENCOUNTER — Ambulatory Visit: Payer: Medicare Other | Admitting: Adult Health

## 2012-10-19 ENCOUNTER — Telehealth: Payer: Self-pay | Admitting: Adult Health

## 2012-10-19 NOTE — Telephone Encounter (Signed)
Pt called with update on ED visit 10/18/12 .  Arm xray showed no fracture.  Head CT performed and was "okay".  Positive for UTI and treated with abx.  Pt has appt with [name] on 10-24-12 and will discuss ED visit at this time.  Pt instructed to call our office if symptoms worsen before then.

## 2012-10-20 LAB — URINE CULTURE: Culture: NO GROWTH

## 2012-10-24 ENCOUNTER — Ambulatory Visit (INDEPENDENT_AMBULATORY_CARE_PROVIDER_SITE_OTHER): Payer: Medicare Other | Admitting: Pulmonary Disease

## 2012-10-24 ENCOUNTER — Encounter: Payer: Self-pay | Admitting: Pulmonary Disease

## 2012-10-24 VITALS — BP 134/70 | HR 93 | Temp 98.5°F | Ht 68.0 in | Wt 256.0 lb

## 2012-10-24 DIAGNOSIS — N3289 Other specified disorders of bladder: Secondary | ICD-10-CM

## 2012-10-24 DIAGNOSIS — D638 Anemia in other chronic diseases classified elsewhere: Secondary | ICD-10-CM

## 2012-10-24 DIAGNOSIS — J449 Chronic obstructive pulmonary disease, unspecified: Secondary | ICD-10-CM

## 2012-10-24 DIAGNOSIS — I872 Venous insufficiency (chronic) (peripheral): Secondary | ICD-10-CM

## 2012-10-24 DIAGNOSIS — I2510 Atherosclerotic heart disease of native coronary artery without angina pectoris: Secondary | ICD-10-CM

## 2012-10-24 DIAGNOSIS — T148XXA Other injury of unspecified body region, initial encounter: Secondary | ICD-10-CM

## 2012-10-24 DIAGNOSIS — K219 Gastro-esophageal reflux disease without esophagitis: Secondary | ICD-10-CM

## 2012-10-24 DIAGNOSIS — E785 Hyperlipidemia, unspecified: Secondary | ICD-10-CM

## 2012-10-24 DIAGNOSIS — M545 Low back pain, unspecified: Secondary | ICD-10-CM

## 2012-10-24 DIAGNOSIS — J4489 Other specified chronic obstructive pulmonary disease: Secondary | ICD-10-CM

## 2012-10-24 DIAGNOSIS — M797 Fibromyalgia: Secondary | ICD-10-CM

## 2012-10-24 DIAGNOSIS — F411 Generalized anxiety disorder: Secondary | ICD-10-CM

## 2012-10-24 DIAGNOSIS — I4891 Unspecified atrial fibrillation: Secondary | ICD-10-CM

## 2012-10-24 DIAGNOSIS — Z7901 Long term (current) use of anticoagulants: Secondary | ICD-10-CM

## 2012-10-24 DIAGNOSIS — K589 Irritable bowel syndrome without diarrhea: Secondary | ICD-10-CM

## 2012-10-24 DIAGNOSIS — M1990 Unspecified osteoarthritis, unspecified site: Secondary | ICD-10-CM

## 2012-10-24 DIAGNOSIS — N259 Disorder resulting from impaired renal tubular function, unspecified: Secondary | ICD-10-CM

## 2012-10-24 DIAGNOSIS — R55 Syncope and collapse: Secondary | ICD-10-CM

## 2012-10-24 MED ORDER — ONDANSETRON HCL 4 MG PO TABS: 4.0000 mg | ORAL_TABLET | Freq: Three times a day (TID) | ORAL | Status: DC | PRN

## 2012-10-24 MED ORDER — TIZANIDINE HCL 4 MG PO TABS: 4.0000 mg | ORAL_TABLET | Freq: Three times a day (TID) | ORAL | Status: DC | PRN

## 2012-10-24 NOTE — Progress Notes (Signed)
Subjective:    Patient ID: [name], female    DOB: [birth year], 72 y.o.   MRN: [id number]  HPI 72 y/o WF here for a follow up visit... she has ASHD w/ prev CABG and chr AFib on Coumadin followed by DrBerry... she also has FM/ chr pain syndrome followed by [name]... we have followed her primarily for COPD/ Asthmatic Bronchitis- see below...  ~  December 29, 2010:  531mo ROV & c/o recurrent URI symptoms w/ cough, incr SOB, sore left lat ribs, green sputum, congestion, etc; treated w/ Avelox x2, then Cipro & no better she says; we discussed how the color comes from the cellular elements in the sput & not germs per se; she is also c/o tired all the time, fatigue, sick all the time, etc; we decided to wait for fever, ch in sputum & then culture it...    She saw DrBerry for [facility] 1/12> stable from the Cards standpoint & no changes made... Unfortunately her wt is up to 271# & she reminded me that she had Bariatric surg y[place] ([place] the 70's) & lost >100# but grad put it all back on... We checked her labs & everything is about the same x B12 is borderline at 279 & we will rec OTC Vit B12 supplement po qd...  ~  July 28, 2011:  631mo ROV & she has persistent AB symptoms despite Augmentin & Pred Rx called in> she knows that she must improve diet, exercise & get wt down... Also c/o aching all over, soreness, tender, HAs, etc c/w her FM & mult somatic complaints...  She saw DrBerry 10/12 for f/u CAD, s/p CABG 1997, AFib on Coumadin & rate control, Hyperlipidemia> she had false pos Myoview in 2009 & subseq cath that showed patent grafts & norm LVF; he felt she was stable & rec same meds, f/u 79yr... See prob list>>   ~  December 22, 2011:  31mo ROV & [name] is c/o aches & pains from her FM; she saw [name] for Ortho & had shots in her knee but told ?she was too high risk for surg? We discussed this & I offered her second opinion consults w/ Ortho specialists at [facility]) or [name]) if she  wants...     She saw [name] for [facility] 2/13> chr AFib, on Coumadin & ASA81mg /d, plus Metop25Bid/ ImDur30/ Lasix40Bid;  He added in the LANOXIN 0.25mg /d to help her ventric response==> she did not bring her med bottles or an updated list of her meds, it is unclear if she is still taking the Lanoxin per [facility]...      We reviewed prob list, meds, xrays and labs> see below>> LABS 6/13:  FLP- at goals on Simva20;  Chems- ok w/ Creat=1.7;  CBC- ok;  TSH=1.46;  BNP=179; A1c=5.9  ~  May 24, 2012:  31mo ROV & [name] was [place] by [facility] 9/19 - 04/13/12 for weakness, dizzy, syncope in the setting of hypotension & acute anemia w/ GIbleed while on Coumadin> he specific numbers were BP=90/40, Hg=7.3, stools pos for blood, INR=2.19;  She received 2u blood, FFP, VitK;  She had EGD= neg (intact Nissen) & Colonoscopy= neg, no bleeding source found;  Coumadin was help then restarted;  Only med change was incr Metoprolol 25mg  tabs- take 3tabs Bid...  Since disch she saw TP 10/13> doing satis & Hg= 11.3;  She had f/u [facility] 10/13> stable chr AFib w/ controlled VR, same meds, no changes made...    COPD>  on AlbutNEBS, Symbicort160, Mucinex; breating is stable & chest clear at present, RR=18, O2sat=94% on RA...    Cards> CAD, s/p CABG, chr AFib> on Imdur, Metoprolol, Lasix, KCl, Coumadin; [facility] & [facility] notes are reviewed...    Chol> on Simva20; last FLP at goals- continue same, get on diet, get wt down...    Obesity> she has gone 255 ==> 236 ==> 245 today; we reviewed diet, exercise, wt reduction strategies...    Renal Insuffic> prev labs in the 1.5 to 1.7 range, sl better during her Hosp reading 1.2 to 1.3, but now back to 1.6=>1.8 on her same outpt meds...    Rheum> DJD, Compression T4 & L2, chr pain syndrome, FM> she is followed by [name], DrTooke, [name]; on Percocet Tid Prn & Neurontin 600mg Bid;  Also takes Amitriptyline 50mg - 2hs & Flexeril 10 Tid Prn...    Anxiety> on Alprazolam 0.5mg  Tid as  needed... We reviewed prob list, meds, xrays and labs> see below for updates >> she had Flu vaccine 9/13... LABS 11/13:  Chems-  Ok x mild renal insuffic w/ BUN=25, Creat=1.8;  CBC- mild anemia w/ Hg=10.7, Fe=88 (21%)...  ~  October 24, 2012:  59mo ROV & [name] was [facility] 12/13 for Left TKR by Hayden Rasmussen & she did well w/ Lovenox bridging & no medical complications...  She went to the ER 4/14 w/ syncopal spell at home when she got up to go to the bathroom hitting her head & right arm;  ER eval was neg XRays were neg, and CTHead showed mild diffuse atrophy & vol loss w/ microvasc ischemic dis, no acute intracranial process;  Labs revealed Hg=10.8, Cr=1.66, Protime=27.3/ INR=2.69; Urine cloudy w/ WBCs (given Rocephin=>Keflex)...  We discussed checking MRI Brain and CDopplers;  She also needs f/u w/ Cards to r/o cardiac arrhythmia etc...     We reviewed prob list, meds, xrays and labs> see below for updates >> she reports that her insurance co requires change of Flexeril10 to Zanaflex4mg , and Phenergan25 to Zofran4mg ... LABS 3-4/14:  FLP- looks good on Simva20;  Chems- ok x Creat=1.66;  CBC- Hg~11, MCV=91, & Fe=62 (17%);  TSH=2.88... CTHead 4/14 showed mild diffuse atrophy & vol loss w/ microvasc ischemic dis, no acute intracranial process... MRI Brain & CDopplers have been ordered & pending... Cardiac f/u appt w/ DrBerry...           Problem List:  COPD (ICD-496) - ex smoker, quit 81yrs... uses nebulizer w/ ALBUTEROL Qid (using 1-2 daily) vs Proventil HFA, SYMBICORT 160- 2spBid & MUCINEX 1-2 Bid w/ fluids... baseline CXR shows prev CABG surg, borderline Cor, NAD... prev PFT's 12/05 showed more restriction (from effort and obesity) than obstruction... ~  CXR 10/09 in hosp showed biapical pleural thickening, NAD. ~  5/10:  bronchitic exas treated w/ Levaquin/ Medrol/ etc... 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 ~  12/10:  another exac requiring Levaquin/ Pred... we will add SYMBICORT160- 2sp Bid. ~   Improved w/ addition of Symbicort, but still c/o occas exac requiring antibiotics etc... ~  CXR 6/11 showed mild cardiomeg, s/p CABG, calcif Ao, clear/ NAD.Marland Kitchen. ~  CXR 1-2/13 showed borderline heart size, s/p CABG, lungs clear w/ min peribronch thickening, DJD spine, osteopenia... ~  PFT 6/13 showed FVC=2.01 (59%), FEV1=1.62 (64%), %1sec=81, mid-flows=81%pred==> more restricted than obstructed; aske to contue meds & LOSE WEIGHT! ~  CXR 9/13 showed normal heart size, s/p CABG, sl pulm vasc congestion, NAD.Marland Kitchen. ~  CXR 12/13 showed borderline heart size, post  op bypass changes, chr bronchitic interstitial lung changes w/o acute dis...  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  on IMDUR 30mg /d, METOPROLOL 25mg -3Bid, LASIX 40mg Bid, and K20Bid... ~  NuclearStressTests in 8/05 showing infer scar and mild inferolat ischemia...  ~  2DEcho 10/07 showed EF=45-50% and DD...  ~  repeat Myoview in 9/08 showed no ischemia... ~  hosp 10/09 w/ CP- cath showed 90%proxLAD, normCIRC, subtotal midRCA, LIMA & 2 vein grafts patent;  LVF= 60% w/o regional wall motion abn... no change from 2004. ~  repeat cath 6/11 showed no change from 2009 and patent grafts... ~  Labs 6/13 look good & BNP= 179... ~  [facility] 9/13 w/ anemia & GI bleed > labs reviewed; DrKelly incr her Metoprolol to 75mg  Bid... ~  [facility] 12/13 by DrACollins for left TKR...  ATRIAL FIBRILLATION (ICD-427.31) - on COUMADIN per DrBerry... ~  4/11: complic after epidural shot w/ large ecchymosis right lumbar area, PT INR was >6, managed by [facility]. ~  9/13:  Hosp by [facility] w/ anemia (Hg=7.3), pos stools, INR=2.19; Coumadin held transiently, Tx 2u +FFP & VitK; EGD & Colon neg; Coumadin restarted; DrKelly increased her Metoprolol to 75mg Bid...  VENOUS INSUFFICIENCY (ICD-459.81) - she follows low sodium diet. elevates legs, wears support hose occas... she is on LASIX 40mg Bid per Cards... ~  9/12: [name] ordered  VenDopplers- neg for DVT...  HYPERLIPIDEMIA (ICD-272.4) - on ZOCOR 20mg /d... ~  FLP 2/09 showed TChol 112, TG 76, HDL 43, LDL 54 ~  FLP 3/10 showed TChol 166, TG 137, HDL 40, LDL 98 ~  FLP 10/10 showed TChol 120, Tg 91, HDL 47, LDL 55 ~  12/11 & 9/56: not fasting for today's OV's... ~  4/12: note from [facility] showed TChol 99, TG 68, HDL 43, LDL 42 on Simva20. ~  FLP 2/13 on Simva20 showed TChol 110, TG 98, HDL 47, LDL 43 ~  FLP on 6/13 Simva20 showed TChol 104, TG 96, HDL 50, LDL 35 ~  FLP 3/14 on Simva20 showed TChol 81, TG 85, HDL 34, LDL 30  MORBID OBESITY (ICD-278.01) - we have discussed diet and exercise program on numerous occas in the past and again today! ~  weight 2/10 = 250# ~  weight 7/10 = 262# ~  weight 8/10 = 245# ~  weight 12/10 = 266# "it's a fighting battle" ~  weight 4/11 = 262# ~  weight 8/11 = 256# ~  weight 12/11 = 263#... we reviewed diet + exercise thereapy. ~  Weight 6/12 = 271# ~  Weight 1/13 = 266# ~  Weight 6/13 = 255# ~  Weight 11/13 = 245# ~  Weight 4/14 = 256#  GERD (ICD-530.81) - on OMEPRAZOLE 20mg Bid... ?[facility] changed to Aciphex? ~  EGD 9/13 by DrHung showed intact Nissen fundoplication, normal esoph, gastric mucosa, and duod  IBS (ICD-564.1) - colonoscopy was 9/07 by Dorris Singh and showed hems only... 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 ~  Colonoscopy 9/13 by DrStark was normal- no divertics, AVMs, polyps, or other lesions...  RENAL INSUFFICIENCY (ICD-588.9) - Creat in the 1.6-1.7 range... ~  labs 2/09 showed BUN= 16, Creat= 1.6 ~  labs 3/10 showed BUN= 20, Creat= 1.7 ~  labs 4/11 showed BUN= 15, Creat= 1.6 ~  labs in hosp 6/11 showed BUN= 9, Creat= 1.3 ~  Labs here 6/12 showed BUN= 21, Creat= 1.6 ~  Labs 1/13 on Lasix40Bid showed BUN=15, Creat= 1.7 ~  Labs 6/13 on Lasix40Bid showed  BUN= 21, Creat= 1.7 ~  Labs 9/13 in hosp showed Creat= 1.2-1.3 ~  Labs 11/13 on Lasix40Bid showed BUN= 25, Creat= 1.8 ~  Labs 3/14 on  Lasix40Bid+K20Bid showed K=5.1, BUN=22, Cr=1.7  OTHER SYMPTOMS INVOLVING URINARY SYSTEM (ICD-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 she responded to Toviaz 8mg  for overact bladder symptoms==> no longer on her med list...  DEGENERATIVE JOINT DISEASE >> LOW BACK PAIN, CHRONIC - eval and treated by Tamala Julian, now DrBeekman> "they do it all now" w/ rechecks Q3-64months... she takes OXYCODONE 5/325, NEURONTIN 600mg Bid, etc... COMPRESSION FRACTURE - found to have compression T12 & L2 4/11 treated by NS- DrTooke... CHRONIC PAIN SYNDROME - managed by Eligha Bridegroom... FIBROMYALGIA - on AMITRIPTYLINE 50mg - 2Qhs & FLEXERIL 10mg  Tid... PAGET'S DISEASE - on Observation per [facility], Rheumatology... ~  labs 2/09 showed AlkPhos= 79 ~  labs 3/10 showed AlkPhos= 86 ~  labs 4/11 showed AlkPhos= 70 ~  labs 6/11 in hosp showed AlkPhos = 71 ~  Bone Scan 6/11 showed stable Paget's distal left tibia, compress fx L2, OA of both knees. ~  Labs 6/13 showed AlkPhos= 86, remains wnl... ~  Labs 3/14 showed AlkPhos= 108  SYNCOPE >> Adm 4/14 by [facility]- assoc w/ postural change by history & UTI via ER... ~  4/14:  CT Head in ER showed mild diffuse atrophy & vol loss w/ microvasc ischemic dis, no acute intracranial process... ~  4/14:  No postural BP changes in office today; she has been treated for UTI found in ER last week; we will order MRI & CDopplers; she will f/u w/ Cards...  ANXIETY - on ALPRAZOLAM 0.5mg Qid prn...  Hx of HERPES ZOSTER (ICD-053.9), & POSTHERPETIC NEURALGIA (ICD-053.19)  ANEMIA >>  ~  Labs 11/13 showed Hg=10.7, MCV=92, Fe=88 (21%sat)... ~  Labs 3/14 showed Hg=11.4, MCV=91, Fe=62 (17%sat)...   Past Surgical History  Procedure Laterality Date  . Vesicovaginal fistula closure w/ tah    . Cardiac catheterization  01/06/2010    RCA mid artery stenosis at 99%, LAD proximal occlusion 90%  at origin of ramus  . Nissen fundoplication    .  Esophagogastroduodenoscopy  04/09/2012    Procedure: ESOPHAGOGASTRODUODENOSCOPY (EGD);  Surgeon: Theda Belfast, MD;  Location: [facility] ENDOSCOPY;  Service: Endoscopy;  Laterality: N/A;  tentatively scheduled per Blase Mess due to patients breathing problems  . Colonoscopy  04/11/2012    Procedure: COLONOSCOPY;  Surgeon: Meryl Dare, MD,FACG;  Location: [facility] ENDOSCOPY;  Service: Endoscopy;  Laterality: N/A;  . Abdominal hysterectomy  at 72 years old  . Appendectomy  about 50 years ago  . Coronary artery bypass graft  1997    LIMA to LAD, SVG to ramus, SVG to RCA  . Cholecystectomy  50 years ago  . Neck surgery  72 years old    full movement  . Right knee arthroscopy  3 years ago  . Eye surgery  2005    cataracts both eyes  . Back surgery      x5, "birdcage" and fused in lower back  . Total knee arthroplasty  06/28/2012    Procedure: TOTAL KNEE ARTHROPLASTY;  Surgeon: Eugenia Mcalpine, MD;  Location: [facility] ORS;  Service: Orthopedics;  Laterality: Left;  . Joint replacement Left 06/28/12    knee replacement    Outpatient Encounter Prescriptions as of 10/24/2012  Medication Sig Dispense Refill  . albuterol (PROVENTIL) (2.5 MG/3ML) 0.083% nebulizer solution Take 2.5 mg by nebulization 3 (three) times daily as  needed for wheezing or shortness of breath.      . ALPRAZolam (XANAX) 0.5 MG tablet Take 0.5 mg by mouth 4 (four) times daily.      Marland Kitchen amitriptyline (ELAVIL) 50 MG tablet Take 50 mg by mouth at bedtime.      Marland Kitchen aspirin EC 81 MG tablet Take 81 mg by mouth every evening.       . Calcium Carbonate-Vitamin D (CALCIUM-VITAMIN D) 500-200 MG-UNIT per tablet Take 1 tablet by mouth 2 (two) times daily with a meal.      . cephALEXin (KEFLEX) 500 MG capsule Take 1 capsule (500 mg total) by mouth 4 (four) times daily.  28 capsule  0  . cyclobenzaprine (FLEXERIL) 10 MG tablet Take 10 mg by mouth 2 (two) times daily as needed for muscle spasms.      Marland Kitchen docusate sodium (COLACE) 100 MG capsule Take 200 mg by mouth  at bedtime.      . ferrous sulfate 325 (65 FE) MG tablet Take 325 mg by mouth every evening.      . furosemide (LASIX) 40 MG tablet Take 40 mg by mouth 2 (two) times daily.       Marland Kitchen gabapentin (NEURONTIN) 600 MG tablet Take 600 mg by mouth 4 (four) times daily.      . Guaifenesin 1200 MG TB12 Take 1,200 mg by mouth 2 (two) times daily.      . isosorbide mononitrate (IMDUR) 30 MG 24 hr tablet Take 30 mg by mouth at bedtime.       . metoprolol (LOPRESSOR) 50 MG tablet Take 50 mg by mouth 2 (two) times daily.      . Multiple Vitamin (MULTIVITAMIN WITH MINERALS) TABS Take 1 tablet by mouth daily.      Marland Kitchen omeprazole (PRILOSEC) 20 MG capsule Take 20 mg by mouth 2 (two) times daily.      Marland Kitchen oxyCODONE-acetaminophen (PERCOCET/ROXICET) 5-325 MG per tablet Take 2 tablets by mouth every 4 (four) hours as needed for pain.      . potassium chloride SA (K-DUR,KLOR-CON) 20 MEQ tablet Take 20 mEq by mouth 2 (two) times daily.      . promethazine (PHENERGAN) 25 MG tablet Take 25 mg by mouth 2 (two) times daily as needed for nausea.      . Simethicone (GAS-X PO) Take 1 tablet by mouth daily as needed (gas).      . simvastatin (ZOCOR) 20 MG tablet Take 20 mg by mouth at bedtime.       . vitamin B-12 (CYANOCOBALAMIN) 1000 MCG tablet Take 1,000 mcg by mouth daily.       Marland Kitchen warfarin (COUMADIN) 5 MG tablet Take 2.5-5 mg by mouth daily. 5mg  Sunday, Tuesday, Thursday, and Saturday   2.5mg  on monday,wednesday,friday       No facility-administered encounter medications on file as of 10/24/2012.    No Known Allergies  Current Medications, Allergies, Past Medical History, Past Surgical History, Family History, and Social History were reviewed in [employer] record.    Review of Systems         See HPI - all other systems neg except as noted... The patient complains of decreased hearing, dyspnea on exertion, headaches, incontinence, and depression.  The patient denies anorexia, fever, weight loss,  weight gain, vision loss, hoarseness, chest pain, syncope, peripheral edema, prolonged cough, hemoptysis, abdominal pain, melena, hematochezia, severe indigestion/heartburn, hematuria, muscle weakness, suspicious skin lesions, transient blindness, difficulty walking, unusual weight change, abnormal bleeding, enlarged  lymph nodes, and angioedema.    Objective:   Physical Exam     WD, Obese, 72 y/o WF in NAD... GENERAL:  Alert & oriented; pleasant & cooperative... HEENT:  [place]/AT, EOM- full, PERRLA, EACs-clear, TMs-wnl, NOSE-clear, THROAT-clear & wnl. NECK:  Supple w/ fairROM; no JVD; normal carotid impulses w/o bruits; no thyromegaly or nodules palpated; no lymphadenopathy. CHEST:  Coarse BS w/ no wheezing, no rales, no signs of consolidation... HEART:  Regular Rhythm; without murmurs/ rubs/ or gallops heard... ABDOMEN:  Obese, soft & nontender; normal bowel sounds; no organomegaly or masses detected. EXT: without deformities, mild arthritic changes; no varicose veins/ +venous insuffic/ 1+ edema. NEURO:  no focal neuro deficits... DERM:  along right lateral chest wall, under breast & over the costal margin- scarring in skin from shingles rash...  RADIOLOGY DATA:  Reviewed in the EPIC EMR & discussed w/ the patient...  LABORATORY DATA:  Reviewed in the EPIC EMR & discussed w/ the patient...   Assessment & Plan:    COPD>  Stable overall despite her chronic symptoms> continue NEBS, Symbicort, Mucinex, Fluids;  I told her no more antibiotics w/o approp culture data...  ASHD/ CAD/ s/p CABG>  Followed by Marland Mcalpine [facility]; continue same meds; needs cardiac w/u for poss causes of Syncope...  AFIB>  On Coumadin per [facility], continue same Rx...  HYPERLIPIDEMIA>  FLP looks great, continue Simva20...  OBESITY>  We reviewed diet & exercise program; she must be more successful at losing weight!!!  GI Bleed>  [facility] 9/13 w/ Anemia & GI bleed; neg EGD & Colon, no source found...  Renal Insuffic>  Creat= 1.66  on her current regimen; advised to drink plenty of water & maintain hydration...  ORTHO>  LBP/ Compression fx/ FM/ Paget's/ Chr Pain Syndrome> managed by [name] at [facility] he took over for [facility]; we discussed checking w/ DrBeekman before considering Ortho surg...  Anxiety>  On Alpraz prn...  Anemia>  She was Tx 2u in hosp 9/13; not currently on Fe therapy; Labs showed Hg= 10.7.Marland KitchenMarland Kitchen   Patient's Medications  New Prescriptions   ONDANSETRON (ZOFRAN) 4 MG TABLET    Take 1 tablet (4 mg total) by mouth 3 (three) times daily as needed for nausea.   TIZANIDINE (ZANAFLEX) 4 MG TABLET    Take 1 tablet (4 mg total) by mouth 3 (three) times daily as needed.  Previous Medications   ALBUTEROL (PROVENTIL) (2.5 MG/3ML) 0.083% NEBULIZER SOLUTION    Take 2.5 mg by nebulization 3 (three) times daily as needed for wheezing or shortness of breath.   ALPRAZOLAM (XANAX) 0.5 MG TABLET    Take 0.5 mg by mouth 4 (four) times daily.   AMITRIPTYLINE (ELAVIL) 50 MG TABLET    Take 50 mg by mouth at bedtime.   ASPIRIN EC 81 MG TABLET    Take 81 mg by mouth every evening.    CALCIUM CARBONATE-VITAMIN D (CALCIUM-VITAMIN D) 500-200 MG-UNIT PER TABLET    Take 1 tablet by mouth 2 (two) times daily with a meal.   CEPHALEXIN (KEFLEX) 500 MG CAPSULE    Take 1 capsule (500 mg total) by mouth 4 (four) times daily.   DOCUSATE SODIUM (COLACE) 100 MG CAPSULE    Take 200 mg by mouth at bedtime.   FERROUS SULFATE 325 (65 FE) MG TABLET    Take 325 mg by mouth every evening.   FUROSEMIDE (LASIX) 40 MG TABLET    Take 40 mg by mouth 2 (two) times daily.    GABAPENTIN (NEURONTIN)  600 MG TABLET    Take 600 mg by mouth 4 (four) times daily.   GUAIFENESIN 1200 MG TB12    Take 1,200 mg by mouth 2 (two) times daily.   ISOSORBIDE MONONITRATE (IMDUR) 30 MG 24 HR TABLET    Take 30 mg by mouth at bedtime.    METOPROLOL (LOPRESSOR) 50 MG TABLET    Take 50 mg by mouth 2 (two) times daily.   MULTIPLE VITAMIN (MULTIVITAMIN WITH MINERALS) TABS     Take 1 tablet by mouth daily.   OMEPRAZOLE (PRILOSEC) 20 MG CAPSULE    Take 20 mg by mouth 2 (two) times daily.   OXYCODONE-ACETAMINOPHEN (PERCOCET/ROXICET) 5-325 MG PER TABLET    Take 2 tablets by mouth every 4 (four) hours as needed for pain.   POTASSIUM CHLORIDE SA (K-DUR,KLOR-CON) 20 MEQ TABLET    Take 20 mEq by mouth 2 (two) times daily.   SIMETHICONE (GAS-X PO)    Take 1 tablet by mouth daily as needed (gas).   SIMVASTATIN (ZOCOR) 20 MG TABLET    Take 20 mg by mouth at bedtime.    VITAMIN B-12 (CYANOCOBALAMIN) 1000 MCG TABLET    Take 1,000 mcg by mouth daily.    WARFARIN (COUMADIN) 5 MG TABLET    Take 2.5-5 mg by mouth daily. 5mg  Sunday, Tuesday, Thursday, and Saturday   2.5mg  on monday,wednesday,friday  Modified Medications   No medications on file  Discontinued Medications   CYCLOBENZAPRINE (FLEXERIL) 10 MG TABLET    Take 10 mg by mouth 2 (two) times daily as needed for muscle spasms.   PROMETHAZINE (PHENERGAN) 25 MG TABLET    Take 25 mg by mouth 2 (two) times daily as needed for nausea.

## 2012-10-24 NOTE — Patient Instructions (Addendum)
Today we updated your med list in our EPIC system...    Continue your current medications the same...    We changed the Flexeril to Tizanidine & the Phenergan to Zofran PER YOUR INSURANCE COMPANY...  We will try to arrange for an MRI of your Brain & for Carotid dopplers to further evaluate your Syncope...     You need a follow up appt w/ your cardiologist as well...  Call for any questions...  Let's plan a follow up visit in 86mo, sooner if needed for problems.Marland KitchenMarland Kitchen

## 2012-10-25 ENCOUNTER — Encounter (HOSPITAL_COMMUNITY): Payer: Self-pay | Admitting: Emergency Medicine

## 2012-10-25 ENCOUNTER — Emergency Department (HOSPITAL_COMMUNITY): Payer: Medicare Other

## 2012-10-25 ENCOUNTER — Inpatient Hospital Stay (HOSPITAL_COMMUNITY)
Admission: EM | Admit: 2012-10-25 | Discharge: 2012-10-28 | DRG: 312 | Disposition: A | Discharge status: Home / Self Care | Payer: Medicare Other | Attending: Internal Medicine | Admitting: Internal Medicine

## 2012-10-25 DIAGNOSIS — Z87891 Personal history of nicotine dependence: Secondary | ICD-10-CM

## 2012-10-25 DIAGNOSIS — I129 Hypertensive chronic kidney disease with stage 1 through stage 4 chronic kidney disease, or unspecified chronic kidney disease: Secondary | ICD-10-CM | POA: Diagnosis present

## 2012-10-25 DIAGNOSIS — S9031XA Contusion of right foot, initial encounter: Secondary | ICD-10-CM

## 2012-10-25 DIAGNOSIS — Z79899 Other long term (current) drug therapy: Secondary | ICD-10-CM

## 2012-10-25 DIAGNOSIS — F411 Generalized anxiety disorder: Secondary | ICD-10-CM

## 2012-10-25 DIAGNOSIS — E785 Hyperlipidemia, unspecified: Secondary | ICD-10-CM | POA: Diagnosis present

## 2012-10-25 DIAGNOSIS — M889 Osteitis deformans of unspecified bone: Secondary | ICD-10-CM

## 2012-10-25 DIAGNOSIS — H6091 Unspecified otitis externa, right ear: Secondary | ICD-10-CM

## 2012-10-25 DIAGNOSIS — B0229 Other postherpetic nervous system involvement: Secondary | ICD-10-CM

## 2012-10-25 DIAGNOSIS — M797 Fibromyalgia: Secondary | ICD-10-CM

## 2012-10-25 DIAGNOSIS — N3289 Other specified disorders of bladder: Secondary | ICD-10-CM

## 2012-10-25 DIAGNOSIS — J449 Chronic obstructive pulmonary disease, unspecified: Secondary | ICD-10-CM | POA: Diagnosis present

## 2012-10-25 DIAGNOSIS — M1710 Unilateral primary osteoarthritis, unspecified knee: Secondary | ICD-10-CM | POA: Diagnosis present

## 2012-10-25 DIAGNOSIS — J4489 Other specified chronic obstructive pulmonary disease: Secondary | ICD-10-CM | POA: Diagnosis present

## 2012-10-25 DIAGNOSIS — G894 Chronic pain syndrome: Secondary | ICD-10-CM

## 2012-10-25 DIAGNOSIS — I4821 Permanent atrial fibrillation: Secondary | ICD-10-CM | POA: Diagnosis present

## 2012-10-25 DIAGNOSIS — M1990 Unspecified osteoarthritis, unspecified site: Secondary | ICD-10-CM | POA: Diagnosis present

## 2012-10-25 DIAGNOSIS — R195 Other fecal abnormalities: Secondary | ICD-10-CM

## 2012-10-25 DIAGNOSIS — M545 Low back pain, unspecified: Secondary | ICD-10-CM | POA: Diagnosis present

## 2012-10-25 DIAGNOSIS — M1712 Unilateral primary osteoarthritis, left knee: Secondary | ICD-10-CM

## 2012-10-25 DIAGNOSIS — T148XXA Other injury of unspecified body region, initial encounter: Secondary | ICD-10-CM

## 2012-10-25 DIAGNOSIS — Z7901 Long term (current) use of anticoagulants: Secondary | ICD-10-CM

## 2012-10-25 DIAGNOSIS — R55 Syncope and collapse: Principal | ICD-10-CM | POA: Diagnosis present

## 2012-10-25 DIAGNOSIS — I2510 Atherosclerotic heart disease of native coronary artery without angina pectoris: Secondary | ICD-10-CM | POA: Diagnosis present

## 2012-10-25 DIAGNOSIS — Z6837 Body mass index (BMI) 37.0-37.9, adult: Secondary | ICD-10-CM

## 2012-10-25 DIAGNOSIS — Z96659 Presence of unspecified artificial knee joint: Secondary | ICD-10-CM

## 2012-10-25 DIAGNOSIS — N183 Chronic kidney disease, stage 3 unspecified: Secondary | ICD-10-CM | POA: Diagnosis present

## 2012-10-25 DIAGNOSIS — Z951 Presence of aortocoronary bypass graft: Secondary | ICD-10-CM

## 2012-10-25 DIAGNOSIS — I4891 Unspecified atrial fibrillation: Secondary | ICD-10-CM | POA: Diagnosis present

## 2012-10-25 DIAGNOSIS — K219 Gastro-esophageal reflux disease without esophagitis: Secondary | ICD-10-CM

## 2012-10-25 DIAGNOSIS — D638 Anemia in other chronic diseases classified elsewhere: Secondary | ICD-10-CM | POA: Diagnosis present

## 2012-10-25 DIAGNOSIS — K589 Irritable bowel syndrome without diarrhea: Secondary | ICD-10-CM

## 2012-10-25 DIAGNOSIS — K922 Gastrointestinal hemorrhage, unspecified: Secondary | ICD-10-CM

## 2012-10-25 DIAGNOSIS — J209 Acute bronchitis, unspecified: Secondary | ICD-10-CM

## 2012-10-25 DIAGNOSIS — I872 Venous insufficiency (chronic) (peripheral): Secondary | ICD-10-CM

## 2012-10-25 DIAGNOSIS — D689 Coagulation defect, unspecified: Secondary | ICD-10-CM

## 2012-10-25 DIAGNOSIS — Z7982 Long term (current) use of aspirin: Secondary | ICD-10-CM

## 2012-10-25 DIAGNOSIS — N259 Disorder resulting from impaired renal tubular function, unspecified: Secondary | ICD-10-CM

## 2012-10-25 DIAGNOSIS — R3989 Other symptoms and signs involving the genitourinary system: Secondary | ICD-10-CM

## 2012-10-25 DIAGNOSIS — B029 Zoster without complications: Secondary | ICD-10-CM

## 2012-10-25 DIAGNOSIS — D62 Acute posthemorrhagic anemia: Secondary | ICD-10-CM

## 2012-10-25 LAB — COMPREHENSIVE METABOLIC PANEL
ALT: 9 U/L (ref 0–35)
AST: 22 U/L (ref 0–37)
Albumin: 3.9 g/dL (ref 3.5–5.2)
Alkaline Phosphatase: 123 U/L — ABNORMAL HIGH (ref 39–117)
BUN: 27 mg/dL — ABNORMAL HIGH (ref 6–23)
CO2: 32 mEq/L (ref 19–32)
Calcium: 9.2 mg/dL (ref 8.4–10.5)
Chloride: 96 mEq/L (ref 96–112)
Creatinine, Ser: 1.56 mg/dL — ABNORMAL HIGH (ref 0.50–1.10)
GFR calc Af Amer: 37 mL/min — ABNORMAL LOW (ref >90)
GFR calc non Af Amer: 32 mL/min — ABNORMAL LOW (ref >90)
Glucose, Bld: 107 mg/dL — ABNORMAL HIGH (ref 70–99)
Potassium: 4.4 mEq/L (ref 3.5–5.1)
Sodium: 136 mEq/L (ref 135–145)
Total Bilirubin: 0.5 mg/dL (ref 0.3–1.2)
Total Protein: 7.8 g/dL (ref 6.0–8.3)

## 2012-10-25 LAB — CBC WITH DIFFERENTIAL/PLATELET
Basophils Absolute: 0.1 10*3/uL (ref 0.0–0.1)
Basophils Relative: 2 % — ABNORMAL HIGH (ref 0–1)
Eosinophils Absolute: 0.3 10*3/uL (ref 0.0–0.7)
Eosinophils Relative: 7 % — ABNORMAL HIGH (ref 0–5)
HCT: 32.8 % — ABNORMAL LOW (ref 36.0–46.0)
Hemoglobin: 10.5 g/dL — ABNORMAL LOW (ref 12.0–15.0)
Lymphocytes Relative: 29 % (ref 12–46)
Lymphs Abs: 1.3 10*3/uL (ref 0.7–4.0)
MCH: 29.5 pg (ref 26.0–34.0)
MCHC: 32 g/dL (ref 30.0–36.0)
MCV: 92.1 fL (ref 78.0–100.0)
Monocytes Absolute: 0.4 10*3/uL (ref 0.1–1.0)
Monocytes Relative: 9 % (ref 3–12)
Neutro Abs: 2.4 10*3/uL (ref 1.7–7.7)
Neutrophils Relative %: 54 % (ref 43–77)
Platelets: 122 10*3/uL — ABNORMAL LOW (ref 150–400)
RBC: 3.56 MIL/uL — ABNORMAL LOW (ref 3.87–5.11)
RDW: 17.8 % — ABNORMAL HIGH (ref 11.5–15.5)
WBC: 4.4 10*3/uL (ref 4.0–10.5)

## 2012-10-25 LAB — PROTIME-INR
INR: 1.47 (ref 0.00–1.49)
Prothrombin Time: 17.4 seconds — ABNORMAL HIGH (ref 11.6–15.2)

## 2012-10-25 MED ORDER — OXYCODONE-ACETAMINOPHEN 5-325 MG PO TABS
2.0000 | ORAL_TABLET | Freq: Once | ORAL | Status: AC
  Administered 2012-10-25: 2 via ORAL
  Filled 2012-10-25: qty 2

## 2012-10-25 NOTE — ED Notes (Signed)
MD at bedside. Consult hospitalist.

## 2012-10-25 NOTE — ED Notes (Signed)
Pt returns from xray. Family at bedside.

## 2012-10-25 NOTE — H&P (Addendum)
Patient's PCP: Michele Mcalpine, MD  Chief Complaint: Loss of consciousness  History of Present Illness: [name] is a 72 y.o. Caucasian female with history of coronary artery disease status post CABG, atrial fibrillation on Coumadin for chronic anticoagulation, COPD, chronic kidney disease stage III, history of GI bleed in September of 2013 with negative EGD and colonoscopy, and chronic low back pain who presents with the above complaints.  Patient reports that over the last month to month and a half she has had intermittent episodes of loss of consciousness which happens suddenly.  Her last episode was about a week ago on 10/18/2012, she had a head CT which was negative.  Today this evening she had another episode of loss of consciousness while she was walking to the bathroom.  She denied any dizziness, lightheadedness, chest pain, or shortness of breath prior to loss of consciousness.  She is not sure how long she was on the ground.  But she woke up feeling pain from multiple contusions she sustained.  She did not lose control of her bowel or bladder.  She denies any recent fevers, chills, nausea, vomiting, chest pain, shortness of breath, abdominal pain, diarrhea, headaches or vision changes.  Review of Systems: All systems reviewed with the patient and positive as per history of present illness, otherwise all other systems are negative.  Past Medical History  Diagnosis Date  . Coronary atherosclerosis of unspecified type of vessel, native or graft   . Atrial fibrillation 01/19/2012    stress test - non-gated secondary to A fib,  normal myocaridal perfusion study  . Unspecified venous (peripheral) insufficiency   . Other and unspecified hyperlipidemia   . Morbid obesity   . Esophageal reflux   . Irritable bowel syndrome   . Other symptoms involving urinary system   . Chronic pain syndrome   . 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 Anxiety state, unspecified   . Herpes  zoster without mention of complication   . Herpes zoster with other nervous system complications   . Lumbago   . Anticoagulated on Coumadin, chronic for A. Fib 04/08/2012  . COPD (chronic obstructive pulmonary disease)   . 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 Pneumonia 4 years ago  . Bronchitis, chronic   . Fibromyalgia   . Arthritis   . CAD (coronary artery disease) 04/29/2006    echo -EF 45-50%, impaired LV relaxation  . 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 GI bleed 04/07/2012    endoscopy/colonoscopy unremarkable  . 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 DJD (degenerative joint disease)    Past Surgical History  Procedure Laterality Date  . Vesicovaginal fistula closure w/ tah    . Cardiac catheterization  01/06/2010    RCA mid artery stenosis at 99%, LAD proximal occlusion 90%  at origin of ramus  . Nissen fundoplication    . Esophagogastroduodenoscopy  04/09/2012    Procedure: ESOPHAGOGASTRODUODENOSCOPY (EGD);  Surgeon: Theda Belfast, MD;  Location: [facility] ENDOSCOPY;  Service: Endoscopy;  Laterality: N/A;  tentatively scheduled per Blase Mess due to patients breathing problems  . Colonoscopy  04/11/2012    Procedure: COLONOSCOPY;  Surgeon: Meryl Dare, MD,FACG;  Location: [facility] ENDOSCOPY;  Service: Endoscopy;  Laterality: N/A;  . Abdominal hysterectomy  at 72 years old  . Appendectomy  about 50 years ago  . Coronary artery bypass graft  1997    LIMA to  LAD, SVG to ramus, SVG to RCA  . Cholecystectomy  50 years ago  . Neck surgery  72 years old    full movement  . Right knee arthroscopy  3 years ago  . Eye surgery  2005    cataracts both eyes  . Back surgery      x5, "birdcage" and fused in lower back  . Total knee arthroplasty  06/28/2012    Procedure: TOTAL KNEE ARTHROPLASTY;  Surgeon: Eugenia Mcalpine, MD;  Location: [facility] ORS;  Service: Orthopedics;  Laterality: Left;  . Joint replacement Left 06/28/12     knee replacement   Family History  Problem Relation Age of Onset  . Heart disease Mother   . Lung disease Father   . Kidney disease     History   Social History  . Marital Status: Married    Spouse Name: Baldo Ash    Number of Children: N/A  . Years of Education: N/A   Occupational History  . Retired   .     Social History Main Topics  . Smoking status: Former Smoker -- 1.00 packs/day for 22 years    Types: Cigarettes    Quit date: 07/20/1984  . Smokeless tobacco: Never Used  . Alcohol Use: No  . Drug Use: No  . Sexually Active: Not on file   Other Topics Concern  . Not on file   Social History Narrative  . No narrative on file   Allergies: Review of patient's allergies indicates no known allergies.  Home Meds: Prior to Admission medications   Medication Sig Start Date End Date Taking? Authorizing Provider  albuterol (PROVENTIL) (2.5 MG/3ML) 0.083% nebulizer solution Take 2.5 mg by nebulization 3 (three) times daily as needed for wheezing or shortness of breath.   Yes Historical Provider, MD  ALPRAZolam Prudy Feeler) 0.5 MG tablet Take 0.5 mg by mouth 4 (four) times daily.   Yes Historical Provider, MD  amitriptyline (ELAVIL) 50 MG tablet Take 50 mg by mouth at bedtime.   Yes Historical Provider, MD  aspirin EC 81 MG tablet Take 81 mg by mouth every evening.    Yes Historical Provider, MD  Calcium Carbonate-Vitamin D (CALCIUM-VITAMIN D) 500-200 MG-UNIT per tablet Take 1 tablet by mouth 2 (two) times daily with a meal.   Yes Historical Provider, MD  cephALEXin (KEFLEX) 500 MG capsule Take 1 capsule (500 mg total) by mouth 4 (four) times daily. 10/18/12  Yes Ethelda Chick, MD  docusate sodium (COLACE) 100 MG capsule Take 200 mg by mouth at bedtime.   Yes Historical Provider, MD  ferrous sulfate 325 (65 FE) MG tablet Take 325 mg by mouth every evening.   Yes Historical Provider, MD  furosemide (LASIX) 40 MG tablet Take 40 mg by mouth 2 (two) times daily.    Yes Historical Provider,  MD  gabapentin (NEURONTIN) 600 MG tablet Take 600 mg by mouth 4 (four) times daily.   Yes Historical Provider, MD  guaiFENesin (MUCINEX) 600 MG 12 hr tablet Take 1,200 mg by mouth 2 (two) times daily.   Yes Historical Provider, MD  hydrocortisone cream 1 % Apply 1 application topically 2 (two) times daily.   Yes Historical Provider, MD  isosorbide mononitrate (IMDUR) 30 MG 24 hr tablet Take 30 mg by mouth at bedtime.    Yes Historical Provider, MD  metoprolol (LOPRESSOR) 50 MG tablet Take 50 mg by mouth 2 (two) times daily.   Yes Historical Provider, MD  Multiple Vitamin (MULTIVITAMIN WITH MINERALS) TABS  Take 1 tablet by mouth daily.   Yes Historical Provider, MD  omeprazole (PRILOSEC) 20 MG capsule Take 20 mg by mouth 2 (two) times daily.   Yes Historical Provider, MD  oxyCODONE-acetaminophen (PERCOCET/ROXICET) 5-325 MG per tablet Take 2 tablets by mouth every 4 (four) hours as needed for pain.   Yes Historical Provider, MD  potassium chloride SA (K-DUR,KLOR-CON) 20 MEQ tablet Take 20 mEq by mouth 2 (two) times daily.   Yes Historical Provider, MD  promethazine (PHENERGAN) 25 MG tablet Take 25 mg by mouth every 6 (six) hours as needed for nausea.   Yes Historical Provider, MD  Simethicone (GAS-X PO) Take 1 tablet by mouth daily as needed (gas).   Yes Historical Provider, MD  simvastatin (ZOCOR) 20 MG tablet Take 20 mg by mouth at bedtime.    Yes Historical Provider, MD  warfarin (COUMADIN) 5 MG tablet Take 2.5-5 mg by mouth daily. 5mg  Sunday, Tuesday, Thursday, and Saturday   2.5mg  on monday,wednesday,friday   Yes Historical Provider, MD  vitamin B-12 (CYANOCOBALAMIN) 1000 MCG tablet Take 1,000 mcg by mouth daily.     Historical Provider, MD    Physical Exam: Blood pressure 124/82, pulse 92, temperature 98.4 F (36.9 C), temperature source Oral, resp. rate 22, SpO2 96.00%. General: Awake, Oriented x3, No acute distress. HEENT: EOMI, Moist mucous membranes Neck: Supple CV: S1 and S2 Lungs:  Scattered crackles at bases bilaterally, good respiratory effort. Abdomen: Soft, Nontender, Nondistended, +bowel sounds. Ext: Good pulses. Trace edema. No clubbing or cyanosis noted. Neuro: Cranial Nerves II-XII grossly intact. Has 5/5 motor strength in upper and lower extremities. Skin: Multiple quarter sized contusions.   Lab results:  Recent Labs  10/25/12 1919  NA 136  K 4.4  CL 96  CO2 32  GLUCOSE 107*  BUN 27*  CREATININE 1.56*  CALCIUM 9.2    Recent Labs  10/25/12 1919  AST 22  ALT 9  ALKPHOS 123*  BILITOT 0.5  PROT 7.8  ALBUMIN 3.9   No results found for this basename: LIPASE, AMYLASE,  in the last 72 hours  Recent Labs  10/25/12 1919  WBC 4.4  NEUTROABS 2.4  HGB 10.5*  HCT 32.8*  MCV 92.1  PLT 122*   No results found for this basename: CKTOTAL, CKMB, CKMBINDEX, TROPONINI,  in the last 72 hours No components found with this basename: POCBNP,  No results found for this basename: DDIMER,  in the last 72 hours No results found for this basename: HGBA1C,  in the last 72 hours No results found for this basename: CHOL, HDL, LDLCALC, TRIG, CHOLHDL, LDLDIRECT,  in the last 72 hours No results found for this basename: TSH, T4TOTAL, FREET3, T3FREE, THYROIDAB,  in the last 72 hours No results found for this basename: VITAMINB12, FOLATE, FERRITIN, TIBC, IRON, RETICCTPCT,  in the last 72 hours Imaging results:  Dg Elbow Complete Right  10/18/2012  *RADIOLOGY REPORT*  Clinical Data: Fall, elbow pain/bruising  RIGHT ELBOW - COMPLETE 3+ VIEW  Comparison: None.  Findings: No fracture or dislocation is seen.  The joint spaces are preserved.  The visualized soft tissues are unremarkable.  No displaced elbow joint fat pads to suggest an elbow joint effusion.  IMPRESSION: No fracture or dislocation is seen.   Original Report Authenticated By: Charline Bills, M.D.    Dg Wrist Complete Right  10/18/2012  *RADIOLOGY REPORT*  Clinical Data: Fall, right wrist pain  RIGHT WRIST -  COMPLETE 3+ VIEW  Comparison: None.  Findings: No fracture  or dislocation is seen.  The joint spaces are preserved.  The visualized soft tissues are unremarkable.  IMPRESSION: No fracture or dislocation is seen.   Original Report Authenticated By: Charline Bills, M.D.    Ct Head Wo Contrast (only If Suspected Head Trauma And/or Pt Is On Anticoagulant)  10/18/2012  *RADIOLOGY REPORT*  Clinical Data: Syncopal episodes  CT HEAD WITHOUT CONTRAST  Technique:  Contiguous axial images were obtained from the base of the skull through the vertex without contrast.  Comparison: 10/28/2004  Findings:  Grossly unchanged mild diffuse atrophy with prominence of the bifrontal extra-axial spaces and centralized volume loss with commiserate ex vacuo dilatation of the ventricular system. Scattered minimal periventricular hypodensities compatible microvascular ischemic disease.  Given background parenchymal abnormalities, there is no CT evidence of acute large territory infarct.  No intraparenchymal or extra-axial mass or hemorrhage. Normal size and configuration of the ventricles and basilar cisterns.  No midline shift.  Limited visualization of the paranasal sinuses and mastoid air cells are normal.  Regional soft tissues are normal.  No displaced calvarial fracture.  Post left- sided cataract surgery.  IMPRESSION:  1.  No acute intracranial process. 2.  Stable findings of atrophy and mild microvascular ischemic disease.   Original Report Authenticated By: Tacey Ruiz, MD    Dg Foot Complete Right  10/25/2012  *RADIOLOGY REPORT*  Clinical Data: Fall, bruising, pain .  RIGHT FOOT COMPLETE - 3+ VIEW  Comparison: None.  Findings: bones are osteopenic.  Minor degenerative changes of the interphalangeal joints.  Normal alignment without fracture evident  IMPRESSION: No acute osseous finding   Original Report Authenticated By: [name]. Miles Costain, M.D.    Other results: EKG: Atrial fibrillation with heart rate of 93.  Assessment & Plan by  Problem: Syncope Etiology unclear.  Has had a head CT on 10/18/2012, patient is neurologically intact.  Will admit the patient to telemetry and cycle cardiac enzymes to rule the patient for acute coronary syndrome.  Will get an MRI of the brain and carotid Dopplers.  Will get 2-D echocardiogram for further evaluation.  Check patient's orthostatics however she has received fluids in the ER.  A. Fib Rate controlled.  INR subtherapeutic, coumadin dosing as per pharmacy.  Chronic kidney disease stage III Renal function at baseline.  Continue to monitor.  Anemia Likely due to chronic disease.  Recent history of GI bleed status post blood transfusion.  COPD  Stable.    Coronary artery disease status post CABG  Continue aspirin and metoprolol.  Stable.  Recent history of urinary tract infection Patient to complete the last course of Keflex today.    Degenerative joint disease/chronic low back pain  Stable.    Hypertension and hyperlipidemia Continue home antihypertensive medications.  Morbid obesity Diet and exercise as outpatient.  Prophylaxis Continue anticoagulation on Coumadin.  Disposition Admit the patient as observation to telemetry.  Plan discussed with husband at bedside.  Time spent on admission, talking to the patient, and coordinating care was: 60 mins.  Symir Mah A, MD 10/25/2012, 11:42 PM

## 2012-10-25 NOTE — ED Notes (Signed)
PT. REPORTS SYNCOPAL EPISODE AND FELL AT BATHROOM AT HOME THIS EVENING , INJURED RIGHT FOOT WITH BRUISE , PT. STATED MULTIPLE EPISODES OF SYNCOPE FOR SEVERAL DAYS SEEN BY HIS PCP ( DR. NADEL) YESTERDAY ARRANGED MRI THIS COMING Thursday AND ULTRASOUND OF CAROTIC ARTERY THIS WEEK . RECENTLY COMPLETED ANTIBIOTIC FOR UTI.

## 2012-10-26 ENCOUNTER — Encounter (HOSPITAL_COMMUNITY): Payer: Self-pay | Admitting: Cardiology

## 2012-10-26 ENCOUNTER — Observation Stay (HOSPITAL_COMMUNITY): Payer: Medicare Other

## 2012-10-26 DIAGNOSIS — R55 Syncope and collapse: Principal | ICD-10-CM

## 2012-10-26 DIAGNOSIS — I10 Essential (primary) hypertension: Secondary | ICD-10-CM

## 2012-10-26 DIAGNOSIS — I4891 Unspecified atrial fibrillation: Secondary | ICD-10-CM

## 2012-10-26 LAB — CBC
HCT: 30.5 % — ABNORMAL LOW (ref 36.0–46.0)
MCH: 29.3 pg (ref 26.0–34.0)
MCHC: 32.1 g/dL (ref 30.0–36.0)
RDW: 18.1 % — ABNORMAL HIGH (ref 11.5–15.5)

## 2012-10-26 LAB — URINALYSIS, ROUTINE W REFLEX MICROSCOPIC
Bilirubin Urine: NEGATIVE
Glucose, UA: NEGATIVE mg/dL
Hgb urine dipstick: NEGATIVE
Ketones, ur: NEGATIVE mg/dL
Leukocytes, UA: NEGATIVE
Nitrite: NEGATIVE
Protein, ur: NEGATIVE mg/dL
Specific Gravity, Urine: 1.01 (ref 1.005–1.030)
Urobilinogen, UA: 1 mg/dL (ref 0.0–1.0)
pH: 6.5 (ref 5.0–8.0)

## 2012-10-26 LAB — BASIC METABOLIC PANEL
BUN: 28 mg/dL — ABNORMAL HIGH (ref 6–23)
Creatinine, Ser: 1.48 mg/dL — ABNORMAL HIGH (ref 0.50–1.10)
GFR calc Af Amer: 40 mL/min — ABNORMAL LOW (ref >90)
GFR calc non Af Amer: 34 mL/min — ABNORMAL LOW (ref >90)
Glucose, Bld: 89 mg/dL (ref 70–99)

## 2012-10-26 LAB — PROTIME-INR
INR: 1.56 — ABNORMAL HIGH (ref 0.00–1.49)
Prothrombin Time: 18.2 seconds — ABNORMAL HIGH (ref 11.6–15.2)

## 2012-10-26 LAB — TROPONIN I: Troponin I: <0.3 ng/mL (ref <0.30)

## 2012-10-26 MED ORDER — WARFARIN - PHARMACIST DOSING INPATIENT: Freq: Every day | Status: DC

## 2012-10-26 MED ORDER — WARFARIN SODIUM 2.5 MG PO TABS
2.5000 mg | ORAL_TABLET | Freq: Once | ORAL | Status: AC
  Administered 2012-10-26: 2.5 mg via ORAL
  Filled 2012-10-26: qty 1

## 2012-10-26 MED ORDER — PANTOPRAZOLE SODIUM 40 MG PO TBEC
40.0000 mg | DELAYED_RELEASE_TABLET | Freq: Two times a day (BID) | ORAL | Status: DC
  Administered 2012-10-26 – 2012-10-28 (×5): 40 mg via ORAL
  Filled 2012-10-26 (×3): qty 1

## 2012-10-26 MED ORDER — CALCIUM CARBONATE-VITAMIN D 500-200 MG-UNIT PO TABS
1.0000 | ORAL_TABLET | Freq: Two times a day (BID) | ORAL | Status: DC
  Administered 2012-10-26 – 2012-10-28 (×4): 1 via ORAL
  Filled 2012-10-26 (×8): qty 1

## 2012-10-26 MED ORDER — GUAIFENESIN ER 600 MG PO TB12
1200.0000 mg | ORAL_TABLET | Freq: Two times a day (BID) | ORAL | Status: DC
  Administered 2012-10-26 – 2012-10-28 (×5): 1200 mg via ORAL
  Filled 2012-10-26 (×8): qty 2

## 2012-10-26 MED ORDER — PROMETHAZINE HCL 25 MG PO TABS: 25.0000 mg | ORAL_TABLET | Freq: Four times a day (QID) | ORAL | Status: DC | PRN

## 2012-10-26 MED ORDER — ASPIRIN EC 81 MG PO TBEC
81.0000 mg | DELAYED_RELEASE_TABLET | Freq: Every evening | ORAL | Status: DC
  Administered 2012-10-26 – 2012-10-27 (×2): 81 mg via ORAL
  Filled 2012-10-26 (×3): qty 1

## 2012-10-26 MED ORDER — ONDANSETRON HCL 4 MG PO TABS: 4.0000 mg | ORAL_TABLET | Freq: Four times a day (QID) | ORAL | Status: DC | PRN

## 2012-10-26 MED ORDER — ADULT MULTIVITAMIN W/MINERALS CH
1.0000 | ORAL_TABLET | Freq: Every day | ORAL | Status: DC
  Administered 2012-10-26 – 2012-10-28 (×3): 1 via ORAL
  Filled 2012-10-26 (×3): qty 1

## 2012-10-26 MED ORDER — ALUM & MAG HYDROXIDE-SIMETH 200-200-20 MG/5ML PO SUSP
30.0000 mL | ORAL | Status: DC | PRN
  Administered 2012-10-26: 30 mL via ORAL
  Filled 2012-10-26: qty 30

## 2012-10-26 MED ORDER — OXYCODONE-ACETAMINOPHEN 5-325 MG PO TABS
2.0000 | ORAL_TABLET | ORAL | Status: DC | PRN
  Administered 2012-10-26 – 2012-10-28 (×6): 2 via ORAL
  Filled 2012-10-26 (×6): qty 2

## 2012-10-26 MED ORDER — AMITRIPTYLINE HCL 50 MG PO TABS
50.0000 mg | ORAL_TABLET | Freq: Every day | ORAL | Status: DC
  Administered 2012-10-26 – 2012-10-27 (×2): 50 mg via ORAL
  Filled 2012-10-26 (×3): qty 1

## 2012-10-26 MED ORDER — WARFARIN SODIUM 2.5 MG PO TABS: 2.5000 mg | ORAL_TABLET | Freq: Every day | ORAL | Status: DC

## 2012-10-26 MED ORDER — SIMVASTATIN 20 MG PO TABS
20.0000 mg | ORAL_TABLET | Freq: Every day | ORAL | Status: DC
Start: 2012-10-26 — End: 2012-10-28
  Administered 2012-10-26 – 2012-10-27 (×2): 20 mg via ORAL
  Filled 2012-10-26 (×4): qty 1

## 2012-10-26 MED ORDER — CEPHALEXIN 500 MG PO CAPS
500.0000 mg | ORAL_CAPSULE | Freq: Four times a day (QID) | ORAL | Status: AC
  Administered 2012-10-26: 500 mg via ORAL
  Filled 2012-10-26: qty 1

## 2012-10-26 MED ORDER — ISOSORBIDE MONONITRATE ER 30 MG PO TB24
30.0000 mg | ORAL_TABLET | Freq: Every day | ORAL | Status: DC
  Administered 2012-10-26 – 2012-10-27 (×3): 30 mg via ORAL
  Filled 2012-10-26 (×4): qty 1

## 2012-10-26 MED ORDER — DOCUSATE SODIUM 100 MG PO CAPS
200.0000 mg | ORAL_CAPSULE | Freq: Every day | ORAL | Status: DC
  Administered 2012-10-26 – 2012-10-27 (×2): 200 mg via ORAL
  Filled 2012-10-26 (×4): qty 2

## 2012-10-26 MED ORDER — ALBUTEROL SULFATE (5 MG/ML) 0.5% IN NEBU
2.5000 mg | INHALATION_SOLUTION | Freq: Four times a day (QID) | RESPIRATORY_TRACT | Status: DC | PRN
  Administered 2012-10-27 – 2012-10-28 (×3): 2.5 mg via RESPIRATORY_TRACT
  Filled 2012-10-26 (×3): qty 0.5

## 2012-10-26 MED ORDER — ALPRAZOLAM 0.5 MG PO TABS
0.5000 mg | ORAL_TABLET | Freq: Four times a day (QID) | ORAL | Status: DC
Start: 2012-10-26 — End: 2012-10-28
  Administered 2012-10-26 – 2012-10-28 (×9): 0.5 mg via ORAL
  Filled 2012-10-26 (×10): qty 1

## 2012-10-26 MED ORDER — POTASSIUM CHLORIDE CRYS ER 20 MEQ PO TBCR
20.0000 meq | EXTENDED_RELEASE_TABLET | Freq: Two times a day (BID) | ORAL | Status: DC
  Administered 2012-10-26 – 2012-10-28 (×5): 20 meq via ORAL
  Filled 2012-10-26 (×7): qty 1

## 2012-10-26 MED ORDER — GABAPENTIN 600 MG PO TABS
600.0000 mg | ORAL_TABLET | Freq: Three times a day (TID) | ORAL | Status: DC
Start: 2012-10-26 — End: 2012-10-28
  Administered 2012-10-26 – 2012-10-28 (×10): 600 mg via ORAL
  Filled 2012-10-26 (×14): qty 1

## 2012-10-26 MED ORDER — ACETAMINOPHEN 325 MG PO TABS: 650.0000 mg | ORAL_TABLET | Freq: Four times a day (QID) | ORAL | Status: DC | PRN

## 2012-10-26 MED ORDER — ACETAMINOPHEN 650 MG RE SUPP: 650.0000 mg | Freq: Four times a day (QID) | RECTAL | Status: DC | PRN

## 2012-10-26 MED ORDER — ONDANSETRON HCL 4 MG/2ML IJ SOLN: 4.0000 mg | Freq: Four times a day (QID) | INTRAMUSCULAR | Status: DC | PRN

## 2012-10-26 MED ORDER — METOPROLOL TARTRATE 50 MG PO TABS
50.0000 mg | ORAL_TABLET | Freq: Two times a day (BID) | ORAL | Status: DC
  Administered 2012-10-26 – 2012-10-28 (×5): 50 mg via ORAL
  Filled 2012-10-26 (×7): qty 1

## 2012-10-26 MED ORDER — FERROUS SULFATE 325 (65 FE) MG PO TABS
325.0000 mg | ORAL_TABLET | Freq: Every evening | ORAL | Status: DC
  Administered 2012-10-26 – 2012-10-27 (×2): 325 mg via ORAL
  Filled 2012-10-26 (×3): qty 1

## 2012-10-26 MED ORDER — FUROSEMIDE 40 MG PO TABS
40.0000 mg | ORAL_TABLET | Freq: Two times a day (BID) | ORAL | Status: DC
  Administered 2012-10-26 – 2012-10-28 (×5): 40 mg via ORAL
  Filled 2012-10-26 (×6): qty 1

## 2012-10-26 MED ORDER — NITROGLYCERIN 0.4 MG SL SUBL: 0.4000 mg | SUBLINGUAL_TABLET | SUBLINGUAL | Status: DC | PRN

## 2012-10-26 NOTE — Consult Note (Signed)
Reason for Consult: syncope   Referring Physician: Dr. Brien Few   Cardiologist: Dr. Allyson Sabal PCP: Dr. Alroy Dust  [name] is an 72 y.o. female.    Chief Complaint:  Loss of consciousness  HPI: 72 y.o. Caucasian female with history of coronary artery disease status post CABG 1997 (cath done 12/2009 with patent grafts, and negative myoview in 3013),  permanent atrial fibrillation on Coumadin for chronic anticoagulation, COPD, chronic kidney disease stage III, history of GI bleed in September of 2013 with negative EGD and colonoscopy, and chronic low back pain who presents with the above complaints. Patient reports that over the last month to month and a half she has had intermittent episodes of loss of consciousness which happens suddenly. Her last episode was about a week ago on 10/18/2012, she had a head CT which was negative. Evening of 10/25/12 she had another episode of loss of consciousness while she was walking to the bathroom. She denied any dizziness, lightheadedness, chest pain, or shortness of breath prior to loss of consciousness. She is not sure how long she was on the ground. But she woke up feeling pain from multiple contusions she sustained. She did not lose control of her bowel or bladder. She presented to the ER and was admitted.  She denied any recent fevers, chills, nausea, vomiting, chest pain, shortness of breath, abdominal pain, diarrhea, headaches or vision changes.   Currently troponin I's all negative.  EKG with non specific ST depression laterally.  She had rec'd fluids in the ER so difficult to determine orthostatic hypotension on arrival.  INR sub therapeutic at 1.57 on admit.   Echo has been done but not yet read.  Previous Echo 04/29/06 with EF 45-50% with suggestion of impaired relaxation, LA mild to moderately dilated.  RV systolic pressure 30-40 mmHG. With mild pul. HTN.Mild MR, Tr. Aortic regurgitation. Carotid Duplex (Doppler) has been completed. Preliminary  findings: Right= <40% ICA stenosis. Left = 40-59% ICA stenosis. Vertebral artery flow is antegrade.   No chest pain until today, rt. Side lasted 30 min, more of sharp pain to back, worse with deep breath.  Past Medical History  Diagnosis Date  . Coronary atherosclerosis of unspecified type of vessel, native or graft   . Atrial fibrillation 01/19/2012    stress test - non-gated secondary to A fib,  normal myocaridal perfusion study  . Unspecified venous (peripheral) insufficiency   . Other and unspecified hyperlipidemia   . Morbid obesity   . Esophageal reflux   . Irritable bowel syndrome   . Other symptoms involving urinary system   . Chronic pain syndrome   . 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 Anxiety state, unspecified   . Herpes zoster without mention of complication   . Herpes zoster with other nervous system complications   . Lumbago   . Anticoagulated on Coumadin, chronic for A. Fib 04/08/2012  . COPD (chronic obstructive pulmonary disease)   . 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 Pneumonia 4 years ago  . Bronchitis, chronic   . Fibromyalgia   . Arthritis   . CAD (coronary artery disease) 04/29/2006    echo -EF 45-50%, impaired LV relaxation  . 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 GI bleed 04/07/2012    endoscopy/colonoscopy unremarkable  . 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 DJD (degenerative joint disease)     Past Surgical History  Procedure Laterality  Date  . Vesicovaginal fistula closure w/ tah    . Cardiac catheterization  01/06/2010    RCA mid artery stenosis at 99%, LAD proximal occlusion 90%  at origin of ramus  . Nissen fundoplication    . Esophagogastroduodenoscopy  04/09/2012    Procedure: ESOPHAGOGASTRODUODENOSCOPY (EGD);  Surgeon: Theda Belfast, MD;  Location: [facility] ENDOSCOPY;  Service: Endoscopy;  Laterality: N/A;  tentatively scheduled per Blase Mess due to patients  breathing problems  . Colonoscopy  04/11/2012    Procedure: COLONOSCOPY;  Surgeon: Meryl Dare, MD,FACG;  Location: [facility] ENDOSCOPY;  Service: Endoscopy;  Laterality: N/A;  . Abdominal hysterectomy  at 72 years old  . Appendectomy  about 50 years ago  . Coronary artery bypass graft  1997    LIMA to LAD, SVG to ramus, SVG to RCA  . Cholecystectomy  50 years ago  . Neck surgery  72 years old    full movement  . Right knee arthroscopy  3 years ago  . Eye surgery  2005    cataracts both eyes  . Back surgery      x5, "birdcage" and fused in lower back  . Total knee arthroplasty  06/28/2012    Procedure: TOTAL KNEE ARTHROPLASTY;  Surgeon: Eugenia Mcalpine, MD;  Location: [facility] ORS;  Service: Orthopedics;  Laterality: Left;  . Joint replacement Left 06/28/12    knee replacement    Family History  Problem Relation Age of Onset  . Heart disease Mother   . Lung disease Father   . Kidney disease     Social History:  reports that she quit smoking about 28 years ago. Her smoking use included Cigarettes. She has a 22 pack-year smoking history. She has never used smokeless tobacco. She reports that she does not drink alcohol or use illicit drugs.  Allergies: No Known Allergies  Medications Prior to Admission  Medication Sig Dispense Refill  . albuterol (PROVENTIL) (2.5 MG/3ML) 0.083% nebulizer solution Take 2.5 mg by nebulization 3 (three) times daily as needed for wheezing or shortness of breath.      . ALPRAZolam (XANAX) 0.5 MG tablet Take 0.5 mg by mouth 4 (four) times daily.      Marland Kitchen amitriptyline (ELAVIL) 50 MG tablet Take 50 mg by mouth at bedtime.      Marland Kitchen aspirin EC 81 MG tablet Take 81 mg by mouth every evening.       . Calcium Carbonate-Vitamin D (CALCIUM-VITAMIN D) 500-200 MG-UNIT per tablet Take 1 tablet by mouth 2 (two) times daily with a meal.      . cephALEXin (KEFLEX) 500 MG capsule Take 1 capsule (500 mg total) by mouth 4 (four) times daily.  28 capsule  0  . docusate sodium (COLACE)  100 MG capsule Take 200 mg by mouth at bedtime.      . ferrous sulfate 325 (65 FE) MG tablet Take 325 mg by mouth every evening.      . furosemide (LASIX) 40 MG tablet Take 40 mg by mouth 2 (two) times daily.       Marland Kitchen gabapentin (NEURONTIN) 600 MG tablet Take 600 mg by mouth 4 (four) times daily.      Marland Kitchen guaiFENesin (MUCINEX) 600 MG 12 hr tablet Take 1,200 mg by mouth 2 (two) times daily.      . hydrocortisone cream 1 % Apply 1 application topically 2 (two) times daily.      . isosorbide mononitrate (IMDUR) 30 MG 24 hr tablet Take 30 mg by  mouth at bedtime.       . metoprolol (LOPRESSOR) 50 MG tablet Take 50 mg by mouth 2 (two) times daily.      . Multiple Vitamin (MULTIVITAMIN WITH MINERALS) TABS Take 1 tablet by mouth daily.      Marland Kitchen omeprazole (PRILOSEC) 20 MG capsule Take 20 mg by mouth 2 (two) times daily.      Marland Kitchen oxyCODONE-acetaminophen (PERCOCET/ROXICET) 5-325 MG per tablet Take 2 tablets by mouth every 4 (four) hours as needed for pain.      . potassium chloride SA (K-DUR,KLOR-CON) 20 MEQ tablet Take 20 mEq by mouth 2 (two) times daily.      . promethazine (PHENERGAN) 25 MG tablet Take 25 mg by mouth every 6 (six) hours as needed for nausea.      . Simethicone (GAS-X PO) Take 1 tablet by mouth daily as needed (gas).      . simvastatin (ZOCOR) 20 MG tablet Take 20 mg by mouth at bedtime.       Marland Kitchen warfarin (COUMADIN) 5 MG tablet Take 2.5-5 mg by mouth daily. 5mg  Sunday, Tuesday, Thursday, and Saturday   2.5mg  on monday,wednesday,friday      . vitamin B-12 (CYANOCOBALAMIN) 1000 MCG tablet Take 1,000 mcg by mouth daily.         Results for orders placed during the hospital encounter of 10/25/12 (from the past 48 hour(s))  CBC WITH DIFFERENTIAL     Status: Abnormal   Collection Time    10/25/12  7:19 PM      Result Value Range   WBC 4.4  4.0 - 10.5 K/uL   RBC 3.56 (*) 3.87 - 5.11 MIL/uL   Hemoglobin 10.5 (*) 12.0 - 15.0 g/dL   HCT 19.1 (*) 47.8 - 29.5 %   MCV 92.1  78.0 - 100.0 fL   MCH  29.5  26.0 - 34.0 pg   MCHC 32.0  30.0 - 36.0 g/dL   RDW 62.1 (*) 30.8 - 65.7 %   Platelets 122 (*) 150 - 400 K/uL   Neutrophils Relative 54  43 - 77 %   Neutro Abs 2.4  1.7 - 7.7 K/uL   Lymphocytes Relative 29  12 - 46 %   Lymphs Abs 1.3  0.7 - 4.0 K/uL   Monocytes Relative 9  3 - 12 %   Monocytes Absolute 0.4  0.1 - 1.0 K/uL   Eosinophils Relative 7 (*) 0 - 5 %   Eosinophils Absolute 0.3  0.0 - 0.7 K/uL   Basophils Relative 2 (*) 0 - 1 %   Basophils Absolute 0.1  0.0 - 0.1 K/uL  COMPREHENSIVE METABOLIC PANEL     Status: Abnormal   Collection Time    10/25/12  7:19 PM      Result Value Range   Sodium 136  135 - 145 mEq/L   Potassium 4.4  3.5 - 5.1 mEq/L   Chloride 96  96 - 112 mEq/L   CO2 32  19 - 32 mEq/L   Glucose, Bld 107 (*) 70 - 99 mg/dL   BUN 27 (*) 6 - 23 mg/dL   Creatinine, Ser 8.46 (*) 0.50 - 1.10 mg/dL   Calcium 9.2  8.4 - 96.2 mg/dL   Total Protein 7.8  6.0 - 8.3 g/dL   Albumin 3.9  3.5 - 5.2 g/dL   AST 22  0 - 37 U/L   ALT 9  0 - 35 U/L   Alkaline Phosphatase 123 (*) 39 - 117 U/L  Total Bilirubin 0.5  0.3 - 1.2 mg/dL   GFR calc non Af Amer 32 (*) >90 mL/min   GFR calc Af Amer 37 (*) >90 mL/min   Comment:            The eGFR has been calculated     using the CKD EPI equation.     This calculation has not been     validated in all clinical     situations.     eGFR's persistently     <90 mL/min signify     possible Chronic Kidney Disease.  PROTIME-INR     Status: Abnormal   Collection Time    10/25/12 10:17 PM      Result Value Range   Prothrombin Time 17.4 (*) 11.6 - 15.2 seconds   INR 1.47  0.00 - 1.49  TROPONIN I     Status: None   Collection Time    10/25/12 11:31 PM      Result Value Range   Troponin I <0.30  <0.30 ng/mL   Comment:            Due to the release kinetics of cTnI,     a negative result within the first hours     of the onset of symptoms does not rule out     myocardial infarction with certainty.     If myocardial infarction is  still suspected,     repeat the test at appropriate intervals.  URINALYSIS, ROUTINE W REFLEX MICROSCOPIC     Status: None   Collection Time    10/25/12 11:40 PM      Result Value Range   Color, Urine YELLOW  YELLOW   APPearance CLEAR  CLEAR   Specific Gravity, Urine 1.010  1.005 - 1.030   pH 6.5  5.0 - 8.0   Glucose, UA NEGATIVE  NEGATIVE mg/dL   Hgb urine dipstick NEGATIVE  NEGATIVE   Bilirubin Urine NEGATIVE  NEGATIVE   Ketones, ur NEGATIVE  NEGATIVE mg/dL   Protein, ur NEGATIVE  NEGATIVE mg/dL   Urobilinogen, UA 1.0  0.0 - 1.0 mg/dL   Nitrite NEGATIVE  NEGATIVE   Leukocytes, UA NEGATIVE  NEGATIVE   Comment: MICROSCOPIC NOT DONE ON URINES WITH NEGATIVE PROTEIN, BLOOD, LEUKOCYTES, NITRITE, OR GLUCOSE <1000 mg/dL.  MRSA PCR SCREENING     Status: None   Collection Time    10/26/12  2:11 AM      Result Value Range   MRSA by PCR NEGATIVE  NEGATIVE   Comment:            The GeneXpert MRSA Assay (FDA     approved for NASAL specimens     only), is one component of a     comprehensive MRSA colonization     surveillance program. It is not     intended to diagnose MRSA     infection nor to guide or     monitor treatment for     MRSA infections.  TROPONIN I     Status: None   Collection Time    10/26/12  5:47 AM      Result Value Range   Troponin I <0.30  <0.30 ng/mL   Comment:            Due to the release kinetics of cTnI,     a negative result within the first hours     of the onset of symptoms does not rule out     myocardial  infarction with certainty.     If myocardial infarction is still suspected,     repeat the test at appropriate intervals.  BASIC METABOLIC PANEL     Status: Abnormal   Collection Time    10/26/12  5:47 AM      Result Value Range   Sodium 137  135 - 145 mEq/L   Potassium 4.0  3.5 - 5.1 mEq/L   Chloride 98  96 - 112 mEq/L   CO2 30  19 - 32 mEq/L   Glucose, Bld 89  70 - 99 mg/dL   BUN 28 (*) 6 - 23 mg/dL   Creatinine, Ser 2.95 (*) 0.50 - 1.10 mg/dL    Calcium 9.0  8.4 - 62.1 mg/dL   GFR calc non Af Amer 34 (*) >90 mL/min   GFR calc Af Amer 40 (*) >90 mL/min   Comment:            The eGFR has been calculated     using the CKD EPI equation.     This calculation has not been     validated in all clinical     situations.     eGFR's persistently     <90 mL/min signify     possible Chronic Kidney Disease.  CBC     Status: Abnormal   Collection Time    10/26/12  5:47 AM      Result Value Range   WBC 4.9  4.0 - 10.5 K/uL   RBC 3.34 (*) 3.87 - 5.11 MIL/uL   Hemoglobin 9.8 (*) 12.0 - 15.0 g/dL   HCT 30.8 (*) 65.7 - 84.6 %   MCV 91.3  78.0 - 100.0 fL   MCH 29.3  26.0 - 34.0 pg   MCHC 32.1  30.0 - 36.0 g/dL   RDW 96.2 (*) 95.2 - 84.1 %   Platelets 111 (*) 150 - 400 K/uL   Comment: REPEATED TO VERIFY     PLATELET COUNT CONFIRMED BY SMEAR  PROTIME-INR     Status: Abnormal   Collection Time    10/26/12  5:47 AM      Result Value Range   Prothrombin Time 18.2 (*) 11.6 - 15.2 seconds   INR 1.56 (*) 0.00 - 1.49  TROPONIN I     Status: None   Collection Time    10/26/12 11:10 AM      Result Value Range   Troponin I <0.30  <0.30 ng/mL   Comment:            Due to the release kinetics of cTnI,     a negative result within the first hours     of the onset of symptoms does not rule out     myocardial infarction with certainty.     If myocardial infarction is still suspected,     repeat the test at appropriate intervals.   Mr Brain Wo Contrast  10/26/2012  *RADIOLOGY REPORT*  Clinical Data: Multiple syncopal episodes over the last 90 days.  MRI HEAD WITHOUT CONTRAST  Technique:  Multiplanar, multiecho pulse sequences of the brain and surrounding structures were obtained according to standard protocol without intravenous contrast.  Comparison: CT 10/18/2012  Findings: Diffusion imaging does not show any acute or subacute infarction.  There are chronic small vessel changes affecting the pons.  There are old small vessel cerebellar  infarctions.  There are mild chronic small vessel changes of the cerebral hemispheric white matter.  No cortical or large vessel territory  infarction. No mass lesion, hemorrhage, hydrocephalus or extra-axial collection.  No pituitary mass.  No inflammatory sinus disease.  No skull or skull base lesion.  Major vessels at the base of the brain show flow.  IMPRESSION: No acute finding.  Atrophy and chronic small vessel disease.   Original Report Authenticated By: Paulina Fusi, M.D.    Dg Foot Complete Right  10/25/2012  *RADIOLOGY REPORT*  Clinical Data: Fall, bruising, pain .  RIGHT FOOT COMPLETE - 3+ VIEW  Comparison: None.  Findings: bones are osteopenic.  Minor degenerative changes of the interphalangeal joints.  Normal alignment without fracture evident  IMPRESSION: No acute osseous finding   Original Report Authenticated By: [name]. Shick, M.D.    2 D Echo, 10/26/12: - Left ventricle: The cavity size was normal. There was mild concentric hypertrophy. Systolic function was normal. The estimated ejection fraction was in the range of 55% to 65%, with beat to beat variation. LV diastolic function cannot be assessed due to atrial fibrillation. - Mitral valve: Calcified annulus. Mild regurgitation. - Left atrium: The atrium was moderately dilated. Volume index: 36.51ml/m^2 (S). - Right atrium: Severely dilated (31.6 cm2). - Tricuspid valve: Moderate regurgitation. - Pulmonary arteries: PA peak pressure: 49mm Hg (S). - Inferior vena cava: The vessel was dilated; the respirophasic diameter changes were blunted (< 50%); findings are consistent with elevated central venous pressure. - Pericardium, extracardiac: There was no pericardial effusion   ROS: General:no colds or fevers, no weight changes, good oral intake, "always has something to drink" Skin:+ wounds from falls, no rashes or ulcers, + bruising HEENT:no blurred vision, no congestion CV:see HPI, no chest pain PUL:see HPI no SOB if she takes her  nebulizer correctly. GI:no diarrhea constipation or once or twice with blood in her stool since OCT., no indigestion GU:no hematuria, no dysuria MS:no joint pain, no claudication, had Lt total knee done in December 2013 Neuro:+ syncope, no lightheadedness Endo:no diabetes, no thyroid disease   Blood pressure 130/90, pulse 103, temperature 97.7 F (36.5 C), temperature source Oral, resp. rate 20, height 5\' 8"  (1.727 m), weight 116.121 kg (256 lb), SpO2 92.00%. PE: General:NAD, pleasant affect.  Skin:warm and dry brisk capillary refill, small open wound rt arm, no drainage, abrasion Rt. Knee with bruising, Rt. Foot with hematoma.  HEENT:normocephalic, sclera, clear, mucus membranes moist Neck:supple, no JVD, no bruits Heart:irreg, irreg, no obvious murmur Lungs:clear without rales, rhonchi or wheezes- chest pain with deep breath. UJW:JXBJY, soft, non tender Ext:no edema, rt. Wrist with pain with movement, told it was sprained last week after a fall Neuro:alert and oriented X 3 MAE, follows commands, + facial symmetry.    Assessment/Plan Principal Problem:   Syncope and collapse Active Problems:   HYPERLIPIDEMIA   Morbid obesity   LOW BACK PAIN, CHRONIC   CAD (coronary artery disease), with CABG in 1997 and negative Nuc. 01/2012   Anticoagulated on Coumadin, chronic for A. Fib   atrial fib, permanent on coumadin for anticoagulation   DJD (degenerative joint disease)   COPD (chronic obstructive pulmonary disease)   Anemia, chronic disease   CKD (chronic kidney disease), stage III  PLAN: Chest pain today prob. Muscular Skeletal.  Sub therapeutic INR at 1.57 today on coumadin ? Heparin crossover?  Tele with afib but no significant arrhthymias noted.  Ambulate checking orthostatic BP.  Monitor will need 30 day event monitor at discharge.   INGOLD,LAURA R 10/26/2012, 3:58 PM   Patient seen and examined. Agree with assessment and plan. 72 yo WF  who is s/p CABG with history of  permanent AF on chronic coumadin. She apparently had a syncope spell last Thursday and sustained ecchymosis on left back/flank. She was given Keflex last week for UTI and had diarrhea over the weekend. She sustained another syncopal spell yesterday. ? Orthostatic documentation since patient had been administered fluids prior to recording. She denies recent anginal symptoms. Pt will need and event monitor and continue ortostatic BP assessment. Echo today shows normal LV function with EF ~ 60% with biatrial enlargement with mild MR, moderate TR and mild-moderate pulmonary hypertension. Will follow.   Lennette Bihari, MD, [facility] 10/26/2012 5:41 PM

## 2012-10-26 NOTE — Progress Notes (Signed)
  Comment: Informed that patient had an episode of chest/ epigastric pain.  Cardiac enzymes were cycled on admission, and have remained unelevated.   Plan:  1. Repeat CEs. 2. 12-Lead EKG. 3. SL NTG prn. 4. Maalox x 1 dose.  Will observe for now, and id f no new developments, will consult Dr Clarisse Gouge al, in AM 10/27/12.    Dr Brien Few. MD, FACP.

## 2012-10-26 NOTE — Progress Notes (Signed)
ANTICOAGULATION CONSULT NOTE - Follow Up Consult  Pharmacy Consult for Coumadin Indication: atrial fibrillation  No Known Allergies  Patient Measurements: Height: 5\' 8"  (172.7 cm) Weight: 256 lb (116.121 kg) IBW/kg (Calculated) : 63.9 Heparin Dosing Weight:    Vital Signs: Temp: 97.7 F (36.5 C) (04/09 0500) Temp src: Oral (04/09 0500) BP: 138/83 mmHg (04/09 0500) Pulse Rate: 96 (04/09 0500)  Labs:  Recent Labs  10/25/12 1919 10/25/12 2217 10/25/12 2331 10/26/12 0547  HGB 10.5*  --   --  9.8*  HCT 32.8*  --   --  30.5*  PLT 122*  --   --  111*  LABPROT  --  17.4*  --  18.2*  INR  --  1.47  --  1.56*  CREATININE 1.56*  --   --  1.48*  TROPONINI  --   --  <0.30 <0.30    Estimated Creatinine Clearance: 46.7 ml/min (by C-G formula based on Cr of 1.48).   Medications:  Prescriptions prior to admission  Medication Sig Dispense Refill  . albuterol (PROVENTIL) (2.5 MG/3ML) 0.083% nebulizer solution Take 2.5 mg by nebulization 3 (three) times daily as needed for wheezing or shortness of breath.      . ALPRAZolam (XANAX) 0.5 MG tablet Take 0.5 mg by mouth 4 (four) times daily.      Marland Kitchen amitriptyline (ELAVIL) 50 MG tablet Take 50 mg by mouth at bedtime.      Marland Kitchen aspirin EC 81 MG tablet Take 81 mg by mouth every evening.       . Calcium Carbonate-Vitamin D (CALCIUM-VITAMIN D) 500-200 MG-UNIT per tablet Take 1 tablet by mouth 2 (two) times daily with a meal.      . cephALEXin (KEFLEX) 500 MG capsule Take 1 capsule (500 mg total) by mouth 4 (four) times daily.  28 capsule  0  . docusate sodium (COLACE) 100 MG capsule Take 200 mg by mouth at bedtime.      . ferrous sulfate 325 (65 FE) MG tablet Take 325 mg by mouth every evening.      . furosemide (LASIX) 40 MG tablet Take 40 mg by mouth 2 (two) times daily.       Marland Kitchen gabapentin (NEURONTIN) 600 MG tablet Take 600 mg by mouth 4 (four) times daily.      Marland Kitchen guaiFENesin (MUCINEX) 600 MG 12 hr tablet Take 1,200 mg by mouth 2 (two) times  daily.      . hydrocortisone cream 1 % Apply 1 application topically 2 (two) times daily.      . isosorbide mononitrate (IMDUR) 30 MG 24 hr tablet Take 30 mg by mouth at bedtime.       . metoprolol (LOPRESSOR) 50 MG tablet Take 50 mg by mouth 2 (two) times daily.      . Multiple Vitamin (MULTIVITAMIN WITH MINERALS) TABS Take 1 tablet by mouth daily.      Marland Kitchen omeprazole (PRILOSEC) 20 MG capsule Take 20 mg by mouth 2 (two) times daily.      Marland Kitchen oxyCODONE-acetaminophen (PERCOCET/ROXICET) 5-325 MG per tablet Take 2 tablets by mouth every 4 (four) hours as needed for pain.      . potassium chloride SA (K-DUR,KLOR-CON) 20 MEQ tablet Take 20 mEq by mouth 2 (two) times daily.      . promethazine (PHENERGAN) 25 MG tablet Take 25 mg by mouth every 6 (six) hours as needed for nausea.      . Simethicone (GAS-X PO) Take 1 tablet by mouth daily  as needed (gas).      . simvastatin (ZOCOR) 20 MG tablet Take 20 mg by mouth at bedtime.       Marland Kitchen warfarin (COUMADIN) 5 MG tablet Take 2.5-5 mg by mouth daily. 5mg  Sunday, Tuesday, Thursday, and Saturday   2.5mg  on monday,wednesday,friday      . vitamin B-12 (CYANOCOBALAMIN) 1000 MCG tablet Take 1,000 mcg by mouth daily.         Assessment:Syncope  Anticoagulation: h/o afib. Coumadin 2.5 mg MWF 5 mg TTSS for Afib. INR 1.56 this am. Hgb down to 9.8. Plts 111 with known thrombocytopenia. GIB noted 9/13.  Infectious Disease: Afebrile. WBC 4.9. Pt on Keflex for UTI. Ends today 4/9.  Cardiovascular CAD s/p CABG, s/p PCI, Afib, HLD, ASA 81mg , po Lasix, Imdur, Metoprolol, K+.   Gastrointestinal / Nutrition IBS on PPI, MV, Ca+D,  Neurology: Scheduled Xanax, Elavil. Neurontin   Nephrology CRI with Scr 1.49  Pulmonary COPD  Hematology / Oncology: FESO4 for anemia  PTA Medication Issues: Zocor, B12 tab.  Goal of Therapy:  INR 2-3 Monitor platelets by anticoagulation protocol: Yes   Plan:  Coumadin 2.5mg  po x 1 again tonight with low INR.  Misty Stanley  Stillinger 10/26/2012,8:32 AM

## 2012-10-26 NOTE — Consult Note (Signed)
ELECTROPHYSIOLOGY CONSULT NOTE  Patient ID: Cambreigh Dearing MRN: [id number], DOB/AGE: 71-Oct-1942   Admit date: 10/25/2012 Date of Consult: 10/26/2012  Primary Physician: Michele Mcalpine, MD Primary Cardiologist: [name], MD Requesting Physician: Brien Few, MD Reason for Consultation: Syncope  History of Present Illness Ms. Hickling is a pleasant 72 year old woman with CAD, preserved LV function, chronic AF, COPD, chronic anemia and CKD who has been admitted with recurrent syncope. Ms. Surprenant is accompanied by her husband who assists with history questions and has witnessed these episodes. She has had about 6-7 episodes in the last 6 months. She states, "Usually I have enough time to sit down" but reports 2 of them occurred abruptly resulting in falls. All of the episodes have occurred with positional/postural changes or while standing/walking. None have occurred while seated or lying down. Her most recent episodes occurred after she stood up from the couch to walk to another room in her home. She states by the time she gets in the other room she feels lightheaded and will sit down. She denies CP, SOB or palpitations with any of the episodes. She denies nausea or diaphoresis but her husband reports she is very pale. Her episodes last about 1-2 minutes and she recovers without any cognitive dysfunction or difficult mentation. She has no history of seizures or CVA. She has not sustained any significant injuries.   Past Medical History Past Medical History  Diagnosis Date  . Coronary atherosclerosis of unspecified type of vessel, native or graft   . Atrial fibrillation 01/19/2012    stress test - non-gated secondary to A fib,  normal myocaridal perfusion study  . Unspecified venous (peripheral) insufficiency   . Other and unspecified hyperlipidemia   . Morbid obesity   . Esophageal reflux   . Irritable bowel syndrome   . Other symptoms involving urinary system   . Chronic pain syndrome   .  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 Anxiety state, unspecified   . Herpes zoster without mention of complication   . Herpes zoster with other nervous system complications   . Lumbago   . Anticoagulated on Coumadin, chronic for A. Fib 04/08/2012  . COPD (chronic obstructive pulmonary disease)   . 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 Pneumonia 4 years ago  . Bronchitis, chronic   . Fibromyalgia   . Arthritis   . CAD (coronary artery disease) 04/29/2006    echo -EF 45-50%, impaired LV relaxation  . 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 GI bleed 04/07/2012    endoscopy/colonoscopy unremarkable  . 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 DJD (degenerative joint disease)     Past Surgical History Past Surgical History  Procedure Laterality Date  . Vesicovaginal fistula closure w/ tah    . Cardiac catheterization  01/06/2010    RCA mid artery stenosis at 99%, LAD proximal occlusion 90%  at origin of ramus  . Nissen fundoplication    . Esophagogastroduodenoscopy  04/09/2012    Procedure: ESOPHAGOGASTRODUODENOSCOPY (EGD);  Surgeon: Theda Belfast, MD;  Location: [facility] ENDOSCOPY;  Service: Endoscopy;  Laterality: N/A;  tentatively scheduled per Blase Mess due to patients breathing problems  . Colonoscopy  04/11/2012    Procedure: COLONOSCOPY;  Surgeon: Meryl Dare, MD,FACG;  Location: [facility] ENDOSCOPY;  Service: Endoscopy;  Laterality: N/A;  . Abdominal hysterectomy  at 72 years old  . Appendectomy  about 50 years ago  .  Coronary artery bypass graft  1997    LIMA to LAD, SVG to ramus, SVG to RCA  . Cholecystectomy  50 years ago  . Neck surgery  72 years old    full movement  . Right knee arthroscopy  3 years ago  . Eye surgery  2005    cataracts both eyes  . Back surgery      x5, "birdcage" and fused in lower back  . Total knee arthroplasty  06/28/2012    Procedure: TOTAL KNEE ARTHROPLASTY;  Surgeon:  Eugenia Mcalpine, MD;  Location: [facility] ORS;  Service: Orthopedics;  Laterality: Left;  . Joint replacement Left 06/28/12    knee replacement     Allergies/Intolerances No Known Allergies  Inpatient Medications . ALPRAZolam  0.5 mg Oral QID  . amitriptyline  50 mg Oral QHS  . aspirin EC  81 mg Oral QPM  . calcium-vitamin D  1 tablet Oral BID WC  . docusate sodium  200 mg Oral QHS  . ferrous sulfate  325 mg Oral QPM  . furosemide  40 mg Oral BID  . gabapentin  600 mg Oral TID AC & HS  . guaiFENesin  1,200 mg Oral BID  . isosorbide mononitrate  30 mg Oral QHS  . metoprolol  50 mg Oral BID  . multivitamin with minerals  1 tablet Oral Daily  . pantoprazole  40 mg Oral BID AC  . potassium chloride SA  20 mEq Oral BID  . simvastatin  20 mg Oral QHS  . warfarin  2.5 mg Oral ONCE-1800  . Warfarin - Pharmacist Dosing Inpatient   Does not apply q1800    Family History Family History  Problem Relation Age of Onset  . Heart disease Mother   . Lung disease Father   . Kidney disease       Social History Social History  . Marital Status: Married   Social History Main Topics  . Smoking status: Former Smoker -- 1.00 packs/day for 22 years    Types: Cigarettes    Quit date: 07/20/1984  . Smokeless tobacco: Never Used  . Alcohol Use: No  . Drug Use: No   Review of Systems General: No chills, fever, night sweats or weight changes  Cardiovascular:  No chest pain, dyspnea on exertion, edema, orthopnea, palpitations, paroxysmal nocturnal dyspnea Dermatological: No rash, lesions or masses Respiratory: No cough, dyspnea Urologic: No hematuria, dysuria Abdominal: No nausea, vomiting, diarrhea, bright red blood per rectum, melena, or hematemesis Neurologic: No visual changes, weakness, changes in mental status All other systems reviewed and are otherwise negative except as noted above.  Physical Exam Blood pressure 130/90, pulse 103, temperature 97.7 F (36.5 C), temperature source Oral,  resp. rate 20, height 5\' 8"  (1.727 m), weight 256 lb (116.121 kg), SpO2 92.00%.  General: Well developed, well appearing, overweight 72 year old female in no acute distress. HEENT: Normocephalic, atraumatic. EOMs intact. Sclera nonicteric. Oropharynx clear.  Neck: Supple. No JVD. Lungs: Respirations regular and unlabored, CTA bilaterally. No wheezes, rales or rhonchi. Heart: Irregular. S1, S2 present. No murmurs, rub, S3 or S4. Abdomen: Soft, non-tender, non-distended. BS present x 4 quadrants. No hepatosplenomegaly.  Extremities: No clubbing, cyanosis or edema. DP/PT/Radials 2+ and equal bilaterally. Psych: Normal affect. Neuro: Alert and oriented X 3. Moves all extremities spontaneously. Musculoskeletal: No kyphosis. Skin: Intact. Warm and dry. No rashes or petechiae in exposed areas.   Labs  Recent Labs  10/25/12 2331 10/26/12 0547  TROPONINI <0.30 <0.30   Lab  Results  Component Value Date   WBC 4.9 10/26/2012   HGB 9.8* 10/26/2012   HCT 30.5* 10/26/2012   MCV 91.3 10/26/2012   PLT 111* 10/26/2012    Recent Labs Lab 10/25/12 1919 10/26/12 0547  NA 136 137  K 4.4 4.0  CL 96 98  CO2 32 30  BUN 27* 28*  CREATININE 1.56* 1.48*  CALCIUM 9.2 9.0  PROT 7.8  --   BILITOT 0.5  --   ALKPHOS 123*  --   ALT 9  --   AST 22  --   GLUCOSE 107* 89   No results found for this basename: TSH, T4TOTAL, FREET3, T3FREE, THYROIDAB,  in the last 72 hours   Recent Labs  10/26/12 0547  INR 1.56*    Radiology/Studies Mr Brain Wo Contrast  10/26/2012  *RADIOLOGY REPORT*  Clinical Data: Multiple syncopal episodes over the last 90 days.  MRI HEAD WITHOUT CONTRAST  Technique:  Multiplanar, multiecho pulse sequences of the brain and surrounding structures were obtained according to standard protocol without intravenous contrast.  Comparison: CT 10/18/2012  Findings: Diffusion imaging does not show any acute or subacute infarction.  There are chronic small vessel changes affecting the pons.  There  are old small vessel cerebellar infarctions.  There are mild chronic small vessel changes of the cerebral hemispheric white matter.  No cortical or large vessel territory infarction. No mass lesion, hemorrhage, hydrocephalus or extra-axial collection.  No pituitary mass.  No inflammatory sinus disease.  No skull or skull base lesion.  Major vessels at the base of the brain show flow.  IMPRESSION: No acute finding.  Atrophy and chronic small vessel disease.   Original Report Authenticated By: Paulina Fusi, M.D.     Echocardiogram pending  12-lead ECG shows rate controlled AF Telemetry shows rate controlled AF with rare PVCs; no ventricular arrhythmias   Assessment and Plan 1. Recurrent syncope 2. CAD 3. Chronic AF 4. COPD 5. CKD 6. Chronic anemia The etiology of Ms. Castro's syncope is unclear; however, arrhythmogenic syncope seems less likely. Her history suggests orthostasis given that nearly all of her episodes have occurred with positional/postural changes and prodrome. We agree with checking orthostatics now. She has history of normal LV function and we agree with updating her echo while here. We will defer any further work-up for coronary ischemia to her primary cardiogist, Dr. Allyson Sabal, who has been contacted as well by Dr. Brien Few. Outpatient event monitor would also be reasonable. No driving for 6 months following last episode of syncope.  Dr. Ladona Ridgel to see Signed, Rick Duff, PA-C 10/26/2012, 11:39 AM  EP Attending  Patient seen and examined. Agree with above. She has recurrent syncope, almost certainly due to orthostasis. He advanced age and other comorbidities including CAD and atrial fib raise the possibility of bradycardia. Agree with Dr. Michel Harrow recommendation for outpatient monitor. To treat her orthostasis, she will need to be off of her chronic nitrates and stop or at least reduce her dose of lasix. Would keep on the hypertensive side and avoid dehydration and quick changes  in posture.  Leonia Reeves.D.

## 2012-10-26 NOTE — Progress Notes (Signed)
Pt complained of chest pain, paged physician and obtained order for stat EKG , Maalox solution and Nitroglycerin PRN. The pain basically decreased on its own. However Pt received 30cc of Maalox PO, vital signs are stables. Will continue to monitor.

## 2012-10-26 NOTE — Progress Notes (Signed)
*  PRELIMINARY RESULTS* Vascular Ultrasound Carotid Duplex (Doppler) has been completed.  Preliminary findings: Right= <40% ICA stenosis. Left = 40-59% ICA stenosis.   Vertebral artery flow is antegrade.      Farrel Demark, RDMS, RVT  10/26/2012, 10:33 AM

## 2012-10-26 NOTE — Progress Notes (Signed)
  Echocardiogram 2D Echocardiogram has been performed.  [name] 10/26/2012, 1:21 PM

## 2012-10-26 NOTE — Progress Notes (Signed)
Orthopedic Tech Progress Note Patient Details:  [name] Apr 19, 1941 [id number]  Ortho Devices Type of Ortho Device: Velcro wrist splint Ortho Device/Splint Interventions: Ordered;Application   Jennye Moccasin 10/26/2012, 5:22 PM

## 2012-10-26 NOTE — Progress Notes (Signed)
Pt complaining of left knee and foot pain this AM. Pt does have a history of a total left knee replacement. Vital signs stable. Two 5-325 mg tablets of  Percocet given for pain. Will continue to monitor patient.

## 2012-10-26 NOTE — Progress Notes (Signed)
ANTICOAGULATION CONSULT NOTE - Initial Consult  Pharmacy Consult for Coumadin Indication: atrial fibrillation  No Known Allergies  Patient Measurements:   Vital Signs: Temp: 98.4 F (36.9 C) (04/08 2220) Temp src: Oral (04/08 1907) BP: 111/91 mmHg (04/09 0031) Pulse Rate: 105 (04/09 0031)  Labs:  Recent Labs  10/25/12 1919 10/25/12 2217 10/25/12 2331  HGB 10.5*  --   --   HCT 32.8*  --   --   PLT 122*  --   --   LABPROT  --  17.4*  --   INR  --  1.47  --   CREATININE 1.56*  --   --   TROPONINI  --   --  <0.30    The CrCl is unknown because both a height and weight (above a minimum accepted value) are required for this calculation.   Medical History: Past Medical History  Diagnosis Date  . Coronary atherosclerosis of unspecified type of vessel, native or graft   . Atrial fibrillation 01/19/2012    stress test - non-gated secondary to A fib,  normal myocaridal perfusion study  . Unspecified venous (peripheral) insufficiency   . Other and unspecified hyperlipidemia   . Morbid obesity   . Esophageal reflux   . Irritable bowel syndrome   . Other symptoms involving urinary system   . Chronic pain syndrome   . 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 Anxiety state, unspecified   . Herpes zoster without mention of complication   . Herpes zoster with other nervous system complications   . Lumbago   . Anticoagulated on Coumadin, chronic for A. Fib 04/08/2012  . COPD (chronic obstructive pulmonary disease)   . 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 Pneumonia 4 years ago  . Bronchitis, chronic   . Fibromyalgia   . Arthritis   . CAD (coronary artery disease) 04/29/2006    echo -EF 45-50%, impaired LV relaxation  . 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 GI bleed 04/07/2012    endoscopy/colonoscopy unremarkable  . 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 DJD (degenerative joint disease)      Medications:  Albuterol  Xanax  Elavil  ASA  Oscal-D  Keflex  Colace  Iron  Lasix  Neurontin  Mucinex  Imdur  Lopressor  MVI  Gas-X  Zocor  Vit B12  Coumadin 5 mg TTSS 2.5 mg MWF  Assessment: 72 yo female admitted with syncope/LOC, h/o Afib, to continue anticoagulation  Goal of Therapy:  INR 2-3 Monitor platelets by anticoagulation protocol: Yes   Plan:  Coumadin 2.5 mg po tonight (in addition to 5 mg taken earlie today at home) Daily INR  Norah Fick, Gary Fleet 10/26/2012,12:59 AM

## 2012-10-26 NOTE — Progress Notes (Signed)
Utilization review completed. Amaiah Marieclaire Bettenhausen, RN, BSN. 

## 2012-10-26 NOTE — Progress Notes (Signed)
[name] PROGRESS NOTE  [name] BJY:[id number] DOB: [birth year] DOA: 10/25/2012 PCP: Michele Mcalpine, MD  Assessment/Plan: Principal Problem:   Syncope and collapse Active Problems:   HYPERLIPIDEMIA   Morbid obesity   LOW BACK PAIN, CHRONIC   CAD (coronary artery disease), with CABG in 1997 and negative Nuc. 01/2012   Anticoagulated on Coumadin, chronic for A. Fib   Atrial fibrillation   DJD (degenerative joint disease)   COPD (chronic obstructive pulmonary disease)   Anemia, chronic disease   CKD (chronic kidney disease), stage III    1. Syncope: Etiology unclear. Patient has had recurrent episodes of syncope, without prodrome, raising suspicion for a cardiogenic etiology. Last episode was on 10/18/12. Crdiac enzymes are unelevated. Monitoring telemetrically, and no arrhythmias are evident so far. Brain MRI, carotid dopplers and 2-D echocardiogram are pending. Checing orthostatics. Patient has never had event monitor in the past. Will consult EPS.  2. A. Fib:  Rate controlled. INR subtherapeutic, Coumadin dosing per pharmacy. 3. Hypertension:  Controlled on pre-admission antihypertensives.  4. Chronic kidney disease stage III: Baseline creatinine is 1.43-1.7 (06/2012-09/2012). Creatinine is 1.48 today, ie, stable and at baseline. Following renal indices.  5. Anemia: Mild-moderate, normocytic, likely due to chronic disease, although patient does have a recent history of GI bleed status post blood transfusion. Following CBC.  6. COPD: Stable/Asymptomatic.  7. Coronary artery disease status post CABG: No clinical evidence of ACS. Continue aspirin and metoprolol. Stable.  8. Recent history of UTI: Completed course of Keflex on 10/25/12. Urinalysis is negative. Reassured accordingly.  9. Degenerative joint disease/chronic low back pain: Stable/Not problematic. Marland Kitchen  10. Hyperlipidemia:  Continued on Statin.    Code Status: Full Code.  Family Communication:  Disposition Plan: To  be determined.    Brief narrative: 72 y.o. Caucasian female with history of coronary artery disease status post CABG, atrial fibrillation on Coumadin for chronic anticoagulation, COPD, chronic kidney disease stage III, history of GI bleed in September of 2013 with negative EGD and colonoscopy, and chronic low back pain. Patient reports that over the last month to month and a half she has had intermittent episodes of loss of consciousness which happens suddenly. Her last episode was about a week ago on 10/18/2012, she had a head CT which was negative. In evening of 10/25/12, she had another episode of loss of consciousness while she was walking to the bathroom. She denied any antecedent dizziness, lightheadedness, chest pain, or shortness of breath. She is not sure how long she was on the floor, but she woke up feeling pain from multiple contusions she sustained. She did not lose control of her bowel or bladder. She denies any recent fevers, chills, nausea, vomiting, chest pain, shortness of breath, abdominal pain, diarrhea, headaches or vision changes. Admitted for further management.    Consultants:  N/A.   Procedures:  X-Ray right foot.   Antibiotics:  N/A.   HPI/Subjective: Asymptomatic, apart from right wrist, left knee and right foot pain.   Objective: Vital signs in last 24 hours: Temp:  [97.7 F (36.5 C)-98.4 F (36.9 C)] 97.7 F (36.5 C) (04/09 0500) Pulse Rate:  [78-105] 96 (04/09 0500) Resp:  [17-22] 20 (04/09 0500) BP: (108-143)/(49-91) 138/83 mmHg (04/09 0500) SpO2:  [92 %-96 %] 92 % (04/09 0500) Weight:  [116.121 kg (256 lb)] 116.121 kg (256 lb) (04/09 0058) Weight change:  Last BM Date: 10/25/12  Intake/Output from previous day:       Physical Exam: General: Comfortable, alert, communicative, fully oriented,  not short of breath at rest.  HEENT:  Mild clinical pallor, no jaundice, no conjunctival injection or discharge. NECK:  Supple, JVP not seen, no carotid  bruits, no palpable lymphadenopathy, no palpable goiter. CHEST:  Clinically clear to auscultation, no wheezes, no crackles. Median sternotomy scar noted.  HEART:  Sounds 1 and 2 heard, normal, regular, no murmurs. ABDOMEN:  Obese, soft, no scars, non-tender, no palpable organomegaly, no palpable masses, normal bowel sounds. GENITALIA:  Not examined. . LOWER EXTREMITIES:  No pitting edema, palpable peripheral pulses. MUSCULOSKELETAL SYSTEM:  Fading bruise right wrist, no deformity, although tender, abrasion right upper shin, under dressing. Generalized osteoarthritic changes, otherwise, normal. CENTRAL NERVOUS SYSTEM:  No focal neurologic deficit on gross examination.  Lab Results:  Recent Labs  10/25/12 1919 10/26/12 0547  WBC 4.4 4.9  HGB 10.5* 9.8*  HCT 32.8* 30.5*  PLT 122* 111*    Recent Labs  10/25/12 1919 10/26/12 0547  NA 136 137  K 4.4 4.0  CL 96 98  CO2 32 30  GLUCOSE 107* 89  BUN 27* 28*  CREATININE 1.56* 1.48*  CALCIUM 9.2 9.0   Recent Results (from the past 240 hour(s))  URINE CULTURE     Status: None   Collection Time    10/18/12  9:39 PM      Result Value Range Status   Specimen Description URINE, CLEAN CATCH   Final   Special Requests NONE   Final   Culture  Setup Time 10/19/2012 03:06   Final   Colony Count NO GROWTH   Final   Culture NO GROWTH   Final   Report Status 10/20/2012 FINAL   Final  MRSA PCR SCREENING     Status: None   Collection Time    10/26/12  2:11 AM      Result Value Range Status   MRSA by PCR NEGATIVE  NEGATIVE Final   Comment:            The GeneXpert MRSA Assay (FDA     approved for NASAL specimens     only), is one component of a     comprehensive MRSA colonization     surveillance program. It is not     intended to diagnose MRSA     infection nor to guide or     monitor treatment for     MRSA infections.     Studies/Results: Dg Foot Complete Right  10/25/2012  *RADIOLOGY REPORT*  Clinical Data: Fall, bruising, pain  .  RIGHT FOOT COMPLETE - 3+ VIEW  Comparison: None.  Findings: bones are osteopenic.  Minor degenerative changes of the interphalangeal joints.  Normal alignment without fracture evident  IMPRESSION: No acute osseous finding   Original Report Authenticated By: [name]. Miles Costain, M.D.     Medications: Scheduled Meds: . ALPRAZolam  0.5 mg Oral QID  . amitriptyline  50 mg Oral QHS  . aspirin EC  81 mg Oral QPM  . calcium-vitamin D  1 tablet Oral BID WC  . cephALEXin  500 mg Oral QID  . docusate sodium  200 mg Oral QHS  . ferrous sulfate  325 mg Oral QPM  . furosemide  40 mg Oral BID  . gabapentin  600 mg Oral TID AC & HS  . guaiFENesin  1,200 mg Oral BID  . isosorbide mononitrate  30 mg Oral QHS  . metoprolol  50 mg Oral BID  . multivitamin with minerals  1 tablet Oral Daily  . pantoprazole  40  mg Oral BID AC  . potassium chloride SA  20 mEq Oral BID  . simvastatin  20 mg Oral QHS  . warfarin  2.5 mg Oral ONCE-1800  . Warfarin - Pharmacist Dosing Inpatient   Does not apply q1800   Continuous Infusions:  PRN Meds:.acetaminophen, acetaminophen, albuterol, ondansetron (ZOFRAN) IV, ondansetron, oxyCODONE-acetaminophen, promethazine    LOS: 1 day   Terence Bart,CHRISTOPHER  [name] Pager [phone]. If 8PM-8AM, please contact night-coverage at [email or web address], password [facility] 10/26/2012, 8:41 AM  LOS: 1 day

## 2012-10-27 ENCOUNTER — Ambulatory Visit (HOSPITAL_COMMUNITY): Admission: RE | Admit: 2012-10-27 | Payer: Medicare Other | Source: Ambulatory Visit

## 2012-10-27 DIAGNOSIS — Z7901 Long term (current) use of anticoagulants: Secondary | ICD-10-CM

## 2012-10-27 DIAGNOSIS — D638 Anemia in other chronic diseases classified elsewhere: Secondary | ICD-10-CM

## 2012-10-27 DIAGNOSIS — Z5181 Encounter for therapeutic drug level monitoring: Secondary | ICD-10-CM

## 2012-10-27 DIAGNOSIS — I4891 Unspecified atrial fibrillation: Secondary | ICD-10-CM

## 2012-10-27 LAB — TROPONIN I
Troponin I: <0.3 ng/mL (ref <0.30)
Troponin I: <0.3 ng/mL (ref <0.30)

## 2012-10-27 MED ORDER — WARFARIN SODIUM 6 MG PO TABS
6.0000 mg | ORAL_TABLET | Freq: Once | ORAL | Status: AC
  Administered 2012-10-27: 6 mg via ORAL
  Filled 2012-10-27: qty 1

## 2012-10-27 NOTE — Progress Notes (Signed)
Occupational Therapy Discharge Patient Details Name: [name] MRN: [id number] DOB: 07-04-1941 Today's Date: 10/27/2012 Time: 7829-5621 OT Time Calculation (min): 14 min  Patient discharged from OT services secondary to goals met and no further OT needs identified.  Please see latest therapy progress note for current level of functioning and progress toward goals.    Progress and discharge plan discussed with patient and/or caregiver: Patient/Caregiver agrees with plan  GO     Lucile Shutters Pager: [phone]  10/27/2012, 3:04 PM

## 2012-10-27 NOTE — Progress Notes (Signed)
ANTICOAGULATION CONSULT NOTE - Follow Up Consult  Pharmacy Consult for Coumadin Indication: atrial fibrillation  No Known Allergies  Patient Measurements: Height: 5\' 8"  (172.7 cm) Weight: 248 lb (112.492 kg) IBW/kg (Calculated) : 63.9 Heparin Dosing Weight:    Vital Signs: Temp: 98.2 F (36.8 C) (04/10 0505) Temp src: Oral (04/10 0505) BP: 115/93 mmHg (04/10 0505) Pulse Rate: 74 (04/10 0505)  Labs:  Recent Labs  10/25/12 1919 10/25/12 2217  10/26/12 0547 10/26/12 1110 10/26/12 1942 10/27/12 0130  HGB 10.5*  --   --  9.8*  --   --   --   HCT 32.8*  --   --  30.5*  --   --   --   PLT 122*  --   --  111*  --   --   --   LABPROT  --  17.4*  --  18.2*  --   --  18.2*  INR  --  1.47  --  1.56*  --   --  1.56*  CREATININE 1.56*  --   --  1.48*  --   --   --   TROPONINI  --   --   < > <0.30 <0.30 <0.30 <0.30  < > = values in this interval not displayed.  Estimated Creatinine Clearance: 45.8 ml/min (by C-G formula based on Cr of 1.48).   Medications:  Prescriptions prior to admission  Medication Sig Dispense Refill  . albuterol (PROVENTIL) (2.5 MG/3ML) 0.083% nebulizer solution Take 2.5 mg by nebulization 3 (three) times daily as needed for wheezing or shortness of breath.      . ALPRAZolam (XANAX) 0.5 MG tablet Take 0.5 mg by mouth 4 (four) times daily.      Marland Kitchen amitriptyline (ELAVIL) 50 MG tablet Take 50 mg by mouth at bedtime.      Marland Kitchen aspirin EC 81 MG tablet Take 81 mg by mouth every evening.       . Calcium Carbonate-Vitamin D (CALCIUM-VITAMIN D) 500-200 MG-UNIT per tablet Take 1 tablet by mouth 2 (two) times daily with a meal.      . cephALEXin (KEFLEX) 500 MG capsule Take 1 capsule (500 mg total) by mouth 4 (four) times daily.  28 capsule  0  . docusate sodium (COLACE) 100 MG capsule Take 200 mg by mouth at bedtime.      . ferrous sulfate 325 (65 FE) MG tablet Take 325 mg by mouth every evening.      . furosemide (LASIX) 40 MG tablet Take 40 mg by mouth 2 (two) times  daily.       Marland Kitchen gabapentin (NEURONTIN) 600 MG tablet Take 600 mg by mouth 4 (four) times daily.      Marland Kitchen guaiFENesin (MUCINEX) 600 MG 12 hr tablet Take 1,200 mg by mouth 2 (two) times daily.      . hydrocortisone cream 1 % Apply 1 application topically 2 (two) times daily.      . isosorbide mononitrate (IMDUR) 30 MG 24 hr tablet Take 30 mg by mouth at bedtime.       . metoprolol (LOPRESSOR) 50 MG tablet Take 50 mg by mouth 2 (two) times daily.      . Multiple Vitamin (MULTIVITAMIN WITH MINERALS) TABS Take 1 tablet by mouth daily.      Marland Kitchen omeprazole (PRILOSEC) 20 MG capsule Take 20 mg by mouth 2 (two) times daily.      Marland Kitchen oxyCODONE-acetaminophen (PERCOCET/ROXICET) 5-325 MG per tablet Take 2 tablets by mouth every  4 (four) hours as needed for pain.      . potassium chloride SA (K-DUR,KLOR-CON) 20 MEQ tablet Take 20 mEq by mouth 2 (two) times daily.      . promethazine (PHENERGAN) 25 MG tablet Take 25 mg by mouth every 6 (six) hours as needed for nausea.      . Simethicone (GAS-X PO) Take 1 tablet by mouth daily as needed (gas).      . simvastatin (ZOCOR) 20 MG tablet Take 20 mg by mouth at bedtime.       Marland Kitchen warfarin (COUMADIN) 5 MG tablet Take 2.5-5 mg by mouth daily. 5mg  Sunday, Tuesday, Thursday, and Saturday   2.5mg  on monday,wednesday,friday      . vitamin B-12 (CYANOCOBALAMIN) 1000 MCG tablet Take 1,000 mcg by mouth daily.         Assessment: Syncope  Anticoagulation: h/o afib. Coumadin 2.5 mg MWF 5 mg TTSS for Afib. INR 1.56 again this am. Hgb 10.5. Plts 122 with known thrombocytopenia. GIB noted 9/13. CHADS2=0.  Infectious Disease: Afebrile. WBC 4.9. Keflex ended 4/9 for UTI.  Cardiovascular CAD s/p CABG, s/p PCI, Afib, HLD, BP 115/93, HR 74. EF 55-65%. Meds: ASA 81mg , po Lasix, Imdur, Metoprolol, K+.   Gastrointestinal / Nutrition IBS on PPI, MV, Ca+D,  Neurology: Scheduled Xanax, Elavil. Neurontin. Recurrent syncope of unknown etiology  Nephrology CRI with Scr 1.49 (CrCl  45)  Pulmonary COPD  Hematology / Oncology: FESO4 for anemia  PTA Medication Issues: Zocor, B12 tab.  Goal of Therapy:  INR 2-3 Monitor platelets by anticoagulation protocol: Yes   Plan:  Coumadin 6mg  po x 1 tonight.  Merilynn Finland, [employer] 10/27/2012,10:36 AM

## 2012-10-27 NOTE — Progress Notes (Signed)
[name] PROGRESS NOTE  [name] RUE:[id number] DOB: 10/07/1940 DOA: 10/25/2012 PCP: Michele Mcalpine, MD  Assessment/Plan: Principal Problem:   Syncope and collapse Active Problems:   HYPERLIPIDEMIA   Morbid obesity   LOW BACK PAIN, CHRONIC   CAD (coronary artery disease), with CABG in 1997 and negative Nuc. 01/2012   Anticoagulated on Coumadin, chronic for A. Fib   atrial fib, permanent on coumadin for anticoagulation   DJD (degenerative joint disease)   COPD (chronic obstructive pulmonary disease)   Anemia, chronic disease   CKD (chronic kidney disease), stage III    1. Syncope: Etiology unclear. Patient has had recurrent episodes of syncope, without prodrome, raising suspicion for a cardiogenic etiology. Last episode was on 10/18/12. Cardiac enzymes are unelevated. Monitored telemetrically, and no concerning arrhythmias were evident. Brain MRI, was negative for acute findings, carotid dopplers showed no significant extracranial carotid artery stenosis. Vertebrals are patent with antegrade flow. ICA/CCA ratio: right = 1.32. left = 1.42. 2-D echocardiogram showed EF of 55% to 65%, with beat to beat variation. LV diastolic function could not be assessed due to atrial fibrillation. There was mild mitral regurgitation, left atrium was moderately dilated and right atrium was everely dilated (31.6 cm2). Thre was moderate tricuspid regurgitation. Dr Sharrell Ku provided EPS consultation, and has opined that arrhythmogenic syncope seems less likely. Her history suggests orthostasis given that nearly all of her episodes have occurred with positional/postural changes and prodrome. She had no recurrences during he hospitalization, was evaluated by PT/OT and no acute needs identified.  2. A. Fib:  Rate controlled. INR subtherapeutic, Coumadin dosing per pharmacy. 3. Hypertension:  Controlled on pre-admission antihypertensives.  4. Chronic kidney disease stage III: Baseline creatinine is  1.43-1.7 (06/2012-09/2012). Creatinine is 1.48 today, ie, stable and at baseline. Following renal indices.  5. Anemia: Mild-moderate, normocytic, likely due to chronic disease, although patient does have a recent history of GI bleed status post blood transfusion. Following CBC.  6. COPD: Stable/Asymptomatic.  7. Coronary artery disease status post CABG: No clinical evidence of ACS. Continue aspirin and metoprolol. Stable. Cardiac enzymes remained unelevated. She did have a brief episode of lower chest/epigastric discomfort in evening of 10/26/12, which resolved with Maalox. No recurrences. Dr Rennis Golden provided cardiology consultation, and concurs that this was likely of GI etiology.  8. Recent history of UTI: Completed course of Keflex on 10/25/12. Urinalysis is negative. Reassured accordingly.  9. Degenerative joint disease/chronic low back pain: Stable/Not problematic. Marland Kitchen  10. Hyperlipidemia:  Continued on Statin.    Code Status: Full Code.  Family Communication:  Disposition Plan: To be determined.    Brief narrative: 72 y.o. Caucasian female with history of coronary artery disease status post CABG, atrial fibrillation on Coumadin for chronic anticoagulation, COPD, chronic kidney disease stage III, history of GI bleed in September of 2013 with negative EGD and colonoscopy, and chronic low back pain. Patient reports that over the last month to month and a half she has had intermittent episodes of loss of consciousness which happens suddenly. Her last episode was about a week ago on 10/18/2012, she had a head CT which was negative. In evening of 10/25/12, she had another episode of loss of consciousness while she was walking to the bathroom. She denied any antecedent dizziness, lightheadedness, chest pain, or shortness of breath. She is not sure how long she was on the floor, but she woke up feeling pain from multiple contusions she sustained. She did not lose control of her bowel or bladder. She  denies any  recent fevers, chills, nausea, vomiting, chest pain, shortness of breath, abdominal pain, diarrhea, headaches or vision changes. Admitted for further management.    Consultants:  N/A.   Procedures:  X-Ray right foot.   Brain MRI.   Carotid dopplers.  2D echocardiogram.   Antibiotics:  N/A.   HPI/Subjective: Asymptomatic.   Objective: Vital signs in last 24 hours: Temp:  [97.3 F (36.3 C)-98.2 F (36.8 C)] 97.3 F (36.3 C) (04/10 1415) Pulse Rate:  [74-89] 82 (04/10 1415) Resp:  [18] 18 (04/09 2019) BP: (115-122)/(72-93) 117/84 mmHg (04/10 1415) SpO2:  [93 %-97 %] 93 % (04/10 1842) Weight:  [112.492 kg (248 lb)] 112.492 kg (248 lb) (04/10 0505) Weight change: -3.629 kg (-8 lb) Last BM Date: 10/25/12  Intake/Output from previous day: 04/09 0701 - 04/10 0700 In: 240 [P.O.:240] Out: 300 [Urine:300]     Physical Exam: General: Comfortable, alert, communicative, fully oriented, not short of breath at rest.  HEENT:  Mild clinical pallor, no jaundice, no conjunctival injection or discharge. NECK:  Supple, JVP not seen, no carotid bruits, no palpable lymphadenopathy, no palpable goiter. CHEST:  Clinically clear to auscultation, no wheezes, no crackles. Median sternotomy scar noted.  HEART:  Sounds 1 and 2 heard, normal, irregular, no murmurs. ABDOMEN:  Obese, soft, no scars, non-tender, no palpable organomegaly, no palpable masses, normal bowel sounds. GENITALIA:  Not examined. . LOWER EXTREMITIES:  No pitting edema, palpable peripheral pulses. MUSCULOSKELETAL SYSTEM:  Fading bruise right wrist, no deformity, although tender, abrasion right upper shin, under dressing. Generalized osteoarthritic changes, otherwise, normal. CENTRAL NERVOUS SYSTEM:  No focal neurologic deficit on gross examination.  Lab Results:  Recent Labs  10/25/12 1919 10/26/12 0547  WBC 4.4 4.9  HGB 10.5* 9.8*  HCT 32.8* 30.5*  PLT 122* 111*    Recent Labs  10/25/12 1919 10/26/12 0547   NA 136 137  K 4.4 4.0  CL 96 98  CO2 32 30  GLUCOSE 107* 89  BUN 27* 28*  CREATININE 1.56* 1.48*  CALCIUM 9.2 9.0   Recent Results (from the past 240 hour(s))  URINE CULTURE     Status: None   Collection Time    10/18/12  9:39 PM      Result Value Range Status   Specimen Description URINE, CLEAN CATCH   Final   Special Requests NONE   Final   Culture  Setup Time 10/19/2012 03:06   Final   Colony Count NO GROWTH   Final   Culture NO GROWTH   Final   Report Status 10/20/2012 FINAL   Final  MRSA PCR SCREENING     Status: None   Collection Time    10/26/12  2:11 AM      Result Value Range Status   MRSA by PCR NEGATIVE  NEGATIVE Final   Comment:            The GeneXpert MRSA Assay (FDA     approved for NASAL specimens     only), is one component of a     comprehensive MRSA colonization     surveillance program. It is not     intended to diagnose MRSA     infection nor to guide or     monitor treatment for     MRSA infections.     Studies/Results: Mr Sherrin Daisy Contrast  10/26/2012  *RADIOLOGY REPORT*  Clinical Data: Multiple syncopal episodes over the last 90 days.  MRI HEAD WITHOUT CONTRAST  Technique:  Multiplanar, multiecho pulse sequences of the brain and surrounding structures were obtained according to standard protocol without intravenous contrast.  Comparison: CT 10/18/2012  Findings: Diffusion imaging does not show any acute or subacute infarction.  There are chronic small vessel changes affecting the pons.  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  There are mild chronic small vessel changes of the cerebral hemispheric white matter.  No cortical or large vessel territory infarction. No mass lesion, hemorrhage, hydrocephalus or extra-axial collection.  No pituitary mass.  No inflammatory sinus disease.  No skull or skull base lesion.  Major vessels at the base of the brain show flow.  IMPRESSION: No acute finding.  Atrophy and chronic small vessel disease.    Original Report Authenticated By: Paulina Fusi, M.D.    Dg Foot Complete Right  10/25/2012  *RADIOLOGY REPORT*  Clinical Data: Fall, bruising, pain .  RIGHT FOOT COMPLETE - 3+ VIEW  Comparison: None.  Findings: bones are osteopenic.  Minor degenerative changes of the interphalangeal joints.  Normal alignment without fracture evident  IMPRESSION: No acute osseous finding   Original Report Authenticated By: [name]. Miles Costain, M.D.     Medications: Scheduled Meds: . ALPRAZolam  0.5 mg Oral QID  . amitriptyline  50 mg Oral QHS  . aspirin EC  81 mg Oral QPM  . calcium-vitamin D  1 tablet Oral BID WC  . docusate sodium  200 mg Oral QHS  . ferrous sulfate  325 mg Oral QPM  . furosemide  40 mg Oral BID  . gabapentin  600 mg Oral TID AC & HS  . guaiFENesin  1,200 mg Oral BID  . isosorbide mononitrate  30 mg Oral QHS  . metoprolol  50 mg Oral BID  . multivitamin with minerals  1 tablet Oral Daily  . pantoprazole  40 mg Oral BID AC  . potassium chloride SA  20 mEq Oral BID  . simvastatin  20 mg Oral QHS  . Warfarin - Pharmacist Dosing Inpatient   Does not apply q1800   Continuous Infusions:  PRN Meds:.acetaminophen, acetaminophen, albuterol, alum & mag hydroxide-simeth, nitroGLYCERIN, ondansetron (ZOFRAN) IV, ondansetron, oxyCODONE-acetaminophen, promethazine    LOS: 2 days   Nayan Proch,CHRISTOPHER  [name] Pager [phone]. If 8PM-8AM, please contact night-coverage at [email or web address], password [facility] 10/27/2012, 7:45 PM  LOS: 2 days

## 2012-10-27 NOTE — Evaluation (Signed)
Physical Therapy Evaluation Patient Details Name: [name] MRN: [id number] DOB: [birth year] Today's Date: 10/27/2012 Time: 4098-1191 PT Time Calculation (min): 25 min  PT Assessment / Plan / Recommendation Clinical Impression  pt admitted with syncope.  Due to inability to determine the cause of the episodes,, pt will wear a monitor for the month.  Otherwise pt is at baseline function with no further PT needs.  Will sign off.    PT Assessment  Patent does not need any further PT services    Follow Up Recommendations  No PT follow up    Does the patient have the potential to tolerate intense rehabilitation      Barriers to Discharge        Equipment Recommendations  None recommended by PT    Recommendations for Other Services     Frequency      Precautions / Restrictions Precautions Precautions: Fall   Pertinent Vitals/Pain      Mobility  Bed Mobility Bed Mobility: Supine to Sit;Sitting - Scoot to Edge of Bed;Sit to Supine Supine to Sit: 7: Independent;HOB elevated Sitting - Scoot to Edge of Bed: 7: Independent Sit to Supine: 7: Independent;HOB flat Transfers Transfers: Sit to Stand;Stand to Sit Sit to Stand: 6: Modified independent (Device/Increase time);With upper extremity assist;From bed Stand to Sit: 6: Modified independent (Device/Increase time);With upper extremity assist;To bed Details for Transfer Assistance: safe mobility Ambulation/Gait Ambulation/Gait Assistance: 6: Modified independent (Device/Increase time) Ambulation Distance (Feet): 250 Feet Assistive device: Straight cane Ambulation/Gait Assistance Details: safe use of the cane.  Steady gait with cane Gait Pattern: Within Functional Limits Gait velocity: slower Stairs: No (deferred by pt)    Exercises     PT Diagnosis:    PT Problem List:   PT Treatment Interventions:     PT Goals    Visit Information  Last PT Received On: 10/27/12 Assistance Needed: +1    Subjective Data  Subjective: I hope I can go home Patient Stated Goal: Independent   Prior Functioning  Home Living Lives With: Spouse Available Help at Discharge: Family Type of Home: House Home Access: Stairs to enter;Ramped entrance Entrance Stairs-Number of Steps: 3 Entrance Stairs-Rails: Can reach both Home Layout: One level Bathroom Shower/Tub: Forensic scientist: Handicapped height Home Adaptive Equipment: Wheelchair - manual;Walker - rolling;Straight cane;Grab bars in shower;Hand-held shower hose Prior Function Level of Independence: Independent Able to Take Stairs?: No Driving: No Vocation: Retired Musician: No difficulties Dominant Hand: Right    Cognition  Cognition Overall Cognitive Status: Appears within functional limits for tasks assessed/performed Arousal/Alertness: Awake/alert Orientation Level: Appears intact for tasks assessed Behavior During Session: [facility] for tasks performed    Extremity/Trunk Assessment Right Upper Extremity Assessment RUE ROM/Strength/Tone: Within functional levels RUE Sensation: WFL - Light Touch RUE Coordination: WFL - gross motor (limited due to splint( wrist cock up prefabricated)) Left Upper Extremity Assessment LUE ROM/Strength/Tone: Within functional levels LUE Sensation: WFL - Light Touch LUE Coordination: WFL - gross/fine motor Right Lower Extremity Assessment RLE ROM/Strength/Tone: Within functional levels Left Lower Extremity Assessment LLE ROM/Strength/Tone: Within functional levels Trunk Assessment Trunk Assessment: Kyphotic   Balance Balance Balance Assessed: Yes Static Standing Balance Static Standing - Balance Support: During functional activity;No upper extremity supported;Left upper extremity supported;Right upper extremity supported Static Standing - Level of Assistance: 6: Modified independent (Device/Increase time) Dynamic Standing Balance Dynamic Standing - Balance Support: During  functional activity;No upper extremity supported;Left upper extremity supported;Right upper extremity supported Dynamic Standing - Level of Assistance: 6: Modified  independent (Device/Increase time) Dynamic Standing - Balance Activities: Other (comment) (combing hair) High Level Balance High Level Balance Activites: Direction changes;Turns;Sudden stops;Head turns;Other (comment) (no overt LOB using cane)  End of Session PT - End of Session Activity Tolerance: Patient tolerated treatment well Patient left: in chair;with call bell/phone within reach;with family/visitor present Nurse Communication: Mobility status  GP     Serenity Fortner, Eliseo Gum 10/27/2012, 5:36 PM 10/27/2012  Harrison Bing, PT [phone] (pager)

## 2012-10-27 NOTE — Evaluation (Signed)
Occupational Therapy Evaluation Patient Details Name: Delaynie Stetzer MRN: [id number] DOB: Nov 17, 1940 Today's Date: 10/27/2012 Time: 4098-1191 OT Time Calculation (min): 14 min  OT Assessment / Plan / Recommendation Clinical Impression  72 yo female s/p fall with syncope. pt with recent fall previously. Pt with bruising on left eye brow area and down lateral aspect of abdomen. Pt does not require skilled OT acutely. No ot follow up    OT Assessment  Patient does not need any further OT services    Follow Up Recommendations  No OT follow up    Barriers to Discharge      Equipment Recommendations  None recommended by OT    Recommendations for Other Services    Frequency       Precautions / Restrictions Precautions Precautions: Fall   Pertinent Vitals/Pain None    ADL  Eating/Feeding: Independent Where Assessed - Eating/Feeding: Edge of bed Grooming: Independent;Brushing hair Where Assessed - Grooming: Unsupported standing Lower Body Dressing: Modified independent Where Assessed - Lower Body Dressing: Unsupported sitting (don shoes) Toilet Transfer: Modified independent Toilet Transfer Method: Sit to Barista: Comfort height toilet Toileting - Clothing Manipulation and Hygiene: Modified independent Where Assessed - Engineer, mining and Hygiene: Sit to stand from 3-in-1 or toilet Equipment Used: Rolling walker Transfers/Ambulation Related to ADLs: Pt ambulated mod I with RW,. Pt needed v/c for RW safety. PTA ambulating with cane. Pt provided RW due to recent fall x2 and Rt wrist in splint.  ADL Comments: Pt reports comfort using RW with wrist splint don on Rt UE. Pt don shoes and completed bed mobility no deficits. Pt does not require skilled OT acutely.     OT Diagnosis:    OT Problem List:   OT Treatment Interventions:     OT Goals    Visit Information  Last OT Received On: 10/27/12 Assistance Needed: +1    Subjective  Data  Subjective: "I just have no warning when it happens" Patient Stated Goal: to return home   Prior Functioning     Home Living Lives With: Spouse Available Help at Discharge: Family Type of Home: House Home Access: Stairs to enter;Ramped entrance Entrance Stairs-Number of Steps: 3 Entrance Stairs-Rails: Can reach both Home Layout: One level Bathroom Shower/Tub: Forensic scientist: Handicapped height Home Adaptive Equipment: Wheelchair - manual;Walker - rolling;Straight cane;Grab bars in shower;Hand-held shower hose (bench) Prior Function Level of Independence: Independent Able to Take Stairs?: No Driving: No Vocation: Retired Musician: No difficulties Dominant Hand: Right         Vision/Perception Vision - History Baseline Vision: Wears glasses only for reading Patient Visual Report: No change from baseline   Cognition  Cognition Overall Cognitive Status: Appears within functional limits for tasks assessed/performed Arousal/Alertness: Awake/alert Orientation Level: Appears intact for tasks assessed Behavior During Session: [facility] for tasks performed    Extremity/Trunk Assessment Right Upper Extremity Assessment RUE ROM/Strength/Tone: Within functional levels RUE Sensation: WFL - Light Touch RUE Coordination: WFL - gross motor (limited due to splint( wrist cock up prefabricated)) Left Upper Extremity Assessment LUE ROM/Strength/Tone: Within functional levels LUE Sensation: WFL - Light Touch LUE Coordination: WFL - gross/fine motor Trunk Assessment Trunk Assessment: Kyphotic     Mobility Bed Mobility Bed Mobility: Supine to Sit;Sitting - Scoot to Edge of Bed;Sit to Supine Supine to Sit: 7: Independent;HOB elevated Sitting - Scoot to Edge of Bed: 7: Independent Sit to Supine: 7: Independent;HOB flat Transfers Transfers: Sit to Stand;Stand to Sit Sit to Stand:  6: Modified independent (Device/Increase time);With upper  extremity assist;From bed Stand to Sit: 6: Modified independent (Device/Increase time);With upper extremity assist;To bed     Exercise     Balance Balance Balance Assessed: Yes Static Standing Balance Static Standing - Balance Support: No upper extremity supported;During functional activity Static Standing - Level of Assistance: 6: Modified independent (Device/Increase time) Dynamic Standing Balance Dynamic Standing - Balance Support: During functional activity;No upper extremity supported Dynamic Standing - Level of Assistance: 6: Modified independent (Device/Increase time) Dynamic Standing - Balance Activities: Other (comment) (combing hair) High Level Balance High Level Balance Activites: Head turns;Sudden stops;Turns;Backward walking   End of Session OT - End of Session Activity Tolerance: Patient tolerated treatment well Patient left: in bed;with call bell/phone within reach;with family/visitor present Nurse Communication: Mobility status;Precautions  GO     Lucile Shutters 10/27/2012, 3:04 PM Pager: [phone]

## 2012-10-27 NOTE — Progress Notes (Signed)
[facility]  Subjective: Feels ok. She has not been out of bed so no more syncopal spells. She had indigestion last night that was relieved by antacids.  Objective: Vital signs in last 24 hours: Temp:  [97.9 F (36.6 C)-98.2 F (36.8 C)] 98.2 F (36.8 C) (04/10 0505) Pulse Rate:  [74-103] 74 (04/10 0505) Resp:  [18] 18 (04/09 2019) BP: (115-130)/(72-93) 115/93 mmHg (04/10 0505) SpO2:  [93 %-97 %] 93 % (04/10 0505) Weight:  [248 lb (112.492 kg)] 248 lb (112.492 kg) (04/10 0505) Last BM Date: 10/25/12  Intake/Output from previous day: 04/09 0701 - 04/10 0700 In: 240 [P.O.:240] Out: 300 [Urine:300] Intake/Output this shift:    Medications Current Facility-Administered Medications  Medication Dose Route Frequency Provider Last Rate Last Dose  . acetaminophen (TYLENOL) tablet 650 mg  650 mg Oral Q6H PRN Cristal Ford, MD       Or  . acetaminophen (TYLENOL) suppository 650 mg  650 mg Rectal Q6H PRN Cristal Ford, MD      . albuterol (PROVENTIL) (5 MG/ML) 0.5% nebulizer solution 2.5 mg  2.5 mg Nebulization Q6H PRN Cristal Ford, MD      . ALPRAZolam Prudy Feeler) tablet 0.5 mg  0.5 mg Oral QID Cristal Ford, MD   0.5 mg at 10/27/12 1610  . alum & mag hydroxide-simeth (MAALOX/MYLANTA) 200-200-20 MG/5ML suspension 30 mL  30 mL Oral Q4H PRN Laveda Norman, MD   30 mL at 10/26/12 1905  . amitriptyline (ELAVIL) tablet 50 mg  50 mg Oral QHS Cristal Ford, MD   50 mg at 10/26/12 2141  . aspirin EC tablet 81 mg  81 mg Oral QPM Cristal Ford, MD   81 mg at 10/26/12 1719  . calcium-vitamin D (OSCAL WITH D) 500-200 MG-UNIT per tablet 1 tablet  1 tablet Oral BID WC Cristal Ford, MD   1 tablet at 10/26/12 1719  . docusate sodium (COLACE) capsule 200 mg  200 mg Oral QHS Cristal Ford, MD   200 mg at 10/26/12 2142  . ferrous sulfate tablet 325 mg  325 mg Oral QPM Cristal Ford, MD   325 mg at 10/26/12 1720  . furosemide (LASIX) tablet 40 mg  40 mg Oral BID Cristal Ford, MD   40 mg at 10/27/12 0839  . gabapentin (NEURONTIN) tablet 600 mg  600 mg Oral TID AC & HS Cristal Ford, MD   600 mg at 10/27/12 9604  . guaiFENesin (MUCINEX) 12 hr tablet 1,200 mg  1,200 mg Oral BID Cristal Ford, MD   1,200 mg at 10/26/12 2142  . isosorbide mononitrate (IMDUR) 24 hr tablet 30 mg  30 mg Oral QHS Cristal Ford, MD   30 mg at 10/26/12 2141  . metoprolol tartrate (LOPRESSOR) tablet 50 mg  50 mg Oral BID Cristal Ford, MD   50 mg at 10/26/12 2141  . multivitamin with minerals tablet 1 tablet  1 tablet Oral Daily Cristal Ford, MD   1 tablet at 10/26/12 1109  . nitroGLYCERIN (NITROSTAT) SL tablet 0.4 mg  0.4 mg Sublingual Q5 min PRN Laveda Norman, MD      . ondansetron [facility]) tablet 4 mg  4 mg Oral Q6H PRN Cristal Ford, MD       Or  . ondansetron (ZOFRAN) injection 4 mg  4 mg Intravenous Q6H PRN Cristal Ford, MD      . oxyCODONE-acetaminophen (  PERCOCET/ROXICET) 5-325 MG per tablet 2 tablet  2 tablet Oral Q4H PRN Cristal Ford, MD   2 tablet at 10/27/12 0630  . pantoprazole (PROTONIX) EC tablet 40 mg  40 mg Oral BID AC Cristal Ford, MD   40 mg at 10/27/12 0839  . potassium chloride SA (K-DUR,KLOR-CON) CR tablet 20 mEq  20 mEq Oral BID Cristal Ford, MD   20 mEq at 10/26/12 2142  . promethazine (PHENERGAN) tablet 25 mg  25 mg Oral Q6H PRN Cristal Ford, MD      . simvastatin (ZOCOR) tablet 20 mg  20 mg Oral QHS Cristal Ford, MD   20 mg at 10/26/12 2141  . warfarin (COUMADIN) tablet 6 mg  6 mg Oral ONCE-1800 Crystal Salyer, [facility]      . Warfarin - Pharmacist Dosing Inpatient   Does not apply q1800 Laveda Norman, MD        PE: General appearance: alert, cooperative, no distress and morbidly obese Neck: no carotid bruit Lungs: mild expiratory wheezes heard throughout Heart: irregularly irregular rhythm and distant heart sounds Extremities: no LEE Pulses: 2+ and symmetric Skin: warm and dry Neurologic: Grossly normal  Lab Results:   Recent  Labs  10/25/12 1919 10/26/12 0547  WBC 4.4 4.9  HGB 10.5* 9.8*  HCT 32.8* 30.5*  PLT 122* 111*   BMET  Recent Labs  10/25/12 1919 10/26/12 0547  NA 136 137  K 4.4 4.0  CL 96 98  CO2 32 30  GLUCOSE 107* 89  BUN 27* 28*  CREATININE 1.56* 1.48*  CALCIUM 9.2 9.0   PT/INR  Recent Labs  10/25/12 2217 10/26/12 0547 10/27/12 0130  LABPROT 17.4* 18.2* 18.2*  INR 1.47 1.56* 1.56*   Cardiac Enzymes Cardiac Panel (last 3 results)  Recent Labs  10/26/12 1110 10/26/12 1942 10/27/12 0130  TROPONINI <0.30 <0.30 <0.30    Studies/Results: 2D Echo 10/26/12 Study Conclusions  - Left ventricle: The cavity size was normal. There was mild concentric hypertrophy. Systolic function was normal. The estimated ejection fraction was in the range of 55% to 65%, with beat to beat variation. LV diastolic function cannot be assessed due to atrial fibrillation. - Mitral valve: Calcified annulus. Mild regurgitation. - Left atrium: The atrium was moderately dilated. Volume index: 36.82ml/m^2 (S). - Right atrium: Severely dilated (31.6 cm2). - Tricuspid valve: Moderate regurgitation. - Pulmonary arteries: PA peak pressure: 49mm Hg (S). - Inferior vena cava: The vessel was dilated; the respirophasic diameter changes were blunted (< 50%); findings are consistent with elevated central venous pressure. - Pericardium, extracardiac: There was no pericardial effusion.  Orthostatics :  Lying: 112/53 Sitting: 112/49 Standing: 111/91  Assessment/Plan  Principal Problem:   Syncope and collapse Active Problems:   HYPERLIPIDEMIA   Morbid obesity   LOW BACK PAIN, CHRONIC   CAD (coronary artery disease), with CABG in 1997 and negative Nuc. 01/2012   Anticoagulated on Coumadin, chronic for A. Fib   atrial fib, permanent on coumadin for anticoagulation   DJD (degenerative joint disease)   COPD (chronic obstructive pulmonary disease)   Anemia, chronic disease   CKD (chronic kidney  disease), stage III  Plan: Telemetry continues to show a-fib, rate controlled in the 80-90s. There were occasional PVCs and 1-2 missed beats overnight, but no other arrhythmias or heart block. Echo yesterday showed normal systolic function with EF of 55-65%. No AS. + Mild MR and TR. No carotid bruits. No hypotension/ orthostatic hypotension (see recordings above). Troponin  negative x 3. EKG without acute changes. From a cardiovascular standpoint, can probably discharge home today. Will call our office to arrange a 30 day event monitor.  Will also arrange F/U ~ 4 weeks to review monitor results. Pt also advised that if any further syncopal episodes result in head trauma to report to ED for evaluation (on chronic anticoagulation with warfarin). MD to follow.     LOS: 2 days    Brittainy M. Sharol Harness, PA-C 10/27/2012 10:45 AM

## 2012-10-27 NOTE — Progress Notes (Signed)
Pt. Seen and examined. Agree with the NP/PA-C note as written.  Chest pain last evening seems to be reflux. Cardiac markers remain negative and he had improvements with antiacids. No further syncope. Echo shows relatively preserved LVEF with mostly right heart findings due to COPD. Will arrange monitoring through our office. Probably okay to d/c today from our standpoint.  Chrystie Nose, MD, [facility] Attending Cardiologist [facility]

## 2012-10-28 DIAGNOSIS — I2510 Atherosclerotic heart disease of native coronary artery without angina pectoris: Secondary | ICD-10-CM

## 2012-10-28 LAB — BASIC METABOLIC PANEL
CO2: 33 mEq/L — ABNORMAL HIGH (ref 19–32)
Calcium: 9.7 mg/dL (ref 8.4–10.5)
GFR calc non Af Amer: 32 mL/min — ABNORMAL LOW (ref >90)
Glucose, Bld: 105 mg/dL — ABNORMAL HIGH (ref 70–99)
Potassium: 4.3 mEq/L (ref 3.5–5.1)
Sodium: 138 mEq/L (ref 135–145)

## 2012-10-28 LAB — PROTIME-INR: INR: 1.75 — ABNORMAL HIGH (ref 0.00–1.49)

## 2012-10-28 NOTE — Discharge Summary (Signed)
Physician Discharge Summary  [name] ZOX:[id number] DOB: 11-30-1940 DOA: 10/25/2012  PCP: Michele Mcalpine, MD  Admit date: 10/25/2012 Discharge date: 10/28/2012  Time spent: 40 minutes  Recommendations for Outpatient Follow-up:  1. Follow up with PMD. 2. Follow up with primary cardiologist.   Discharge Diagnoses:  Principal Problem:   Syncope and collapse Active Problems:   HYPERLIPIDEMIA   Morbid obesity   LOW BACK PAIN, CHRONIC   CAD (coronary artery disease), with CABG in 1997 and negative Nuc. 01/2012   Anticoagulated on Coumadin, chronic for A. Fib   atrial fib, permanent on coumadin for anticoagulation   DJD (degenerative joint disease)   COPD (chronic obstructive pulmonary disease)   Anemia, chronic disease   CKD (chronic kidney disease), stage III   Discharge Condition: Satisfactory.   Diet recommendation: Heart-Healthy.  Filed Weights   10/26/12 0058 10/27/12 0505 10/28/12 0536  Weight: 116.121 kg (256 lb) 112.492 kg (248 lb) 112.492 kg (248 lb)    History of present illness:  72 y.o. Caucasian female with history of coronary artery disease status post CABG, atrial fibrillation on Coumadin for chronic anticoagulation, COPD, chronic kidney disease stage III, history of GI bleed in September of 2013 with negative EGD and colonoscopy, and chronic low back pain. Patient reports that over the last month to month and a half she has had intermittent episodes of loss of consciousness which happens suddenly. Her last episode was about a week ago on 10/18/2012, she had a head CT which was negative. In evening of 10/25/12, she had another episode of loss of consciousness while she was walking to the bathroom. She denied any antecedent dizziness, lightheadedness, chest pain, or shortness of breath. She is not sure how long she was on the floor, but she woke up feeling pain from multiple contusions she sustained. She did not lose control of her bowel or bladder. She denies any  recent fevers, chills, nausea, vomiting, chest pain, shortness of breath, abdominal pain, diarrhea, headaches or vision changes. Admitted for further management.    Hospital Course:  1. Syncope:  Etiology unclear. Patient has had recurrent episodes of syncope, without prodrome, raising suspicion for a cardiogenic etiology. Last episode was on 10/18/12. Cardiac enzymes were unelevated. Monitored telemetrically, and no concerning arrhythmias were evident. Brain MRI, was negative for acute findings, carotid dopplers showed no significant extracranial carotid artery stenosis. Vertebrals are patent with antegrade flow. ICA/CCA ratio: right = 1.32. left = 1.42. 2-D echocardiogram showed EF of 55% to 65%, with beat to beat variation. LV diastolic function could not be assessed due to atrial fibrillation. There was mild mitral regurgitation, left atrium was moderately dilated and right atrium was everely dilated (31.6 cm2). There was moderate tricuspid regurgitation. Dr Lewayne Bunting provided EPS consultation, and has opined that arrhythmogenic syncope seems less likely. Her history suggests orthostasis, given that nearly all of her episodes have occurred with positional/postural changes and prodrome. She had no recurrences during her hospitalization, was evaluated by PT/OT and no acute needs identified.  2. A. Fib: Rate controlled. INR was sub-therapeutic at presentation, and Coumadin dosing was managed per pharmacy.  3. Hypertension: Controlled on pre-admission antihypertensives.  4. Chronic kidney disease stage III: Baseline creatinine is 1.43-1.7 (06/2012-09/2012). Creatinine was 1.48 at presentation, ie, stable and at baseline.  5. Anemia: Mild-moderate, normocytic, likely due to chronic disease, although patient does have a recent history of GI bleed, requiring blood transfusion. Followed CBC and HB remained stable.  6. COPD: Stable/Asymptomatic.  7. Coronary artery  disease status post CABG: No clinical  evidence of ACS. Continue aspirin and metoprolol. Stable. Cardiac enzymes remained unelevated. She did have a brief episode of lower chest/epigastric discomfort in evening of 10/26/12, which resolved with Maalox. No recurrences. Dr Rennis Golden provided cardiology consultation, and concurs that this was likely of GI etiology.  8. Recent history of UTI: Completed course of Keflex on 10/25/12. Urinalysis is negative. Reassured accordingly.  9. Degenerative joint disease/chronic low back pain: Stable/Not problematic. Marland Kitchen  10. Hyperlipidemia: Continued on Statin.    Procedures:  See Below.   Consultations:  Dr Lewayne Bunting, cardiology/EPS.  Dr Rennis Golden, cardiologist.   Discharge Exam: Filed Vitals:   10/27/12 1842 10/27/12 2035 10/27/12 2242 10/28/12 0536  BP:  114/51  117/62  Pulse:  89  73  Temp:  97.5 F (36.4 C)  97.3 F (36.3 C)  TempSrc:  Axillary  Axillary  Resp:      Height:      Weight:    112.492 kg (248 lb)  SpO2: 93% 96% 99% 94%   General: Comfortable, alert, communicative, fully oriented, not short of breath at rest.  HEENT: Mild clinical pallor, no jaundice, no conjunctival injection or discharge.  NECK: Supple, JVP not seen, no carotid bruits, no palpable lymphadenopathy, no palpable goiter.  CHEST: Clinically clear to auscultation, no wheezes, no crackles. Median sternotomy scar noted.  HEART: Sounds 1 and 2 heard, normal, irregular, no murmurs.  ABDOMEN: Obese, soft, no scars, non-tender, no palpable organomegaly, no palpable masses, normal bowel sounds.  GENITALIA: Not examined. .  LOWER EXTREMITIES: No pitting edema, palpable peripheral pulses.  MUSCULOSKELETAL SYSTEM: Fading bruise right wrist, no deformity, although tender, abrasion right upper shin, under dressing. Generalized osteoarthritic changes, otherwise, normal.  CENTRAL NERVOUS SYSTEM: No focal neurologic deficit on gross examination.  Discharge Instructions       Future Appointments Provider Department Dept  Phone   10/31/2012 2:30 PM [facility] [phone]   12/02/2012 2:00 PM Runell Gess, MD [facility] [place] [phone]   01/23/2013 11:30 AM Michele Mcalpine, MD [facility] [phone]       Medication List    ASK your doctor about these medications       albuterol (2.5 MG/3ML) 0.083% nebulizer solution  Commonly known as:  PROVENTIL  Take 2.5 mg by nebulization 3 (three) times daily as needed for wheezing or shortness of breath.     ALPRAZolam 0.5 MG tablet  Commonly known as:  XANAX  Take 0.5 mg by mouth 4 (four) times daily.     amitriptyline 50 MG tablet  Commonly known as:  ELAVIL  Take 50 mg by mouth at bedtime.     aspirin EC 81 MG tablet  Take 81 mg by mouth every evening.     calcium-vitamin D 500-200 MG-UNIT per tablet  Take 1 tablet by mouth 2 (two) times daily with a meal.     cephALEXin 500 MG capsule  Commonly known as:  KEFLEX  Take 1 capsule (500 mg total) by mouth 4 (four) times daily.     docusate sodium 100 MG capsule  Commonly known as:  COLACE  Take 200 mg by mouth at bedtime.     ferrous sulfate 325 (65 FE) MG tablet  Take 325 mg by mouth every evening.     furosemide 40 MG tablet  Commonly known as:  LASIX  Take 40 mg by mouth 2 (two) times daily.     gabapentin  600 MG tablet  Commonly known as:  NEURONTIN  Take 600 mg by mouth 4 (four) times daily.     GAS-X PO  Take 1 tablet by mouth daily as needed (gas).     guaiFENesin 600 MG 12 hr tablet  Commonly known as:  MUCINEX  Take 1,200 mg by mouth 2 (two) times daily.     hydrocortisone cream 1 %  Apply 1 application topically 2 (two) times daily.     isosorbide mononitrate 30 MG 24 hr tablet  Commonly known as:  IMDUR  Take 30 mg by mouth at bedtime.     metoprolol 50 MG tablet  Commonly known as:  LOPRESSOR  Take 50 mg by mouth 2 (two) times daily.     multivitamin with minerals Tabs  Take 1 tablet by mouth daily.      omeprazole 20 MG capsule  Commonly known as:  PRILOSEC  Take 20 mg by mouth 2 (two) times daily.     oxyCODONE-acetaminophen 5-325 MG per tablet  Commonly known as:  PERCOCET/ROXICET  Take 2 tablets by mouth every 4 (four) hours as needed for pain.     potassium chloride SA 20 MEQ tablet  Commonly known as:  K-DUR,KLOR-CON  Take 20 mEq by mouth 2 (two) times daily.     promethazine 25 MG tablet  Commonly known as:  PHENERGAN  Take 25 mg by mouth every 6 (six) hours as needed for nausea.     simvastatin 20 MG tablet  Commonly known as:  ZOCOR  Take 20 mg by mouth at bedtime.     vitamin B-12 1000 MCG tablet  Commonly known as:  CYANOCOBALAMIN  Take 1,000 mcg by mouth daily.     warfarin 5 MG tablet  Commonly known as:  COUMADIN  Take 2.5-5 mg by mouth daily. 5mg  Sunday, Tuesday, Thursday, and Saturday   2.5mg  on monday,wednesday,friday          The results of significant diagnostics from this hospitalization (including imaging, microbiology, ancillary and laboratory) are listed below for reference.    Significant Diagnostic Studies: Dg Elbow Complete Right  10/18/2012  *RADIOLOGY REPORT*  Clinical Data: Fall, elbow pain/bruising  RIGHT ELBOW - COMPLETE 3+ VIEW  Comparison: None.  Findings: No fracture or dislocation is seen.  The joint spaces are preserved.  The visualized soft tissues are unremarkable.  No displaced elbow joint fat pads to suggest an elbow joint effusion.  IMPRESSION: No fracture or dislocation is seen.   Original Report Authenticated By: Charline Bills, M.D.    Dg Wrist Complete Right  10/18/2012  *RADIOLOGY REPORT*  Clinical Data: Fall, right wrist pain  RIGHT WRIST - COMPLETE 3+ VIEW  Comparison: None.  Findings: No fracture or dislocation is seen.  The joint spaces are preserved.  The visualized soft tissues are unremarkable.  IMPRESSION: No fracture or dislocation is seen.   Original Report Authenticated By: Charline Bills, M.D.    Ct Head Wo Contrast  (only If Suspected Head Trauma And/or Pt Is On Anticoagulant)  10/18/2012  *RADIOLOGY REPORT*  Clinical Data: Syncopal episodes  CT HEAD WITHOUT CONTRAST  Technique:  Contiguous axial images were obtained from the base of the skull through the vertex without contrast.  Comparison: 10/28/2004  Findings:  Grossly unchanged mild diffuse atrophy with prominence of the bifrontal extra-axial spaces and centralized volume loss with commiserate ex vacuo dilatation of the ventricular system. Scattered minimal periventricular hypodensities compatible microvascular ischemic disease.  Given background parenchymal abnormalities, there  is no CT evidence of acute large territory infarct.  No intraparenchymal or extra-axial mass or hemorrhage. Normal size and configuration of the ventricles and basilar cisterns.  No midline shift.  Limited visualization of the paranasal sinuses and mastoid air cells are normal.  Regional soft tissues are normal.  No displaced calvarial fracture.  Post left- sided cataract surgery.  IMPRESSION:  1.  No acute intracranial process. 2.  Stable findings of atrophy and mild microvascular ischemic disease.   Original Report Authenticated By: Tacey Ruiz, MD    Mr Brain Wo Contrast  10/26/2012  *RADIOLOGY REPORT*  Clinical Data: Multiple syncopal episodes over the last 90 days.  MRI HEAD WITHOUT CONTRAST  Technique:  Multiplanar, multiecho pulse sequences of the brain and surrounding structures were obtained according to standard protocol without intravenous contrast.  Comparison: CT 10/18/2012  Findings: Diffusion imaging does not show any acute or subacute infarction.  There are chronic small vessel changes affecting the pons.  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  There are mild chronic small vessel changes of the cerebral hemispheric white matter.  No cortical or large vessel territory infarction. No mass lesion, hemorrhage, hydrocephalus or extra-axial collection.  No pituitary mass.   No inflammatory sinus disease.  No skull or skull base lesion.  Major vessels at the base of the brain show flow.  IMPRESSION: No acute finding.  Atrophy and chronic small vessel disease.   Original Report Authenticated By: Paulina Fusi, M.D.    Dg Foot Complete Right  10/25/2012  *RADIOLOGY REPORT*  Clinical Data: Fall, bruising, pain .  RIGHT FOOT COMPLETE - 3+ VIEW  Comparison: None.  Findings: bones are osteopenic.  Minor degenerative changes of the interphalangeal joints.  Normal alignment without fracture evident  IMPRESSION: No acute osseous finding   Original Report Authenticated By: [name]. Miles Costain, M.D.     Microbiology: Recent Results (from the past 240 hour(s))  URINE CULTURE     Status: None   Collection Time    10/18/12  9:39 PM      Result Value Range Status   Specimen Description URINE, CLEAN CATCH   Final   Special Requests NONE   Final   Culture  Setup Time 10/19/2012 03:06   Final   Colony Count NO GROWTH   Final   Culture NO GROWTH   Final   Report Status 10/20/2012 FINAL   Final  MRSA PCR SCREENING     Status: None   Collection Time    10/26/12  2:11 AM      Result Value Range Status   MRSA by PCR NEGATIVE  NEGATIVE Final   Comment:            The GeneXpert MRSA Assay (FDA     approved for NASAL specimens     only), is one component of a     comprehensive MRSA colonization     surveillance program. It is not     intended to diagnose MRSA     infection nor to guide or     monitor treatment for     MRSA infections.     Labs: Basic Metabolic Panel:  Recent Labs Lab 10/25/12 1919 10/26/12 0547 10/28/12 0454  NA 136 137 138  K 4.4 4.0 4.3  CL 96 98 98  CO2 32 30 33*  GLUCOSE 107* 89 105*  BUN 27* 28* 27*  CREATININE 1.56* 1.48* 1.59*  CALCIUM 9.2 9.0 9.7   Liver Function Tests:  Recent  Labs Lab 10/25/12 1919  AST 22  ALT 9  ALKPHOS 123*  BILITOT 0.5  PROT 7.8  ALBUMIN 3.9   No results found for this basename: LIPASE, AMYLASE,  in the last 168  hours No results found for this basename: AMMONIA,  in the last 168 hours CBC:  Recent Labs Lab 10/25/12 1919 10/26/12 0547  WBC 4.4 4.9  NEUTROABS 2.4  --   HGB 10.5* 9.8*  HCT 32.8* 30.5*  MCV 92.1 91.3  PLT 122* 111*   Cardiac Enzymes:  Recent Labs Lab 10/26/12 0547 10/26/12 1110 10/26/12 1942 10/27/12 0130 10/27/12 0930  TROPONINI <0.30 <0.30 <0.30 <0.30 <0.30   BNP: BNP (last 3 results)  Recent Labs  12/22/11 1233 06/29/12 0446  PROBNP 179.0* 1984.0*   CBG: No results found for this basename: GLUCAP,  in the last 168 hours     Signed:  Makaylynn Bowers,CHRISTOPHER  [name] 10/28/2012, 10:23 AM

## 2012-10-28 NOTE — Progress Notes (Signed)
Reviewed discharge instructions with patient and husband, they stated their understanding.  Patient discharged home with husband by wheelchair and volunteer.  Colman Cater

## 2012-10-31 NOTE — ED Provider Notes (Signed)
History    72yf with syncope. Ambulating to bathroom at home just prior to arrival when passed out. No preceding symptoms. Less than 1 minute LOC. No seizure activity. No incontinence or tongue biting. Quick return to baseline. Injured R foot in fall and c/o pain. No other complaints. Currently. Multiple similar episodes recently currently being w/u by PCP. NO fever or chills. No n/v. No cp or sob.  CSN: [id number]  Arrival date & time 10/25/12  1901   First MD Initiated Contact with Patient 10/25/12 2220      Chief Complaint  Patient presents with  . Loss of Consciousness  . Fall    (Consider location/radiation/quality/duration/timing/severity/associated sxs/prior treatment) HPI  Past Medical History  Diagnosis Date  . Coronary atherosclerosis of unspecified type of vessel, native or graft   . Atrial fibrillation 01/19/2012    stress test - non-gated secondary to A fib,  normal myocaridal perfusion study  . Unspecified venous (peripheral) insufficiency   . Other and unspecified hyperlipidemia   . Morbid obesity   . Esophageal reflux   . Irritable bowel syndrome   . Other symptoms involving urinary system   . Chronic pain syndrome   . 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 Anxiety state, unspecified   . Herpes zoster without mention of complication   . Herpes zoster with other nervous system complications   . Lumbago   . Anticoagulated on Coumadin, chronic for A. Fib 04/08/2012  . COPD (chronic obstructive pulmonary disease)   . 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 Pneumonia 4 years ago  . Bronchitis, chronic   . Fibromyalgia   . Arthritis   . CAD (coronary artery disease) 04/29/2006    echo -EF 45-50%, impaired LV relaxation  . 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 GI bleed 04/07/2012    endoscopy/colonoscopy unremarkable  . 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 DJD (degenerative joint  disease)     Past Surgical History  Procedure Laterality Date  . Vesicovaginal fistula closure w/ tah    . Cardiac catheterization  01/06/2010    RCA mid artery stenosis at 99%, LAD proximal occlusion 90%  at origin of ramus  . Nissen fundoplication    . Esophagogastroduodenoscopy  04/09/2012    Procedure: ESOPHAGOGASTRODUODENOSCOPY (EGD);  Surgeon: Theda Belfast, MD;  Location: [facility] ENDOSCOPY;  Service: Endoscopy;  Laterality: N/A;  tentatively scheduled per Blase Mess due to patients breathing problems  . Colonoscopy  04/11/2012    Procedure: COLONOSCOPY;  Surgeon: Meryl Dare, MD,FACG;  Location: [facility] ENDOSCOPY;  Service: Endoscopy;  Laterality: N/A;  . Abdominal hysterectomy  at 72 years old  . Appendectomy  about 50 years ago  . Coronary artery bypass graft  1997    LIMA to LAD, SVG to ramus, SVG to RCA  . Cholecystectomy  50 years ago  . Neck surgery  73 years old    full movement  . Right knee arthroscopy  3 years ago  . Eye surgery  2005    cataracts both eyes  . Back surgery      x5, "birdcage" and fused in lower back  . Total knee arthroplasty  06/28/2012    Procedure: TOTAL KNEE ARTHROPLASTY;  Surgeon: Eugenia Mcalpine, MD;  Location: [facility] ORS;  Service: Orthopedics;  Laterality: Left;  . Joint replacement Left 06/28/12    knee replacement    Family History  Problem  Relation Age of Onset  . Heart disease Mother   . Lung disease Father   . Kidney disease      History  Substance Use Topics  . Smoking status: Former Smoker -- 1.00 packs/day for 22 years    Types: Cigarettes    Quit date: 07/20/1984  . Smokeless tobacco: Never Used  . Alcohol Use: No    OB History   Grav Para Term Preterm Abortions TAB SAB Ect Mult Living                  Review of Systems  .revuiew   Allergies  Review of patient's allergies indicates no known allergies.  Home Medications   Current Outpatient Rx  Name  Route  Sig  Dispense  Refill  . albuterol (PROVENTIL) (2.5  MG/3ML) 0.083% nebulizer solution   Nebulization   Take 2.5 mg by nebulization 3 (three) times daily as needed for wheezing or shortness of breath.         . ALPRAZolam (XANAX) 0.5 MG tablet   Oral   Take 0.5 mg by mouth 4 (four) times daily.         Marland Kitchen amitriptyline (ELAVIL) 50 MG tablet   Oral   Take 50 mg by mouth at bedtime.         Marland Kitchen aspirin EC 81 MG tablet   Oral   Take 81 mg by mouth every evening.          . Calcium Carbonate-Vitamin D (CALCIUM-VITAMIN D) 500-200 MG-UNIT per tablet   Oral   Take 1 tablet by mouth 2 (two) times daily with a meal.         . docusate sodium (COLACE) 100 MG capsule   Oral   Take 200 mg by mouth at bedtime.         . ferrous sulfate 325 (65 FE) MG tablet   Oral   Take 325 mg by mouth every evening.         . furosemide (LASIX) 40 MG tablet   Oral   Take 40 mg by mouth 2 (two) times daily.          Marland Kitchen gabapentin (NEURONTIN) 600 MG tablet   Oral   Take 600 mg by mouth 4 (four) times daily.         Marland Kitchen guaiFENesin (MUCINEX) 600 MG 12 hr tablet   Oral   Take 1,200 mg by mouth 2 (two) times daily.         . hydrocortisone cream 1 %   Topical   Apply 1 application topically 2 (two) times daily.         . isosorbide mononitrate (IMDUR) 30 MG 24 hr tablet   Oral   Take 30 mg by mouth at bedtime.          . metoprolol (LOPRESSOR) 50 MG tablet   Oral   Take 50 mg by mouth 2 (two) times daily.         . Multiple Vitamin (MULTIVITAMIN WITH MINERALS) TABS   Oral   Take 1 tablet by mouth daily.         Marland Kitchen omeprazole (PRILOSEC) 20 MG capsule   Oral   Take 20 mg by mouth 2 (two) times daily.         Marland Kitchen oxyCODONE-acetaminophen (PERCOCET/ROXICET) 5-325 MG per tablet   Oral   Take 2 tablets by mouth every 4 (four) hours as needed for pain.         Marland Kitchen  potassium chloride SA (K-DUR,KLOR-CON) 20 MEQ tablet   Oral   Take 20 mEq by mouth 2 (two) times daily.         . promethazine (PHENERGAN) 25 MG tablet    Oral   Take 25 mg by mouth every 6 (six) hours as needed for nausea.         . Simethicone (GAS-X PO)   Oral   Take 1 tablet by mouth daily as needed (gas).         . simvastatin (ZOCOR) 20 MG tablet   Oral   Take 20 mg by mouth at bedtime.          Marland Kitchen warfarin (COUMADIN) 5 MG tablet   Oral   Take 2.5-5 mg by mouth daily. 5mg  Sunday, Tuesday, Thursday, and Saturday   2.5mg  on monday,wednesday,friday         . vitamin B-12 (CYANOCOBALAMIN) 1000 MCG tablet   Oral   Take 1,000 mcg by mouth daily.            BP 117/62  Pulse 73  Temp(Src) 97.3 F (36.3 C) (Axillary)  Resp 18  Ht 5\' 8"  (1.727 m)  Wt 248 lb (112.492 kg)  BMI 37.72 kg/m2  SpO2 94%  Physical Exam  Nursing note and vitals reviewed. Constitutional: She is oriented to person, place, and time. She appears well-developed and well-nourished. No distress.  Sitting in bed. NAD. Obese.   HENT:  Head: Normocephalic and atraumatic.  Eyes: Conjunctivae are normal. Right eye exhibits no discharge. Left eye exhibits no discharge.  Neck: Neck supple.  Cardiovascular: Normal rate, regular rhythm and normal heart sounds.  Exam reveals no gallop and no friction rub.   No murmur heard. Pulmonary/Chest: Effort normal and breath sounds normal. No respiratory distress.  Abdominal: Soft. She exhibits no distension. There is no tenderness.  Musculoskeletal: She exhibits no edema and no tenderness.  Some ecchymosis and mild tenderness distal dorsal aspect R foot. NVI distally.   Neurological: She is alert and oriented to person, place, and time. No cranial nerve deficit. She exhibits normal muscle tone. Coordination normal.  Skin: Skin is warm and dry.  Psychiatric: She has a normal mood and affect. Her behavior is normal. Thought content normal.    ED Course  Procedures (including critical care time)  Labs Reviewed  CBC WITH DIFFERENTIAL - Abnormal; Notable for the following:    RBC 3.56 (*)    Hemoglobin 10.5 (*)     HCT 32.8 (*)    RDW 17.8 (*)    Platelets 122 (*)    Eosinophils Relative 7 (*)    Basophils Relative 2 (*)    All other components within normal limits  COMPREHENSIVE METABOLIC PANEL - Abnormal; Notable for the following:    Glucose, Bld 107 (*)    BUN 27 (*)    Creatinine, Ser 1.56 (*)    Alkaline Phosphatase 123 (*)    GFR calc non Af Amer 32 (*)    GFR calc Af Amer 37 (*)    All other components within normal limits  PROTIME-INR - Abnormal; Notable for the following:    Prothrombin Time 17.4 (*)    All other components within normal limits  BASIC METABOLIC PANEL - Abnormal; Notable for the following:    BUN 28 (*)    Creatinine, Ser 1.48 (*)    GFR calc non Af Amer 34 (*)    GFR calc Af Amer 40 (*)    All other  components within normal limits  CBC - Abnormal; Notable for the following:    RBC 3.34 (*)    Hemoglobin 9.8 (*)    HCT 30.5 (*)    RDW 18.1 (*)    Platelets 111 (*)    All other components within normal limits  PROTIME-INR - Abnormal; Notable for the following:    Prothrombin Time 18.2 (*)    INR 1.56 (*)    All other components within normal limits  PROTIME-INR - Abnormal; Notable for the following:    Prothrombin Time 18.2 (*)    INR 1.56 (*)    All other components within normal limits  PROTIME-INR - Abnormal; Notable for the following:    Prothrombin Time 19.8 (*)    INR 1.75 (*)    All other components within normal limits  BASIC METABOLIC PANEL - Abnormal; Notable for the following:    CO2 33 (*)    Glucose, Bld 105 (*)    BUN 27 (*)    Creatinine, Ser 1.59 (*)    GFR calc non Af Amer 32 (*)    GFR calc Af Amer 37 (*)    All other components within normal limits  MRSA PCR SCREENING  MRSA PCR SCREENING  TROPONIN I  TROPONIN I  TROPONIN I  URINALYSIS, ROUTINE W REFLEX MICROSCOPIC  TROPONIN I  TROPONIN I  TROPONIN I   No results found.  EKG:  Rhythm: afib Vent. rate 93 BPM QRS duration 98 ms QT/QTc 386/479 ms ST segments: NS ST  changes   1. Syncope   2. Foot contusion, right, initial encounter   3. Anemia, chronic disease   4. Anticoagulated on Coumadin   5. Atrial fibrillation, permanent   6. HYPERLIPIDEMIA   7. Morbid obesity   8. LOW BACK PAIN, CHRONIC   9. CAD (coronary artery disease), with CABG in 1997 and negative Nuc. 01/2012   10. Anticoagulated on Coumadin, chronic for A. Fib   11. atrial fib, permanent on coumadin for anticoagulation   12. DJD (degenerative joint disease)   13. COPD (chronic obstructive pulmonary disease)   14. Syncope and collapse   15. CKD (chronic kidney disease), stage III   16. POSTHERPETIC NEURALGIA   17. HERPES ZOSTER   18. ANXIETY   19. Chronic pain syndrome   20. ARTERIOSCLEROTIC HEART DISEASE   21. VENOUS INSUFFICIENCY   22. BRONCHITIS, ACUTE WITH BRONCHOSPASM   23. IBS   24. RENAL INSUFFICIENCY   25. Other specified disorders of bladder   26. PAGET'S DISEASE   27. Other symptoms involving urinary system   28. COMPRESSION FRACTURE   29. Acute blood loss anemia, due to GI bleed   30. Otitis externa of right ear   31. GI bleed   32. Coagulopathy - on chronic warfarin therapy for Afib   33. Nonspecific abnormal finding in stool contents   34. Osteoarthritis of left knee   35. Fibromyalgia   36. GERD (gastroesophageal reflux disease)       MDM  72yF with syncope. Currently in process of being worked up by PCP. Given recurrent nature though feel that most prudent to admit at this time.         Raeford Razor, MD 10/31/12 [phone]

## 2012-12-02 ENCOUNTER — Ambulatory Visit: Payer: Medicare Other | Admitting: Cardiovascular Disease

## 2012-12-05 ENCOUNTER — Telehealth: Payer: Self-pay | Admitting: Cardiovascular Disease

## 2012-12-05 MED ORDER — WARFARIN SODIUM 5 MG PO TABS: 2.5000 mg | ORAL_TABLET | Freq: Every day | ORAL | Status: DC

## 2012-12-05 NOTE — Telephone Encounter (Signed)
She wants to talk to [name] only-Still passing out,she cant go on like this!

## 2012-12-06 ENCOUNTER — Telehealth: Payer: Self-pay | Admitting: Family Medicine

## 2012-12-06 ENCOUNTER — Other Ambulatory Visit: Payer: Self-pay | Admitting: Pulmonary Disease

## 2012-12-06 NOTE — Telephone Encounter (Signed)
Per Wilburt Finlay PA C, decrease metoprolol tart to 25mg  bid and decrease lasix to 40mg  QD. Patient verbalized understanding.  She will call back if no improvement.

## 2012-12-06 NOTE — Telephone Encounter (Signed)
Please call-having spells-dizzy and almost passed out Friday!Doesnt know whether she needs to be seen or not before 12-27-12-She says wants to talk to you!Also need a lab order!

## 2012-12-06 NOTE — Telephone Encounter (Signed)
Wilburt Finlay PA c made medication changes. See other phone note from 12/06/12

## 2012-12-11 ENCOUNTER — Other Ambulatory Visit: Payer: Self-pay | Admitting: Pulmonary Disease

## 2012-12-28 ENCOUNTER — Encounter: Payer: Self-pay | Admitting: Cardiovascular Disease

## 2012-12-28 ENCOUNTER — Ambulatory Visit (INDEPENDENT_AMBULATORY_CARE_PROVIDER_SITE_OTHER): Payer: Medicare Other | Admitting: Cardiovascular Disease

## 2012-12-28 ENCOUNTER — Ambulatory Visit (INDEPENDENT_AMBULATORY_CARE_PROVIDER_SITE_OTHER): Payer: Medicare Other | Admitting: Pharmacist Clinician (PhC)/ Clinical Pharmacy Specialist

## 2012-12-28 VITALS — BP 126/76 | HR 88 | Ht 67.5 in | Wt 243.7 lb

## 2012-12-28 DIAGNOSIS — I4891 Unspecified atrial fibrillation: Secondary | ICD-10-CM

## 2012-12-28 DIAGNOSIS — Z7901 Long term (current) use of anticoagulants: Secondary | ICD-10-CM

## 2012-12-28 DIAGNOSIS — I4821 Permanent atrial fibrillation: Secondary | ICD-10-CM

## 2012-12-28 DIAGNOSIS — I2510 Atherosclerotic heart disease of native coronary artery without angina pectoris: Secondary | ICD-10-CM

## 2012-12-28 DIAGNOSIS — E785 Hyperlipidemia, unspecified: Secondary | ICD-10-CM

## 2012-12-28 LAB — POCT INR: INR: 3.5

## 2012-12-28 NOTE — Assessment & Plan Note (Signed)
Rate controlled on Coumadin anticoagulation. She did have a vent monitor that showed pauses as well as 2.3 seconds which did not correlate with episodes of syncope. It was felt that her syncope may be related to orthostatic hypotension.

## 2012-12-28 NOTE — Patient Instructions (Addendum)
Your physician wants you to follow-up in: 6 months with an extender and 12 months with Dr Berry. You will receive a reminder letter in the mail two months in advance. If you don't receive a letter, please call our office to schedule the follow-up appointment.  

## 2012-12-28 NOTE — Assessment & Plan Note (Signed)
Status post coronary artery bypass grafting in 1997 with pre-cath performed 6/11 revealing patent grafts. Her last Myoview stress test performed 01/19/12 was nonischemic.

## 2012-12-28 NOTE — Assessment & Plan Note (Signed)
Followed by her primary care physician 

## 2012-12-28 NOTE — Progress Notes (Signed)
12/28/2012 [name]   04-02-1941  [id number]  Primary Physician NADEL,SCOTT [name], MD Primary Cardiologist: Runell Gess MD [name]   HPI:  This 72 year old patient was last seen by Dr. Allyson Sabal in October 2013. She had bypass grafting in 1997 and previously her last heart cath in June 2011 grafts were patent. She had a low risk Myoview in July 2013 and her EF on echo in 2007 was 45% to 50% EF. She had actually done quite well until September 2013 she had a GI bleed. Endoscopy and colonoscopy were unremarkable and she was put back on Coumadin for chronic permanent atrial fibrillation. She has done fairly well until she was hospitalized in December 2013 for a left knee arthroplasty that she tolerated well and Dr. Allyson Sabal had cleared her for that. Then in April of this year she was admitted for loss of consciousness. She was then discharged on the 11th. It was unclear reason for her syncope at that time. Her cardiac enzymes had been negative. EF was 55% to 65%. Dr. Ladona Ridgel had seen here and felt it was orthostatic hypotension. She also had some chest discomfort which it was felt was related to indigestion. She also had been to the emergency room prior to that visit and she had sprained her wrist and had a hematoma of her foot which has improved with ice. She is still a little swollen in that foot. But it was felt orthostatic hypotension, but she is in chronic atrial fibrillation and we put a monitor on her to evaluate for significant bradycardia on the monitor at this point and per patient. Since she has been home she has had 2 episodes, one full loss of consciousness on November 01, 2012, and then on the 16th she was very dizzy. But looking back through her rhythm strips there was no acute episode during those times. She does have some pauses, a 2.3-second pause. Heart rate occasionally will be down to 43 and that is during the daytime when she is awake as well as at night. But these were not  correlating with her syncopal or near syncopal episodes. Most of the time it is when she goes from sitting to standing. Since she was seen last in the office by Nada Boozer registered nurse practitioner on 11/25/12 she's had no recurrent symptoms.     Current Outpatient Prescriptions  Medication Sig Dispense Refill  . albuterol (PROVENTIL) (2.5 MG/3ML) 0.083% nebulizer solution USE IN NEB TREATMENT UP TO FOUR TIMES A DAY AS NEEDED FOR WHEEZING...  150 mL  3  . ALPRAZolam (XANAX) 0.5 MG tablet Take 0.5 mg by mouth 4 (four) times daily.      Marland Kitchen amitriptyline (ELAVIL) 50 MG tablet Take 50 mg by mouth at bedtime.      Marland Kitchen aspirin EC 81 MG tablet Take 81 mg by mouth every evening.       . Calcium Carbonate-Vitamin D (CALCIUM-VITAMIN D) 500-200 MG-UNIT per tablet Take 1 tablet by mouth 2 (two) times daily with a meal.      . docusate sodium (COLACE) 100 MG capsule Take 200 mg by mouth at bedtime.      . ferrous sulfate 325 (65 FE) MG tablet Take 325 mg by mouth every evening.      . furosemide (LASIX) 40 MG tablet Take 40 mg by mouth 2 (two) times daily.       Marland Kitchen gabapentin (NEURONTIN) 600 MG tablet Take 600 mg by mouth 4 (four) times daily.      Marland Kitchen  guaiFENesin (MUCINEX) 600 MG 12 hr tablet Take 1,200 mg by mouth 2 (two) times daily.      . hydrocortisone cream 1 % Apply 1 application topically 2 (two) times daily.      . isosorbide mononitrate (IMDUR) 30 MG 24 hr tablet Take 30 mg by mouth at bedtime.       . metoprolol (LOPRESSOR) 50 MG tablet Take 25 mg by mouth 2 (two) times daily.       . Multiple Vitamin (MULTIVITAMIN WITH MINERALS) TABS Take 1 tablet by mouth daily.      Marland Kitchen omeprazole (PRILOSEC) 20 MG capsule TAKE 1 CAPSULE (20 MG TOTAL) BY MOUTH 2 (TWO) TIMES DAILY.  180 capsule  3  . oxyCODONE-acetaminophen (PERCOCET/ROXICET) 5-325 MG per tablet Take 2 tablets by mouth every 4 (four) hours as needed for pain.      . potassium chloride SA (K-DUR,KLOR-CON) 20 MEQ tablet Take 20 mEq by mouth 2 (two)  times daily.      . promethazine (PHENERGAN) 25 MG tablet Take 25 mg by mouth every 6 (six) hours as needed for nausea.      . Simethicone (GAS-X PO) Take 1 tablet by mouth daily as needed (gas).      . simvastatin (ZOCOR) 20 MG tablet Take 20 mg by mouth at bedtime.       . vitamin B-12 (CYANOCOBALAMIN) 1000 MCG tablet Take 1,000 mcg by mouth daily.       Marland Kitchen warfarin (COUMADIN) 5 MG tablet Take 0.5-1 tablets (2.5-5 mg total) by mouth daily. 5mg  Sunday, Tuesday, Thursday, and Saturday   2.5mg  on monday,wednesday,friday  90 tablet  3   No current facility-administered medications for this visit.    No Known Allergies  History   Social History  . Marital Status: Married    Spouse Name: Baldo Ash    Number of Children: N/A  . Years of Education: N/A   Occupational History  . Retired   .     Social History Main Topics  . Smoking status: Former Smoker -- 1.00 packs/day for 22 years    Types: Cigarettes    Quit date: 07/20/1984  . Smokeless tobacco: Never Used  . Alcohol Use: No  . Drug Use: No  . Sexually Active: Not on file   Other Topics Concern  . Not on file   Social History Narrative  . No narrative on file     Review of Systems: General: negative for chills, fever, night sweats or weight changes.  Cardiovascular: negative for chest pain, dyspnea on exertion, edema, orthopnea, palpitations, paroxysmal nocturnal dyspnea or shortness of breath Dermatological: negative for rash Respiratory: negative for cough or wheezing Urologic: negative for hematuria Abdominal: negative for nausea, vomiting, diarrhea, bright red blood per rectum, melena, or hematemesis Neurologic: negative for visual changes, syncope, or dizziness All other systems reviewed and are otherwise negative except as noted above.    Blood pressure 126/76, pulse 88, height 5' 7.5" (1.715 m), weight 243 lb 11.2 oz (110.542 kg).  General appearance: alert and no distress Neck: no adenopathy, no carotid bruit, no  JVD, supple, symmetrical, trachea midline and thyroid not enlarged, symmetric, no tenderness/mass/nodules Lungs: clear to auscultation bilaterally Heart: irregularly irregular rhythm Extremities: extremities normal, atraumatic, no cyanosis or edema  EKG not performed today  ASSESSMENT AND PLAN:   atrial fib, permanent on coumadin for anticoagulation Rate controlled on Coumadin anticoagulation. She did have a vent monitor that showed pauses as well as 2.3 seconds which did  not correlate with episodes of syncope. It was felt that her syncope may be related to orthostatic hypotension.  CAD (coronary artery disease), with CABG in 1997 and negative Nuc. 01/2012 Status post coronary artery bypass grafting in 1997 with pre-cath performed 6/11 revealing patent grafts. Her last Myoview stress test performed 01/19/12 was nonischemic.  HYPERLIPIDEMIA Followed by her primary care physician.      Runell Gess MD FACP,FACC,FAHA, [facility] 12/28/2012 11:44 AM

## 2012-12-30 ENCOUNTER — Telehealth: Payer: Self-pay | Admitting: Pulmonary Disease

## 2012-12-30 MED ORDER — LEVOFLOXACIN 500 MG PO TABS: 500.0000 mg | ORAL_TABLET | Freq: Every day | ORAL | Status: DC

## 2012-12-30 NOTE — Telephone Encounter (Signed)
Per SN---  levaquin 500 mg  #7  1 daily  Until gone with 1 refill.   This rx has been sent to the pharmacy and the pt is aware.

## 2012-12-30 NOTE — Telephone Encounter (Signed)
Called and spoke with pt and she stated that she has a cough and pain in her back.  Cough with gree/yellow at times. Going on for about 2 days.  Pt is requesting something be called in for her.  SN please advise. Thanks  No Known Allergies

## 2012-12-30 NOTE — Telephone Encounter (Signed)
Pt called back. Wanted to make sure she hears from [name] / nurse before closing. [name]

## 2013-01-06 ENCOUNTER — Encounter (HOSPITAL_COMMUNITY): Payer: Medicare Other

## 2013-01-15 ENCOUNTER — Other Ambulatory Visit: Payer: Self-pay | Admitting: Pulmonary Disease

## 2013-01-16 ENCOUNTER — Other Ambulatory Visit: Payer: Self-pay | Admitting: *Deleted

## 2013-01-16 MED ORDER — ISOSORBIDE MONONITRATE ER 30 MG PO TB24: 30.0000 mg | ORAL_TABLET | Freq: Every day | ORAL | Status: DC

## 2013-01-16 NOTE — Telephone Encounter (Signed)
Refills authorized for isosorbide

## 2013-01-18 ENCOUNTER — Ambulatory Visit: Payer: Medicare Other | Admitting: Pharmacist Clinician (PhC)/ Clinical Pharmacy Specialist

## 2013-01-23 ENCOUNTER — Encounter: Payer: Self-pay | Admitting: Pulmonary Disease

## 2013-01-23 ENCOUNTER — Ambulatory Visit (INDEPENDENT_AMBULATORY_CARE_PROVIDER_SITE_OTHER): Payer: Medicare Other | Admitting: Pulmonary Disease

## 2013-01-23 VITALS — BP 150/70 | HR 96 | Temp 97.7°F | Ht 66.0 in | Wt 247.6 lb

## 2013-01-23 DIAGNOSIS — F411 Generalized anxiety disorder: Secondary | ICD-10-CM

## 2013-01-23 DIAGNOSIS — D638 Anemia in other chronic diseases classified elsewhere: Secondary | ICD-10-CM

## 2013-01-23 DIAGNOSIS — K589 Irritable bowel syndrome without diarrhea: Secondary | ICD-10-CM

## 2013-01-23 DIAGNOSIS — M1990 Unspecified osteoarthritis, unspecified site: Secondary | ICD-10-CM

## 2013-01-23 DIAGNOSIS — K219 Gastro-esophageal reflux disease without esophagitis: Secondary | ICD-10-CM

## 2013-01-23 DIAGNOSIS — N259 Disorder resulting from impaired renal tubular function, unspecified: Secondary | ICD-10-CM

## 2013-01-23 DIAGNOSIS — M797 Fibromyalgia: Secondary | ICD-10-CM

## 2013-01-23 DIAGNOSIS — I872 Venous insufficiency (chronic) (peripheral): Secondary | ICD-10-CM

## 2013-01-23 DIAGNOSIS — I2510 Atherosclerotic heart disease of native coronary artery without angina pectoris: Secondary | ICD-10-CM

## 2013-01-23 DIAGNOSIS — M545 Low back pain, unspecified: Secondary | ICD-10-CM

## 2013-01-23 DIAGNOSIS — IMO0001 Reserved for inherently not codable concepts without codable children: Secondary | ICD-10-CM

## 2013-01-23 DIAGNOSIS — I4821 Permanent atrial fibrillation: Secondary | ICD-10-CM

## 2013-01-23 DIAGNOSIS — I4891 Unspecified atrial fibrillation: Secondary | ICD-10-CM

## 2013-01-23 DIAGNOSIS — E785 Hyperlipidemia, unspecified: Secondary | ICD-10-CM

## 2013-01-23 DIAGNOSIS — R3989 Other symptoms and signs involving the genitourinary system: Secondary | ICD-10-CM

## 2013-01-23 DIAGNOSIS — J449 Chronic obstructive pulmonary disease, unspecified: Secondary | ICD-10-CM

## 2013-01-23 NOTE — Progress Notes (Signed)
Subjective:    Patient ID: [name], female    DOB: [birth year], 72 y.o.   MRN: [id number]  HPI 72 y/o WF here for a follow up visit... she has ASHD w/ prev CABG and chr AFib on Coumadin followed by DrBerry... she also has FM/ chr pain syndrome followed by [name]... we have followed her primarily for COPD/ Asthmatic Bronchitis- see below...  ~  July 28, 2011:  36mo ROV & she has persistent AB symptoms despite Augmentin & Pred Rx called in> she knows that she must improve diet, exercise & get wt down... Also c/o aching all over, soreness, tender, HAs, etc c/w her FM & mult somatic complaints...  She saw DrBerry 10/12 for f/u CAD, s/p CABG 1997, AFib on Coumadin & rate control, Hyperlipidemia> she had false pos Myoview in 2009 & subseq cath that showed patent grafts & norm LVF; he felt she was stable & rec same meds, f/u 51yr... See prob list>>   ~  December 22, 2011:  62mo ROV & [name] is c/o aches & pains from her FM; she saw [name] for Ortho & had shots in her knee but told ?she was too high risk for surg? We discussed this & I offered her second opinion consults w/ Ortho specialists at [facility]) or [name]) if she wants...     She saw [name] for [facility] 2/13> chr AFib, on Coumadin & ASA81mg /d, plus Metop25Bid/ ImDur30/ Lasix40Bid;  He added in the LANOXIN 0.25mg /d to help her ventric response==> she did not bring her med bottles or an updated list of her meds, it is unclear if she is still taking the Lanoxin per [facility]...      We reviewed prob list, meds, xrays and labs> see below>> LABS 6/13:  FLP- at goals on Simva20;  Chems- ok w/ Creat=1.7;  CBC- ok;  TSH=1.46;  BNP=179; A1c=5.9  ~  May 24, 2012:  62mo ROV & [name] was [place] by [facility] 9/19 - 04/13/12 for weakness, dizzy, syncope in the setting of hypotension & acute anemia w/ GIbleed while on Coumadin> he specific numbers were BP=90/40, Hg=7.3, stools pos for blood, INR=2.19;  She received 2u blood, FFP, VitK;  She had EGD= neg  (intact Nissen) & Colonoscopy= neg, no bleeding source found;  Coumadin was help then restarted;  Only med change was incr Metoprolol 25mg  tabs- take 3tabs Bid...  Since disch she saw TP 10/13> doing satis & Hg= 11.3;  She had f/u [facility] 10/13> stable chr AFib w/ controlled VR, same meds, no changes made...    COPD> on AlbutNEBS, Symbicort160, Mucinex; breating is stable & chest clear at present, RR=18, O2sat=94% on RA...    Cards> CAD, s/p CABG, chr AFib> on Imdur, Metoprolol, Lasix, KCl, Coumadin; [facility] & [facility] are [facility]...    Chol> on Simva20; last FLP at goals- continue same, get on diet, get wt down...    Obesity> she has gone 255 ==> 236 ==> 245 today; we reviewed diet, exercise, wt reduction strategies...    Renal Insuffic> prev labs in the 1.5 to 1.7 range, sl better during her Hosp reading 1.2 to 1.3, but now back to 1.6=>1.8 on her same outpt meds...    Rheum> DJD, Compression T4 & L2, chr pain syndrome, FM> she is followed by [name], DrTooke, [name]; on Percocet Tid Prn & Neurontin 600mg Bid;  Also takes Amitriptyline 50mg - 2hs & Flexeril 10 Tid Prn...    Anxiety> on Alprazolam 0.5mg  Tid as needed... We reviewed  prob list, meds, xrays and labs> see below for updates >> she had Flu vaccine 9/13... LABS 11/13:  Chems-  Ok x mild renal insuffic w/ BUN=25, Creat=1.8;  CBC- mild anemia w/ Hg=10.7, Fe=88 (21%)...  ~  October 24, 2012:  28mo ROV & [name] was [facility] 12/13 for Left TKR by Hayden Rasmussen & she did well w/ Lovenox bridging & no medical complications...  She went to the ER 4/14 w/ syncopal spell at home when she got up to go to the bathroom hitting her head & right arm;  ER eval was neg XRays were neg, and CTHead showed mild diffuse atrophy & vol loss w/ microvasc ischemic dis, no acute intracranial process;  Labs revealed Hg=10.8, Cr=1.66, Protime=27.3/ INR=2.69; Urine cloudy w/ WBCs (given Rocephin=>Keflex)...  We discussed checking MRI Brain and CDopplers;  She  also needs f/u w/ Cards to r/o cardiac arrhythmia etc...     We reviewed prob list, meds, xrays and labs> see below for updates >> she reports that her insurance co requires change of Flexeril10 to Zanaflex4mg , and Phenergan25 to Zofran4mg ... LABS 3-4/14:  FLP- looks good on Simva20;  Chems- ok x Creat=1.66;  CBC- Hg~11, MCV=91, & Fe=62 (17%);  TSH=2.88... CTHead 4/14 showed mild diffuse atrophy & vol loss w/ microvasc ischemic dis, no acute intracranial process... MRI Brain & CDopplers have been ordered & pending... Cardiac f/u appt w/ DrBerry...  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 She remains on ASA81 + Coumadin via [facility];  Meds were adjusted- see below;  She had recent f/u [facility], DrBerry> s/p CABG 1997 w/ patent grafts on cath 2011 and non-ischemic myoview 7/13; AFib rate controlled on Metop & anticoag on coumadin; BP controlled on meds w/o postural drop...     She had a bronchitic exac 6/14 & we called in Levaquin w/ resolution of her symptoms & back to baseline now...     Her CC= hurts all over w/ pain in knees esp; she is followed by.DrBeekman for Rheum on Oxycodone which he writes; she notes that she has Paget's in a LE bone & DrB wants to Rx w/ "shots" but she has declined & recalls DrRowe's prev advise; we do not have notes from Rheum to review...  We reviewed prob list, meds, xrays and labs> see below for updates >>             Problem List:  COPD (ICD-496) - ex smoker, quit 54yrs... uses nebulizer w/  ALBUTEROL Qid (using 1-2 daily) vs Proventil HFA & MUCINEX 1-2 Bid w/ fluids... baseline CXR shows prev CABG surg, borderline Cor, NAD... prev PFT's 12/05 showed more restriction (from effort and obesity) than obstruction... ~  CXR 10/09 in hosp showed biapical pleural thickening, NAD. ~  5/10:  bronchitic exas treated w/ Levaquin/ Medrol/ etc... 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 ~  12/10:  another exac requiring Levaquin/ Pred... we will add SYMBICORT160- 2sp Bid. ~  Improved w/ addition of Symbicort,  but still c/o occas exac requiring antibiotics etc; she stopped the Symbicort on her own... ~  CXR 6/11 showed mild cardiomeg, s/p CABG, calcif Ao, clear/ NAD.Marland Kitchen. ~  CXR 1-2/13 showed borderline heart size, s/p CABG, lungs clear w/ min peribronch thickening, DJD spine, osteopenia... ~  PFT 6/13 showed FVC=2.01 (59%), FEV1=1.62 (64%), %1sec=81, mid-flows=81%pred==> more restricted than obstructed; asked to contue meds & LOSE WEIGHT! ~  CXR 9/13 showed normal heart size, s/p CABG, sl pulm vasc congestion, NAD.Marland Kitchen. ~  CXR 12/13 showed borderline heart size, post op bypass changes, chr bronchitic interstitial lung changes w/o acute dis... ~  7/14: breathing is stable on Albut NEBS, Mucinex, & Levaquin recently called in for bronchitis; she declines to restart Symbicort...  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  on IMDUR 30mg /d, METOPROLOL50-1/2Bid, LASIX 40mg Bid, and K20Bid... ~  NuclearStressTests in 8/05 showing infer scar and mild inferolat ischemia...  ~  2DEcho 10/07 showed EF=45-50% and DD...  ~  repeat Myoview in 9/08 showed no ischemia... ~  hosp 10/09 w/ CP- cath showed 90%proxLAD, normCIRC, subtotal midRCA, LIMA & 2 vein grafts patent;  LVF= 60% w/o regional wall motion abn... no change from 2004. ~  repeat cath 6/11 showed no change from 2009 and patent grafts... ~  Labs 6/13 look good & BNP= 179... ~  [facility] 9/13 w/ anemia & GI bleed >  labs reviewed; DrKelly incr her Metoprolol to 75mg  Bid... ~  [facility] 12/13 by DrACollins for left TKR... ~  7/14: on ASA81, Metop50-1/2Bid, Imdur30, Lasix40Bid, K20Bid; BP= 150/70 & she denies CP, palpit, ch in SOB/ edema...  ATRIAL FIBRILLATION (ICD-427.31) - on COUMADIN per DrBerry... ~  4/11: complic after epidural shot w/ large ecchymosis right lumbar area, PT INR was >6, managed by [facility]. ~  9/13:  Hosp by [facility] w/ anemia (Hg=7.3), pos stools, INR=2.19; Coumadin held transiently, Tx 2u +FFP & VitK; EGD & Colon neg; Coumadin restarted; DrKelly increased her Metoprolol to 75mg Bid... ~  EKG 4/14 showed AFib, rate93, NSSTTWA...   VENOUS INSUFFICIENCY (ICD-459.81) - she follows low sodium diet. elevates legs, wears support hose occas... she is on LASIX 40mg Bid per Cards... ~  9/12: [name] ordered VenDopplers- neg for DVT...  HYPERLIPIDEMIA (ICD-272.4) - on ZOCOR 20mg /d... ~  FLP 2/09 showed TChol 112, TG 76, HDL 43, LDL 54 ~  FLP 3/10 showed TChol 166, TG 137, HDL 40, LDL 98 ~  FLP 10/10 showed TChol 120, Tg 91, HDL 47, LDL 55 ~  12/11 & 4/78: not fasting for today's OV's... ~  4/12: note from [facility] showed TChol 99, TG 68, HDL 43, LDL 42 on Simva20. ~  FLP 2/95 on Simva20 showed TChol 110, TG 98, HDL 47, LDL 43 ~  FLP on 6/13 Simva20 showed TChol 104, TG 96, HDL 50, LDL 35 ~  FLP 3/14 on Simva20 showed TChol 81, TG 85, HDL 34, LDL 30  MORBID OBESITY (ICD-278.01) - we have discussed diet and exercise program on numerous occas in the past and again today! ~  weight 2/10 = 250# ~  weight 7/10 = 262# ~  weight 8/10 = 245# ~  weight 12/10 = 266# "it's a fighting battle" ~  weight 4/11 = 262# ~  weight 8/11 = 256# ~  weight 12/11 = 263#... we reviewed diet + exercise thereapy. ~  Weight 6/12 = 271# ~  Weight 1/13 = 266# ~  Weight 6/13 = 255# ~  Weight 11/13 = 245# ~  Weight 4/14 = 256#  GERD (ICD-530.81) - on OMEPRAZOLE 20mg Bid... ?[facility] changed to Aciphex? ~  EGD 9/13 by DrHung showed  intact Nissen fundoplication, normal esoph, gastric mucosa, and duod  IBS (ICD-564.1) - colonoscopy was 9/07 by Dorris Singh and showed hems only... 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 ~  Colonoscopy 9/13 by DrStark was normal- no divertics, AVMs, polyps, or other lesions...  RENAL INSUFFICIENCY (ICD-588.9) - Creat in the 1.6-1.7 range... ~  labs 2/09 showed BUN= 16, Creat= 1.6 ~  labs 3/10 showed BUN= 20, Creat= 1.7 ~  labs 4/11 showed BUN= 15, Creat= 1.6 ~  labs in hosp 6/11 showed BUN= 9, Creat= 1.3 ~  Labs here 6/12 showed BUN= 21, Creat= 1.6 ~  Labs 1/13 on Lasix40Bid showed BUN=15, Creat= 1.7 ~  Labs 6/13 on Lasix40Bid showed BUN= 21, Creat= 1.7 ~  Labs 9/13 in hosp showed Creat= 1.2-1.3 ~  Labs 11/13 on Lasix40Bid showed BUN= 25, Creat= 1.8 ~  Labs 3/14 on Lasix40Bid+K20Bid showed K=5.1, BUN=22, Cr=1.7  OTHER SYMPTOMS INVOLVING URINARY SYSTEM (ICD-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 she responded to Toviaz 8mg  for overact bladder symptoms==> no longer on her med list...  DEGENERATIVE JOINT DISEASE >> LOW BACK PAIN, CHRONIC - eval and treated by Tamala Julian, now DrBeekman> "they do it all now" w/ rechecks Q3-77months... she takes OXYCODONE 5/325, NEURONTIN 600mg Bid, etc... COMPRESSION FRACTURE - found to have compression T12 & L2 4/11 treated by [facility]- DrTooke... CHRONIC PAIN SYNDROME - managed by Eligha Bridegroom... FIBROMYALGIA - on AMITRIPTYLINE 50mg - 2Qhs & FLEXERIL 10mg  Tid... PAGET'S DISEASE - on Observation per [facility], Rheumatology... ~  labs 2/09 showed AlkPhos= 79 ~  labs 3/10 showed AlkPhos= 86 ~  labs 4/11 showed AlkPhos= 70 ~  labs 6/11 in hosp showed AlkPhos = 71 ~  Bone Scan 6/11 showed stable Paget's distal left tibia, compress fx L2, OA of both knees. ~  Labs 6/13 showed AlkPhos= 86, remains wnl... ~  Labs 3/14 showed AlkPhos= 108 ~  We do not have notes from Rheum to review...  SYNCOPE >> Adm  4/14 by [facility]- assoc w/ postural change by history & UTI via ER... ~  4/14:  CT Head in ER showed mild diffuse atrophy & vol loss w/ microvasc ischemic dis, no acute intracranial process... ~  4/14:  No postural BP changes in office today; she has been treated for UTI found in ER last week; we will order MRI & CDopplers; she will f/u w/ Cards... 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    ANXIETY - on ALPRAZOLAM 0.5mg Qid prn...  Hx of HERPES ZOSTER (ICD-053.9), & POSTHERPETIC NEURALGIA (ICD-053.19)  ANEMIA >>  ~  Labs 11/13 showed Hg=10.7, MCV=92, Fe=88 (21%sat)... ~  Labs 3/14 showed Hg=11.4, MCV=91, Fe=62 (17%sat)...   Past Surgical History  Procedure Laterality Date  . Vesicovaginal fistula closure w/ tah    . Cardiac catheterization  01/06/2010    RCA mid artery stenosis at 99%, LAD proximal occlusion 90%  at origin of ramus  . Nissen fundoplication    . Esophagogastroduodenoscopy  04/09/2012    Procedure: ESOPHAGOGASTRODUODENOSCOPY (EGD);  Surgeon: Theda Belfast, MD;  Location[facility] ENDOSCOPY;  Service:  Endoscopy;  Laterality: N/A;  tentatively scheduled per Blase Mess due to patients breathing problems  . Colonoscopy  04/11/2012    Procedure: COLONOSCOPY;  Surgeon: Meryl Dare, MD,FACG;  Location: [facility] ENDOSCOPY;  Service: Endoscopy;  Laterality: N/A;  . Abdominal hysterectomy  at 72 years old  . Appendectomy  about 50 years ago  . Coronary artery bypass graft  1997    LIMA to LAD, SVG to ramus, SVG to RCA  . Cholecystectomy  50 years ago  . Neck surgery  72 years old    full movement  . Right knee arthroscopy  3 years ago  . Eye surgery  2005    cataracts both eyes  . Back surgery      x5, "birdcage" and  fused in lower back  . Total knee arthroplasty  06/28/2012    Procedure: TOTAL KNEE ARTHROPLASTY;  Surgeon: Eugenia Mcalpine, MD;  Location: [facility] ORS;  Service: Orthopedics;  Laterality: Left;  . Joint replacement Left 06/28/12    knee replacement    Outpatient Encounter Prescriptions as of 01/23/2013  Medication Sig Dispense Refill  . albuterol (PROVENTIL) (2.5 MG/3ML) 0.083% nebulizer solution USE IN NEB TREATMENT UP TO FOUR TIMES A DAY AS NEEDED FOR WHEEZING...  150 mL  3  . ALPRAZolam (XANAX) 0.5 MG tablet Take 1/2 to 1 tablet by mouth four times daily as needed for nerves  120 tablet  4  . amitriptyline (ELAVIL) 50 MG tablet Take 50 mg by mouth at bedtime.      Marland Kitchen aspirin EC 81 MG tablet Take 81 mg by mouth every evening.       . Calcium Carbonate-Vitamin D (CALCIUM-VITAMIN D) 500-200 MG-UNIT per tablet Take 1 tablet by mouth 2 (two) times daily with a meal.      . docusate sodium (COLACE) 100 MG capsule Take 200 mg by mouth at bedtime.      . ferrous sulfate 325 (65 FE) MG tablet Take 325 mg by mouth every evening.      . furosemide (LASIX) 40 MG tablet Take 40 mg by mouth 2 (two) times daily.       Marland Kitchen gabapentin (NEURONTIN) 600 MG tablet Take 600 mg by mouth 4 (four) times daily.      Marland Kitchen guaiFENesin (MUCINEX) 600 MG 12 hr tablet Take 1,200 mg by mouth 2 (two) times daily.      . hydrocortisone cream 1 % Apply 1 application topically 2 (two) times daily.      . isosorbide mononitrate (IMDUR) 30 MG 24 hr tablet Take 1 tablet (30 mg total) by mouth at bedtime.  30 tablet  11  . levofloxacin (LEVAQUIN) 500 MG tablet Take 1 tablet (500 mg total) by mouth daily.  7 tablet  1  . metoprolol (LOPRESSOR) 50 MG tablet Take 25 mg by mouth 2 (two) times daily.       . Multiple Vitamin (MULTIVITAMIN WITH MINERALS) TABS Take 1 tablet by mouth daily.      Marland Kitchen omeprazole (PRILOSEC) 20 MG capsule TAKE 1 CAPSULE (20 MG TOTAL) BY MOUTH 2 (TWO) TIMES DAILY.  180 capsule  3  . oxyCODONE-acetaminophen  (PERCOCET/ROXICET) 5-325 MG per tablet Take 2 tablets by mouth every 4 (four) hours as needed for pain.      . potassium chloride SA (K-DUR,KLOR-CON) 20 MEQ tablet Take 20 mEq by mouth 2 (two) times daily.      . promethazine (PHENERGAN) 25 MG tablet Take 25 mg by mouth every 6 (six)  hours as needed for nausea.      . Simethicone (GAS-X PO) Take 1 tablet by mouth daily as needed (gas).      . simvastatin (ZOCOR) 20 MG tablet Take 20 mg by mouth at bedtime.       . vitamin B-12 (CYANOCOBALAMIN) 1000 MCG tablet Take 1,000 mcg by mouth daily.       Marland Kitchen warfarin (COUMADIN) 5 MG tablet Take 0.5-1 tablets (2.5-5 mg total) by mouth daily. 5mg  Sunday, Tuesday, Thursday, and Saturday   2.5mg  on monday,wednesday,friday  90 tablet  3   No facility-administered encounter medications on file as of 01/23/2013.    No Known Allergies  Current Medications, Allergies, Past Medical History, Past Surgical History, Family History, and Social History were reviewed in [employer] record.    Review of Systems         See HPI - all other systems neg except as noted... The patient complains of decreased hearing, dyspnea on exertion, headaches, incontinence, and depression.  The patient denies anorexia, fever, weight loss, weight gain, vision loss, hoarseness, chest pain, syncope, peripheral edema, prolonged cough, hemoptysis, abdominal pain, melena, hematochezia, severe indigestion/heartburn, hematuria, muscle weakness, suspicious skin lesions, transient blindness, difficulty walking, unusual weight change, abnormal bleeding, enlarged lymph nodes, and angioedema.    Objective:   Physical Exam     WD, Obese, 72 y/o WF in NAD... GENERAL:  Alert & oriented; pleasant & cooperative... HEENT:  [place]/AT, EOM- full, PERRLA, EACs-clear, TMs-wnl, NOSE-clear, THROAT-clear & wnl. NECK:  Supple w/ fairROM; no JVD; normal carotid impulses w/o bruits; no thyromegaly or nodules palpated; no lymphadenopathy. CHEST:   Coarse BS w/ no wheezing, no rales, no signs of consolidation... HEART:  Regular Rhythm; without murmurs/ rubs/ or gallops heard... ABDOMEN:  Obese, soft & nontender; normal bowel sounds; no organomegaly or masses detected. EXT: without deformities, mild arthritic changes; no varicose veins/ +venous insuffic/ 1+ edema. NEURO:  no focal neuro deficits... DERM:  along right lateral chest wall, under breast & over the costal margin- scarring in skin from shingles rash...  RADIOLOGY DATA:  Reviewed in the EPIC EMR & discussed w/ the patient...  LABORATORY DATA:  Reviewed in the EPIC EMR & discussed w/ the patient...   Assessment & Plan:    COPD>  Stable overall despite her chronic symptoms> continue NEBS, Mucinex, Fluids;  She declines regular use of Symbicort, I told her no more antibiotics w/o approp culture data...  ASHD/ CAD/ s/p CABG>  Followed by Marland Mcalpine [facility]; continue same meds; needs cardiac w/u for poss causes of Syncope...  AFIB>  On Coumadin per [facility], continue same Rx...  HYPERLIPIDEMIA>  FLP looks great, continue Simva20...  OBESITY>  We reviewed diet & exercise program; she must be more successful at losing weight!!!  GI Bleed>  [facility] 9/13 w/ Anemia & GI bleed; neg EGD & Colon, no source found...  Renal Insuffic>  Creat= 1.66 on her current regimen; advised to drink plenty of water & maintain hydration...  ORTHO>  LBP/ Compression fx/ FM/ Paget's/ Chr Pain Syndrome> managed by [name] at [facility] he [facility]; we discussed checking w/ DrBeekman before considering Ortho surg...  Anxiety>  On Alpraz prn...  Anemia>  She was Tx 2u in hosp 9/13; not currently on Fe therapy; Labs showed Hg= 10.7.Marland KitchenMarland Kitchen   Patient's Medications  New Prescriptions   No medications on file  Previous Medications   ALBUTEROL (PROVENTIL) (2.5 MG/3ML) 0.083% NEBULIZER SOLUTION    USE IN  NEB TREATMENT UP TO FOUR TIMES A DAY AS NEEDED FOR WHEEZING...   ALPRAZOLAM (XANAX) 0.5 MG TABLET     Take 1/2 to 1 tablet by mouth four times daily as needed for nerves   AMITRIPTYLINE (ELAVIL) 50 MG TABLET    Take 50 mg by mouth at bedtime.   ASPIRIN EC 81 MG TABLET    Take 81 mg by mouth every evening.    CALCIUM CARBONATE-VITAMIN D (CALCIUM-VITAMIN D) 500-200 MG-UNIT PER TABLET    Take 1 tablet by mouth 2 (two) times daily with a meal.   DOCUSATE SODIUM (COLACE) 100 MG CAPSULE    Take 200 mg by mouth at bedtime.   FERROUS SULFATE 325 (65 FE) MG TABLET    Take 325 mg by mouth every evening.   FUROSEMIDE (LASIX) 40 MG TABLET    Take 40 mg by mouth 2 (two) times daily.    GABAPENTIN (NEURONTIN) 600 MG TABLET    Take 600 mg by mouth 4 (four) times daily.   GUAIFENESIN (MUCINEX) 600 MG 12 HR TABLET    Take 1,200 mg by mouth 2 (two) times daily.   ISOSORBIDE MONONITRATE (IMDUR) 30 MG 24 HR TABLET    Take 1 tablet (30 mg total) by mouth at bedtime.   METOPROLOL (LOPRESSOR) 50 MG TABLET    Take 25 mg by mouth 2 (two) times daily.    MULTIPLE VITAMIN (MULTIVITAMIN WITH MINERALS) TABS    Take 1 tablet by mouth daily.   OMEPRAZOLE (PRILOSEC) 20 MG CAPSULE    TAKE 1 CAPSULE (20 MG TOTAL) BY MOUTH 2 (TWO) TIMES DAILY.   OXYCODONE-ACETAMINOPHEN (PERCOCET/ROXICET) 5-325 MG PER TABLET    Take 2 tablets by mouth every 4 (four) hours as needed for pain.   POTASSIUM CHLORIDE SA (K-DUR,KLOR-CON) 20 MEQ TABLET    Take 20 mEq by mouth 2 (two) times daily.   PROMETHAZINE (PHENERGAN) 25 MG TABLET    Take 25 mg by mouth every 6 (six) hours as needed for nausea.   SIMETHICONE (GAS-X PO)    Take 1 tablet by mouth daily as needed (gas).   SIMVASTATIN (ZOCOR) 20 MG TABLET    Take 20 mg by mouth at bedtime.    WARFARIN (COUMADIN) 5 MG TABLET    Take 0.5-1 tablets (2.5-5 mg total) by mouth daily. 5mg  Sunday, Tuesday, Thursday, and Saturday   2.5mg  on monday,wednesday,friday  Modified Medications   No medications on file  Discontinued Medications   HYDROCORTISONE CREAM 1 %    Apply 1 application topically 2 (two) times  daily.   LEVOFLOXACIN (LEVAQUIN) 500 MG TABLET    Take 1 tablet (500 mg total) by mouth daily.   VITAMIN B-12 (CYANOCOBALAMIN) 1000 MCG TABLET    Take 1,000 mcg by mouth daily.

## 2013-01-23 NOTE — Patient Instructions (Addendum)
Today we updated your med list in our EPIC system...    Continue your current medications the same...  Call for any questions...  Let's plan a follow up visit in 4mo, sooner if needed for problems...   

## 2013-01-28 ENCOUNTER — Other Ambulatory Visit: Payer: Self-pay | Admitting: Pulmonary Disease

## 2013-01-31 ENCOUNTER — Other Ambulatory Visit: Payer: Self-pay | Admitting: Cardiovascular Disease

## 2013-01-31 NOTE — Telephone Encounter (Signed)
Rx was sent to pharmacy electronically. 

## 2013-02-03 ENCOUNTER — Telehealth: Payer: Self-pay | Admitting: Pulmonary Disease

## 2013-02-03 MED ORDER — METHYLPREDNISOLONE (PAK) 4 MG PO TABS: ORAL_TABLET | ORAL | Status: DC

## 2013-02-03 MED ORDER — AMOXICILLIN-POT CLAVULANATE 875-125 MG PO TABS: 1.0000 | ORAL_TABLET | Freq: Two times a day (BID) | ORAL | Status: DC

## 2013-02-03 NOTE — Telephone Encounter (Signed)
Per SN---  Call in augmentin [phone] #[phone]  1 po bid mucinex 600 mg  2 po bid with plenty of fluids Align once daily Medrol dosepak  #1  Take as directed.   Spoke with pt and she is aware of meds that have been sent to the pharmacy.  Nothing further is needed.

## 2013-02-03 NOTE — Telephone Encounter (Signed)
Last OV 01/23/13. Pt c/o having dry cough, increased SOB, wheezing, chest tightness, and chest congestion x 5 days. Pt has not tried any OTC meds. Carron Curie, CMA No Known Allergies

## 2013-02-23 ENCOUNTER — Ambulatory Visit (INDEPENDENT_AMBULATORY_CARE_PROVIDER_SITE_OTHER): Payer: Medicare Other | Admitting: Pharmacist Clinician (PhC)/ Clinical Pharmacy Specialist

## 2013-02-23 DIAGNOSIS — I4821 Permanent atrial fibrillation: Secondary | ICD-10-CM

## 2013-02-23 DIAGNOSIS — Z7901 Long term (current) use of anticoagulants: Secondary | ICD-10-CM

## 2013-02-23 DIAGNOSIS — I4891 Unspecified atrial fibrillation: Secondary | ICD-10-CM

## 2013-02-23 LAB — POCT INR: INR: 2.4

## 2013-03-08 ENCOUNTER — Other Ambulatory Visit: Payer: Self-pay | Admitting: Pulmonary Disease

## 2013-03-13 ENCOUNTER — Other Ambulatory Visit: Payer: Self-pay | Admitting: Pulmonary Disease

## 2013-03-13 DIAGNOSIS — Z1231 Encounter for screening mammogram for malignant neoplasm of breast: Secondary | ICD-10-CM

## 2013-03-16 ENCOUNTER — Ambulatory Visit (HOSPITAL_COMMUNITY): Payer: Medicare Other

## 2013-03-18 ENCOUNTER — Other Ambulatory Visit: Payer: Self-pay | Admitting: Pulmonary Disease

## 2013-03-22 ENCOUNTER — Emergency Department (HOSPITAL_COMMUNITY): Payer: Medicare Other

## 2013-03-22 ENCOUNTER — Emergency Department (HOSPITAL_COMMUNITY)
Admission: EM | Admit: 2013-03-22 | Discharge: 2013-03-22 | Disposition: A | Discharge status: Home / Self Care | Payer: Medicare Other | Attending: Emergency Medicine | Admitting: Emergency Medicine

## 2013-03-22 ENCOUNTER — Encounter (HOSPITAL_COMMUNITY): Payer: Self-pay

## 2013-03-22 DIAGNOSIS — Z87891 Personal history of nicotine dependence: Secondary | ICD-10-CM | POA: Insufficient documentation

## 2013-03-22 DIAGNOSIS — Y92009 Unspecified place in unspecified non-institutional (private) residence as the place of occurrence of the external cause: Secondary | ICD-10-CM | POA: Insufficient documentation

## 2013-03-22 DIAGNOSIS — K219 Gastro-esophageal reflux disease without esophagitis: Secondary | ICD-10-CM | POA: Insufficient documentation

## 2013-03-22 DIAGNOSIS — S72109A Unspecified trochanteric fracture of unspecified femur, initial encounter for closed fracture: Secondary | ICD-10-CM | POA: Insufficient documentation

## 2013-03-22 DIAGNOSIS — Z7982 Long term (current) use of aspirin: Secondary | ICD-10-CM | POA: Insufficient documentation

## 2013-03-22 DIAGNOSIS — Z951 Presence of aortocoronary bypass graft: Secondary | ICD-10-CM | POA: Insufficient documentation

## 2013-03-22 DIAGNOSIS — I4891 Unspecified atrial fibrillation: Secondary | ICD-10-CM | POA: Insufficient documentation

## 2013-03-22 DIAGNOSIS — R296 Repeated falls: Secondary | ICD-10-CM | POA: Insufficient documentation

## 2013-03-22 DIAGNOSIS — M129 Arthropathy, unspecified: Secondary | ICD-10-CM | POA: Insufficient documentation

## 2013-03-22 DIAGNOSIS — Z96659 Presence of unspecified artificial knee joint: Secondary | ICD-10-CM | POA: Insufficient documentation

## 2013-03-22 DIAGNOSIS — Z8701 Personal history of pneumonia (recurrent): Secondary | ICD-10-CM | POA: Insufficient documentation

## 2013-03-22 DIAGNOSIS — I2510 Atherosclerotic heart disease of native coronary artery without angina pectoris: Secondary | ICD-10-CM | POA: Insufficient documentation

## 2013-03-22 DIAGNOSIS — S72101A Unspecified trochanteric fracture of right femur, initial encounter for closed fracture: Secondary | ICD-10-CM

## 2013-03-22 DIAGNOSIS — Z79899 Other long term (current) drug therapy: Secondary | ICD-10-CM | POA: Insufficient documentation

## 2013-03-22 DIAGNOSIS — Z9861 Coronary angioplasty status: Secondary | ICD-10-CM | POA: Insufficient documentation

## 2013-03-22 DIAGNOSIS — J449 Chronic obstructive pulmonary disease, unspecified: Secondary | ICD-10-CM | POA: Insufficient documentation

## 2013-03-22 DIAGNOSIS — E785 Hyperlipidemia, unspecified: Secondary | ICD-10-CM | POA: Insufficient documentation

## 2013-03-22 DIAGNOSIS — Z8619 Personal history of other infectious and parasitic diseases: Secondary | ICD-10-CM | POA: Insufficient documentation

## 2013-03-22 DIAGNOSIS — G894 Chronic pain syndrome: Secondary | ICD-10-CM | POA: Insufficient documentation

## 2013-03-22 DIAGNOSIS — Y9389 Activity, other specified: Secondary | ICD-10-CM | POA: Insufficient documentation

## 2013-03-22 DIAGNOSIS — Z7901 Long term (current) use of anticoagulants: Secondary | ICD-10-CM | POA: Insufficient documentation

## 2013-03-22 DIAGNOSIS — F411 Generalized anxiety disorder: Secondary | ICD-10-CM | POA: Insufficient documentation

## 2013-03-22 DIAGNOSIS — IMO0002 Reserved for concepts with insufficient information to code with codable children: Secondary | ICD-10-CM | POA: Insufficient documentation

## 2013-03-22 DIAGNOSIS — J4489 Other specified chronic obstructive pulmonary disease: Secondary | ICD-10-CM | POA: Insufficient documentation

## 2013-03-22 LAB — CBC WITH DIFFERENTIAL/PLATELET
Basophils Absolute: 0.1 10*3/uL (ref 0.0–0.1)
Eosinophils Absolute: 0.4 10*3/uL (ref 0.0–0.7)
HCT: 34.5 % — ABNORMAL LOW (ref 36.0–46.0)
Lymphocytes Relative: 26 % (ref 12–46)
MCHC: 31.6 g/dL (ref 30.0–36.0)
Monocytes Relative: 7 % (ref 3–12)
Neutrophils Relative %: 52 % (ref 43–77)
Platelets: 95 10*3/uL — ABNORMAL LOW (ref 150–400)
RDW: 18.6 % — ABNORMAL HIGH (ref 11.5–15.5)
WBC: 3 10*3/uL — ABNORMAL LOW (ref 4.0–10.5)

## 2013-03-22 LAB — PROTIME-INR
INR: 1.82 — ABNORMAL HIGH (ref 0.00–1.49)
Prothrombin Time: 20.5 seconds — ABNORMAL HIGH (ref 11.6–15.2)

## 2013-03-22 LAB — BASIC METABOLIC PANEL
BUN: 27 mg/dL — ABNORMAL HIGH (ref 6–23)
Chloride: 96 mEq/L (ref 96–112)
GFR calc Af Amer: 41 mL/min — ABNORMAL LOW (ref >90)
GFR calc non Af Amer: 35 mL/min — ABNORMAL LOW (ref >90)
Potassium: 4.9 mEq/L (ref 3.5–5.1)
Sodium: 137 mEq/L (ref 135–145)

## 2013-03-22 MED ORDER — ONDANSETRON HCL 4 MG/2ML IJ SOLN
4.0000 mg | INTRAMUSCULAR | Status: AC
  Administered 2013-03-22: 4 mg via INTRAVENOUS
  Filled 2013-03-22: qty 2

## 2013-03-22 MED ORDER — HYDROMORPHONE HCL PF 1 MG/ML IJ SOLN
1.0000 mg | Freq: Once | INTRAMUSCULAR | Status: AC
  Administered 2013-03-22: 1 mg via INTRAVENOUS
  Filled 2013-03-22: qty 1

## 2013-03-22 NOTE — ED Provider Notes (Signed)
CSN: [id number]     Arrival date & time 03/22/13  1548 History   First MD Initiated Contact with Patient 03/22/13 1611     Chief Complaint  Patient presents with  . Hip Pain   (Consider location/radiation/quality/duration/timing/severity/associated sxs/prior Treatment) HPI Comments: Patient is a 72 y/o female who presents for R hip pain x 2 days, worsening since this afternoon, after a mechanical fall at home. Patient states pain has changed from an aching/throbbing pain to a sharp shooting pain. Change in pain quality came after patient reached for something in the fridge this afternoon. Patient was ambulatory immediately following her fall and into this morning; but has been unable to ambulate since her pain quality changed. She states that she became very lightheaded with her worsening pain as well as diaphoretic; patient felt as though she was going to pass out. She denies any hx of hip replacements as well as associated lower extremity swelling, pallor, numbness/tingling, extremity weakness, and back pain. Patient currently on warfarin for A fib.  The history is provided by the patient. No language interpreter was used.   Past Medical History  Diagnosis Date  . Coronary atherosclerosis of unspecified type of vessel, native or graft   . Atrial fibrillation 01/19/2012    stress test - non-gated secondary to A fib,  normal myocaridal perfusion study  . Unspecified venous (peripheral) insufficiency   . Other and unspecified hyperlipidemia   . Morbid obesity   . Esophageal reflux   . Irritable bowel syndrome   . Other symptoms involving urinary system(788.99)   . Chronic pain syndrome   . 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 Anxiety state, unspecified   . Herpes zoster without mention of complication   . Herpes zoster with other nervous system complications(053.19)   . Lumbago   . Anticoagulated on Coumadin, chronic for A. Fib 04/08/2012  . COPD (chronic obstructive  pulmonary disease)   . 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 Pneumonia 4 years ago  . Bronchitis, chronic   . Fibromyalgia   . Arthritis   . CAD (coronary artery disease) 04/29/2006    echo -EF 45-50%, impaired LV relaxation  . 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 GI bleed 04/07/2012    endoscopy/colonoscopy unremarkable  . 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 DJD (degenerative joint disease)    Past Surgical History  Procedure Laterality Date  . Vesicovaginal fistula closure w/ tah    . Cardiac catheterization  01/06/2010    RCA mid artery stenosis at 99%, LAD proximal occlusion 90%  at origin of ramus  . Nissen fundoplication    . Esophagogastroduodenoscopy  04/09/2012    Procedure: ESOPHAGOGASTRODUODENOSCOPY (EGD);  Surgeon: Theda Belfast, MD;  Location: [facility] ENDOSCOPY;  Service: Endoscopy;  Laterality: N/A;  tentatively scheduled per Blase Mess due to patients breathing problems  . Colonoscopy  04/11/2012    Procedure: COLONOSCOPY;  Surgeon: Meryl Dare, MD,FACG;  Location: [facility] ENDOSCOPY;  Service: Endoscopy;  Laterality: N/A;  . Abdominal hysterectomy  at 72 years old  . Appendectomy  about 50 years ago  . Coronary artery bypass graft  1997    LIMA to LAD, SVG to ramus, SVG to RCA  . Cholecystectomy  50 years ago  . Neck surgery  72 years old    full movement  . Right knee arthroscopy  3 years ago  . Eye surgery  2005    cataracts both eyes  . Back surgery      x5, "birdcage" and fused in lower back  . Total knee arthroplasty  06/28/2012    Procedure: TOTAL KNEE ARTHROPLASTY;  Surgeon: Eugenia Mcalpine, MD;  Location: [facility] ORS;  Service: Orthopedics;  Laterality: Left;  . Joint replacement Left 06/28/12    knee replacement   Family History  Problem Relation Age of Onset  . Heart disease Mother   . Lung disease Father   . Kidney disease     History  Substance Use Topics  . Smoking status: Former  Smoker -- 1.00 packs/day for 22 years    Types: Cigarettes    Quit date: 07/20/1984  . Smokeless tobacco: Never Used  . Alcohol Use: No   OB History   Grav Para Term Preterm Abortions TAB SAB Ect Mult Living                 Review of Systems  Constitutional: Negative for fever.  Musculoskeletal: Positive for arthralgias and gait problem. Negative for back pain.  Neurological: Negative for weakness and numbness.  All other systems reviewed and are negative.   Allergies  Review of patient's allergies indicates no known allergies.  Home Medications   Current Outpatient Rx  Name  Route  Sig  Dispense  Refill  . albuterol (PROVENTIL) (2.5 MG/3ML) 0.083% nebulizer solution      USE IN NEB TREATMENT UP TO FOUR TIMES A DAY AS NEEDED FOR WHEEZING...   150 mL   3   . ALPRAZolam (XANAX) 0.5 MG tablet      Take 1/2 to 1 tablet by mouth four times daily as needed for nerves   120 tablet   4   . amitriptyline (ELAVIL) 50 MG tablet   Oral   Take 50 mg by mouth at bedtime.         Marland Kitchen aspirin EC 81 MG tablet   Oral   Take 81 mg by mouth every evening.          . Calcium Carbonate-Vitamin D (CALCIUM-VITAMIN D) 500-200 MG-UNIT per tablet   Oral   Take 1 tablet by mouth 2 (two) times daily with a meal.         . docusate sodium (COLACE) 100 MG capsule   Oral   Take 200 mg by mouth at bedtime.         . ferrous sulfate 325 (65 FE) MG tablet   Oral   Take 325 mg by mouth every evening.         . furosemide (LASIX) 40 MG tablet   Oral   Take 40 mg by mouth 2 (two) times daily.          Marland Kitchen gabapentin (NEURONTIN) 600 MG tablet   Oral   Take 600 mg by mouth 4 (four) times daily.         Marland Kitchen guaiFENesin (MUCINEX) 600 MG 12 hr tablet   Oral   Take 1,200 mg by mouth 2 (two) times daily.         . isosorbide mononitrate (IMDUR) 30 MG 24 hr tablet   Oral   Take 1 tablet (30 mg total) by mouth at bedtime.   30 tablet   11   . metoprolol (LOPRESSOR) 50 MG  tablet   Oral   Take 25 mg by mouth 2 (two) times daily.          . Multiple Vitamin (MULTIVITAMIN WITH MINERALS)  TABS   Oral   Take 1 tablet by mouth daily.         Marland Kitchen omeprazole (PRILOSEC) 20 MG capsule      TAKE 1 CAPSULE (20 MG TOTAL) BY MOUTH 2 (TWO) TIMES DAILY.   180 capsule   3   . oxyCODONE-acetaminophen (PERCOCET/ROXICET) 5-325 MG per tablet   Oral   Take 2 tablets by mouth every 4 (four) hours as needed for pain.         . potassium chloride SA (K-DUR,KLOR-CON) 20 MEQ tablet   Oral   Take 20 mEq by mouth 2 (two) times daily.         . simvastatin (ZOCOR) 20 MG tablet   Oral   Take 20 mg by mouth at bedtime.          Marland Kitchen warfarin (COUMADIN) 5 MG tablet   Oral   Take 0.5-1 tablets (2.5-5 mg total) by mouth daily. 5mg  Sunday, Tuesday, Thursday, and Saturday   2.5mg  on monday,wednesday,friday   90 tablet   3   . NITROSTAT 0.4 MG SL tablet      PLACE 1 TAB UNDER TONGUE EVERY 5 MINUTES TIL CHEST PAIN IS GONE.Marland KitchenUPTO 3 DOSES..NO RELIEF.GET HELP   25 tablet   10   . promethazine (PHENERGAN) 25 MG tablet   Oral   Take 25 mg by mouth every 6 (six) hours as needed for nausea.         . Simethicone (GAS-X PO)   Oral   Take 1 tablet by mouth daily as needed (gas).          BP 118/40  Pulse 96  Temp(Src) 98.7 F (37.1 C) (Oral)  Resp 24  Ht 5\' 7"  (1.702 m)  Wt 245 lb (111.131 kg)  BMI 38.36 kg/m2  SpO2 92%  Physical Exam  Nursing note and vitals reviewed. Constitutional: She is oriented to person, place, and time. She appears well-developed and well-nourished. No distress.  HENT:  Head: Normocephalic and atraumatic.  Eyes: Conjunctivae and EOM are normal. No scleral icterus.  Neck: Normal range of motion.  Cardiovascular: Normal rate, regular rhythm and intact distal pulses.   DP and PT pulses 2+ b/l; capillary refill normal  Pulmonary/Chest: Effort normal. No respiratory distress.  Musculoskeletal: Normal range of motion. She exhibits  tenderness.  Tenderness of R hip with deep palpation. Decreased ROM secondary to pain with flexion, internal rotation, and external rotation. No bony deformities palpated. No shortening or internal rotation of RLE at rest. No TTP of lumbosacral midline.  Neurological: She is alert and oriented to person, place, and time.  Skin: Skin is warm and dry. No rash noted. She is not diaphoretic. No erythema. No pallor.  Psychiatric: She has a normal mood and affect. Her behavior is normal.    ED Course  Procedures (including critical care time) Labs Review Labs Reviewed  CBC WITH DIFFERENTIAL - Abnormal; Notable for the following:    WBC 3.0 (*)    RBC 3.58 (*)    Hemoglobin 10.9 (*)    HCT 34.5 (*)    RDW 18.6 (*)    Platelets 95 (*)    All other components within normal limits  BASIC METABOLIC PANEL - Abnormal; Notable for the following:    CO2 33 (*)    Glucose, Bld 111 (*)    BUN 27 (*)    Creatinine, Ser 1.46 (*)    GFR calc non Af Amer 35 (*)    GFR calc  Af Amer 41 (*)    All other components within normal limits  PROTIME-INR - Abnormal; Notable for the following:    Prothrombin Time 20.5 (*)    INR 1.82 (*)    All other components within normal limits   Imaging Review Dg Hip Complete Right  03/22/2013   *RADIOLOGY REPORT*  Clinical Data: Right hip pain, fell yesterday  RIGHT HIP - COMPLETE 2+ VIEW  Comparison: None  Findings: Bones appear diffusely demineralized. Symmetric hip and SI joints. Prior lower lumbar fusion. No acute fracture, dislocation, or bone destruction.  IMPRESSION: No acute osseous abnormalities.   Original Report Authenticated By: Ulyses Southward, M.D.   Ct Pelvis Wo Contrast  03/22/2013   *RADIOLOGY REPORT*  Clinical Data: Right hip pain following traumatic injury.  CT PELVIS WITHOUT CONTRAST  Technique:  Multidetector CT imaging of the pelvis was performed following the standard protocol without intravenous contrast.  Comparison: None.  Findings: Postsurgical changes  are noted in the lower lumbar spine. No definitive hardware failure is seen.  Vacuum joint phenomenon is noted in the sacroiliac joints bilaterally.  There is a avulsion fracture of the greater trochanter arising from the proximal right femur.  No other significant fracture is noted within the pelvis.  Mild inflammatory changes are noted.  No other focal abnormality is seen.  IMPRESSION: Avulsion fracture of the greater trochanter in the proximal right femur.   Original Report Authenticated By: Alcide Clever, M.D.   MDM   1. Avulsion fracture of trochanter of femur, right, closed, initial encounter    72 year old female who presents for right hip pain after a mechanical fall at home yesterday. Patient well and nontoxic appearing, hemodynamically stable, and afebrile on arrival. Patient neurovascularly intact on physical exam with tenderness on deep palpation of the R hip joint as well as movement. DG hip without evidence of fracture or dislocation; CT pelvis ordered to further evaluate R hip joint which was significant for avulsion fracture of the greater trochanter of the R femur.   Patient on warfarin however no ecchymosis, hematoma or evidence of acute blood loss on physical exam. H/H stable and INR subtherapeutic at 1.8. Patient with a wheelchair at home as well as a cane and walker to allow her to perform her ADLs. Believe patient stable and appropriate for d/c with orthopedic follow up for further evaluation of fracture. Return precautions advised and patient already with script for percocet for pain control. Patient agreeable to plan with no unaddressed concerns. Patient seen also by Dr. Juleen China who is in agreement with this work up, assessment, management plan, and patient's stability for d/c.    Antony Madura, PA-C 03/22/13 1901

## 2013-03-22 NOTE — ED Notes (Addendum)
Pt c/o R hip pain after a fall yesterday.  Pain score 10/10.  Pt sts she fell while fixing a rug.  Sts pain suddenly increased today after bending at the waist to get something out of the refrigerator.  Pt sts difficulty ambulating.  Pt was able to stand and pivot to get out of car.  A & Ox4.

## 2013-03-22 NOTE — ED Notes (Signed)
Unable to discharge pt due recent administration of dilaudid.

## 2013-03-23 ENCOUNTER — Ambulatory Visit: Payer: Medicare Other | Admitting: Pharmacist Clinician (PhC)/ Clinical Pharmacy Specialist

## 2013-03-24 ENCOUNTER — Telehealth: Payer: Self-pay | Admitting: Cardiovascular Disease

## 2013-03-24 ENCOUNTER — Ambulatory Visit (INDEPENDENT_AMBULATORY_CARE_PROVIDER_SITE_OTHER): Payer: Medicare Other | Admitting: Pharmacist Clinician (PhC)/ Clinical Pharmacy Specialist

## 2013-03-24 DIAGNOSIS — I4821 Permanent atrial fibrillation: Secondary | ICD-10-CM

## 2013-03-24 DIAGNOSIS — I4891 Unspecified atrial fibrillation: Secondary | ICD-10-CM

## 2013-03-24 NOTE — Telephone Encounter (Signed)
Returning your call  .. Thanks  °

## 2013-03-24 NOTE — Telephone Encounter (Signed)
Spoke with husband, pt in a lot of pain, not very mobile.  See anticoag encounter for dosing

## 2013-03-28 ENCOUNTER — Ambulatory Visit (INDEPENDENT_AMBULATORY_CARE_PROVIDER_SITE_OTHER): Payer: Medicare Other | Admitting: Pharmacist Clinician (PhC)/ Clinical Pharmacy Specialist

## 2013-03-28 VITALS — BP 90/62 | HR 88

## 2013-03-28 DIAGNOSIS — Z7901 Long term (current) use of anticoagulants: Secondary | ICD-10-CM

## 2013-03-28 DIAGNOSIS — I4891 Unspecified atrial fibrillation: Secondary | ICD-10-CM

## 2013-03-28 DIAGNOSIS — I4821 Permanent atrial fibrillation: Secondary | ICD-10-CM

## 2013-03-28 LAB — POCT INR: INR: 3.4

## 2013-03-28 NOTE — ED Provider Notes (Signed)
Medical screening examination/treatment/procedure(s) were conducted as a shared visit with non-physician practitioner(s) and myself.  I personally evaluated the patient during the encounter.  72yF with hip pain after fall. Avulsion hx of R greater troch. NVI. Lives with husband and already have assistance devices (walkers, wheelchair, ramps, etc.) Pain control. Ortho FU.   Raeford Razor, MD 03/28/13 [phone]

## 2013-03-29 ENCOUNTER — Other Ambulatory Visit: Payer: Self-pay | Admitting: Cardiovascular Disease

## 2013-03-29 ENCOUNTER — Encounter (INDEPENDENT_AMBULATORY_CARE_PROVIDER_SITE_OTHER): Payer: Self-pay | Admitting: Ophthalmology

## 2013-03-29 ENCOUNTER — Other Ambulatory Visit: Payer: Self-pay | Admitting: Pulmonary Disease

## 2013-04-03 ENCOUNTER — Encounter (INDEPENDENT_AMBULATORY_CARE_PROVIDER_SITE_OTHER): Payer: Self-pay | Admitting: Ophthalmology

## 2013-04-27 ENCOUNTER — Ambulatory Visit (INDEPENDENT_AMBULATORY_CARE_PROVIDER_SITE_OTHER): Payer: Medicare Other | Admitting: Pharmacist Clinician (PhC)/ Clinical Pharmacy Specialist

## 2013-04-27 ENCOUNTER — Other Ambulatory Visit: Payer: Self-pay | Admitting: Pulmonary Disease

## 2013-04-27 VITALS — BP 100/58 | HR 80

## 2013-04-27 DIAGNOSIS — Z7901 Long term (current) use of anticoagulants: Secondary | ICD-10-CM

## 2013-04-27 DIAGNOSIS — I4821 Permanent atrial fibrillation: Secondary | ICD-10-CM

## 2013-04-27 DIAGNOSIS — I4891 Unspecified atrial fibrillation: Secondary | ICD-10-CM

## 2013-05-04 ENCOUNTER — Telehealth: Payer: Self-pay | Admitting: Pulmonary Disease

## 2013-05-04 MED ORDER — AMOXICILLIN-POT CLAVULANATE 875-125 MG PO TABS: 1.0000 | ORAL_TABLET | Freq: Two times a day (BID) | ORAL | Status: DC

## 2013-05-04 MED ORDER — METHYLPREDNISOLONE 4 MG PO KIT: PACK | ORAL | Status: DC

## 2013-05-04 NOTE — Telephone Encounter (Signed)
Last OV 01/23/13. Pt is c/o having chest congestion, productive cough with green phlegm, increased SOB, wheezing, and chest tightness. Pt denies any fever, body aches, chills at this time. Pt requests rx be sent. Please advise. Carron Curie, CMA

## 2013-05-04 NOTE — Telephone Encounter (Signed)
Per SN---  Call in augmentin 875 mg  #14  1 po bid Medrol dosepak  #1  Take as directed Align once daily mucinex otc 2 po bid

## 2013-05-04 NOTE — Telephone Encounter (Signed)
rx sent and pt advised of recs. Carron Curie, CMA

## 2013-05-16 ENCOUNTER — Telehealth: Payer: Self-pay | Admitting: Pulmonary Disease

## 2013-05-16 NOTE — Telephone Encounter (Signed)
Per SN---  Good womens formula MVI + antioxident vitamin ---like centrum silver for women Other pts tell SN that tumeric and elderberry helps them.

## 2013-05-16 NOTE — Telephone Encounter (Signed)
Spoke with pt-- Pt c/o increased fatigue. Pt has an appt w/ SN of 06/05/13. Will pt be getting labs to check on her fatigue? Pt requesting recs between now & her appt.  Please advise [name]. Thanks.

## 2013-05-16 NOTE — Telephone Encounter (Signed)
I spoke with pt. She wanted to know where to but those medications at that were mentioned to her earlier. I advised her local pharm or try health food store. She will do so and nothing further needed

## 2013-05-16 NOTE — Telephone Encounter (Signed)
Pt advised. Jordanny Waddington, CMA  

## 2013-05-24 ENCOUNTER — Encounter: Payer: Self-pay | Admitting: Pulmonary Disease

## 2013-05-24 ENCOUNTER — Ambulatory Visit (INDEPENDENT_AMBULATORY_CARE_PROVIDER_SITE_OTHER): Payer: Medicare Other | Admitting: Pulmonary Disease

## 2013-05-24 ENCOUNTER — Other Ambulatory Visit (INDEPENDENT_AMBULATORY_CARE_PROVIDER_SITE_OTHER): Payer: Medicare Other

## 2013-05-24 VITALS — BP 118/60 | HR 130 | Temp 99.1°F | Ht 66.0 in | Wt 218.2 lb

## 2013-05-24 DIAGNOSIS — M545 Low back pain, unspecified: Secondary | ICD-10-CM

## 2013-05-24 DIAGNOSIS — I4821 Permanent atrial fibrillation: Secondary | ICD-10-CM

## 2013-05-24 DIAGNOSIS — K219 Gastro-esophageal reflux disease without esophagitis: Secondary | ICD-10-CM

## 2013-05-24 DIAGNOSIS — R1032 Left lower quadrant pain: Secondary | ICD-10-CM

## 2013-05-24 DIAGNOSIS — J449 Chronic obstructive pulmonary disease, unspecified: Secondary | ICD-10-CM

## 2013-05-24 DIAGNOSIS — E669 Obesity, unspecified: Secondary | ICD-10-CM

## 2013-05-24 DIAGNOSIS — I4891 Unspecified atrial fibrillation: Secondary | ICD-10-CM

## 2013-05-24 DIAGNOSIS — D638 Anemia in other chronic diseases classified elsewhere: Secondary | ICD-10-CM

## 2013-05-24 DIAGNOSIS — E785 Hyperlipidemia, unspecified: Secondary | ICD-10-CM

## 2013-05-24 DIAGNOSIS — J4489 Other specified chronic obstructive pulmonary disease: Secondary | ICD-10-CM

## 2013-05-24 DIAGNOSIS — K589 Irritable bowel syndrome without diarrhea: Secondary | ICD-10-CM

## 2013-05-24 DIAGNOSIS — I2510 Atherosclerotic heart disease of native coronary artery without angina pectoris: Secondary | ICD-10-CM

## 2013-05-24 DIAGNOSIS — M797 Fibromyalgia: Secondary | ICD-10-CM

## 2013-05-24 DIAGNOSIS — G894 Chronic pain syndrome: Secondary | ICD-10-CM

## 2013-05-24 DIAGNOSIS — N259 Disorder resulting from impaired renal tubular function, unspecified: Secondary | ICD-10-CM

## 2013-05-24 DIAGNOSIS — M1990 Unspecified osteoarthritis, unspecified site: Secondary | ICD-10-CM

## 2013-05-24 DIAGNOSIS — F411 Generalized anxiety disorder: Secondary | ICD-10-CM

## 2013-05-24 DIAGNOSIS — I872 Venous insufficiency (chronic) (peripheral): Secondary | ICD-10-CM

## 2013-05-24 LAB — CBC WITH DIFFERENTIAL/PLATELET
Basophils Relative: 0.6 % (ref 0.0–3.0)
Eosinophils Relative: 7.2 % — ABNORMAL HIGH (ref 0.0–5.0)
Lymphocytes Relative: 39.6 % (ref 12.0–46.0)
Monocytes Relative: 7.6 % (ref 3.0–12.0)
Neutrophils Relative %: 45 % (ref 43.0–77.0)
Platelets: 182 10*3/uL (ref 150.0–400.0)
RBC: 3.31 Mil/uL — ABNORMAL LOW (ref 3.87–5.11)
WBC: 4 10*3/uL — ABNORMAL LOW (ref 4.5–10.5)

## 2013-05-24 LAB — BASIC METABOLIC PANEL
BUN: 16 mg/dL (ref 6–23)
CO2: 29 mEq/L (ref 19–32)
Chloride: 96 mEq/L (ref 96–112)
Creatinine, Ser: 1.3 mg/dL — ABNORMAL HIGH (ref 0.4–1.2)
Glucose, Bld: 124 mg/dL — ABNORMAL HIGH (ref 70–99)

## 2013-05-24 LAB — HEPATIC FUNCTION PANEL
ALT: 14 U/L (ref 0–35)
AST: 18 U/L (ref 0–37)
Albumin: 3.6 g/dL (ref 3.5–5.2)
Total Bilirubin: 0.9 mg/dL (ref 0.3–1.2)
Total Protein: 8.2 g/dL (ref 6.0–8.3)

## 2013-05-24 LAB — C-REACTIVE PROTEIN: CRP: 7.3 mg/dL (ref 0.5–20.0)

## 2013-05-24 LAB — SEDIMENTATION RATE: Sed Rate: 53 mm/hr — ABNORMAL HIGH (ref 0–22)

## 2013-05-24 MED ORDER — AMITRIPTYLINE HCL 50 MG PO TABS: 50.0000 mg | ORAL_TABLET | Freq: Every day | ORAL | Status: DC

## 2013-05-24 NOTE — Progress Notes (Signed)
Subjective:    Patient ID: [name], female    DOB: 29-Aug-1940, 72 y.o.   MRN: [id number]  HPI 72 y/o WF here for a follow up visit... she has ASHD w/ prev CABG and chr AFib on Coumadin followed by DrBerry... she also has FM/ chr pain syndrome followed by [name]... we have followed her primarily for COPD/ Asthmatic Bronchitis- see below...  ~  December 22, 2011:  34mo ROV & [name] is c/o aches & pains from her FM; she saw [name] for Ortho & had shots in her knee but told ?she was too high risk for surg? We discussed this & I offered her second opinion consults w/ Ortho specialists at [facility]) or Phoe[facility] Perch [facility]) if she wants...     She saw [name] for [facility] 2/13> chr AFib, on Coumadin & ASA81mg /d, plus Metop25Bid/ ImDur30/ Lasix40Bid;  He added in the LANOXIN 0.25mg /d to help her ventric response==> she did not bring her med bottles or an updated list of her meds, it is unclear if she is still taking the Lanoxin per [facility]...      We reviewed prob list, meds, xrays and labs> see below>> LABS 6/13:  FLP- at goals on Simva20;  Chems- ok w/ Creat=1.7;  CBC- ok;  TSH=1.46;  BNP=179; A1c=5.9  ~  May 24, 2012:  65mo ROV & [name] was [place] by [facility] 9/19 - 04/13/12 for weakness, dizzy, syncope in the setting of hypotension & acute anemia w/ GIbleed while on Coumadin> he specific numbers were BP=90/40, Hg=7.3, stools pos for blood, INR=2.19;  She received 2u blood, FFP, VitK;  She had EGD= neg (intact Nissen) & Colonoscopy= neg, no bleeding source found;  Coumadin was help then restarted;  Only med change was incr Metoprolol 25mg  tabs- take 3tabs Bid...  Since disch she saw TP 10/13> doing satis & Hg= 11.3;  She had f/u [facility] 10/13> stable chr AFib w/ controlled VR, same meds, no changes made...    COPD> on AlbutNEBS, Symbicort160, Mucinex; breating is stable & chest clear at present, RR=18, O2sat=94% on RA...    Cards> CAD, s/p CABG, chr AFib> on Imdur, Metoprolol, Lasix, KCl, Coumadin; [facility] & [facility] notes are reviewed...    Chol> on Simva20; last FLP at goals- continue same, get on diet, get wt down...    Obesity> she has gone 255 ==> 236 ==> 245 today; we reviewed diet, exercise, wt reduction strategies...    Renal Insuffic> prev labs in the 1.5 to 1.7 range, sl better during her Hosp reading 1.2 to 1.3, but now back to 1.6=>1.8 on her same outpt meds...    Rheum> DJD, Compression T4 & L2, chr pain syndrome, FM> she is followed by [name], DrTooke, [name]; on Percocet Tid Prn & Neurontin 600mg Bid;  Also takes Amitriptyline 50mg - 2hs & Flexeril 10 Tid Prn...    Anxiety> on Alprazolam 0.5mg  Tid as needed... We reviewed prob list, meds, xrays and labs> see below for updates >> she had Flu vaccine 9/13... LABS 11/13:  Chems-  Ok x mild renal insuffic w/ BUN=25, Creat=1.8;  CBC- mild anemia w/ Hg=10.7, Fe=88 (21%)...  ~  October 24, 2012:  4mo ROV & [name] was [facility] 12/13 for Left TKR by Hillery Aldo & she did well w/ Lovenox bridging & no medical complications...  She went to the ER 4/14 w/ syncopal spell at home when she got up to go to the bathroom hitting her head & right arm;  ER eval was neg  XRays were neg, and CTHead showed mild diffuse atrophy & vol loss w/ microvasc ischemic dis, no acute intracranial process;  Labs revealed Hg=10.8, Cr=1.66, Protime=27.3/ INR=2.69; Urine cloudy w/ WBCs (given Rocephin=>Keflex)...  We discussed checking MRI Brain and CDopplers;  She also needs f/u w/ Cards to r/o cardiac arrhythmia etc...     We reviewed prob list, meds, xrays and labs> see below for updates >> she reports that her insurance co requires change of Flexeril10 to Zanaflex4mg , and Phenergan25 to Zofran4mg ... LABS 3-4/14:  FLP- looks good on Simva20;  Chems- ok x Creat=1.66;  CBC- Hg~11, MCV=91, & Fe=62 (17%);  TSH=2.88... CTHead 4/14 showed mild diffuse atrophy & vol loss w/ microvasc ischemic dis, no acute intracranial process... MRI Brain & CDopplers have been ordered &  pending... Cardiac f/u appt w/ DrBerry...  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 She remains on ASA81 + Coumadin via [facility];  Meds were adjusted- see below;  She had recent f/u [facility], DrBerry> s/p CABG 1997 w/ patent grafts on cath 2011 and non-ischemic myoview 7/13; AFib rate controlled on Metop & anticoag on coumadin; BP controlled on meds w/o postural drop...     She had a bronchitic exac 6/14 & we called in Levaquin w/ resolution of her symptoms & back to baseline now...     Her CC= hurts all over w/ pain in knees esp; she is followed by.DrBeekman for Rheum on Oxycodone which he writes; she notes that she has Paget's in a LE bone & DrB wants to Rx w/ "shots" but she has declined & recalls DrRowe's prev advise; we do not have notes from Rheum to review...  We reviewed prob list, meds, xrays and labs> see below for updates >>   ~  May 24, 2013:  60mo ROV & [name] presents w/ LLQ pain & sl tenderness x several weeks now- she denies n/v, but is sl constip going 1-2 per wk and assoc w/ fair appetite and 29# weight loss to 218# today; GI hx includes GERD on Prilosec 20bid & she had EGD 9/13 by DrHung w/ intact Nissen & normal esoph/ gastric/ duod;  Her last colon was 9/13 by DrStark & also normal w/o divertics, polyps, or other lesions;  She had CT Pelvis 9/14 in ER w/ avulsion fracture of the greater trochanter of the proximal right femur;  Exam shows tender to palp in LLQ, no mass felt => we decided to check labs and proceed w/  CT Abd...     COPD is stable> she notes sl cough, occas green sput, stable DOE, etc; on NEBS w/ Albut Qid, Mucinex 2Bid, Fluids; recently had Augmentin & Pred called in for an exac- improved...    CAD, s/pCABG, AFib> followed by DrBerry on Coumadin, Imdur30, Metop50-1/2Bid, Lasix40Bid, K20Bid; BP= 118/60 & she notes irreg tachycardia w/ activity, no CP or ch in SOB...    Hyperlipid> on Simva20 w/ excellent numbers...    Overweight> peak wt in the 260's &  she was last 247# in July2014; today she weighs 218# for a 29# wt reduction; on questioning she isn't really dieting but avoiding sweets, eating smaller portions, & appetite is OK?    Rheum- DJD, chrLBP, compression fx, FM, Paget's, chronic pain syndrome> DrBeekman gives her Percocet, also takes Neurontin600Qid, Amitriptyine50Qhs We reviewed prob list, meds, xrays and labs> see below for updates >>  LABS 11/14:  Chems- ok x BS=124;  CBC- stable anemia w/ Hg=10.5;  Sed=53;  CRP=7.3.Marland KitchenMarland Kitchen CT Abd&Pelvis 11/14 is NEG x for 3cm cystic lesion in left inguinal region => refer to [facility] for their review...            Problem List:  COPD (ICD-496) - ex smoker, quit 13yrs... uses nebulizer w/ ALBUTEROL Qid (using 1-2 daily) vs Proventil HFA & MUCINEX 1-2 Bid w/ fluids... baseline CXR shows prev CABG surg, borderline Cor, NAD... prev PFT's 12/05 showed more restriction (from effort and obesity) than obstruction... ~  CXR 10/09 in hosp showed biapical pleural thickening, NAD. ~  5/10:  bronchitic exas treated w/ Levaquin/ Medrol/ etc... 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 ~  12/10:  another exac requiring Levaquin/ Pred... we will add SYMBICORT160- 2sp Bid. ~  Improved w/ addition of Symbicort, but still c/o occas exac requiring antibiotics etc; she stopped the Symbicort on her own... ~  CXR 6/11 showed mild cardiomeg, s/p CABG, calcif Ao, clear/ NAD.Marland Kitchen. ~  CXR 1-2/13 showed borderline heart size, s/p CABG, lungs clear w/ min peribronch thickening, DJD spine,  osteopenia... ~  PFT 6/13 showed FVC=2.01 (59%), FEV1=1.62 (64%), %1sec=81, mid-flows=81%pred==> more restricted than obstructed; asked to contue meds & LOSE WEIGHT! ~  CXR 9/13 showed normal heart size, s/p CABG, sl pulm vasc congestion, NAD.Marland Kitchen. ~  CXR 12/13 showed borderline heart size, post op bypass changes, chr bronchitic interstitial lung changes w/o acute dis... ~  7/14: breathing is stable on Albut NEBS, Mucinex, & Levaquin recently called in for bronchitis; she declines to restart Symbicort...  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  on IMDUR 30mg /d, METOPROLOL50-1/2Bid, LASIX 40mg Bid, and K20Bid... ~  NuclearStressTests in 8/05 showing infer scar and mild inferolat ischemia...  ~  2DEcho 10/07 showed EF=45-50% and DD...  ~  repeat Myoview in 9/08 showed no ischemia... ~  hosp 10/09 w/ CP- cath showed 90%proxLAD, normCIRC, subtotal midRCA, LIMA & 2 vein grafts patent;  LVF= 60% w/o regional wall motion abn... no change from 2004. ~  repeat cath 6/11 showed no change from 2009 and patent grafts... ~  Labs 6/13 look good & BNP= 179... ~  [facility] 9/13 w/ anemia & GI bleed > labs reviewed; DrKelly incr her Metoprolol to 75mg  Bid... ~  [facility] 12/13 by DrACollins for left TKR... ~  7/14: on ASA81, Metop50-1/2Bid, Imdur30, Lasix40Bid, K20Bid; BP= 150/70 & she denies CP, palpit, ch in SOB/ edema...  ATRIAL FIBRILLATION (ICD-427.31) - on COUMADIN per DrBerry... ~  4/11: complic after epidural shot w/ large ecchymosis right lumbar area, PT INR was >6, managed by [facility]. ~  9/13:  Hosp by [facility] w/ anemia (Hg=7.3), pos stools, INR=2.19; Coumadin held transiently, Tx 2u +FFP & VitK; EGD & Colon neg; Coumadin restarted; DrKelly increased her Metoprolol to 75mg Bid... ~  EKG 4/14 showed AFib, rate93, NSSTTWA...   VENOUS INSUFFICIENCY (ICD-459.81) - she follows low sodium diet. elevates legs, wears support hose occas... she is on LASIX 40mg Bid per  Cards... ~  9/12: [name] ordered VenDopplers- neg for  DVT.Marland KitchenMarland Kitchen  HYPERLIPIDEMIA (ICD-272.4) - on ZOCOR 20mg /d... ~  FLP 2/09 showed TChol 112, TG 76, HDL 43, LDL 54 ~  FLP 3/10 showed TChol 166, TG 137, HDL 40, LDL 98 ~  FLP 10/10 showed TChol 120, Tg 91, HDL 47, LDL 55 ~  12/11 & 1/61: not fasting for today's OV's... ~  4/12: note from [facility] showed TChol 99, TG 68, HDL 43, LDL 42 on Simva20. ~  FLP 0/96 on Simva20 showed TChol 110, TG 98, HDL 47, LDL 43 ~  FLP on 6/13 Simva20 showed TChol 104, TG 96, HDL 50, LDL 35 ~  FLP 3/14 on Simva20 showed TChol 81, TG 85, HDL 34, LDL 30  MORBID OBESITY (ICD-278.01) - we have discussed diet and exercise program on numerous occas in the past and again today! ~  weight 2/10 = 250# ~  weight 7/10 = 262# ~  weight 8/10 = 245# ~  weight 12/10 = 266# "it's a fighting battle" ~  weight 4/11 = 262# ~  weight 8/11 = 256# ~  weight 12/11 = 263#... we reviewed diet + exercise thereapy. ~  Weight 6/12 = 271# ~  Weight 1/13 = 266# ~  Weight 6/13 = 255# ~  Weight 11/13 = 245# ~  Weight 4/14 = 256# ~  Weight 7/14 = 247# ~  Weight 11/14 = 218#  GERD (ICD-530.81) - on OMEPRAZOLE 20mg Bid... ?[facility] changed to Aciphex? ~  EGD 9/13 by DrHung showed intact Nissen fundoplication, normal esoph, gastric mucosa, and duod  IBS (ICD-564.1) - colonoscopy was 9/07 by Dorris Singh and showed hems only... 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 ~  Colonoscopy 9/13 by DrStark was normal- no divertics, AVMs, polyps, or other lesions... ~  11/14: presents w/ LLQ pain & sl tenderness x several weeks- denies n/v, +constip going 1-2 per wk and assoc w/ fair appetite and 29# weight loss; GI hx as noted; She had CT Pelvis 9/14 in ER w/ avulsion fracture of the greater trochanter of the proximal right femur;  Exam shows tender to palp in LLQ, no mass felt => we decided to check labs and proceed w/ CT Abd => NEG x for 3cm cystic lesion in left inguinal region => refer to [facility]  for their review.  RENAL INSUFFICIENCY (ICD-588.9) - Creat in the 1.6-1.7 range... 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 she responded to Toviaz 8mg  for overact bladder symptoms==> no longer on her med list...  DEGENERATIVE JOINT DISEASE >> LOW BACK PAIN, CHRONIC - eval and treated by Tamala Julian, now DrBeekman> "they do it all now" w/ rechecks Q3-54months... she takes OXYCODONE 5/325, NEURONTIN 600mg Bid, etc... COMPRESSION FRACTURE - found to have compression T12 & L2 4/11 treated by NS- DrTooke... CHRONIC PAIN SYNDROME - managed by Eligha Bridegroom... FIBROMYALGIA - on  AMITRIPTYLINE 50mg - 2Qhs & FLEXERIL 10mg  Tid... PAGET'S DISEASE - on Observation per [facility], Rheumatology... ~  labs 2/09 showed AlkPhos= 79 ~  labs 3/10 showed AlkPhos= 86 ~  labs 4/11 showed AlkPhos= 70 ~  labs 6/11 in hosp showed AlkPhos = 71 ~  Bone Scan 6/11 showed stable Paget's distal left tibia, compress fx L2, OA of both knees. ~  Labs 6/13 showed AlkPhos= 86, remains wnl... ~  Labs 3/14 showed AlkPhos= 108 ~  We do not have notes from Rheum to review...  SYNCOPE >> Adm 4/14 by [facility]- assoc w/ postural change by history & UTI via ER... ~  4/14:  CT Head in ER showed mild diffuse atrophy & vol loss w/ microvasc ischemic dis, no acute intracranial process... ~  4/14:  No postural BP  changes in office today; she has been treated for UTI found in ER last week; we will order MRI & CDopplers; she will f/u w/ Cards... 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    ANXIETY - on ALPRAZOLAM 0.5mg Qid prn...  Hx of HERPES ZOSTER (ICD-053.9), & POSTHERPETIC NEURALGIA (ICD-053.19)  ANEMIA >>  ~  Labs 11/13 showed Hg=10.7, MCV=92, Fe=88 (21%sat)... ~  Labs 3/14 showed Hg=11.4, MCV=91, Fe=62 (17%sat)... ~  Labs 11/14 showed Hg=10.5, MCV=95   Past Surgical History  Procedure Laterality Date  . Vesicovaginal fistula closure w/ tah    . Cardiac catheterization  01/06/2010    RCA mid artery stenosis at 99%, LAD proximal occlusion 90%  at origin of ramus  . Nissen fundoplication    . Esophagogastroduodenoscopy  04/09/2012    Procedure: ESOPHAGOGASTRODUODENOSCOPY (EGD);  Surgeon: Theda Belfast, MD;  Location: [facility] ENDOSCOPY;  Service: Endoscopy;  Laterality: N/A;  tentatively scheduled per Blase Mess due to patients breathing problems  . Colonoscopy  04/11/2012    Procedure: COLONOSCOPY;  Surgeon: Meryl Dare, MD,FACG;  Location: [facility] ENDOSCOPY;  Service: Endoscopy;  Laterality: N/A;  . Abdominal hysterectomy  at 72 years old  . Appendectomy  about 50 years ago  . Coronary artery bypass graft  1997    LIMA to LAD, SVG to ramus, SVG to RCA  . Cholecystectomy  50 years ago  . Neck surgery  72 years old    full movement  . Right knee arthroscopy  3 years ago  . Eye surgery  2005    cataracts both eyes  . Back surgery      x5, "birdcage" and fused in lower back  . Total knee arthroplasty  06/28/2012    Procedure: TOTAL KNEE ARTHROPLASTY;  Surgeon: Eugenia Mcalpine, MD;  Location: [facility] ORS;  Service: Orthopedics;   Laterality: Left;  . Joint replacement Left 06/28/12    knee replacement    Outpatient Encounter Prescriptions as of 05/24/2013  Medication Sig  . albuterol (PROVENTIL) (2.5 MG/3ML) 0.083% nebulizer solution USE IN NEB TREATMENT UP TO FOUR TIMES A DAY AS NEEDED FOR WHEEZING...  . ALPRAZolam (XANAX) 0.5 MG tablet Take 1/2 to 1 tablet by mouth four times daily as needed for nerves  . amitriptyline (ELAVIL) 50 MG tablet Take 50 mg by mouth at bedtime.  Marland Kitchen amoxicillin-clavulanate (AUGMENTIN) 875-125 MG per tablet Take 1 tablet by mouth 2 (two) times  daily.  . aspirin EC 81 MG tablet Take 81 mg by mouth every evening.   . Calcium Carbonate-Vitamin D (CALCIUM-VITAMIN D) 500-200 MG-UNIT per tablet Take 1 tablet by mouth 2 (two) times daily with a meal.  . docusate sodium (COLACE) 100 MG capsule Take 200 mg by mouth at bedtime.  . ferrous sulfate 325 (65 FE) MG tablet Take 325 mg by mouth every evening.  . furosemide (LASIX) 40 MG tablet Take 40 mg by mouth 2 (two) times daily.   Marland Kitchen gabapentin (NEURONTIN) 600 MG tablet Take 600 mg by mouth 4 (four) times daily.  Marland Kitchen gabapentin (NEURONTIN) 600 MG tablet TAKE 1 TABLET FOUR TIMES A DAY  . guaiFENesin (MUCINEX) 600 MG 12 hr tablet Take 1,200 mg by mouth 2 (two) times daily.  . isosorbide mononitrate (IMDUR) 30 MG 24 hr tablet Take 1 tablet (30 mg total) by mouth at bedtime.  . methylPREDNISolone (MEDROL DOSEPAK) 4 MG tablet follow package directions 6 day pack  . metoprolol (LOPRESSOR) 50 MG tablet Take 25 mg by mouth 2 (two) times daily.   . Multiple Vitamin (MULTIVITAMIN WITH MINERALS) TABS Take 1 tablet by mouth daily.  Marland Kitchen NITROSTAT 0.4 MG SL tablet PLACE 1 TAB UNDER TONGUE EVERY 5 MINUTES TIL CHEST PAIN IS GONE.Marland KitchenUPTO 3 DOSES..NO RELIEF.GET HELP  . omeprazole (PRILOSEC) 20 MG capsule TAKE 1 CAPSULE (20 MG TOTAL) BY MOUTH 2 (TWO) TIMES DAILY.  Marland Kitchen oxyCODONE-acetaminophen (PERCOCET/ROXICET) 5-325 MG per tablet Take 2 tablets by mouth every 4 (four) hours as  needed for pain.  . potassium chloride SA (K-DUR,KLOR-CON) 20 MEQ tablet Take 20 mEq by mouth 2 (two) times daily.  . potassium chloride SA (KLOR-CON M20) 20 MEQ tablet TAKE 1 TABLET TWICE A DAY  . promethazine (PHENERGAN) 25 MG tablet Take 25 mg by mouth every 6 (six) hours as needed for nausea.  . Simethicone (GAS-X PO) Take 1 tablet by mouth daily as needed (gas).  . simvastatin (ZOCOR) 20 MG tablet Take 20 mg by mouth at bedtime.   Marland Kitchen warfarin (COUMADIN) 5 MG tablet Take 1 tablet by mouth daily or as directed    No Known Allergies   Current Medications, Allergies, Past Medical History, Past Surgical History, Family History, and Social History were reviewed in [employer] record.    Review of Systems         See HPI - all other systems neg except as noted... The patient complains of decreased hearing, dyspnea on exertion, LLQ abd pain, headaches, incontinence, and depression.  The patient denies anorexia, fever, weight loss, weight gain, vision loss, hoarseness, chest pain, syncope, peripheral edema, prolonged cough, hemoptysis, melena, hematochezia, severe indigestion/heartburn, hematuria, muscle weakness, suspicious skin lesions, transient blindness, difficulty walking, unusual weight change, abnormal bleeding, enlarged lymph nodes, and angioedema.    Objective:   Physical Exam     WD, Obese, 72 y/o WF in NAD... GENERAL:  Alert & oriented; pleasant & cooperative... HEENT:  /AT, EOM- full, PERRLA, EACs-clear, TMs-wnl, NOSE-clear, THROAT-clear & wnl. NECK:  Supple w/ fairROM; no JVD; normal carotid impulses w/o bruits; no thyromegaly or nodules palpated; no lymphadenopathy. CHEST:  Coarse BS w/ no wheezing, no rales, no signs of consolidation... HEART:  Regular Rhythm; without murmurs/ rubs/ or gallops heard... ABDOMEN:  Obese, soft & nontender; min LLQ tender on palp, normal bowel sounds, no organomegaly or masses detected. EXT: without deformities, mild  arthritic changes; no varicose veins/ +venous insuffic/ 1+ edema. NEURO:  no focal neuro deficits... DERM:  along right lateral chest wall, under breast & over the costal margin- scarring in skin from shingles rash...  RADIOLOGY DATA:  Reviewed in the EPIC EMR & discussed w/ the patient...  LABORATORY DATA:  Reviewed in the EPIC EMR & discussed w/ the patient...   Assessment & Plan:    LLQ Abd pain ?etiology> she has fair appetite, 29# wt loss over 73mo, and we are checking CT Abd => NEG x for 3cm cystic lesion in left inguinal region => refer to [facility] for their review   COPD>  Stable overall despite her chronic symptoms> continue NEBS, Mucinex, Fluids;  She declines regular use of Symbicort, I told her no more antibiotics w/o approp culture data...  ASHD/ CAD/ s/p CABG>  Followed by Marland Mcalpine [facility]; continue same meds; needs cardiac w/u for poss causes of Syncope...  AFIB>  On Coumadin per [facility], continue same Rx...  HYPERLIPIDEMIA>  FLP looks great, continue Simva20...  OBESITY>  We reviewed diet & exercise program; weight is coming down but she's not sure why?  GI Bleed>  Hosp 9/13 w/ Anemia & GI bleed; neg EGD & Colon, no source found...  Renal Insuffic>  Creat= 1.3 (prev 1.4-1.6 on her current regimen); advised to drink plenty of water & maintain hydration...  ORTHO>  LBP/ Compression fx/ FM/ Paget's/ Chr Pain Syndrome> managed by [name] at [facility] he [facility]; we discussed checking w/ DrBeekman before considering Ortho surg...  Anxiety>  On Alpraz prn...  Anemia>  She was Tx 2u in hosp 9/13; not currently on Fe therapy; Labs showed Hg= 10.5.Marland KitchenMarland Kitchen   Patient's Medications  New Prescriptions   No medications on file  Previous Medications   ALBUTEROL (PROVENTIL) (2.5 MG/3ML) 0.083% NEBULIZER SOLUTION    USE IN NEB TREATMENT UP TO FOUR TIMES A DAY AS NEEDED FOR WHEEZING...   ALPRAZOLAM (XANAX) 0.5 MG TABLET    Take 1/2 to 1 tablet by mouth four times daily as  needed for nerves   ASPIRIN EC 81 MG TABLET    Take 81 mg by mouth every evening.    CALCIUM CARBONATE-VITAMIN D (CALCIUM-VITAMIN D) 500-200 MG-UNIT PER TABLET    Take 1 tablet by mouth 2 (two) times daily with a meal.   DOCUSATE SODIUM (COLACE) 100 MG CAPSULE    Take 200 mg by mouth at bedtime.   FERROUS SULFATE 325 (65 FE) MG TABLET    Take 325 mg by mouth every evening.   FUROSEMIDE (LASIX) 40 MG TABLET    Take 40 mg by mouth 2 (two) times daily.    GABAPENTIN (NEURONTIN) 600 MG TABLET    TAKE 1 TABLET FOUR TIMES A DAY   GUAIFENESIN (MUCINEX) 600 MG 12 HR TABLET    Take 1,200 mg by mouth 2 (two) times daily.   ISOSORBIDE MONONITRATE (IMDUR) 30 MG 24 HR TABLET    Take 1 tablet (30 mg total) by mouth at bedtime.   METOPROLOL (LOPRESSOR) 50 MG TABLET    Take 25 mg by mouth 2 (two) times daily.    MULTIPLE VITAMIN (MULTIVITAMIN WITH MINERALS) TABS    Take 1 tablet by mouth daily.   NITROSTAT 0.4 MG SL TABLET    PLACE 1 TAB UNDER TONGUE EVERY 5 MINUTES TIL CHEST PAIN IS GONE.Marland KitchenUPTO 3 DOSES..NO RELIEF.GET HELP   OMEPRAZOLE (PRILOSEC) 20 MG CAPSULE    TAKE 1 CAPSULE (20 MG TOTAL) BY MOUTH 2 (TWO) TIMES DAILY.   OXYCODONE-ACETAMINOPHEN (PERCOCET/ROXICET) 5-325 MG PER TABLET  Take 2 tablets by mouth every 4 (four) hours as needed for pain.   POTASSIUM CHLORIDE SA (KLOR-CON M20) 20 MEQ TABLET    TAKE 1 TABLET TWICE A DAY   PROMETHAZINE (PHENERGAN) 25 MG TABLET    Take 25 mg by mouth every 6 (six) hours as needed for nausea.   SIMETHICONE (GAS-X PO)    Take 1 tablet by mouth daily as needed (gas).   SIMVASTATIN (ZOCOR) 20 MG TABLET    Take 20 mg by mouth at bedtime.    WARFARIN (COUMADIN) 5 MG TABLET    Take 1 tablet by mouth daily or as directed  Modified Medications   Modified Medication Previous Medication   AMITRIPTYLINE (ELAVIL) 50 MG TABLET amitriptyline (ELAVIL) 50 MG tablet      Take 1 tablet (50 mg total) by mouth at bedtime.    Take 50 mg by mouth at bedtime.  Discontinued Medications    AMOXICILLIN-CLAVULANATE (AUGMENTIN) 875-125 MG PER TABLET    Take 1 tablet by mouth 2 (two) times daily.   GABAPENTIN (NEURONTIN) 600 MG TABLET    Take 600 mg by mouth 4 (four) times daily.   METHYLPREDNISOLONE (MEDROL DOSEPAK) 4 MG TABLET    follow package directions 6 day pack   POTASSIUM CHLORIDE SA (K-DUR,KLOR-CON) 20 MEQ TABLET    Take 20 mEq by mouth 2 (two) times daily.

## 2013-05-24 NOTE — Patient Instructions (Signed)
Today we updated your med list in our EPIC system...    Continue your current medications the same...  Today we did some follow up blood work... We will sched a CT scan of your abdomen to eval the LLQ pain...    We will contact you w/ the results when available...   In the meanwhile apply a heating pad to the area of max discomfort in your Abd & continue your current meds...  Call for any questions...  Let's plan a follow up visit in 39mo, sooner if needed for problems.Marland KitchenMarland Kitchen

## 2013-05-25 ENCOUNTER — Ambulatory Visit: Payer: Medicare Other | Admitting: Pharmacist Clinician (PhC)/ Clinical Pharmacy Specialist

## 2013-05-26 ENCOUNTER — Ambulatory Visit (INDEPENDENT_AMBULATORY_CARE_PROVIDER_SITE_OTHER)
Admission: RE | Admit: 2013-05-26 | Discharge: 2013-05-26 | Disposition: A | Discharge status: Home / Self Care | Payer: Medicare Other | Source: Ambulatory Visit | Attending: Pulmonary Disease | Admitting: Pulmonary Disease

## 2013-05-26 DIAGNOSIS — R1032 Left lower quadrant pain: Secondary | ICD-10-CM

## 2013-05-26 MED ORDER — IOHEXOL 300 MG/ML  SOLN
100.0000 mL | Freq: Once | INTRAMUSCULAR | Status: AC | PRN
  Administered 2013-05-26: 100 mL via INTRAVENOUS

## 2013-05-29 ENCOUNTER — Telehealth: Payer: Self-pay | Admitting: Pulmonary Disease

## 2013-05-29 NOTE — Telephone Encounter (Signed)
Pt is aware of her results.  

## 2013-05-30 ENCOUNTER — Ambulatory Visit (INDEPENDENT_AMBULATORY_CARE_PROVIDER_SITE_OTHER): Payer: Medicare Other

## 2013-05-30 DIAGNOSIS — Z23 Encounter for immunization: Secondary | ICD-10-CM

## 2013-06-01 ENCOUNTER — Ambulatory Visit: Payer: Medicare Other | Admitting: Pharmacist Clinician (PhC)/ Clinical Pharmacy Specialist

## 2013-06-05 ENCOUNTER — Ambulatory Visit: Payer: Medicare Other | Admitting: Pulmonary Disease

## 2013-06-06 ENCOUNTER — Ambulatory Visit: Payer: Medicare Other | Admitting: Pharmacist Clinician (PhC)/ Clinical Pharmacy Specialist

## 2013-06-10 ENCOUNTER — Other Ambulatory Visit: Payer: Self-pay | Admitting: Cardiovascular Disease

## 2013-06-12 ENCOUNTER — Other Ambulatory Visit: Payer: Self-pay | Admitting: Cardiovascular Disease

## 2013-06-12 NOTE — Telephone Encounter (Signed)
Prescription for Lopressor sent to pharmacy electronically.  Refill request for Warfarin routed to Phillips Hay, PharmD for approval.

## 2013-06-12 NOTE — Telephone Encounter (Signed)
Rx was sent to pharmacy electronically. 

## 2013-06-21 ENCOUNTER — Ambulatory Visit: Payer: Medicare Other | Admitting: Pharmacist Clinician (PhC)/ Clinical Pharmacy Specialist

## 2013-06-22 ENCOUNTER — Ambulatory Visit (INDEPENDENT_AMBULATORY_CARE_PROVIDER_SITE_OTHER): Payer: Medicare Other | Admitting: Pharmacist Clinician (PhC)/ Clinical Pharmacy Specialist

## 2013-06-22 VITALS — BP 120/56 | HR 120

## 2013-06-22 DIAGNOSIS — I4891 Unspecified atrial fibrillation: Secondary | ICD-10-CM

## 2013-06-22 DIAGNOSIS — I4821 Permanent atrial fibrillation: Secondary | ICD-10-CM

## 2013-06-22 DIAGNOSIS — Z7901 Long term (current) use of anticoagulants: Secondary | ICD-10-CM

## 2013-06-22 LAB — POCT INR: INR: 1.8

## 2013-06-26 ENCOUNTER — Telehealth: Payer: Self-pay | Admitting: Pulmonary Disease

## 2013-06-26 MED ORDER — AMOXICILLIN-POT CLAVULANATE 875-125 MG PO TABS: 1.0000 | ORAL_TABLET | Freq: Two times a day (BID) | ORAL | Status: DC

## 2013-06-26 NOTE — Telephone Encounter (Signed)
Called spoke with patient, advised of SN's recs as stated below.  Pt okay with these recommendations and verbalized her understanding.  Rx sent to verified pharmacy.  Nothing further needed at this time; will sign off. 

## 2013-06-26 NOTE — Telephone Encounter (Signed)
Called spoke with patient who c/o:  Prod cough with green mucus  Wheezing, some chest tightness  Hoarseness x4 days  Patient has been using Mucinex BID.  Patient denies any f/c/s, dyspnea, head congestion, PND, hemoptysis.  Reports Augmentin worked well for her the last time she presented with these symptoms.  Pt is requesting rx be sent to [facility] [address]  [name] please advise, thank you.  Last ov 11.5.14 w/ SN, upcoming scheduled for 3.5.15. **pt stated that her medication list was incorrect >> Amitriptyline 50mg  is 2 capsules by mouth at bedtime.  This has been corrected.

## 2013-06-26 NOTE — Telephone Encounter (Signed)
Per SN---  augmentin 875 mg  #14  1 po bid Align once daily Continue the mucinex, fluids.

## 2013-07-07 ENCOUNTER — Telehealth: Payer: Self-pay | Admitting: Pulmonary Disease

## 2013-07-07 DIAGNOSIS — J209 Acute bronchitis, unspecified: Secondary | ICD-10-CM

## 2013-07-07 DIAGNOSIS — J449 Chronic obstructive pulmonary disease, unspecified: Secondary | ICD-10-CM

## 2013-07-07 MED ORDER — METHYLPREDNISOLONE (PAK) 4 MG PO TABS: ORAL_TABLET | ORAL | Status: DC

## 2013-07-07 MED ORDER — HYDROCOD POLST-CHLORPHEN POLST 10-8 MG/5ML PO LQCR: 5.0000 mL | Freq: Two times a day (BID) | ORAL | Status: DC | PRN

## 2013-07-07 NOTE — Telephone Encounter (Signed)
I called and spoke with pt. She c/o prod cough w/ green phlem, wheezing, chest tx, PND, nasal congestion. She reports she is coughing a lot and can;t sleep at night.  We called in on 06/26/13 augmentin for pt. She reports normally we call in abx and prednisone. Please advise SN thanks  No Known Allergies

## 2013-07-07 NOTE — Telephone Encounter (Signed)
Per SN---  Need sputum sample for c&s.  Can call in tussionex  #4oz   1 tsp every 12 hours as needed. Medrol dosepak #1 take as directed.

## 2013-07-07 NOTE — Telephone Encounter (Signed)
Spoke with patients husband as patient was resting at this time. Aware to come by and get sputum cup from lab and return as they instruct as well as come by and pick up Rx's as patient will need hard copy for Tussionex at pharmacy. Rx's given to Leigh to have SN sign and place up front for patient.   Lab order placed as well.

## 2013-07-14 ENCOUNTER — Other Ambulatory Visit: Payer: Medicare Other

## 2013-07-14 DIAGNOSIS — J449 Chronic obstructive pulmonary disease, unspecified: Secondary | ICD-10-CM

## 2013-07-14 DIAGNOSIS — J209 Acute bronchitis, unspecified: Secondary | ICD-10-CM

## 2013-07-15 ENCOUNTER — Other Ambulatory Visit: Payer: Self-pay | Admitting: Pulmonary Disease

## 2013-07-17 ENCOUNTER — Telehealth: Payer: Self-pay | Admitting: Pulmonary Disease

## 2013-07-17 LAB — RESPIRATORY CULTURE OR RESPIRATORY AND SPUTUM CULTURE: Gram Stain: NONE SEEN

## 2013-07-17 NOTE — Telephone Encounter (Signed)
Spoke with pt-- Requesting appt with SN tomorrow 07/18/13  Pt concerned she may have the flu and bronchitis--pt c/o cough, hoarseness and body aches all over.  Appt with SN at 11am

## 2013-07-18 ENCOUNTER — Other Ambulatory Visit (INDEPENDENT_AMBULATORY_CARE_PROVIDER_SITE_OTHER): Payer: Medicare Other

## 2013-07-18 ENCOUNTER — Ambulatory Visit (INDEPENDENT_AMBULATORY_CARE_PROVIDER_SITE_OTHER): Payer: Medicare Other | Admitting: Pulmonary Disease

## 2013-07-18 ENCOUNTER — Ambulatory Visit: Payer: Medicare Other | Admitting: Pharmacist Clinician (PhC)/ Clinical Pharmacy Specialist

## 2013-07-18 ENCOUNTER — Encounter: Payer: Self-pay | Admitting: Pulmonary Disease

## 2013-07-18 ENCOUNTER — Ambulatory Visit (INDEPENDENT_AMBULATORY_CARE_PROVIDER_SITE_OTHER)
Admission: RE | Admit: 2013-07-18 | Discharge: 2013-07-18 | Disposition: A | Discharge status: Home / Self Care | Payer: Medicare Other | Source: Ambulatory Visit | Attending: Pulmonary Disease | Admitting: Pulmonary Disease

## 2013-07-18 VITALS — BP 128/62 | HR 132 | Temp 101.7°F | Ht 66.0 in | Wt 219.6 lb

## 2013-07-18 DIAGNOSIS — E669 Obesity, unspecified: Secondary | ICD-10-CM

## 2013-07-18 DIAGNOSIS — I2510 Atherosclerotic heart disease of native coronary artery without angina pectoris: Secondary | ICD-10-CM

## 2013-07-18 DIAGNOSIS — M545 Low back pain: Secondary | ICD-10-CM

## 2013-07-18 DIAGNOSIS — E785 Hyperlipidemia, unspecified: Secondary | ICD-10-CM

## 2013-07-18 DIAGNOSIS — I4821 Permanent atrial fibrillation: Secondary | ICD-10-CM

## 2013-07-18 DIAGNOSIS — D638 Anemia in other chronic diseases classified elsewhere: Secondary | ICD-10-CM

## 2013-07-18 DIAGNOSIS — I4891 Unspecified atrial fibrillation: Secondary | ICD-10-CM

## 2013-07-18 DIAGNOSIS — K589 Irritable bowel syndrome without diarrhea: Secondary | ICD-10-CM

## 2013-07-18 DIAGNOSIS — J440 Chronic obstructive pulmonary disease with acute lower respiratory infection: Secondary | ICD-10-CM

## 2013-07-18 DIAGNOSIS — M797 Fibromyalgia: Secondary | ICD-10-CM

## 2013-07-18 DIAGNOSIS — K219 Gastro-esophageal reflux disease without esophagitis: Secondary | ICD-10-CM

## 2013-07-18 DIAGNOSIS — F411 Generalized anxiety disorder: Secondary | ICD-10-CM

## 2013-07-18 DIAGNOSIS — M1990 Unspecified osteoarthritis, unspecified site: Secondary | ICD-10-CM

## 2013-07-18 DIAGNOSIS — I872 Venous insufficiency (chronic) (peripheral): Secondary | ICD-10-CM

## 2013-07-18 DIAGNOSIS — G894 Chronic pain syndrome: Secondary | ICD-10-CM

## 2013-07-18 LAB — CBC WITH DIFFERENTIAL/PLATELET
Basophils Absolute: 0.2 10*3/uL — ABNORMAL HIGH (ref 0.0–0.1)
Basophils Relative: 4.7 % — ABNORMAL HIGH (ref 0.0–3.0)
Eosinophils Absolute: 0.1 10*3/uL (ref 0.0–0.7)
Hemoglobin: 9.5 g/dL — ABNORMAL LOW (ref 12.0–15.0)
Lymphocytes Relative: 28.9 % (ref 12.0–46.0)
MCHC: 32.9 g/dL (ref 30.0–36.0)
MCV: 100.8 fl — ABNORMAL HIGH (ref 78.0–100.0)
Monocytes Absolute: 0.6 10*3/uL (ref 0.1–1.0)
Neutro Abs: 2.5 10*3/uL (ref 1.4–7.7)
RBC: 2.85 Mil/uL — ABNORMAL LOW (ref 3.87–5.11)
RDW: 21.7 % — ABNORMAL HIGH (ref 11.5–14.6)

## 2013-07-18 LAB — BASIC METABOLIC PANEL
CO2: 27 mEq/L (ref 19–32)
Calcium: 8.7 mg/dL (ref 8.4–10.5)
Chloride: 100 mEq/L (ref 96–112)
Creatinine, Ser: 1.6 mg/dL — ABNORMAL HIGH (ref 0.4–1.2)
Glucose, Bld: 108 mg/dL — ABNORMAL HIGH (ref 70–99)
Sodium: 136 mEq/L (ref 135–145)

## 2013-07-18 MED ORDER — PREDNISONE 20 MG PO TABS: ORAL_TABLET | ORAL | Status: DC

## 2013-07-18 MED ORDER — LEVOFLOXACIN 750 MG PO TABS: 750.0000 mg | ORAL_TABLET | Freq: Every day | ORAL | Status: DC

## 2013-07-18 NOTE — Patient Instructions (Signed)
Today we updated your med list in our EPIC system...    Continue your current medications the same...  For your acute bronchitis etc>>    Rest at home & drink plenty of Fluids...    Take TYLENOL 2 tabs every 4-6h...    Continue the [facility] 1200mg  twice daily & lots of fluids...    Start the new antibiotic- LEVAQUIN 750mg  one tab daily x7d...    Start the Prednisone 20mg  tabs as follows>>       One tab twice daily x3 days,        Then one tab daily x 3days,       Then 1/2 tab daily x3d,        Then 1/2 tab every other day til gone...  Be sure to take a probiotic like ALIGN (OTC) one cap daily & you may also take Activia yogurt if needed...  Today we did a follow up CXR & blood count...    We will contact you w/ the results when available...   Call for any questions.Marland KitchenMarland Kitchen

## 2013-07-19 ENCOUNTER — Other Ambulatory Visit: Payer: Self-pay | Admitting: Pulmonary Disease

## 2013-07-19 MED ORDER — SULFAMETHOXAZOLE-TMP DS 800-160 MG PO TABS: 1.0000 | ORAL_TABLET | Freq: Two times a day (BID) | ORAL | Status: DC

## 2013-07-19 NOTE — Telephone Encounter (Signed)
Called and spoke with pt about her lab results----pt stated that she did take 1 dose of the levaquin.  Pt was advised by SN to hold on to the levaquin  For now and we will call in the septra ds  1 po bid  #20.   Pt is aware that this has been sent to her pharmacy and she will start on this now.  Nothing further is needed.

## 2013-07-25 ENCOUNTER — Other Ambulatory Visit: Payer: Self-pay | Admitting: Cardiovascular Disease

## 2013-07-25 ENCOUNTER — Telehealth: Payer: Self-pay | Admitting: Pulmonary Disease

## 2013-07-25 ENCOUNTER — Encounter: Payer: Self-pay | Admitting: Adult Health

## 2013-07-25 ENCOUNTER — Telehealth: Payer: Self-pay | Admitting: Pharmacist Clinician (PhC)/ Clinical Pharmacy Specialist

## 2013-07-25 ENCOUNTER — Encounter: Payer: Self-pay | Admitting: Pulmonary Disease

## 2013-07-25 ENCOUNTER — Ambulatory Visit (INDEPENDENT_AMBULATORY_CARE_PROVIDER_SITE_OTHER): Payer: Medicare Other | Admitting: Adult Health

## 2013-07-25 ENCOUNTER — Ambulatory Visit (INDEPENDENT_AMBULATORY_CARE_PROVIDER_SITE_OTHER)
Admission: RE | Admit: 2013-07-25 | Discharge: 2013-07-25 | Disposition: A | Discharge status: Home / Self Care | Payer: Medicare Other | Source: Ambulatory Visit | Attending: Adult Health | Admitting: Adult Health

## 2013-07-25 VITALS — BP 122/70 | HR 102 | Temp 100.7°F | Ht 66.0 in | Wt 211.2 lb

## 2013-07-25 DIAGNOSIS — J440 Chronic obstructive pulmonary disease with acute lower respiratory infection: Secondary | ICD-10-CM

## 2013-07-25 DIAGNOSIS — B009 Herpesviral infection, unspecified: Secondary | ICD-10-CM

## 2013-07-25 MED ORDER — VALACYCLOVIR HCL 1 G PO TABS: 2000.0000 mg | ORAL_TABLET | Freq: Two times a day (BID) | ORAL | Status: AC

## 2013-07-25 NOTE — Telephone Encounter (Signed)
Per TP: needs ov to assess blisters/rash  Called spoke with patient, advised of TP's recs.  Appt scheduled with TP this morning at 1145.  Pt okay with this time.  Nothing further needed at this time; will sign off.

## 2013-07-25 NOTE — Assessment & Plan Note (Signed)
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 Lenna Gilford  In 4 weeks and As needed   Advance soft diet as tolerated.

## 2013-07-25 NOTE — Progress Notes (Signed)
Subjective:    Patient ID: [name], female    DOB: 29-Aug-1940, 73 y.o.   MRN: [id number]  HPI 73 y/o WF here for a follow up visit... she has ASHD w/ prev CABG and chr AFib on Coumadin followed by DrBerry... she also has FM/ chr pain syndrome followed by [name]... we have followed her primarily for COPD/ Asthmatic Bronchitis- see below...  ~  December 22, 2011:  34mo ROV & [name] is c/o aches & pains from her FM; she saw [name] for Ortho & had shots in her knee but told ?she was too high risk for surg? We discussed this & I offered her second opinion consults w/ Ortho specialists at [facility]) or Phoebe Perch [facility]) if she wants...     She saw [name] for [facility] 2/13> chr AFib, on Coumadin & ASA81mg /d, plus Metop25Bid/ ImDur30/ Lasix40Bid;  He added in the LANOXIN 0.25mg /d to help her ventric response==> she did not bring her med bottles or an updated list of her meds, it is unclear if she is still taking the Lanoxin per [facility]...      We reviewed prob list, meds, xrays and labs> see below>> LABS 6/13:  FLP- at goals on Simva20;  Chems- ok w/ Creat=1.7;  CBC- ok;  TSH=1.46;  BNP=179; A1c=5.9  ~  May 24, 2012:  65mo ROV & [name] was [place] by [facility] 9/19 - 04/13/12 for weakness, dizzy, syncope in the setting of hypotension & acute anemia w/ GIbleed while on Coumadin> he specific numbers were BP=90/40, Hg=7.3, stools pos for blood, INR=2.19;  She received 2u blood, FFP, VitK;  She had EGD= neg (intact Nissen) & Colonoscopy= neg, no bleeding source found;  Coumadin was help then restarted;  Only med change was incr Metoprolol 25mg  tabs- take 3tabs Bid...  Since disch she saw TP 10/13> doing satis & Hg= 11.3;  She had f/u [facility] 10/13> stable chr AFib w/ controlled VR, same meds, no changes made...    COPD> on AlbutNEBS, Symbicort160, Mucinex; breating is stable & chest clear at present, RR=18, O2sat=94% on RA...    Cards> CAD, s/p CABG, chr AFib> on Imdur, Metoprolol, Lasix, KCl, Coumadin; [facility] & [facility] notes are reviewed...    Chol> on Simva20; last FLP at goals- continue same, get on diet, get wt down...    Obesity> she has gone 255 ==> 236 ==> 245 today; we reviewed diet, exercise, wt reduction strategies...    Renal Insuffic> prev labs in the 1.5 to 1.7 range, sl better during her Hosp reading 1.2 to 1.3, but now back to 1.6=>1.8 on her same outpt meds...    Rheum> DJD, Compression T4 & L2, chr pain syndrome, FM> she is followed by [name], DrTooke, [name]; on Percocet Tid Prn & Neurontin 600mg Bid;  Also takes Amitriptyline 50mg - 2hs & Flexeril 10 Tid Prn...    Anxiety> on Alprazolam 0.5mg  Tid as needed... We reviewed prob list, meds, xrays and labs> see below for updates >> she had Flu vaccine 9/13... LABS 11/13:  Chems-  Ok x mild renal insuffic w/ BUN=25, Creat=1.8;  CBC- mild anemia w/ Hg=10.7, Fe=88 (21%)...  ~  October 24, 2012:  4mo ROV & [name] was [facility] 12/13 for Left TKR by Hillery Aldo & she did well w/ Lovenox bridging & no medical complications...  She went to the ER 4/14 w/ syncopal spell at home when she got up to go to the bathroom hitting her head & right arm;  ER eval was neg  XRays were neg, and CTHead showed mild diffuse atrophy & vol loss w/ microvasc ischemic dis, no acute intracranial process;  Labs revealed Hg=10.8, Cr=1.66, Protime=27.3/ INR=2.69; Urine cloudy w/ WBCs (given Rocephin=>Keflex)...  We discussed checking MRI Brain and CDopplers;  She also needs f/u w/ Cards to r/o cardiac arrhythmia etc...     We reviewed prob list, meds, xrays and labs> see below for updates >> she reports that her insurance co requires change of Flexeril10 to Zanaflex4mg , and Phenergan25 to Zofran4mg ... LABS 3-4/14:  FLP- looks good on Simva20;  Chems- ok x Creat=1.66;  CBC- Hg~11, MCV=91, & Fe=62 (17%);  TSH=2.88... CTHead 4/14 showed mild diffuse atrophy & vol loss w/ microvasc ischemic dis, no acute intracranial process... MRI Brain & CDopplers have been ordered &  pending... Cardiac f/u appt w/ DrBerry...  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 She remains on ASA81 + Coumadin via [facility];  Meds were adjusted- see below;  She had recent f/u [facility], DrBerry> s/p CABG 1997 w/ patent grafts on cath 2011 and non-ischemic myoview 7/13; AFib rate controlled on Metop & anticoag on coumadin; BP controlled on meds w/o postural drop...     She had a bronchitic exac 6/14 & we called in Levaquin w/ resolution of her symptoms & back to baseline now...     Her CC= hurts all over w/ pain in knees esp; she is followed by.DrBeekman for Rheum on Oxycodone which he writes; she notes that she has Paget's in a LE bone & DrB wants to Rx w/ "shots" but she has declined & recalls DrRowe's prev advise; we do not have notes from Rheum to review...  We reviewed prob list, meds, xrays and labs> see below for updates >>   ~  May 24, 2013:  70mo ROV & [name] presents w/ LLQ pain & sl tenderness x several weeks now- she denies n/v, but is sl constip going 1-2 per wk and assoc w/ fair appetite and 29# weight loss to 218# today; GI hx includes GERD on Prilosec 20bid & she had EGD 9/13 by DrHung w/ intact Nissen & normal esoph/ gastric/ duod;  Her last colon was 9/13 by DrStark & also normal w/o divertics, polyps, or other lesions;  She had CT Pelvis 9/14 in ER w/ avulsion fracture of the greater trochanter of the proximal right femur;  Exam shows tender to palp in LLQ, no mass felt => we decided to check labs and proceed w/  CT Abd...     COPD is stable> she notes sl cough, occas green sput, stable DOE, etc; on NEBS w/ Albut Qid, Mucinex 2Bid, Fluids; recently had Augmentin & Pred called in for an exac- improved...    CAD, s/pCABG, AFib> followed by DrBerry on Coumadin, Imdur30, Metop50-1/2Bid, Lasix40Bid, K20Bid; BP= 118/60 & she notes irreg tachycardia w/ activity, no CP or ch in SOB...    Hyperlipid> on Simva20 w/ excellent numbers...    Overweight> peak wt in the 260's &  she was last 247# in July2014; today she weighs 218# for a 29# wt reduction; on questioning she isn't really dieting but avoiding sweets, eating smaller portions, & appetite is OK?    Rheum- DJD, chrLBP, compression fx, FM, Paget's, chronic pain syndrome> DrBeekman gives her Percocet, also takes Neurontin600Qid, Amitriptyine50Qhs We reviewed prob list, meds, xrays and labs> see below for updates >>  LABS 11/14:  Chems- ok x BS=124;  CBC- stable anemia w/ Hg=10.5;  Sed=53;  CRP=7.3.Marland KitchenMarland Kitchen CT Abd&Pelvis 11/14 is NEG x for 3cm cystic lesion in left inguinal region => refer to [facility] for their review...  ~  July 18, 2013:  6-7wk ROV & add-on for 2wk hx URI chracterized by sore throat, cough, dark gereen phlegm, body aches, fever to 101+ and chills; she had called w/ Augmentin called in, then Medrol dosepak, but she says no better; she had the Flu vaccine 11/14;  Exam showed Temp 101, VSS, nasal congestion, few scat rhonchi w/o consolidation;  CXR showed heart at upper lim of norm, s/p CABG, chronic changes in lungs w/o acute dis/ NAD;  LABS revealed:  Chems- ok x Cr=1.6;  CBC- anemic w/ Hg=9.5 (MCV=101), WBC=4.8;  Sput C&S collected 12/27 showed MRSA=> Rec treatment w/ SeptraDS- 1Bid x10d, Align, Yogurt, Prednisone20mg - 3d tapering schedule, Mucinex- 1200mg Bid, Fluids, Tylenol, etc...     BP, CAD, AFib> stable on meds, see above, continue same...     Other medical issues as noted...             Problem List:  COPD (NFA-213) - ex smoker, quit 54yrs... uses  nebulizer w/ ALBUTEROL Qid (using 1-2 daily) vs Proventil HFA & MUCINEX 1-2 Bid w/ fluids... baseline CXR shows prev CABG surg, borderline Cor, NAD... prev PFT's 12/05 showed more restriction (from effort and obesity) than obstruction... ~  CXR 10/09 in hosp showed biapical pleural thickening, NAD. ~  5/10:  bronchitic exas treated w/ Levaquin/ Medrol/ etc... 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 ~  12/10:  another exac requiring Levaquin/ Pred... we will add SYMBICORT160- 2sp Bid. ~  Improved w/ addition of Symbicort, but still c/o occas exac requiring antibiotics etc; she stopped the Symbicort on her own... ~  CXR 6/11 showed mild cardiomeg, s/p CABG, calcif Ao, clear/ NAD.Marland Kitchen. ~  CXR 1-2/13 showed borderline heart size, s/p CABG, lungs clear w/ min peribronch thickening, DJD spine, osteopenia... ~  PFT 6/13 showed FVC=2.01 (59%), FEV1=1.62 (64%), %1sec=81, mid-flows=81%pred==> more restricted than obstructed; asked to contue meds & LOSE WEIGHT! ~  CXR 9/13 showed normal heart size, s/p CABG, sl pulm vasc congestion, NAD.Marland Kitchen. ~  CXR 12/13 showed borderline heart size, post op bypass changes, chr bronchitic interstitial lung changes w/o acute dis... ~  7/14: breathing is stable on Albut NEBS, Mucinex, & Levaquin recently called in for bronchitis; she declines to restart Symbicort... ~  12/14: presented w/ acute bronchitic exac> treated w/ Levaquin=> changed to SeptraDS w/ Sput cult showing MRSA; plus Pred taper, Mucinex, etc...  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  on IMDUR 30mg /d, METOPROLOL50-1/2Bid, LASIX 40mg Bid, and K20Bid... ~  NuclearStressTests in 8/05 showing infer scar and mild inferolat ischemia...  ~  2DEcho 10/07 showed EF=45-50% and DD...  ~  repeat Myoview in 9/08 showed no ischemia... ~  hosp 10/09 w/ CP- cath showed 90%proxLAD, normCIRC, subtotal midRCA, LIMA & 2 vein grafts patent;  LVF= 60% w/o regional wall motion abn... no  change from 2004. ~  repeat cath 6/11 showed no change from 2009 and patent grafts... ~  Labs 6/13 look good & BNP= 179... ~  [facility] 9/13 w/ anemia & GI bleed > labs reviewed; DrKelly incr her Metoprolol to 75mg  Bid... ~  [facility] 12/13 by DrACollins for left TKR... ~  7/14: on ASA81, Metop50-1/2Bid, Imdur30, Lasix40Bid, K20Bid; BP= 150/70 & she denies CP, palpit, ch in SOB/ edema...  ATRIAL FIBRILLATION (ICD-427.31) - on COUMADIN per DrBerry... ~  [id number]: complic after epidural shot w/ large ecchymosis right lumbar area, PT INR was >6, managed by [facility]. ~  9/13:  Hosp by [facility] w/ anemia (Hg=7.3), pos stools, INR=2.19; Coumadin held transiently, Tx 2u +FFP & VitK; EGD & Colon neg; Coumadin restarted; DrKelly increased her Metoprolol to 75mg Bid... ~  EKG 4/14 showed AFib, rate93, NSSTTWA...   VENOUS INSUFFICIENCY (ICD-459.81) - she follows low sodium diet. elevates legs, wears support hose occas... she is on LASIX 40mg Bid per Cards... ~  9/12: [name] ordered VenDopplers- neg for DVT...  HYPERLIPIDEMIA (ICD-272.4) - on ZOCOR 20mg /d... ~  [place] 2/09 showed TChol 112, TG 76, HDL 43, LDL 54 ~  FLP 3/10 showed TChol 166, TG 137, HDL 40, LDL 98 ~  FLP 10/10 showed TChol 120, Tg 91, HDL 47, LDL 55 ~  12/11 & 6/12: not fasting for today's OV's... ~  4/12: note from [facility] showed TChol 99, TG 68, HDL 43, LDL 42 on Simva20. ~  FLP 1/13 on Simva20 showed TChol 110, TG 98, HDL 47, LDL 43 ~  FLP on 6/13 Simva20 showed TChol 104, TG 96, HDL 50, LDL 35 ~  FLP 3/14 on Simva20 showed TChol 81, TG 85, HDL 34, LDL 30  MORBID OBESITY (ICD-278.01) - we have discussed diet and exercise program on numerous occas in the past and again today! ~  weight 2/10 = 250# ~  weight 7/10 = 262# ~  weight 8/10 = 245# ~  weight 12/10 = 266# "it's a fighting battle" ~  weight 4/11 = 262# ~  weight 8/11 = 256# ~  weight 12/11 = 263#... we reviewed diet + exercise thereapy. ~  Weight 6/12 = 271# ~  Weight 1/13 = 266# ~  Weight  6/13 = 255# ~  Weight 11/13 = 245# ~  Weight 4/14 = 256# ~  Weight 7/14 = 247# ~  Weight 11/14 = 218#  GERD (ICD-530.81) - on OMEPRAZOLE 20mg Bid... ?[facility] changed to Aciphex? ~  EGD 9/13 by DrHung showed intact Nissen fundoplication, normal esoph, gastric mucosa, and duod  IBS (ICD-564.1) - colonoscopy was 9/07 by Demetra Shiner and showed hems only... 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 ~  Colonoscopy 9/13 by DrStark was normal- no divertics, AVMs, polyps, or other lesions... ~  11/14: presents w/ LLQ pain & sl tenderness x several weeks- denies n/v, +constip going 1-2 per wk and assoc w/ fair appetite and 29# weight loss; GI hx as noted; She had CT Pelvis 9/14 in ER w/ avulsion fracture of the greater trochanter of the proximal right femur;  Exam shows tender to palp in LLQ, no mass felt => we decided to check labs and proceed w/ CT Abd => NEG x for 3cm cystic lesion in left inguinal region => refer to [facility] for their review.  RENAL INSUFFICIENCY (ICD-588.9) - Creat in the 1.6-1.7 range... ~  labs 2/09 showed BUN= 16, Creat= 1.6 ~  labs 3/10 showed BUN= 20, Creat= 1.7 ~  labs 4/11 showed BUN= 15, Creat= 1.6 ~  labs in  hosp 6/11 showed BUN= 9, Creat= 1.3 ~  Labs here 6/12 showed BUN= 21, Creat= 1.6 ~  Labs 1/13 on Lasix40Bid showed BUN=15, Creat= 1.7 ~  Labs 6/13 on Lasix40Bid showed BUN= 21, Creat= 1.7 ~  Labs 9/13 in hosp showed Creat= 1.2-1.3 ~  Labs 11/13 on Lasix40Bid showed BUN= 25, Creat= 1.8 ~  Labs 3/14 on Lasix40Bid+K20Bid showed K=5.1, BUN=22, Cr=1.7 ~  Labs 11/14 on Lasix40Bid+K20Bid showed K=4.8, Cr=1.3 ~  Labs 12/14 on same meds showed Cr=1.6  OTHER SYMPTOMS INVOLVING URINARY SYSTEM (ICD-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 she responded to McEwen 8mg  for overact bladder symptoms==> no longer on her med list...  DEGENERATIVE JOINT DISEASE >> LOW BACK PAIN, CHRONIC - eval and treated by Rayford Halsted, now DrBeekman>  "they do it all now" w/ rechecks Q3-62months... she takes OXYCODONE 5/325, NEURONTIN 600mg Bid, etc... COMPRESSION FRACTURE - found to have compression T12 & L2 4/11 treated by NS- DrTooke... CHRONIC PAIN SYNDROME - managed by Renaee Munda... FIBROMYALGIA - on AMITRIPTYLINE 50mg - 2Qhs & FLEXERIL 10mg  Tid... PAGET'S DISEASE - on Observation per [facility], Rheumatology... ~  labs 2/09 showed AlkPhos= 79 ~  labs 3/10 showed AlkPhos= 86 ~  labs 4/11 showed AlkPhos= 70 ~  labs 6/11 in hosp showed AlkPhos = 71 ~  Bone Scan 6/11 showed stable Paget's distal left tibia, compress fx L2, OA of both knees. ~  Labs 6/13 showed AlkPhos= 86, remains wnl... ~  Labs 3/14 showed AlkPhos= 108 ~  We do not have notes from Rheum to review...  SYNCOPE >> Adm 4/14 by [facility]- assoc w/ postural change by history & UTI via ER... ~  4/14:  CT Head in ER showed mild diffuse atrophy & vol loss w/ microvasc ischemic dis, no acute intracranial process... ~  4/14:  No postural BP changes in office today; she has been treated for UTI found in ER last week; we will order MRI & CDopplers; she will f/u w/ Cards... 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    ANXIETY - on ALPRAZOLAM 0.5mg Qid prn...  Hx of HERPES ZOSTER (ICD-053.9), & POSTHERPETIC NEURALGIA (ICD-053.19)  ANEMIA >>  ~  Labs 11/13 showed Hg=10.7, MCV=92, Fe=88 (21%sat)... ~  Labs 3/14 showed Hg=11.4, MCV=91, Fe=62 (17%sat)... ~  Labs 11/14 showed Hg=10.5, MCV=95 ~  Labs 12/14 showed Hg= 9.5.Marland KitchenMarland Kitchen Continue supplements...   Past Surgical History  Procedure Laterality Date  . Vesicovaginal fistula closure w/ tah    . Cardiac catheterization  01/06/2010    RCA mid artery stenosis at 99%, LAD  proximal occlusion 90%  at origin of ramus  . Nissen fundoplication    . Esophagogastroduodenoscopy  04/09/2012    Procedure: ESOPHAGOGASTRODUODENOSCOPY (EGD);  Surgeon: Beryle Beams, MD;  Location: [facility] ENDOSCOPY;  Service: Endoscopy;  Laterality: N/A;  tentatively scheduled per Trula Slade due to patients breathing problems  . Colonoscopy  04/11/2012    Procedure: COLONOSCOPY;  Surgeon: Ladene Artist, MD,FACG;  Location: [facility] ENDOSCOPY;  Service: Endoscopy;  Laterality: N/A;  . Abdominal hysterectomy  at 73 years old  . Appendectomy  about 50 years ago  . Coronary artery bypass graft  1997    LIMA to LAD, SVG  to ramus, SVG to RCA  . Cholecystectomy  50 years ago  . Neck surgery  73 years old    full movement  . Right knee arthroscopy  3 years ago  . Eye surgery  2005    cataracts both eyes  . Back surgery      x5, "birdcage" and fused in lower back  . Total knee arthroplasty  06/28/2012    Procedure: TOTAL KNEE ARTHROPLASTY;  Surgeon: Eugenia Mcalpine, MD;  Location: [facility] ORS;  Service: Orthopedics;  Laterality: Left;  . Joint replacement Left 06/28/12    knee replacement    Outpatient Encounter Prescriptions as of 07/18/2013  Medication Sig  . albuterol (PROVENTIL) (2.5 MG/3ML) 0.083% nebulizer solution USE IN NEB TREATMENT UP TO FOUR TIMES A DAY AS NEEDED FOR WHEEZING...  . ALPRAZolam (XANAX) 0.5 MG tablet TAKE 1/2 TO 1 TABLET BY MOUTH 4 TIMES A DAY AS NEEDED  . amitriptyline (ELAVIL) 50 MG tablet Take 100 mg by mouth at bedtime.  Marland Kitchen aspirin EC 81 MG tablet Take 81 mg by mouth every evening.   . Calcium Carbonate-Vitamin D (CALCIUM-VITAMIN D) 500-200 MG-UNIT per tablet Take 1 tablet by mouth 2 (two) times daily with a meal.  . chlorpheniramine-HYDROcodone (TUSSIONEX PENNKINETIC ER) 10-8 MG/5ML LQCR Take 5 mLs by mouth every 12 (twelve) hours as needed for cough.  . docusate sodium (COLACE) 100 MG capsule Take 200 mg by mouth at bedtime.  . ferrous sulfate 325 (65 FE) MG tablet Take 325  mg by mouth every evening.  . furosemide (LASIX) 40 MG tablet Take 40 mg by mouth 2 (two) times daily.   Marland Kitchen gabapentin (NEURONTIN) 600 MG tablet TAKE 1 TABLET FOUR TIMES A DAY  . guaiFENesin (MUCINEX) 600 MG 12 hr tablet Take 1,200 mg by mouth 2 (two) times daily.  . isosorbide mononitrate (IMDUR) 30 MG 24 hr tablet Take 1 tablet (30 mg total) by mouth at bedtime.  . metoprolol (LOPRESSOR) 50 MG tablet Take 0.5 tablets (25 mg total) by mouth 2 (two) times daily.  . Multiple Vitamin (MULTIVITAMIN WITH MINERALS) TABS Take 1 tablet by mouth daily.  Marland Kitchen NITROSTAT 0.4 MG SL tablet PLACE 1 TAB UNDER TONGUE EVERY 5 MINUTES TIL CHEST PAIN IS GONE.Marland KitchenUPTO 3 DOSES..NO RELIEF.GET HELP  . omeprazole (PRILOSEC) 20 MG capsule TAKE 1 CAPSULE (20 MG TOTAL) BY MOUTH 2 (TWO) TIMES DAILY.  Marland Kitchen oxyCODONE-acetaminophen (PERCOCET/ROXICET) 5-325 MG per tablet Take 2 tablets by mouth every 4 (four) hours as needed for pain.  . potassium chloride SA (KLOR-CON M20) 20 MEQ tablet TAKE 1 TABLET TWICE A DAY  . promethazine (PHENERGAN) 25 MG tablet Take 25 mg by mouth every 6 (six) hours as needed for nausea.  . Simethicone (GAS-X PO) Take 1 tablet by mouth daily as needed (gas).  . simvastatin (ZOCOR) 20 MG tablet TAKE 1 TABLET AT BEDTIME  . warfarin (COUMADIN) 5 MG tablet Take 1 tablet by mouth daily or as directed  . warfarin (COUMADIN) 5 MG tablet Take 1 tablet by mouth daily or as directed  . levofloxacin (LEVAQUIN) 750 MG tablet Take 1 tablet (750 mg total) by mouth daily.  . predniSONE (DELTASONE) 20 MG tablet Take as directed  . [DISCONTINUED] amoxicillin-clavulanate (AUGMENTIN) 875-125 MG per tablet Take 1 tablet by mouth 2 (two) times daily.  . [DISCONTINUED] methylPREDNIsolone (MEDROL DOSPACK) 4 MG tablet follow package directions    No Known Allergies   Current Medications, Allergies, Past Medical History, Past Surgical History, Family History, and Social  History were reviewed in [place]  record.    Review of Systems         See HPI - all other systems neg except as noted... The patient complains of decreased hearing, dyspnea on exertion, LLQ abd pain, headaches, incontinence, and depression.  The patient denies anorexia, fever, weight loss, weight gain, vision loss, hoarseness, chest pain, syncope, peripheral edema, prolonged cough, hemoptysis, melena, hematochezia, severe indigestion/heartburn, hematuria, muscle weakness, suspicious skin lesions, transient blindness, difficulty walking, unusual weight change, abnormal bleeding, enlarged lymph nodes, and angioedema.    Objective:   Physical Exam     WD, Obese, 73 y/o WF in NAD... GENERAL:  Alert & oriented; pleasant & cooperative... HEENT:  [place]/AT, EOM- full, PERRLA, EACs-clear, TMs-wnl, NOSE-clear, THROAT-clear & wnl. NECK:  Supple w/ fairROM; no JVD; normal carotid impulses w/o bruits; no thyromegaly or nodules palpated; no lymphadenopathy. CHEST:  Coarse BS w/ no wheezing, no rales, no signs of consolidation... HEART:  Regular Rhythm; without murmurs/ rubs/ or gallops heard... ABDOMEN:  Obese, soft & nontender; min LLQ tender on palp, normal bowel sounds, no organomegaly or masses detected. EXT: without deformities, mild arthritic changes; no varicose veins/ +venous insuffic/ 1+ edema. NEURO:  no focal neuro deficits... DERM:  along right lateral chest wall, under breast & over the costal margin- scarring in skin from shingles rash...  RADIOLOGY DATA:  Reviewed in the EPIC EMR & discussed w/ the patient...  LABORATORY DATA:  Reviewed in the EPIC EMR & discussed w/ the patient...   Assessment & Plan:    Acute bronchitic exac> sput cult grew MRSA and we decided to treat w/ SeptraDS, Pred taper, Mucinex, Fluids, etc...   COPD>  Stable overall despite her chronic symptoms> continue NEBS, Mucinex, Fluids;  She declines regular use of Symbicort, I told her no more antibiotics w/o approp culture data...  ASHD/ CAD/ s/p  CABG>  Followed by Cyndra Numbers [facility]; continue same meds; needs cardiac w/u for poss causes of Syncope...  AFIB>  On Coumadin per [facility], continue same Rx...  HYPERLIPIDEMIA>  FLP looks great, continue Simva20...  OBESITY>  We reviewed diet & exercise program; weight is coming down but she's not sure why?  GI Bleed>  Hosp 9/13 w/ Anemia & GI bleed; neg EGD & Colon, no source found...  LLQ Abd pain ?etiology> she has fair appetite, 29# wt loss over 48mo, and we are checking CT Abd => NEG x for 3cm cystic lesion in left inguinal region => refer to [facility] for their review  Renal Insuffic>  Creat= 1.3 (prev 1.4-1.6 on her current regimen); advised to drink plenty of water & maintain hydration...  ORTHO>  LBP/ Compression fx/ FM/ Paget's/ Chr Pain Syndrome> managed by [name] at [facility] he took over for [facility]; we discussed checking w/ DrBeekman before considering Ortho surg...  Anxiety>  On Alpraz prn...  Anemia>  She was Tx 2u in hosp 9/13; Labs showed Hg= 9.5; continue supplements...   Patient's Medications  New Prescriptions   LEVOFLOXACIN (LEVAQUIN) 750 MG TABLET    Take 1 tablet (750 mg total) by mouth daily.   PREDNISONE (DELTASONE) 20 MG TABLET    Take as directed   SULFAMETHOXAZOLE-TRIMETHOPRIM (BACTRIM DS) 800-160 MG PER TABLET    Take 1 tablet by mouth 2 (two) times daily.  Previous Medications   ALBUTEROL (PROVENTIL) (2.5 MG/3ML) 0.083% NEBULIZER SOLUTION    USE IN NEB TREATMENT UP TO FOUR TIMES A DAY AS NEEDED FOR WHEEZING...   ALPRAZOLAM (  XANAX) 0.5 MG TABLET    TAKE 1/2 TO 1 TABLET BY MOUTH 4 TIMES A DAY AS NEEDED   AMITRIPTYLINE (ELAVIL) 50 MG TABLET    Take 100 mg by mouth at bedtime.   ASPIRIN EC 81 MG TABLET    Take 81 mg by mouth every evening.    CALCIUM CARBONATE-VITAMIN D (CALCIUM-VITAMIN D) 500-200 MG-UNIT PER TABLET    Take 1 tablet by mouth 2 (two) times daily with a meal.   CHLORPHENIRAMINE-HYDROCODONE (TUSSIONEX PENNKINETIC ER) 10-8 MG/5ML LQCR    Take 5 mLs by  mouth every 12 (twelve) hours as needed for cough.   DOCUSATE SODIUM (COLACE) 100 MG CAPSULE    Take 200 mg by mouth at bedtime.   FERROUS SULFATE 325 (65 FE) MG TABLET    Take 325 mg by mouth every evening.   FUROSEMIDE (LASIX) 40 MG TABLET    Take 40 mg by mouth 2 (two) times daily.    GABAPENTIN (NEURONTIN) 600 MG TABLET    TAKE 1 TABLET FOUR TIMES A DAY   GUAIFENESIN (MUCINEX) 600 MG 12 HR TABLET    Take 1,200 mg by mouth 2 (two) times daily.   ISOSORBIDE MONONITRATE (IMDUR) 30 MG 24 HR TABLET    Take 1 tablet (30 mg total) by mouth at bedtime.   METOPROLOL (LOPRESSOR) 50 MG TABLET    Take 0.5 tablets (25 mg total) by mouth 2 (two) times daily.   MULTIPLE VITAMIN (MULTIVITAMIN WITH MINERALS) TABS    Take 1 tablet by mouth daily.   NITROSTAT 0.4 MG SL TABLET    PLACE 1 TAB UNDER TONGUE EVERY 5 MINUTES TIL CHEST PAIN IS GONE.Marland KitchenUPTO 3 DOSES..NO RELIEF.GET HELP   OMEPRAZOLE (PRILOSEC) 20 MG CAPSULE    TAKE 1 CAPSULE (20 MG TOTAL) BY MOUTH 2 (TWO) TIMES DAILY.   OXYCODONE-ACETAMINOPHEN (PERCOCET/ROXICET) 5-325 MG PER TABLET    Take 2 tablets by mouth every 4 (four) hours as needed for pain.   POTASSIUM CHLORIDE SA (KLOR-CON M20) 20 MEQ TABLET    TAKE 1 TABLET TWICE A DAY   PROMETHAZINE (PHENERGAN) 25 MG TABLET    Take 25 mg by mouth every 6 (six) hours as needed for nausea.   SIMETHICONE (GAS-X PO)    Take 1 tablet by mouth daily as needed (gas).   SIMVASTATIN (ZOCOR) 20 MG TABLET    TAKE 1 TABLET AT BEDTIME   WARFARIN (COUMADIN) 5 MG TABLET    Take 1 tablet by mouth daily or as directed   WARFARIN (COUMADIN) 5 MG TABLET    Take 1 tablet by mouth daily or as directed  Modified Medications   No medications on file  Discontinued Medications   AMOXICILLIN-CLAVULANATE (AUGMENTIN) 875-125 MG PER TABLET    Take 1 tablet by mouth 2 (two) times daily.   METHYLPREDNISOLONE (MEDROL DOSPACK) 4 MG TABLET    follow package directions

## 2013-07-25 NOTE — Telephone Encounter (Signed)
Needs ov

## 2013-07-25 NOTE — Telephone Encounter (Signed)
Called and spoke with pt and she stated that she was started on levaquin and took for 2 days and then was changed to septra ds due to the culture results.  She started on the septra 12/31 and had to stop on 07/24/2013.  Pt c/o blisters in her mouth and around her mouth, she has an ear ache, sore throat and feels raw all over.   She is also c/o feeling raw around her rectum but has not had any diarrhea.  Pt contacted the pharmacy and they told her to stop this medication and call our office.  TP please advise. Thanks  No Known Allergies   Current Outpatient Prescriptions on File Prior to Visit  Medication Sig Dispense Refill  . albuterol (PROVENTIL) (2.5 MG/3ML) 0.083% nebulizer solution USE IN NEB TREATMENT UP TO FOUR TIMES A DAY AS NEEDED FOR WHEEZING...  150 mL  3  . ALPRAZolam (XANAX) 0.5 MG tablet TAKE 1/2 TO 1 TABLET BY MOUTH 4 TIMES A DAY AS NEEDED  120 tablet  4  . amitriptyline (ELAVIL) 50 MG tablet Take 100 mg by mouth at bedtime.      Marland Kitchen aspirin EC 81 MG tablet Take 81 mg by mouth every evening.       . Calcium Carbonate-Vitamin D (CALCIUM-VITAMIN D) 500-200 MG-UNIT per tablet Take 1 tablet by mouth 2 (two) times daily with a meal.      . chlorpheniramine-HYDROcodone (TUSSIONEX PENNKINETIC ER) 10-8 MG/5ML LQCR Take 5 mLs by mouth every 12 (twelve) hours as needed for cough.  120 mL  0  . docusate sodium (COLACE) 100 MG capsule Take 200 mg by mouth at bedtime.      . ferrous sulfate 325 (65 FE) MG tablet Take 325 mg by mouth every evening.      . furosemide (LASIX) 40 MG tablet Take 40 mg by mouth 2 (two) times daily.       Marland Kitchen gabapentin (NEURONTIN) 600 MG tablet TAKE 1 TABLET FOUR TIMES A DAY  360 tablet  2  . guaiFENesin (MUCINEX) 600 MG 12 hr tablet Take 1,200 mg by mouth 2 (two) times daily.      . isosorbide mononitrate (IMDUR) 30 MG 24 hr tablet Take 1 tablet (30 mg total) by mouth at bedtime.  30 tablet  11  . levofloxacin (LEVAQUIN) 750 MG tablet Take 1 tablet (750 mg total) by mouth  daily.  7 tablet  0  . metoprolol (LOPRESSOR) 50 MG tablet Take 0.5 tablets (25 mg total) by mouth 2 (two) times daily.  90 tablet  2  . Multiple Vitamin (MULTIVITAMIN WITH MINERALS) TABS Take 1 tablet by mouth daily.      Marland Kitchen NITROSTAT 0.4 MG SL tablet PLACE 1 TAB UNDER TONGUE EVERY 5 MINUTES TIL CHEST PAIN IS GONE.Marland KitchenUPTO 3 DOSES..NO RELIEF.GET HELP  25 tablet  10  . omeprazole (PRILOSEC) 20 MG capsule TAKE 1 CAPSULE (20 MG TOTAL) BY MOUTH 2 (TWO) TIMES DAILY.  180 capsule  3  . oxyCODONE-acetaminophen (PERCOCET/ROXICET) 5-325 MG per tablet Take 2 tablets by mouth every 4 (four) hours as needed for pain.      . potassium chloride SA (KLOR-CON M20) 20 MEQ tablet TAKE 1 TABLET TWICE A DAY  180 tablet  3  . predniSONE (DELTASONE) 20 MG tablet Take as directed  12 tablet  0  . promethazine (PHENERGAN) 25 MG tablet Take 25 mg by mouth every 6 (six) hours as needed for nausea.      . Simethicone (  GAS-X PO) Take 1 tablet by mouth daily as needed (gas).      . simvastatin (ZOCOR) 20 MG tablet TAKE 1 TABLET AT BEDTIME  90 tablet  2  . sulfamethoxazole-trimethoprim (BACTRIM DS) 800-160 MG per tablet Take 1 tablet by mouth 2 (two) times daily.  20 tablet  0  . warfarin (COUMADIN) 5 MG tablet Take 1 tablet by mouth daily or as directed  90 tablet  1  . warfarin (COUMADIN) 5 MG tablet Take 1 tablet by mouth daily or as directed  90 tablet  1   No current facility-administered medications on file prior to visit.

## 2013-07-25 NOTE — Telephone Encounter (Signed)
Pt has refill request for warfarin, noticed that she is currently on prednisone + smx/tmp for infection.  Both meds potentially increase INR.  Asked pt to hold warfarin x 2 days and if possible needs INR check by end of week.  Pt very hoarse/weak, unsure if will be able to come in.  Reports being on Levaquin prior to the sulfa.  States was told to go to hospital if not improved soon.  Asked pt to come in end of week if feeling better.  Will watch for hospital/ER visit for possible INR.

## 2013-07-25 NOTE — Progress Notes (Signed)
Subjective:    Patient ID: [name], female    DOB: 1941-01-04, 73 y.o.   MRN: [id number]  HPI 73 y/o WF here for a follow up visit... she has ASHD w/ prev CABG and chr AFib on Coumadin followed by DrBerry... she also has FM/ chr pain syndrome followed by [name]... we have followed her primarily for COPD/ Asthmatic Bronchitis- see below...  ~  December 22, 2011:  5mo ROV & [name] is c/o aches & pains from her FM; she saw [name] for Ortho & had shots in her knee but told ?she was too high risk for surg? We discussed this & I offered her second opinion consults w/ Ortho specialists at [facility]) or [facility]) if she wants...     She saw [name] for [facility] 2/13> chr AFib, on Coumadin & ASA81mg /d, plus Metop25Bid/ ImDur30/ Lasix40Bid;  He added in the LANOXIN 0.25mg /d to help her ventric response==> she did not bring her med bottles or an updated list of her meds, it is unclear if she is still taking the Lanoxin per [facility]...      We reviewed prob list, meds, xrays and labs> see below>> LABS 6/13:  FLP- at goals on Simva20;  Chems- ok w/ Creat=1.7;  CBC- ok;  TSH=1.46;  BNP=179; A1c=5.9  ~  May 24, 2012:  19mo ROV & [name] was Bow[facility] by [facility] 9/19 - 04/13/12 for weakness, dizzy, syncope in the setting of hypotension & acute anemia w/ GIbleed while on Coumadin> he specific numbers were BP=90/40, Hg=7.3, stools pos for blood, INR=2.19;  She received 2u blood, FFP, VitK;  She had EGD= neg (intact Nissen) & Colonoscopy= neg, no bleeding source found;  Coumadin was help then restarted;  Only med change was incr Metoprolol 25mg  tabs- take 3tabs Bid...  Since disch she saw TP 10/13> doing satis & Hg= 11.3;  She had f/u [facility] 10/13> stable chr AFib w/ controlled VR, same meds, no changes made...    COPD> on AlbutNEBS, Symbicort160, Mucinex; breating is stable & chest clear at present, RR=18, O2sat=94% on RA...    Cards> CAD, s/p CABG, chr AFib> on Imdur, Metoprolol, Lasix, KCl, Coumadin; [facility] & [facility] are [facility]...    Chol> on Simva20; last FLP at goals- continue same, get on diet, get wt down...    Obesity> she has gone 255 ==> 236 ==> 245 today; we reviewed diet, exercise, wt reduction strategies...    Renal Insuffic> prev labs in the 1.5 to 1.7 range, sl better during her Hosp reading 1.2 to 1.3, but now back to 1.6=>1.8 on her same outpt meds...    Rheum> DJD, Compression T4 & L2, chr pain syndrome, FM> she is followed by [name], DrTooke, [name]; on Percocet Tid Prn & Neurontin 600mg Bid;  Also takes Amitriptyline 50mg - 2hs & Flexeril 10 Tid Prn...    Anxiety> on Alprazolam 0.5mg  Tid as needed... We reviewed prob list, meds, xrays and labs> see below for updates >> she had Flu vaccine 9/13... LABS 11/13:  Chems-  Ok x mild renal insuffic w/ BUN=25, Creat=1.8;  CBC- mild anemia w/ Hg=10.7, Fe=88 (21%)...  ~  October 24, 2012:  69mo ROV & [name] was [facility] 12/13 for Left TKR by Hillery Aldo & she did well w/ Lovenox bridging & no medical complications...  She went to the ER 4/14 w/ syncopal spell at home when she got up to go to the bathroom hitting her head & right arm;  ER eval was neg  XRays were neg, and CTHead showed mild diffuse atrophy & vol loss w/ microvasc ischemic dis, no acute intracranial process;  Labs revealed Hg=10.8, Cr=1.66, Protime=27.3/ INR=2.69; Urine cloudy w/ WBCs (given Rocephin=>Keflex)...  We discussed checking MRI Brain and CDopplers;  She also needs f/u w/ Cards to r/o cardiac arrhythmia etc...     We reviewed prob list, meds, xrays and labs> see below for updates >> she reports that her insurance co requires change of Flexeril10 to Zanaflex4mg , and Phenergan25 to Zofran4mg ... LABS 3-4/14:  FLP- looks good on Simva20;  Chems- ok x Creat=1.66;  CBC- Hg~11, MCV=91, & Fe=62 (17%);  TSH=2.88... CTHead 4/14 showed mild diffuse atrophy & vol loss w/ microvasc ischemic dis, no acute intracranial process... MRI Brain & CDopplers have been ordered &  pending... Cardiac f/u appt w/ DrBerry...  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 She remains on ASA81 + Coumadin via [facility];  Meds were adjusted- see below;  She had recent f/u [facility], DrBerry> s/p CABG 1997 w/ patent grafts on cath 2011 and non-ischemic myoview 7/13; AFib rate controlled on Metop & anticoag on coumadin; BP controlled on meds w/o postural drop...     She had a bronchitic exac 6/14 & we called in Levaquin w/ resolution of her symptoms & back to baseline now...     Her CC= hurts all over w/ pain in knees esp; she is followed by.DrBeekman for Rheum on Oxycodone which he writes; she notes that she has Paget's in a LE bone & DrB wants to Rx w/ "shots" but she has declined & recalls DrRowe's prev advise; we do not have notes from Rheum to review...  We reviewed prob list, meds, xrays and labs> see below for updates >>   ~  May 24, 2013:  60mo ROV & [name] presents w/ LLQ pain & sl tenderness x several weeks now- she denies n/v, but is sl constip going 1-2 per wk and assoc w/ fair appetite and 29# weight loss to 218# today; GI hx includes GERD on Prilosec 20bid & she had EGD 9/13 by DrHung w/ intact Nissen & normal esoph/ gastric/ duod;  Her last colon was 9/13 by DrStark & also normal w/o divertics, polyps, or other lesions;  She had CT Pelvis 9/14 in ER w/ avulsion fracture of the greater trochanter of the proximal right femur;  Exam shows tender to palp in LLQ, no mass felt => we decided to check labs and proceed w/  CT Abd...     COPD is stable> she notes sl cough, occas green sput, stable DOE, etc; on NEBS w/ Albut Qid, Mucinex 2Bid, Fluids; recently had Augmentin & Pred called in for an exac- improved...    CAD, s/pCABG, AFib> followed by DrBerry on Coumadin, Imdur30, Metop50-1/2Bid, Lasix40Bid, K20Bid; BP= 118/60 & she notes irreg tachycardia w/ activity, no CP or ch in SOB...    Hyperlipid> on Simva20 w/ excellent numbers...    Overweight> peak wt in the 260's &  she was last 247# in July2014; today she weighs 218# for a 29# wt reduction; on questioning she isn't really dieting but avoiding sweets, eating smaller portions, & appetite is OK?    Rheum- DJD, chrLBP, compression fx, FM, Paget's, chronic pain syndrome> DrBeekman gives her Percocet, also takes Neurontin600Qid, Amitriptyine50Qhs We reviewed prob list, meds, xrays and labs> see below for updates >>  LABS 11/14:  Chems- ok x BS=124;  CBC- stable anemia w/ Hg=10.5;  Sed=53;  CRP=7.3.Marland KitchenMarland Kitchen CT Abd&Pelvis 11/14 is NEG x for 3cm cystic lesion in left inguinal region => refer to [facility] for their review...  07/25/2013 Acute OV Developed blisters in/around mouth, around rectal area  after taking 2days of Septra DS.   Also notes some wheezing. Has not taken the Septra DS since 07/24/13 AM dose Was seen 1 week ago for slow to resolve  bronchitis with levaquin . Sputum cx showed MRSA , she was changed over to Septra. Says she developed blisters all in her mouth and lips.  She says cough is congested and still has fever.  Had cxr 12/30 with no acute findings.  Repeat CXR today with no acute findings.  Has no lip/tongue swelling, dysphagia .  No rash.  Appetite is ok but hurts lips to eat.             Problem List:  COPD ([id number]) - ex smoker, quit 10yrs... uses nebulizer w/ ALBUTEROL Qid (using 1-2 daily) vs Proventil HFA & MUCINEX 1-2 Bid w/ fluids... baseline CXR shows prev CABG surg, borderline Cor, NAD... prev PFT's 12/05 showed more restriction (from effort  and obesity) than obstruction... ~  CXR 10/09 in hosp showed biapical pleural thickening, NAD. ~  5/10:  bronchitic exas treated w/ Levaquin/ Medrol/ etc... 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 ~  12/10:  another exac requiring Levaquin/ Pred... we will add SYMBICORT160- 2sp Bid. ~  Improved w/ addition of Symbicort, but still c/o occas exac requiring antibiotics etc; she stopped the Symbicort on her own... ~  CXR 6/11 showed mild cardiomeg, s/p CABG, calcif Ao, clear/ NAD.Marland Kitchen. ~  CXR 1-2/13 showed borderline heart size, s/p CABG, lungs clear w/ min peribronch thickening, DJD spine, osteopenia... ~  PFT 6/13 showed FVC=2.01 (59%), FEV1=1.62 (64%), %1sec=81, mid-flows=81%pred==> more restricted than obstructed; asked to contue meds & LOSE WEIGHT! ~  CXR 9/13 showed normal heart size, s/p CABG, sl pulm vasc congestion, NAD.Marland Kitchen. ~  CXR 12/13 showed borderline heart size, post op bypass changes, chr bronchitic interstitial lung changes w/o acute dis... ~  7/14: breathing is stable on Albut NEBS, Mucinex, & Levaquin recently called in for bronchitis; she declines to restart Symbicort...  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  on IMDUR 30mg /d, METOPROLOL50-1/2Bid, LASIX 40mg Bid, and K20Bid... ~  NuclearStressTests in 8/05 showing infer scar and mild inferolat ischemia...  ~  2DEcho 10/07 showed EF=45-50% and DD...  ~  repeat Myoview in 9/08 showed no ischemia... ~  hosp 10/09 w/ CP- cath showed 90%proxLAD, normCIRC, subtotal midRCA, LIMA & 2 vein grafts patent;  LVF= 60% w/o regional wall motion abn... no change from 2004. ~  repeat cath 6/11 showed no change from 2009 and patent grafts... ~  Labs 6/13 look good & BNP= 179... ~  [facility] 9/13 w/ anemia & GI bleed > labs reviewed; DrKelly incr her Metoprolol to 75mg  Bid... ~  [facility] 12/13 by DrACollins for left TKR... ~  7/14: on ASA81, Metop50-1/2Bid, Imdur30, Lasix40Bid,  K20Bid; BP= 150/70 & she denies  CP, palpit, ch in SOB/ edema...  ATRIAL FIBRILLATION (ICD-427.31) - on COUMADIN per DrBerry... ~  5/10: complic after epidural shot w/ large ecchymosis right lumbar area, PT INR was >6, managed by [facility]. ~  9/13:  Hosp by [facility] w/ anemia (Hg=7.3), pos stools, INR=2.19; Coumadin held transiently, Tx 2u +FFP & VitK; EGD & Colon neg; Coumadin restarted; DrKelly increased her Metoprolol to 75mg Bid... ~  EKG 4/14 showed AFib, rate93, NSSTTWA...   VENOUS INSUFFICIENCY (ICD-459.81) - she follows low sodium diet. elevates legs, wears support hose occas... she is on LASIX 40mg Bid per Cards... ~  9/12: [name] ordered VenDopplers- neg for DVT...  HYPERLIPIDEMIA (ICD-272.4) - on ZOCOR 20mg /d... ~  [place] 2/09 showed TChol 112, TG 76, HDL 43, LDL 54 ~  FLP 3/10 showed TChol 166, TG 137, HDL 40, LDL 98 ~  FLP 10/10 showed TChol 120, Tg 91, HDL 47, LDL 55 ~  12/11 & 6/12: not fasting for today's OV's... ~  4/12: note from [facility] showed TChol 99, TG 68, HDL 43, LDL 42 on Simva20. ~  FLP 1/13 on Simva20 showed TChol 110, TG 98, HDL 47, LDL 43 ~  FLP on 6/13 Simva20 showed TChol 104, TG 96, HDL 50, LDL 35 ~  FLP 3/14 on Simva20 showed TChol 81, TG 85, HDL 34, LDL 30  MORBID OBESITY (ICD-278.01) - we have discussed diet and exercise program on numerous occas in the past and again today! ~  weight 2/10 = 250# ~  weight 7/10 = 262# ~  weight 8/10 = 245# ~  weight 12/10 = 266# "it's a fighting battle" ~  weight 4/11 = 262# ~  weight 8/11 = 256# ~  weight 12/11 = 263#... we reviewed diet + exercise thereapy. ~  Weight 6/12 = 271# ~  Weight 1/13 = 266# ~  Weight 6/13 = 255# ~  Weight 11/13 = 245# ~  Weight 4/14 = 256# ~  Weight 7/14 = 247# ~  Weight 11/14 = 218#  GERD (ICD-530.81) - on OMEPRAZOLE 20mg Bid... ?[facility] changed to Aciphex? ~  EGD 9/13 by DrHung showed intact Nissen fundoplication, normal esoph, gastric mucosa, and duod  IBS (ICD-564.1) - colonoscopy was 9/07 by Demetra Shiner and showed hems only...  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 ~  Colonoscopy 9/13 by DrStark was normal- no divertics, AVMs, polyps, or other lesions... ~  11/14: presents w/ LLQ pain & sl tenderness x several weeks- denies n/v, +constip going 1-2 per wk and assoc w/ fair appetite and 29# weight loss; GI hx as noted; She had CT Pelvis 9/14 in ER w/ avulsion fracture of the greater trochanter of the proximal right femur;  Exam shows tender to palp in LLQ, no mass felt => we decided to check labs and proceed w/ CT Abd => NEG x for 3cm cystic lesion in left inguinal region => refer to [facility] for their review.  RENAL INSUFFICIENCY (ICD-588.9) - Creat in the 1.6-1.7 range... 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 she responded to [place] 8mg  for overact bladder symptoms==> no longer on her med list...  DEGENERATIVE JOINT DISEASE >> LOW BACK PAIN, CHRONIC - eval and treated by Rayford Halsted, now DrBeekman> "they do it all now" w/ rechecks Q3-31months... she takes OXYCODONE 5/325, NEURONTIN 600mg Bid, etc... COMPRESSION FRACTURE - found to have compression T12 & L2 4/11 treated by NS- DrTooke... CHRONIC PAIN SYNDROME - managed by Renaee Munda... FIBROMYALGIA - on AMITRIPTYLINE 50mg - 2Qhs & FLEXERIL 10mg  Tid... PAGET'S DISEASE - on Observation per [facility], Rheumatology... ~  labs 2/09  showed AlkPhos= 79 ~  labs 3/10 showed AlkPhos= 86 ~  labs 4/11 showed AlkPhos= 70 ~  labs 6/11 in hosp showed AlkPhos = 71 ~  Bone Scan 6/11 showed stable Paget's distal left tibia, compress fx L2, OA of both knees. ~  Labs 6/13 showed AlkPhos= 86, remains wnl... ~  Labs 3/14 showed AlkPhos= 108 ~  We do not have notes from Rheum to review...  SYNCOPE >> Adm 4/14 by [facility]- assoc w/ postural change by history & UTI via ER... ~  4/14:  CT Head in ER showed mild diffuse atrophy & vol loss w/ microvasc ischemic dis, no acute intracranial process... ~  4/14:  No postural BP changes in office today; she has been treated for UTI found in ER last week; we will order MRI & CDopplers; she will f/u w/ Cards... 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    ANXIETY - on ALPRAZOLAM 0.5mg Qid prn...  Hx of HERPES ZOSTER (ICD-053.9), & POSTHERPETIC NEURALGIA (ICD-053.19)  ANEMIA >>  ~  Labs 11/13 showed Hg=10.7, MCV=92, Fe=88 (21%sat)... ~  Labs 3/14 showed Hg=11.4, MCV=91, Fe=62 (17%sat)... ~  Labs 11/14 showed Hg=10.5, MCV=95   Past Surgical History  Procedure Laterality Date  . Vesicovaginal fistula closure w/ tah    . Cardiac catheterization  01/06/2010    RCA mid artery stenosis at 99%, LAD proximal occlusion 90%  at origin of ramus  . Nissen fundoplication    . Esophagogastroduodenoscopy  04/09/2012    Procedure: ESOPHAGOGASTRODUODENOSCOPY (EGD);  Surgeon: Beryle Beams, MD;  Location[facility] ENDOSCOPY;  Service: Endoscopy;  Laterality: N/A;  tentatively scheduled per Trula Slade due to patients breathing problems  . Colonoscopy  04/11/2012    Procedure: COLONOSCOPY;  Surgeon: Ladene Artist, MD,FACG;  Location: [facility] ENDOSCOPY;   Service: Endoscopy;  Laterality: N/A;  . Abdominal hysterectomy  at 73 years old  . Appendectomy  about 50 years ago  . Coronary artery bypass graft  1997    LIMA to LAD, SVG to ramus, SVG to RCA  . Cholecystectomy  50 years ago  . Neck surgery  73 years old    full movement  . Right knee arthroscopy  3 years ago  . Eye surgery  2005    cataracts both eyes  . Back surgery      x5, "birdcage" and fused in lower back  . Total knee arthroplasty  06/28/2012    Procedure:  TOTAL KNEE ARTHROPLASTY;  Surgeon: Sydnee Cabal, MD;  Location: [facility] ORS;  Service: Orthopedics;  Laterality: Left;  . Joint replacement Left 06/28/12    knee replacement    Outpatient Encounter Prescriptions as of 07/25/2013  Medication Sig  . albuterol (PROVENTIL) (2.5 MG/3ML) 0.083% nebulizer solution USE IN NEB TREATMENT UP TO FOUR TIMES A DAY AS NEEDED FOR WHEEZING...  . ALPRAZolam (XANAX) 0.5 MG tablet TAKE 1/2 TO 1 TABLET BY MOUTH 4 TIMES A DAY AS NEEDED  . amitriptyline (ELAVIL) 50 MG tablet Take 100 mg by mouth at bedtime.  Marland Kitchen aspirin EC 81 MG tablet Take 81 mg by mouth every evening.   . Calcium Carbonate-Vitamin D (CALCIUM-VITAMIN D) 500-200 MG-UNIT per tablet Take 1 tablet by mouth 2 (two) times daily with a meal.  . chlorpheniramine-HYDROcodone (TUSSIONEX PENNKINETIC ER) 10-8 MG/5ML LQCR Take 5 mLs by mouth every 12 (twelve) hours as needed for cough.  . docusate sodium (COLACE) 100 MG capsule Take 200 mg by mouth at bedtime.  . ferrous sulfate 325 (65 FE) MG tablet Take 325 mg by mouth every evening.  . furosemide (LASIX) 40 MG tablet Take 40 mg by mouth 2 (two) times daily.   Marland Kitchen gabapentin (NEURONTIN) 600 MG tablet TAKE 1 TABLET FOUR TIMES A DAY  . guaiFENesin (MUCINEX) 600 MG 12 hr tablet Take 1,200 mg by mouth 2 (two) times daily.  . isosorbide mononitrate (IMDUR) 30 MG 24 hr tablet Take 1 tablet (30 mg total) by mouth at bedtime.  . metoprolol (LOPRESSOR) 50 MG tablet Take 0.5 tablets (25 mg total) by mouth  2 (two) times daily.  . Multiple Vitamin (MULTIVITAMIN WITH MINERALS) TABS Take 1 tablet by mouth daily.  Marland Kitchen NITROSTAT 0.4 MG SL tablet PLACE 1 TAB UNDER TONGUE EVERY 5 MINUTES TIL CHEST PAIN IS GONE.Marland KitchenUPTO 3 DOSES..NO RELIEF.GET HELP  . omeprazole (PRILOSEC) 20 MG capsule TAKE 1 CAPSULE (20 MG TOTAL) BY MOUTH 2 (TWO) TIMES DAILY.  Marland Kitchen oxyCODONE-acetaminophen (PERCOCET/ROXICET) 5-325 MG per tablet Take 2 tablets by mouth every 4 (four) hours as needed for pain.  . potassium chloride SA (KLOR-CON M20) 20 MEQ tablet TAKE 1 TABLET TWICE A DAY  . predniSONE (DELTASONE) 20 MG tablet Take as directed  . promethazine (PHENERGAN) 25 MG tablet Take 25 mg by mouth every 6 (six) hours as needed for nausea.  . Simethicone (GAS-X PO) Take 1 tablet by mouth daily as needed (gas).  . simvastatin (ZOCOR) 20 MG tablet TAKE 1 TABLET AT BEDTIME  . warfarin (COUMADIN) 5 MG tablet Take 1 tablet by mouth daily or as directed  . warfarin (COUMADIN) 5 MG tablet Take 1 tablet by mouth daily or as directed  . levofloxacin (LEVAQUIN) 750 MG tablet Take 1 tablet (750 mg total) by mouth daily.  Marland Kitchen sulfamethoxazole-trimethoprim (BACTRIM DS) 800-160 MG per tablet Take 1 tablet by mouth 2 (two) times daily.  . valACYclovir (VALTREX) 1000 MG tablet Take 2 tablets (2,000 mg total) by mouth 2 (two) times daily.    No Known Allergies   Current Medications, Allergies, Past Medical History, Past Surgical History, Family History, and Social History were reviewed in [employer] record.    Review of Systems Constitutional:   No  weight loss, night sweats,  Fevers, chills,  +fatigue, or  lassitude.  HEENT:   No headaches,  Difficulty swallowing,  Tooth/dental problems, or  Sore throat,                No sneezing, itching,  ear ache, nasal congestion, post nasal drip,   CV:  No chest pain,  Orthopnea, PND, swelling in lower extremities, anasarca, dizziness, palpitations, syncope.   GI  No heartburn,  indigestion, abdominal pain, nausea, vomiting, diarrhea, change in bowel habits, loss of appetite, bloody stools.   Resp:    No chest wall deformity  Skin: + rash on lips   GU: no dysuria, change in color of urine, no urgency or frequency.  No flank pain, no hematuria   MS:  No joint pain or swelling.  No decreased range of motion.  No back pain.  Psych:  No change in mood or affect. No depression or anxiety.  No memory loss.        Objective:   Physical Exam     WD, Obese, 73 y/o WF in NAD... GENERAL:  Alert & oriented; pleasant & cooperative... HEENT:  [place]/AT, EOM- full, PERRLA, EACs-clear, TMs-wnl, NOSE-clear, THROAT-clear & wnl., scattered blisters along lips and oral mucosa -c/w herpes simplex  NECK:  Supple w/ fairROM; no JVD; normal carotid impulses w/o bruits; no thyromegaly or nodules palpated; no lymphadenopathy. No stridor  CHEST:  Coarse BS w/ no wheezing, no rales, no signs of consolidation... HEART:  Regular Rhythm; without murmurs/ rubs/ or gallops heard... ABDOMEN:  Obese, soft & nontender; normal bowel sounds, no organomegaly or masses detected. EXT: without deformities, mild arthritic changes; no varicose veins/ +venous insuffic/tr + edema. NEURO:  no focal neuro deficits... DERM: no rash .Marland Kitchen

## 2013-07-25 NOTE — Assessment & Plan Note (Signed)
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  Begin Valtrex 2 gram Twice daily  X 1 day Check xray today  Mucinex DM Twice daily  As needed  Cough/congestion  Albuterol neb As needed  For wheezing /shortness of breath.  Please contact office for sooner follow up if symptoms do not improve or worsen or seek emergency care  Follow up Dr. Lenna Gilford  In 4 weeks and As needed   Advance soft diet as tolerated.

## 2013-07-25 NOTE — Patient Instructions (Addendum)
Finish Septra as directed.  Begin Valtrex 2 gram Twice daily  X 1 day Check xray today  Mucinex DM Twice daily  As needed  Cough/congestion  Albuterol neb As needed  For wheezing /shortness of breath.  Please contact office for sooner follow up if symptoms do not improve or worsen or seek emergency care  Follow up Dr. Lenna Gilford  In 4 weeks and As needed   Advance soft diet as tolerated.

## 2013-07-26 ENCOUNTER — Other Ambulatory Visit: Payer: Self-pay | Admitting: *Deleted

## 2013-08-01 ENCOUNTER — Ambulatory Visit (INDEPENDENT_AMBULATORY_CARE_PROVIDER_SITE_OTHER): Payer: Medicare Other | Admitting: Pharmacist Clinician (PhC)/ Clinical Pharmacy Specialist

## 2013-08-01 VITALS — BP 122/60 | HR 88

## 2013-08-01 DIAGNOSIS — I4891 Unspecified atrial fibrillation: Secondary | ICD-10-CM

## 2013-08-01 DIAGNOSIS — I4821 Permanent atrial fibrillation: Secondary | ICD-10-CM

## 2013-08-01 DIAGNOSIS — Z7901 Long term (current) use of anticoagulants: Secondary | ICD-10-CM

## 2013-08-01 LAB — POCT INR: INR: 2.7

## 2013-08-03 ENCOUNTER — Telehealth: Payer: Self-pay | Admitting: Pulmonary Disease

## 2013-08-03 NOTE — Telephone Encounter (Signed)
Called and spoke with pt and she stated that she has sore throat, headache, cough with green sputum.  She stated that she has 5 days of levaquin left from an rx that SN stopped her from taking and wanted to see if she could take these.  Denies any fever or any other symptoms.  Pt is requesting recs from SN.  Please advise. Thanks  No Known Allergies    Current Outpatient Prescriptions on File Prior to Visit  Medication Sig Dispense Refill  . albuterol (PROVENTIL) (2.5 MG/3ML) 0.083% nebulizer solution USE IN NEB TREATMENT UP TO FOUR TIMES A DAY AS NEEDED FOR WHEEZING...  150 mL  3  . ALPRAZolam (XANAX) 0.5 MG tablet TAKE 1/2 TO 1 TABLET BY MOUTH 4 TIMES A DAY AS NEEDED  120 tablet  4  . amitriptyline (ELAVIL) 50 MG tablet Take 100 mg by mouth at bedtime.      Marland Kitchen aspirin EC 81 MG tablet Take 81 mg by mouth every evening.       . Calcium Carbonate-Vitamin D (CALCIUM-VITAMIN D) 500-200 MG-UNIT per tablet Take 1 tablet by mouth 2 (two) times daily with a meal.      . chlorpheniramine-HYDROcodone (TUSSIONEX PENNKINETIC ER) 10-8 MG/5ML LQCR Take 5 mLs by mouth every 12 (twelve) hours as needed for cough.  120 mL  0  . docusate sodium (COLACE) 100 MG capsule Take 200 mg by mouth at bedtime.      . ferrous sulfate 325 (65 FE) MG tablet Take 325 mg by mouth every evening.      . furosemide (LASIX) 40 MG tablet Take 40 mg by mouth 2 (two) times daily.       Marland Kitchen gabapentin (NEURONTIN) 600 MG tablet TAKE 1 TABLET FOUR TIMES A DAY  360 tablet  2  . guaiFENesin (MUCINEX) 600 MG 12 hr tablet Take 1,200 mg by mouth 2 (two) times daily.      . isosorbide mononitrate (IMDUR) 30 MG 24 hr tablet Take 1 tablet (30 mg total) by mouth at bedtime.  30 tablet  11  . levofloxacin (LEVAQUIN) 750 MG tablet Take 1 tablet (750 mg total) by mouth daily.  7 tablet  0  . metoprolol (LOPRESSOR) 50 MG tablet Take 0.5 tablets (25 mg total) by mouth 2 (two) times daily.  90 tablet  2  . Multiple Vitamin (MULTIVITAMIN WITH MINERALS)  TABS Take 1 tablet by mouth daily.      Marland Kitchen NITROSTAT 0.4 MG SL tablet PLACE 1 TAB UNDER TONGUE EVERY 5 MINUTES TIL CHEST PAIN IS GONE.Marland KitchenUPTO 3 DOSES..NO RELIEF.GET HELP  25 tablet  10  . omeprazole (PRILOSEC) 20 MG capsule TAKE 1 CAPSULE (20 MG TOTAL) BY MOUTH 2 (TWO) TIMES DAILY.  180 capsule  3  . oxyCODONE-acetaminophen (PERCOCET/ROXICET) 5-325 MG per tablet Take 2 tablets by mouth every 4 (four) hours as needed for pain.      . potassium chloride SA (KLOR-CON M20) 20 MEQ tablet TAKE 1 TABLET TWICE A DAY  180 tablet  3  . predniSONE (DELTASONE) 20 MG tablet Take as directed  12 tablet  0  . promethazine (PHENERGAN) 25 MG tablet Take 25 mg by mouth every 6 (six) hours as needed for nausea.      . Simethicone (GAS-X PO) Take 1 tablet by mouth daily as needed (gas).      . simvastatin (ZOCOR) 20 MG tablet TAKE 1 TABLET AT BEDTIME  90 tablet  2  . sulfamethoxazole-trimethoprim (BACTRIM DS) 800-160 MG  per tablet Take 1 tablet by mouth 2 (two) times daily.  20 tablet  0  . warfarin (COUMADIN) 5 MG tablet Take 1 tablet by mouth daily or as directed  90 tablet  1  . warfarin (COUMADIN) 5 MG tablet Take 1 tablet by mouth daily or as directed  90 tablet  1  . warfarin (COUMADIN) 5 MG tablet Take 1 tablet by mouth daily or as directed.  90 tablet  0   No current facility-administered medications on file prior to visit.

## 2013-08-03 NOTE — Telephone Encounter (Signed)
Per SN---  Ok to start back with the levaquin  And call next week once done to see how she is doing.  Called and spoke with pt and she is aware. Nothing further is needed.

## 2013-08-14 ENCOUNTER — Telehealth: Payer: Self-pay | Admitting: Pulmonary Disease

## 2013-08-14 NOTE — Telephone Encounter (Signed)
Spoke with the pt and she has taken 2 rounds of levaquin and states that she does not feel any better. She states she is having chest congestion. Productive cough with yellow phlegm. appt set for tomorrow at 2:45. [place] Bing, CMA

## 2013-08-15 ENCOUNTER — Ambulatory Visit: Payer: Medicare Other | Admitting: Adult Health

## 2013-08-22 ENCOUNTER — Telehealth: Payer: Self-pay | Admitting: Pulmonary Disease

## 2013-08-22 NOTE — Telephone Encounter (Signed)
Last OV 07-25-13. I spoke with the pt and she states she has finished 2 rounds of levaquin and prednisone and is still not better. Pt states she is having a productive cough with green phlegm, body aches, pain in her chest and back, overall does not feel well. Pt denies any fever. Pt is asking for an appt with Dr. Lenna Gilford this week. She refused appt with TP. Please advise. [place] Bing, CMA No Known Allergies

## 2013-08-22 NOTE — Telephone Encounter (Signed)
pt advised, appt set. [place] Bing, CMA

## 2013-08-22 NOTE — Telephone Encounter (Signed)
Per SN---  Ok to schedule pt for appt on Friday 08-25-13 at 11:30

## 2013-08-25 ENCOUNTER — Encounter: Payer: Self-pay | Admitting: Pulmonary Disease

## 2013-08-25 ENCOUNTER — Ambulatory Visit: Payer: Medicare Other

## 2013-08-25 ENCOUNTER — Ambulatory Visit (INDEPENDENT_AMBULATORY_CARE_PROVIDER_SITE_OTHER): Payer: Medicare Other | Admitting: Pulmonary Disease

## 2013-08-25 VITALS — BP 130/70 | HR 105 | Temp 97.7°F | Ht 66.0 in | Wt 215.4 lb

## 2013-08-25 DIAGNOSIS — N259 Disorder resulting from impaired renal tubular function, unspecified: Secondary | ICD-10-CM

## 2013-08-25 DIAGNOSIS — I4891 Unspecified atrial fibrillation: Secondary | ICD-10-CM

## 2013-08-25 DIAGNOSIS — J449 Chronic obstructive pulmonary disease, unspecified: Secondary | ICD-10-CM

## 2013-08-25 DIAGNOSIS — R0602 Shortness of breath: Secondary | ICD-10-CM

## 2013-08-25 DIAGNOSIS — M1990 Unspecified osteoarthritis, unspecified site: Secondary | ICD-10-CM

## 2013-08-25 DIAGNOSIS — IMO0001 Reserved for inherently not codable concepts without codable children: Secondary | ICD-10-CM

## 2013-08-25 DIAGNOSIS — G894 Chronic pain syndrome: Secondary | ICD-10-CM

## 2013-08-25 DIAGNOSIS — I2510 Atherosclerotic heart disease of native coronary artery without angina pectoris: Secondary | ICD-10-CM

## 2013-08-25 DIAGNOSIS — K219 Gastro-esophageal reflux disease without esophagitis: Secondary | ICD-10-CM

## 2013-08-25 DIAGNOSIS — K589 Irritable bowel syndrome without diarrhea: Secondary | ICD-10-CM

## 2013-08-25 DIAGNOSIS — M797 Fibromyalgia: Secondary | ICD-10-CM

## 2013-08-25 DIAGNOSIS — M545 Low back pain, unspecified: Secondary | ICD-10-CM

## 2013-08-25 DIAGNOSIS — I4821 Permanent atrial fibrillation: Secondary | ICD-10-CM

## 2013-08-25 DIAGNOSIS — E785 Hyperlipidemia, unspecified: Secondary | ICD-10-CM

## 2013-08-25 DIAGNOSIS — J440 Chronic obstructive pulmonary disease with acute lower respiratory infection: Secondary | ICD-10-CM

## 2013-08-25 DIAGNOSIS — F411 Generalized anxiety disorder: Secondary | ICD-10-CM

## 2013-08-25 DIAGNOSIS — R7309 Other abnormal glucose: Secondary | ICD-10-CM

## 2013-08-25 DIAGNOSIS — E669 Obesity, unspecified: Secondary | ICD-10-CM

## 2013-08-25 DIAGNOSIS — I872 Venous insufficiency (chronic) (peripheral): Secondary | ICD-10-CM

## 2013-08-25 MED ORDER — MOMETASONE FURO-FORMOTEROL FUM 200-5 MCG/ACT IN AERO: 2.0000 | INHALATION_SPRAY | Freq: Two times a day (BID) | RESPIRATORY_TRACT | Status: DC

## 2013-08-25 NOTE — Patient Instructions (Signed)
Today we updated your med list in our EPIC system...    Continue your current medications the same...  Be sure to use the following meds regularly>>    NEBULIZER w/ Albuterol at least twice daily...    MUCINEX 1200mg  twice daily...    Lots of fluids by mouth...    DELSYM (OTC cough syrup) 2 tsp twice daily...    TUSSIONEX 1 tsp twice daily as needed for the cough  Add the new puffer> DULERA 2 sprays twice daily & alternate this w/ the NEBULIZER as we discussed...  Please collect a fresh sputum specimen for culture & we will call w/ the culture report when avail...  For completeness- we will check a CT scan of your chest & we will let you know what this shows...  Let's plan a follow up visit in [place].Marland KitchenMarland Kitchen

## 2013-08-26 ENCOUNTER — Encounter: Payer: Self-pay | Admitting: Pulmonary Disease

## 2013-08-26 NOTE — Progress Notes (Addendum)
Subjective:    Patient ID: [name], female    DOB: Feb 12, 1941, 73 y.o.   MRN: [id number]  HPI 73 y/o WF here for a follow up visit... she has ASHD w/ prev CABG and chr AFib on Coumadin followed by DrBerry... she also has FM/ chr pain syndrome followed by [name]... we have followed her primarily for COPD/ Asthmatic Bronchitis- see below...  ~  May 24, 2012:  74mo ROV & [name] was [facility] by [facility] 9/19 - 04/13/12 for weakness, dizzy, syncope in the setting of hypotension & acute anemia w/ GIbleed while on Coumadin> he specific numbers were BP=90/40, Hg=7.3, stools pos for blood, INR=2.19;  She received 2u blood, FFP, VitK;  She had EGD= neg (intact Nissen) & Colonoscopy= neg, no bleeding source found;  Coumadin was help then restarted;  Only med change was incr Metoprolol 25mg  tabs- take 3tabs Bid...  Since disch she saw TP 10/13> doing satis & Hg= 11.3;  She had f/u [facility] 10/13> stable chr AFib w/ controlled VR, same meds, no changes made...    COPD> on AlbutNEBS, Symbicort160, Mucinex; breating is stable & chest clear at present, RR=18, O2sat=94% on RA...    Cards> CAD, s/p CABG, chr AFib> on Imdur, Metoprolol, Lasix, KCl, Coumadin; [facility] & their notes are reviewed...    Chol> on Simva20; last FLP at goals- continue same, get on diet, get wt down...    Obesity> she has gone 255 ==> 236 ==> 245 today; we reviewed diet, exercise, wt reduction strategies...    Renal Insuffic> prev labs in the 1.5 to 1.7 range, sl better during her Hosp reading 1.2 to 1.3, but now back to 1.6=>1.8 on her same outpt meds...    Rheum> DJD, Compression T4 & L2, chr pain syndrome, FM> she is followed by [name], DrTooke, [name]; on Percocet Tid Prn & Neurontin 600mg Bid;  Also takes Amitriptyline 50mg - 2hs & Flexeril 10 Tid Prn...    Anxiety> on Alprazolam 0.5mg  Tid as needed... We reviewed prob list, meds, xrays and labs> see below for updates >> she had Flu vaccine 9/13... LABS 11/13:   Chems-  Ok x mild renal insuffic w/ BUN=25, Creat=1.8;  CBC- mild anemia w/ Hg=10.7, Fe=88 (21%)...  ~  October 24, 2012:  56mo ROV & [name] was [facility] 12/13 for Left TKR by Hillery Aldo & she did well w/ Lovenox bridging & no medical complications...  She went to the ER 4/14 w/ syncopal spell at home when she got up to go to the bathroom hitting her head & right arm;  ER eval was neg XRays were neg, and CTHead showed mild diffuse atrophy & vol loss w/ microvasc ischemic dis, no acute intracranial process;  Labs revealed Hg=10.8, Cr=1.66, Protime=27.3/ INR=2.69; Urine cloudy w/ WBCs (given Rocephin=>Keflex)...  We discussed checking MRI Brain and CDopplers;  She also needs f/u w/ Cards to r/o cardiac arrhythmia etc...     We reviewed prob list, meds, xrays and labs> see below for updates >> she reports that her insurance co requires change of Flexeril10 to Zanaflex4mg , and Phenergan25 to Zofran4mg ... LABS 3-4/14:  FLP- looks good on Simva20;  Chems- ok x Creat=1.66;  CBC- Hg~11, MCV=91, & Fe=62 (17%);  TSH=2.88... CTHead 4/14 showed mild diffuse atrophy & vol loss w/ microvasc ischemic dis, no acute intracranial process... MRI Brain & CDopplers have been ordered & pending... Cardiac f/u appt w/ DrBerry...  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 She remains on ASA81 + Coumadin via [facility];  Meds were adjusted- see below;  She had recent f/u [facility], DrBerry> s/p CABG 1997 w/ patent grafts on cath 2011 and non-ischemic myoview 7/13; AFib  rate controlled on Metop & anticoag on coumadin; BP controlled on meds w/o postural drop...     She had a bronchitic exac 6/14 & we called in Levaquin w/ resolution of her symptoms & back to baseline now...     Her CC= hurts all over w/ pain in knees esp; she is followed by.DrBeekman for Rheum on Oxycodone which he writes; she notes that she has Paget's in a LE bone & DrB wants to Rx w/ "shots" but she has declined & recalls DrRowe's prev advise; we do not have notes from Rheum to review...  We reviewed prob list, meds, xrays and labs> see below for updates >>   ~  May 24, 2013:  58mo ROV & [name] presents w/ LLQ pain & sl tenderness x several weeks now- she denies n/v, but is sl constip going 1-2 per wk and assoc w/ fair appetite and 29# weight loss to 218# today; GI hx includes GERD on Prilosec 20bid & she had EGD 9/13 by DrHung w/ intact Nissen & normal esoph/ gastric/ duod;  Her last colon was 9/13 by DrStark & also normal w/o divertics, polyps, or other lesions;  She had CT Pelvis 9/14 in ER w/ avulsion fracture of the greater trochanter of the proximal right femur;  Exam shows tender to palp in LLQ, no mass felt => we decided to check labs and proceed w/ CT Abd...     COPD is stable> she notes sl cough, occas green sput, stable DOE, etc; on NEBS w/ Albut Qid, Mucinex 2Bid, Fluids; recently had Augmentin & Pred called in for an exac- improved...    CAD, s/pCABG, AFib> followed by DrBerry on Coumadin, Imdur30, Metop50-1/2Bid, Lasix40Bid, K20Bid; BP= 118/60 & she notes irreg tachycardia w/ activity, no CP or ch in SOB...    Hyperlipid> on Simva20 w/ excellent numbers...    Overweight> peak wt in the 260's & she was last 247# in ID:[id number]; today she weighs 218# for a 29# wt reduction; on questioning she isn't really dieting but avoiding sweets, eating smaller portions, & appetite is OK?    Rheum- DJD, chrLBP, compression fx, FM, Paget's, chronic pain syndrome> DrBeekman gives her Percocet, also takes  Neurontin600Qid, Amitriptyine50Qhs We reviewed prob list, meds, xrays and labs> see below for updates >>  LABS 11/14:  Chems- ok x BS=124;  CBC- stable anemia w/ Hg=10.5;  Sed=53;  CRP=7.3.Marland KitchenMarland Kitchen CT Abd&Pelvis 11/14 is NEG x for 3cm cystic lesion in left inguinal region => refer to [facility] for their review...  ~  July 18, 2013:  6-7wk ROV & add-on for 2wk hx URI characterized by sore throat, cough, dark green phlegm, body aches, fever to 101+ and chills; she had called w/ Augmentin called in, then Medrol dosepak, but she says no better; she had the Flu vaccine 11/14;  Exam showed Temp 101, VSS, nasal congestion, few scat rhonchi w/o consolidation;  CXR showed heart at upper lim of norm, s/p CABG, chronic changes in lungs w/o acute dis/ NAD;  LABS revealed:  Chems- ok x Cr=1.6;  CBC- anemic w/ Hg=9.5 (MCV=101), WBC=4.8;  Sput C&S collected 12/27 showed MRSA=> Rec treatment w/ SeptraDS- 1Bid x10d, Align, Yogurt, Prednisone20mg - 3d tapering schedule, Mucinex- 1200mg Bid, Fluids, Tylenol, etc...     BP, CAD, AFib> stable on meds, see above, continue same...     Other medical issues as noted...   ~  August 25, 2013:  6wk ROV & recheck> seen 12/14 w/ URI, s/p Augmentin & Medrol dosepak but no better, CXR w/ chr changes but NAD, Labs w/ Anemia=9.5 & Cr=1.6, prev sput grew MRSA & given SeptraDS & Pred taper along w/ Mucinex, Fluids, etc (also took Levaquin after the Septra)...  She reports that she is perhaps sl better but has persist cough, green-brown phlegm, some dyspnea, using NEBS w/ Albut Qid;  Her prev Symbicort was stopped in the past by [name] & she has not done as well since then she thinks...  We discussed checking PFT, CT Chest for completeness, recheck sput C&S, adding DULERA200-5 2spBid while continuing her NEBS Bid, Mucinex1200Bid, Fluids, Delsym, Tussionex... She is not on an ACE, coumadin monitored by Cards, on antireflux regimen & Prilosec 20mg bid...     We reviewed prob list, meds, xrays and labs>  see below for updates >> she had the 2014 Flu shot in Nov... CXR 1/15 showed s/pCABG, incr interstitial markings, NAD... PFTs 2/15 showed FVC=1.75 (56%), FEV1=1.40 (60%), %1sec=80, mid-flows=71% predicted... This is mostly restricted w/ sm airways dis... CT Chest =>  Done 08/31/13>  No acute findings; mild bronchial thickening & centrilob/paraseptal emphysema; 59mm RUL nodule & scat adenopathy; cardiomeg, s/p CABG, & atherosclerosis in Ao & coronaries; partial compression of T4... Sputum for C&S =>  MRSA sens to Clinda, Genta, Linezolid, Rifamp, TCN, Septra, Vanco => treated w/ SeptraDS x 10d...             Problem List:  COPD ([id number]) - ex smoker, quit 55yrs... uses nebulizer w/ ALBUTEROL Qid vs Proventil HFA & MUCINEX 1-2 Bid w/ fluids... baseline CXR shows prev CABG surg, borderline Cor, NAD... prev PFT's 12/05 showed more restriction (from effort and obesity) than obstruction... ~  CXR 10/09 in hosp showed biapical pleural thickening, NAD. ~  5/10:  bronchitic exas treated w/ Levaquin/ Medrol/ etc... 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 ~  12/10:  another exac requiring Levaquin/ Pred... we will add SYMBICORT160- 2sp Bid. ~  Improved w/ addition of Symbicort, but still c/o occas exac requiring antibiotics etc; she stopped the Symbicort on her own... ~  CXR 6/11 showed mild cardiomeg, s/p CABG, calcif Ao, clear/ NAD.Marland Kitchen. ~  CXR 1-2/13 showed borderline heart size, s/p CABG, lungs clear w/ min peribronch thickening, DJD spine, osteopenia... ~  PFT 6/13 showed FVC=2.01 (59%), FEV1=1.62 (64%), %1sec=81, mid-flows=81%pred==> more restricted than obstructed; asked to contue meds & LOSE WEIGHT! ~  CXR 9/13 showed normal heart size, s/p CABG, sl pulm vasc congestion, NAD.Marland Kitchen. ~  CXR 12/13 showed borderline heart size, post op bypass changes, chr bronchitic interstitial lung changes w/o acute dis... ~  7/14: breathing is stable on Albut NEBS, Mucinex, & Levaquin recently called in for bronchitis; she  declines to restart Symbicort... ~  12/14: presented w/ acute bronchitic exac> treated w/ Levaquin=> changed to SeptraDS w/ Sput cult showing MRSA; plus Pred taper, Mucinex,  etc... ~  CXR 1/15 showed s/p CABG, incr interstitial markings, NAD... ~  PFTs 2/15 showed FVC=1.75 (56%), FEV1=1.40 (60%), %1sec=80, mid-flows=71% predicted... ~  CT Chest 2/15 showed no acute findings; mild bronchial thickening & centrilob/paraseptal emphysema; 59mm RUL nodule & scat adenopathy; cardiomeg, s/p CABG, & atherosclerosis in Ao & coronaries; partial compression of T4. ~  Sput C&S 2/15 grew MRSA sens to Clinda, Genta, Linezolid, Rifamp, TCN, Septra, Vanco => treated w/ SeptraDS x 10d...  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  on IMDUR 30mg /d, METOPROLOL50-1/2Bid, LASIX 40mg Bid, and K20Bid... ~  NuclearStressTests in 8/05 showing infer scar and mild inferolat ischemia...  ~  2DEcho 10/07 showed EF=45-50% and DD...  ~  repeat Myoview in 9/08 showed no ischemia... ~  hosp 10/09 w/ CP- cath showed 90%proxLAD, normCIRC, subtotal midRCA, LIMA & 2 vein grafts patent;  LVF= 60% w/o regional wall motion abn... no change from 2004. ~  repeat cath 6/11 showed no change from 2009 and patent grafts... ~  Labs 6/13 look good & BNP= 179... ~  [facility] 9/13 w/ anemia & GI bleed > labs reviewed; DrKelly incr her Metoprolol to 75mg  Bid... ~  [facility] 12/13 by DrACollins for left TKR... ~  7/14: on ASA81, Metop50-1/2Bid, Imdur30, Lasix40Bid, K20Bid; BP= 150/70 & she denies CP, palpit, ch in SOB/ edema... ~  2/15:  on ASA81, Metop50-1/2Bid, Imdur30, Lasix40Bid, K20Bid; BP= 130/70 & still denies symptoms...  ATRIAL FIBRILLATION (ICD-427.31) - on COUMADIN per DrBerry... ~  [id number]: complic after epidural shot w/ large ecchymosis right lumbar area, PT INR was >6, managed by [facility]. ~  9/13:  Hosp by [facility] w/ anemia (Hg=7.3), pos stools, INR=2.19; Coumadin held transiently, Tx 2u +FFP & VitK;  EGD & Colon neg; Coumadin restarted; DrKelly increased her Metoprolol to 75mg Bid... ~  EKG 4/14 showed AFib, rate93, NSSTTWA...   VENOUS INSUFFICIENCY (ICD-459.81) - she follows low sodium diet. elevates legs, wears support hose occas... she is on LASIX 40mg Bid per Cards... ~  9/12: [name] ordered VenDopplers- neg for DVT...  HYPERLIPIDEMIA (ICD-272.4) - on ZOCOR 20mg /d... ~  [place] 2/09 showed TChol 112, TG 76, HDL 43, LDL 54 ~  FLP 3/10 showed TChol 166, TG 137, HDL 40, LDL 98 ~  FLP 10/10 showed TChol 120, Tg 91, HDL 47, LDL 55 ~  12/11 & 6/12: not fasting for today's OV's... ~  4/12: [facility] from [facility] showed TChol 99, TG 68, HDL 43, LDL 42 on Simva20. ~  FLP 1/13 on Simva20 showed TChol 110, TG 98, HDL 47, LDL 43 ~  FLP on 6/13 Simva20 showed TChol 104, TG 96, HDL 50, LDL 35 ~  FLP 3/14 on Simva20 showed TChol 81, TG 85, HDL 34, LDL 30  MORBID OBESITY (ICD-278.01) - we have discussed diet and exercise program on numerous occas in the past and again today! ~  weight 2/10 = 250# ~  weight 7/10 = 262# ~  weight 8/10 = 245# ~  weight 12/10 = 266# "it's a fighting battle" ~  weight 4/11 = 262# ~  weight 8/11 = 256# ~  weight 12/11 = 263#... we reviewed diet + exercise thereapy. ~  Weight 6/12 = 271# ~  Weight 1/13 = 266# ~  Weight 6/13 = 255# ~  Weight 11/13 = 245# ~  Weight 4/14 = 256# ~  Weight 7/14 = 247# ~  Weight 11/14 = 218# ~  Weight 2/15 = 215#  GERD (ICD-530.81) - on OMEPRAZOLE  20mg Bid... ?[facility] changed to Aciphex? ~  EGD 9/13 by DrHung showed intact Nissen fundoplication, normal esoph, gastric mucosa, and duod  IBS (ICD-564.1) - colonoscopy was 9/07 by Demetra Shiner and showed hems only... 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 ~  Colonoscopy 9/13 by DrStark was normal- no divertics, AVMs, polyps, or other lesions... ~  11/14: presents w/ LLQ pain & sl tenderness x several weeks- denies n/v, +constip going 1-2 per wk and assoc w/ fair appetite and 29# weight  loss; GI hx as noted; She had CT Pelvis 9/14 in ER w/ avulsion fracture of the greater trochanter of the proximal right femur;  Exam shows tender to palp in LLQ, no mass felt => we decided to check labs and proceed w/ CT Abd => NEG x for 3cm cystic lesion in left inguinal region => refer to [facility] for their review.  RENAL INSUFFICIENCY (ICD-588.9) - Creat in the 1.6-1.7 range... ~  labs 2/09 showed BUN= 16, Creat= 1.6 ~  labs 3/10 showed BUN= 20, Creat= 1.7 ~  labs 4/11 showed BUN= 15, Creat= 1.6 ~  labs in hosp 6/11 showed BUN= 9, Creat= 1.3 ~  Labs here 6/12 showed BUN= 21, Creat= 1.6 ~  Labs 1/13 on Lasix40Bid showed BUN=15, Creat= 1.7 ~  Labs 6/13 on Lasix40Bid showed BUN= 21, Creat= 1.7 ~  Labs 9/13 in hosp showed Creat= 1.2-1.3 ~  Labs 11/13 on Lasix40Bid showed BUN= 25, Creat= 1.8 ~  Labs 3/14 on Lasix40Bid+K20Bid showed K=5.1, BUN=22, Cr=1.7 ~  Labs 11/14 on Lasix40Bid+K20Bid showed K=4.8, Cr=1.3 ~  Labs 12/14 on same meds showed Cr=1.6  OTHER SYMPTOMS INVOLVING URINARY SYSTEM (ICD-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 she responded to [place] 8mg  for overact bladder symptoms==> no longer on her med list...  DEGENERATIVE JOINT DISEASE >> LOW BACK PAIN, CHRONIC - eval and treated by Rayford Halsted, now DrBeekman> "they do it all now" w/ rechecks Q3-83months... she takes OXYCODONE 5/325, NEURONTIN 600mg Bid, etc... COMPRESSION FRACTURE - found to have compression T12 & L2 4/11 treated by NS- DrTooke... CHRONIC PAIN SYNDROME - managed by Renaee Munda... FIBROMYALGIA - on AMITRIPTYLINE 50mg - 2Qhs & FLEXERIL 10mg  Tid... PAGET'S DISEASE - on Observation per [facility], Rheumatology... ~  labs 2/09 showed AlkPhos= 79 ~  labs 3/10 showed AlkPhos= 86 ~  labs 4/11 showed AlkPhos= 70 ~  labs 6/11 in hosp showed AlkPhos = 71 ~  Bone Scan 6/11 showed stable Paget's distal left tibia, compress fx L2, OA of both knees. ~  Labs 6/13 showed AlkPhos= 86,  remains wnl... ~  Labs 3/14 showed AlkPhos= 108 ~  We do not have notes from Rheum to review...  SYNCOPE >> Adm 4/14 by [facility]- assoc w/ postural change by history & UTI via ER... ~  4/14:  CT Head in ER showed mild diffuse atrophy & vol loss w/ microvasc ischemic dis, no acute intracranial process... ~  4/14:  No postural BP changes in office today; she has been treated for UTI found in ER last week; we will order MRI & CDopplers; she will f/u w/ Cards... 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    ANXIETY - on ALPRAZOLAM 0.5mg Qid prn...  Hx of  HERPES ZOSTER (ICD-053.9), & POSTHERPETIC NEURALGIA (ICD-053.19)  ANEMIA >>  ~  Labs 11/13 showed Hg=10.7, MCV=92, Fe=88 (21%sat)... ~  Labs 3/14 showed Hg=11.4, MCV=91, Fe=62 (17%sat)... ~  Labs 11/14 showed Hg=10.5, MCV=95 ~  Labs 12/14 showed Hg= 9.5.Marland KitchenMarland Kitchen Continue supplements...   Past Surgical History  Procedure Laterality Date  . Vesicovaginal fistula closure w/ tah    . Cardiac catheterization  01/06/2010    RCA mid artery stenosis at 99%, LAD proximal occlusion 90%  at origin of ramus  . Nissen fundoplication    . Esophagogastroduodenoscopy  04/09/2012    Procedure: ESOPHAGOGASTRODUODENOSCOPY (EGD);  Surgeon: Beryle Beams, MD;  Location: [facility] ENDOSCOPY;  Service: Endoscopy;  Laterality: N/A;  tentatively scheduled per Trula Slade due to patients breathing problems  . Colonoscopy  04/11/2012    Procedure: COLONOSCOPY;  Surgeon: Ladene Artist, MD,FACG;  Location: [facility] ENDOSCOPY;  Service: Endoscopy;  Laterality: N/A;  . Abdominal hysterectomy  at 73 years old  . Appendectomy  about 50 years ago  . Coronary artery bypass graft  1997    LIMA to LAD, SVG to ramus, SVG to RCA    . Cholecystectomy  50 years ago  . Neck surgery  73 years old    full movement  . Right knee arthroscopy  3 years ago  . Eye surgery  2005    cataracts both eyes  . Back surgery      x5, "birdcage" and fused in lower back  . Total knee arthroplasty  06/28/2012    Procedure: TOTAL KNEE ARTHROPLASTY;  Surgeon: Sydnee Cabal, MD;  Location: [facility] ORS;  Service: Orthopedics;  Laterality: Left;  . Joint replacement Left 06/28/12    knee replacement    Outpatient Encounter Prescriptions as of 08/25/2013  Medication Sig  . docusate sodium (COLACE) 100 MG capsule Take 200 mg by mouth at bedtime.  . furosemide (LASIX) 40 MG tablet Take 40 mg by mouth 2 (two) times daily.   Marland Kitchen gabapentin (NEURONTIN) 600 MG tablet TAKE 1 TABLET FOUR TIMES A DAY  . guaiFENesin (MUCINEX) 600 MG 12 hr tablet Take 1,200 mg by mouth 2 (two) times daily.  . isosorbide mononitrate (IMDUR) 30 MG 24 hr tablet Take 1 tablet (30 mg total) by mouth at bedtime.  . metoprolol (LOPRESSOR) 50 MG tablet Take 0.5 tablets (25 mg total) by mouth 2 (two) times daily.  . Multiple Vitamin (MULTIVITAMIN WITH MINERALS) TABS Take 1 tablet by mouth daily.  Marland Kitchen NITROSTAT 0.4 MG SL tablet PLACE 1 TAB UNDER TONGUE EVERY 5 MINUTES TIL CHEST PAIN IS GONE.Marland KitchenUPTO 3 DOSES..NO RELIEF.GET HELP  . omeprazole (PRILOSEC) 20 MG capsule TAKE 1 CAPSULE (20 MG TOTAL) BY MOUTH 2 (TWO) TIMES DAILY.  Marland Kitchen oxyCODONE-acetaminophen (PERCOCET/ROXICET) 5-325 MG per tablet Take 2 tablets by mouth every 4 (four) hours as needed for pain.  . potassium chloride SA (KLOR-CON M20) 20 MEQ tablet TAKE 1 TABLET TWICE A DAY  . promethazine (PHENERGAN) 25 MG tablet Take 25 mg by mouth every 6 (six) hours as needed for nausea.  . Simethicone (GAS-X PO) Take 1 tablet by mouth daily as needed (gas).  . simvastatin (ZOCOR) 20 MG tablet TAKE 1 TABLET AT BEDTIME  . warfarin (COUMADIN) 5 MG tablet Take 1 tablet by mouth daily or as directed  . albuterol (PROVENTIL) (2.5 MG/3ML) 0.083%  nebulizer solution USE IN NEB TREATMENT UP TO FOUR TIMES A DAY AS NEEDED FOR WHEEZING...  . ALPRAZolam (XANAX) 0.5 MG tablet TAKE 1/2 TO 1 TABLET  BY MOUTH 4 TIMES A DAY AS NEEDED  . amitriptyline (ELAVIL) 50 MG tablet Take 100 mg by mouth at bedtime.  Marland Kitchen aspirin EC 81 MG tablet Take 81 mg by mouth every evening.   . Calcium Carbonate-Vitamin D (CALCIUM-VITAMIN D) 500-200 MG-UNIT per tablet Take 1 tablet by mouth 2 (two) times daily with a meal.  . chlorpheniramine-HYDROcodone (TUSSIONEX PENNKINETIC ER) 10-8 MG/5ML LQCR Take 5 mLs by mouth every 12 (twelve) hours as needed for cough.  . ferrous sulfate 325 (65 FE) MG tablet Take 325 mg by mouth every evening.  . mometasone-formoterol (DULERA) 200-5 MCG/ACT AERO Inhale 2 puffs into the lungs 2 (two) times daily.  . [DISCONTINUED] levofloxacin (LEVAQUIN) 750 MG tablet Take 1 tablet (750 mg total) by mouth daily.  . [DISCONTINUED] predniSONE (DELTASONE) 20 MG tablet Take as directed  . [DISCONTINUED] sulfamethoxazole-trimethoprim (BACTRIM DS) 800-160 MG per tablet Take 1 tablet by mouth 2 (two) times daily.  . [DISCONTINUED] warfarin (COUMADIN) 5 MG tablet Take 1 tablet by mouth daily or as directed  . [DISCONTINUED] warfarin (COUMADIN) 5 MG tablet Take 1 tablet by mouth daily or as directed.    No Known Allergies   Current Medications, Allergies, Past Medical History, Past Surgical History, Family History, and Social History were reviewed in [employer] record.    Review of Systems         See HPI - all other systems neg except as noted... The patient complains of decreased hearing, dyspnea on exertion, LLQ abd pain, headaches, incontinence, and depression.  The patient denies anorexia, fever, weight loss, weight gain, vision loss, hoarseness, chest pain, syncope, peripheral edema, prolonged cough, hemoptysis, melena, hematochezia, severe indigestion/heartburn, hematuria, muscle weakness, suspicious skin lesions, transient  blindness, difficulty walking, unusual weight change, abnormal bleeding, enlarged lymph nodes, and angioedema.    Objective:   Physical Exam     WD, Obese, 73 y/o WF in NAD... GENERAL:  Alert & oriented; pleasant & cooperative... HEENT:  [place]/AT, EOM- full, PERRLA, EACs-clear, TMs-wnl, NOSE-clear, THROAT-clear & wnl. NECK:  Supple w/ fairROM; no JVD; normal carotid impulses w/o bruits; no thyromegaly or nodules palpated; no lymphadenopathy. CHEST:  Coarse BS w/ no wheezing, no rales, no signs of consolidation... HEART:  Regular Rhythm; without murmurs/ rubs/ or gallops heard... ABDOMEN:  Obese, soft & nontender; min LLQ tender on palp, normal bowel sounds, no organomegaly or masses detected. EXT: without deformities, mild arthritic changes; no varicose veins/ +venous insuffic/ 1+ edema. NEURO:  no focal neuro deficits... DERM:  along right lateral chest wall, under breast & over the costal margin- scarring in skin from shingles rash...  RADIOLOGY DATA:  Reviewed in the EPIC EMR & discussed w/ the patient...  LABORATORY DATA:  Reviewed in the EPIC EMR & discussed w/ the patient...   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 For persistent symptoms we discussed starting DULERA200-5 2spBid, NEBS Bid, Mucinex1200Bid, Delsym, Tussionex, Fluids...    COPD>  Stable overall despite her chronic symptoms> continue NEBS, Mucinex, Fluids;  PFTs look more restricted than obstructed 7 she is advised to lose wt 7 concentrate on good deep breaths...  ASHD/ CAD/ s/p CABG>  Followed by Cyndra Numbers [facility]; continue same meds; needs cardiac w/u for poss causes of Syncope...  AFIB>  On Coumadin per [facility], continue same Rx...  HYPERLIPIDEMIA>  FLP looks great, continue Simva20...  OBESITY>  We reviewed diet &  exercise program; weight is coming down but she's not sure why?  GI Bleed>  Hosp 9/13 w/ Anemia & GI bleed; neg  EGD & Colon, no source found...  LLQ Abd pain ?etiology> she has fair appetite, 29# wt loss over 62mo, and we are checking CT Abd => NEG x for 3cm cystic lesion in left inguinal region => refer to [facility] for their review  Renal Insuffic>  Creat= 1.3 (prev 1.4-1.6 on her current regimen); advised to drink plenty of water & maintain hydration...  ORTHO>  LBP/ Compression fx/ FM/ Paget's/ Chr Pain Syndrome> managed by [name] at [facility] he took over for [facility]; we discussed checking w/ DrBeekman before considering Ortho surg...  Anxiety>  On Alpraz prn...  Anemia>  She was Tx 2u in hosp 9/13; Labs showed Hg= 9.5; continue supplements...   Patient's Medications  New Prescriptions   MOMETASONE-FORMOTEROL (DULERA) 200-5 MCG/ACT AERO    Inhale 2 puffs into the lungs 2 (two) times daily.  Previous Medications   ALBUTEROL (PROVENTIL) (2.5 MG/3ML) 0.083% NEBULIZER SOLUTION    USE IN NEB TREATMENT UP TO FOUR TIMES A DAY AS NEEDED FOR WHEEZING...   ALPRAZOLAM (XANAX) 0.5 MG TABLET    TAKE 1/2 TO 1 TABLET BY MOUTH 4 TIMES A DAY AS NEEDED   AMITRIPTYLINE (ELAVIL) 50 MG TABLET    Take 100 mg by mouth at bedtime.   ASPIRIN EC 81 MG TABLET    Take 81 mg by mouth every evening.    CALCIUM CARBONATE-VITAMIN D (CALCIUM-VITAMIN D) 500-200 MG-UNIT PER TABLET    Take 1 tablet by mouth 2 (two) times daily with a meal.   CHLORPHENIRAMINE-HYDROCODONE (TUSSIONEX PENNKINETIC ER) 10-8 MG/5ML)    Take 5 mLs by mouth every 12 (twelve) hours as needed for cough.   DOCUSATE SODIUM (COLACE) 100 MG CAPSULE    Take 200 mg by mouth at bedtime.   FERROUS SULFATE 325 (65 FE) MG TABLET    Take 325 mg by mouth every evening.   FUROSEMIDE (LASIX) 40 MG TABLET    Take 40 mg by mouth 2 (two) times daily.    GABAPENTIN (NEURONTIN) 600 MG TABLET    TAKE 1 TABLET FOUR TIMES A DAY   GUAIFENESIN (MUCINEX) 600 MG 12 HR TABLET    Take 1,200 mg by mouth 2 (two) times daily.   ISOSORBIDE MONONITRATE (IMDUR) 30 MG 24 HR TABLET    Take 1  tablet (30 mg total) by mouth at bedtime.   METOPROLOL (LOPRESSOR) 50 MG TABLET    Take 0.5 tablets (25 mg total) by mouth 2 (two) times daily.   MULTIPLE VITAMIN (MULTIVITAMIN WITH MINERALS) TABS    Take 1 tablet by mouth daily.   NITROSTAT 0.4 MG SL TABLET    PLACE 1 TAB UNDER TONGUE EVERY 5 MINUTES TIL CHEST PAIN IS GONE.Marland KitchenUPTO 3 DOSES..NO RELIEF.GET HELP   OMEPRAZOLE (PRILOSEC) 20 MG CAPSULE    TAKE 1 CAPSULE (20 MG TOTAL) BY MOUTH 2 (TWO) TIMES DAILY.   OXYCODONE-ACETAMINOPHEN (PERCOCET/ROXICET) 5-325 MG PER TABLET    Take 2 tablets by mouth every 4 (four) hours as needed for pain.   POTASSIUM CHLORIDE SA (KLOR-CON M20) 20 MEQ TABLET    TAKE 1 TABLET TWICE A DAY   PROMETHAZINE (PHENERGAN) 25 MG TABLET    Take 25 mg by mouth every 6 (six) hours as needed for nausea.   SIMETHICONE (GAS-X PO)    Take 1 tablet by mouth daily as needed (gas).   SIMVASTATIN (ZOCOR)  20 MG TABLET    TAKE 1 TABLET AT BEDTIME   WARFARIN (COUMADIN) 5 MG TABLET    Take 1 tablet by mouth daily or as directed  Modified Medications   No medications on file  Discontinued Medications   LEVOFLOXACIN (LEVAQUIN) 750 MG TABLET    Take 1 tablet (750 mg total) by mouth daily.   PREDNISONE (DELTASONE) 20 MG TABLET    Take as directed   SULFAMETHOXAZOLE-TRIMETHOPRIM (BACTRIM DS) 800-160 MG PER TABLET    Take 1 tablet by mouth 2 (two) times daily.

## 2013-08-28 ENCOUNTER — Telehealth: Payer: Self-pay | Admitting: *Deleted

## 2013-08-28 LAB — BASIC METABOLIC PANEL
BUN: 17 mg/dL (ref 6–23)
CALCIUM: 9.3 mg/dL (ref 8.4–10.5)
CO2: 28 meq/L (ref 19–32)
Chloride: 102 mEq/L (ref 96–112)
Creatinine, Ser: 1.4 mg/dL — ABNORMAL HIGH (ref 0.4–1.2)
GFR: 39.3 mL/min — AB (ref >60.00)
Glucose, Bld: 95 mg/dL (ref 70–99)
Potassium: 4.8 mEq/L (ref 3.5–5.1)
SODIUM: 137 meq/L (ref 135–145)

## 2013-08-29 ENCOUNTER — Ambulatory Visit: Payer: Medicare Other | Admitting: Pharmacist Clinician (PhC)/ Clinical Pharmacy Specialist

## 2013-08-29 LAB — RESPIRATORY CULTURE OR RESPIRATORY AND SPUTUM CULTURE

## 2013-08-30 ENCOUNTER — Telehealth: Payer: Self-pay

## 2013-08-30 ENCOUNTER — Other Ambulatory Visit: Payer: Self-pay

## 2013-08-30 MED ORDER — BUDESONIDE-FORMOTEROL FUMARATE 160-4.5 MCG/ACT IN AERO: 2.0000 | INHALATION_SPRAY | Freq: Two times a day (BID) | RESPIRATORY_TRACT | Status: DC

## 2013-08-30 NOTE — Telephone Encounter (Signed)
Dulera 200/5 was not covered by [employer].  Insurance will cover symbicort 160.  Per SN it's ok to substitute dulera for symbicort.  Medication has been dx'ed and symbicort has been sent.  Pt aware.  Nothing further needed.

## 2013-08-31 ENCOUNTER — Ambulatory Visit (INDEPENDENT_AMBULATORY_CARE_PROVIDER_SITE_OTHER)
Admission: RE | Admit: 2013-08-31 | Discharge: 2013-08-31 | Disposition: A | Discharge status: Home / Self Care | Payer: Medicare Other | Source: Ambulatory Visit | Attending: Pulmonary Disease | Admitting: Pulmonary Disease

## 2013-08-31 ENCOUNTER — Ambulatory Visit (INDEPENDENT_AMBULATORY_CARE_PROVIDER_SITE_OTHER): Payer: Medicare Other | Admitting: Pharmacist Clinician (PhC)/ Clinical Pharmacy Specialist

## 2013-08-31 VITALS — BP 130/66 | HR 88

## 2013-08-31 DIAGNOSIS — I4821 Permanent atrial fibrillation: Secondary | ICD-10-CM

## 2013-08-31 DIAGNOSIS — Z7901 Long term (current) use of anticoagulants: Secondary | ICD-10-CM

## 2013-08-31 DIAGNOSIS — I4891 Unspecified atrial fibrillation: Secondary | ICD-10-CM

## 2013-08-31 DIAGNOSIS — R0602 Shortness of breath: Secondary | ICD-10-CM

## 2013-08-31 DIAGNOSIS — J449 Chronic obstructive pulmonary disease, unspecified: Secondary | ICD-10-CM

## 2013-08-31 LAB — POCT INR: INR: 2.2

## 2013-08-31 MED ORDER — IOHEXOL 300 MG/ML  SOLN
80.0000 mL | Freq: Once | INTRAMUSCULAR | Status: AC | PRN
  Administered 2013-08-31: 80 mL via INTRAVENOUS

## 2013-09-03 ENCOUNTER — Inpatient Hospital Stay (HOSPITAL_COMMUNITY)
Admission: EM | Admit: 2013-09-03 | Discharge: 2013-09-07 | DRG: 871 | Disposition: A | Discharge status: Home / Self Care | Payer: Medicare Other | Attending: Internal Medicine | Admitting: Internal Medicine

## 2013-09-03 ENCOUNTER — Emergency Department (HOSPITAL_COMMUNITY): Payer: Medicare Other

## 2013-09-03 ENCOUNTER — Encounter (HOSPITAL_COMMUNITY): Payer: Self-pay | Admitting: Emergency Medicine

## 2013-09-03 DIAGNOSIS — D631 Anemia in chronic kidney disease: Secondary | ICD-10-CM | POA: Diagnosis present

## 2013-09-03 DIAGNOSIS — K219 Gastro-esophageal reflux disease without esophagitis: Secondary | ICD-10-CM | POA: Diagnosis present

## 2013-09-03 DIAGNOSIS — Z87891 Personal history of nicotine dependence: Secondary | ICD-10-CM

## 2013-09-03 DIAGNOSIS — E785 Hyperlipidemia, unspecified: Secondary | ICD-10-CM | POA: Diagnosis present

## 2013-09-03 DIAGNOSIS — G894 Chronic pain syndrome: Secondary | ICD-10-CM | POA: Diagnosis present

## 2013-09-03 DIAGNOSIS — E860 Dehydration: Secondary | ICD-10-CM

## 2013-09-03 DIAGNOSIS — J9690 Respiratory failure, unspecified, unspecified whether with hypoxia or hypercapnia: Secondary | ICD-10-CM | POA: Diagnosis present

## 2013-09-03 DIAGNOSIS — I2510 Atherosclerotic heart disease of native coronary artery without angina pectoris: Secondary | ICD-10-CM | POA: Diagnosis present

## 2013-09-03 DIAGNOSIS — N039 Chronic nephritic syndrome with unspecified morphologic changes: Secondary | ICD-10-CM

## 2013-09-03 DIAGNOSIS — E876 Hypokalemia: Secondary | ICD-10-CM | POA: Diagnosis present

## 2013-09-03 DIAGNOSIS — R579 Shock, unspecified: Secondary | ICD-10-CM | POA: Diagnosis present

## 2013-09-03 DIAGNOSIS — I129 Hypertensive chronic kidney disease with stage 1 through stage 4 chronic kidney disease, or unspecified chronic kidney disease: Secondary | ICD-10-CM | POA: Diagnosis present

## 2013-09-03 DIAGNOSIS — J449 Chronic obstructive pulmonary disease, unspecified: Secondary | ICD-10-CM

## 2013-09-03 DIAGNOSIS — E872 Acidosis, unspecified: Secondary | ICD-10-CM | POA: Diagnosis present

## 2013-09-03 DIAGNOSIS — A419 Sepsis, unspecified organism: Principal | ICD-10-CM

## 2013-09-03 DIAGNOSIS — J441 Chronic obstructive pulmonary disease with (acute) exacerbation: Secondary | ICD-10-CM | POA: Diagnosis present

## 2013-09-03 DIAGNOSIS — Z951 Presence of aortocoronary bypass graft: Secondary | ICD-10-CM

## 2013-09-03 DIAGNOSIS — J189 Pneumonia, unspecified organism: Secondary | ICD-10-CM

## 2013-09-03 DIAGNOSIS — D638 Anemia in other chronic diseases classified elsewhere: Secondary | ICD-10-CM

## 2013-09-03 DIAGNOSIS — N170 Acute kidney failure with tubular necrosis: Secondary | ICD-10-CM | POA: Diagnosis present

## 2013-09-03 DIAGNOSIS — J9691 Respiratory failure, unspecified with hypoxia: Secondary | ICD-10-CM

## 2013-09-03 DIAGNOSIS — I5032 Chronic diastolic (congestive) heart failure: Secondary | ICD-10-CM | POA: Diagnosis present

## 2013-09-03 DIAGNOSIS — N183 Chronic kidney disease, stage 3 unspecified: Secondary | ICD-10-CM

## 2013-09-03 DIAGNOSIS — I4891 Unspecified atrial fibrillation: Secondary | ICD-10-CM

## 2013-09-03 DIAGNOSIS — J9600 Acute respiratory failure, unspecified whether with hypoxia or hypercapnia: Secondary | ICD-10-CM | POA: Diagnosis present

## 2013-09-03 DIAGNOSIS — I509 Heart failure, unspecified: Secondary | ICD-10-CM | POA: Diagnosis present

## 2013-09-03 DIAGNOSIS — J9601 Acute respiratory failure with hypoxia: Secondary | ICD-10-CM | POA: Diagnosis present

## 2013-09-03 DIAGNOSIS — Z96659 Presence of unspecified artificial knee joint: Secondary | ICD-10-CM

## 2013-09-03 DIAGNOSIS — Z7901 Long term (current) use of anticoagulants: Secondary | ICD-10-CM

## 2013-09-03 HISTORY — DX: Acute myocardial infarction, unspecified: I21.9

## 2013-09-03 HISTORY — DX: Anemia, unspecified: D64.9

## 2013-09-03 LAB — POCT I-STAT 3, VENOUS BLOOD GAS (G3P V)
Acid-Base Excess: 4 mmol/L — ABNORMAL HIGH (ref 0.0–2.0)
Bicarbonate: 28.6 mEq/L — ABNORMAL HIGH (ref 20.0–24.0)
O2 SAT: 43 %
PCO2 VEN: 43.4 mmHg — AB (ref 45.0–50.0)
PH VEN: 7.427 — AB (ref 7.250–7.300)
PO2 VEN: 24 mmHg — AB (ref 30.0–45.0)
Patient temperature: 37
TCO2: 30 mmol/L (ref 0–100)

## 2013-09-03 LAB — COMPREHENSIVE METABOLIC PANEL
ALBUMIN: 3.3 g/dL — AB (ref 3.5–5.2)
ALT: 12 U/L (ref 0–35)
ALT: 12 U/L (ref 0–35)
AST: 23 U/L (ref 0–37)
AST: 24 U/L (ref 0–37)
Albumin: 3.1 g/dL — ABNORMAL LOW (ref 3.5–5.2)
Alkaline Phosphatase: 108 U/L (ref 39–117)
Alkaline Phosphatase: 97 U/L (ref 39–117)
BILIRUBIN TOTAL: 1.2 mg/dL (ref 0.3–1.2)
BUN: 18 mg/dL (ref 6–23)
BUN: 20 mg/dL (ref 6–23)
CALCIUM: 8.9 mg/dL (ref 8.4–10.5)
CO2: 27 mEq/L (ref 19–32)
CO2: 22 meq/L (ref 19–32)
CREATININE: 1.37 mg/dL — AB (ref 0.50–1.10)
Calcium: 8.3 mg/dL — ABNORMAL LOW (ref 8.4–10.5)
Chloride: 97 mEq/L (ref 96–112)
Chloride: 102 mEq/L (ref 96–112)
Creatinine, Ser: 1.47 mg/dL — ABNORMAL HIGH (ref 0.50–1.10)
GFR calc Af Amer: 40 mL/min — ABNORMAL LOW (ref >90)
GFR calc Af Amer: 44 mL/min — ABNORMAL LOW (ref >90)
GFR calc non Af Amer: 35 mL/min — ABNORMAL LOW (ref >90)
GFR, EST NON AFRICAN AMERICAN: 38 mL/min — AB (ref >90)
Glucose, Bld: 98 mg/dL (ref 70–99)
Glucose, Bld: 218 mg/dL — ABNORMAL HIGH (ref 70–99)
POTASSIUM: 4 meq/L (ref 3.7–5.3)
Potassium: 3.6 mEq/L — ABNORMAL LOW (ref 3.7–5.3)
SODIUM: 138 meq/L (ref 137–147)
Sodium: 140 mEq/L (ref 137–147)
Total Bilirubin: 1.3 mg/dL — ABNORMAL HIGH (ref 0.3–1.2)
Total Protein: 7 g/dL (ref 6.0–8.3)
Total Protein: 7 g/dL (ref 6.0–8.3)

## 2013-09-03 LAB — URINALYSIS, ROUTINE W REFLEX MICROSCOPIC
Bilirubin Urine: NEGATIVE
GLUCOSE, UA: NEGATIVE mg/dL
Hgb urine dipstick: NEGATIVE
KETONES UR: NEGATIVE mg/dL
Leukocytes, UA: NEGATIVE
Nitrite: NEGATIVE
PH: 6.5 (ref 5.0–8.0)
Protein, ur: NEGATIVE mg/dL
Specific Gravity, Urine: 1.011 (ref 1.005–1.030)
Urobilinogen, UA: 1 mg/dL (ref 0.0–1.0)

## 2013-09-03 LAB — CBC WITH DIFFERENTIAL/PLATELET
BASOS ABS: 0.1 10*3/uL (ref 0.0–0.1)
BASOS ABS: 0 10*3/uL (ref 0.0–0.1)
Basophils Relative: 1 % (ref 0–1)
Basophils Relative: 1 % (ref 0–1)
EOS ABS: 0.2 10*3/uL (ref 0.0–0.7)
EOS ABS: 0 10*3/uL (ref 0.0–0.7)
Eosinophils Relative: 2 % (ref 0–5)
Eosinophils Relative: 0 % (ref 0–5)
HCT: 26.7 % — ABNORMAL LOW (ref 36.0–46.0)
HEMATOCRIT: 25.2 % — AB (ref 36.0–46.0)
HEMOGLOBIN: 7.9 g/dL — AB (ref 12.0–15.0)
Hemoglobin: 8.2 g/dL — ABNORMAL LOW (ref 12.0–15.0)
Lymphocytes Relative: 14 % (ref 12–46)
Lymphocytes Relative: 9 % — ABNORMAL LOW (ref 12–46)
Lymphs Abs: 1.1 10*3/uL (ref 0.7–4.0)
Lymphs Abs: 0.6 10*3/uL — ABNORMAL LOW (ref 0.7–4.0)
MCH: 33.3 pg (ref 26.0–34.0)
MCH: 34.2 pg — AB (ref 26.0–34.0)
MCHC: 30.7 g/dL (ref 30.0–36.0)
MCHC: 31.3 g/dL (ref 30.0–36.0)
MCV: 108.5 fL — ABNORMAL HIGH (ref 78.0–100.0)
MCV: 109.1 fL — AB (ref 78.0–100.0)
MONO ABS: 1.1 10*3/uL — AB (ref 0.1–1.0)
MONO ABS: 0.5 10*3/uL (ref 0.1–1.0)
MONOS PCT: 7 % (ref 3–12)
Monocytes Relative: 14 % — ABNORMAL HIGH (ref 3–12)
NEUTROS ABS: 5.9 10*3/uL (ref 1.7–7.7)
NEUTROS PCT: 69 % (ref 43–77)
Neutro Abs: 5.3 10*3/uL (ref 1.7–7.7)
Neutrophils Relative %: 83 % — ABNORMAL HIGH (ref 43–77)
PLATELETS: 160 10*3/uL (ref 150–400)
Platelets: ADEQUATE 10*3/uL (ref 150–400)
RBC: 2.46 MIL/uL — ABNORMAL LOW (ref 3.87–5.11)
RBC: 2.31 MIL/uL — ABNORMAL LOW (ref 3.87–5.11)
RDW: 22.1 % — ABNORMAL HIGH (ref 11.5–15.5)
RDW: 22.2 % — AB (ref 11.5–15.5)
WBC: 7.8 10*3/uL (ref 4.0–10.5)
WBC: 7.1 10*3/uL (ref 4.0–10.5)

## 2013-09-03 LAB — PROTIME-INR
INR: 1.9 — ABNORMAL HIGH (ref 0.00–1.49)
INR: 2 — AB (ref 0.00–1.49)
Prothrombin Time: 21.2 seconds — ABNORMAL HIGH (ref 11.6–15.2)
Prothrombin Time: 22.1 seconds — ABNORMAL HIGH (ref 11.6–15.2)

## 2013-09-03 LAB — LACTIC ACID, PLASMA
LACTIC ACID, VENOUS: 2.2 mmol/L (ref 0.5–2.2)
Lactic Acid, Venous: 2.4 mmol/L — ABNORMAL HIGH (ref 0.5–2.2)

## 2013-09-03 LAB — PRO B NATRIURETIC PEPTIDE: PRO B NATRI PEPTIDE: 14053 pg/mL — AB (ref 0–125)

## 2013-09-03 LAB — APTT
aPTT: 58 seconds — ABNORMAL HIGH (ref 24–37)
aPTT: 58 seconds — ABNORMAL HIGH (ref 24–37)

## 2013-09-03 LAB — PROCALCITONIN: Procalcitonin: 26.4 ng/mL

## 2013-09-03 LAB — PHOSPHORUS: PHOSPHORUS: 3.1 mg/dL (ref 2.3–4.6)

## 2013-09-03 LAB — TROPONIN I: Troponin I: <0.3 ng/mL (ref <0.30)

## 2013-09-03 LAB — RAPID STREP SCREEN (MED CTR MEBANE ONLY): STREPTOCOCCUS, GROUP A SCREEN (DIRECT): NEGATIVE

## 2013-09-03 LAB — CG4 I-STAT (LACTIC ACID): Lactic Acid, Venous: 1.74 mmol/L (ref 0.5–2.2)

## 2013-09-03 LAB — INFLUENZA PANEL BY PCR (TYPE A & B)
H1N1 flu by pcr: NOT DETECTED
Influenza A By PCR: NEGATIVE
Influenza B By PCR: NEGATIVE

## 2013-09-03 LAB — MRSA PCR SCREENING: MRSA by PCR: POSITIVE — AB

## 2013-09-03 LAB — MAGNESIUM: Magnesium: 1.9 mg/dL (ref 1.5–2.5)

## 2013-09-03 LAB — STREP PNEUMONIAE URINARY ANTIGEN: STREP PNEUMO URINARY ANTIGEN: NEGATIVE

## 2013-09-03 MED ORDER — PANTOPRAZOLE SODIUM 40 MG PO TBEC
40.0000 mg | DELAYED_RELEASE_TABLET | Freq: Every day | ORAL | Status: DC
  Administered 2013-09-03 – 2013-09-07 (×5): 40 mg via ORAL
  Filled 2013-09-03 (×5): qty 1

## 2013-09-03 MED ORDER — GABAPENTIN 600 MG PO TABS
600.0000 mg | ORAL_TABLET | Freq: Four times a day (QID) | ORAL | Status: DC
  Administered 2013-09-03 (×3): 600 mg via ORAL
  Filled 2013-09-03 (×12): qty 1

## 2013-09-03 MED ORDER — ALBUTEROL SULFATE (2.5 MG/3ML) 0.083% IN NEBU: 2.5000 mg | INHALATION_SOLUTION | RESPIRATORY_TRACT | Status: DC

## 2013-09-03 MED ORDER — VANCOMYCIN HCL 10 G IV SOLR
1500.0000 mg | INTRAVENOUS | Status: DC
  Administered 2013-09-04 – 2013-09-05 (×2): 1500 mg via INTRAVENOUS
  Filled 2013-09-03 (×3): qty 1500

## 2013-09-03 MED ORDER — DOCUSATE SODIUM 100 MG PO CAPS
200.0000 mg | ORAL_CAPSULE | Freq: Every day | ORAL | Status: DC
  Administered 2013-09-03 – 2013-09-06 (×4): 200 mg via ORAL
  Filled 2013-09-03 (×5): qty 2

## 2013-09-03 MED ORDER — GUAIFENESIN ER 600 MG PO TB12
1200.0000 mg | ORAL_TABLET | Freq: Two times a day (BID) | ORAL | Status: DC
  Administered 2013-09-03 – 2013-09-07 (×9): 1200 mg via ORAL
  Filled 2013-09-03 (×11): qty 2

## 2013-09-03 MED ORDER — POTASSIUM CHLORIDE CRYS ER 20 MEQ PO TBCR
20.0000 meq | EXTENDED_RELEASE_TABLET | Freq: Two times a day (BID) | ORAL | Status: DC
  Administered 2013-09-03 (×2): 20 meq via ORAL
  Filled 2013-09-03 (×3): qty 1

## 2013-09-03 MED ORDER — ACETAMINOPHEN 325 MG PO TABS
650.0000 mg | ORAL_TABLET | Freq: Four times a day (QID) | ORAL | Status: DC | PRN
  Administered 2013-09-04: 650 mg via ORAL
  Filled 2013-09-03: qty 2

## 2013-09-03 MED ORDER — HYDROCOD POLST-CHLORPHEN POLST 10-8 MG/5ML PO LQCR
5.0000 mL | Freq: Two times a day (BID) | ORAL | Status: DC | PRN
  Administered 2013-09-03: 5 mL via ORAL
  Filled 2013-09-03: qty 5

## 2013-09-03 MED ORDER — ALPRAZOLAM 0.25 MG PO TABS
0.2500 mg | ORAL_TABLET | Freq: Four times a day (QID) | ORAL | Status: DC | PRN
  Administered 2013-09-04 (×3): 0.25 mg via ORAL
  Administered 2013-09-05: 0.5 mg via ORAL
  Administered 2013-09-06: 0.25 mg via ORAL
  Filled 2013-09-03: qty 2
  Filled 2013-09-03 (×2): qty 1
  Filled 2013-09-03: qty 2

## 2013-09-03 MED ORDER — CALCIUM-VITAMIN D 500-200 MG-UNIT PO TABS: 1.0000 | ORAL_TABLET | Freq: Two times a day (BID) | ORAL | Status: DC

## 2013-09-03 MED ORDER — METOPROLOL TARTRATE 25 MG PO TABS: 25.0000 mg | ORAL_TABLET | Freq: Once | ORAL | Status: DC

## 2013-09-03 MED ORDER — IPRATROPIUM BROMIDE 0.02 % IN SOLN
0.5000 mg | RESPIRATORY_TRACT | Status: DC | PRN
  Administered 2013-09-03: 0.5 mg via RESPIRATORY_TRACT
  Filled 2013-09-03: qty 2.5

## 2013-09-03 MED ORDER — METHYLPREDNISOLONE SODIUM SUCC 125 MG IJ SOLR
60.0000 mg | Freq: Four times a day (QID) | INTRAMUSCULAR | Status: DC
  Administered 2013-09-03 (×3): 60 mg via INTRAVENOUS
  Filled 2013-09-03 (×3): qty 2

## 2013-09-03 MED ORDER — SIMETHICONE 80 MG PO CHEW
80.0000 mg | CHEWABLE_TABLET | Freq: Four times a day (QID) | ORAL | Status: DC | PRN
  Filled 2013-09-03: qty 1

## 2013-09-03 MED ORDER — IPRATROPIUM BROMIDE 0.02 % IN SOLN
0.5000 mg | Freq: Once | RESPIRATORY_TRACT | Status: AC
Start: 2013-09-03 — End: 2013-09-03
  Administered 2013-09-03: 0.5 mg via RESPIRATORY_TRACT
  Filled 2013-09-03: qty 2.5

## 2013-09-03 MED ORDER — IPRATROPIUM-ALBUTEROL 0.5-2.5 (3) MG/3ML IN SOLN
3.0000 mL | RESPIRATORY_TRACT | Status: DC
  Administered 2013-09-03 – 2013-09-04 (×7): 3 mL via RESPIRATORY_TRACT
  Filled 2013-09-03 (×7): qty 3

## 2013-09-03 MED ORDER — ALBUTEROL SULFATE (2.5 MG/3ML) 0.083% IN NEBU
5.0000 mg | INHALATION_SOLUTION | RESPIRATORY_TRACT | Status: AC
  Administered 2013-09-03 (×3): 5 mg via RESPIRATORY_TRACT
  Filled 2013-09-03 (×3): qty 6

## 2013-09-03 MED ORDER — CHLORHEXIDINE GLUCONATE CLOTH 2 % EX PADS
6.0000 | MEDICATED_PAD | Freq: Every day | CUTANEOUS | Status: DC
  Administered 2013-09-04 – 2013-09-07 (×4): 6 via TOPICAL

## 2013-09-03 MED ORDER — ONDANSETRON HCL 4 MG/2ML IJ SOLN: 4.0000 mg | Freq: Four times a day (QID) | INTRAMUSCULAR | Status: DC | PRN

## 2013-09-03 MED ORDER — OXYCODONE-ACETAMINOPHEN 5-325 MG PO TABS
2.0000 | ORAL_TABLET | ORAL | Status: DC | PRN
  Administered 2013-09-03: 2 via ORAL
  Filled 2013-09-03: qty 2

## 2013-09-03 MED ORDER — SODIUM CHLORIDE 0.9 % IV SOLN
INTRAVENOUS | Status: DC
Start: 2013-09-03 — End: 2013-09-05
  Administered 2013-09-03 – 2013-09-04 (×2): via INTRAVENOUS

## 2013-09-03 MED ORDER — CHOLECALCIFEROL 10 MCG (400 UNIT) PO TABS
400.0000 [IU] | ORAL_TABLET | Freq: Every day | ORAL | Status: DC
  Administered 2013-09-03 – 2013-09-07 (×5): 400 [IU] via ORAL
  Filled 2013-09-03 (×5): qty 1

## 2013-09-03 MED ORDER — VANCOMYCIN HCL 10 G IV SOLR
2000.0000 mg | Freq: Once | INTRAVENOUS | Status: AC
  Administered 2013-09-03: 2000 mg via INTRAVENOUS
  Filled 2013-09-03: qty 2000

## 2013-09-03 MED ORDER — ADULT MULTIVITAMIN W/MINERALS CH
1.0000 | ORAL_TABLET | Freq: Every day | ORAL | Status: DC
  Administered 2013-09-03 – 2013-09-07 (×5): 1 via ORAL
  Filled 2013-09-03 (×5): qty 1

## 2013-09-03 MED ORDER — SODIUM CHLORIDE 0.9 % IV BOLUS (SEPSIS): 1000.0000 mL | INTRAVENOUS | Status: DC | PRN

## 2013-09-03 MED ORDER — ACETAMINOPHEN 650 MG RE SUPP: 650.0000 mg | Freq: Four times a day (QID) | RECTAL | Status: DC | PRN

## 2013-09-03 MED ORDER — MORPHINE SULFATE 2 MG/ML IJ SOLN
1.0000 mg | INTRAMUSCULAR | Status: DC | PRN
  Administered 2013-09-03: 1 mg via INTRAVENOUS
  Filled 2013-09-03: qty 1

## 2013-09-03 MED ORDER — PIPERACILLIN-TAZOBACTAM 3.375 G IVPB
3.3750 g | Freq: Three times a day (TID) | INTRAVENOUS | Status: DC
  Administered 2013-09-03 – 2013-09-07 (×12): 3.375 g via INTRAVENOUS
  Filled 2013-09-03 (×15): qty 50

## 2013-09-03 MED ORDER — LEVOFLOXACIN IN D5W 750 MG/150ML IV SOLN: 750.0000 mg | INTRAVENOUS | Status: DC

## 2013-09-03 MED ORDER — WARFARIN SODIUM 6 MG PO TABS
6.0000 mg | ORAL_TABLET | Freq: Once | ORAL | Status: AC
  Administered 2013-09-03: 6 mg via ORAL
  Filled 2013-09-03: qty 1

## 2013-09-03 MED ORDER — SODIUM CHLORIDE 0.9 % IV BOLUS (SEPSIS)
1000.0000 mL | Freq: Once | INTRAVENOUS | Status: AC
  Administered 2013-09-03: 1000 mL via INTRAVENOUS

## 2013-09-03 MED ORDER — SODIUM CHLORIDE 0.9 % IV SOLN
0.0300 [IU]/min | INTRAVENOUS | Status: DC
  Filled 2013-09-03: qty 2.5

## 2013-09-03 MED ORDER — WARFARIN - PHARMACIST DOSING INPATIENT
Freq: Every day | Status: DC
  Administered 2013-09-06: 18:00:00

## 2013-09-03 MED ORDER — CALCIUM CARBONATE 1250 (500 CA) MG PO TABS
500.0000 mg | ORAL_TABLET | Freq: Two times a day (BID) | ORAL | Status: DC
  Administered 2013-09-03 – 2013-09-07 (×8): 500 mg via ORAL
  Filled 2013-09-03 (×11): qty 1

## 2013-09-03 MED ORDER — ONDANSETRON HCL 4 MG PO TABS: 4.0000 mg | ORAL_TABLET | Freq: Four times a day (QID) | ORAL | Status: DC | PRN

## 2013-09-03 MED ORDER — IPRATROPIUM BROMIDE 0.02 % IN SOLN: 0.5000 mg | RESPIRATORY_TRACT | Status: DC

## 2013-09-03 MED ORDER — AMITRIPTYLINE HCL 100 MG PO TABS
100.0000 mg | ORAL_TABLET | Freq: Every day | ORAL | Status: DC
  Administered 2013-09-03: 100 mg via ORAL
  Filled 2013-09-03 (×2): qty 1

## 2013-09-03 MED ORDER — LEVOFLOXACIN IN D5W 500 MG/100ML IV SOLN
500.0000 mg | Freq: Once | INTRAVENOUS | Status: AC
  Administered 2013-09-03: 500 mg via INTRAVENOUS
  Filled 2013-09-03: qty 100

## 2013-09-03 MED ORDER — ALBUTEROL SULFATE (2.5 MG/3ML) 0.083% IN NEBU
2.5000 mg | INHALATION_SOLUTION | Freq: Four times a day (QID) | RESPIRATORY_TRACT | Status: DC
Start: 2013-09-03 — End: 2013-09-03

## 2013-09-03 MED ORDER — SODIUM CHLORIDE 0.9 % IV BOLUS (SEPSIS)
1000.0000 mL | Freq: Once | INTRAVENOUS | Status: AC
Start: 2013-09-03 — End: 2013-09-03
  Administered 2013-09-03: 1000 mL via INTRAVENOUS

## 2013-09-03 MED ORDER — SODIUM CHLORIDE 0.9 % IJ SOLN
3.0000 mL | Freq: Two times a day (BID) | INTRAMUSCULAR | Status: DC
  Administered 2013-09-03 – 2013-09-04 (×3): 3 mL via INTRAVENOUS

## 2013-09-03 MED ORDER — MUPIROCIN 2 % EX OINT
1.0000 "application " | TOPICAL_OINTMENT | Freq: Two times a day (BID) | CUTANEOUS | Status: DC
  Administered 2013-09-03 – 2013-09-07 (×8): 1 via NASAL
  Filled 2013-09-03: qty 22

## 2013-09-03 MED ORDER — ACETAMINOPHEN-CODEINE #3 300-30 MG PO TABS
1.0000 | ORAL_TABLET | Freq: Once | ORAL | Status: AC
  Administered 2013-09-03: 1 via ORAL
  Filled 2013-09-03: qty 1

## 2013-09-03 MED ORDER — ALBUTEROL SULFATE (2.5 MG/3ML) 0.083% IN NEBU
2.5000 mg | INHALATION_SOLUTION | RESPIRATORY_TRACT | Status: DC | PRN
  Administered 2013-09-03: 2.5 mg via RESPIRATORY_TRACT
  Filled 2013-09-03: qty 3

## 2013-09-03 MED ORDER — ASPIRIN EC 81 MG PO TBEC
81.0000 mg | DELAYED_RELEASE_TABLET | Freq: Every evening | ORAL | Status: DC
  Administered 2013-09-03 – 2013-09-06 (×4): 81 mg via ORAL
  Filled 2013-09-03 (×6): qty 1

## 2013-09-03 MED ORDER — SIMVASTATIN 20 MG PO TABS
20.0000 mg | ORAL_TABLET | Freq: Every day | ORAL | Status: DC
  Administered 2013-09-03 – 2013-09-04 (×2): 20 mg via ORAL
  Filled 2013-09-03 (×3): qty 1

## 2013-09-03 MED ORDER — SODIUM CHLORIDE 0.9 % IV BOLUS (SEPSIS)
2000.0000 mL | Freq: Once | INTRAVENOUS | Status: AC
  Administered 2013-09-03: 2000 mL via INTRAVENOUS

## 2013-09-03 MED ORDER — IPRATROPIUM BROMIDE 0.02 % IN SOLN: 0.5000 mg | Freq: Four times a day (QID) | RESPIRATORY_TRACT | Status: DC

## 2013-09-03 MED ORDER — FERROUS SULFATE 325 (65 FE) MG PO TABS
325.0000 mg | ORAL_TABLET | Freq: Every evening | ORAL | Status: DC
  Administered 2013-09-03 – 2013-09-06 (×4): 325 mg via ORAL
  Filled 2013-09-03 (×6): qty 1

## 2013-09-03 MED ORDER — BUDESONIDE-FORMOTEROL FUMARATE 160-4.5 MCG/ACT IN AERO
2.0000 | INHALATION_SPRAY | Freq: Two times a day (BID) | RESPIRATORY_TRACT | Status: DC
  Administered 2013-09-05 – 2013-09-06 (×2): 2 via RESPIRATORY_TRACT
  Filled 2013-09-03 (×2): qty 6

## 2013-09-03 MED ORDER — PROMETHAZINE HCL 25 MG PO TABS: 25.0000 mg | ORAL_TABLET | Freq: Four times a day (QID) | ORAL | Status: DC | PRN

## 2013-09-03 NOTE — Progress Notes (Signed)
eLink Physician-Brief Progress Note Patient Name: [name] DOB: 1940/09/16 MRN: [id number]  Date of Service  09/03/2013   HPI/Events of Note   Persistent hypotension and increasing lactate despite fluids.    eICU Interventions   Start phase II sepsis protocol. Fellow to see.   Intervention Category Major Interventions: Shock - evaluation and management  Donivan Thammavong R. 09/03/2013, 6:23 PM

## 2013-09-03 NOTE — Progress Notes (Signed)
ANTIBIOTIC/ANTICOAGULATION CONSULT NOTE - INITIAL  Pharmacy Consult for VANCOMYCIN, [place] AND COUMADIN Indication: rule out sepsis AND AFIB  No Known Allergies  Patient Measurements: Height: 5\' 7"  (170.2 cm) Weight: 227 lb 1.2 oz (103 kg) IBW/kg (Calculated) : 61.6  Vital Signs: Temp: 98.1 F (36.7 C) (02/15 1329) Temp src: Oral (02/15 1329) BP: 125/74 mmHg (02/15 1329) Pulse Rate: 107 (02/15 1329) Intake/Output from previous day:   Intake/Output from this shift: Total I/O In: 2100 [I.V.:2100] Out: -   Labs:  Recent Labs  09/03/13 0454  WBC 7.8  HGB 8.2*  PLT 160  CREATININE 1.47*   Estimated Creatinine Clearance: 43.3 ml/min (by C-G formula based on Cr of 1.47). No results found for this basename: VANCOTROUGH, Corlis Leak, VANCORANDOM, GENTTROUGH, GENTPEAK, GENTRANDOM, TOBRATROUGH, TOBRAPEAK, TOBRARND, AMIKACINPEAK, AMIKACINTROU, AMIKACIN,  in the last 72 hours   Microbiology: Recent Results (from the past 720 hour(s))  RESPIRATORY CULTURE OR RESPIRATORY AND SPUTUM CULTURE     Status: None   Collection Time    08/25/13  1:08 PM      Result Value Ref Range Status   Culture     Final   Value: Abundant METHICILLIN RESISTANT STAPHYLOCOCCUS AUREUS   Gram Stain Few   Final   Gram Stain WBC present-predominately PMN   Final   Gram Stain Few Squamous Epithelial Cells Present   Final   Gram Stain     Final   Value: Abundant GRAM POSITIVE COCCI IN PAIRS In Clusters In Chains   Organism ID, Bacteria METHICILLIN RESISTANT STAPHYLOCOCCUS AUREUS   Final   Comment: Rifampin and Gentamicin should not be used as     single drugs for treatment of Staph infections.     This organism DOES NOT demonstrate inducible     Clindamycin resistance in vitro.  RAPID STREP SCREEN     Status: None   Collection Time    09/03/13  6:31 AM      Result Value Ref Range Status   Streptococcus, Group A Screen (Direct) NEGATIVE  NEGATIVE Final   Comment: (NOTE)     A Rapid Antigen test may  result negative if the antigen level in the     sample is below the detection level of this test. The FDA has not     cleared this test as a stand-alone test therefore the rapid antigen     negative result has reflexed to a Group A Strep culture.    Medical History: Past Medical History  Diagnosis Date  . Coronary atherosclerosis of unspecified type of vessel, native or graft   . Atrial fibrillation 01/19/2012    stress test - non-gated secondary to A fib,  normal myocaridal perfusion study  . Unspecified venous (peripheral) insufficiency   . Other and unspecified hyperlipidemia   . Morbid obesity   . Esophageal reflux   . Irritable bowel syndrome   . Other symptoms involving urinary system(788.99)   . Chronic pain syndrome   . 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 Anxiety state, unspecified   . Herpes zoster without mention of complication   . Herpes zoster with other nervous system complications(053.19)   . Lumbago   . Anticoagulated on Coumadin, chronic for A. Fib 04/08/2012  . COPD (chronic obstructive pulmonary disease)   . 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 Pneumonia 4 years ago  . Bronchitis, chronic   . Fibromyalgia   . Arthritis   . CAD (coronary artery disease)  04/29/2006    echo -EF 45-50%, impaired LV relaxation  . 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 GI bleed 04/07/2012    endoscopy/colonoscopy unremarkable  . 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 DJD (degenerative joint disease)     Medications:  Prescriptions prior to admission  Medication Sig Dispense Refill  . albuterol (PROVENTIL) (2.5 MG/3ML) 0.083% nebulizer solution Take 2.5 mg by nebulization 4 (four) times daily as needed for wheezing or shortness of breath.      . ALPRAZolam (XANAX) 0.5 MG tablet Take 0.25-0.5 mg by mouth 4 (four) times daily as needed for anxiety.      Marland Kitchen amitriptyline (ELAVIL) 50 MG tablet Take  100 mg by mouth at bedtime.      Marland Kitchen aspirin EC 81 MG tablet Take 81 mg by mouth every evening.       . budesonide-formoterol (SYMBICORT) 160-4.5 MCG/ACT inhaler Inhale 2 puffs into the lungs 2 (two) times daily.  1 Inhaler  6  . Calcium Carbonate-Vitamin D (CALCIUM-VITAMIN D) 500-200 MG-UNIT per tablet Take 1 tablet by mouth 2 (two) times daily with a meal.      . chlorpheniramine-HYDROcodone (TUSSIONEX PENNKINETIC ER) 10-8 MG/5ML LQCR Take 5 mLs by mouth every 12 (twelve) hours as needed for cough.  120 mL  0  . docusate sodium (COLACE) 100 MG capsule Take 200 mg by mouth at bedtime.      . ferrous sulfate 325 (65 FE) MG tablet Take 325 mg by mouth every evening.      . furosemide (LASIX) 40 MG tablet Take 40 mg by mouth 2 (two) times daily.       Marland Kitchen gabapentin (NEURONTIN) 600 MG tablet Take 600 mg by mouth 4 (four) times daily.      Marland Kitchen guaiFENesin (MUCINEX) 600 MG 12 hr tablet Take 1,200 mg by mouth 2 (two) times daily.      . isosorbide mononitrate (IMDUR) 30 MG 24 hr tablet Take 1 tablet (30 mg total) by mouth at bedtime.  30 tablet  11  . metoprolol (LOPRESSOR) 50 MG tablet Take 0.5 tablets (25 mg total) by mouth 2 (two) times daily.  90 tablet  2  . Multiple Vitamin (MULTIVITAMIN WITH MINERALS) TABS Take 1 tablet by mouth daily.      . nitroGLYCERIN (NITROSTAT) 0.4 MG SL tablet Place 0.4 mg under the tongue every 5 (five) minutes as needed for chest pain.      Marland Kitchen omeprazole (PRILOSEC) 20 MG capsule Take 20 mg by mouth 2 (two) times daily.      Marland Kitchen oxyCODONE-acetaminophen (PERCOCET/ROXICET) 5-325 MG per tablet Take 2 tablets by mouth every 4 (four) hours as needed for pain.      . potassium chloride SA (K-DUR,KLOR-CON) 20 MEQ tablet Take 20 mEq by mouth 2 (two) times daily.      . promethazine (PHENERGAN) 25 MG tablet Take 25 mg by mouth every 6 (six) hours as needed for nausea.      . Simethicone (GAS-X PO) Take 1 tablet by mouth daily as needed (for gas).      . simvastatin (ZOCOR) 20 MG tablet  Take 20 mg by mouth at bedtime.      Marland Kitchen warfarin (COUMADIN) 5 MG tablet Take 2.5-5 mg by mouth See admin instructions. Take 0.5 tablet Monday, Wednesday and Friday Take 1 tablet Tuesday, Thursday, Saturday and Sunday       Assessment: 73 yo F with h/o Afib,  CKDIII, COPD, HFpEF presenting on 2/15 with SOB, productive cough, fevers and chills since yesterday. Pharmacy consulted to start vanc + zosyn for r/o sepsis and restart warfarin for Afib.   ABX: In ED, BP was 84/66, HR 133, Tmax 102.8 F and oxygen saturation of 96% on 2 L [place] oxygen support. WBC currently wnl. SCr 1.47 (CrCl 42 ml/min), which seems to be baseline.   COUMADIN: Per patient, home dose is 5 mg daily except 2.5 mg on M,W,F. Last dose taken on 2/14. Current INR slightly SUBtherapeutic at 1.90. H/H low, plt wnl, no reported s/s bleeding. Will give slightly increased dose of coumadin tonight for low INR.   Goal of Therapy:  Vancomycin trough level 15-20 mcg/ml INR: 2-3  Plan:  - Start vancomycin 2000 mg IV x 1, then continue with 1500 mg IV q24h  - Start Zosyn at 3.375 gm IV q8h (infuse over 4 hrs) - Monitor renal function, C&S, temp, WBC, levels prn - Give coumadin 6 mg PO x 1 tonight - Daily PT/INR, CBC, s/s bleeding  Harolyn Rutherford, PharmD Clinical Pharmacist - Resident Pager: [phone] Pharmacy: [phone] 09/03/2013 1:41 PM

## 2013-09-03 NOTE — ED Notes (Signed)
Pt arrives via EMS from home. Pts husband called stating that pt had altered mental status. Upon EMS arrival pt sat were 78%. Placed on 4L and came up to 92%. EMS reports increased mental status during ride. Pt states she had a CT of her chest a week ago and showed chronic bronchitis and was placed on a couple antibiotics and prednisone. CBG 154 132/60 HR 94 RR 20. Upon arrival to ED pt on RA was 77%. On 3L pt is 95%. Pt states she does not use O2 at home.

## 2013-09-03 NOTE — H&P (Addendum)
[name] History and Physical  [name] [id number] DOB: 07-13-1941 DOA: 09/03/2013  Referring physician: ER physician PCP: Dana Space, MD   Chief Complaint: shortness of breath, fever and cough  HPI:  73 year old female with past medical history of atrial fibrillation on coumadin, CKD stage III, HTN, COPD, chronic diastolic CHF who presented to [facility] ED 09/03/2013 with complaints of worsening shortness of breath, associated cough productive of thick green sputum, fevers and chills. Pt reported her symptoms started suddenly, about 24 hours prior to this admission. She did not have chest pain unless she had a cough. No reports of hemoptysis. No abdominal pain, no diarrhea or constipation. No lightheadedness or falls. In ED, BP was 84/66, HR 133, Tmax 102.8 F and oxygen saturation of 96% on 2 L [place] oxygen support. Blood work revealed hemoglobin of 8.2, potassium of 3.6 and creatinine of 1.47. Her INR was 1.90. CXR did not show acute cardiopulmonary findings. CT chest with contrast revealed likely COPD, mediastinal and bilateral hilar lymphadenopathy, possible underlying sarcoidosis; there was also a new nonspecific 5 mm nodule in the anterior aspect of the right upper lobe which requires follow up in 6-12 months to re-evaluate if this is malignant lesion such as bronchogenic carcinoma.  She was given nebulizer treatments in ED, steroids but continued to experience shortness of breath. She was started on broad spectrum antibiotics, vanco and zosyn for possible sepsis.  Assessment and Plan:  Principal Problem:   Acute respiratory failure with hypoxia - likely combination of pneumonia, COPD, CHF - patient's respiratory status is better now and she is saturating 99% on 2 L  oxygen support - for pneumonia and possible related sepsis she is on vanco and cefepime (in ED, given Levaquin); order placed for blood cultures, resp culture, influenza, legionella and strep pneumoniae - for  COPD, she is on nebulizers scheduled and as needed; solumedrol 60 mg every 6 hours IV and oxygen support to keep O2 saturation above 90% - for CHF, we held on IV fluids until we see the result of BNP; she does have history of diastolic heart failure; will alos check 2 D ECHO on this admission  Active Problems:   Sepsis - likely related to pulmonary process, pneumonia - pt had BP of 84/66, HR 133, RR 29, Tmax 102.8 F and clinical signs of possible pneumonia. This satisfies criteria of SIRS and possible sepsis. - check procalcitonin level - continue vanco and cefepime - follow up blood culture results and strep pneumonia results - influenza and legionella are negative   COPD (chronic obstructive pulmonary disease) - management as above with BD, steroids, oxygen support   Anemia, chronic disease - secondary to history of CKD - hemoglobin stable - no indications for transfusion   CKD (chronic kidney disease), stage III - creatinine 1.3 in 05/2013 and on this admission 1.4 - she is on lasix for CHF but this is on hold due to low BP - follow up BMP in am   Atrial fibrillation - on coumadin, order placed for pharmacy to dose - rate controlled with metoprolol which is on hold due to soft BP   Hypokalemia - likely due to lasix - repleted   Hypertension - due to borderline low BP hold lasix, metoprolol and imdur until BP improves    Pulmonary nodule - seen on CT chest, needs follow up CT In 6 - 12 months for re-eval  Radiological Exams on Admission: Dg Chest Port 1 View 09/03/2013 IMPRESSION: No acute cardiopulmonary  abnormality.   Electronically Signed   By: [name] M.D.   On: 09/03/2013 05:54   Code Status: Full Family Communication: Pt at bedside Disposition Plan: Admit for further evaluation  Leisa Lenz, MD  [facility] Pager [phone]  Review of Systems:  Constitutional: positive for fever, chills and malaise/fatigue. Negative for diaphoresis.  HENT: Negative for  hearing loss, ear pain, nosebleeds, congestion, sore throat, neck pain, tinnitus and ear discharge.  Eyes: Negative for blurred vision, double vision, photophobia, pain, discharge and redness.  Respiratory: positive for cough, sputum production, shortness of breath, no wheezing and stridor.   Cardiovascular: Negative for chest pain, palpitations, orthopnea, claudication and leg swelling.  Gastrointestinal: Negative for nausea, vomiting and abdominal pain. Negative for heartburn, constipation, blood in stool and melena.  Genitourinary: Negative for dysuria, urgency, frequency, hematuria and flank pain.  Musculoskeletal: Negative for myalgias, back pain, joint pain and falls.  Skin: Negative for itching and rash.  Neurological: Negative for dizziness and weakness. Negative for tingling, tremors, sensory change, speech change, focal weakness, loss of consciousness and headaches.  Endo/Heme/Allergies: Negative for environmental allergies and polydipsia. Does not bruise/bleed easily.  Psychiatric/Behavioral: Negative for suicidal ideas. The patient is not nervous/anxious.      Past Medical History  Diagnosis Date  . Coronary atherosclerosis of unspecified type of vessel, native or graft   . Atrial fibrillation 01/19/2012    stress test - non-gated secondary to A fib,  normal myocaridal perfusion study  . Unspecified venous (peripheral) insufficiency   . Other and unspecified hyperlipidemia   . Morbid obesity   . Esophageal reflux   . Irritable bowel syndrome   . Other symptoms involving urinary system(788.99)   . Chronic pain syndrome   . 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 Anxiety state, unspecified   . Herpes zoster without mention of complication   . Herpes zoster with other nervous system complications(053.19)   . Lumbago   . Anticoagulated on Coumadin, chronic for A. Fib 04/08/2012  . COPD (chronic obstructive pulmonary disease)   . Unspecified disorder resulting from  impaired renal function   . Pneumonia 4 years ago  . Bronchitis, chronic   . Fibromyalgia   . Arthritis   . CAD (coronary artery disease) 04/29/2006    echo -EF 45-50%, impaired LV relaxation  . 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 GI bleed 04/07/2012    endoscopy/colonoscopy unremarkable  . 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 DJD (degenerative joint disease)    Past Surgical History  Procedure Laterality Date  . Vesicovaginal fistula closure w/ tah    . Cardiac catheterization  01/06/2010    RCA mid artery stenosis at 99%, LAD proximal occlusion 90%  at origin of ramus  . Nissen fundoplication    . Esophagogastroduodenoscopy  04/09/2012    Procedure: ESOPHAGOGASTRODUODENOSCOPY (EGD);  Surgeon: Beryle Beams, MD;  Location: [facility] ENDOSCOPY;  Service: Endoscopy;  Laterality: N/A;  tentatively scheduled per Trula Slade due to patients breathing problems  . Colonoscopy  04/11/2012    Procedure: COLONOSCOPY;  Surgeon: Ladene Artist, MD,FACG;  Location: [facility] ENDOSCOPY;  Service: Endoscopy;  Laterality: N/A;  . Abdominal hysterectomy  at 73 years old  . Appendectomy  about 50 years ago  . Coronary artery bypass graft  1997    LIMA to LAD, SVG to ramus, SVG to RCA  . Cholecystectomy  50 years ago  . Neck surgery  73 years old    full movement  . Right knee arthroscopy  3 years ago  . Eye surgery  2005    cataracts both eyes  . Back surgery      x5, "birdcage" and fused in lower back  . Total knee arthroplasty  06/28/2012    Procedure: TOTAL KNEE ARTHROPLASTY;  Surgeon: Sydnee Cabal, MD;  Location: [facility] ORS;  Service: Orthopedics;  Laterality: Left;  . Joint replacement Left 06/28/12    knee replacement   Social History:  reports that she quit smoking about 29 years ago. Her smoking use included Cigarettes. She has a 22 pack-year smoking history. She has never used smokeless tobacco. She reports that she does not drink alcohol or use illicit  drugs.  No Known Allergies  Family History  Problem Relation Age of Onset  . Heart disease Mother   . Lung disease Father   . Kidney disease       Prior to Admission medications   Medication Sig Start Date End Date Taking? Authorizing Provider  albuterol (PROVENTIL) (2.5 MG/3ML) 0.083% nebulizer solution Take 2.5 mg by nebulization 4 (four) times daily as needed for wheezing or shortness of breath.   Yes Historical Provider, MD  ALPRAZolam Duanne Moron) 0.5 MG tablet Take 0.25-0.5 mg by mouth 4 (four) times daily as needed for anxiety.   Yes Historical Provider, MD  amitriptyline (ELAVIL) 50 MG tablet Take 100 mg by mouth at bedtime. 05/24/13  Yes Dana Space, MD  aspirin EC 81 MG tablet Take 81 mg by mouth every evening.    Yes Historical Provider, MD  budesonide-formoterol (SYMBICORT) 160-4.5 MCG/ACT inhaler Inhale 2 puffs into the lungs 2 (two) times daily. 08/30/13  Yes Dana Space, MD  Calcium Carbonate-Vitamin D (CALCIUM-VITAMIN D) 500-200 MG-UNIT per tablet Take 1 tablet by mouth 2 (two) times daily with a meal.   Yes Historical Provider, MD  chlorpheniramine-HYDROcodone (TUSSIONEX PENNKINETIC ER) 10-8 MG/5ML LQCR Take 5 mLs by mouth every 12 (twelve) hours as needed for cough. 07/07/13  Yes Dana Space, MD  docusate sodium (COLACE) 100 MG capsule Take 200 mg by mouth at bedtime.   Yes Historical Provider, MD  ferrous sulfate 325 (65 FE) MG tablet Take 325 mg by mouth every evening.   Yes Historical Provider, MD  furosemide (LASIX) 40 MG tablet Take 40 mg by mouth 2 (two) times daily.    Yes Historical Provider, MD  gabapentin (NEURONTIN) 600 MG tablet Take 600 mg by mouth 4 (four) times daily.   Yes Historical Provider, MD  guaiFENesin (MUCINEX) 600 MG 12 hr tablet Take 1,200 mg by mouth 2 (two) times daily.   Yes Historical Provider, MD  isosorbide mononitrate (IMDUR) 30 MG 24 hr tablet Take 1 tablet (30 mg total) by mouth at bedtime. 01/16/13  Yes Lorretta Harp, MD  metoprolol  (LOPRESSOR) 50 MG tablet Take 0.5 tablets (25 mg total) by mouth 2 (two) times daily. 06/10/13  Yes Lorretta Harp, MD  Multiple Vitamin (MULTIVITAMIN WITH MINERALS) TABS Take 1 tablet by mouth daily.   Yes Historical Provider, MD  nitroGLYCERIN (NITROSTAT) 0.4 MG SL tablet Place 0.4 mg under the tongue every 5 (five) minutes as needed for chest pain.   Yes Historical Provider, MD  omeprazole (PRILOSEC) 20 MG capsule Take 20 mg by mouth 2 (two) times daily.   Yes Historical Provider, MD  oxyCODONE-acetaminophen (PERCOCET/ROXICET) 5-325 MG per tablet Take 2 tablets by mouth every 4 (four) hours as needed  for pain.   Yes Historical Provider, MD  potassium chloride SA (K-DUR,KLOR-CON) 20 MEQ tablet Take 20 mEq by mouth 2 (two) times daily.   Yes Historical Provider, MD  promethazine (PHENERGAN) 25 MG tablet Take 25 mg by mouth every 6 (six) hours as needed for nausea.   Yes Historical Provider, MD  Simethicone (GAS-X PO) Take 1 tablet by mouth daily as needed (for gas).   Yes Historical Provider, MD  simvastatin (ZOCOR) 20 MG tablet Take 20 mg by mouth at bedtime.   Yes Historical Provider, MD  warfarin (COUMADIN) 5 MG tablet Take 2.5-5 mg by mouth See admin instructions. Take 0.5 tablet Monday, Wednesday and Friday Take 1 tablet Tuesday, Thursday, Saturday and Sunday   Yes Historical Provider, MD   Physical Exam: Filed Vitals:   09/03/13 0950 09/03/13 1012 09/03/13 1020 09/03/13 1104  BP: 96/32 110/58 112/82 94/69  Pulse: 122 122 115 116  Temp:      TempSrc:      Resp: 20 20 19 29   Height:      Weight:      SpO2: 96% 96% 97% 96%    Physical Exam  Constitutional: Appears in mild distress due to shortness of breath  HENT: Normocephalic. External right and left ear normal.  Eyes: Conjunctivae and EOM are normal. PERRLA, no scleral icterus.  Neck: Normal ROM. Neck supple. No JVD. No tracheal deviation. No thyromegaly.  CVS: tachycardic , S1/S2 appreciated  Pulmonary: upper lung lobes  rhonchi and coarse breath sounds  Abdominal: Soft. BS +,  no distension, tenderness, rebound or guarding.  Musculoskeletal: Normal range of motion. No edema and no tenderness.  Lymphadenopathy: No lymphadenopathy noted, cervical, inguinal. Neuro: Alert. Normal reflexes, muscle tone coordination. No cranial nerve deficit. Skin: Skin is warm and dry.  Psychiatric: Normal mood and affect. Behavior, judgment, thought content normal.   Labs on Admission:  Basic Metabolic Panel:  Recent Labs Lab 09/03/13 0454  NA 138  K 3.6*  CL 97  CO2 27  GLUCOSE 98  BUN 18  CREATININE 1.47*  CALCIUM 8.9   Liver Function Tests:  Recent Labs Lab 09/03/13 0454  AST 23  ALT 12  ALKPHOS 108  BILITOT 1.3*  PROT 7.0  ALBUMIN 3.3*   No results found for this basename: LIPASE, AMYLASE,  in the last 168 hours No results found for this basename: AMMONIA,  in the last 168 hours CBC:  Recent Labs Lab 09/03/13 0454  WBC 7.8  NEUTROABS 5.3  HGB 8.2*  HCT 26.7*  MCV 108.5*  PLT 160   Cardiac Enzymes:  Recent Labs Lab 09/03/13 0454  TROPONINI <0.30   BNP: No components found with this basename: POCBNP,  CBG: No results found for this basename: GLUCAP,  in the last 168 hours  If 7PM-7AM, please contact night-coverage [email or web address] Password [email or web address] 09/03/2013, 11:06 AM

## 2013-09-03 NOTE — Progress Notes (Signed)
Dr aware of b/p = 91/39 (53) .

## 2013-09-03 NOTE — Progress Notes (Signed)
eLink Physician-Brief Progress Note Patient Name: [name] DOB: Dec 05, 1940 MRN: [id number]  Date of Service  09/03/2013   HPI/Events of Note   Mild hypotension. By chart review [facility] concerned about sepsis. < 4 L fluid given thus far.  eICU Interventions   RN to recheck blood pressure and if really low will bolus fluid and recheck lactate.    Intervention Category Major Interventions: Hypotension - evaluation and management  Nigel Wessman R. 09/03/2013, 3:36 PM

## 2013-09-03 NOTE — Progress Notes (Signed)
E-Link Dr aware coumadin dose was moved to 2100 in the case c-line needs to be placed. Pt resting in 2H 13 w/spouse at bedside . VSS more stable . C-line placement on hold .

## 2013-09-03 NOTE — Progress Notes (Signed)
eLink Physician-Brief Progress Note Patient Name: [name] DOB: 1941-07-10 MRN: [id number]  Date of Service  09/03/2013   HPI/Events of Note   Mild AMS - RN thinks 2/2 percocet.   eICU Interventions   Hold percocet. RN to monitor MS closely.    Intervention Category Major Interventions: Change in mental status - evaluation and management  [name] R. 09/03/2013, 5:23 PM

## 2013-09-03 NOTE — Progress Notes (Signed)
Pt  Placed on orange contact isolation for positive MRSA(nasal swab) . Also,Droplet isolation  continued .

## 2013-09-04 DIAGNOSIS — E860 Dehydration: Secondary | ICD-10-CM | POA: Diagnosis present

## 2013-09-04 DIAGNOSIS — J189 Pneumonia, unspecified organism: Secondary | ICD-10-CM | POA: Diagnosis present

## 2013-09-04 LAB — CBC
HCT: 25.7 % — ABNORMAL LOW (ref 36.0–46.0)
HEMOGLOBIN: 7.7 g/dL — AB (ref 12.0–15.0)
MCH: 32.8 pg (ref 26.0–34.0)
MCHC: 30 g/dL (ref 30.0–36.0)
MCV: 109.4 fL — ABNORMAL HIGH (ref 78.0–100.0)
Platelets: 92 10*3/uL — ABNORMAL LOW (ref 150–400)
RBC: 2.35 MIL/uL — AB (ref 3.87–5.11)
RDW: 21.8 % — AB (ref 11.5–15.5)
WBC: 7.3 10*3/uL (ref 4.0–10.5)

## 2013-09-04 LAB — LEGIONELLA ANTIGEN, URINE: LEGIONELLA ANTIGEN, URINE: NEGATIVE

## 2013-09-04 LAB — COMPREHENSIVE METABOLIC PANEL
ALT: 14 U/L (ref 0–35)
AST: 25 U/L (ref 0–37)
Albumin: 3.1 g/dL — ABNORMAL LOW (ref 3.5–5.2)
Alkaline Phosphatase: 85 U/L (ref 39–117)
BUN: 25 mg/dL — AB (ref 6–23)
CALCIUM: 8.6 mg/dL (ref 8.4–10.5)
CO2: 23 mEq/L (ref 19–32)
CREATININE: 1.32 mg/dL — AB (ref 0.50–1.10)
Chloride: 101 mEq/L (ref 96–112)
GFR calc non Af Amer: 40 mL/min — ABNORMAL LOW (ref >90)
GFR, EST AFRICAN AMERICAN: 46 mL/min — AB (ref >90)
GLUCOSE: 182 mg/dL — AB (ref 70–99)
Potassium: 4.7 mEq/L (ref 3.7–5.3)
Sodium: 137 mEq/L (ref 137–147)
TOTAL PROTEIN: 6.9 g/dL (ref 6.0–8.3)
Total Bilirubin: 0.9 mg/dL (ref 0.3–1.2)

## 2013-09-04 LAB — URINE CULTURE
COLONY COUNT: NO GROWTH
COLONY COUNT: NO GROWTH
CULTURE: NO GROWTH
Culture: NO GROWTH

## 2013-09-04 LAB — PROTIME-INR
INR: 2.17 — ABNORMAL HIGH (ref 0.00–1.49)
Prothrombin Time: 23.5 seconds — ABNORMAL HIGH (ref 11.6–15.2)

## 2013-09-04 LAB — TSH: TSH: 0.88 u[IU]/mL (ref 0.350–4.500)

## 2013-09-04 MED ORDER — OXYCODONE-ACETAMINOPHEN 5-325 MG PO TABS
2.0000 | ORAL_TABLET | ORAL | Status: DC | PRN
  Administered 2013-09-04 – 2013-09-06 (×6): 2 via ORAL
  Filled 2013-09-04 (×6): qty 2

## 2013-09-04 MED ORDER — LEVALBUTEROL HCL 0.63 MG/3ML IN NEBU
0.6300 mg | INHALATION_SOLUTION | RESPIRATORY_TRACT | Status: DC | PRN
  Administered 2013-09-05: 0.63 mg via RESPIRATORY_TRACT
  Filled 2013-09-04 (×2): qty 3

## 2013-09-04 MED ORDER — WARFARIN SODIUM 4 MG PO TABS
4.0000 mg | ORAL_TABLET | Freq: Once | ORAL | Status: AC
  Administered 2013-09-04: 4 mg via ORAL
  Filled 2013-09-04: qty 1

## 2013-09-04 MED ORDER — LEVALBUTEROL HCL 0.63 MG/3ML IN NEBU: 0.6300 mg | INHALATION_SOLUTION | Freq: Four times a day (QID) | RESPIRATORY_TRACT | Status: DC | PRN

## 2013-09-04 MED ORDER — OSELTAMIVIR PHOSPHATE 30 MG PO CAPS
30.0000 mg | ORAL_CAPSULE | Freq: Two times a day (BID) | ORAL | Status: DC
  Filled 2013-09-04: qty 1

## 2013-09-04 MED ORDER — GABAPENTIN 300 MG PO CAPS
600.0000 mg | ORAL_CAPSULE | Freq: Four times a day (QID) | ORAL | Status: DC
  Administered 2013-09-04 (×3): 600 mg via ORAL
  Filled 2013-09-04 (×7): qty 2

## 2013-09-04 MED ORDER — DILTIAZEM HCL 100 MG IV SOLR
5.0000 mg/h | INTRAVENOUS | Status: DC
  Administered 2013-09-04: 5 mg/h via INTRAVENOUS
  Filled 2013-09-04: qty 100

## 2013-09-04 MED ORDER — METHYLPREDNISOLONE SODIUM SUCC 125 MG IJ SOLR
60.0000 mg | Freq: Two times a day (BID) | INTRAMUSCULAR | Status: DC
  Administered 2013-09-04: 60 mg via INTRAVENOUS
  Filled 2013-09-04 (×2): qty 0.96

## 2013-09-04 MED ORDER — METOPROLOL TARTRATE 1 MG/ML IV SOLN
5.0000 mg | INTRAVENOUS | Status: DC | PRN
  Administered 2013-09-04: 5 mg via INTRAVENOUS
  Filled 2013-09-04: qty 5

## 2013-09-04 MED ORDER — SODIUM CHLORIDE 0.9 % IV BOLUS (SEPSIS)
500.0000 mL | Freq: Once | INTRAVENOUS | Status: AC
  Administered 2013-09-04: 500 mL via INTRAVENOUS

## 2013-09-04 MED ORDER — METOPROLOL TARTRATE 1 MG/ML IV SOLN
5.0000 mg | Freq: Once | INTRAVENOUS | Status: AC
  Administered 2013-09-04: 5 mg via INTRAVENOUS
  Filled 2013-09-04: qty 5

## 2013-09-04 NOTE — Progress Notes (Signed)
Clinical Social Work Department BRIEF PSYCHOSOCIAL ASSESSMENT 09/04/2013  Patient:  [name], [name]     Account Number:  [id number]     Admit date:  09/03/2013  Clinical Social Worker:  Freeman Caldron  Date/Time:  09/04/2013 01:36 PM  Referred by:  Physician  Date Referred:  09/04/2013 Referred for  Other - See comment   Other Referral:   COPD Gold protocol   Interview type:  Patient Other interview type:    PSYCHOSOCIAL DATA Living Status:  FAMILY Admitted from facility:   Level of care:   Primary support name:  Tesla Keeler ([phone]) Primary support relationship to patient:  SPOUSE Degree of support available:   Good--pt has three adult sons who live closeby and provide support, and pt describes her husband as her primary support.    CURRENT CONCERNS Current Concerns  Other - See comment   Other Concerns:   COPD Gold.    SOCIAL WORK ASSESSMENT / PLAN CSW and pt spoke extensively about her experience with COPD, which she says she has had for around 10 years. Pt states she has been having more difficulty breathing, and CSW asked about her experience with anxiety when she has difficulty breathing. Pt states she takes an anxiety medication, which helps with this significantly. Pt states she sometimes feels guilty that she cannot help her husband around the home and with cooking, but that he never makes her feel bad and she appreciates all the support he gives her. Pt states she has not been feeling depressed and that instead she feels "angry" and "frustrated" that COPD is limiting her life so significantly. CSW provided support and engaged pt utilizing motivational interviewing techniques.   Assessment/plan status:  No Further Intervention Required Other assessment/ plan:   Information/referral to community resources:   COPD Gold Protocol depression scale completed and placed where pt's chart should be on unit (chart missing and not in cubby or pt's room when  CSW went to put scale on chart)    PATIENT'S/FAMILY'S RESPONSE TO PLAN OF CARE: Good--pt friendly and explained her husband of 70 years is a significant support to her and her three adult sons provide support. Pt explains she feels she "has a lot to live for," and does not feel depressed by COPD. Further, she feels her anxiety is well managed by her medications and she gets frustrated sometimes by the limitations placed on her by breathing difficulty. CSW provided support.       Ky Barban, MSW, [facility] Clinical Social Worker [phone]

## 2013-09-04 NOTE — Progress Notes (Signed)
Name: [name] MRN: [id number] DOB: 1941/02/28  ELECTRONIC ICU PHYSICIAN NOTE  Problem:  Rapid afib   Intake/Output Summary (Last 24 hours) at 09/04/13 1806 Last data filed at 09/04/13 1800  Gross per 24 hour  Intake 2583.5 ml  Output    350 ml  Net 2233.5 ml      Intervention:  cardizem drip/ dc albuterol  Christinia Gully 09/04/2013, 6:06 PM

## 2013-09-04 NOTE — Consult Note (Addendum)
Name: Patt Ancar MRN: [id number] DOB: 1941-04-24    ADMISSION DATE:  09/03/2013 CONSULTATION DATE:  09/03/13  REFERRING MD :  Manson Passey, MD PRIMARY SERVICE: PCCM  CHIEF COMPLAINT:  SOB, hypotension.  BRIEF PATIENT DESCRIPTION:  73 years old female with PMH relevant for COPD, CAD, A. Fib on coumadin, diastolic CHF, CKD. Presents with worsening SOB, cough, wheezing, fever and hypotension.   SIGNIFICANT EVENTS / STUDIES:  - CTA: IMPRESSION:  1. No definite acute findings to account for the patient's symptoms.  2. There is mild diffuse bronchial wall thickening with mild  centrilobular and paraseptal emphysema, which is suggestive of  underlying COPD.  3. In addition, there is mediastinal and bilateral hilar  lymphadenopathy. This is nonspecific, but suggestive of a systemic  disease. Given the very mild nodular thickening of portions of the  interlobar fissures in the lungs, this may be related to underlying  sarcoidosis. However, clinical correlation for signs and symptoms of  lymphoproliferative disorder is also recommended.  4. In addition, there is a new nonspecific 5 mm nodule in the  anterior aspect of the right upper lobe (image 17 of series 3).  Given the smoking related changes in the lungs, the patient is at  high risk for bronchogenic carcinoma, and a follow-up chest CT at  6-12 months is recommended. This recommendation follows the  consensus statement: Guidelines for Management of Small Pulmonary  Nodules Detected on CT Scans: A Statement from the Fleischner  Society as published in Radiology 2005;237:395-400.  5. Atherosclerosis, including left main and 3 vessel coronary artery  disease. Status post median sternotomy for CABG including LIMA to  the LAD.  6. Additional incidental findings, as above.   LINES / TUBES: - Peripheral IV's  CULTURES: - Blood cultures sent - Urine culture sent - Will get sputum culture  ANTIBIOTICS: - Cefepime  -  Vancomycin  HISTORY OF PRESENT ILLNESS:   73 years old female with PMH relevant for COPD, CAD, A. Fib on coumadin, diastolic CHF, CKD. Quit smoking 30 years ago. Presents with worsening SOB, cough, increased sputum production, wheezing, fever and hypotension. Initially admitted by [facility] but transferred to the ICU for hypotension and possible sepsis. CTA negative for PE, no infiltrates and no evidence of CHF. Does have a a 5 mm RUL nodule and mediastinal adenopathy that will need follow up once she is improved. At the time of my examination the patient is awake, alert, oriented x 3 and already feeling better. Her BP is up with MAP of 65 after IV fluid resuscitation. Saturating 98% on 2 L [place]. Blood work showed mildly elevated lactic acid and a BNP of 701[address]. No elevated WBC.  PAST MEDICAL HISTORY :  Past Medical History  Diagnosis Date  . Coronary atherosclerosis of unspecified type of vessel, native or graft   . Atrial fibrillation 01/19/2012    stress test - non-gated secondary to A fib,  normal myocaridal perfusion study  . Unspecified venous (peripheral) insufficiency   . Other and unspecified hyperlipidemia   . Morbid obesity   . Esophageal reflux   . Irritable bowel syndrome   . Other symptoms involving urinary system(788.99)   . Chronic pain syndrome   . 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 Anxiety state, unspecified   . Herpes zoster without mention of complication   . Herpes zoster with other nervous system complications(053.19)   . Lumbago   . Anticoagulated on Coumadin, chronic for A. Fib  04/08/2012  . COPD (chronic obstructive pulmonary disease)   . 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 Pneumonia 4 years ago  . Bronchitis, chronic   . Fibromyalgia   . Arthritis   . CAD (coronary artery disease) 04/29/2006    echo -EF 45-50%, impaired LV relaxation  . Conduction disorder, unspecified 1/5/209    14 day monitor - daily AF  burden 80-100%  . GI bleed 04/07/2012    endoscopy/colonoscopy unremarkable  . 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 DJD (degenerative joint disease)   . Myocardial infarction   . Anemia    Past Surgical History  Procedure Laterality Date  . Vesicovaginal fistula closure w/ tah    . Cardiac catheterization  01/06/2010    RCA mid artery stenosis at 99%, LAD proximal occlusion 90%  at origin of ramus  . Nissen fundoplication    . Esophagogastroduodenoscopy  04/09/2012    Procedure: ESOPHAGOGASTRODUODENOSCOPY (EGD);  Surgeon: Beryle Beams, MD;  Location: [facility] ENDOSCOPY;  Service: Endoscopy;  Laterality: N/A;  tentatively scheduled per Trula Slade due to patients breathing problems  . Colonoscopy  04/11/2012    Procedure: COLONOSCOPY;  Surgeon: Ladene Artist, MD,FACG;  Location: [facility] ENDOSCOPY;  Service: Endoscopy;  Laterality: N/A;  . Abdominal hysterectomy  at 73 years old  . Appendectomy  about 50 years ago  . Coronary artery bypass graft  1997    LIMA to LAD, SVG to ramus, SVG to RCA  . Cholecystectomy  50 years ago  . Neck surgery  73 years old    full movement  . Right knee arthroscopy  3 years ago  . Eye surgery  2005    cataracts both eyes  . Back surgery      x5, "birdcage" and fused in lower back  . Total knee arthroplasty  06/28/2012    Procedure: TOTAL KNEE ARTHROPLASTY;  Surgeon: Sydnee Cabal, MD;  Location: [facility] ORS;  Service: Orthopedics;  Laterality: Left;  . Joint replacement Left 06/28/12    knee replacement   Prior to Admission medications   Medication Sig Start Date End Date Taking? Authorizing Provider  albuterol (PROVENTIL) (2.5 MG/3ML) 0.083% nebulizer solution Take 2.5 mg by nebulization 4 (four) times daily as needed for wheezing or shortness of breath.   Yes Historical Provider, MD  ALPRAZolam Duanne Moron) 0.5 MG tablet Take 0.25-0.5 mg by mouth 4 (four) times daily as needed for anxiety.   Yes Historical Provider, MD  amitriptyline (ELAVIL) 50 MG tablet Take 100 mg by  mouth at bedtime. 05/24/13  Yes Noralee Space, MD  aspirin EC 81 MG tablet Take 81 mg by mouth every evening.    Yes Historical Provider, MD  budesonide-formoterol (SYMBICORT) 160-4.5 MCG/ACT inhaler Inhale 2 puffs into the lungs 2 (two) times daily. 08/30/13  Yes Noralee Space, MD  Calcium Carbonate-Vitamin D (CALCIUM-VITAMIN D) 500-200 MG-UNIT per tablet Take 1 tablet by mouth 2 (two) times daily with a meal.   Yes Historical Provider, MD  chlorpheniramine-HYDROcodone (TUSSIONEX PENNKINETIC ER) 10-8 MG/5ML LQCR Take 5 mLs by mouth every 12 (twelve) hours as needed for cough. 07/07/13  Yes Noralee Space, MD  docusate sodium (COLACE) 100 MG capsule Take 200 mg by mouth at bedtime.   Yes Historical Provider, MD  ferrous sulfate 325 (65 FE) MG tablet Take 325 mg by mouth every evening.   Yes Historical Provider, MD  furosemide (LASIX) 40 MG tablet Take 40 mg by mouth 2 (two) times daily.  Yes Historical Provider, MD  gabapentin (NEURONTIN) 600 MG tablet Take 600 mg by mouth 4 (four) times daily.   Yes Historical Provider, MD  guaiFENesin (MUCINEX) 600 MG 12 hr tablet Take 1,200 mg by mouth 2 (two) times daily.   Yes Historical Provider, MD  isosorbide mononitrate (IMDUR) 30 MG 24 hr tablet Take 1 tablet (30 mg total) by mouth at bedtime. 01/16/13  Yes Lorretta Harp, MD  metoprolol (LOPRESSOR) 50 MG tablet Take 0.5 tablets (25 mg total) by mouth 2 (two) times daily. 06/10/13  Yes Lorretta Harp, MD  Multiple Vitamin (MULTIVITAMIN WITH MINERALS) TABS Take 1 tablet by mouth daily.   Yes Historical Provider, MD  nitroGLYCERIN (NITROSTAT) 0.4 MG SL tablet Place 0.4 mg under the tongue every 5 (five) minutes as needed for chest pain.   Yes Historical Provider, MD  omeprazole (PRILOSEC) 20 MG capsule Take 20 mg by mouth 2 (two) times daily.   Yes Historical Provider, MD  oxyCODONE-acetaminophen (PERCOCET/ROXICET) 5-325 MG per tablet Take 2 tablets by mouth every 4 (four) hours as needed for pain.   Yes  Historical Provider, MD  potassium chloride SA (K-DUR,KLOR-CON) 20 MEQ tablet Take 20 mEq by mouth 2 (two) times daily.   Yes Historical Provider, MD  promethazine (PHENERGAN) 25 MG tablet Take 25 mg by mouth every 6 (six) hours as needed for nausea.   Yes Historical Provider, MD  Simethicone (GAS-X PO) Take 1 tablet by mouth daily as needed (for gas).   Yes Historical Provider, MD  simvastatin (ZOCOR) 20 MG tablet Take 20 mg by mouth at bedtime.   Yes Historical Provider, MD  warfarin (COUMADIN) 5 MG tablet Take 2.5-5 mg by mouth See admin instructions. Take 0.5 tablet Monday, Wednesday and Friday Take 1 tablet Tuesday, Thursday, Saturday and Sunday   Yes Historical Provider, MD   No Known Allergies  FAMILY HISTORY:  Family History  Problem Relation Age of Onset  . Heart disease Mother   . Lung disease Father   . Kidney disease     SOCIAL HISTORY:  reports that she quit smoking about 29 years ago. Her smoking use included Cigarettes. She has a 22 pack-year smoking history. She has never used smokeless tobacco. She reports that she does not drink alcohol or use illicit drugs.  REVIEW OF SYSTEMS:  All systems reviewed and found negative except for what I mentioned in the HPI.   SUBJECTIVE:   VITAL SIGNS: Temp:  [96 F (35.6 C)-102.8 F (39.3 C)] 96 F (35.6 C) (02/15 1933) Pulse Rate:  [66-146] 103 (02/15 1900) Resp:  [13-29] 20 (02/15 2200) BP: (84-125)/(32-88) 102/39 mmHg (02/15 2200) SpO2:  [94 %-100 %] 100 % (02/16 0015) Weight:  [213 lb (96.616 kg)-227 lb 1.2 oz (103 kg)] 227 lb 1.2 oz (103 kg) (02/15 1329) HEMODYNAMICS:   VENTILATOR SETTINGS:   INTAKE / OUTPUT: Intake/Output     02 /15 0701 - 02/16 0700   P.O. 240   I.V. (mL/kg) 2140 (20.8)   IV Piggyback 1600   Total Intake(mL/kg) 3980 (38.6)   Urine (mL/kg/hr) 200 (0.1)   Total Output 200   Net +3780         PHYSICAL EXAMINATION: General: Pleasant female patient in no acute distress. Eyes: Anicteric  sclerae. ENT: Oropharynx clear. Moist mucous membranes. No thrush Lymph: No cervical, supraclavicular, or axillary lymphadenopathy. Heart: Normal S1, S2. Slightly tachycardic but improved.  No murmurs, rubs, or gallops appreciated. No bruits, equal pulses. Lungs: Normal excursion, no dullness  to percussion. Good air movement bilaterally, mild wheezes bilaterally but no crackles. Normal upper airway sounds without evidence of stridor. Abdomen: Abdomen soft, non-tender and not distended, normoactive bowel sounds. No hepatosplenomegaly or masses. Musculoskeletal: No clubbing or synovitis. Skin: No rashes or lesions Neuro: No focal neurologic deficits.   LABS:  CBC  Recent Labs Lab 09/03/13 0454 09/03/13 1500  WBC 7.8 7.1  HGB 8.2* 7.9*  HCT 26.7* 25.2*  PLT 160 PLATELETS APPEAR ADEQUATE   Coag's  Recent Labs Lab 08/31/13 0827 09/03/13 0454 09/03/13 1500  APTT  --  58* 58*  INR 2.2 1.90* 2.00*   BMET  Recent Labs Lab 09/03/13 0454 09/03/13 1500  NA 138 140  K 3.6* 4.0  CL 97 102  CO2 27 22  BUN 18 20  CREATININE 1.47* 1.37*  GLUCOSE 98 218*   Electrolytes  Recent Labs Lab 09/03/13 0454 09/03/13 1500  CALCIUM 8.9 8.3*  MG  --  1.9  PHOS  --  3.1   Sepsis Markers  Recent Labs Lab 09/03/13 0519 09/03/13 0540 09/03/13 1500 09/03/13 1650  LATICACIDVEN 1.74 2.2  --  2.4*  PROCALCITON  --   --  26.40  --    ABG No results found for this basename: PHART, PCO2ART, PO2ART,  in the last 168 hours Liver Enzymes  Recent Labs Lab 09/03/13 0454 09/03/13 1500  AST 23 24  ALT 12 12  ALKPHOS 108 97  BILITOT 1.3* 1.2  ALBUMIN 3.3* 3.1*   Cardiac Enzymes  Recent Labs Lab 09/03/13 0454 09/03/13 1500  TROPONINI <0.30  --   PROBNP  --  14053.0*   Glucose No results found for this basename: GLUCAP,  in the last 168 hours  Imaging Dg Chest Port 1 View  09/03/2013   CLINICAL DATA:  73 year old female with altered mental status. Initial encounter.   EXAM: PORTABLE CHEST - 1 VIEW  COMPARISON:  [facility] Chest CT 08/31/2013 and earlier.  FINDINGS: Stable lung volumes from the recent CT. Sequelae of CABG again noted. Stable cardiac size and mediastinal contours. Visualized tracheal air column is within normal limits. No pneumothorax pulmonary edema or pleural effusion. Stable lung markings including basilar predominant increased interstitial opacity.  IMPRESSION: No acute cardiopulmonary abnormality.   Electronically Signed   By: Lars Pinks M.D.   On: 09/03/2013 05:54     CXR:  No evidence of parenchymal lung disease.   ASSESSMENT / PLAN:  PULMONARY A: 1) Hypoxemia, likely secondary to COPD exacerbation. Possibly in the setting of viral infection. No pneumonic infiltrate on CT. No evidence of CHF on CT 2) Mediastinal adenopathy and pulmonary nodule. Will need follow up as outpatient. P:   - Supplemental O2 to keep O2 sat > 92% - RVP negative - Solumedrol 60 mg q12 hrs - Continue symbicort and duonebs - Continue Zosyn, vancomycin for now - She can go back to Levaquin if quick improvement of BP and lactic acidosis and negative cultures.   CARDIOVASCULAR A:  1) Sepsis. Good response to IVF 2) Mild lactic acidosis 3) Elevated BNP. No clinical evidence of fluid overload on exam or chest CT. 4) Chronic A. Fib on coumadin P:  - Sepsis protocol - IVF resuscitation - Antibiotics as above - Continue coumadin - Will hold home antihypertensives  RENAL A:   1) Mild acute on chronic renal failure? P:   - IVF resuscitation - Will follow BMP  GASTROINTESTINAL A:   1) No acute issues 2) GERD P:   -  Home protonix  HEMATOLOGIC A:   1) Anemia, Chronic disease? No evidence of acute bleeding P:  - Will follow CBC  INFECTIOUS A:   1) No clear source of sepsis. RVP negative P:   - Will follow cultures - Started on Zosyn / vanc by Hospitalist - Will switch to Levaquin if cultures negative and clinical improvement.    ENDOCRINE A:   1) No issues   NEUROLOGIC A:  1) No acute issues P:   - Continue home medications  TODAY'S SUMMARY:   I have personally obtained a history, examined the patient, evaluated laboratory and imaging results, formulated the assessment and plan and placed orders. CRITICAL CARE: The patient is critically ill with multiple organ systems failure and requires high complexity decision making for assessment and support, frequent evaluation and titration of therapies, application of advanced monitoring technologies and extensive interpretation of multiple databases. Critical Care Time devoted to patient care services described in this note is 60 minutes.   Waynetta Pean, MD [facility] Pager: [phone]  09/04/2013, 12:47 AM

## 2013-09-04 NOTE — Progress Notes (Signed)
[facility]  [name] IRJ:[id number] DOB: May 25, 1941 DOA: 09/03/2013 PCP: Noralee Space, MD  Brief narrative: 73 year old female patient with chronic atrial fibrillation on Coumadin, chronic kidney disease stage III, hypertension and COPD. Also has chronic diastolic heart failure and is on schedule diuretics. She presented to the emergency department with increasing shortness of breath associated with productive cough of thick green sputum with chills and fevers. Patient reported sudden onset of symptoms 24 hours prior to admission. She did endorse pleuritic chest pain with coughing. In emergency department her blood pressure was low at 84/66 with a heart rate of 133 and a temp of 102.8. She was maintaining O2 saturations of 96% on 2 L of oxygen. Hemoglobin 8.2 INR 1.9. Chest x-ray was without acute findings. CT of the chest with contrast revealed COPD possible underlying sarcoidosis and a nonspecific 5 mm nodule anterior aspect right upper lobe. In the ER she was given multiple nebulizer treatments as well as steroids without any improvement in her symptoms. Because of fever and suspicion for pneumonia she was started on broad-spectrum antibiotics.  Assessment/Plan:    Sepsis -Positive Procalcitonin and presentation with high fever, tachycardia and hypotension -Source appears to be pulmonary -Currently hemodynamically improved without hypotension but BP soft in setting of recurrent RVR    Acute respiratory failure with hypoxia:   A) ? CAP    B) COPD  -Presentation consistent with infectious process and Procalcitonin markedly elevated at 26.4 -Influenza panel negative -Continue broad-spectrum antibiotics and supportive care (see below) -Urinary strep and Legionella screens were negative -Does not appear to be consistent with COPD exacerbation so discontinue Solu-Medrol    Atrial fibrillation with RVR -Seems primarily driven by dehydration with  noted increased heart rate with activity and change in position-rates up to 160 beats per minute -Treat underlying causes -BP soft so having to hold baseline beta blocker for now but will give one-time IV dose and monitor after hydration -stop B agonist inhalers     CKD (chronic kidney disease), stage III -Note acute renal insufficiency related to dehydration -Follow lites    Hypokalemia/ Dehydration -Follow labs -Given a 500 cc normal saline bolus and started on IV fluids at 100 cc per hour    Chronic pain syndrome -Continue home medications as long as not hypotensive    GERD (gastroesophageal reflux disease)    Anemia, chronic disease -Baseline hemoglobin appears to be around 9.5-10.5 -Has dropped to below 8.0 so this could be anemia of critical illness so continue to follow  5 mm nodule in the anterior aspect of the right upper lobe  -will need f/u imaging in 3-6 months   DVT prophylaxis: SCDs Code Status: Full Family Communication: Husband at bedside Disposition Plan/Expected LOS: Step down  Consultants: PCCM  Procedures: None  Antibiotics: Levaquin x1 dose Zosyn 2/15 >>> Vancomycin 2/15 >>>  HPI/Subjective: Currently denies being short of breath or having chest pain. Does feel like she had upper respiratory infection prior to presenting to the emergency department.  Objective: Blood pressure 125/95, pulse 103, temperature 97.2 F (36.2 C), temperature source Oral, resp. rate 14, height 5\' 7"  (1.702 m), weight 104.1 kg (229 lb 8 oz), SpO2 98.00%.  Intake/Output Summary (Last 24 hours) at 09/04/13 1827 Last data filed at 09/04/13 1800  Gross per 24 hour  Intake 2943.5 ml  Output    350 ml  Net 2593.5 ml   Exam: General: No acute respiratory distress at rest in  bedside chair  Lungs: Coarse to auscultation bilaterally without wheezes or crackles, 2 L Cardiovascular: Irregular tachycardia rate and rhythm without murmur gallop or rub normal S1 and S2, no  peripheral edema or JVD Abdomen: Nontender, nondistended, soft, bowel sounds positive, no rebound, no ascites, no appreciable mass Musculoskeletal: No significant cyanosis, clubbing of bilateral lower extremities Neurological: Alert and oriented x 3, moves all extremities x 4 without focal neurological deficits, CN 2-12 intact  Scheduled Meds:  Scheduled Meds: . aspirin EC  81 mg Oral QPM  . budesonide-formoterol  2 puff Inhalation BID  . calcium carbonate  500 mg of elemental calcium Oral BID WC  . Chlorhexidine Gluconate Cloth  6 each Topical Q0600  . cholecalciferol  400 Units Oral Daily  . docusate sodium  200 mg Oral QHS  . ferrous sulfate  325 mg Oral QPM  . gabapentin  600 mg Oral QID  . guaiFENesin  1,200 mg Oral BID  . multivitamin with minerals  1 tablet Oral Daily  . mupirocin ointment  1 application Nasal BID  . pantoprazole  40 mg Oral Daily  . piperacillin-tazobactam (ZOSYN)  IV  3.375 g Intravenous 3 times per day  . simvastatin  20 mg Oral QHS  . vancomycin  1,500 mg Intravenous Q24H  . Warfarin - Pharmacist Dosing Inpatient   Does not apply q1800   Data Reviewed: Basic Metabolic Panel:  Recent Labs Lab 09/03/13 0454 09/03/13 1500 09/04/13 0435  NA 138 140 137  K 3.6* 4.0 4.7  CL 97 102 101  CO2 27 22 23   GLUCOSE 98 218* 182*  BUN 18 20 25*  CREATININE 1.47* 1.37* 1.32*  CALCIUM 8.9 8.3* 8.6  MG  --  1.9  --   PHOS  --  3.1  --    Liver Function Tests:  Recent Labs Lab 09/03/13 0454 09/03/13 1500 09/04/13 0435  AST 23 24 25   ALT 12 12 14   ALKPHOS 108 97 85  BILITOT 1.3* 1.2 0.9  PROT 7.0 7.0 6.9  ALBUMIN 3.3* 3.1* 3.1*   CBC:  Recent Labs Lab 09/03/13 0454 09/03/13 1500 09/04/13 0435  WBC 7.8 7.1 7.3  NEUTROABS 5.3 5.9  --   HGB 8.2* 7.9* 7.7*  HCT 26.7* 25.2* 25.7*  MCV 108.5* 109.1* 109.4*  PLT 160 PLATELETS APPEAR ADEQUATE 92*   Cardiac Enzymes:  Recent Labs Lab 09/03/13 0454  TROPONINI <0.30   BNP (last 3  results)  Recent Labs  09/03/13 1500  PROBNP 14053.0*    Recent Results (from the past 240 hour(s))  CULTURE, BLOOD (ROUTINE X 2)     Status: None   Collection Time    09/03/13  5:00 AM      Result Value Ref Range Status   Specimen Description BLOOD ARM LEFT   Final   Special Requests BOTTLES DRAWN AEROBIC AND ANAEROBIC 10CC   Final   Culture  Setup Time     Final   Value: 09/03/2013 13:28     Performed at [facility]   Culture     Final   Value:        BLOOD CULTURE RECEIVED NO GROWTH TO DATE CULTURE WILL BE HELD FOR 5 DAYS BEFORE ISSUING A FINAL NEGATIVE REPORT     Performed at [facility]   Report Status PENDING   Incomplete  CULTURE, BLOOD (ROUTINE X 2)     Status: None   Collection Time    09/03/13  5:15 AM  Result Value Ref Range Status   Specimen Description BLOOD ARM RIGHT   Final   Special Requests BOTTLES DRAWN AEROBIC ONLY 5CC   Final   Culture  Setup Time     Final   Value: 09/03/2013 13:29     Performed at [facility]   Culture     Final   Value:        BLOOD CULTURE RECEIVED NO GROWTH TO DATE CULTURE WILL BE HELD FOR 5 DAYS BEFORE ISSUING A FINAL NEGATIVE REPORT     Performed at [facility]   Report Status PENDING   Incomplete  RAPID STREP SCREEN     Status: None   Collection Time    09/03/13  6:31 AM      Result Value Ref Range Status   Streptococcus, Group A Screen (Direct) NEGATIVE  NEGATIVE Final   Comment: (NOTE)     A Rapid Antigen test may result negative if the antigen level in the     sample is below the detection level of this test. The FDA has not     cleared this test as a stand-alone test therefore the rapid antigen     negative result has reflexed to a Group A Strep culture.  CULTURE, GROUP A STREP     Status: None   Collection Time    09/03/13  6:31 AM      Result Value Ref Range Status   Specimen Description THROAT   Final   Special Requests NONE   Final   Culture     Final   Value: NO SUSPICIOUS  COLONIES, CONTINUING TO HOLD     Performed at [facility]   Report Status PENDING   Incomplete  URINE CULTURE     Status: None   Collection Time    09/03/13  7:43 AM      Result Value Ref Range Status   Specimen Description URINE, CATHETERIZED   Final   Special Requests NONE   Final   Culture  Setup Time     Final   Value: 09/03/2013 18:08     Performed at [place]     Final   Value: NO GROWTH     Performed at [facility]   Culture     Final   Value: NO GROWTH     Performed at [facility]   Report Status 09/04/2013 FINAL   Final  MRSA PCR SCREENING     Status: Abnormal   Collection Time    09/03/13  1:22 PM      Result Value Ref Range Status   MRSA by PCR POSITIVE (*) NEGATIVE Final   Comment:            The GeneXpert MRSA Assay (FDA     approved for NASAL specimens     only), is one component of a     comprehensive MRSA colonization     surveillance program. It is not     intended to diagnose MRSA     infection nor to guide or     monitor treatment for     MRSA infections.     CRITICAL RESULT CALLED TO, READ BACK BY AND VERIFIED WITH:     Bethann Berkshire 1450 09/03/13 SCALES H     Studies:  Recent x-ray studies have been reviewed in detail by the Attending Physician  Time spent : 35 mins  Erin Hearing, ANP [name] Office  [phone] Pager [phone]  **If unable to reach the above provider after paging please contact the [place] @ [phone]  On-Call/Text Page:      [email or web address]      password [email or web address]  If 7PM-7AM, please contact night-coverage [email or web address] Password [facility] 09/04/2013, 6:27 PM   LOS: 1 day   I have personally examined this patient and reviewed the entire database. I have reviewed the above note, made any necessary editorial changes, and agree with its content.  Cherene Altes, MD [name]

## 2013-09-04 NOTE — Progress Notes (Signed)
Nutrition Brief Note  Reason for Assessment: Consult - COPD protocol   73 y.o. female  Admitting Dx: Acute respiratory failure with hypoxia  ASSESSMENT: 73 year old female with past medical history of atrial fibrillation on coumadin, CKD stage III, HTN, COPD, chronic diastolic CHF who presented to [facility] ED 09/03/2013 with complaints of worsening shortness of breath, associated cough productive of thick green sputum, fevers and chills. CT chest with contrast revealed likely COPD, mediastinal and bilateral hilar lymphadenopathy, possible underlying sarcoidosis; there was also a new nonspecific 5 mm nodule in the anterior aspect of the right upper lobe.  Pt reported to have a good appetite currently. Pt reports around Thanksgiving/ Christmas time 2014, she has had a decreased appetite due to an acute illness. Within the last 2 weeks her appetite has improved and she has been eating more. Reports "eats anything I see". Pt denies any stomach pains or nausea (except for occasional heart burn for which she takes Protonix for) . Pt reports her usual body weight of 215 lbs and reports gradual weight loss from July of 2014, but weight loss has not been significant as she reports it was mainly fluid loss. Meal completion in the hospital is 100%.  Nutrition focused physical exam to be within normal limits. No intervention recommended at this time.   Wt Readings from Last 10 Encounters:  09/04/13 229 lb 8 oz (104.1 kg)  08/25/13 215 lb 6.4 oz (97.705 kg)  07/25/13 211 lb 3.2 oz (95.8 kg)  07/18/13 219 lb 9.6 oz (99.61 kg)  05/24/13 218 lb 3.2 oz (98.975 kg)  03/22/13 245 lb (111.131 kg)  01/23/13 247 lb 9.6 oz (112.311 kg)  12/28/12 243 lb 11.2 oz (110.542 kg)  10/28/12 248 lb (112.492 kg)  10/24/12 256 lb (116.121 kg)   Body mass index is 35.94 kg/(m^2).  [facility] Pager: [phone]  Intern note/chart reviewed. Revisions made.  [place], [place], [place] Pager [phone]  After Hours Pager

## 2013-09-04 NOTE — Progress Notes (Signed)
[facility]  Rabecca Birge SNK:[id number] DOB: [birth year] DOA: 09/03/2013 PCP: Noralee Space, MD  Brief narrative: 73 year old female patient with chronic atrial fibrillation on Coumadin, chronic kidney disease stage III, hypertension and COPD. Also has chronic diastolic heart failure and is on schedule diuretics. She presented to the emergency department with increasing shortness of breath associated with productive cough of thick green sputum with chills and fevers. Patient reported sudden onset of symptoms 24 hours prior to admission. She did endorse pleuritic chest pain with coughing. In emergency department her blood pressure was low at 84/66 with a heart rate of 133 and a temp of 102.8. She was maintaining O2 saturations of 96% on 2 L of oxygen. Hemoglobin 8.2 INR 1.9. Chest x-ray was without acute findings. CT of the chest with contrast revealed COPD possible underlying sarcoidosis and a nonspecific 5 mm nodule anterior aspect right upper lobe. In the ER she was given multiple nebulizer treatments as well as steroids without any improvement in her symptoms. Because of fever and suspicion for pneumonia she was started on broad-spectrum antibiotics.  Assessment/Plan:    Sepsis -Positive Procalcitonin and presentation with high fever, tachycardia and hypotension -Source appears to be pulmonary -Currently hemodynamically improved without hypotension but BP soft in setting of recurrent RVR    Acute respiratory failure with hypoxia:   A) ? CAP    B) COPD  -Presentation consistent with infectious process and Procalcitonin markedly elevated at 26.4 -Influenza panel negative -Continue broad-spectrum antibiotics and supportive care (see below) -Urinary strep and Legionella screens were negative -Does not appear to be consistent with COPD exacerbation so discontinue Solu-Medrol    Atrial fibrillation with RVR -Seems primarily driven by dehydration with  noted increased heart rate with activity and change in position-rates up to 160 beats per minute -Treat underlying causes -BP soft so having to hold baseline beta blocker for now but will give one-time IV dose and monitor after hydration -stop B agonist inhalers     CKD (chronic kidney disease), stage III -Note acute renal insufficiency related to dehydration -Follow lites    Hypokalemia/ Dehydration -Follow labs -Given a 500 cc normal saline bolus and started on IV fluids at 100 cc per hour    Chronic pain syndrome -Continue home medications as long as not hypotensive    GERD (gastroesophageal reflux disease)    Anemia, chronic disease -Baseline hemoglobin appears to be around 9.5-10.5 -Has dropped to below 8.0 so this could be anemia of critical illness so continue to follow  DVT prophylaxis: SCDs Code Status: Full Family Communication: Husband at bedside Disposition Plan/Expected LOS: Step down  Consultants: PCCM  Procedures: None  Antibiotics: Levaquin x1 dose Zosyn 2/15 >>> Vancomycin 2/15 >>>  HPI/Subjective: Currently denies being short of breath or having chest pain. Does feel like she had upper respiratory infection prior to presenting to the emergency department.  Objective: Blood pressure 125/95, pulse 103, temperature 97.2 F (36.2 C), temperature source Oral, resp. rate 15, height 5\' 7"  (1.702 m), weight 104.1 kg (229 lb 8 oz), SpO2 99.00%.  Intake/Output Summary (Last 24 hours) at 09/04/13 1819 Last data filed at 09/04/13 1800  Gross per 24 hour  Intake 2583.5 ml  Output    350 ml  Net 2233.5 ml   Exam: General: No acute respiratory distress at rest in bedside chair  Lungs: Coarse to auscultation bilaterally without wheezes or crackles, 2 L Cardiovascular: Irregular tachycardia rate and rhythm without murmur  gallop or rub normal S1 and S2, no peripheral edema or JVD Abdomen: Nontender, nondistended, soft, bowel sounds positive, no rebound, no  ascites, no appreciable mass Musculoskeletal: No significant cyanosis, clubbing of bilateral lower extremities Neurological: Alert and oriented x 3, moves all extremities x 4 without focal neurological deficits, CN 2-12 intact  Scheduled Meds:  Scheduled Meds: . aspirin EC  81 mg Oral QPM  . budesonide-formoterol  2 puff Inhalation BID  . calcium carbonate  500 mg of elemental calcium Oral BID WC  . Chlorhexidine Gluconate Cloth  6 each Topical Q0600  . cholecalciferol  400 Units Oral Daily  . docusate sodium  200 mg Oral QHS  . ferrous sulfate  325 mg Oral QPM  . gabapentin  600 mg Oral QID  . guaiFENesin  1,200 mg Oral BID  . multivitamin with minerals  1 tablet Oral Daily  . mupirocin ointment  1 application Nasal BID  . pantoprazole  40 mg Oral Daily  . piperacillin-tazobactam (ZOSYN)  IV  3.375 g Intravenous 3 times per day  . simvastatin  20 mg Oral QHS  . sodium chloride  3 mL Intravenous Q12H  . vancomycin  1,500 mg Intravenous Q24H  . Warfarin - Pharmacist Dosing Inpatient   Does not apply q1800   Data Reviewed: Basic Metabolic Panel:  Recent Labs Lab 09/03/13 0454 09/03/13 1500 09/04/13 0435  NA 138 140 137  K 3.6* 4.0 4.7  CL 97 102 101  CO2 27 22 23   GLUCOSE 98 218* 182*  BUN 18 20 25*  CREATININE 1.47* 1.37* 1.32*  CALCIUM 8.9 8.3* 8.6  MG  --  1.9  --   PHOS  --  3.1  --    Liver Function Tests:  Recent Labs Lab 09/03/13 0454 09/03/13 1500 09/04/13 0435  AST 23 24 25   ALT 12 12 14   ALKPHOS 108 97 85  BILITOT 1.3* 1.2 0.9  PROT 7.0 7.0 6.9  ALBUMIN 3.3* 3.1* 3.1*   CBC:  Recent Labs Lab 09/03/13 0454 09/03/13 1500 09/04/13 0435  WBC 7.8 7.1 7.3  NEUTROABS 5.3 5.9  --   HGB 8.2* 7.9* 7.7*  HCT 26.7* 25.2* 25.7*  MCV 108.5* 109.1* 109.4*  PLT 160 PLATELETS APPEAR ADEQUATE 92*   Cardiac Enzymes:  Recent Labs Lab 09/03/13 0454  TROPONINI <0.30   BNP (last 3 results)  Recent Labs  09/03/13 1500  PROBNP 14053.0*    Recent  Results (from the past 240 hour(s))  CULTURE, BLOOD (ROUTINE X 2)     Status: None   Collection Time    09/03/13  5:00 AM      Result Value Ref Range Status   Specimen Description BLOOD ARM LEFT   Final   Special Requests BOTTLES DRAWN AEROBIC AND ANAEROBIC 10CC   Final   Culture  Setup Time     Final   Value: 09/03/2013 13:28     Performed at [facility]   Culture     Final   Value:        BLOOD CULTURE RECEIVED NO GROWTH TO DATE CULTURE WILL BE HELD FOR 5 DAYS BEFORE ISSUING A FINAL NEGATIVE REPORT     Performed at [facility]   Report Status PENDING   Incomplete  CULTURE, BLOOD (ROUTINE X 2)     Status: None   Collection Time    09/03/13  5:15 AM      Result Value Ref Range Status   Specimen Description BLOOD  ARM RIGHT   Final   Special Requests BOTTLES DRAWN AEROBIC ONLY 5CC   Final   Culture  Setup Time     Final   Value: 09/03/2013 13:29     Performed at [facility]   Culture     Final   Value:        BLOOD CULTURE RECEIVED NO GROWTH TO DATE CULTURE WILL BE HELD FOR 5 DAYS BEFORE ISSUING A FINAL NEGATIVE REPORT     Performed at [facility]   Report Status PENDING   Incomplete  RAPID STREP SCREEN     Status: None   Collection Time    09/03/13  6:31 AM      Result Value Ref Range Status   Streptococcus, Group A Screen (Direct) NEGATIVE  NEGATIVE Final   Comment: (NOTE)     A Rapid Antigen test may result negative if the antigen level in the     sample is below the detection level of this test. The FDA has not     cleared this test as a stand-alone test therefore the rapid antigen     negative result has reflexed to a Group A Strep culture.  CULTURE, GROUP A STREP     Status: None   Collection Time    09/03/13  6:31 AM      Result Value Ref Range Status   Specimen Description THROAT   Final   Special Requests NONE   Final   Culture     Final   Value: NO SUSPICIOUS COLONIES, CONTINUING TO HOLD     Performed at [facility]    Report Status PENDING   Incomplete  URINE CULTURE     Status: None   Collection Time    09/03/13  7:43 AM      Result Value Ref Range Status   Specimen Description URINE, CATHETERIZED   Final   Special Requests NONE   Final   Culture  Setup Time     Final   Value: 09/03/2013 18:08     Performed at [place]     Final   Value: NO GROWTH     Performed at [facility]   Culture     Final   Value: NO GROWTH     Performed at [facility]   Report Status 09/04/2013 FINAL   Final  MRSA PCR SCREENING     Status: Abnormal   Collection Time    09/03/13  1:22 PM      Result Value Ref Range Status   MRSA by PCR POSITIVE (*) NEGATIVE Final   Comment:            The GeneXpert MRSA Assay (FDA     approved for NASAL specimens     only), is one component of a     comprehensive MRSA colonization     surveillance program. It is not     intended to diagnose MRSA     infection nor to guide or     monitor treatment for     MRSA infections.     CRITICAL RESULT CALLED TO, READ BACK BY AND VERIFIED WITH:     Bethann Berkshire 1450 09/03/13 SCALES H     Studies:  Recent x-ray studies have been reviewed in detail by the Attending Physician  Time spent : [place], ANP [name] Office  [phone] Pager [phone]  **  If unable to reach the above provider after paging please contact the Flow Manager @ [phone]  On-Call/Text Page:      [email or web address]      password [email or web address]  If 7PM-7AM, please contact night-coverage [email or web address] Password [facility] 09/04/2013, 6:19 PM   LOS: 1 day   I have personally examined this patient and reviewed the entire database. I have reviewed the above note, made any necessary editorial changes, and agree with its content.  Cherene Altes, MD [name]

## 2013-09-04 NOTE — Evaluation (Signed)
Occupational Therapy Evaluation Patient Details Name: [name] MRN: [id number] DOB: 1941/02/10 Today's Date: 09/04/2013 Time: 9323-5573 OT Time Calculation (min): 28 min  OT Assessment / Plan / Recommendation History of present illness 73 year old female with past medical history of atrial fibrillation on coumadin, CKD stage III, HTN, COPD, chronic diastolic CHF who presented to [facility] ED 09/03/2013 with complaints of worsening shortness of breath, associated cough productive of thick green sputum, fevers and chillCT chest with contrast revealed likely COPD, mediastinal and bilateral hilar lymphadenopathy, possible underlying sarcoidosis; there was also a new nonspecific 5 mm nodule in the anterior aspect of the right upper lobe which requires follow up in 6-12 months to re-evaluate if this is malignant lesion such as bronchogenic carcinomas   Clinical Impression   Pt admitted with above. She demonstrates the below listed deficits and will benefit from continued OT to maximize safety and independence with BADLs.  Activity limited to stand pivot transfer to [facility] then to recliner due to increased HR 168-178 with minimal activity.  She currently requires supervision for BADLs and anticipate she will quickly be back to her baseline.      OT Assessment  Patient needs continued OT Services    Follow Up Recommendations  No OT follow up;Supervision - Intermittent    Barriers to Discharge      Equipment Recommendations  None recommended by OT    Recommendations for Other Services    Frequency  Min 2X/week    Precautions / Restrictions Precautions Precautions: Other (comment) Precaution Comments: watch HR   Pertinent Vitals/Pain     ADL  Eating/Feeding: Independent Where Assessed - Eating/Feeding: Chair Grooming: Wash/dry hands;Wash/dry face;Teeth care;Brushing hair;Set up Where Assessed - Grooming: Supported sitting Upper Body Bathing: Supervision/safety Where Assessed - Upper Body  Bathing: Supported sitting Lower Body Bathing: Supervision/safety Where Assessed - Lower Body Bathing: Supported sit to stand Upper Body Dressing: Set up Where Assessed - Upper Body Dressing: Unsupported sitting Lower Body Dressing: Supervision/safety Where Assessed - Lower Body Dressing: Supported sit to Lobbyist: Supervision/safety Armed forces technical officer Method: Sit to stand;Stand Ecologist: Therapist, occupational and Hygiene: Supervision/safety Where Assessed - Best boy and Hygiene: Standing Transfers/Ambulation Related to ADLs: S for transfers ADL Comments: HR increased 138-178 with transfer to [facility].  Pt was then transferred to recliner.  No furhter activity beside toileting performed due to increased HR.  Above is simulated based on how well she moved with toileting and LB ADLs as she did don socks and change underpants while toileting.     OT Diagnosis: Generalized weakness  OT Problem List: Decreased strength;Decreased activity tolerance;Decreased knowledge of use of DME or AE;Cardiopulmonary status limiting activity OT Treatment Interventions: Self-care/ADL training;DME and/or AE instruction;Therapeutic activities;Patient/family education   OT Goals(Current goals can be found in the care plan section) Acute Rehab OT Goals Patient Stated Goal: To go home OT Goal Formulation: With patient Time For Goal Achievement: 09/11/13 Potential to Achieve Goals: Good ADL Goals Pt Will Perform Grooming: with modified independence;standing Pt Will Perform Upper Body Bathing: with modified independence;sitting Pt Will Perform Lower Body Bathing: with modified independence;sit to/from stand Pt Will Perform Upper Body Dressing: with modified independence;sitting Pt Will Perform Lower Body Dressing: with modified independence;sit to/from stand Pt Will Transfer to Toilet: with modified independence;ambulating;regular  height toilet;grab bars Pt Will Perform Toileting - Clothing Manipulation and hygiene: with modified independence;sit to/from stand Pt Will Perform Tub/Shower Transfer: Tub transfer;with modified independence;ambulating;shower seat  Visit Information  Last OT Received On: 09/04/13 Assistance Needed: +1 History of Present Illness: 73 year old female with past medical history of atrial fibrillation on coumadin, CKD stage III, HTN, COPD, chronic diastolic CHF who presented to [facility] ED 09/03/2013 with complaints of worsening shortness of breath, associated cough productive of thick green sputum, fevers and chillCT chest with contrast revealed likely COPD, mediastinal and bilateral hilar lymphadenopathy, possible underlying sarcoidosis; there was also a new nonspecific 5 mm nodule in the anterior aspect of the right upper lobe which requires follow up in 6-12 months to re-evaluate if this is malignant lesion such as bronchogenic carcinomas       Prior Functioning     Home Living Family/patient expects to be discharged to:: Private residence Living Arrangements: Spouse/significant other Available Help at Discharge: Family;Available 24 hours/day Type of Home: House Home Access: Ramped entrance;Stairs to enter Entrance Stairs-Number of Steps: 3 Entrance Stairs-Rails: Right;Left;Can reach both Home Layout: One level Home Equipment: Walker - 2 wheels;Wheelchair - Regulatory affairs officer - single point;Bedside commode;Hand held shower head;Grab bars - tub/shower Prior Function Level of Independence: Needs assistance ADL's / Homemaking Assistance Needed: Requires assist with IADLs Communication Communication: No difficulties Dominant Hand: Right         Vision/Perception     Cognition  Cognition Arousal/Alertness: Awake/alert Behavior During Therapy: WFL for tasks assessed/performed Overall Cognitive Status: Within Functional Limits for tasks assessed    Extremity/Trunk Assessment Upper  Extremity Assessment Upper Extremity Assessment: Overall WFL for tasks assessed Lower Extremity Assessment Lower Extremity Assessment: Defer to PT evaluation Cervical / Trunk Assessment Cervical / Trunk Assessment: Normal     Mobility Bed Mobility Overal bed mobility: Modified Independent Transfers Overall transfer level: Needs assistance Transfers: Sit to/from Stand;Stand Pivot Transfers Sit to Stand: Supervision Stand pivot transfers: Supervision General transfer comment: supervision for lines and cardiopulmonary supervision only     Exercise     Balance Balance Overall balance assessment: No apparent balance deficits (not formally assessed)   End of Session OT - End of Session Activity Tolerance: Treatment limited secondary to medical complications (Comment) Patient left: in chair;with call bell/phone within reach Nurse Communication: Mobility status  GO     Latonya Nelon M 09/04/2013, 2:47 PM

## 2013-09-04 NOTE — Progress Notes (Signed)
ANTICOAGULATION CONSULT NOTE - Follow Up Consult  Pharmacy Consult for Coumadin Indication: atrial fibrillation  No Known Allergies  Patient Measurements: Height: 5\' 7"  (170.2 cm) Weight: 229 lb 8 oz (104.1 kg) IBW/kg (Calculated) : 61.6  Vital Signs: Temp: 98.2 F (36.8 C) (02/16 0800) Temp src: Oral (02/16 0800) BP: 122/85 mmHg (02/16 0900)  Labs:  Recent Labs  09/03/13 0454 09/03/13 1500 09/04/13 0435 09/04/13 0950  HGB 8.2* 7.9* 7.7*  --   HCT 26.7* 25.2* 25.7*  --   PLT 160 PLATELETS APPEAR ADEQUATE 92*  --   APTT 58* 58*  --   --   LABPROT 21.2* 22.1*  --  23.5*  INR 1.90* 2.00*  --  2.17*  CREATININE 1.47* 1.37* 1.32*  --   TROPONINI <0.30  --   --   --     Estimated Creatinine Clearance: 48.5 ml/min (by C-G formula based on Cr of 1.32).   Medications:  Scheduled:  . amitriptyline  100 mg Oral QHS  . aspirin EC  81 mg Oral QPM  . budesonide-formoterol  2 puff Inhalation BID  . calcium carbonate  500 mg of elemental calcium Oral BID WC  . Chlorhexidine Gluconate Cloth  6 each Topical Q0600  . cholecalciferol  400 Units Oral Daily  . docusate sodium  200 mg Oral QHS  . ferrous sulfate  325 mg Oral QPM  . gabapentin  600 mg Oral QID  . guaiFENesin  1,200 mg Oral BID  . ipratropium-albuterol  3 mL Nebulization Q4H  . methylPREDNISolone (SOLU-MEDROL) injection  60 mg Intravenous Q12H  . multivitamin with minerals  1 tablet Oral Daily  . mupirocin ointment  1 application Nasal BID  . pantoprazole  40 mg Oral Daily  . piperacillin-tazobactam (ZOSYN)  IV  3.375 g Intravenous 3 times per day  . simvastatin  20 mg Oral QHS  . sodium chloride  3 mL Intravenous Q12H  . vancomycin  1,500 mg Intravenous Q24H  . Warfarin - Pharmacist Dosing Inpatient   Does not apply q1800    Assessment: 73 yo F with h/o Afib, CKDIII, COPD, HFpEF presenting on 2/15 with SOB, productive cough, fevers and chills since yesterday. Pharmacy has been consulted to manage her  anticoagulation while she is inpatient.  Per patient, home dose is 5 mg daily except 2.5 mg on M,W,F. Last dose taken on 2/14. Current INR is therapeutic at 2.17 after receiving a dose slightly higher than her home regimen. Since INR is on the low end of therapeutic range, will give a dose slightly higher than her normal regimen. Home regimen will likely be continued tomorrow or the day after, pending her INR.  CBC: H/H low but stable, plt low, no reported s/s bleeding.   Goal of Therapy:  INR 2-3 Monitor platelets by anticoagulation protocol: Yes   Plan:  -Coumadin 4 mg PO x 1 dose tonight. -Daily INR -Monitor s/s of bleeding  Layman Gully C. Rishi Vicario, PharmD Clinical Pharmacist-Resident Pager: [phone] Pharmacy: [phone] 09/04/2013 11:55 AM

## 2013-09-04 NOTE — Progress Notes (Signed)
Utilization Review Completed.[name] T2/16/2015  

## 2013-09-04 NOTE — Evaluation (Signed)
Physical Therapy Evaluation Patient Details Name: [name] MRN: [id number] DOB: 23-Mar-1941 Today's Date: 09/04/2013 Time: 5993-5701 PT Time Calculation (min): 18 min  PT Assessment / Plan / Recommendation History of Present Illness  73 year old female with past medical history of atrial fibrillation on coumadin, CKD stage III, HTN, COPD, chronic diastolic CHF who presented to [facility] ED 09/03/2013 with complaints of worsening shortness of breath, associated cough productive of thick green sputum, fevers and chillCT chest with contrast revealed likely COPD, mediastinal and bilateral hilar lymphadenopathy, possible underlying sarcoidosis; there was also a new nonspecific 5 mm nodule in the anterior aspect of the right upper lobe which requires follow up in 6-12 months to re-evaluate if this is malignant lesion such as bronchogenic carcinomas  Clinical Impression  Pt very pleasant and talkative but limited activity by HR. Erin Hearing and RN aware of activity and vital signs and agreeable to OOB to chair. Will follow acutely to maximize activity tolerance to return pt to independent level for safe return home with spouse. Pt below deficits and will benefit from acute therapy as stated above. Pt encouraged to mobilize with nursing later if HR allows.     PT Assessment  Patient needs continued PT services    Follow Up Recommendations  No PT follow up    Does the patient have the potential to tolerate intense rehabilitation      Barriers to Discharge        Equipment Recommendations  None recommended by PT    Recommendations for Other Services     Frequency Min 3X/week    Precautions / Restrictions Precautions Precautions: Other (comment) Precaution Comments: watch HR Restrictions Weight Bearing Restrictions: No   Pertinent Vitals/Pain HR 115 in Bed with transfer to EOB HR 138-165 with consistent jumping rate with Afib Transfer to chair HR 160-175. After 5 min seated in chair HR  back to 115 sats 96% throughout on RA BP 114/97 before and 120/93 after No pain      Mobility  Bed Mobility Overal bed mobility: Modified Independent Transfers Overall transfer level: Needs assistance Transfers: Sit to/from Stand;Stand Pivot Transfers Sit to Stand: Supervision Stand pivot transfers: Supervision General transfer comment: supervision for lines and cardiopulmonary supervision only    Exercises General Exercises - Lower Extremity Long Arc Quad: AROM;Seated;Both;10 reps Hip Flexion/Marching: AROM;Seated;Both;10 reps   PT Diagnosis: Difficulty walking  PT Problem List: Decreased activity tolerance PT Treatment Interventions: Gait training;Functional mobility training;Therapeutic activities;Patient/family education     PT Goals(Current goals can be found in the care plan section) Acute Rehab PT Goals Patient Stated Goal: be able to breathe and go home PT Goal Formulation: With patient/family Time For Goal Achievement: 09/18/13 Potential to Achieve Goals: Good  Visit Information  Last PT Received On: 09/04/13 Assistance Needed: +1 History of Present Illness: 73 year old female with past medical history of atrial fibrillation on coumadin, CKD stage III, HTN, COPD, chronic diastolic CHF who presented to [facility] ED 09/03/2013 with complaints of worsening shortness of breath, associated cough productive of thick green sputum, fevers and chillCT chest with contrast revealed likely COPD, mediastinal and bilateral hilar lymphadenopathy, possible underlying sarcoidosis; there was also a new nonspecific 5 mm nodule in the anterior aspect of the right upper lobe which requires follow up in 6-12 months to re-evaluate if this is malignant lesion such as bronchogenic carcinomas       Prior Functioning  Home Living Family/patient expects to be discharged to:: Private residence Living Arrangements: Spouse/significant  other Available Help at Discharge: Family;Available 24  hours/day Type of Home: House Home Access: Ramped entrance;Stairs to enter Entrance Stairs-Number of Steps: 3 Entrance Stairs-Rails: Right;Left;Can reach both Home Layout: One level Home Equipment: Walker - 2 wheels;Wheelchair - Regulatory affairs officer - single point;Bedside commode;Hand held shower head;Grab bars - tub/shower Prior Function Level of Independence: Independent Communication Communication: No difficulties Dominant Hand: Right    Cognition  Cognition Arousal/Alertness: Awake/alert Behavior During Therapy: WFL for tasks assessed/performed Overall Cognitive Status: Within Functional Limits for tasks assessed    Extremity/Trunk Assessment Upper Extremity Assessment Upper Extremity Assessment: Overall WFL for tasks assessed Lower Extremity Assessment Lower Extremity Assessment: Overall WFL for tasks assessed;LLE deficits/detail LLE Deficits / Details: hip flexion 3/5, knee extension and flexion 4/5   Balance Balance Overall balance assessment: No apparent balance deficits (not formally assessed)  End of Session PT - End of Session Activity Tolerance: Treatment limited secondary to medical complications (Comment) Patient left: in chair;with family/visitor present Nurse Communication: Mobility status  GP     Melford Aase 09/04/2013, 10:41 AM Elwyn Reach, [place]

## 2013-09-05 DIAGNOSIS — J449 Chronic obstructive pulmonary disease, unspecified: Secondary | ICD-10-CM

## 2013-09-05 DIAGNOSIS — E860 Dehydration: Secondary | ICD-10-CM

## 2013-09-05 LAB — CULTURE, GROUP A STREP

## 2013-09-05 LAB — BASIC METABOLIC PANEL
BUN: 31 mg/dL — AB (ref 6–23)
CHLORIDE: 102 meq/L (ref 96–112)
CO2: 19 mEq/L (ref 19–32)
Calcium: 8.5 mg/dL (ref 8.4–10.5)
Creatinine, Ser: 1.37 mg/dL — ABNORMAL HIGH (ref 0.50–1.10)
GFR calc non Af Amer: 38 mL/min — ABNORMAL LOW (ref >90)
GFR, EST AFRICAN AMERICAN: 44 mL/min — AB (ref >90)
GLUCOSE: 113 mg/dL — AB (ref 70–99)
Potassium: 4.5 mEq/L (ref 3.7–5.3)
Sodium: 137 mEq/L (ref 137–147)

## 2013-09-05 LAB — CBC
HEMATOCRIT: 28.1 % — AB (ref 36.0–46.0)
Hemoglobin: 8.5 g/dL — ABNORMAL LOW (ref 12.0–15.0)
MCH: 33.5 pg (ref 26.0–34.0)
MCHC: 30.2 g/dL (ref 30.0–36.0)
MCV: 110.6 fL — ABNORMAL HIGH (ref 78.0–100.0)
Platelets: 111 10*3/uL — ABNORMAL LOW (ref 150–400)
RBC: 2.54 MIL/uL — ABNORMAL LOW (ref 3.87–5.11)
RDW: 21.9 % — AB (ref 11.5–15.5)
WBC: 6 10*3/uL (ref 4.0–10.5)

## 2013-09-05 LAB — LACTIC ACID, PLASMA: LACTIC ACID, VENOUS: 1 mmol/L (ref 0.5–2.2)

## 2013-09-05 LAB — PROTIME-INR
INR: 2.39 — ABNORMAL HIGH (ref 0.00–1.49)
PROTHROMBIN TIME: 25.3 s — AB (ref 11.6–15.2)

## 2013-09-05 LAB — PROCALCITONIN: Procalcitonin: 18.09 ng/mL

## 2013-09-05 LAB — CORTISOL: Cortisol, Plasma: 3 ug/dL

## 2013-09-05 MED ORDER — METOPROLOL TARTRATE 25 MG PO TABS
25.0000 mg | ORAL_TABLET | Freq: Two times a day (BID) | ORAL | Status: DC
  Administered 2013-09-05 – 2013-09-07 (×5): 25 mg via ORAL
  Filled 2013-09-05 (×6): qty 1

## 2013-09-05 MED ORDER — GABAPENTIN 600 MG PO TABS
600.0000 mg | ORAL_TABLET | Freq: Four times a day (QID) | ORAL | Status: DC
  Administered 2013-09-05 – 2013-09-07 (×8): 600 mg via ORAL
  Filled 2013-09-05 (×15): qty 1

## 2013-09-05 MED ORDER — WARFARIN SODIUM 4 MG PO TABS
4.0000 mg | ORAL_TABLET | Freq: Once | ORAL | Status: AC
  Administered 2013-09-05: 4 mg via ORAL
  Filled 2013-09-05 (×2): qty 1

## 2013-09-05 MED ORDER — ATORVASTATIN CALCIUM 10 MG PO TABS
10.0000 mg | ORAL_TABLET | Freq: Every day | ORAL | Status: DC
  Administered 2013-09-05 – 2013-09-06 (×2): 10 mg via ORAL
  Filled 2013-09-05 (×3): qty 1

## 2013-09-05 MED ORDER — DILTIAZEM HCL 100 MG IV SOLR
5.0000 mg/h | INTRAVENOUS | Status: AC
  Filled 2013-09-05: qty 100

## 2013-09-05 NOTE — Progress Notes (Signed)
[facility]  Bernise Tyminski QZE:[id number] DOB: 06-17-1941 DOA: 09/03/2013 PCP: Michele Mcalpine, MD  Brief narrative: 73 year old female patient with chronic atrial fibrillation on Coumadin, chronic kidney disease stage III, hypertension and COPD. Also has chronic diastolic heart failure and is on schedule diuretics. She presented to the emergency department with increasing shortness of breath associated with productive cough of thick green sputum with chills and fevers. Patient reported sudden onset of symptoms 24 hours prior to admission. She did endorse pleuritic chest pain with coughing. In emergency department her blood pressure was low at 84/66 with a heart rate of 133 and a temp of 102.8. She was maintaining O2 saturations of 96% on 2 L of oxygen. Hemoglobin 8.2 INR 1.9. Chest x-ray was without acute findings. CT of the chest with contrast revealed COPD possible underlying sarcoidosis and a nonspecific 5 mm nodule anterior aspect right upper lobe. In the ER she was given multiple nebulizer treatments as well as steroids without any improvement in her symptoms. Because of fever and suspicion for pneumonia she was started on broad-spectrum antibiotics.  Assessment/Plan:    Sepsis -Positive Procalcitonin and presentation with high fever, tachycardia and hypotension -Source appears to be pulmonary -Currently hemodynamically improved without hypotension but BP soft in setting of recurrent RVR -lactic acidosis has resolved    Acute respiratory failure with hypoxia:   A) ? CAP    B) COPD  -Presentation consistent with infectious process and Procalcitonin markedly elevated at 26.4 and today down to 18.09 -Influenza panel negative -Continue broad-spectrum antibiotics and supportive care (see below)-narrowed by pulmonary on 2/17 -pulmonary suspects viral etiology superimposed on COPD exacerbation -Urinary strep and Legionella screens were negative -Does  not appear to be consistent with COPD exacerbation so discontinue Solu-Medrol -no wheezing since admit  Mediastinal adenopathy and pulmonary nodule -pulmonary rec OP follow up -could this be reactive related to current infectious process?    Atrial fibrillation with RVR -Seems primarily driven by dehydration with noted increased heart rate with activity and change in position-rates up to 160 beats per minute -Treat underlying causes -wean and dc Cardizem infusion in favor of PO Lopressor -stop B agonist inhalers     CKD (chronic kidney disease), stage III -Note acute renal insufficiency related to dehydration -subtle trend up in Cr likely due to mild ATN -cont IVFs -Follow labs    Hypokalemia/ Dehydration -Follow labs -was given a 500 cc normal saline bolus and started on IV fluids at 100 cc per hour 2/16 - will d/c IVF as 8 L positive balance now    Chronic pain syndrome -Continue home medications as long as not hypotensive    GERD (gastroesophageal reflux disease)    Anemia, chronic disease -Baseline hemoglobin appears to be around 9.5-10.5 -Has dropped to below 8.0 so this could be anemia of critical illness so continue to follow    DVT prophylaxis: SCDs Code Status: Full Family Communication: No family at bedside Disposition Plan/Expected LOS: Transfer to telemetry  Consultants: PCCM  Procedures: None  Antibiotics: Levaquin x1 dose Zosyn 2/15 >>> Vancomycin 2/15 >>>  HPI/Subjective: Currently denies being short of breath or having chest pain. Does feel like she had upper respiratory infection prior to presenting to the emergency department.  Objective: Blood pressure 135/79, pulse 103, temperature 98.7 F (37.1 C), temperature source Oral, resp. rate 26, height 5\' 7"  (1.702 m), weight 233 lb 4 oz (105.8 kg), SpO2 96.00%.  Intake/Output Summary (Last 24 hours)  at 09/05/13 1256 Last data filed at 09/05/13 0800  Gross per 24 hour  Intake 3321.67 ml    Output      0 ml  Net 3321.67 ml   Exam: General: No acute respiratory distress at rest in bedside chair  Lungs: Coarse to auscultation bilaterally without wheezes or crackles, 2 L Cardiovascular: Irregular less tachycardic rate and rhythm without murmur gallop or rub normal S1 and S2, no peripheral edema or JVD Abdomen: Nontender, nondistended, soft, bowel sounds positive, no rebound, no ascites, no appreciable mass Musculoskeletal: No significant cyanosis, clubbing of bilateral lower extremities Neurological: Alert and oriented x 3, moves all extremities x 4 without focal neurological deficits, CN 2-12 intact  Scheduled Meds:  Scheduled Meds: . aspirin EC  81 mg Oral QPM  . atorvastatin  10 mg Oral QHS  . budesonide-formoterol  2 puff Inhalation BID  . calcium carbonate  500 mg of elemental calcium Oral BID WC  . Chlorhexidine Gluconate Cloth  6 each Topical Q0600  . cholecalciferol  400 Units Oral Daily  . docusate sodium  200 mg Oral QHS  . ferrous sulfate  325 mg Oral QPM  . gabapentin  600 mg Oral QID  . guaiFENesin  1,200 mg Oral BID  . metoprolol  25 mg Oral BID  . multivitamin with minerals  1 tablet Oral Daily  . mupirocin ointment  1 application Nasal BID  . pantoprazole  40 mg Oral Daily  . piperacillin-tazobactam (ZOSYN)  IV  3.375 g Intravenous 3 times per day  . vancomycin  1,500 mg Intravenous Q24H  . warfarin  4 mg Oral ONCE-1800  . Warfarin - Pharmacist Dosing Inpatient   Does not apply q1800   Data Reviewed: Basic Metabolic Panel:  Recent Labs Lab 09/03/13 0454 09/03/13 1500 09/04/13 0435 09/05/13 0255  NA 138 140 137 137  K 3.6* 4.0 4.7 4.5  CL 97 102 101 102  CO2 27 22 23 19   GLUCOSE 98 218* 182* 113*  BUN 18 20 25* 31*  CREATININE 1.47* 1.37* 1.32* 1.37*  CALCIUM 8.9 8.3* 8.6 8.5  MG  --  1.9  --   --   PHOS  --  3.1  --   --    Liver Function Tests:  Recent Labs Lab 09/03/13 0454 09/03/13 1500 09/04/13 0435  AST 23 24 25   ALT 12 12  14   ALKPHOS 108 97 85  BILITOT 1.3* 1.2 0.9  PROT 7.0 7.0 6.9  ALBUMIN 3.3* 3.1* 3.1*   CBC:  Recent Labs Lab 09/03/13 0454 09/03/13 1500 09/04/13 0435 09/05/13 0255  WBC 7.8 7.1 7.3 6.0  NEUTROABS 5.3 5.9  --   --   HGB 8.2* 7.9* 7.7* 8.5*  HCT 26.7* 25.2* 25.7* 28.1*  MCV 108.5* 109.1* 109.4* 110.6*  PLT 160 PLATELETS APPEAR ADEQUATE 92* 111*   Cardiac Enzymes:  Recent Labs Lab 09/03/13 0454  TROPONINI <0.30   BNP (last 3 results)  Recent Labs  09/03/13 1500  PROBNP 14053.0*    Recent Results (from the past 240 hour(s))  CULTURE, BLOOD (ROUTINE X 2)     Status: None   Collection Time    09/03/13  5:00 AM      Result Value Ref Range Status   Specimen Description BLOOD ARM LEFT   Final   Special Requests BOTTLES DRAWN AEROBIC AND ANAEROBIC 10CC   Final   Culture  Setup Time     Final   Value: 09/03/2013 13:28  Performed at [facility]     Final   Value:        BLOOD CULTURE RECEIVED NO GROWTH TO DATE CULTURE WILL BE HELD FOR 5 DAYS BEFORE ISSUING A FINAL NEGATIVE REPORT     Performed at [facility]   Report Status PENDING   Incomplete  CULTURE, BLOOD (ROUTINE X 2)     Status: None   Collection Time    09/03/13  5:15 AM      Result Value Ref Range Status   Specimen Description BLOOD ARM RIGHT   Final   Special Requests BOTTLES DRAWN AEROBIC ONLY 5CC   Final   Culture  Setup Time     Final   Value: 09/03/2013 13:29     Performed at [facility]   Culture     Final   Value:        BLOOD CULTURE RECEIVED NO GROWTH TO DATE CULTURE WILL BE HELD FOR 5 DAYS BEFORE ISSUING A FINAL NEGATIVE REPORT     Performed at [facility]   Report Status PENDING   Incomplete  RAPID STREP SCREEN     Status: None   Collection Time    09/03/13  6:31 AM      Result Value Ref Range Status   Streptococcus, Group A Screen (Direct) NEGATIVE  NEGATIVE Final   Comment: (NOTE)     A Rapid Antigen test may result negative if the  antigen level in the     sample is below the detection level of this test. The FDA has not     cleared this test as a stand-alone test therefore the rapid antigen     negative result has reflexed to a Group A Strep culture.  CULTURE, GROUP A STREP     Status: None   Collection Time    09/03/13  6:31 AM      Result Value Ref Range Status   Specimen Description THROAT   Final   Special Requests NONE   Final   Culture     Final   Value: No Beta Hemolytic Streptococci Isolated     Performed at [facility]   Report Status 09/05/2013 FINAL   Final  URINE CULTURE     Status: None   Collection Time    09/03/13  7:43 AM      Result Value Ref Range Status   Specimen Description URINE, CATHETERIZED   Final   Special Requests NONE   Final   Culture  Setup Time     Final   Value: 09/03/2013 18:08     Performed at [place]     Final   Value: NO GROWTH     Performed at [facility]   Culture     Final   Value: NO GROWTH     Performed at [facility]   Report Status 09/04/2013 FINAL   Final  MRSA PCR SCREENING     Status: Abnormal   Collection Time    09/03/13  1:22 PM      Result Value Ref Range Status   MRSA by PCR POSITIVE (*) NEGATIVE Final   Comment:            The GeneXpert MRSA Assay (FDA     approved for NASAL specimens     only), is one component of a     comprehensive MRSA colonization  surveillance program. It is not     intended to diagnose MRSA     infection nor to guide or     monitor treatment for     MRSA infections.     CRITICAL RESULT CALLED TO, READ BACK BY AND VERIFIED WITH:     ROGERS A,SN [id number] 09/03/13 SCALES H  URINE CULTURE     Status: None   Collection Time    09/03/13  2:35 PM      Result Value Ref Range Status   Specimen Description URINE, CLEAN CATCH   Final   Special Requests NONE   Final   Culture  Setup Time     Final   Value: 09/03/2013 20:34     Performed at [place]     Final   Value: NO GROWTH     Performed at [facility]   Culture     Final   Value: NO GROWTH     Performed at [facility]   Report Status 09/04/2013 FINAL   Final     Studies:  Recent x-ray studies have been reviewed in detail by the Attending Physician  Time spent : 40 mins     Erin Hearing, ANP [name] Office  [phone] Pager [phone]  **If unable to reach the above provider after paging please contact the [place] @ [phone]  On-Call/Text Page:      [email or web address]      password [email or web address]  If 7PM-7AM, please contact night-coverage [email or web address] Password [facility] 09/05/2013, 12:56 PM   LOS: 2 days   I have examined the patient, reviewed the chart and modified the above note which I agree with.   Orphia Mctigue,MD [phone] 09/05/2013, 5:39 PM

## 2013-09-05 NOTE — Progress Notes (Signed)
ANTICOAGULATION CONSULT NOTE - Follow Up Consult  Pharmacy Consult for Coumadin Indication: atrial fibrillation  No Known Allergies  Patient Measurements: Height: 5\' 7"  (170.2 cm) Weight: 233 lb 4 oz (105.8 kg) IBW/kg (Calculated) : 61.6  Vital Signs: Temp: 98.6 F (37 C) (02/17 0305) Temp src: Oral (02/17 0305) BP: 133/82 mmHg (02/17 0305)  Labs:  Recent Labs  09/03/13 0454 09/03/13 1500 09/04/13 0435 09/04/13 0950 09/05/13 0255  HGB 8.2* 7.9* 7.7*  --  8.5*  HCT 26.7* 25.2* 25.7*  --  28.1*  PLT 160 PLATELETS APPEAR ADEQUATE 92*  --  111*  APTT 58* 58*  --   --   --   LABPROT 21.2* 22.1*  --  23.5* 25.3*  INR 1.90* 2.00*  --  2.17* 2.39*  CREATININE 1.47* 1.37* 1.32*  --  1.37*  TROPONINI <0.30  --   --   --   --     Estimated Creatinine Clearance: 47.2 ml/min (by C-G formula based on Cr of 1.37).   Medications:  Scheduled:  . aspirin EC  81 mg Oral QPM  . atorvastatin  10 mg Oral QHS  . budesonide-formoterol  2 puff Inhalation BID  . calcium carbonate  500 mg of elemental calcium Oral BID WC  . Chlorhexidine Gluconate Cloth  6 each Topical Q0600  . cholecalciferol  400 Units Oral Daily  . docusate sodium  200 mg Oral QHS  . ferrous sulfate  325 mg Oral QPM  . gabapentin  600 mg Oral QID  . guaiFENesin  1,200 mg Oral BID  . multivitamin with minerals  1 tablet Oral Daily  . mupirocin ointment  1 application Nasal BID  . pantoprazole  40 mg Oral Daily  . piperacillin-tazobactam (ZOSYN)  IV  3.375 g Intravenous 3 times per day  . vancomycin  1,500 mg Intravenous Q24H  . warfarin  4 mg Oral ONCE-1800  . Warfarin - Pharmacist Dosing Inpatient   Does not apply q1800    Assessment: 73 yo F with h/o Afib, CKDIII, COPD, HFpEF presenting on 2/15 with SOB, productive cough, fevers and chills since yesterday. Pharmacy has been consulted to manage her anticoagulation while she is inpatient.  Per patient, home dose is 5 mg daily except 2.5 mg on M,W,F. Last dose  taken on 2/14. Current INR is therapeutic at 2.39 after receiving a dose slightly higher than her home regimen. As INR is trending up, will give a dose slightly higher than her normal regimen. Home regimen will likely be continued tomorrow, pending her INR. Would anticipate new home regimen to be slightly different from what she has been taking, perhaps 2.5 mg Wed/Fri, 5 mg all other days.  CBC: H/H low but stable, plt low, no reported s/s bleeding.   Goal of Therapy:  INR 2-3 Monitor platelets by anticoagulation protocol: Yes   Plan:  -Coumadin 4 mg PO x 1 dose tonight. -Daily INR -Monitor s/s of bleeding  Vijay Durflinger C. Keir Viernes, PharmD Clinical Pharmacist-Resident Pager: [phone] Pharmacy: [phone] 09/05/2013 8:14 AM

## 2013-09-05 NOTE — Progress Notes (Signed)
Pulmonary/Critical Care Progress Note   Name: [name] MRN: [id number] DOB: 06/22/1941    ADMISSION DATE:  09/03/2013 CONSULTATION DATE:  09/03/13  REFERRING MD :  Leisa Lenz, MD PRIMARY SERVICE: PCCM  CHIEF COMPLAINT:  SOB, hypotension.  BRIEF PATIENT DESCRIPTION:  73 years old female with PMH relevant for COPD, CAD, A. Fib on coumadin, diastolic CHF, CKD. Presents with worsening SOB, cough, wheezing, fever and hypotension.   SIGNIFICANT EVENTS / STUDIES:  - CTA: IMPRESSION:  1. No definite acute findings to account for the patient's symptoms.  2. There is mild diffuse bronchial wall thickening with mild  centrilobular and paraseptal emphysema, which is suggestive of  underlying COPD.  3. In addition, there is mediastinal and bilateral hilar  lymphadenopathy. This is nonspecific, but suggestive of a systemic  disease. Given the very mild nodular thickening of portions of the  interlobar fissures in the lungs, this may be related to underlying  sarcoidosis. However, clinical correlation for signs and symptoms of  lymphoproliferative disorder is also recommended.  4. In addition, there is a new nonspecific 5 mm nodule in the  anterior aspect of the right upper lobe (image 17 of series 3).  Given the smoking related changes in the lungs, the patient is at  high risk for bronchogenic carcinoma, and a follow-up chest CT at  6-12 months is recommended. This recommendation follows the  consensus statement: Guidelines for Management of Small Pulmonary  Nodules Detected on CT Scans: A Statement from the [place] as published in Radiology 2005;237:395-400.  5. Atherosclerosis, including left main and 3 vessel coronary artery  disease. Status post median sternotomy for CABG including LIMA to  the LAD.  6. Additional incidental findings, as above.   LINES / TUBES: - Peripheral IV's  CULTURES: - Blood cultures sent - Urine culture sent - Will get sputum  culture  ANTIBIOTICS: - Cefepime  - Vancomycin  SUBJECTIVE: cont  VITAL SIGNS: Temp:  [97 F (36.1 C)-98.7 F (37.1 C)] 98.7 F (37.1 C) (02/17 0751) Resp:  [14-26] 26 (02/17 0820) BP: (112-135)/(53-95) 135/79 mmHg (02/17 0751) SpO2:  [96 %-100 %] 100 % (02/17 0820) Weight:  [105.8 kg (233 lb 4 oz)] 105.8 kg (233 lb 4 oz) (02/17 0500) HEMODYNAMICS:   VENTILATOR SETTINGS:   INTAKE / OUTPUT: Intake/Output     02/16 0701 - 02/17 0700 02/17 0701 - 02/18 0700   P.O. 960    I.V. (mL/kg) 2141 (20.2) 634.2 (6)   IV Piggyback 600 50   Total Intake(mL/kg) 3701 (35) 684.2 (6.5)   Urine (mL/kg/hr)     Total Output       Net +3701 +684.2        Urine Occurrence 3 x    Stool Occurrence 3 x      PHYSICAL EXAMINATION: General: Pleasant female patient in no acute distress. Eyes: Anicteric sclerae. ENT: Oropharynx clear. Moist mucous membranes. No thrush Lymph: No cervical, supraclavicular, or axillary lymphadenopathy. Heart: Normal S1, S2. Slightly tachycardic but improved.  No murmurs, rubs, or gallops appreciated. No bruits, equal pulses. Lungs: Normal excursion, no dullness to percussion. Good air movement bilaterally, mild wheezes bilaterally but no crackles. Normal upper airway sounds without evidence of stridor. Abdomen: Abdomen soft, non-tender and not distended, normoactive bowel sounds. No hepatosplenomegaly or masses. Musculoskeletal: No clubbing or synovitis. Skin: No rashes or lesions Neuro: No focal neurologic deficits.   LABS:  CBC  Recent Labs Lab 09/03/13 1500 09/04/13 0435 09/05/13 0255  WBC 7.1 7.3  6.0  HGB 7.9* 7.7* 8.5*  HCT 25.2* 25.7* 28.1*  PLT PLATELETS APPEAR ADEQUATE 92* 111*   Coag's  Recent Labs Lab 09/03/13 0454 09/03/13 1500 09/04/13 0950 09/05/13 0255  APTT 58* 58*  --   --   INR 1.90* 2.00* 2.17* 2.39*   BMET  Recent Labs Lab 09/03/13 1500 09/04/13 0435 09/05/13 0255  NA 140 137 137  K 4.0 4.7 4.5  CL 102 101 102  CO2  22 23 19   BUN 20 25* 31*  CREATININE 1.37* 1.32* 1.37*  GLUCOSE 218* 182* 113*   Electrolytes  Recent Labs Lab 09/03/13 1500 09/04/13 0435 09/05/13 0255  CALCIUM 8.3* 8.6 8.5  MG 1.9  --   --   PHOS 3.1  --   --    Sepsis Markers  Recent Labs Lab 09/03/13 0540 09/03/13 1500 09/03/13 1650 09/05/13 0255  LATICACIDVEN 2.2  --  2.4* 1.0  PROCALCITON  --  26.40  --  18.09   ABG No results found for this basename: PHART, PCO2ART, PO2ART,  in the last 168 hours Liver Enzymes  Recent Labs Lab 09/03/13 0454 09/03/13 1500 09/04/13 0435  AST 23 24 25   ALT 12 12 14   ALKPHOS 108 97 85  BILITOT 1.3* 1.2 0.9  ALBUMIN 3.3* 3.1* 3.1*   Cardiac Enzymes  Recent Labs Lab 09/03/13 0454 09/03/13 1500  TROPONINI <0.30  --   PROBNP  --  14053.0*   Glucose No results found for this basename: GLUCAP,  in the last 168 hours  Imaging No results found.   CXR:  No evidence of parenchymal lung disease.   ASSESSMENT / PLAN:  PULMONARY A: 1) Hypoxemia, likely secondary to COPD exacerbation. Possibly in the setting of viral infection. No pneumonic infiltrate on CT. No evidence of CHF on CT 2) Mediastinal adenopathy and pulmonary nodule. Will need follow up as outpatient. P:   - Supplemental O2 to keep O2 sat > 92% - RVP negative. - D/c Solumedrol. - Continue symbicort and duonebs - Will change Zosyn, vancomycin to levaquin in AM if continues to do well. - She can go back to Levaquin if quick improvement of BP and lactic acidosis and negative cultures.   CARDIOVASCULAR A:  1) Sepsis. Good response to IVF 2) Mild lactic acidosis 3) Elevated BNP. No clinical evidence of fluid overload on exam or chest CT. 4) Chronic A. Fib on coumadin P:  - IVF resuscitation complete, would not give additional fluid at this time. - Antibiotics as above. - Continue coumadin. - Will hold home antihypertensives until more stable from a hemodynamic standpoint. - Rate control effective  with lopressor at 25 BID.  RENAL A:   1) Mild acute on chronic renal failure? P:   - Would KVO IVF at this point. - Will follow BMP. - Replace electrolytes as needed.  GASTROINTESTINAL A:   1) No acute issues 2) GERD P:   - Home protonix. - Heart healthy diet.  HEMATOLOGIC A:   1) Anemia, Chronic disease? No evidence of acute bleeding P:  - Will follow CBC  INFECTIOUS A:   1) No clear source of sepsis. RVP negative P:   - Will follow cultures - Continue vanc/zosyn today (day 3) if WBC and fever are under control by AM and cultures are negative then would switch to PO levaquin.  ENDOCRINE A:   1) No issues   NEUROLOGIC A:  1) No acute issues P:   - Continue home medications  TODAY'S SUMMARY:  May transfer to SDU, abx plan as above, avoid volume overload, HR control as above.  Will continue to follow with you.  I have personally obtained a history, examined the patient, evaluated laboratory and imaging results, formulated the assessment and plan and placed orders.  Rush Farmer, M.D. [facility] Pulmonary/Critical Care Medicine. Pager: [phone]. After hours pager: [phone].

## 2013-09-05 NOTE — Progress Notes (Signed)
Physical Therapy Treatment Patient Details Name: [name] MRN: [id number] DOB: [birth year] Today's Date: 09/05/2013 Time: 5053-9767 PT Time Calculation (min): 30 min  PT Assessment / Plan / Recommendation  History of Present Illness 73 year old female with past medical history of atrial fibrillation on coumadin, CKD stage III, HTN, COPD, chronic diastolic CHF who presented to [facility] ED 09/03/2013 with complaints of worsening shortness of breath, associated cough productive of thick green sputum, fevers and chillCT chest with contrast revealed likely COPD, mediastinal and bilateral hilar lymphadenopathy, possible underlying sarcoidosis; there was also a new nonspecific 5 mm nodule in the anterior aspect of the right upper lobe which requires follow up in 6-12 months to re-evaluate if this is malignant lesion such as bronchogenic carcinomas   PT Comments   Pt with decreased HR with transfers today but gait still limited due to HR up to 158 with in room ambulation. HR 115 with bil LE HEP and 99 at rest. Sats 91-96% on RA with activity and 98% on 1L. Pt encouraged to continue mobility with nursing assist and educated for bil LE HEP. Will follow.   Follow Up Recommendations  No PT follow up     Does the patient have the potential to tolerate intense rehabilitation     Barriers to Discharge        Equipment Recommendations       Recommendations for Other Services    Frequency     Progress towards PT Goals Progress towards PT goals: Progressing toward goals  Plan Current plan remains appropriate    Precautions / Restrictions Precautions Precautions: Other (comment) Precaution Comments: watch HR   Pertinent Vitals/Pain No pain BP 135/79 end of session    Mobility  Bed Mobility Overal bed mobility: Modified Independent Transfers Overall transfer level: Modified independent Ambulation/Gait Ambulation/Gait assistance: Supervision Ambulation Distance (Feet): 30 Feet Assistive  device: None Gait Pattern/deviations: Step-through pattern;WFL(Within Functional Limits) General Gait Details: limited ambulation due to HR with supervision for lines    Exercises General Exercises - Lower Extremity Long Arc Quad: AROM;Seated;Both;15 reps Hip Flexion/Marching: AROM;Seated;Both;15 reps Toe Raises: AROM;Seated;Both;15 reps Heel Raises: AROM;Seated;Both;15 reps   PT Diagnosis:    PT Problem List:   PT Treatment Interventions:     PT Goals (current goals can now be found in the care plan section)    Visit Information  Last PT Received On: 09/05/13 Assistance Needed: +1 History of Present Illness: 73 year old female with past medical history of atrial fibrillation on coumadin, CKD stage III, HTN, COPD, chronic diastolic CHF who presented to [facility] ED 09/03/2013 with complaints of worsening shortness of breath, associated cough productive of thick green sputum, fevers and chillCT chest with contrast revealed likely COPD, mediastinal and bilateral hilar lymphadenopathy, possible underlying sarcoidosis; there was also a new nonspecific 5 mm nodule in the anterior aspect of the right upper lobe which requires follow up in 6-12 months to re-evaluate if this is malignant lesion such as bronchogenic carcinomas    Subjective Data      Cognition  Cognition Arousal/Alertness: Awake/alert Behavior During Therapy: WFL for tasks assessed/performed Overall Cognitive Status: Within Functional Limits for tasks assessed    Balance     End of Session PT - End of Session Activity Tolerance: Treatment limited secondary to medical complications (Comment) Patient left: in chair;with call bell/phone within reach Nurse Communication: Mobility status   GP     Melford Aase 09/05/2013, 8:41 AM Elwyn Reach, [place]

## 2013-09-06 ENCOUNTER — Inpatient Hospital Stay (HOSPITAL_COMMUNITY): Payer: Medicare Other

## 2013-09-06 DIAGNOSIS — A419 Sepsis, unspecified organism: Secondary | ICD-10-CM

## 2013-09-06 LAB — CBC
HEMATOCRIT: 27.8 % — AB (ref 36.0–46.0)
HEMOGLOBIN: 8.4 g/dL — AB (ref 12.0–15.0)
MCH: 33.3 pg (ref 26.0–34.0)
MCHC: 30.2 g/dL (ref 30.0–36.0)
MCV: 110.3 fL — AB (ref 78.0–100.0)
Platelets: 116 10*3/uL — ABNORMAL LOW (ref 150–400)
RBC: 2.52 MIL/uL — AB (ref 3.87–5.11)
RDW: 21.8 % — AB (ref 11.5–15.5)
WBC: 3.4 10*3/uL — AB (ref 4.0–10.5)

## 2013-09-06 LAB — BASIC METABOLIC PANEL
BUN: 29 mg/dL — AB (ref 6–23)
CO2: 24 meq/L (ref 19–32)
CREATININE: 1.45 mg/dL — AB (ref 0.50–1.10)
Calcium: 8.6 mg/dL (ref 8.4–10.5)
Chloride: 104 mEq/L (ref 96–112)
GFR calc Af Amer: 41 mL/min — ABNORMAL LOW (ref >90)
GFR calc non Af Amer: 35 mL/min — ABNORMAL LOW (ref >90)
GLUCOSE: 83 mg/dL (ref 70–99)
POTASSIUM: 4.2 meq/L (ref 3.7–5.3)
Sodium: 138 mEq/L (ref 137–147)

## 2013-09-06 LAB — GLUCOSE, CAPILLARY: Glucose-Capillary: 75 mg/dL (ref 70–99)

## 2013-09-06 LAB — PROTIME-INR
INR: 2.36 — AB (ref 0.00–1.49)
Prothrombin Time: 25 seconds — ABNORMAL HIGH (ref 11.6–15.2)

## 2013-09-06 MED ORDER — WARFARIN SODIUM 2.5 MG PO TABS
2.5000 mg | ORAL_TABLET | Freq: Once | ORAL | Status: AC
  Administered 2013-09-06: 2.5 mg via ORAL
  Filled 2013-09-06: qty 1

## 2013-09-06 MED ORDER — LEVOFLOXACIN 750 MG PO TABS
750.0000 mg | ORAL_TABLET | Freq: Every day | ORAL | Status: DC
  Administered 2013-09-06 – 2013-09-07 (×2): 750 mg via ORAL
  Filled 2013-09-06 (×2): qty 1

## 2013-09-06 NOTE — ED Provider Notes (Addendum)
CSN: [id number]     Arrival date & time 09/03/13  0430 History   First MD Initiated Contact with Patient 09/03/13 0440     Chief Complaint  Patient presents with  . Altered Mental Status     (Consider location/radiation/quality/duration/timing/severity/associated sxs/prior Treatment) HPI Comments: 73 years old female with PMH of COPD, CAD, A. Fib on coumadin, diastolic CHF, CKD. Presents with worsening SOB, cough, wheezing, fever and hypotension.  Pt's husband called EMS as she was appearing confused. [facility], when they arrived patient was hypoxic, with O2 sats at 78%, improved with home O2. Pt is being seen by Pulm for her hypoxia, but no firm dx has been established. She is feeling a lot comfortable now. Pt has no hx of PE, DVt and no hx of same. She has a cough. She has been on antibiotics for presumed antibiotics recently. Pt has no chest pain, no uti like sx, and denies any orthopnea, PND.  Patient is a 73 y.o. female presenting with altered mental status. The history is provided by the patient and medical records.  Altered Mental Status Associated symptoms: no abdominal pain, no fever, no headaches, no nausea, no rash and no vomiting     Past Medical History  Diagnosis Date  . Coronary atherosclerosis of unspecified type of vessel, native or graft   . Atrial fibrillation 01/19/2012    stress test - non-gated secondary to A fib,  normal myocaridal perfusion study  . Unspecified venous (peripheral) insufficiency   . Other and unspecified hyperlipidemia   . Morbid obesity   . Esophageal reflux   . Irritable bowel syndrome   . Other symptoms involving urinary system(788.99)   . Chronic pain syndrome   . 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 Anxiety state, unspecified   . Herpes zoster without mention of complication   . Herpes zoster with other nervous system complications(053.19)   . Lumbago   . Anticoagulated on Coumadin, chronic for A. Fib 04/08/2012  . COPD  (chronic obstructive pulmonary disease)   . 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 Pneumonia 4 years ago  . Bronchitis, chronic   . Fibromyalgia   . Arthritis   . CAD (coronary artery disease) 04/29/2006    echo -EF 45-50%, impaired LV relaxation  . 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 GI bleed 04/07/2012    endoscopy/colonoscopy unremarkable  . 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 DJD (degenerative joint disease)   . Myocardial infarction   . Anemia    Past Surgical History  Procedure Laterality Date  . Vesicovaginal fistula closure w/ tah    . Cardiac catheterization  01/06/2010    RCA mid artery stenosis at 99%, LAD proximal occlusion 90%  at origin of ramus  . Nissen fundoplication    . Esophagogastroduodenoscopy  04/09/2012    Procedure: ESOPHAGOGASTRODUODENOSCOPY (EGD);  Surgeon: Beryle Beams, MD;  Location: [facility] ENDOSCOPY;  Service: Endoscopy;  Laterality: N/A;  tentatively scheduled per Trula Slade due to patients breathing problems  . Colonoscopy  04/11/2012    Procedure: COLONOSCOPY;  Surgeon: Ladene Artist, MD,FACG;  Location: [facility] ENDOSCOPY;  Service: Endoscopy;  Laterality: N/A;  . Abdominal hysterectomy  at 73 years old  . Appendectomy  about 50 years ago  . Coronary artery bypass graft  1997    LIMA to LAD, SVG to ramus, SVG to RCA  . Cholecystectomy  50 years  ago  . Neck surgery  73 years old    full movement  . Right knee arthroscopy  3 years ago  . Eye surgery  2005    cataracts both eyes  . Back surgery      x5, "birdcage" and fused in lower back  . Total knee arthroplasty  06/28/2012    Procedure: TOTAL KNEE ARTHROPLASTY;  Surgeon: Sydnee Cabal, MD;  Location: [facility] ORS;  Service: Orthopedics;  Laterality: Left;  . Joint replacement Left 06/28/12    knee replacement   Family History  Problem Relation Age of Onset  . Heart disease Mother   . Lung disease Father   . Kidney disease      History  Substance Use Topics  . Smoking status: Former Smoker -- 1.00 packs/day for 22 years    Types: Cigarettes    Quit date: 07/20/1984  . Smokeless tobacco: Never Used  . Alcohol Use: No   OB History   Grav Para Term Preterm Abortions TAB SAB Ect Mult Living                 Review of Systems  Constitutional: Positive for activity change. Negative for fever.  Respiratory: Positive for cough and shortness of breath.   Cardiovascular: Negative for chest pain.  Gastrointestinal: Negative for nausea, vomiting and abdominal pain.  Genitourinary: Negative for dysuria.  Musculoskeletal: Negative for neck pain.  Skin: Negative for rash.  Neurological: Negative for headaches.  All other systems reviewed and are negative.      Allergies  Review of patient's allergies indicates no known allergies.  Home Medications  No current outpatient prescriptions on file. BP 116/55  Pulse 85  Temp(Src) 98.2 F (36.8 C) (Oral)  Resp 16  Ht 5\' 7"  (1.702 m)  Wt 240 lb (108.863 kg)  BMI 37.58 kg/m2  SpO2 100% Physical Exam  Nursing note and vitals reviewed. Constitutional: She is oriented to person, place, and time. She appears well-developed and well-nourished.  HENT:  Head: Normocephalic and atraumatic.  Eyes: EOM are normal. Pupils are equal, round, and reactive to light.  Neck: Neck supple. No JVD present.  Cardiovascular: Normal rate, regular rhythm and normal heart sounds.   No murmur heard. Pulmonary/Chest: Effort normal. No respiratory distress. She has wheezes.  Poor air entry diffusely  Abdominal: Soft. She exhibits no distension. There is no tenderness. There is no rebound and no guarding.  Musculoskeletal: She exhibits no edema.  Neurological: She is alert and oriented to person, place, and time.  Skin: Skin is warm and dry.    ED Course  Procedures (including critical care time) Labs Review Labs Reviewed  MRSA PCR SCREENING - Abnormal; Notable for the  following:    MRSA by PCR POSITIVE (*)    All other components within normal limits  CBC WITH DIFFERENTIAL - Abnormal; Notable for the following:    RBC 2.46 (*)    Hemoglobin 8.2 (*)    HCT 26.7 (*)    MCV 108.5 (*)    RDW 22.1 (*)    Monocytes Relative 14 (*)    Monocytes Absolute 1.1 (*)    All other components within normal limits  COMPREHENSIVE METABOLIC PANEL - Abnormal; Notable for the following:    Potassium 3.6 (*)    Creatinine, Ser 1.47 (*)    Albumin 3.3 (*)    Total Bilirubin 1.3 (*)    GFR calc non Af Amer 35 (*)    GFR calc Af Amer 40 (*)  All other components within normal limits  PROTIME-INR - Abnormal; Notable for the following:    Prothrombin Time 21.2 (*)    INR 1.90 (*)    All other components within normal limits  APTT - Abnormal; Notable for the following:    aPTT 58 (*)    All other components within normal limits  PRO B NATRIURETIC PEPTIDE - Abnormal; Notable for the following:    Pro B Natriuretic peptide (BNP) 14053.0 (*)    All other components within normal limits  COMPREHENSIVE METABOLIC PANEL - Abnormal; Notable for the following:    Glucose, Bld 218 (*)    Creatinine, Ser 1.37 (*)    Calcium 8.3 (*)    Albumin 3.1 (*)    GFR calc non Af Amer 38 (*)    GFR calc Af Amer 44 (*)    All other components within normal limits  CBC WITH DIFFERENTIAL - Abnormal; Notable for the following:    RBC 2.31 (*)    Hemoglobin 7.9 (*)    HCT 25.2 (*)    MCV 109.1 (*)    MCH 34.2 (*)    RDW 22.2 (*)    Neutrophils Relative % 83 (*)    Lymphocytes Relative 9 (*)    Lymphs Abs 0.6 (*)    All other components within normal limits  APTT - Abnormal; Notable for the following:    aPTT 58 (*)    All other components within normal limits  PROTIME-INR - Abnormal; Notable for the following:    Prothrombin Time 22.1 (*)    INR 2.00 (*)    All other components within normal limits  LACTIC ACID, PLASMA - Abnormal; Notable for the following:    Lactic Acid,  Venous 2.4 (*)    All other components within normal limits  COMPREHENSIVE METABOLIC PANEL - Abnormal; Notable for the following:    Glucose, Bld 182 (*)    BUN 25 (*)    Creatinine, Ser 1.32 (*)    Albumin 3.1 (*)    GFR calc non Af Amer 40 (*)    GFR calc Af Amer 46 (*)    All other components within normal limits  CBC - Abnormal; Notable for the following:    RBC 2.35 (*)    Hemoglobin 7.7 (*)    HCT 25.7 (*)    MCV 109.4 (*)    RDW 21.8 (*)    Platelets 92 (*)    All other components within normal limits  PROTIME-INR - Abnormal; Notable for the following:    Prothrombin Time 23.5 (*)    INR 2.17 (*)    All other components within normal limits  PROTIME-INR - Abnormal; Notable for the following:    Prothrombin Time 25.3 (*)    INR 2.39 (*)    All other components within normal limits  BASIC METABOLIC PANEL - Abnormal; Notable for the following:    Glucose, Bld 113 (*)    BUN 31 (*)    Creatinine, Ser 1.37 (*)    GFR calc non Af Amer 38 (*)    GFR calc Af Amer 44 (*)    All other components within normal limits  CBC - Abnormal; Notable for the following:    RBC 2.54 (*)    Hemoglobin 8.5 (*)    HCT 28.1 (*)    MCV 110.6 (*)    RDW 21.9 (*)    Platelets 111 (*)    All other components within normal limits  PROTIME-INR - Abnormal; Notable for the following:    Prothrombin Time 25.0 (*)    INR 2.36 (*)    All other components within normal limits  CBC - Abnormal; Notable for the following:    WBC 3.4 (*)    RBC 2.52 (*)    Hemoglobin 8.4 (*)    HCT 27.8 (*)    MCV 110.3 (*)    RDW 21.8 (*)    Platelets 116 (*)    All other components within normal limits  BASIC METABOLIC PANEL - Abnormal; Notable for the following:    BUN 29 (*)    Creatinine, Ser 1.45 (*)    GFR calc non Af Amer 35 (*)    GFR calc Af Amer 41 (*)    All other components within normal limits  POCT I-STAT 3, BLOOD GAS (G3P V) - Abnormal; Notable for the following:    pH, Ven 7.427 (*)     pCO2, Ven 43.4 (*)    pO2, Ven 24.0 (*)    Bicarbonate 28.6 (*)    Acid-Base Excess 4.0 (*)    All other components within normal limits  URINE CULTURE  CULTURE, BLOOD (ROUTINE X 2)  CULTURE, BLOOD (ROUTINE X 2)  RAPID STREP SCREEN  CULTURE, GROUP A STREP  URINE CULTURE  CULTURE, EXPECTORATED SPUTUM-ASSESSMENT  TROPONIN I  URINALYSIS, ROUTINE W REFLEX MICROSCOPIC  LACTIC ACID, PLASMA  LEGIONELLA ANTIGEN, URINE  STREP PNEUMONIAE URINARY ANTIGEN  INFLUENZA PANEL BY PCR (TYPE A & B, H1N1)  PROCALCITONIN  MAGNESIUM  PHOSPHORUS  TSH  CORTISOL  LACTIC ACID, PLASMA  PROCALCITONIN  GLUCOSE, CAPILLARY  CG4 I-STAT (LACTIC ACID)   Imaging Review No results found.    MDM   Final diagnoses:  Respiratory failure with hypoxia  CAP (community acquired pneumonia)  Atrial fibrillation with RVR     Date: 09/06/2013  Rate: 140  Rhythm: atrial fibrillation  QRS Axis: normal  Intervals: normal  ST/T Wave abnormalities: nonspecific ST/T changes  Conduction Disutrbances:none  Narrative Interpretation:   Old EKG Reviewed: RVR is new.  DDx: Sepsis syndrome ACS syndrome CHF exacerbation COPD exacerbation Infection - pneumonia/UTI/Cellulitis PE Dehydration Electrolyte abnormality Tox syndrome  Pt comes in with cc of AMS. Was noted to be in resp distress and hypoxia per EMS, and started on O2. She arrives to the ED, tachycardic, in afib with RVR, but in no real resp distress and able to complete full sentences w/o problems. O2 sats improved with supplemental O2 per nasal canula.  Her exam doesn't show CHF. CXR shows no infiltrate. No WBC elevation, No fever at arrival. Recent flu swab was neg. However, given the decompensation in resp status - and her cough with dib, we did cover her for CAP, and gave levaquin. PE less likely - given she is already on coumadin. Her RVR is likely rom the resp decompensation, or possible sepsis - so we will not agressively rate control, since she  is maintaing her BP and mentation well. Clinically, no CHF exacerbation. IVF started.  Pt was placed for admission by the [facility]. Repeat BP is low - but she is still looking the same clinically. She had her BP checked again - and it was over 90 mmhg again. Pt is stable for admission to step down. O2 sats still well with nasal canula and no resp distress.        Varney Biles, MD 09/06/13 [phone]  CRITICAL CARE Performed by: Varney Biles   Total critical care time: 40  minutes - hypoxic resp failure  Critical care time was exclusive of separately billable procedures and treating other patients.  Critical care was necessary to treat or prevent imminent or life-threatening deterioration.  Critical care was time spent personally by me on the following activities: development of treatment plan with patient and/or surrogate as well as nursing, discussions with consultants, evaluation of patient's response to treatment, examination of patient, obtaining history from patient or surrogate, ordering and performing treatments and interventions, ordering and review of laboratory studies, ordering and review of radiographic studies, pulse oximetry and re-evaluation of patient's condition.   Varney Biles, MD 09/06/13 [phone]

## 2013-09-06 NOTE — Progress Notes (Signed)
Pt's hr up to 150s nonsustained. Pt in the restroom states this happens whenever she strains. MD on call L. Montebello notified & came to see the pt. EKG obtained & in chart. New orders to give the pt her 25mg  of PO lopressor early. Pt currently still in a-fib heart rate in low 100s. Will continue to monitor the pt. Hoover Brunette, RN

## 2013-09-06 NOTE — Progress Notes (Addendum)
No distress. No new complaints. Wishes to go home   Filed Vitals:   09/05/13 2005 09/05/13 2137 09/06/13 0617 09/06/13 1348  BP: 125/71  116/55 135/97  Pulse: 113  85 110  Temp: 98.4 F (36.9 C)  98.2 F (36.8 C) 98.1 F (36.7 C)  TempSrc: Oral  Oral Oral  Resp:      Height:      Weight:   108.863 kg (240 lb)   SpO2: 100% 99% 100% 100%   NAD No JVD HEENT WNL Chest clear without wheezes or consolidation No murmur NABS No edema   I have reviewed all of today's lab results. Relevant abnormalities are discussed in the A/P section   IMP Resolving severe sepsis - thought to be of pulmonary origin but initial CXR clear   PLAN: Change abx to PO Levofloxacin Check CXR today - I will review after completion and offer any further recs OK for discharge to home any time from pulmonary perspective  PCCM will sign off. Please call if we can be of further assistance  Merton Border, MD ; [facility] [phone].  After 5:30 PM or weekends, call [phone]  ADD: CXR reveals no infiltrates. PCT elevated but coming down. If this was sepsis, it is not likely to have been of pulmonary source. Would empirically complete 5-7 days of abx total  Merton Border, MD ; [facility] [phone].  After 5:30 PM or weekends, call [phone]

## 2013-09-06 NOTE — Progress Notes (Signed)
Occupational Therapy Treatment Patient Details Name: Clarabell Matsuoka MRN: [id number] DOB: 28-Feb-1941 Today's Date: 09/06/2013 Time: 4132-4401 OT Time Calculation (min): 26 min  OT Assessment / Plan / Recommendation  History of present illness 73 year old female with past medical history of atrial fibrillation on coumadin, CKD stage III, HTN, COPD, chronic diastolic CHF who presented to [facility] ED 09/03/2013 with complaints of worsening shortness of breath, associated cough productive of thick green sputum, fevers and chillCT chest with contrast revealed likely COPD, mediastinal and bilateral hilar lymphadenopathy, possible underlying sarcoidosis; there was also a new nonspecific 5 mm nodule in the anterior aspect of the right upper lobe which requires follow up in 6-12 months to re-evaluate if this is malignant lesion such as bronchogenic carcinomas   OT comments  Pt making excllent progress. Completed education regarding E conservation and home safety. Pt with increased HR to 124 with increased activity. Encourage pt to ambulate with staff. Ready for d/C home wen medically stable.  Follow Up Recommendations  No OT follow up;Supervision - Intermittent    Barriers to Discharge       Equipment Recommendations  None recommended by OT    Recommendations for Other Services    Frequency Min 2X/week   Progress towards OT Goals Progress towards OT goals: Goals met/education completed, patient discharged from [facility] Discharge plan remains appropriate    Precautions / Restrictions Precautions Precautions: Other (comment) Precaution Comments: watch HR   Pertinent Vitals/Pain HR 124    ADL  Lower Body Dressing: Modified independent Where Assessed - Lower Body Dressing: Unsupported sit to stand Transfers/Ambulation Related to ADLs: mod i ADL Comments: incresed moblity with HR increase to 124. dyspnea 2/4. Educated pt on E conservation techniques    OT Diagnosis:    OT Problem List:   OT  Treatment Interventions:     OT Goals(current goals can now be found in the care plan section) Acute Rehab OT Goals Patient Stated Goal: To go home OT Goal Formulation: With patient Time For Goal Achievement: 09/11/13 Potential to Achieve Goals: Good ADL Goals Pt Will Perform Grooming: with modified independence;standing Pt Will Perform Upper Body Bathing: with modified independence;sitting Pt Will Perform Lower Body Bathing: with modified independence;sit to/from stand Pt Will Perform Upper Body Dressing: with modified independence;sitting Pt Will Perform Lower Body Dressing: with modified independence;sit to/from stand Pt Will Transfer to Toilet: with modified independence;ambulating;regular height toilet;grab bars Pt Will Perform Toileting - Clothing Manipulation and hygiene: with modified independence;sit to/from stand Pt Will Perform Tub/Shower Transfer: Tub transfer;with modified independence;ambulating;shower seat  Visit Information  Last OT Received On: 09/06/13 History of Present Illness: 73 year old female with past medical history of atrial fibrillation on coumadin, CKD stage III, HTN, COPD, chronic diastolic CHF who presented to [facility] ED 09/03/2013 with complaints of worsening shortness of breath, associated cough productive of thick green sputum, fevers and chillCT chest with contrast revealed likely COPD, mediastinal and bilateral hilar lymphadenopathy, possible underlying sarcoidosis; there was also a new nonspecific 5 mm nodule in the anterior aspect of the right upper lobe which requires follow up in 6-12 months to re-evaluate if this is malignant lesion such as bronchogenic carcinomas    Subjective Data      Prior Functioning       Cognition  Cognition Arousal/Alertness: Awake/alert Behavior During Therapy: WFL for tasks assessed/performed Overall Cognitive Status: Within Functional Limits for tasks assessed    Mobility  Bed Mobility Overal bed mobility: Modified  Independent Transfers Overall  transfer level: Modified independent    Exercises      Balance    End of Session OT - End of Session Equipment Utilized During Treatment: Gait belt;Rolling walker Activity Tolerance: Patient tolerated treatment well Patient left: in chair;with call bell/phone within reach Nurse Communication: Mobility status  GO     Kenroy Timberman,HILLARY 09/06/2013, 4:34 PM [facility], OTR/L  [phone] 09/06/2013

## 2013-09-06 NOTE — Progress Notes (Addendum)
Progress Note  [name] AJG:[id number] DOB: [birth year] DOA: 09/03/2013 PCP: Michele Mcalpine, MD  HPI/Subjective: Feels much better, started to cough up some sputum since yesterday. Still have some shortness of breath and cough with sputum production.   Brief narrative: 73 year old female patient with chronic atrial fibrillation on Coumadin, chronic kidney disease stage III, hypertension and COPD. Also has chronic diastolic heart failure and is on schedule diuretics. She presented to the emergency department with increasing shortness of breath associated with productive cough of thick green sputum with chills and fevers. Patient reported sudden onset of symptoms 24 hours prior to admission. She did endorse pleuritic chest pain with coughing. In emergency department her blood pressure was low at 84/66 with a heart rate of 133 and a temp of 102.8. She was maintaining O2 saturations of 96% on 2 L of oxygen. Hemoglobin 8.2 INR 1.9. Chest x-ray was without acute findings. CT of the chest with contrast revealed COPD possible underlying sarcoidosis and a nonspecific 5 mm nodule anterior aspect right upper lobe. In the ER she was given multiple nebulizer treatments as well as steroids without any improvement in her symptoms. Because of fever and suspicion for pneumonia she was started on broad-spectrum antibiotics.  Assessment/Plan:    Sepsis -Positive Procalcitonin and presentation with high fever, tachycardia and hypotension -Source appears to be pulmonary -Currently hemodynamically improved without hypotension but BP soft in setting of recurrent RVR -lactic acidosis has resolved    Acute respiratory failure with hypoxia:   A) ? CAP    B) COPD  -Presentation consistent with infectious process and Procalcitonin markedly elevated at 26.4 and today down to 18.09 -Influenza panel negative -Continue broad-spectrum antibiotics and supportive care (see below)-narrowed by pulmonary on  2/17 -pulmonary suspects viral etiology superimposed on COPD exacerbation -Urinary strep and Legionella screens were negative -Does not appear to be consistent with COPD exacerbation so discontinue Solu-Medrol -no wheezing since admit  Mediastinal adenopathy and pulmonary nodule -pulmonary rec OP follow up -Mediastinal adenopathy is likely reactive to current infection process.    Atrial fibrillation with RVR -Seems primarily driven by dehydration with noted increased heart rate with activity and change in position-rates up to 160 beats per minute -Treat underlying causes -wean and dc Cardizem infusion in favor of PO Lopressor -stop B agonist inhalers     CKD (chronic kidney disease), stage III -Note acute renal insufficiency related to dehydration -subtle trend up in Cr likely due to mild ATN -cont IVFs -Follow labs    Hypokalemia/ Dehydration -Follow labs -was given a 500 cc normal saline bolus and started on IV fluids at 100 cc per hour 2/16 - will d/c IVF as 8 L positive balance now    Chronic pain syndrome -Continue home medications as long as not hypotensive    GERD (gastroesophageal reflux disease)    Anemia, chronic disease -Baseline hemoglobin appears to be around 9.5-10.5 -Has dropped to below 8.0 so this could be anemia of critical illness so continue to follow. -Patient has chronic anemia secondary to CKD stage III.    DVT prophylaxis: SCDs Code Status: Full Family Communication: No family at bedside Disposition Plan/Expected LOS: Transfer to telemetry  Consultants: PCCM  Procedures: None  Antibiotics: Levaquin x1 dose Zosyn 2/15 >>> Vancomycin 2/15 >>>   Objective: Blood pressure 116/55, pulse 85, temperature 98.2 F (36.8 C), temperature source Oral, resp. rate 16, height 5\' 7"  (1.702 m), weight 108.863 kg (240 lb), SpO2 100.00%.  Intake/Output Summary (Last 24 hours) at  09/06/13 1337 Last data filed at 09/06/13 9024  Gross per 24 hour   Intake    360 ml  Output      0 ml  Net    360 ml   Exam: General: No acute respiratory distress at rest in bedside chair  Lungs: Coarse to auscultation bilaterally without wheezes or crackles, 2 L Cardiovascular: Irregular less tachycardic rate and rhythm without murmur gallop or rub normal S1 and S2, no peripheral edema or JVD Abdomen: Nontender, nondistended, soft, bowel sounds positive, no rebound, no ascites, no appreciable mass Musculoskeletal: No significant cyanosis, clubbing of bilateral lower extremities Neurological: Alert and oriented x 3, moves all extremities x 4 without focal neurological deficits, CN 2-12 intact  Scheduled Meds:  Scheduled Meds: . aspirin EC  81 mg Oral QPM  . atorvastatin  10 mg Oral QHS  . budesonide-formoterol  2 puff Inhalation BID  . calcium carbonate  500 mg of elemental calcium Oral BID WC  . Chlorhexidine Gluconate Cloth  6 each Topical Q0600  . cholecalciferol  400 Units Oral Daily  . docusate sodium  200 mg Oral QHS  . ferrous sulfate  325 mg Oral QPM  . gabapentin  600 mg Oral QID  . guaiFENesin  1,200 mg Oral BID  . metoprolol  25 mg Oral BID  . multivitamin with minerals  1 tablet Oral Daily  . mupirocin ointment  1 application Nasal BID  . pantoprazole  40 mg Oral Daily  . piperacillin-tazobactam (ZOSYN)  IV  3.375 g Intravenous 3 times per day  . vancomycin  1,500 mg Intravenous Q24H  . warfarin  2.5 mg Oral ONCE-1800  . Warfarin - Pharmacist Dosing Inpatient   Does not apply q1800   Data Reviewed: Basic Metabolic Panel:  Recent Labs Lab 09/03/13 0454 09/03/13 1500 09/04/13 0435 09/05/13 0255 09/06/13 0630  NA 138 140 137 137 138  K 3.6* 4.0 4.7 4.5 4.2  CL 97 102 101 102 104  CO2 27 22 23 19 24   GLUCOSE 98 218* 182* 113* 83  BUN 18 20 25* 31* 29*  CREATININE 1.47* 1.37* 1.32* 1.37* 1.45*  CALCIUM 8.9 8.3* 8.6 8.5 8.6  MG  --  1.9  --   --   --   PHOS  --  3.1  --   --   --    Liver Function Tests:  Recent  Labs Lab 09/03/13 0454 09/03/13 1500 09/04/13 0435  AST 23 24 25   ALT 12 12 14   ALKPHOS 108 97 85  BILITOT 1.3* 1.2 0.9  PROT 7.0 7.0 6.9  ALBUMIN 3.3* 3.1* 3.1*   CBC:  Recent Labs Lab 09/03/13 0454 09/03/13 1500 09/04/13 0435 09/05/13 0255 09/06/13 0630  WBC 7.8 7.1 7.3 6.0 3.4*  NEUTROABS 5.3 5.9  --   --   --   HGB 8.2* 7.9* 7.7* 8.5* 8.4*  HCT 26.7* 25.2* 25.7* 28.1* 27.8*  MCV 108.5* 109.1* 109.4* 110.6* 110.3*  PLT 160 PLATELETS APPEAR ADEQUATE 92* 111* 116*   Cardiac Enzymes:  Recent Labs Lab 09/03/13 0454  TROPONINI <0.30   BNP (last 3 results)  Recent Labs  09/03/13 1500  PROBNP 14053.0*    Recent Results (from the past 240 hour(s))  CULTURE, BLOOD (ROUTINE X 2)     Status: None   Collection Time    09/03/13  5:00 AM      Result Value Ref Range Status   Specimen Description BLOOD ARM LEFT   Final  Special Requests BOTTLES DRAWN AEROBIC AND ANAEROBIC 10CC   Final   Culture  Setup Time     Final   Value: 09/03/2013 13:28     Performed at [facility]   Culture     Final   Value:        BLOOD CULTURE RECEIVED NO GROWTH TO DATE CULTURE WILL BE HELD FOR 5 DAYS BEFORE ISSUING A FINAL NEGATIVE REPORT     Performed at [facility]   Report Status PENDING   Incomplete  CULTURE, BLOOD (ROUTINE X 2)     Status: None   Collection Time    09/03/13  5:15 AM      Result Value Ref Range Status   Specimen Description BLOOD ARM RIGHT   Final   Special Requests BOTTLES DRAWN AEROBIC ONLY 5CC   Final   Culture  Setup Time     Final   Value: 09/03/2013 13:29     Performed at [facility]   Culture     Final   Value:        BLOOD CULTURE RECEIVED NO GROWTH TO DATE CULTURE WILL BE HELD FOR 5 DAYS BEFORE ISSUING A FINAL NEGATIVE REPORT     Performed at [facility]   Report Status PENDING   Incomplete  RAPID STREP SCREEN     Status: None   Collection Time    09/03/13  6:31 AM      Result Value Ref Range Status    Streptococcus, Group A Screen (Direct) NEGATIVE  NEGATIVE Final   Comment: (NOTE)     A Rapid Antigen test may result negative if the antigen level in the     sample is below the detection level of this test. The FDA has not     cleared this test as a stand-alone test therefore the rapid antigen     negative result has reflexed to a Group A Strep culture.  CULTURE, GROUP A STREP     Status: None   Collection Time    09/03/13  6:31 AM      Result Value Ref Range Status   Specimen Description THROAT   Final   Special Requests NONE   Final   Culture     Final   Value: No Beta Hemolytic Streptococci Isolated     Performed at [facility]   Report Status 09/05/2013 FINAL   Final  URINE CULTURE     Status: None   Collection Time    09/03/13  7:43 AM      Result Value Ref Range Status   Specimen Description URINE, CATHETERIZED   Final   Special Requests NONE   Final   Culture  Setup Time     Final   Value: 09/03/2013 18:08     Performed at [place]     Final   Value: NO GROWTH     Performed at [facility]   Culture     Final   Value: NO GROWTH     Performed at [facility]   Report Status 09/04/2013 FINAL   Final  MRSA PCR SCREENING     Status: Abnormal   Collection Time    09/03/13  1:22 PM      Result Value Ref Range Status   MRSA by PCR POSITIVE (*) NEGATIVE Final   Comment:            The GeneXpert  MRSA Assay (FDA     approved for NASAL specimens     only), is one component of a     comprehensive MRSA colonization     surveillance program. It is not     intended to diagnose MRSA     infection nor to guide or     monitor treatment for     MRSA infections.     CRITICAL RESULT CALLED TO, READ BACK BY AND VERIFIED WITH:     ROGERS G,RN 1450 09/03/13 SCALES H  URINE CULTURE     Status: None   Collection Time    09/03/13  2:35 PM      Result Value Ref Range Status   Specimen Description URINE, CLEAN CATCH   Final    Special Requests NONE   Final   Culture  Setup Time     Final   Value: 09/03/2013 20:34     Performed at [place]     Final   Value: NO GROWTH     Performed at [facility]   Culture     Final   Value: NO GROWTH     Performed at [facility]   Report Status 09/04/2013 FINAL   Final     Studies:  Recent x-ray studies have been reviewed in detail by the Attending Physician  Time spent : 35 mins 09/06/2013, 1:37 PM   LOS: 3 days   I have examined the patient, reviewed the chart and modified the above note which I agree with.   [facility] A,MD [phone]  09/06/2013, 1:37 PM

## 2013-09-06 NOTE — Progress Notes (Signed)
CONSULT NOTE - Follow Up Consult  Pharmacy Consult for Coumadin, vancomycin, zosyn Indication: atrial fibrillation, r/o sepsis  No Known Allergies  Patient Measurements: Height: 5\' 7"  (170.2 cm) Weight: 240 lb (108.863 kg) IBW/kg (Calculated) : 61.6  Vital Signs: Temp: 98.2 F (36.8 C) (02/18 0617) Temp src: Oral (02/18 0617) BP: 116/55 mmHg (02/18 0617) Pulse Rate: 85 (02/18 0617)  Labs:  Recent Labs  09/03/13 1500 09/04/13 0435 09/04/13 0950 09/05/13 0255 09/06/13 0630  HGB 7.9* 7.7*  --  8.5* 8.4*  HCT 25.2* 25.7*  --  28.1* 27.8*  PLT PLATELETS APPEAR ADEQUATE 92*  --  111* 116*  APTT 58*  --   --   --   --   LABPROT 22.1*  --  23.5* 25.3* 25.0*  INR 2.00*  --  2.17* 2.39* 2.36*  CREATININE 1.37* 1.32*  --  1.37* 1.45*    Estimated Creatinine Clearance: 45.2 ml/min (by C-G formula based on Cr of 1.45).   Medications:  Scheduled:  . aspirin EC  81 mg Oral QPM  . atorvastatin  10 mg Oral QHS  . budesonide-formoterol  2 puff Inhalation BID  . calcium carbonate  500 mg of elemental calcium Oral BID WC  . Chlorhexidine Gluconate Cloth  6 each Topical Q0600  . cholecalciferol  400 Units Oral Daily  . docusate sodium  200 mg Oral QHS  . ferrous sulfate  325 mg Oral QPM  . gabapentin  600 mg Oral QID  . guaiFENesin  1,200 mg Oral BID  . metoprolol  25 mg Oral BID  . multivitamin with minerals  1 tablet Oral Daily  . mupirocin ointment  1 application Nasal BID  . pantoprazole  40 mg Oral Daily  . piperacillin-tazobactam (ZOSYN)  IV  3.375 g Intravenous 3 times per day  . vancomycin  1,500 mg Intravenous Q24H  . Warfarin - Pharmacist Dosing Inpatient   Does not apply q1800    Assessment: 73 yo F with h/o Afib, CKDIII, COPD, HFpEF presenting on 2/15 with SOB, productive cough, fevers and chills since yesterday. Pharmacy has been consulted to manage her anticoagulation and vancomcyin/zosyn while she is inpatient.  Coumadin therapy: Current INR is therapeutic  at 2.36 and hg/hct is low but stable. May see increased senstivity with antibiotics.  Home coumadin  Dose:  5 mg daily except 2.5 mg on M,W,F.   Vanc/zosyn: WBC= 3.4, afebrile, SCr= 1.45, CrCl ~  45. Plans noted for possible levaquin po.   2/15 Vanc>> 2/15 Zosyn>>  2/15 sputum>> 2/15 UCx>> neg 2/15 BCx2>> ngtd 2/15 rapid strep>>NEG   Goal of Therapy:  INR 2-3 Monitor platelets by anticoagulation protocol: Yes   Plan:  -Coumadin 2.5mg  PO x 1 dose tonight. -Daily INR -No antibiotic dose changes needed -Will consider a vancomycin trough if current antibiotics continue  Hildred Laser, Pharm D 09/06/2013 9:51 AM

## 2013-09-07 DIAGNOSIS — A419 Sepsis, unspecified organism: Secondary | ICD-10-CM

## 2013-09-07 DIAGNOSIS — N183 Chronic kidney disease, stage 3 unspecified: Secondary | ICD-10-CM

## 2013-09-07 LAB — CBC
HEMATOCRIT: 27.5 % — AB (ref 36.0–46.0)
HEMOGLOBIN: 8.3 g/dL — AB (ref 12.0–15.0)
MCH: 32.8 pg (ref 26.0–34.0)
MCHC: 30.2 g/dL (ref 30.0–36.0)
MCV: 108.7 fL — ABNORMAL HIGH (ref 78.0–100.0)
Platelets: 114 10*3/uL — ABNORMAL LOW (ref 150–400)
RBC: 2.53 MIL/uL — AB (ref 3.87–5.11)
RDW: 20.9 % — ABNORMAL HIGH (ref 11.5–15.5)
WBC: 3.3 10*3/uL — AB (ref 4.0–10.5)

## 2013-09-07 LAB — PROTIME-INR
INR: 2.32 — ABNORMAL HIGH (ref 0.00–1.49)
Prothrombin Time: 24.7 seconds — ABNORMAL HIGH (ref 11.6–15.2)

## 2013-09-07 LAB — GLUCOSE, CAPILLARY: Glucose-Capillary: 66 mg/dL — ABNORMAL LOW (ref 70–99)

## 2013-09-07 LAB — PROCALCITONIN: Procalcitonin: 4.27 ng/mL

## 2013-09-07 MED ORDER — METOPROLOL TARTRATE 50 MG PO TABS
50.0000 mg | ORAL_TABLET | Freq: Two times a day (BID) | ORAL | Status: DC
Start: 2013-09-07 — End: 2013-11-19

## 2013-09-07 MED ORDER — LEVOFLOXACIN 750 MG PO TABS: 750.0000 mg | ORAL_TABLET | Freq: Every day | ORAL | Status: DC

## 2013-09-07 NOTE — Discharge Summary (Signed)
Physician Discharge Summary  [name] [id number] DOB: Jul 12, 1941 DOA: 09/03/2013  PCP: Noralee Space, MD  Admit date: 09/03/2013 Discharge date: 09/07/2013  Time spent: 40 minutes  Recommendations for Outpatient Follow-up:  1. Followup with primary care physician within one week  Discharge Diagnoses:  Active Problems:   Chronic pain syndrome   GERD (gastroesophageal reflux disease)   COPD (chronic obstructive pulmonary disease)   Anemia, chronic disease   CKD (chronic kidney disease), stage III   Acute respiratory failure with hypoxia   Atrial fibrillation with RVR   Hypokalemia   ? CAP (community acquired pneumonia)   Dehydration   Sepsis(995.91)   Discharge Condition: None  Diet recommendation: None  Filed Weights   09/05/13 0500 09/06/13 0617 09/07/13 0500  Weight: 105.8 kg (233 lb 4 oz) 108.863 kg (240 lb) 110.523 kg (243 lb 10.5 oz)    History of present illness:  73 year old female patient with chronic atrial fibrillation on Coumadin, chronic kidney disease stage III, hypertension and COPD. Also has chronic diastolic heart failure and is on schedule diuretics. She presented to the emergency department with increasing shortness of breath associated with productive cough of thick green sputum with chills and fevers. Patient reported sudden onset of symptoms 24 hours prior to admission. She did endorse pleuritic chest pain with coughing. In emergency department her blood pressure was low at 84/66 with a heart rate of 133 and a temp of 102.8. She was maintaining O2 saturations of 96% on 2 L of oxygen. Hemoglobin 8.2 INR 1.9. Chest x-ray was without acute findings. CT of the chest with contrast revealed COPD possible underlying sarcoidosis and a nonspecific 5 mm nodule anterior aspect right upper lobe. In the ER she was given multiple nebulizer treatments as well as steroids without any improvement in her symptoms. Because of fever and suspicion for pneumonia she was  started on broad-spectrum antibiotics.  Hospital Course:   1. Sepsis: Presented with high fever, tachycardia and hypertension. Also positive procalcitonin. Source appears initially to be pulmonary but after the PCCM evaluated the patient with clear chest x-ray says this is to be unlikely to be of pulmonary source. Patient currently hemodynamically stable and the lactic acid as well as procalcitonin improved.  2. Acute respiratory failure with hypoxia: Presentation consistent with infectious process with procalcitonin markedly elevated at 26.4. His negative influenza panel, chest x-ray was negative, patient urinary Streptococcus antigen and Legionella were negative. Patient was on Solu-Medrol and inhaled bronchodilator and that was discontinued because of no wheezing. Patient treated with antibiotics empirically and she did much better, on the day of discharge Levaquin for 5 more days.  3. Mediastinal adenopathy: Pulmonary recommended outpatient followup, this is likely reactive to the current infectious process. No  4. Atrial fibrillation with RVR: This is likely driven by the hypotension as well as the hypoxia. Patient was on Cardizem drip at the time of discharge, also was receiving twice a day honest inhalers which was discontinued. At the time of discharge rate is controlled but patient has episodes of heart rate goes up to 150s with exertion. Metoprolol increased to 50 mg twice a day, this continues I think adding Cardizem as outpatient will be good choice.  5. CKD stage III: Presented with acute renal insufficiency related to dehydration, IV fluids was started the time of discharge, patient discharge with creatinine at baseline.  6. Anemia: Acute on chronic anemia, patient has chronic anemia secondary to CKD stage III, baseline creatinine is between 9.5-10.5. Hemoglobin dropped to 8.0  this is likely secondary to anemia secondary to acute  illness.  Procedures:  None  Consultations:  PCCM  Discharge Exam: Filed Vitals:   09/07/13 0451  BP: 122/66  Pulse: 114  Temp: 98.9 F (37.2 C)  Resp: 18   General: Alert and awake, oriented x3, not in any acute distress. HEENT: anicteric sclera, pupils reactive to light and accommodation, EOMI CVS: S1-S2 clear, no murmur rubs or gallops Chest: clear to auscultation bilaterally, no wheezing, rales or rhonchi Abdomen: soft nontender, nondistended, normal bowel sounds, no organomegaly Extremities: no cyanosis, clubbing or edema noted bilaterally Neuro: Cranial nerves II-XII intact, no focal neurological deficits  Discharge Instructions  Discharge Orders   Future Appointments Provider Department Dept Phone   09/21/2013 10:00 AM Noralee Space, MD [facility] [phone]   09/27/2013 8:20 AM Tommy Medal, RPH-CPP [facility] [phone]   Future Orders Complete By Expires   Diet - low sodium heart healthy  As directed    Increase activity slowly  As directed        Medication List         albuterol (2.5 MG/3ML) 0.083% nebulizer solution  Commonly known as:  PROVENTIL  Take 2.5 mg by nebulization 4 (four) times daily as needed for wheezing or shortness of breath.     ALPRAZolam 0.5 MG tablet  Commonly known as:  XANAX  Take 0.25-0.5 mg by mouth 4 (four) times daily as needed for anxiety.     amitriptyline 50 MG tablet  Commonly known as:  ELAVIL  Take 100 mg by mouth at bedtime.     aspirin EC 81 MG tablet  Take 81 mg by mouth every evening.     budesonide-formoterol 160-4.5 MCG/ACT inhaler  Commonly known as:  SYMBICORT  Inhale 2 puffs into the lungs 2 (two) times daily.     calcium-vitamin D 500-200 MG-UNIT per tablet  Take 1 tablet by mouth 2 (two) times daily with a meal.     chlorpheniramine-HYDROcodone 10-8 MG/5ML Lqcr  Commonly known as:  TUSSIONEX PENNKINETIC ER  Take 5 mLs by mouth every 12 (twelve) hours as needed for  cough.     docusate sodium 100 MG capsule  Commonly known as:  COLACE  Take 200 mg by mouth at bedtime.     ferrous sulfate 325 (65 FE) MG tablet  Take 325 mg by mouth every evening.     furosemide 40 MG tablet  Commonly known as:  LASIX  Take 40 mg by mouth 2 (two) times daily.     gabapentin 600 MG tablet  Commonly known as:  NEURONTIN  Take 600 mg by mouth 4 (four) times daily.     GAS-X PO  Take 1 tablet by mouth daily as needed (for gas).     guaiFENesin 600 MG 12 hr tablet  Commonly known as:  MUCINEX  Take 1,200 mg by mouth 2 (two) times daily.     isosorbide mononitrate 30 MG 24 hr tablet  Commonly known as:  IMDUR  Take 1 tablet (30 mg total) by mouth at bedtime.     levofloxacin 750 MG tablet  Commonly known as:  LEVAQUIN  Take 1 tablet (750 mg total) by mouth daily.     metoprolol 50 MG tablet  Commonly known as:  LOPRESSOR  Take 1 tablet (50 mg total) by mouth 2 (two) times daily.     multivitamin with minerals Tabs tablet  Take 1 tablet by mouth daily.  nitroGLYCERIN 0.4 MG SL tablet  Commonly known as:  NITROSTAT  Place 0.4 mg under the tongue every 5 (five) minutes as needed for chest pain.     omeprazole 20 MG capsule  Commonly known as:  PRILOSEC  Take 20 mg by mouth 2 (two) times daily.     oxyCODONE-acetaminophen 5-325 MG per tablet  Commonly known as:  PERCOCET/ROXICET  Take 2 tablets by mouth every 4 (four) hours as needed for pain.     potassium chloride SA 20 MEQ tablet  Commonly known as:  K-DUR,KLOR-CON  Take 20 mEq by mouth 2 (two) times daily.     promethazine 25 MG tablet  Commonly known as:  PHENERGAN  Take 25 mg by mouth every 6 (six) hours as needed for nausea.     simvastatin 20 MG tablet  Commonly known as:  ZOCOR  Take 20 mg by mouth at bedtime.     warfarin 5 MG tablet  Commonly known as:  COUMADIN  - Take 2.5-5 mg by mouth See admin instructions. Take 0.5 tablet Monday, Wednesday and Friday  - Take 1 tablet  Tuesday, Thursday, Saturday and Sunday       No Known Allergies     Follow-up Information   Follow up with Noralee Space, MD.   Specialty:  Pulmonary Disease   Contact information:   [address] [place] [address] [phone]        The results of significant diagnostics from this hospitalization (including imaging, microbiology, ancillary and laboratory) are listed below for reference.    Significant Diagnostic Studies: Dg Chest 2 View  09/06/2013   CLINICAL DATA:  Shortness of breath, cough  EXAM: CHEST - 2 VIEW  COMPARISON:  09/03/2013  FINDINGS: Previous CABG. Heart size upper limits normal. Lungs are clear. Surgical clips in the left upper abdomen. . No effusion.  IMPRESSION: No acute disease post CABG.   Electronically Signed   By: Arne Cleveland M.D.   On: 09/06/2013 18:46   Ct Chest W Contrast  08/31/2013   CLINICAL DATA:  Shortness of breath.  Productive cough.  EXAM: CT CHEST WITH CONTRAST  TECHNIQUE: Multidetector CT imaging of the chest was performed during intravenous contrast administration.  CONTRAST:  24mL OMNIPAQUE IOHEXOL 300 MG/ML  SOLN  COMPARISON:  Chest CT 09/13/2009.  FINDINGS: Mediastinum: Heart size is mildly enlarged. There is no significant pericardial fluid, thickening or pericardial calcification. There is atherosclerosis of the thoracic aorta, the great vessels of the mediastinum and the coronary arteries, including calcified atherosclerotic plaque in the left main, left anterior descending, left circumflex and right coronary arteries. Status post median sternotomy for CABG, including LIMA to the distal LAD. There are numerous borderline enlarged and enlarged mediastinal and bilateral hilar lymph nodes. Specific examples include an 11 mm short axis left hilar lymph node (image 21 of series 2), 1.2 cm short axis subcarinal lymph node (image 24 of series 2), 1.2 cm low right paratracheal lymph node (image 17 of series 2), and a 1.5 cm short axis anterior  mediastinal lymph node (image 17 of series 2). These are similar to but increased in size compared to the prior examination from 09/13/2009. Esophagus is unremarkable in appearance.  Lungs/Pleura: 5 mm right upper lobe nodule (image 17 of series 3). No other definite suspicious appearing pulmonary nodules or masses are identified at this time. There are a few areas of very mild nodular thickening of the fissures, however, this is not a very prominent finding.  Mild centrilobular and paraseptal emphysema, most pronounced in the lung apices. Mild diffuse bronchial wall thickening. No acute consolidative airspace disease. No pleural effusions.  Upper Abdomen: Extensive atherosclerosis. Postoperative changes in the proximal stomach.  Musculoskeletal: Old compression fracture of T4 with approximately 25% loss of anterior vertebral body height is similar to the prior examination. Prominent Schmorl's nodes in the inferior endplate of L1 and superior endplate of L2. Median sternotomy wires. There are no aggressive appearing lytic or blastic lesions noted in the visualized portions of the skeleton.  IMPRESSION: 1. No definite acute findings to account for the patient's symptoms. 2. There is mild diffuse bronchial wall thickening with mild centrilobular and paraseptal emphysema, which is suggestive of underlying COPD. 3. In addition, there is mediastinal and bilateral hilar lymphadenopathy. This is nonspecific, but suggestive of a systemic disease. Given the very mild nodular thickening of portions of the interlobar fissures in the lungs, this may be related to underlying sarcoidosis. However, clinical correlation for signs and symptoms of lymphoproliferative disorder is also recommended. 4. In addition, there is a new nonspecific 5 mm nodule in the anterior aspect of the right upper lobe (image 17 of series 3). Given the smoking related changes in the lungs, the patient is at high risk for bronchogenic carcinoma, and a  follow-up chest CT at 6-12 months is recommended. This recommendation follows the consensus statement: Guidelines for Management of Small Pulmonary Nodules Detected on CT Scans: A Statement from the [place] as published in Radiology 2005;237:395-400. 5. Atherosclerosis, including left main and 3 vessel coronary artery disease. Status post median sternotomy for CABG including LIMA to the LAD. 6. Additional incidental findings, as above.   Electronically Signed   By: Vinnie Langton M.D.   On: 08/31/2013 11:06   Dg Chest Port 1 View  09/03/2013   CLINICAL DATA:  73 year old female with altered mental status. Initial encounter.  EXAM: PORTABLE CHEST - 1 VIEW  COMPARISON:  [place] Chest CT 08/31/2013 and earlier.  FINDINGS: Stable lung volumes from the recent CT. Sequelae of CABG again noted. Stable cardiac size and mediastinal contours. Visualized tracheal air column is within normal limits. No pneumothorax pulmonary edema or pleural effusion. Stable lung markings including basilar predominant increased interstitial opacity.  IMPRESSION: No acute cardiopulmonary abnormality.   Electronically Signed   By: Lars Pinks M.D.   On: 09/03/2013 05:54    Microbiology: Recent Results (from the past 240 hour(s))  CULTURE, BLOOD (ROUTINE X 2)     Status: None   Collection Time    09/03/13  5:00 AM      Result Value Ref Range Status   Specimen Description BLOOD ARM LEFT   Final   Special Requests BOTTLES DRAWN AEROBIC AND ANAEROBIC 10CC   Final   Culture  Setup Time     Final   Value: 09/03/2013 13:28     Performed at [facility]   Culture     Final   Value:        BLOOD CULTURE RECEIVED NO GROWTH TO DATE CULTURE WILL BE HELD FOR 5 DAYS BEFORE ISSUING A FINAL NEGATIVE REPORT     Performed at [facility]   Report Status PENDING   Incomplete  CULTURE, BLOOD (ROUTINE X 2)     Status: None   Collection Time    09/03/13  5:15 AM      Result Value Ref Range Status    Specimen Description BLOOD ARM RIGHT   Final  Special Requests BOTTLES DRAWN AEROBIC ONLY 5CC   Final   Culture  Setup Time     Final   Value: 09/03/2013 13:29     Performed at [facility]   Culture     Final   Value:        BLOOD CULTURE RECEIVED NO GROWTH TO DATE CULTURE WILL BE HELD FOR 5 DAYS BEFORE ISSUING A FINAL NEGATIVE REPORT     Performed at [facility]   Report Status PENDING   Incomplete  RAPID STREP SCREEN     Status: None   Collection Time    09/03/13  6:31 AM      Result Value Ref Range Status   Streptococcus, Group A Screen (Direct) NEGATIVE  NEGATIVE Final   Comment: (NOTE)     A Rapid Antigen test may result negative if the antigen level in the     sample is below the detection level of this test. The FDA has not     cleared this test as a stand-alone test therefore the rapid antigen     negative result has reflexed to a Group A Strep culture.  CULTURE, GROUP A STREP     Status: None   Collection Time    09/03/13  6:31 AM      Result Value Ref Range Status   Specimen Description THROAT   Final   Special Requests NONE   Final   Culture     Final   Value: No Beta Hemolytic Streptococci Isolated     Performed at [facility]   Report Status 09/05/2013 FINAL   Final  URINE CULTURE     Status: None   Collection Time    09/03/13  7:43 AM      Result Value Ref Range Status   Specimen Description URINE, CATHETERIZED   Final   Special Requests NONE   Final   Culture  Setup Time     Final   Value: 09/03/2013 18:08     Performed at [place]     Final   Value: NO GROWTH     Performed at [facility]   Culture     Final   Value: NO GROWTH     Performed at [facility]   Report Status 09/04/2013 FINAL   Final  MRSA PCR SCREENING     Status: Abnormal   Collection Time    09/03/13  1:22 PM      Result Value Ref Range Status   MRSA by PCR POSITIVE (*) NEGATIVE Final   Comment:            The  GeneXpert MRSA Assay (FDA     approved for NASAL specimens     only), is one component of a     comprehensive MRSA colonization     surveillance program. It is not     intended to diagnose MRSA     infection nor to guide or     monitor treatment for     MRSA infections.     CRITICAL RESULT CALLED TO, READ BACK BY AND VERIFIED WITH:     ROGERS G,RN 1450 09/03/13 SCALES H  URINE CULTURE     Status: None   Collection Time    09/03/13  2:35 PM      Result Value Ref Range Status   Specimen Description URINE, CLEAN CATCH   Final   Special Requests NONE  Final   Culture  Setup Time     Final   Value: 09/03/2013 20:34     Performed at [place]     Final   Value: NO GROWTH     Performed at [facility]   Culture     Final   Value: NO GROWTH     Performed at [facility]   Report Status 09/04/2013 FINAL   Final     Labs: Basic Metabolic Panel:  Recent Labs Lab 09/03/13 0454 09/03/13 1500 09/04/13 0435 09/05/13 0255 09/06/13 0630  NA 138 140 137 137 138  K 3.6* 4.0 4.7 4.5 4.2  CL 97 102 101 102 104  CO2 27 22 23 19 24   GLUCOSE 98 218* 182* 113* 83  BUN 18 20 25* 31* 29*  CREATININE 1.47* 1.37* 1.32* 1.37* 1.45*  CALCIUM 8.9 8.3* 8.6 8.5 8.6  MG  --  1.9  --   --   --   PHOS  --  3.1  --   --   --    Liver Function Tests:  Recent Labs Lab 09/03/13 0454 09/03/13 1500 09/04/13 0435  AST 23 24 25   ALT 12 12 14   ALKPHOS 108 97 85  BILITOT 1.3* 1.2 0.9  PROT 7.0 7.0 6.9  ALBUMIN 3.3* 3.1* 3.1*   No results found for this basename: LIPASE, AMYLASE,  in the last 168 hours No results found for this basename: AMMONIA,  in the last 168 hours CBC:  Recent Labs Lab 09/03/13 0454 09/03/13 1500 09/04/13 0435 09/05/13 0255 09/06/13 0630 09/07/13 0416  WBC 7.8 7.1 7.3 6.0 3.4* 3.3*  NEUTROABS 5.3 5.9  --   --   --   --   HGB 8.2* 7.9* 7.7* 8.5* 8.4* 8.3*  HCT 26.7* 25.2* 25.7* 28.1* 27.8* 27.5*  MCV 108.5* 109.1* 109.4*  110.6* 110.3* 108.7*  PLT 160 PLATELETS APPEAR ADEQUATE 92* 111* 116* 114*   Cardiac Enzymes:  Recent Labs Lab 09/03/13 0454  TROPONINI <0.30   BNP: BNP (last 3 results)  Recent Labs  09/03/13 1500  PROBNP 14053.0*   CBG:  Recent Labs Lab 09/06/13 0741 09/07/13 0743  GLUCAP 75 66*       Signed:  Meadow Abramo A  [name] 09/07/2013, 10:31 AM

## 2013-09-08 ENCOUNTER — Telehealth: Payer: Self-pay | Admitting: Pulmonary Disease

## 2013-09-08 MED ORDER — FLUCONAZOLE 100 MG PO TABS: ORAL_TABLET | ORAL | Status: DC

## 2013-09-08 NOTE — Telephone Encounter (Signed)
Called spoke with thrush. She reports she was released from the hospital yesterday. She reports she was told she had thrush in her mouth. She was not giving anything for this. She reports her mouth hurts. Please advise SN thanks  No Known Allergies

## 2013-09-08 NOTE — Telephone Encounter (Signed)
Calling back asking for medication.  She states he mouth is very painful.

## 2013-09-08 NOTE — Telephone Encounter (Signed)
Per SN---  Ok to call in diflucan 100 mg  #6  2 today then 1 daily until gone.  Called and spoke with pts husband and he is aware of meds that have been sent to the pharmacy and nothing further is needed.

## 2013-09-09 LAB — CULTURE, BLOOD (ROUTINE X 2)
Culture: NO GROWTH
Culture: NO GROWTH

## 2013-09-21 ENCOUNTER — Ambulatory Visit (INDEPENDENT_AMBULATORY_CARE_PROVIDER_SITE_OTHER): Payer: Medicare Other | Admitting: Pulmonary Disease

## 2013-09-21 ENCOUNTER — Encounter: Payer: Self-pay | Admitting: Pulmonary Disease

## 2013-09-21 ENCOUNTER — Ambulatory Visit (INDEPENDENT_AMBULATORY_CARE_PROVIDER_SITE_OTHER): Payer: Medicare Other

## 2013-09-21 VITALS — BP 120/84 | HR 88 | Temp 98.0°F | Ht 66.0 in | Wt 206.2 lb

## 2013-09-21 DIAGNOSIS — N183 Chronic kidney disease, stage 3 unspecified: Secondary | ICD-10-CM

## 2013-09-21 DIAGNOSIS — I872 Venous insufficiency (chronic) (peripheral): Secondary | ICD-10-CM | POA: Insufficient documentation

## 2013-09-21 DIAGNOSIS — I679 Cerebrovascular disease, unspecified: Secondary | ICD-10-CM

## 2013-09-21 DIAGNOSIS — M545 Low back pain, unspecified: Secondary | ICD-10-CM

## 2013-09-21 DIAGNOSIS — M1990 Unspecified osteoarthritis, unspecified site: Secondary | ICD-10-CM

## 2013-09-21 DIAGNOSIS — G894 Chronic pain syndrome: Secondary | ICD-10-CM

## 2013-09-21 DIAGNOSIS — E669 Obesity, unspecified: Secondary | ICD-10-CM

## 2013-09-21 DIAGNOSIS — E538 Deficiency of other specified B group vitamins: Secondary | ICD-10-CM

## 2013-09-21 DIAGNOSIS — J9601 Acute respiratory failure with hypoxia: Secondary | ICD-10-CM

## 2013-09-21 DIAGNOSIS — J449 Chronic obstructive pulmonary disease, unspecified: Secondary | ICD-10-CM

## 2013-09-21 DIAGNOSIS — J9600 Acute respiratory failure, unspecified whether with hypoxia or hypercapnia: Secondary | ICD-10-CM

## 2013-09-21 DIAGNOSIS — I2510 Atherosclerotic heart disease of native coronary artery without angina pectoris: Secondary | ICD-10-CM

## 2013-09-21 DIAGNOSIS — D638 Anemia in other chronic diseases classified elsewhere: Secondary | ICD-10-CM

## 2013-09-21 DIAGNOSIS — E785 Hyperlipidemia, unspecified: Secondary | ICD-10-CM

## 2013-09-21 DIAGNOSIS — K219 Gastro-esophageal reflux disease without esophagitis: Secondary | ICD-10-CM

## 2013-09-21 DIAGNOSIS — Z951 Presence of aortocoronary bypass graft: Secondary | ICD-10-CM

## 2013-09-21 DIAGNOSIS — I4891 Unspecified atrial fibrillation: Secondary | ICD-10-CM

## 2013-09-21 LAB — BRAIN NATRIURETIC PEPTIDE: Pro B Natriuretic peptide (BNP): 203 pg/mL — ABNORMAL HIGH (ref 0.0–100.0)

## 2013-09-21 LAB — CBC WITH DIFFERENTIAL/PLATELET
BASOS ABS: 0.1 10*3/uL (ref 0.0–0.1)
Basophils Relative: 2.9 % (ref 0.0–3.0)
EOS ABS: 0.1 10*3/uL (ref 0.0–0.7)
Eosinophils Relative: 3.2 % (ref 0.0–5.0)
HCT: 32.6 % — ABNORMAL LOW (ref 36.0–46.0)
HEMOGLOBIN: 10.5 g/dL — AB (ref 12.0–15.0)
Lymphs Abs: 1.5 10*3/uL (ref 0.7–4.0)
MCHC: 32.2 g/dL (ref 30.0–36.0)
MCV: 102.8 fl — ABNORMAL HIGH (ref 78.0–100.0)
MONOS PCT: 12.1 % — AB (ref 3.0–12.0)
Monocytes Absolute: 0.3 10*3/uL (ref 0.1–1.0)
NEUTROS ABS: 0.5 10*3/uL — AB (ref 1.4–7.7)
Neutrophils Relative %: 20.8 % — ABNORMAL LOW (ref 43.0–77.0)
PLATELETS: 146 10*3/uL — AB (ref 150.0–400.0)
RBC: 3.18 Mil/uL — ABNORMAL LOW (ref 3.87–5.11)
RDW: 22.5 % — AB (ref 11.5–14.6)
WBC: 2.4 10*3/uL — ABNORMAL LOW (ref 4.5–10.5)

## 2013-09-21 LAB — FERRITIN: FERRITIN: 107.2 ng/mL (ref 10.0–291.0)

## 2013-09-21 LAB — VITAMIN B12: Vitamin B-12: 774 pg/mL (ref 211–911)

## 2013-09-21 LAB — IBC PANEL
IRON: 58 ug/dL (ref 42–145)
SATURATION RATIOS: 18.3 % — AB (ref 20.0–50.0)
Transferrin: 226.1 mg/dL (ref 212.0–360.0)

## 2013-09-21 MED ORDER — FLUTICASONE-SALMETEROL 250-50 MCG/DOSE IN AEPB: 1.0000 | INHALATION_SPRAY | Freq: Two times a day (BID) | RESPIRATORY_TRACT | Status: DC

## 2013-09-21 MED ORDER — NORTRIPTYLINE HCL 50 MG PO CAPS: 50.0000 mg | ORAL_CAPSULE | Freq: Every day | ORAL | Status: DC

## 2013-09-21 MED ORDER — ALBUTEROL SULFATE (2.5 MG/3ML) 0.083% IN NEBU: 2.5000 mg | INHALATION_SOLUTION | Freq: Four times a day (QID) | RESPIRATORY_TRACT | Status: DC | PRN

## 2013-09-21 NOTE — Progress Notes (Addendum)
Subjective:    Patient ID: [name], female    DOB: July 21, 1940, 73 y.o.   MRN: [id number]  HPI 73 y/o WF here for a follow up visit... she has ASHD w/ prev CABG and chr AFib on Coumadin followed by DrBerry... she also has FM/ chr pain syndrome followed by [name]... we have followed her primarily for COPD/ Asthmatic Bronchitis- see below...  ~  May 24, 2012:  72mo ROV & [name] was [facility] by [facility] 9/19 - 04/13/12 for weakness, dizzy, syncope in the setting of hypotension & acute anemia w/ GIbleed while on Coumadin> he specific numbers were BP=90/40, Hg=7.3, stools pos for blood, INR=2.19;  She received 2u blood, FFP, VitK;  She had EGD= neg (intact Nissen) & Colonoscopy= neg, no bleeding source found;  Coumadin was help then restarted;  Only med change was incr Metoprolol 25mg  tabs- take 3tabs Bid...  Since disch she saw TP 10/13> doing satis & Hg= 11.3;  She had f/u [facility] 10/13> stable chr AFib w/ controlled VR, same meds, no changes made...    COPD> on AlbutNEBS, Symbicort160, Mucinex; breating is stable & chest clear at present, RR=18, O2sat=94% on RA...    Cards> CAD, s/p CABG, chr AFib> on Imdur, Metoprolol, Lasix, KCl, Coumadin; [facility]...    Chol> on Simva20; last FLP at goals- continue same, get on diet, get wt down...    Obesity> she has gone 255 ==> 236 ==> 245 today; we reviewed diet, exercise, wt reduction strategies...    Renal Insuffic> prev labs in the 1.5 to 1.7 range, sl better during her Hosp reading 1.2 to 1.3, but now back to 1.6=>1.8 on her same outpt meds...    Rheum> DJD, Compression T4 & L2, chr pain syndrome, FM> she is followed by [name], DrTooke, [name]; on Percocet Tid Prn & Neurontin 600mg Bid;  Also takes Amitriptyline 50mg - 2hs & Flexeril 10 Tid Prn...    Anxiety> on Alprazolam 0.5mg  Tid as needed... We reviewed prob list, meds, xrays and labs> see below for updates >> she had Flu vaccine 9/13...  LABS 11/13:   Chems-  Ok x mild renal insuffic w/ BUN=25, Creat=1.8;  CBC- mild anemia w/ Hg=10.7, Fe=88 (21%)...  ~  October 24, 2012:  72mo ROV & [name] was [facility] 12/13 for Left TKR by Hillery Aldo & she did well w/ Lovenox bridging & no medical complications...  She went to the ER 4/14 w/ syncopal spell at home when she got up to go to the bathroom hitting her head & right arm;  ER eval was neg XRays were neg, and CTHead showed mild diffuse atrophy & vol loss w/ microvasc ischemic dis, no acute intracranial process;  Labs revealed Hg=10.8, Cr=1.66, Protime=27.3/ INR=2.69; Urine cloudy w/ WBCs (given Rocephin=>Keflex)...  We discussed checking MRI Brain and CDopplers;  She also needs f/u w/ Cards to r/o cardiac arrhythmia etc...     We reviewed prob list, meds, xrays and labs> see below for updates >> she reports that her insurance co requires change of Flexeril10 to Zanaflex4mg , and Phenergan25 to Zofran4mg ...  LABS 3-4/14:  FLP- looks good on Simva20;  Chems- ok x Creat=1.66;  CBC- Hg~11, MCV=91, & Fe=62 (17%);  TSH=2.88...  CTHead 4/14 showed mild diffuse atrophy & vol loss w/ microvasc ischemic dis, no acute intracranial process...  MRI Brain & CDopplers have been ordered & pending...  Cardiac f/u appt w/ DrBerry...  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 She remains on ASA81 + Coumadin via [facility];  Meds were adjusted- see below;  She had recent f/u [facility], DrBerry> s/p CABG 1997 w/ patent grafts on cath 2011 and non-ischemic myoview 7/13;  AFib rate controlled on Metop & anticoag on coumadin; BP controlled on meds w/o postural drop...     She had a bronchitic exac 6/14 & we called in Levaquin w/ resolution of her symptoms & back to baseline now...     Her CC= hurts all over w/ pain in knees esp; she is followed by.DrBeekman for Rheum on Oxycodone which he writes; she notes that she has Paget's in a LE bone & DrB wants to Rx w/ "shots" but she has declined & recalls DrRowe's prev advise; we do not have notes from Rheum to review...  We reviewed prob list, meds, xrays and labs> see below for updates >>   ~  May 24, 2013:  72mo ROV & [name] presents w/ LLQ pain & sl tenderness x several weeks now- she denies n/v, but is sl constip going 1-2 per wk and assoc w/ fair appetite and 29# weight loss to 218# today; GI hx includes GERD on Prilosec 20bid & she had EGD 9/13 by DrHung w/ intact Nissen & normal esoph/ gastric/ duod;  Her last colon was 9/13 by DrStark & also normal w/o divertics, polyps, or other lesions;  She had CT Pelvis 9/14 in ER w/ avulsion fracture of the greater trochanter of the proximal right femur;  Exam shows tender to palp in LLQ, no mass felt => we decided to check labs and proceed w/ CT Abd...     COPD is stable> she notes sl cough, occas green sput, stable DOE, etc; on NEBS w/ Albut Qid, Mucinex 2Bid, Fluids; recently had Augmentin & Pred called in for an exac- improved...    CAD, s/pCABG, AFib> followed by DrBerry on Coumadin, Imdur30, Metop50-1/2Bid, Lasix40Bid, K20Bid; BP= 118/60 & she notes irreg tachycardia w/ activity, no CP or ch in SOB...    Hyperlipid> on Simva20 w/ excellent numbers...    Overweight> peak wt in the 260's & she was last 247# in [id number]; today she weighs 218# for a 29# wt reduction; on questioning she isn't really dieting but avoiding sweets, eating smaller portions, & appetite is OK?    Rheum- DJD, chrLBP, compression fx, FM, Paget's, chronic pain syndrome> DrBeekman gives her Percocet, also  takes Neurontin600Qid, Amitriptyine50Qhs We reviewed prob list, meds, xrays and labs> see below for updates >>   LABS 11/14:  Chems- ok x BS=124;  CBC- stable anemia w/ Hg=10.5;  Sed=53;  CRP=7.3.Marland KitchenMarland Kitchen  CT Abd&Pelvis 11/14 is NEG x for prior gastric surg/ GB/ Hyst & a 3cm cystic lesion in left inguinal region ?etiology=> refer to [facility] for their review (she never went)...  ~  July 18, 2013:  6-7wk ROV & add-on for 2wk hx URI characterized by sore throat, cough, dark green phlegm, body aches, fever to 101+ and chills; she had called w/  Augmentin called in, then Medrol dosepak, but she says no better; she had the Flu vaccine 11/14;  Exam showed Temp 101, VSS, nasal congestion, few scat rhonchi w/o consolidation;  CXR showed heart at upper lim of norm, s/p CABG, chronic changes in lungs w/o acute dis/ NAD;  LABS revealed:  Chems- ok x Cr=1.6;  CBC- anemic w/ Hg=9.5 (MCV=101), WBC=4.8;  Sput C&S collected 12/27 showed MRSA=> Rec treatment w/ SeptraDS- 1Bid x10d, Align, Yogurt, Prednisone20mg - 3d tapering schedule, Mucinex- 1200mg Bid, Fluids, Tylenol, etc...     BP, CAD, AFib> stable on meds, see above, continue same...     Other medical issues as noted...   ~  August 25, 2013:  6wk ROV & recheck> seen 12/14 w/ URI, s/p Augmentin & Medrol dosepak but no better, CXR w/ chr changes but NAD, Labs w/ Anemia=9.5 & Cr=1.6, prev sput grew MRSA & given SeptraDS & Pred taper along w/ Mucinex, Fluids, etc (also took Levaquin after the Septra)...  She reports that she is perhaps sl better but has persist cough, green-brown phlegm, some dyspnea, using NEBS w/ Albut Qid;  Her prev Symbicort was stopped in the past by [name] & she has not done as well since then she thinks...  We discussed checking PFT, CT Chest for completeness, recheck sput C&S, adding DULERA200-5 2spBid while continuing her NEBS Bid, Mucinex1200Bid, Fluids, Delsym, Tussionex... She is not on an ACE, coumadin monitored by Cards, on antireflux regimen &  Prilosec 20mg bid...     We reviewed prob list, meds, xrays and labs> see below for updates >> she had the 2014 Flu shot in Nov...  CXR 1/15 showed s/pCABG, incr interstitial markings, NAD...  PFTs 2/15 showed FVC=1.75 (56%), FEV1=1.40 (60%), %1sec=80, mid-flows=71% predicted... This is mostly restricted w/ sm airways dis...  CT Chest =>  Done 08/31/13>  No acute findings; mild bronchial thickening & centrilob/paraseptal emphysema; 32mm RUL nodule & scat adenopathy; cardiomeg, s/p CABG, & atherosclerosis in Ao & coronaries; partial compression of T4...  Sputum for C&S =>  MRSA sens to Clinda, Genta, Linezolid, Rifamp, TCN, Septra, Vanco => treated w/ SeptraDS x 10d...    ~  September 21, 2013:  25mo ROV & post hosp check> Adm 2/15 - 09/07/13 by [facility] w/ COPD exac- incr SOB, cough w/ thick green sput, & chest discomfort; Temp was 102.8 w/ tachycardia & a low BP; PCT was elev at 26 and BNP was elev at 14000, however CXR was neg, culltures all neg (x for the MRSA that grew PTA) & source was unknown; she was treated w/ antibiotics empirically (Levaquin), Steroids, Oxygen, Nebs, Lasix, etc; Breathing is improved, feeling better, wt is down 9#...  Here for f/u visit, recheck labs, & review>>    COPD> on AlbutNEBS-tid, Mucinex, & she stopped Symbicort on her own ("it raised my BP"); we have substituted ADVAIR250-Bid, continue NEBS and Mucinex/ Fluids...    Cards> CAD, s/p CABG, chr AFib> on Imdur30, Metoprolol50Bid, Lasix40Bid, KCl20Bid, Coumadin; [facility]; BP=120/84 & BNP was up to 14000 & improved w/ diuresis- now 203 & we continued the Lasix40Bid.    Chol> on Simva20; last FLP 3/14 at goals w/ TChol 81, TG 85, HDL 34, LDL 30; continue same, get on diet, get wt down...    Obesity> she has gone 255 ==> 206 today; we reviewed diet, exercise, wt reduction strategies...    GI> GERD-s/p Nissen, IBS, s/p GB & Hyst, 3cm lesion left inguinal area seen on CT 11/14>  on Prilosec20,  colase, GasX; she did not go to [facility] for eval of left inguinal cyst=> needs re-imagining now...    Renal Insuffic> prev labs in the 1.5 to 1.7 range, sl better during her Hosp reading 1.3 to 1.5, she has had her Lasix adjusted...    Rheum> DJD, Compression T4 & L2, chr pain syndrome, FM> she is followed by [name], DrTooke, [name]; on Percocet Tid Prn & Neurontin 600mg ?Qid;  Also takes Amitriptyline 50mg -2hs & requests chenage due to $$- try [id number].    Anxiety> on Alprazolam 0.5mg  Tid as needed... We reviewed prob list, meds, xrays and labs> see below for updates >>   CXR 2/15 showed heart size at upper lim, s/p CABG, clear lungs, NAD.Marland KitchenMarland Kitchen  EKG 2/15 showed AFib, rate125, NSSTTWA...  LABS 3/15:  CBC- Hg=10.5, WBC=2.4 w/ 21% neutrophils;  Fe=58 (18%);  Ferritin=107;  B12=774;  BNP=203;  PCT=0.27;  SPE/IEP- 2 M-spikes are noted & marked incr IgA w/ depressed IgM, IgA lambda paraprot is noted...  ALPHA-1-ANTITRYPSIN level = 202 w/ MM phenotype  AMBULATORY O2 SATS> done 3/15 on RA> O2sat=96% at rest w/ HR104; Ambulated 2 laps w/ lowestO2sat=95% w/ HR131... Needs CT Pelvis to compare to 11/14 => pending... Needs referral to [facility] for prob Mult Myeloma (new diagnosis)            Problem List:  COPD (ICD-496) - ex smoker, quit 11yrs... uses nebulizer w/ ALBUTEROL Qid vs Proventil HFA & MUCINEX 1-2 Bid w/ fluids... baseline CXR shows prev CABG surg, borderline Cor, NAD... prev PFT's 12/05 showed more restriction (from effort and obesity) than obstruction... ~  CXR 10/09 in hosp showed biapical pleural thickening, NAD. ~  5/10:  bronchitic exas treated w/ Levaquin/ Medrol/ etc... 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 ~  12/10:  another exac requiring Levaquin/ Pred... we will add SYMBICORT160- 2sp Bid. ~  Improved w/ addition of Symbicort, but still c/o occas exac requiring antibiotics etc; she stopped the Symbicort on her own... ~  CXR 6/11 showed mild cardiomeg, s/p CABG, calcif Ao, clear/  NAD.Marland Kitchen. ~  CXR 1-2/13 showed borderline heart size, s/p CABG, lungs clear w/ min peribronch thickening, DJD spine, osteopenia... ~  PFT 6/13 showed FVC=2.01 (59%), FEV1=1.62 (64%), %1sec=81, mid-flows=81%pred==> more restricted than obstructed; asked to contue meds & LOSE WEIGHT! ~  CXR 9/13 showed normal heart size, s/p CABG, sl pulm vasc congestion, NAD.Marland Kitchen. ~  CXR 12/13 showed borderline heart size, post op bypass changes, chr bronchitic interstitial lung changes w/o acute dis... ~  7/14: breathing is stable on Albut NEBS, Mucinex, & Levaquin recently called in for bronchitis; she declines to restart Symbicort... ~  12/14: presented w/ acute bronchitic exac> treated w/ Levaquin=> changed to SeptraDS w/ Sput cult showing MRSA; plus Pred taper, Mucinex, etc... ~  CXR 1/15 showed s/p CABG, incr interstitial markings, NAD... ~  PFTs 2/15 showed FVC=1.75 (56%), FEV1=1.40 (60%), %1sec=80, mid-flows=71% predicted... ~  CT Chest 2/15 showed no acute findings; mild bronchial thickening & centrilob/paraseptal emphysema; 92mm RUL nodule & scat adenopathy; cardiomeg, s/p CABG, & atherosclerosis in Ao & coronaries; partial compression of T4. ~  Sput C&S 2/15 grew MRSA sens to Clinda, Genta, Linezolid, Rifamp, TCN, Septra, Vanco => treated w/ SeptraDS x 10d... ~  subseq hosp 2/15- 09/07/13 by [facility] w/ SOB, cough/sput, neg CXR, neg cultures, pos PCT~26, & elev BNP~14000; she was diuresed, placed on antibiotics (disch on Levaquin) & improved w/ this & COPD rx... ~  3/15: on AlbutNEBS-tid, Mucinex, &  she stopped Symbicort on her own ("it raised my BP"); we have substituted ADVAIR250-Bid, continue NEBS and Mucinex/ Fluids  ARTERIOSCLEROTIC HEART DISEASE (ICD-414.00) - followed by DrBerry & [facility]; s/p CABG in 1997...  on IMDUR 30mg /d, METOPROLOL50-1/2Bid, LASIX 40mg Bid, and K20Bid... ~  NuclearStressTests in 8/05 showing infer scar and mild inferolat ischemia...  ~  2DEcho 10/07 showed EF=45-50% and DD...  ~  repeat  Myoview in 9/08 showed no ischemia... ~  hosp 10/09 w/ CP- cath showed 90%proxLAD, normCIRC, subtotal midRCA, LIMA & 2 vein grafts patent;  LVF= 60% w/o regional wall motion abn... no change from 2004. ~  repeat cath 6/11 showed no change from 2009 and patent grafts... ~  Labs 6/13 look good & BNP= 179... ~  [facility] 9/13 w/ anemia & GI bleed > labs reviewed; DrKelly incr her Metoprolol to 75mg  Bid... ~  [facility] 12/13 by DrACollins for left TKR... ~  7/14: on ASA81, Metop50-1/2Bid, Imdur30, Lasix40Bid, K20Bid; BP= 150/70 & she denies CP, palpit, ch in SOB/ edema... ~  2/15:  on ASA81, Metop50-1/2Bid, Imdur30, Lasix40Bid, K20Bid; BP= 130/70 & still denies symptoms... ~  subseq hosp 2/15- 09/07/13 by [facility] w/ SOB, cough/sput, neg CXR, neg cultures, pos PCT~26, & elev BNP~14000; she was diuresed, & disch on the same Lasix40Bid... ~  3/15: on Imdur30, Metoprolol50Bid, Lasix40Bid, KCl20Bid, Coumadin; [facility] & their notes are reviewed; BP=120/84 & BNP was up to 14000 & improved w/ diuresis- now 203 & we continued the Lasix40Bid.  ATRIAL FIBRILLATION (ICD-427.31) - on COUMADIN per DrBerry... ~  3/41: complic after epidural shot w/ large ecchymosis right lumbar area, PT INR was >6, managed by [facility]. ~  9/13:  Hosp by [facility] w/ anemia (Hg=7.3), pos stools, INR=2.19; Coumadin held transiently, Tx 2u +FFP & VitK; EGD & Colon neg; Coumadin restarted; DrKelly increased her Metoprolol to 75mg Bid... ~  EKG 4/14 showed AFib, rate93, NSSTTWA...  ~  EKG 2/15 hosp showed AFib, rate125, NSSTTWA...  VENOUS INSUFFICIENCY (ICD-459.81) - she follows low sodium diet. elevates legs, wears support hose occas... she is on LASIX 40mg Bid per Cards... ~  9/12: [name] ordered VenDopplers- neg for DVT...  HYPERLIPIDEMIA (ICD-272.4) - on ZOCOR 20mg /d... ~  [place] 2/09 showed TChol 112, TG 76, HDL 43, LDL 54 ~  FLP 3/10 showed TChol 166, TG 137, HDL 40, LDL 98 ~  FLP 10/10 showed TChol 120, Tg 91, HDL 47, LDL 55 ~   12/11 & 6/12: not fasting for today's OV's... ~  4/12: [facility] from [facility] showed TChol 99, TG 68, HDL 43, LDL 42 on Simva20. ~  FLP 1/13 on Simva20 showed TChol 110, TG 98, HDL 47, LDL 43 ~  FLP on 6/13 Simva20 showed TChol 104, TG 96, HDL 50, LDL 35 ~  FLP 3/14 on Simva20 showed TChol 81, TG 85, HDL 34, LDL 30  MORBID OBESITY (ICD-278.01) - we have discussed diet and exercise program on numerous occas in the past and again today! ~  weight 2/10 = 250# ~  weight 7/10 = 262# ~  weight 8/10 = 245# ~  weight 12/10 = 266# "it's a fighting battle" ~  weight 4/11 = 262# ~  weight 8/11 = 256# ~  weight 12/11 = 263#... we reviewed diet + exercise thereapy. ~  Weight 6/12 = 271# ~  Weight 1/13 = 266# ~  Weight 6/13 = 255# ~  Weight 11/13 = 245# ~  Weight 4/14 = 256# ~  Weight 7/14 = 247# ~  Weight 11/14 =  218# ~  Weight 2/15 = 215# ~  Weight 3/15 = 206#  GERD (ICD-530.81) - on OMEPRAZOLE 20mg Bid... ?[facility] changed to Aciphex? ~  EGD 9/13 by DrHung showed intact Nissen fundoplication, normal esoph, gastric mucosa, and duod  IBS (ICD-564.1) - colonoscopy was 9/07 by Demetra Shiner and showed hems only... 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 ~  Colonoscopy 9/13 by DrStark was normal- no divertics, AVMs, polyps, or other lesions... ~  11/14: presents w/ LLQ pain & sl tenderness x several weeks- denies n/v, +constip going 1-2 per wk and assoc w/ fair appetite and 29# weight loss; GI hx as noted; She had CT Pelvis 9/14 in ER w/ avulsion fracture of the greater trochanter of the proximal right femur;  Exam shows tender to palp in LLQ, no mass felt => we decided to check labs and proceed w/ CT Abd => NEG x for 3cm cystic lesion in left inguinal region => refer to [facility] (she never went). ~  3/15: Hx GERD-s/p Nissen on Prilosec20; Hx IBS on Colase & GasX;, s/p GB & Hyst; & 3cm lesion left inguinal area seen on CT 11/14> needs re-imagining now...  RENAL INSUFFICIENCY (ICD-588.9) - Creat in the  1.6-1.7 range... ~  labs 2/09 showed BUN= 16, Creat= 1.6 ~  labs 3/10 showed BUN= 20, Creat= 1.7 ~  labs 4/11 showed BUN= 15, Creat= 1.6 ~  labs in hosp 6/11 showed BUN= 9, Creat= 1.3 ~  Labs here 6/12 showed BUN= 21, Creat= 1.6 ~  Labs 1/13 on Lasix40Bid showed BUN=15, Creat= 1.7 ~  Labs 6/13 on Lasix40Bid showed BUN= 21, Creat= 1.7 ~  Labs 9/13 in hosp showed Creat= 1.2-1.3 ~  Labs 11/13 on Lasix40Bid showed BUN= 25, Creat= 1.8 ~  Labs 3/14 on Lasix40Bid+K20Bid showed K=5.1, BUN=22, Cr=1.7 ~  Labs 11/14 on Lasix40Bid+K20Bid showed K=4.8, Cr=1.3 ~  Labs 12/14 on same meds showed Cr=1.6  OTHER SYMPTOMS INVOLVING URINARY SYSTEM (ICD-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 she responded to [place] 8mg  for overact bladder symptoms==> no longer on her med list...  DEGENERATIVE JOINT DISEASE >> LOW BACK PAIN, CHRONIC - eval and treated by Rayford Halsted, now DrBeekman> "they do it all now" w/ rechecks Q3-95months... she takes OXYCODONE 5/325, NEURONTIN 600mg Bid, etc... COMPRESSION FRACTURE - found to have compression T12 & L2 4/11 treated by NS- DrTooke... CHRONIC PAIN SYNDROME - managed by Renaee Munda... FIBROMYALGIA - on AMITRIPTYLINE 50mg - 2Qhs & FLEXERIL 10mg  Tid... PAGET'S DISEASE - on Observation per [facility], Rheumatology... ~  labs 2/09 showed AlkPhos= 79 ~  labs 3/10 showed AlkPhos= 86 ~  labs 4/11 showed AlkPhos= 70 ~  labs 6/11 in hosp showed AlkPhos = 71 ~  Bone Scan 6/11 showed stable Paget's distal left tibia, compress fx L2, OA of both knees. ~  Labs 6/13 showed AlkPhos= 86, remains wnl... ~  Labs 3/14 showed AlkPhos= 108 ~  We do not have notes from Rheum to review...  SYNCOPE >> Adm 4/14 by [facility]- assoc w/ postural change by history & UTI via ER... ~  4/14:  CT Head in ER showed mild diffuse atrophy & vol loss w/ microvasc ischemic dis, no acute intracranial process... ~  4/14:  No postural BP changes in office today; she  has been treated for UTI found in ER last week; we will order MRI & CDopplers; she will f/u w/ Cards... 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    ANXIETY - on ALPRAZOLAM 0.5mg Qid prn...  Hx of HERPES ZOSTER (ICD-053.9), & POSTHERPETIC NEURALGIA (ICD-053.19)  ANEMIA >>  ~  Labs 11/13 showed Hg=10.7, MCV=92, Fe=88 (21%sat)... ~  Labs 3/14 showed Hg=11.4, MCV=91, Fe=62 (17%sat)... ~  Labs 11/14 showed Hg=10.5, MCV=95 ~  Labs 12/14 showed Hg= 9.5.Marland KitchenMarland Kitchen Continue supplements... ~  Labs 3/15 showed Hg= 10.5, WBC=2.4 w/ 21% neutrophils, 61% lymphs, 12% monos; Fe=58 (18%sat), Ferritin=107; B12=774; SPE/IEP w/ 2 M-spikes and IgA lambda paraprot w/ marked incr IGA level & depressed IgM=> PT REFERRED TO [facility] FOR THEIR REVIEW AND CONSULTATION...    Past Surgical History  Procedure Laterality Date  . Vesicovaginal fistula closure w/ tah    . Cardiac catheterization  01/06/2010    RCA mid artery stenosis at 99%, LAD proximal occlusion 90%  at origin of ramus  . Nissen fundoplication    . Esophagogastroduodenoscopy  04/09/2012    Procedure: ESOPHAGOGASTRODUODENOSCOPY (EGD);  Surgeon: Beryle Beams, MD;  Location[facility] ENDOSCOPY;  Service: Endoscopy;  Laterality: N/A;  tentatively scheduled per Trula Slade due to patients breathing problems  . Colonoscopy  04/11/2012    Procedure: COLONOSCOPY;  Surgeon: Ladene Artist, MD,FACG;  Location: [facility] ENDOSCOPY;  Service: Endoscopy;  Laterality: N/A;  . Abdominal hysterectomy  at 73 years old  . Appendectomy  about 50 years ago  . Coronary artery bypass graft  1997    LIMA to LAD, SVG to ramus, SVG to RCA  . Cholecystectomy  50 years ago  . Neck surgery  73 years old    full movement  . Right knee  arthroscopy  3 years ago  . Eye surgery  2005    cataracts both eyes  . Back surgery      x5, "birdcage" and fused in lower back  . Total knee arthroplasty  06/28/2012    Procedure: TOTAL KNEE ARTHROPLASTY;  Surgeon: Sydnee Cabal, MD;  Location: [facility] ORS;  Service: Orthopedics;  Laterality: Left;  . Joint replacement Left 06/28/12    knee replacement    Outpatient Encounter Prescriptions as of 09/21/2013  Medication Sig  . albuterol (PROVENTIL) (2.5 MG/3ML) 0.083% nebulizer solution Take 2.5 mg by nebulization 4 (four) times daily as needed for wheezing or shortness of breath.  . ALPRAZolam (XANAX) 0.5 MG tablet Take 0.25-0.5 mg by mouth 4 (four) times daily as needed for anxiety.  Marland Kitchen amitriptyline (ELAVIL) 50 MG tablet Take 100 mg by mouth at bedtime.  Marland Kitchen aspirin EC 81 MG tablet Take 81 mg by mouth every evening.   . budesonide-formoterol (SYMBICORT) 160-4.5 MCG/ACT inhaler Inhale 2 puffs into the lungs 2 (two) times daily.  . Calcium Carbonate-Vitamin D (CALCIUM-VITAMIN D) 500-200 MG-UNIT per tablet Take 1 tablet by mouth 2 (two) times daily with a meal.  . docusate sodium (COLACE) 100 MG capsule Take 200 mg by mouth at bedtime.  . ferrous sulfate 325 (65 FE) MG tablet Take 325 mg by mouth every evening.  . furosemide (LASIX) 40 MG tablet Take 40 mg by mouth 2 (two) times daily.   Marland Kitchen gabapentin (NEURONTIN) 600 MG tablet Take 600 mg by mouth 4 (four) times daily.  Marland Kitchen guaiFENesin (MUCINEX) 600 MG 12 hr tablet Take 1,200 mg by mouth 2 (two) times daily.  . isosorbide mononitrate (  IMDUR) 30 MG 24 hr tablet Take 1 tablet (30 mg total) by mouth at bedtime.  . metoprolol (LOPRESSOR) 50 MG tablet Take 1 tablet (50 mg total) by mouth 2 (two) times daily.  . Multiple Vitamin (MULTIVITAMIN WITH MINERALS) TABS Take 1 tablet by mouth daily.  . nitroGLYCERIN (NITROSTAT) 0.4 MG SL tablet Place 0.4 mg under the tongue every 5 (five) minutes as needed for chest pain.  Marland Kitchen omeprazole (PRILOSEC) 20 MG capsule  Take 20 mg by mouth 2 (two) times daily.  . ondansetron (ZOFRAN) 4 MG tablet Take 4 mg by mouth every 8 (eight) hours as needed for nausea or vomiting.  Marland Kitchen oxyCODONE-acetaminophen (PERCOCET/ROXICET) 5-325 MG per tablet Take 2 tablets by mouth every 4 (four) hours as needed for pain.  . potassium chloride SA (K-DUR,KLOR-CON) 20 MEQ tablet Take 20 mEq by mouth 2 (two) times daily.  . Simethicone (GAS-X PO) Take 1 tablet by mouth daily as needed (for gas).  . simvastatin (ZOCOR) 20 MG tablet Take 20 mg by mouth at bedtime.  Marland Kitchen warfarin (COUMADIN) 5 MG tablet Take 2.5-5 mg by mouth See admin instructions. Take 0.5 tablet Monday, Wednesday and Friday Take 1 tablet Tuesday, Thursday, Saturday and Sunday  . [DISCONTINUED] chlorpheniramine-HYDROcodone (TUSSIONEX PENNKINETIC ER) 10-8 MG/5ML LQCR Take 5 mLs by mouth every 12 (twelve) hours as needed for cough.  . [DISCONTINUED] fluconazole (DIFLUCAN) 100 MG tablet Take 2 today  Then 1 daily until gone.  . [DISCONTINUED] levofloxacin (LEVAQUIN) 750 MG tablet Take 1 tablet (750 mg total) by mouth daily.  . [DISCONTINUED] promethazine (PHENERGAN) 25 MG tablet Take 25 mg by mouth every 6 (six) hours as needed for nausea.    No Known Allergies   Current Medications, Allergies, Past Medical History, Past Surgical History, Family History, and Social History were reviewed in [employer] record.    Review of Systems         See HPI - all other systems neg except as noted... The patient complains of decreased hearing, dyspnea on exertion, LLQ abd pain, headaches, incontinence, and depression.  The patient denies anorexia, fever, weight loss, weight gain, vision loss, hoarseness, chest pain, syncope, peripheral edema, prolonged cough, hemoptysis, melena, hematochezia, severe indigestion/heartburn, hematuria, muscle weakness, suspicious skin lesions, transient blindness, difficulty walking, unusual weight change, abnormal bleeding, enlarged  lymph nodes, and angioedema.    Objective:   Physical Exam     WD, Obese, 73 y/o WF in NAD... GENERAL:  Alert & oriented; pleasant & cooperative... HEENT:  [place]/AT, EOM- full, PERRLA, EACs-clear, TMs-wnl, NOSE-clear, THROAT-clear & wnl. NECK:  Supple w/ fairROM; no JVD; normal carotid impulses w/o bruits; no thyromegaly or nodules palpated; no lymphadenopathy. CHEST:  Coarse BS w/ no wheezing, no rales, no signs of consolidation... HEART:  Regular Rhythm; without murmurs/ rubs/ or gallops heard... ABDOMEN:  Obese, soft & nontender; normal bowel sounds, no organomegaly or masses detected, no inguinal lesion palpated. EXT: without deformities, mild arthritic changes; no varicose veins/ +venous insuffic/ 1+ edema. NEURO:  no focal neuro deficits... DERM:  along right lateral chest wall, under breast & over the costal margin- scarring in skin from shingles rash...  RADIOLOGY DATA:  Reviewed in the EPIC EMR & discussed w/ the patient...  LABORATORY DATA:  Reviewed in the EPIC EMR & discussed w/ the patient...   Assessment & Plan:    COPD>  Stable overall despite her chronic symptoms> continue NEBS, Mucinex, Fluids;  PFTs look more restricted than obstructed & she is advised  to lose wt & concentrate on good deep breaths... 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   ASHD/ CAD/ s/p CABG>  Followed by Cyndra [facility]; continue same meds; needs cardiac w/u for poss causes of Syncope...  AFIB>  On Coumadin per [facility], continue same Rx...  HYPERLIPIDEMIA>  FLP looks great, continue Simva20...  OBESITY>  We reviewed diet & exercise program; weight is coming down but she's not sure why?  GI Bleed>  Hosp 9/13 w/ Anemia & GI bleed; neg EGD & Colon, no source found... LLQ Abd pain  ?etiology> she has fair appetite, 29# wt loss over 90mo, and we are checking CT Abd => NEG x for 3cm cystic lesion in left inguinal region => she neve went to [facility] & we need to re-image this area...  Renal Insuffic>  Creat= 1.4-1.6 on her current regimen; advised to drink plenty of water & maintain hydration...  ORTHO>  LBP/ Compression fx/ FM/ Paget's/ Chr Pain Syndrome> managed by [name] at [facility] he took over for [facility]; we discussed checking w/ DrBeekman before considering Ortho surg...  Anxiety>  On Alpraz prn...  Anemia, Neutropenia, Pancytopenia>  She was Tx 2u in hosp 9/13; Labs showed Hg= 9.5; continue supplements... Noted to be Neutropenic 2/15 hosp & f/u lab w/ WBC=2400 w/ 21% Neutrophils (new prob) & may need Heme referral... 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   Patient's Medications  New Prescriptions   FLUTICASONE-SALMETEROL (ADVAIR DISKUS) 250-50 MCG/DOSE AEPB    Inhale 1 puff into the lungs 2 (two) times daily.   NORTRIPTYLINE (PAMELOR) 50 MG CAPSULE    Take 1 capsule (50 mg total) by mouth at bedtime.  Previous Medications   ALPRAZOLAM (XANAX) 0.5 MG TABLET    Take 0.25-0.5 mg by mouth 4 (four) times daily as needed for anxiety.   ASPIRIN EC 81 MG TABLET    Take 81 mg by mouth every evening.    CALCIUM CARBONATE-VITAMIN D (CALCIUM-VITAMIN D) 500-200 MG-UNIT PER TABLET    Take 1 tablet by mouth 2 (two) times daily with a meal.   DOCUSATE SODIUM (COLACE) 100 MG CAPSULE    Take 200 mg by mouth at bedtime.   FERROUS SULFATE 325 (65 FE) MG TABLET    Take 325 mg by mouth every evening.   FUROSEMIDE (LASIX) 40 MG TABLET    Take 40 mg by mouth 2 (two) times daily.    GABAPENTIN (NEURONTIN) 600 MG TABLET    Take 600 mg by mouth 4 (four) times daily.   GUAIFENESIN (MUCINEX) 600 MG 12 HR TABLET    Take 1,200 mg by mouth 2 (two) times daily.   ISOSORBIDE MONONITRATE (IMDUR) 30 MG 24 HR TABLET    Take  1 tablet (30 mg total) by mouth at bedtime.   METOPROLOL (LOPRESSOR) 50 MG TABLET    Take 1 tablet (50 mg total) by mouth 2 (two) times daily.   MULTIPLE VITAMIN (MULTIVITAMIN WITH MINERALS) TABS    Take 1 tablet by mouth daily.   NITROGLYCERIN (NITROSTAT) 0.4 MG SL TABLET    Place 0.4 mg under the tongue every 5 (five) minutes as needed for chest pain.   OMEPRAZOLE (PRILOSEC) 20 MG CAPSULE  Take 20 mg by mouth 2 (two) times daily.   ONDANSETRON (ZOFRAN) 4 MG TABLET    Take 4 mg by mouth every 8 (eight) hours as needed for nausea or vomiting.   OXYCODONE-ACETAMINOPHEN (PERCOCET/ROXICET) 5-325 MG PER TABLET    Take 2 tablets by mouth every 4 (four) hours as needed for pain.   POTASSIUM CHLORIDE SA (K-DUR,KLOR-CON) 20 MEQ TABLET    Take 20 mEq by mouth 2 (two) times daily.   SIMETHICONE (GAS-X PO)    Take 1 tablet by mouth daily as needed (for gas).   SIMVASTATIN (ZOCOR) 20 MG TABLET    Take 20 mg by mouth at bedtime.   WARFARIN (COUMADIN) 5 MG TABLET    Take 2.5-5 mg by mouth See admin instructions. Take 0.5 tablet Monday, Wednesday and Friday Take 1 tablet Tuesday, Thursday, Saturday and Sunday  Modified Medications   Modified Medication Previous Medication   ALBUTEROL (PROVENTIL) (2.5 MG/3ML) 0.083% NEBULIZER SOLUTION albuterol (PROVENTIL) (2.5 MG/3ML) 0.083% nebulizer solution      Take 3 mLs (2.5 mg total) by nebulization 4 (four) times daily as needed for wheezing or shortness of breath.    Take 2.5 mg by nebulization 4 (four) times daily as needed for wheezing or shortness of breath.  Discontinued Medications   AMITRIPTYLINE (ELAVIL) 50 MG TABLET    Take 100 mg by mouth at bedtime.   BUDESONIDE-FORMOTEROL (SYMBICORT) 160-4.5 MCG/ACT INHALER    Inhale 2 puffs into the lungs 2 (two) times daily.   CHLORPHENIRAMINE-HYDROCODONE (TUSSIONEX PENNKINETIC ER) 10-8 MG/5ML LQCR    Take 5 mLs by mouth every 12 (twelve) hours as needed for cough.   FLUCONAZOLE (DIFLUCAN) 100 MG TABLET    Take 2 today   Then 1 daily until gone.   LEVOFLOXACIN (LEVAQUIN) 750 MG TABLET    Take 1 tablet (750 mg total) by mouth daily.   PROMETHAZINE (PHENERGAN) 25 MG TABLET    Take 25 mg by mouth every 6 (six) hours as needed for nausea.

## 2013-09-21 NOTE — Patient Instructions (Signed)
Today we updated your med list in our EPIC system...    Continue your current medications the same...  We changed your Symbicort to ADVAIR discus- one inhalation twice daily as directed...  We also changed your Amitriptyline to Nortriptyline (50mg  one tab at bedtime) to save you money...  Today we checked f/u blood work=> We will contact you w/ the results when available...   Be diligent w/ your breathing treatments and Mucinex Rx=> try to expectorate the phlegm as best you can...  Call for any questions...  Let's plan a follow up visit in 97month.Marland KitchenMarland Kitchen

## 2013-09-22 LAB — PROCALCITONIN: Procalcitonin: 0.27

## 2013-09-25 LAB — PROTEIN ELECTROPHORESIS, SERUM, WITH REFLEX
ALBUMIN ELP: 51.1 % — AB (ref 55.8–66.1)
Alpha-1-Globulin: 5.5 % — ABNORMAL HIGH (ref 2.9–4.9)
Alpha-2-Globulin: 8.7 % (ref 7.1–11.8)
BETA 2: 16.5 % — AB (ref 3.2–6.5)
BETA GLOBULIN: 5.7 % (ref 4.7–7.2)
GAMMA GLOBULIN: 12.5 % (ref 11.1–18.8)
M-Spike, %: 0.78 g/dL
Total Protein, Serum Electrophoresis: 8 g/dL (ref 6.0–8.3)

## 2013-09-25 LAB — IFE INTERPRETATION

## 2013-09-26 LAB — IGG, IGA, IGM
IGA: 1150 mg/dL — AB (ref 69–380)
IgG (Immunoglobin G), Serum: 1230 mg/dL (ref 690–1700)
IgM, Serum: 39 mg/dL — ABNORMAL LOW (ref 52–322)

## 2013-09-27 ENCOUNTER — Ambulatory Visit: Payer: Medicare Other | Admitting: Pharmacist Clinician (PhC)/ Clinical Pharmacy Specialist

## 2013-09-27 ENCOUNTER — Telehealth: Payer: Self-pay | Admitting: Pulmonary Disease

## 2013-09-27 DIAGNOSIS — D61818 Other pancytopenia: Secondary | ICD-10-CM

## 2013-09-27 NOTE — Telephone Encounter (Signed)
Result Note     Please notify patient>     CBC shows pancytopenia & further investigation reveals abnormal serum protein w/ IgA band on SPE/IEP => she needs HEME referral now for this bone marrow problem...    Iron & B12 are ok- continue current meds the same...    Pt aware of results and referral made.

## 2013-09-28 ENCOUNTER — Telehealth: Payer: Self-pay | Admitting: Internal Medicine

## 2013-09-28 LAB — ALPHA-1 ANTITRYPSIN PHENOTYPE: A1 ANTITRYPSIN: 202 mg/dL — AB (ref 83–199)

## 2013-09-28 NOTE — Telephone Encounter (Signed)
S/W PATIENT AND GAVE NEW PATIENT APPT FOR 03/16 @ 1:30 W/DR. MOHAMED.  REFERRING DR. Phelps PACKET MAILED.

## 2013-09-28 NOTE — Telephone Encounter (Signed)
C/D 09/28/13 for appt. 10/02/13

## 2013-09-29 ENCOUNTER — Other Ambulatory Visit: Payer: Self-pay | Admitting: Pulmonary Disease

## 2013-09-29 DIAGNOSIS — D638 Anemia in other chronic diseases classified elsewhere: Secondary | ICD-10-CM

## 2013-09-29 DIAGNOSIS — E785 Hyperlipidemia, unspecified: Secondary | ICD-10-CM

## 2013-09-29 DIAGNOSIS — G894 Chronic pain syndrome: Secondary | ICD-10-CM

## 2013-10-02 ENCOUNTER — Other Ambulatory Visit: Payer: Self-pay | Admitting: Internal Medicine

## 2013-10-02 ENCOUNTER — Encounter: Payer: Self-pay | Admitting: Internal Medicine

## 2013-10-02 ENCOUNTER — Telehealth: Payer: Self-pay | Admitting: Internal Medicine

## 2013-10-02 ENCOUNTER — Ambulatory Visit (HOSPITAL_BASED_OUTPATIENT_CLINIC_OR_DEPARTMENT_OTHER): Payer: Medicare Other | Admitting: Internal Medicine

## 2013-10-02 ENCOUNTER — Ambulatory Visit (HOSPITAL_BASED_OUTPATIENT_CLINIC_OR_DEPARTMENT_OTHER): Payer: Medicare Other

## 2013-10-02 ENCOUNTER — Ambulatory Visit: Payer: Medicare Other

## 2013-10-02 VITALS — BP 132/68 | HR 92 | Temp 97.3°F | Resp 19 | Ht 67.5 in | Wt 199.5 lb

## 2013-10-02 DIAGNOSIS — D72819 Decreased white blood cell count, unspecified: Secondary | ICD-10-CM

## 2013-10-02 DIAGNOSIS — R5383 Other fatigue: Secondary | ICD-10-CM

## 2013-10-02 DIAGNOSIS — M549 Dorsalgia, unspecified: Secondary | ICD-10-CM

## 2013-10-02 DIAGNOSIS — R5381 Other malaise: Secondary | ICD-10-CM

## 2013-10-02 DIAGNOSIS — D539 Nutritional anemia, unspecified: Secondary | ICD-10-CM | POA: Insufficient documentation

## 2013-10-02 DIAGNOSIS — D61818 Other pancytopenia: Secondary | ICD-10-CM

## 2013-10-02 DIAGNOSIS — D649 Anemia, unspecified: Secondary | ICD-10-CM

## 2013-10-02 DIAGNOSIS — D709 Neutropenia, unspecified: Secondary | ICD-10-CM

## 2013-10-02 DIAGNOSIS — M25519 Pain in unspecified shoulder: Secondary | ICD-10-CM

## 2013-10-02 LAB — CBC WITH DIFFERENTIAL/PLATELET
BASO%: 1 % (ref 0.0–2.0)
Basophils Absolute: 0 10*3/uL (ref 0.0–0.1)
EOS ABS: 0.1 10*3/uL (ref 0.0–0.5)
EOS%: 2.5 % (ref 0.0–7.0)
HCT: 32 % — ABNORMAL LOW (ref 34.8–46.6)
HGB: 10 g/dL — ABNORMAL LOW (ref 11.6–15.9)
LYMPH#: 1 10*3/uL (ref 0.9–3.3)
LYMPH%: 48.3 % (ref 14.0–49.7)
MCH: 32.7 pg (ref 25.1–34.0)
MCHC: 31.3 g/dL — AB (ref 31.5–36.0)
MCV: 104.6 fL — ABNORMAL HIGH (ref 79.5–101.0)
MONO#: 0.3 10*3/uL (ref 0.1–0.9)
MONO%: 12.8 % (ref 0.0–14.0)
NEUT%: 35.4 % — ABNORMAL LOW (ref 38.4–76.8)
NEUTROS ABS: 0.7 10*3/uL — AB (ref 1.5–6.5)
NRBC: 2 % — AB (ref 0–0)
Platelets: 188 10*3/uL (ref 145–400)
RBC: 3.06 10*6/uL — AB (ref 3.70–5.45)
RDW: 20.7 % — ABNORMAL HIGH (ref 11.2–14.5)
WBC: 2 10*3/uL — AB (ref 3.9–10.3)

## 2013-10-02 LAB — COMPREHENSIVE METABOLIC PANEL (CC13)
ALBUMIN: 3.9 g/dL (ref 3.5–5.0)
ALT: 12 U/L (ref 0–55)
AST: 20 U/L (ref 5–34)
Alkaline Phosphatase: 86 U/L (ref 40–150)
Anion Gap: 12 mEq/L — ABNORMAL HIGH (ref 3–11)
BUN: 15 mg/dL (ref 7.0–26.0)
CALCIUM: 9.7 mg/dL (ref 8.4–10.4)
CHLORIDE: 102 meq/L (ref 98–109)
CO2: 24 mEq/L (ref 22–29)
CREATININE: 1.4 mg/dL — AB (ref 0.6–1.1)
Glucose: 97 mg/dl (ref 70–140)
POTASSIUM: 3.8 meq/L (ref 3.5–5.1)
Sodium: 138 mEq/L (ref 136–145)
TOTAL PROTEIN: 7.7 g/dL (ref 6.4–8.3)
Total Bilirubin: 1.02 mg/dL (ref 0.20–1.20)

## 2013-10-02 LAB — IRON AND TIBC CHCC
%SAT: 36 % (ref 21–57)
IRON: 91 ug/dL (ref 41–142)
TIBC: 253 ug/dL (ref 236–444)
UIBC: 162 ug/dL (ref 120–384)

## 2013-10-02 LAB — TECHNOLOGIST REVIEW

## 2013-10-02 LAB — LACTATE DEHYDROGENASE (CC13): LDH: 175 U/L (ref 125–245)

## 2013-10-02 LAB — FERRITIN CHCC: FERRITIN: 167 ng/mL (ref 9–269)

## 2013-10-02 NOTE — Progress Notes (Signed)
[place] [phone]   [phone]  CONSULT NOTE  REFERRING PHYSICIAN: Dr. Teressa Lower  REASON FOR CONSULTATION:  73 years old white female with pancytopenia  HPI [name] is a 73 y.o. female with past medical history significant for multiple medical problems including history of coronary artery disease status post CABG, dyslipidemia, COPD, GERD, and degenerative joint disease, compression fracture, peripheral vascular disease, atrial fibrillation with rapid ventricular rate, currently on Coumadin, anemia, pneumonia and history of chronic kidney disease. The patient was seen recently by her primary care physician Dr. Lenna Gilford and CBC performed on 09/21/2013 showed low white blood count of 2.4 with absolute neutrophil count of 500. The patient also has low hemoglobin of 10.5, hematocrit 32.6% and platelets count 146,000. She was referred to me today for evaluation and recommendation regarding her pancytopenia. The patient mentions that she was recently admitted to [facility] on 09/03/2013 with shortness of breath and questionable pneumonia as well as sepsis. She was treated with Levaquin. She did not have any significant change in her medication except for increasing the dose of metoprolol to 50 mg by mouth twice a day. The patient has normal white blood count of 7.1 with absolute neutrophil count of 5900 on the day of her admission on 09/03/2013. On 09/07/2048 in her total red blood count dropped to 3.3 and hemoglobin was down to 8.3 G./DL with platelets count of 114,000.  The patient denied having any bleeding issues, any current fever or chills. She has been on Zocor for several years. She does not take any over the counter medications.  She continues to complain of lack of energy as well as pain in her legs especially at night time. She also has low back pain and shoulder pain. She has a history of fibromyalgia.  Her family history  significant for a father and sister with COPD. Her mother has diabetes and coronary artery disease. The patient is married and has 3 children. She used to work as a Theme park manager. She has remote history of smoking but quit 30 years ago and no history of alcohol or drug abuse. HPI  Past Medical History  Diagnosis Date  . Coronary atherosclerosis of unspecified type of vessel, native or graft   . Atrial fibrillation 01/19/2012    stress test - non-gated secondary to A fib,  normal myocaridal perfusion study  . Unspecified venous (peripheral) insufficiency   . Other and unspecified hyperlipidemia   . Morbid obesity   . Esophageal reflux   . Irritable bowel syndrome   . Other symptoms involving urinary system(788.99)   . Chronic pain syndrome   . 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 Anxiety state, unspecified   . Herpes zoster without mention of complication   . Herpes zoster with other nervous system complications(053.19)   . Lumbago   . Anticoagulated on Coumadin, chronic for A. Fib 04/08/2012  . COPD (chronic obstructive pulmonary disease)   . 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 Pneumonia 4 years ago  . Bronchitis, chronic   . Fibromyalgia   . Arthritis   . CAD (coronary artery disease) 04/29/2006    echo -EF 45-50%, impaired LV relaxation  . 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 GI bleed 04/07/2012    endoscopy/colonoscopy unremarkable  . 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 DJD (degenerative joint disease)   . Myocardial infarction   .  Anemia     Past Surgical History  Procedure Laterality Date  . Vesicovaginal fistula closure w/ tah    . Cardiac catheterization  01/06/2010    RCA mid artery stenosis at 99%, LAD proximal occlusion 90%  at origin of ramus  . Nissen fundoplication    . Esophagogastroduodenoscopy  04/09/2012    Procedure: ESOPHAGOGASTRODUODENOSCOPY (EGD);  Surgeon: Theda Belfast, MD;  Location[facility] ENDOSCOPY;  Service: Endoscopy;  Laterality: N/A;  tentatively scheduled per Blase Mess due to patients breathing problems  . Colonoscopy  04/11/2012    Procedure: COLONOSCOPY;  Surgeon: Meryl Dare, MD,FACG;  Location: [facility] ENDOSCOPY;  Service: Endoscopy;  Laterality: N/A;  . Abdominal hysterectomy  at 73 years old  . Appendectomy  about 50 years ago  . Coronary artery bypass graft  1997    LIMA to LAD, SVG to ramus, SVG to RCA  . Cholecystectomy  50 years ago  . Neck surgery  73 years old    full movement  . Right knee arthroscopy  3 years ago  . Eye surgery  2005    cataracts both eyes  . Back surgery      x5, "birdcage" and fused in lower back  . Total knee arthroplasty  06/28/2012    Procedure: TOTAL KNEE ARTHROPLASTY;  Surgeon: Eugenia Mcalpine, MD;  Location: [facility] ORS;  Service: Orthopedics;  Laterality: Left;  . Joint replacement Left 06/28/12    knee replacement    Family History  Problem Relation Age of Onset  . Heart disease Mother   . Lung disease Father   . Kidney disease      Social History History  Substance Use Topics  . Smoking status: Former Smoker -- 1.00 packs/day for 22 years    Types: Cigarettes    Quit date: 07/20/1984  . Smokeless tobacco: Never Used  . Alcohol Use: No    No Known Allergies  Current Outpatient Prescriptions  Medication Sig Dispense Refill  . albuterol (PROVENTIL) (2.5 MG/3ML) 0.083% nebulizer solution Take 3 mLs (2.5 mg total) by nebulization 4 (four) times daily as needed for wheezing or shortness of breath.  360 mL  4  . ALPRAZolam (XANAX) 0.5 MG tablet Take 0.25-0.5 mg by mouth 4 (four) times daily as needed for anxiety.      Marland Kitchen aspirin EC 81 MG tablet Take 81 mg by mouth every evening.       . Calcium Carbonate-Vitamin D (CALCIUM-VITAMIN D) 500-200 MG-UNIT per tablet Take 1 tablet by mouth 2 (two) times daily with a meal.      . docusate sodium (COLACE) 100 MG capsule Take 200 mg by mouth at bedtime.        . ferrous sulfate 325 (65 FE) MG tablet Take 325 mg by mouth every evening.      . Fluticasone-Salmeterol (ADVAIR DISKUS) 250-50 MCG/DOSE AEPB Inhale 1 puff into the lungs 2 (two) times daily.  60 each  5  . furosemide (LASIX) 40 MG tablet Take 40 mg by mouth 2 (two) times daily.       Marland Kitchen gabapentin (NEURONTIN) 600 MG tablet Take 600 mg by mouth 4 (four) times daily.      Marland Kitchen guaiFENesin (MUCINEX) 600 MG 12 hr tablet Take 1,200 mg by mouth 2 (two) times daily.      . isosorbide mononitrate (IMDUR) 30 MG 24 hr tablet Take 1 tablet (30 mg total) by mouth at bedtime.  30 tablet  11  . metoprolol (LOPRESSOR) 50 MG  tablet Take 1 tablet (50 mg total) by mouth 2 (two) times daily.  60 tablet  0  . Multiple Vitamin (MULTIVITAMIN WITH MINERALS) TABS Take 1 tablet by mouth daily.      . nitroGLYCERIN (NITROSTAT) 0.4 MG SL tablet Place 0.4 mg under the tongue every 5 (five) minutes as needed for chest pain.      . nortriptyline (PAMELOR) 50 MG capsule Take 1 capsule (50 mg total) by mouth at bedtime.  30 capsule  6  . omeprazole (PRILOSEC) 20 MG capsule Take 20 mg by mouth 2 (two) times daily.      . ondansetron (ZOFRAN) 4 MG tablet Take 4 mg by mouth every 8 (eight) hours as needed for nausea or vomiting.      Marland Kitchen oxyCODONE-acetaminophen (PERCOCET/ROXICET) 5-325 MG per tablet Take 2 tablets by mouth every 4 (four) hours as needed for pain.      . potassium chloride SA (K-DUR,KLOR-CON) 20 MEQ tablet Take 20 mEq by mouth 2 (two) times daily.      . Simethicone (GAS-X PO) Take 1 tablet by mouth daily as needed (for gas).      . simvastatin (ZOCOR) 20 MG tablet Take 20 mg by mouth at bedtime.      Marland Kitchen warfarin (COUMADIN) 5 MG tablet Take 2.5-5 mg by mouth See admin instructions. Take 0.5 tablet Monday, Wednesday and Friday Take 1 tablet Tuesday, Thursday, Saturday and Sunday       No current facility-administered medications for this visit.    Review of Systems  Constitutional: positive for fatigue Eyes:  negative Ears, nose, mouth, throat, and face: negative Respiratory: negative Cardiovascular: negative Gastrointestinal: negative Genitourinary:negative Integument/breast: negative Hematologic/lymphatic: negative Musculoskeletal:negative Neurological: negative Behavioral/Psych: negative Endocrine: negative Allergic/Immunologic: negative  Physical Exam  TFT:DDUKG, healthy, no distress, well nourished and well developed SKIN: skin color, texture, turgor are normal, no rashes or significant lesions HEAD: Normocephalic, No masses, lesions, tenderness or abnormalities EYES: normal, PERRLA EARS: External ears normal, Canals clear OROPHARYNX:no exudate and no erythema  NECK: supple, no adenopathy, no JVD LYMPH:  no palpable lymphadenopathy, no hepatosplenomegaly BREAST:not examined LUNGS: clear to auscultation , and palpation HEART: regular rate & rhythm and no murmurs ABDOMEN:abdomen soft, non-tender, obese, normal bowel sounds and no masses or organomegaly BACK: Back symmetric, no curvature., No CVA tenderness EXTREMITIES:no edema, no skin discoloration, no clubbing  NEURO: alert & oriented x 3 with fluent speech, no focal motor/sensory deficits  PERFORMANCE STATUS: ECOG 1  LABORATORY DATA: Lab Results  Component Value Date   WBC 2.0* 10/02/2013   HGB 10.0* 10/02/2013   HCT 32.0* 10/02/2013   MCV 104.6* 10/02/2013   PLT 188 10/02/2013      Chemistry      Component Value Date/Time   NA 138 10/02/2013 1330   NA 138 09/06/2013 0630   K 3.8 10/02/2013 1330   K 4.2 09/06/2013 0630   CL 104 09/06/2013 0630   CO2 24 10/02/2013 1330   CO2 24 09/06/2013 0630   BUN 15.0 10/02/2013 1330   BUN 29* 09/06/2013 0630   CREATININE 1.4* 10/02/2013 1330   CREATININE 1.45* 09/06/2013 0630      Component Value Date/Time   CALCIUM 9.7 10/02/2013 1330   CALCIUM 8.6 09/06/2013 0630   ALKPHOS 86 10/02/2013 1330   ALKPHOS 85 09/04/2013 0435   AST 20 10/02/2013 1330   AST 25 09/04/2013 0435   ALT 12  10/02/2013 1330   ALT 14 09/04/2013 0435   BILITOT 1.02 10/02/2013 1330  BILITOT 0.9 09/04/2013 0435       RADIOGRAPHIC STUDIES: Dg Chest 2 View  09/06/2013   CLINICAL DATA:  Shortness of breath, cough  EXAM: CHEST - 2 VIEW  COMPARISON:  09/03/2013  FINDINGS: Previous CABG. Heart size upper limits normal. Lungs are clear. Surgical clips in the left upper abdomen. . No effusion.  IMPRESSION: No acute disease post CABG.   Electronically Signed   By: Arne Cleveland M.D.   On: 09/06/2013 18:46   Dg Chest Port 1 View  09/03/2013   CLINICAL DATA:  73 year old female with altered mental status. Initial encounter.  EXAM: PORTABLE CHEST - 1 VIEW  COMPARISON:  [place] Chest CT 08/31/2013 and earlier.  FINDINGS: Stable lung volumes from the recent CT. Sequelae of CABG again noted. Stable cardiac size and mediastinal contours. Visualized tracheal air column is within normal limits. No pneumothorax pulmonary edema or pleural effusion. Stable lung markings including basilar predominant increased interstitial opacity.  IMPRESSION: No acute cardiopulmonary abnormality.   Electronically Signed   By: Lars Pinks M.D.   On: 09/03/2013 05:54    ASSESSMENT: This is a very pleasant 72 years old white female presented with pancytopenia most likely secondary to medication and recent sepsis. Her total white blood count before admission weeks ago was normal. Her thrombocytopenia resolved today, but the patient continues to have leukocytopenia, neutropenia and anemia.   PLAN: I have a lengthy discussion with the patient and her husband today about her current condition. I ordered several studies to evaluate her leukocytopenia and anemia including but not limited to repeat CBC, comprehensive metabolic panel, LDH, iron study, ferritin, serum folate, serum vitamin B12 level, ANA, rheumatoid factor, hepatitis panel and HIV antibody. I also recommended for the patient to proceed with a bone marrow biopsy and aspirate to  rule out any underlying myelodysplastic abnormalities. I would see the patient back for followup visit in 3 weeks for evaluation and discussion of her pending lab and biopsy results. She is currently asymptomatic except for mild fatigue.  She was advised to call me immediately if she has any concerning symptoms in the interval. The patient voices understanding of current disease status and treatment options and is in agreement with the current care plan.  All questions were answered. The patient knows to call the clinic with any problems, questions or concerns. We can certainly see the patient much sooner if necessary.  Thank you so much for allowing me to participate in the care of [facility]. I will continue to follow up the patient with you and assist in her care.  I spent 40 minutes counseling the patient face to face. The total time spent in the appointment was 55 minutes.  Disclaimer: This note was dictated with voice recognition software. Similar sounding words can inadvertently be transcribed and may not be corrected upon review.   Alaysiah Browder K. 10/02/2013, 3:14 PM

## 2013-10-02 NOTE — Telephone Encounter (Signed)
gv and printed appt sched adn avs for pt for april

## 2013-10-03 LAB — ANTI-NUCLEAR AB-TITER (ANA TITER): ANA Titer 1: NEGATIVE

## 2013-10-03 LAB — RHEUMATOID FACTOR: Rhuematoid fact SerPl-aCnc: <10 IU/mL (ref <=14)

## 2013-10-03 LAB — VITAMIN B12: VITAMIN B 12: 787 pg/mL (ref 211–911)

## 2013-10-03 LAB — HIV ANTIBODY (ROUTINE TESTING W REFLEX): HIV: NONREACTIVE

## 2013-10-03 LAB — HEPATITIS PANEL, ACUTE
HCV Ab: NEGATIVE
HEP B C IGM: NONREACTIVE
Hep A IgM: NONREACTIVE
Hepatitis B Surface Ag: NEGATIVE

## 2013-10-03 LAB — FOLATE

## 2013-10-03 LAB — ANA: ANA: POSITIVE — AB

## 2013-10-06 ENCOUNTER — Encounter (HOSPITAL_COMMUNITY): Payer: Self-pay | Admitting: Pharmacy Technician

## 2013-10-09 ENCOUNTER — Other Ambulatory Visit: Payer: Self-pay | Admitting: Radiology

## 2013-10-11 ENCOUNTER — Ambulatory Visit (HOSPITAL_COMMUNITY)
Admission: RE | Admit: 2013-10-11 | Discharge: 2013-10-11 | Disposition: A | Discharge status: Home / Self Care | Payer: Medicare Other | Source: Ambulatory Visit | Attending: Internal Medicine | Admitting: Internal Medicine

## 2013-10-11 ENCOUNTER — Encounter (HOSPITAL_COMMUNITY): Payer: Self-pay

## 2013-10-11 DIAGNOSIS — Z951 Presence of aortocoronary bypass graft: Secondary | ICD-10-CM | POA: Insufficient documentation

## 2013-10-11 DIAGNOSIS — F411 Generalized anxiety disorder: Secondary | ICD-10-CM | POA: Insufficient documentation

## 2013-10-11 DIAGNOSIS — I4891 Unspecified atrial fibrillation: Secondary | ICD-10-CM | POA: Insufficient documentation

## 2013-10-11 DIAGNOSIS — J4489 Other specified chronic obstructive pulmonary disease: Secondary | ICD-10-CM | POA: Insufficient documentation

## 2013-10-11 DIAGNOSIS — B0229 Other postherpetic nervous system involvement: Secondary | ICD-10-CM | POA: Insufficient documentation

## 2013-10-11 DIAGNOSIS — Z7982 Long term (current) use of aspirin: Secondary | ICD-10-CM | POA: Insufficient documentation

## 2013-10-11 DIAGNOSIS — K219 Gastro-esophageal reflux disease without esophagitis: Secondary | ICD-10-CM | POA: Insufficient documentation

## 2013-10-11 DIAGNOSIS — I872 Venous insufficiency (chronic) (peripheral): Secondary | ICD-10-CM | POA: Insufficient documentation

## 2013-10-11 DIAGNOSIS — Z01812 Encounter for preprocedural laboratory examination: Secondary | ICD-10-CM | POA: Insufficient documentation

## 2013-10-11 DIAGNOSIS — I252 Old myocardial infarction: Secondary | ICD-10-CM | POA: Insufficient documentation

## 2013-10-11 DIAGNOSIS — Z96659 Presence of unspecified artificial knee joint: Secondary | ICD-10-CM | POA: Insufficient documentation

## 2013-10-11 DIAGNOSIS — K589 Irritable bowel syndrome without diarrhea: Secondary | ICD-10-CM | POA: Insufficient documentation

## 2013-10-11 DIAGNOSIS — E785 Hyperlipidemia, unspecified: Secondary | ICD-10-CM | POA: Insufficient documentation

## 2013-10-11 DIAGNOSIS — I2510 Atherosclerotic heart disease of native coronary artery without angina pectoris: Secondary | ICD-10-CM | POA: Insufficient documentation

## 2013-10-11 DIAGNOSIS — D539 Nutritional anemia, unspecified: Secondary | ICD-10-CM

## 2013-10-11 DIAGNOSIS — Z9089 Acquired absence of other organs: Secondary | ICD-10-CM | POA: Insufficient documentation

## 2013-10-11 DIAGNOSIS — G894 Chronic pain syndrome: Secondary | ICD-10-CM | POA: Insufficient documentation

## 2013-10-11 DIAGNOSIS — J449 Chronic obstructive pulmonary disease, unspecified: Secondary | ICD-10-CM | POA: Insufficient documentation

## 2013-10-11 DIAGNOSIS — Z87891 Personal history of nicotine dependence: Secondary | ICD-10-CM | POA: Insufficient documentation

## 2013-10-11 DIAGNOSIS — D46Z Other myelodysplastic syndromes: Secondary | ICD-10-CM | POA: Insufficient documentation

## 2013-10-11 DIAGNOSIS — D61818 Other pancytopenia: Secondary | ICD-10-CM

## 2013-10-11 DIAGNOSIS — Z8701 Personal history of pneumonia (recurrent): Secondary | ICD-10-CM | POA: Insufficient documentation

## 2013-10-11 DIAGNOSIS — E8809 Other disorders of plasma-protein metabolism, not elsewhere classified: Secondary | ICD-10-CM | POA: Insufficient documentation

## 2013-10-11 DIAGNOSIS — Z7901 Long term (current) use of anticoagulants: Secondary | ICD-10-CM | POA: Insufficient documentation

## 2013-10-11 DIAGNOSIS — IMO0001 Reserved for inherently not codable concepts without codable children: Secondary | ICD-10-CM | POA: Insufficient documentation

## 2013-10-11 DIAGNOSIS — R509 Fever, unspecified: Secondary | ICD-10-CM | POA: Insufficient documentation

## 2013-10-11 DIAGNOSIS — Z79899 Other long term (current) drug therapy: Secondary | ICD-10-CM | POA: Insufficient documentation

## 2013-10-11 LAB — CBC
HEMATOCRIT: 29.3 % — AB (ref 36.0–46.0)
HEMOGLOBIN: 9.3 g/dL — AB (ref 12.0–15.0)
MCH: 33.3 pg (ref 26.0–34.0)
MCHC: 31.7 g/dL (ref 30.0–36.0)
MCV: 105 fL — AB (ref 78.0–100.0)
Platelets: 99 10*3/uL — ABNORMAL LOW (ref 150–400)
RBC: 2.79 MIL/uL — ABNORMAL LOW (ref 3.87–5.11)
RDW: 21.4 % — AB (ref 11.5–15.5)
WBC: 2.4 10*3/uL — ABNORMAL LOW (ref 4.0–10.5)

## 2013-10-11 LAB — PROTIME-INR
INR: 1.42 (ref 0.00–1.49)
Prothrombin Time: 17 seconds — ABNORMAL HIGH (ref 11.6–15.2)

## 2013-10-11 LAB — BONE MARROW EXAM

## 2013-10-11 LAB — APTT: aPTT: 50 seconds — ABNORMAL HIGH (ref 24–37)

## 2013-10-11 MED ORDER — MIDAZOLAM HCL 2 MG/2ML IJ SOLN
INTRAMUSCULAR | Status: AC
  Filled 2013-10-11: qty 6

## 2013-10-11 MED ORDER — SODIUM CHLORIDE 0.9 % IV SOLN
INTRAVENOUS | Status: DC
  Administered 2013-10-11: 500 mL via INTRAVENOUS

## 2013-10-11 MED ORDER — MIDAZOLAM HCL 2 MG/2ML IJ SOLN
INTRAMUSCULAR | Status: AC | PRN
  Administered 2013-10-11 (×4): 1 mg via INTRAVENOUS

## 2013-10-11 MED ORDER — FENTANYL CITRATE 0.05 MG/ML IJ SOLN
INTRAMUSCULAR | Status: AC
  Filled 2013-10-11: qty 6

## 2013-10-11 MED ORDER — FENTANYL CITRATE 0.05 MG/ML IJ SOLN
INTRAMUSCULAR | Status: AC | PRN
  Administered 2013-10-11 (×2): 50 ug via INTRAVENOUS

## 2013-10-11 MED ORDER — HYDROCODONE-ACETAMINOPHEN 5-325 MG PO TABS
1.0000 | ORAL_TABLET | ORAL | Status: DC | PRN
Start: 2013-10-11 — End: 2013-10-12
  Filled 2013-10-11: qty 2

## 2013-10-11 MED ORDER — MIDAZOLAM HCL 2 MG/2ML IJ SOLN
INTRAMUSCULAR | Status: AC
  Filled 2013-10-11: qty 4

## 2013-10-11 NOTE — Discharge Instructions (Signed)
Moderate Sedation, Adult °Moderate sedation is given to help you relax or even sleep through a procedure. You may remain sleepy, be clumsy, or have poor balance for several hours following this procedure. Arrange for a responsible adult, family member, or friend to take you home. A responsible adult should stay with you for at least 24 hours or until the medicines have worn off. °· Do not participate in any activities where you could become injured for the next 24 hours, or until you feel normal again. Do not: °· Drive. °· Swim. °· Ride a bicycle. °· Operate heavy machinery. °· Cook. °· Use power tools. °· Climb ladders. °· Work at heights. °· Do not make important decisions or sign legal documents until you are improved. °· Vomiting may occur if you eat too soon. When you can drink without vomiting, try water, juice, or soup. Try solid foods if you feel little or no nausea. °· Only take over-the-counter or prescription medications for pain, discomfort, or fever as directed by your caregiver.If pain medications have been prescribed for you, ask your caregiver how soon it is safe to take them. °· Make sure you and your family fully understands everything about the medication given to you. Make sure you understand what side effects may occur. °· You should not drink alcohol, take sleeping pills, or medications that cause drowsiness for at least 24 hours. °· If you smoke, do not smoke alone. °· If you are feeling better, you may resume normal activities 24 hours after receiving sedation. °· Keep all appointments as scheduled. Follow all instructions. °· Ask questions if you do not understand. °SEEK MEDICAL CARE IF:  °· Your skin is pale or bluish in color. °· You continue to feel sick to your stomach (nauseous) or throw up (vomit). °· Your pain is getting worse and not helped by medication. °· You have bleeding or swelling. °· You are still sleepy or feeling clumsy after 24 hours. °SEEK IMMEDIATE MEDICAL CARE IF:   °· You develop a rash. °· You have difficulty breathing. °· You develop any type of allergic problem. °· You have a fever. °Document Released: 03/31/2001 Document Revised: 09/28/2011 Document Reviewed: 03/13/2013 °Exit[employer]® Patient Information ©2014 [employer]. °Bone Marrow Aspiration, Bone Marrow Biopsy °Care After °Read the instructions outlined below and refer to this sheet in the next few weeks. These discharge instructions provide you with general information on caring for yourself after you leave the hospital. Your caregiver may also give you specific instructions. While your treatment has been planned according to the most current medical practices available, unavoidable complications occasionally occur. If you have any problems or questions after discharge, call your caregiver. °FINDING OUT THE RESULTS OF YOUR TEST °Not all test results are available during your visit. If your test results are not back during the visit, make an appointment with your caregiver to find out the results. Do not assume everything is normal if you have not heard from your caregiver or the medical facility. It is important for you to follow up on all of your test results.  °HOME CARE INSTRUCTIONS  °You have had sedation and may be sleepy or dizzy. Your thinking may not be as clear as usual. For the next 24 hours: °· Only take over-the-counter or prescription medicines for pain, discomfort, and or fever as directed by your caregiver. °· Do not drink alcohol. °· Do not smoke. °· Do not drive. °· Do not make important legal decisions. °· Do not operate heavy machinery. °·   Do not care for small children by yourself. °· Keep your dressing clean and dry. You may replace dressing with a bandage after 24 hours. °· You may take a bath or shower after 24 hours. °· Use an ice pack for 20 minutes every 2 hours while awake for pain as needed. °SEEK MEDICAL CARE IF:  °· There is redness, swelling, or increasing pain at the biopsy  site. °· There is pus coming from the biopsy site. °· There is drainage from a biopsy site lasting longer than one day. °· An unexplained oral temperature above 102° F (38.9° C) develops. °SEEK IMMEDIATE MEDICAL CARE IF:  °· You develop a rash. °· You have difficulty breathing. °· You develop any reaction or side effects to medications given. °Document Released: 01/23/2005 Document Revised: 09/28/2011 Document Reviewed: 07/03/2008 °ExitCare® Patient Information ©2014 [employer]. ° °

## 2013-10-11 NOTE — H&P (Signed)
Chief Complaint: "I am here for a bone marrow biopsy." Referring Physician: Dr. Julien Nordmann HPI: [name] is an 73 y.o. female with pancytopenia who presents today for a scheduled bone marrow biopsy. She denies any chest pain, worsening shortness of breath or palpitations. She denies any active signs of bleeding or excessive bruising. She does have a low grade fever today and states she was hospitalized for pneumonia 3 weeks ago and has finished antibiotics, she is feeling much better. The patient denies any history of sleep apnea or chronic oxygen use. She has previously tolerated sedation without complications.   Past Medical History:  Past Medical History  Diagnosis Date  . Coronary atherosclerosis of unspecified type of vessel, native or graft   . Atrial fibrillation 01/19/2012    stress test - non-gated secondary to A fib,  normal myocaridal perfusion study  . Unspecified venous (peripheral) insufficiency   . Other and unspecified hyperlipidemia   . Morbid obesity   . Esophageal reflux   . Irritable bowel syndrome   . Other symptoms involving urinary system(788.99)   . Chronic pain syndrome   . 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 Anxiety state, unspecified   . Herpes zoster without mention of complication   . Herpes zoster with other nervous system complications(053.19)   . Lumbago   . Anticoagulated on Coumadin, chronic for A. Fib 04/08/2012  . COPD (chronic obstructive pulmonary disease)   . 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 Pneumonia 4 years ago  . Bronchitis, chronic   . Fibromyalgia   . Arthritis   . CAD (coronary artery disease) 04/29/2006    echo -EF 45-50%, impaired LV relaxation  . 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 GI bleed 04/07/2012    endoscopy/colonoscopy unremarkable  . 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 DJD (degenerative joint disease)   . Myocardial infarction    . Anemia     Past Surgical History:  Past Surgical History  Procedure Laterality Date  . Vesicovaginal fistula closure w/ tah    . Cardiac catheterization  01/06/2010    RCA mid artery stenosis at 99%, LAD proximal occlusion 90%  at origin of ramus  . Nissen fundoplication    . Esophagogastroduodenoscopy  04/09/2012    Procedure: ESOPHAGOGASTRODUODENOSCOPY (EGD);  Surgeon: Beryle Beams, MD;  Location: [facility] ENDOSCOPY;  Service: Endoscopy;  Laterality: N/A;  tentatively scheduled per Trula Slade due to patients breathing problems  . Colonoscopy  04/11/2012    Procedure: COLONOSCOPY;  Surgeon: Ladene Artist, MD,FACG;  Location: [facility] ENDOSCOPY;  Service: Endoscopy;  Laterality: N/A;  . Abdominal hysterectomy  at 73 years old  . Appendectomy  about 50 years ago  . Coronary artery bypass graft  1997    LIMA to LAD, SVG to ramus, SVG to RCA  . Cholecystectomy  50 years ago  . Neck surgery  73 years old    full movement  . Right knee arthroscopy  3 years ago  . Eye surgery  2005    cataracts both eyes  . Back surgery      x5, "birdcage" and fused in lower back  . Total knee arthroplasty  06/28/2012    Procedure: TOTAL KNEE ARTHROPLASTY;  Surgeon: Sydnee Cabal, MD;  Location: [facility] ORS;  Service: Orthopedics;  Laterality: Left;  . Joint replacement Left 06/28/12    knee replacement    Family History:  Family  History  Problem Relation Age of Onset  . Heart disease Mother   . Lung disease Father   . Kidney disease      Social History:  reports that she quit smoking about 29 years ago. Her smoking use included Cigarettes. She has a 22 pack-year smoking history. She has never used smokeless tobacco. She reports that she does not drink alcohol or use illicit drugs.  Allergies: No Known Allergies  Medications:   Medication List    ASK your doctor about these medications       albuterol (2.5 MG/3ML) 0.083% nebulizer solution  Commonly known as:  PROVENTIL  Take 3 mLs (2.5 mg total)  by nebulization 4 (four) times daily as needed for wheezing or shortness of breath.     ALPRAZolam 0.5 MG tablet  Commonly known as:  XANAX  Take 0.25-0.5 mg by mouth 4 (four) times daily as needed for anxiety.     aspirin EC 81 MG tablet  Take 81 mg by mouth every evening.     calcium-vitamin D 500-200 MG-UNIT per tablet  Take 1 tablet by mouth 2 (two) times daily with a meal.     docusate sodium 100 MG capsule  Commonly known as:  COLACE  Take 200 mg by mouth at bedtime.     ferrous sulfate 325 (65 FE) MG tablet  Take 325 mg by mouth every evening.     Fluticasone-Salmeterol 250-50 MCG/DOSE Aepb  Commonly known as:  ADVAIR DISKUS  Inhale 1 puff into the lungs 2 (two) times daily.     furosemide 40 MG tablet  Commonly known as:  LASIX  Take 40 mg by mouth 2 (two) times daily.     gabapentin 600 MG tablet  Commonly known as:  NEURONTIN  Take 600 mg by mouth 4 (four) times daily.     GAS-X PO  Take 1 tablet by mouth daily as needed (for gas).     guaiFENesin 600 MG 12 hr tablet  Commonly known as:  MUCINEX  Take 1,200 mg by mouth 2 (two) times daily.     isosorbide mononitrate 30 MG 24 hr tablet  Commonly known as:  IMDUR  Take 1 tablet (30 mg total) by mouth at bedtime.     metoprolol 50 MG tablet  Commonly known as:  LOPRESSOR  Take 1 tablet (50 mg total) by mouth 2 (two) times daily.     multivitamin with minerals Tabs tablet  Take 1 tablet by mouth daily.     nitroGLYCERIN 0.4 MG SL tablet  Commonly known as:  NITROSTAT  Place 0.4 mg under the tongue every 5 (five) minutes as needed for chest pain.     nortriptyline 50 MG capsule  Commonly known as:  PAMELOR  Take 1 capsule (50 mg total) by mouth at bedtime.     omeprazole 20 MG capsule  Commonly known as:  PRILOSEC  Take 20 mg by mouth 2 (two) times daily.     ondansetron 4 MG tablet  Commonly known as:  ZOFRAN  Take 4 mg by mouth every 8 (eight) hours as needed for nausea or vomiting.      oxyCODONE-acetaminophen 5-325 MG per tablet  Commonly known as:  PERCOCET/ROXICET  Take 2 tablets by mouth every 4 (four) hours as needed for pain.     potassium chloride SA 20 MEQ tablet  Commonly known as:  K-DUR,KLOR-CON  Take 20 mEq by mouth 2 (two) times daily.     simvastatin 20  MG tablet  Commonly known as:  ZOCOR  Take 20 mg by mouth at bedtime.     warfarin 5 MG tablet  Commonly known as:  COUMADIN  - Take 2.5-5 mg by mouth daily. Take 0.5 tablet Monday, Wednesday and Friday  - Take 1 tablet Tuesday, Thursday, Saturday and Sunday       Please HPI for pertinent positives, otherwise complete 10 system ROS negative.  Physical Exam: BP 130/74  Pulse 109  Temp(Src) 99.2 F (37.3 C) (Oral)  Resp 21  SpO2 100% There is no weight on file to calculate BMI.  General Appearance:  Alert, cooperative, no distress  Head:  Normocephalic, without obvious abnormality, atraumatic  Neck: Supple, symmetrical, trachea midline  Lungs:   Clear to auscultation bilaterally, no w/r/r, respirations unlabored without use of accessory muscles.  Chest Wall:  No tenderness or deformity  Heart:  Regular rate and rhythm, S1, S2 normal, no murmur, rub or gallop.  Abdomen:   Soft, non-tender, non distended.  Extremities: Extremities normal, atraumatic, no cyanosis or edema  Neurologic: Normal affect, no gross deficits.   Results for orders placed during the hospital encounter of 10/11/13 (from the past 48 hour(s))  APTT     Status: Abnormal   Collection Time    10/11/13  7:10 AM      Result Value Ref Range   aPTT 50 (*) 24 - 37 seconds   Comment:            IF BASELINE aPTT IS ELEVATED,     SUGGEST PATIENT RISK ASSESSMENT     BE USED TO DETERMINE APPROPRIATE     ANTICOAGULANT THERAPY.  CBC     Status: Abnormal   Collection Time    10/11/13  7:10 AM      Result Value Ref Range   WBC 2.4 (*) 4.0 - 10.5 K/uL   RBC 2.79 (*) 3.87 - 5.11 MIL/uL   Hemoglobin 9.3 (*) 12.0 - 15.0 g/dL   HCT  29.3 (*) 36.0 - 46.0 %   MCV 105.0 (*) 78.0 - 100.0 fL   MCH 33.3  26.0 - 34.0 pg   MCHC 31.7  30.0 - 36.0 g/dL   RDW 21.4 (*) 11.5 - 15.5 %   Platelets 99 (*) 150 - 400 K/uL   Comment: SPECIMEN CHECKED FOR CLOTS     REPEATED TO VERIFY     PLATELET COUNT CONFIRMED BY SMEAR  PROTIME-INR     Status: Abnormal   Collection Time    10/11/13  7:10 AM      Result Value Ref Range   Prothrombin Time 17.0 (*) 11.6 - 15.2 seconds   INR 1.42  0.00 - 1.49   No results found.  Assessment/Plan Pancytopenia, request for image guided bone marrow biopsy. Patient has been NPO, labs reviewed. Atrial fibrillation on chronic coumadin.  Risks and Benefits discussed with the patient. All of the patient's questions were answered, patient is agreeable to proceed. Consent signed and in chart.   Tsosie Billing D PA-C 10/11/2013, 9:09 AM

## 2013-10-11 NOTE — Procedures (Signed)
BM aspirate and core R iliac bone No comp

## 2013-10-20 LAB — CHROMOSOME ANALYSIS, BONE MARROW

## 2013-10-22 ENCOUNTER — Inpatient Hospital Stay (HOSPITAL_COMMUNITY)
Admission: EM | Admit: 2013-10-22 | Discharge: 2013-10-26 | DRG: 871 | Disposition: A | Discharge status: Home / Self Care | Payer: Medicare Other | Attending: Internal Medicine | Admitting: Internal Medicine

## 2013-10-22 ENCOUNTER — Encounter (HOSPITAL_COMMUNITY): Payer: Self-pay | Admitting: Emergency Medicine

## 2013-10-22 ENCOUNTER — Emergency Department (HOSPITAL_COMMUNITY): Payer: Medicare Other

## 2013-10-22 DIAGNOSIS — IMO0001 Reserved for inherently not codable concepts without codable children: Secondary | ICD-10-CM | POA: Diagnosis present

## 2013-10-22 DIAGNOSIS — I2510 Atherosclerotic heart disease of native coronary artery without angina pectoris: Secondary | ICD-10-CM | POA: Diagnosis present

## 2013-10-22 DIAGNOSIS — F411 Generalized anxiety disorder: Secondary | ICD-10-CM | POA: Diagnosis present

## 2013-10-22 DIAGNOSIS — E785 Hyperlipidemia, unspecified: Secondary | ICD-10-CM | POA: Diagnosis present

## 2013-10-22 DIAGNOSIS — R000 Tachycardia, unspecified: Secondary | ICD-10-CM | POA: Diagnosis present

## 2013-10-22 DIAGNOSIS — I252 Old myocardial infarction: Secondary | ICD-10-CM

## 2013-10-22 DIAGNOSIS — Z8249 Family history of ischemic heart disease and other diseases of the circulatory system: Secondary | ICD-10-CM

## 2013-10-22 DIAGNOSIS — D638 Anemia in other chronic diseases classified elsewhere: Secondary | ICD-10-CM | POA: Diagnosis present

## 2013-10-22 DIAGNOSIS — A419 Sepsis, unspecified organism: Secondary | ICD-10-CM

## 2013-10-22 DIAGNOSIS — B0229 Other postherpetic nervous system involvement: Secondary | ICD-10-CM

## 2013-10-22 DIAGNOSIS — Z7982 Long term (current) use of aspirin: Secondary | ICD-10-CM

## 2013-10-22 DIAGNOSIS — E860 Dehydration: Secondary | ICD-10-CM

## 2013-10-22 DIAGNOSIS — N183 Chronic kidney disease, stage 3 unspecified: Secondary | ICD-10-CM | POA: Diagnosis present

## 2013-10-22 DIAGNOSIS — K5900 Constipation, unspecified: Secondary | ICD-10-CM | POA: Diagnosis present

## 2013-10-22 DIAGNOSIS — G894 Chronic pain syndrome: Secondary | ICD-10-CM | POA: Diagnosis present

## 2013-10-22 DIAGNOSIS — Z951 Presence of aortocoronary bypass graft: Secondary | ICD-10-CM

## 2013-10-22 DIAGNOSIS — K219 Gastro-esophageal reflux disease without esophagitis: Secondary | ICD-10-CM | POA: Diagnosis present

## 2013-10-22 DIAGNOSIS — Z79899 Other long term (current) drug therapy: Secondary | ICD-10-CM

## 2013-10-22 DIAGNOSIS — I459 Conduction disorder, unspecified: Secondary | ICD-10-CM | POA: Diagnosis present

## 2013-10-22 DIAGNOSIS — I872 Venous insufficiency (chronic) (peripheral): Secondary | ICD-10-CM | POA: Diagnosis present

## 2013-10-22 DIAGNOSIS — Z96659 Presence of unspecified artificial knee joint: Secondary | ICD-10-CM

## 2013-10-22 DIAGNOSIS — J441 Chronic obstructive pulmonary disease with (acute) exacerbation: Secondary | ICD-10-CM | POA: Diagnosis present

## 2013-10-22 DIAGNOSIS — I4891 Unspecified atrial fibrillation: Secondary | ICD-10-CM | POA: Diagnosis present

## 2013-10-22 DIAGNOSIS — M545 Low back pain, unspecified: Secondary | ICD-10-CM

## 2013-10-22 DIAGNOSIS — D509 Iron deficiency anemia, unspecified: Secondary | ICD-10-CM | POA: Diagnosis present

## 2013-10-22 DIAGNOSIS — J9601 Acute respiratory failure with hypoxia: Secondary | ICD-10-CM

## 2013-10-22 DIAGNOSIS — Z7901 Long term (current) use of anticoagulants: Secondary | ICD-10-CM

## 2013-10-22 DIAGNOSIS — D61818 Other pancytopenia: Secondary | ICD-10-CM | POA: Diagnosis present

## 2013-10-22 DIAGNOSIS — T148XXA Other injury of unspecified body region, initial encounter: Secondary | ICD-10-CM

## 2013-10-22 DIAGNOSIS — I679 Cerebrovascular disease, unspecified: Secondary | ICD-10-CM

## 2013-10-22 DIAGNOSIS — I129 Hypertensive chronic kidney disease with stage 1 through stage 4 chronic kidney disease, or unspecified chronic kidney disease: Secondary | ICD-10-CM | POA: Diagnosis present

## 2013-10-22 DIAGNOSIS — E876 Hypokalemia: Secondary | ICD-10-CM

## 2013-10-22 DIAGNOSIS — E669 Obesity, unspecified: Secondary | ICD-10-CM

## 2013-10-22 DIAGNOSIS — J9690 Respiratory failure, unspecified, unspecified whether with hypoxia or hypercapnia: Secondary | ICD-10-CM

## 2013-10-22 DIAGNOSIS — I509 Heart failure, unspecified: Secondary | ICD-10-CM

## 2013-10-22 DIAGNOSIS — J9600 Acute respiratory failure, unspecified whether with hypoxia or hypercapnia: Secondary | ICD-10-CM | POA: Diagnosis present

## 2013-10-22 DIAGNOSIS — K589 Irritable bowel syndrome without diarrhea: Secondary | ICD-10-CM | POA: Diagnosis present

## 2013-10-22 DIAGNOSIS — D539 Nutritional anemia, unspecified: Secondary | ICD-10-CM

## 2013-10-22 DIAGNOSIS — Z87891 Personal history of nicotine dependence: Secondary | ICD-10-CM

## 2013-10-22 DIAGNOSIS — M1990 Unspecified osteoarthritis, unspecified site: Secondary | ICD-10-CM | POA: Diagnosis present

## 2013-10-22 DIAGNOSIS — J189 Pneumonia, unspecified organism: Secondary | ICD-10-CM | POA: Diagnosis present

## 2013-10-22 DIAGNOSIS — M889 Osteitis deformans of unspecified bone: Secondary | ICD-10-CM

## 2013-10-22 DIAGNOSIS — J449 Chronic obstructive pulmonary disease, unspecified: Secondary | ICD-10-CM | POA: Diagnosis present

## 2013-10-22 DIAGNOSIS — Z6832 Body mass index (BMI) 32.0-32.9, adult: Secondary | ICD-10-CM

## 2013-10-22 DIAGNOSIS — R6510 Systemic inflammatory response syndrome (SIRS) of non-infectious origin without acute organ dysfunction: Secondary | ICD-10-CM | POA: Diagnosis present

## 2013-10-22 LAB — BLOOD GAS, ARTERIAL
Acid-Base Excess: 1 mmol/L (ref 0.0–2.0)
Bicarbonate: 24.3 mEq/L — ABNORMAL HIGH (ref 20.0–24.0)
DRAWN BY: 307971
O2 CONTENT: 2 L/min
O2 SAT: 93.6 %
PATIENT TEMPERATURE: 98.6
PO2 ART: 65.7 mmHg — AB (ref 80.0–100.0)
TCO2: 23 mmol/L (ref 0–100)
pCO2 arterial: 35.1 mmHg (ref 35.0–45.0)
pH, Arterial: 7.455 — ABNORMAL HIGH (ref 7.350–7.450)

## 2013-10-22 LAB — URINALYSIS, ROUTINE W REFLEX MICROSCOPIC
Bilirubin Urine: NEGATIVE
Glucose, UA: NEGATIVE mg/dL
Hgb urine dipstick: NEGATIVE
KETONES UR: NEGATIVE mg/dL
LEUKOCYTES UA: NEGATIVE
NITRITE: NEGATIVE
PROTEIN: NEGATIVE mg/dL
Specific Gravity, Urine: 1.009 (ref 1.005–1.030)
UROBILINOGEN UA: 1 mg/dL (ref 0.0–1.0)
pH: 6 (ref 5.0–8.0)

## 2013-10-22 LAB — PROTIME-INR
INR: 1.45 (ref 0.00–1.49)
PROTHROMBIN TIME: 17.3 s — AB (ref 11.6–15.2)

## 2013-10-22 LAB — CBC
HEMATOCRIT: 25.8 % — AB (ref 36.0–46.0)
HEMOGLOBIN: 8.1 g/dL — AB (ref 12.0–15.0)
MCH: 32.5 pg (ref 26.0–34.0)
MCHC: 31.4 g/dL (ref 30.0–36.0)
MCV: 103.6 fL — ABNORMAL HIGH (ref 78.0–100.0)
Platelets: 153 10*3/uL (ref 150–400)
RBC: 2.49 MIL/uL — ABNORMAL LOW (ref 3.87–5.11)
RDW: 21 % — ABNORMAL HIGH (ref 11.5–15.5)
WBC: 3.6 10*3/uL — ABNORMAL LOW (ref 4.0–10.5)

## 2013-10-22 LAB — COMPREHENSIVE METABOLIC PANEL
ALBUMIN: 3.3 g/dL — AB (ref 3.5–5.2)
ALT: 10 U/L (ref 0–35)
AST: 23 U/L (ref 0–37)
Alkaline Phosphatase: 93 U/L (ref 39–117)
BUN: 25 mg/dL — ABNORMAL HIGH (ref 6–23)
CO2: 25 mEq/L (ref 19–32)
CREATININE: 1.52 mg/dL — AB (ref 0.50–1.10)
Calcium: 8.8 mg/dL (ref 8.4–10.5)
Chloride: 94 mEq/L — ABNORMAL LOW (ref 96–112)
GFR calc Af Amer: 38 mL/min — ABNORMAL LOW (ref >90)
GFR calc non Af Amer: 33 mL/min — ABNORMAL LOW (ref >90)
Glucose, Bld: 104 mg/dL — ABNORMAL HIGH (ref 70–99)
Potassium: 4.3 mEq/L (ref 3.7–5.3)
Sodium: 132 mEq/L — ABNORMAL LOW (ref 137–147)
TOTAL PROTEIN: 7.8 g/dL (ref 6.0–8.3)
Total Bilirubin: 0.9 mg/dL (ref 0.3–1.2)

## 2013-10-22 LAB — PRO B NATRIURETIC PEPTIDE: Pro B Natriuretic peptide (BNP): 5985 pg/mL — ABNORMAL HIGH (ref 0–125)

## 2013-10-22 LAB — I-STAT CG4 LACTIC ACID, ED: Lactic Acid, Venous: 1.21 mmol/L (ref 0.5–2.2)

## 2013-10-22 LAB — MAGNESIUM: MAGNESIUM: 2 mg/dL (ref 1.5–2.5)

## 2013-10-22 LAB — PHOSPHORUS: PHOSPHORUS: 2.7 mg/dL (ref 2.3–4.6)

## 2013-10-22 LAB — I-STAT TROPONIN, ED: Troponin i, poc: 0 ng/mL (ref 0.00–0.08)

## 2013-10-22 LAB — MRSA PCR SCREENING: MRSA by PCR: POSITIVE — AB

## 2013-10-22 LAB — HEPARIN LEVEL (UNFRACTIONATED): HEPARIN UNFRACTIONATED: 0.35 [IU]/mL (ref 0.30–0.70)

## 2013-10-22 MED ORDER — ONDANSETRON HCL 4 MG PO TABS
4.0000 mg | ORAL_TABLET | Freq: Four times a day (QID) | ORAL | Status: DC | PRN
  Administered 2013-10-24: 4 mg via ORAL
  Filled 2013-10-22: qty 1

## 2013-10-22 MED ORDER — PIPERACILLIN-TAZOBACTAM 3.375 G IVPB
3.3750 g | Freq: Three times a day (TID) | INTRAVENOUS | Status: DC
  Filled 2013-10-22 (×2): qty 50

## 2013-10-22 MED ORDER — PANTOPRAZOLE SODIUM 40 MG PO TBEC
40.0000 mg | DELAYED_RELEASE_TABLET | Freq: Every day | ORAL | Status: DC
  Administered 2013-10-22 – 2013-10-25 (×4): 40 mg via ORAL
  Filled 2013-10-22 (×5): qty 1

## 2013-10-22 MED ORDER — ACETAMINOPHEN 325 MG PO TABS
650.0000 mg | ORAL_TABLET | Freq: Four times a day (QID) | ORAL | Status: DC | PRN
  Administered 2013-10-22: 650 mg via ORAL
  Filled 2013-10-22: qty 2

## 2013-10-22 MED ORDER — VANCOMYCIN HCL IN DEXTROSE 1-5 GM/200ML-% IV SOLN
1000.0000 mg | Freq: Once | INTRAVENOUS | Status: AC
  Administered 2013-10-22: 1000 mg via INTRAVENOUS
  Filled 2013-10-22: qty 200

## 2013-10-22 MED ORDER — VANCOMYCIN HCL IN DEXTROSE 750-5 MG/150ML-% IV SOLN
750.0000 mg | Freq: Two times a day (BID) | INTRAVENOUS | Status: DC
  Filled 2013-10-22: qty 150

## 2013-10-22 MED ORDER — LEVALBUTEROL HCL 1.25 MG/0.5ML IN NEBU
1.2500 mg | INHALATION_SOLUTION | Freq: Four times a day (QID) | RESPIRATORY_TRACT | Status: DC | PRN
  Filled 2013-10-22: qty 0.5

## 2013-10-22 MED ORDER — PIPERACILLIN-TAZOBACTAM 3.375 G IVPB 30 MIN
3.3750 g | INTRAVENOUS | Status: AC
  Administered 2013-10-22: 3.375 g via INTRAVENOUS
  Filled 2013-10-22: qty 50

## 2013-10-22 MED ORDER — MUPIROCIN 2 % EX OINT
1.0000 "application " | TOPICAL_OINTMENT | Freq: Two times a day (BID) | CUTANEOUS | Status: DC
  Administered 2013-10-22 – 2013-10-26 (×8): 1 via NASAL
  Filled 2013-10-22: qty 22

## 2013-10-22 MED ORDER — ALBUTEROL SULFATE (2.5 MG/3ML) 0.083% IN NEBU
5.0000 mg | INHALATION_SOLUTION | Freq: Once | RESPIRATORY_TRACT | Status: AC
Start: 2013-10-22 — End: 2013-10-22
  Administered 2013-10-22: 5 mg via RESPIRATORY_TRACT
  Filled 2013-10-22: qty 6

## 2013-10-22 MED ORDER — FUROSEMIDE 10 MG/ML IJ SOLN
80.0000 mg | Freq: Once | INTRAMUSCULAR | Status: AC
  Administered 2013-10-22: 80 mg via INTRAVENOUS
  Filled 2013-10-22: qty 8

## 2013-10-22 MED ORDER — ACETAMINOPHEN 325 MG PO TABS
650.0000 mg | ORAL_TABLET | Freq: Four times a day (QID) | ORAL | Status: DC | PRN
  Administered 2013-10-24: 650 mg via ORAL
  Filled 2013-10-22: qty 2

## 2013-10-22 MED ORDER — ASPIRIN EC 81 MG PO TBEC
81.0000 mg | DELAYED_RELEASE_TABLET | Freq: Every day | ORAL | Status: DC
  Administered 2013-10-22 – 2013-10-25 (×4): 81 mg via ORAL
  Filled 2013-10-22 (×5): qty 1

## 2013-10-22 MED ORDER — ALBUTEROL SULFATE (2.5 MG/3ML) 0.083% IN NEBU: 2.5000 mg | INHALATION_SOLUTION | Freq: Four times a day (QID) | RESPIRATORY_TRACT | Status: DC

## 2013-10-22 MED ORDER — OXYCODONE HCL 5 MG PO TABS
5.0000 mg | ORAL_TABLET | ORAL | Status: DC | PRN
  Administered 2013-10-22 – 2013-10-26 (×9): 5 mg via ORAL
  Filled 2013-10-22 (×9): qty 1

## 2013-10-22 MED ORDER — METHYLPREDNISOLONE SODIUM SUCC 125 MG IJ SOLR
60.0000 mg | Freq: Every day | INTRAMUSCULAR | Status: DC
  Administered 2013-10-23 – 2013-10-24 (×2): 60 mg via INTRAVENOUS
  Filled 2013-10-22 (×3): qty 0.96

## 2013-10-22 MED ORDER — DOCUSATE SODIUM 100 MG PO CAPS
200.0000 mg | ORAL_CAPSULE | Freq: Every day | ORAL | Status: DC
  Administered 2013-10-22 – 2013-10-25 (×4): 200 mg via ORAL
  Filled 2013-10-22 (×5): qty 2

## 2013-10-22 MED ORDER — WARFARIN - PHARMACIST DOSING INPATIENT: Freq: Every day | Status: DC

## 2013-10-22 MED ORDER — METHYLPREDNISOLONE SODIUM SUCC 125 MG IJ SOLR
125.0000 mg | Freq: Once | INTRAMUSCULAR | Status: AC
  Administered 2013-10-22: 125 mg via INTRAVENOUS
  Filled 2013-10-22: qty 2

## 2013-10-22 MED ORDER — SODIUM CHLORIDE 0.9 % IV BOLUS (SEPSIS)
1000.0000 mL | Freq: Once | INTRAVENOUS | Status: AC
  Administered 2013-10-22: 1000 mL via INTRAVENOUS

## 2013-10-22 MED ORDER — ALBUTEROL SULFATE (2.5 MG/3ML) 0.083% IN NEBU
5.0000 mg | INHALATION_SOLUTION | Freq: Once | RESPIRATORY_TRACT | Status: AC
  Administered 2013-10-22: 5 mg via RESPIRATORY_TRACT
  Filled 2013-10-22: qty 6

## 2013-10-22 MED ORDER — HEPARIN (PORCINE) IN NACL 100-0.45 UNIT/ML-% IJ SOLN
1300.0000 [IU]/h | INTRAMUSCULAR | Status: DC
  Administered 2013-10-22 – 2013-10-23 (×2): 1300 [IU]/h via INTRAVENOUS
  Filled 2013-10-22 (×2): qty 250

## 2013-10-22 MED ORDER — CHLORHEXIDINE GLUCONATE CLOTH 2 % EX PADS
6.0000 | MEDICATED_PAD | Freq: Every day | CUTANEOUS | Status: DC
  Administered 2013-10-23 – 2013-10-25 (×3): 6 via TOPICAL

## 2013-10-22 MED ORDER — MOMETASONE FURO-FORMOTEROL FUM 100-5 MCG/ACT IN AERO
2.0000 | INHALATION_SPRAY | Freq: Two times a day (BID) | RESPIRATORY_TRACT | Status: DC
  Administered 2013-10-23 – 2013-10-26 (×7): 2 via RESPIRATORY_TRACT
  Filled 2013-10-22: qty 8.8

## 2013-10-22 MED ORDER — ACETAMINOPHEN 650 MG RE SUPP: 650.0000 mg | Freq: Four times a day (QID) | RECTAL | Status: DC | PRN

## 2013-10-22 MED ORDER — NORTRIPTYLINE HCL 25 MG PO CAPS
50.0000 mg | ORAL_CAPSULE | Freq: Every day | ORAL | Status: DC
  Administered 2013-10-22 – 2013-10-25 (×4): 50 mg via ORAL
  Filled 2013-10-22 (×5): qty 2

## 2013-10-22 MED ORDER — ONDANSETRON HCL 4 MG/2ML IJ SOLN
4.0000 mg | Freq: Four times a day (QID) | INTRAMUSCULAR | Status: DC | PRN
  Administered 2013-10-24: 4 mg via INTRAVENOUS
  Filled 2013-10-22: qty 2

## 2013-10-22 MED ORDER — WARFARIN SODIUM 7.5 MG PO TABS
7.5000 mg | ORAL_TABLET | Freq: Once | ORAL | Status: AC
  Administered 2013-10-22: 7.5 mg via ORAL
  Filled 2013-10-22: qty 1

## 2013-10-22 MED ORDER — IPRATROPIUM BROMIDE 0.02 % IN SOLN
0.5000 mg | Freq: Once | RESPIRATORY_TRACT | Status: AC
  Administered 2013-10-22: 0.5 mg via RESPIRATORY_TRACT
  Filled 2013-10-22: qty 2.5

## 2013-10-22 MED ORDER — ALBUTEROL SULFATE (2.5 MG/3ML) 0.083% IN NEBU: 2.5000 mg | INHALATION_SOLUTION | RESPIRATORY_TRACT | Status: DC | PRN

## 2013-10-22 MED ORDER — SIMVASTATIN 20 MG PO TABS
20.0000 mg | ORAL_TABLET | Freq: Every day | ORAL | Status: DC
  Administered 2013-10-22 – 2013-10-25 (×4): 20 mg via ORAL
  Filled 2013-10-22 (×5): qty 1

## 2013-10-22 MED ORDER — VANCOMYCIN HCL IN DEXTROSE 750-5 MG/150ML-% IV SOLN
750.0000 mg | Freq: Two times a day (BID) | INTRAVENOUS | Status: DC
  Administered 2013-10-22 – 2013-10-24 (×5): 750 mg via INTRAVENOUS
  Filled 2013-10-22 (×8): qty 150

## 2013-10-22 MED ORDER — PIPERACILLIN-TAZOBACTAM 3.375 G IVPB
3.3750 g | Freq: Three times a day (TID) | INTRAVENOUS | Status: DC
  Administered 2013-10-22 – 2013-10-25 (×9): 3.375 g via INTRAVENOUS
  Filled 2013-10-22 (×13): qty 50

## 2013-10-22 MED ORDER — NITROGLYCERIN 0.4 MG SL SUBL: 0.4000 mg | SUBLINGUAL_TABLET | SUBLINGUAL | Status: DC | PRN

## 2013-10-22 MED ORDER — ALPRAZOLAM 0.25 MG PO TABS
0.2500 mg | ORAL_TABLET | Freq: Three times a day (TID) | ORAL | Status: DC | PRN
  Administered 2013-10-22 – 2013-10-26 (×8): 0.25 mg via ORAL
  Filled 2013-10-22 (×8): qty 1

## 2013-10-22 MED ORDER — METOPROLOL TARTRATE 12.5 MG HALF TABLET
12.5000 mg | ORAL_TABLET | Freq: Two times a day (BID) | ORAL | Status: DC
  Administered 2013-10-22 – 2013-10-24 (×6): 12.5 mg via ORAL
  Filled 2013-10-22 (×9): qty 1

## 2013-10-22 NOTE — Progress Notes (Signed)
ANTICOAGULATION CONSULT NOTE - Initial Consult  Pharmacy Consult for Heparin, warfarin Indication: atrial fibrillation, subtherapeutic INR  No Known Allergies  Patient Measurements: Height: 5' 7.5" (171.5 cm) Weight: 206 lb (93.441 kg) IBW/kg (Calculated) : 62.75 Heparin Dosing Weight: 83 kg  Vital Signs: Temp: 102.3 F (39.1 C) (04/05 0820) Temp src: Oral (04/05 0820) BP: 115/79 mmHg (04/05 0915) Pulse Rate: 122 (04/05 1010)  Labs:  Recent Labs  10/22/13 0844  HGB 8.1*  HCT 25.8*  PLT 153  LABPROT 17.3*  INR 1.45  CREATININE 1.52*    Estimated Creatinine Clearance: 39.6 ml/min (by C-G formula based on Cr of 1.52).   Medical History: Past Medical History  Diagnosis Date  . Coronary atherosclerosis of unspecified type of vessel, native or graft   . Atrial fibrillation 01/19/2012    stress test - non-gated secondary to A fib,  normal myocaridal perfusion study  . Unspecified venous (peripheral) insufficiency   . Other and unspecified hyperlipidemia   . Morbid obesity   . Esophageal reflux   . Irritable bowel syndrome   . Other symptoms involving urinary system(788.99)   . Chronic pain syndrome   . 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 Anxiety state, unspecified   . Herpes zoster without mention of complication   . Herpes zoster with other nervous system complications(053.19)   . Lumbago   . Anticoagulated on Coumadin, chronic for A. Fib 04/08/2012  . COPD (chronic obstructive pulmonary disease)   . 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 Pneumonia 4 years ago  . Bronchitis, chronic   . Fibromyalgia   . Arthritis   . CAD (coronary artery disease) 04/29/2006    echo -EF 45-50%, impaired LV relaxation  . 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 GI bleed 04/07/2012    endoscopy/colonoscopy unremarkable  . 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 DJD (degenerative joint disease)   .  Myocardial infarction   . Anemia     Medications:  Scheduled:  . albuterol  2.5 mg Nebulization Q6H  . aspirin EC  81 mg Oral QPC supper  . docusate sodium  200 mg Oral QHS  . [START ON 10/23/2013] methylPREDNISolone (SOLU-MEDROL) injection  60 mg Intravenous Daily  . metoprolol  12.5 mg Oral BID  . mometasone-formoterol  2 puff Inhalation BID  . nortriptyline  50 mg Oral QHS  . pantoprazole  40 mg Oral Daily  . simvastatin  20 mg Oral QHS  . vancomycin  1,000 mg Intravenous Once   Infusions:    Assessment: 71 yoF [facility] ED [facility]/5 with altered mental status and fever.  PMH includes Afib on chronic anticoagulation with warfarin.  PTA warfarin dosing is $RemoveB'5mg'rEgSFmol$  daily except 2.$RemoveBefore'5mg'dXmjwYuAtCieg$  on Mon, Wed, Friday.  Her last dose was 10/21/13 and her INR on admission is subtherapeutic.  Pharmacy is consulted to dose IV heparin and warfarin.  INR 1.45  CBC: Hgb 8.1, Plt 153 (History of pancytopenia, s/p bone marrow biopsy on 10/11/13.)  SCr 1.52, CrCl ~ 40 ml/min   Goal of Therapy:  INR 2-3 Heparin level 0.3-0.7 units/ml Monitor platelets by anticoagulation protocol: Yes   Plan:   Warfarin 7.$RemoveBef'5mg'zqPUSzSWXO$  PO tonight x1 dose  No Heparin bolus; Start heparin IV infusion at 1300 units/hr (13 ml/hr)  Heparin level 8 hours after starting  Daily INR, heparin level, and CBC  Continue to monitor H&H and platelets   Altha Harm  Aeris Hersman PharmD, BCPS Pager [phone] 10/22/2013 11:30 AM

## 2013-10-22 NOTE — H&P (Signed)
[name] History and Physical  [name] OIN:[id number] DOB: Jun 20, 1941 DOA: 10/22/2013  Referring physician: Dr. Canary Bowers PCP: Dana Space, MD   Chief Complaint: Altered Mental Status  HPI: [name] is a 73 y.o. female  With a past medical history of hypertension, coronary artery disease, s/p CABG, atrial fibrillation on coumadin, COPD, chronic iron deficiency anemia, GERD, CKD stage III, and recent bone marrow biopsy for thrombcytopenia who presented to the ED for altered mental status. The patient's husband reports that she has been confused recently, most notably starting last night and continuing this morning. She has been confused about dates and events. The patient reports "not feeling well" since last night with some shortness of breath. She has a cough and has had chills at home. She is not on home oxygen, but has been using her nebulizer more at home. She denies any chest pain, palpitations, abdominal pain, or active signs of bleeding. Activity makes her shortness of breath worse.  She was admitted for a similar presentation in February which resulted in a bone marrow biopsy performed on 10/11/2013 for pancytopenia. She is scheduled for a follow up with Dr. Earlie Bowers for this on 10/24/2013. Her husband also reports a small sore under her dentures that started 1 week ago, but has been resolving.  Upon evaluation in the ED she was found to be hypotensive, febrile, tachycardic and hypoxic on room air. Her WBC was 3.6 and CXR showed cardiomegaly with vascular congestion, patchy left perihilar and retrocardiac left lower lobe airspace concerning for pneumonia. Pt has bilateral wheezes L>R. Started on Zosyn and Vancomycin. Given 1L NS and $Remo'80mg'QLipg$  lasix in ED. Pt admitted to tele for further workup and treatment of sepsis.   Review of Systems:  Constitutional:  No weight loss, night sweats, Fevers, chills, fatigue.  HEENT: + headaches, + small sore to right lower jaw under  dentures x 1 week No Difficulty swallowing,Sore throat,  No sneezing, itching, ear ache, nasal congestion, post nasal drip,  Cardio-vascular:  No chest pain, Orthopnea, PND, swelling in lower extremities, anasarca, dizziness, palpitations  GI:  No heartburn, indigestion, abdominal pain, nausea, vomiting, diarrhea, change in bowel habits, loss of appetite  Resp: + cough, occasionally productive, + wheezing and shortness of breath x2 days No shortness of breath with exertion or at rest. No excess mucus. No coughing up of blood.No change in color of mucus. No chest wall deformity  Skin:  no rash or lesions.  GU:  no dysuria, change in color of urine, no urgency or frequency. No flank pain.  Musculoskeletal:  No joint pain or swelling. No decreased range of motion. No back pain.  Psych: + confusion about dates and events  No change in mood or affect. No depression or anxiety.    Past Medical History  Diagnosis Date  . Coronary atherosclerosis of unspecified type of vessel, native or graft   . Atrial fibrillation 01/19/2012    stress test - non-gated secondary to A fib,  normal myocaridal perfusion study  . Unspecified venous (peripheral) insufficiency   . Other and unspecified hyperlipidemia   . Morbid obesity   . Esophageal reflux   . Irritable bowel syndrome   . Other symptoms involving urinary system(788.99)   . Chronic pain syndrome   . 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 Anxiety state, unspecified   . Herpes zoster without mention of complication   . Herpes zoster with other nervous system complications(053.19)   . Lumbago   .  Anticoagulated on Coumadin, chronic for A. Fib 04/08/2012  . COPD (chronic obstructive pulmonary disease)   . 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 Pneumonia 4 years ago  . Bronchitis, chronic   . Fibromyalgia   . Arthritis   . CAD (coronary artery disease) 04/29/2006    echo -EF 45-50%, impaired LV relaxation    . 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 GI bleed 04/07/2012    endoscopy/colonoscopy unremarkable  . 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 DJD (degenerative joint disease)   . Myocardial infarction   . Anemia    Past Surgical History  Procedure Laterality Date  . Vesicovaginal fistula closure w/ tah    . Cardiac catheterization  01/06/2010    RCA mid artery stenosis at 99%, LAD proximal occlusion 90%  at origin of ramus  . Nissen fundoplication    . Esophagogastroduodenoscopy  04/09/2012    Procedure: ESOPHAGOGASTRODUODENOSCOPY (EGD);  Surgeon: Theda Belfast, MD;  Location: [facility] ENDOSCOPY;  Service: Endoscopy;  Laterality: N/A;  tentatively scheduled per Blase Mess due to patients breathing problems  . Colonoscopy  04/11/2012    Procedure: COLONOSCOPY;  Surgeon: Meryl Dare, MD,FACG;  Location: [facility] ENDOSCOPY;  Service: Endoscopy;  Laterality: N/A;  . Abdominal hysterectomy  at 73 years old  . Appendectomy  about 50 years ago  . Coronary artery bypass graft  1997    LIMA to LAD, SVG to ramus, SVG to RCA  . Cholecystectomy  50 years ago  . Neck surgery  73 years old    full movement  . Right knee arthroscopy  3 years ago  . Eye surgery  2005    cataracts both eyes  . Back surgery      x5, "birdcage" and fused in lower back  . Total knee arthroplasty  06/28/2012    Procedure: TOTAL KNEE ARTHROPLASTY;  Surgeon: Eugenia Mcalpine, MD;  Location: [facility] ORS;  Service: Orthopedics;  Laterality: Left;  . Joint replacement Left 06/28/12    knee replacement   Social History:  reports that she quit smoking about 29 years ago. Her smoking use included Cigarettes. She has a 22 pack-year smoking history. She has never used smokeless tobacco. She reports that she does not drink alcohol or use illicit drugs.  No Known Allergies  Family History  Problem Relation Age of Onset  . Heart disease Mother   . Lung disease Father   . Kidney disease        Prior to Admission medications   Medication Sig Start Date End Date Taking? Authorizing Provider  albuterol (PROVENTIL) (2.5 MG/3ML) 0.083% nebulizer solution Take 3 mLs (2.5 mg total) by nebulization 4 (four) times daily as needed for wheezing or shortness of breath. 09/21/13  Yes Michele Mcalpine, MD  ALPRAZolam Prudy Feeler) 0.5 MG tablet Take 0.25-0.5 mg by mouth 4 (four) times daily as needed for anxiety.   Yes Historical Provider, MD  aspirin EC 81 MG tablet Take 81 mg by mouth daily after supper.    Yes Historical Provider, MD  Calcium Carbonate-Vitamin D (CALCIUM-VITAMIN D) 500-200 MG-UNIT per tablet Take 1 tablet by mouth 2 (two) times daily with a meal.   Yes Historical Provider, MD  docusate sodium (COLACE) 100 MG capsule Take 200 mg by mouth at bedtime.   Yes Historical Provider, MD  ferrous sulfate 325 (65 FE) MG tablet Take 325 mg by mouth every evening.   Yes  Historical Provider, MD  Fluticasone-Salmeterol (ADVAIR DISKUS) 250-50 MCG/DOSE AEPB Inhale 1 puff into the lungs 2 (two) times daily. 09/21/13  Yes Dana Space, MD  furosemide (LASIX) 40 MG tablet Take 40 mg by mouth 2 (two) times daily.    Yes Historical Provider, MD  gabapentin (NEURONTIN) 600 MG tablet Take 600 mg by mouth 3 (three) times daily.    Yes Historical Provider, MD  guaiFENesin (MUCINEX) 600 MG 12 hr tablet Take 1,200 mg by mouth 2 (two) times daily.   Yes Historical Provider, MD  isosorbide mononitrate (IMDUR) 30 MG 24 hr tablet Take 1 tablet (30 mg total) by mouth at bedtime. 01/16/13  Yes Lorretta Harp, MD  metoprolol (LOPRESSOR) 50 MG tablet Take 1 tablet (50 mg total) by mouth 2 (two) times daily. 09/07/13  Yes Verlee Monte, MD  Multiple Vitamin (MULTIVITAMIN WITH MINERALS) TABS Take 1 tablet by mouth every morning.    Yes Historical Provider, MD  nitroGLYCERIN (NITROSTAT) 0.4 MG SL tablet Place 0.4 mg under the tongue every 5 (five) minutes as needed for chest pain.   Yes Historical Provider, MD  nortriptyline  (PAMELOR) 50 MG capsule Take 1 capsule (50 mg total) by mouth at bedtime. 09/21/13  Yes Dana Space, MD  omeprazole (PRILOSEC) 20 MG capsule Take 20 mg by mouth 2 (two) times daily.   Yes Historical Provider, MD  ondansetron (ZOFRAN) 4 MG tablet Take 4 mg by mouth every 8 (eight) hours as needed for nausea or vomiting.   Yes Historical Provider, MD  oxyCODONE-acetaminophen (PERCOCET/ROXICET) 5-325 MG per tablet Take 2 tablets by mouth every 4 (four) hours as needed for pain.   Yes Historical Provider, MD  potassium chloride SA (K-DUR,KLOR-CON) 20 MEQ tablet Take 20 mEq by mouth 2 (two) times daily.   Yes Historical Provider, MD  simvastatin (ZOCOR) 20 MG tablet Take 20 mg by mouth at bedtime.   Yes Historical Provider, MD  warfarin (COUMADIN) 5 MG tablet Take 2.5-5 mg by mouth every morning. She takes half a tablet on Monday, Wednesday, Friday and one tablet on Tuesday, Thursday, Saturday and Sunday.   Yes Historical Provider, MD   Physical Exam: Filed Vitals:   10/22/13 1048  BP: 110/56  Pulse: 120  Temp: 99.7 F (37.6 C)  Resp: 22    BP 110/56  Pulse 120  Temp(Src) 99.7 F (37.6 C) (Oral)  Resp 22  Ht 5' 7.5" (1.715 m)  Wt 94.2 kg (207 lb 10.8 oz)  BMI 32.03 kg/m2  SpO2 97%  General:  Appears calm and comfortable Eyes: PERRL, normal lids, irises & conjunctiva ENT: + small pinpoint sore to right lower gumline. grossly normal hearing, lips & tongue Neck: no LAD, masses or thyromegaly Cardiovascular: Irregularly irregular. Tachycardic, no m/r/g. No LE edema. Telemetry: atrial fibrillation with RVR. No ST elevation/depression  Respiratory: CTA bilaterally, no w/r/r. Normal respiratory effort. Abdomen: soft, nt, nd, + bowel sounds Skin: no rash or induration seen on limited exam Musculoskeletal: grossly normal tone BUE/BLE Psychiatric: grossly normal mood and affect, speech fluent and appropriate.  Neurologic: No facial asymmetry, moves extremities equally, answers questions  appropriately          Labs on Admission:  Basic Metabolic Panel:  Recent Labs Lab 10/22/13 0834 10/22/13 0844  NA  --  132*  K  --  4.3  CL  --  94*  CO2  --  25  GLUCOSE  --  104*  BUN  --  25*  CREATININE  --  1.52*  CALCIUM  --  8.8  MG 2.0  --   PHOS 2.7  --    Liver Function Tests:  Recent Labs Lab 10/22/13 0844  AST 23  ALT 10  ALKPHOS 93  BILITOT 0.9  PROT 7.8  ALBUMIN 3.3*   No results found for this basename: LIPASE, AMYLASE,  in the last 168 hours No results found for this basename: AMMONIA,  in the last 168 hours CBC:  Recent Labs Lab 10/22/13 0844  WBC 3.6*  HGB 8.1*  HCT 25.8*  MCV 103.6*  PLT 153   Cardiac Enzymes: No results found for this basename: CKTOTAL, CKMB, CKMBINDEX, TROPONINI,  in the last 168 hours  BNP (last 3 results)  Recent Labs  09/03/13 1500 09/21/13 1125 10/22/13 0844  PROBNP 14053.0* 203.0* 5985.0*   CBG: No results found for this basename: GLUCAP,  in the last 168 hours  Radiological Exams on Admission: Dg Chest Port 1 View  (if Code Sepsis Called)  10/22/2013   CLINICAL DATA:  Shortness of breath, fever, altered mental status  EXAM: PORTABLE CHEST - 1 VIEW  COMPARISON:  09/06/2013, 08/31/2013  FINDINGS: Mild background emphysema noted. Prior coronary bypass changes. Heart is enlarged with central vascular congestion. No effusion or pneumothorax. Increased patchy airspace opacity in the left hilar region and also the left lower lobe retrocardiac area. This could represent pneumonia. Trachea is midline. Atherosclerosis of the aorta. Artifact overlies the left apex.  IMPRESSION: Cardiomegaly with vascular congestion  Patchy left perihilar and retrocardiac left lower lobe airspace process concerning for pneumonia.   Electronically Signed   By: Daryll Brod M.D.   On: 10/22/2013 08:47    EKG: Independently reviewed: Atrial fibrillation with RVR. No ST elevation/depression  Assessment/Plan Principal Problems:   Acute  respiratory failure - ? secondary to Community acquired pneumonia vs CHF - CXR shows: cardiomegaly with vascular congestion, patchy left perihilar and retrocardiac left lower lobe airspace concerning for PNA - Broad spectrum antibiotics: Vancomycin and Zosyn - SpO2 82 on room air - Oxygen support as needed to keep sats above 92% - xopenex nebs q6 hrs - Solumedrol $RemoveBefo'60mg'ugSSvYarhdg$  IV QD - Dulera  Sepsis - Likely secondary to PNA - broad spectrum antibiotics - Monitor labs - febrile to 102.3 on 4/5, tylenol PRN  Active Problems:    COPD (chronic obstructive pulmonary disease) - Xopenex, solumedrol, dulera - Oxygen as needed - Broad spectrum abx's listed above.    Anemia, chronic disease - Continue to monitor - Hbg 8.1, hct 25.8 on 4/5 - hold iron related to sepsis    CKD (chronic kidney disease), stage III - Continue to monitor renal function - Cr 1.52 and BUN 25 on 4/5 - Baseline 1.37 - Hold lasix and potassium     Atrial fibrillation with RVR - Metoprolol 12.5 mg for rate control - Aspirin 81 mg - Pt is on coumadin, but INR was 1.45 - Pharmacy to dose coumadin - Started on heparin drip to bridge, pharmacy to dose    S/P CABG (coronary artery bypass graft) - Triple bypass - ECHO on 10/26/12: EF 93-81%, systolic function normal, unable to assess LV diastolic function r/t a-fib   Anemia/Leukopenia - WBC 3.6 on 4/4 - Continue to monitor - Awaiting results of bone biopys, pt has f/u with oncologist scheduled.    GERD (gastroesophageal reflux disease) - Protonix  Constipation r/t chronic pain syndrome/ anxiety - Colace and Sen[place]ot  - Roxicodone for pain - Hold gabapentin due to AMS -  continued xanax at lower dose given patient's chronic use  Hyperlipidemia - continue simvastatin   Code Status: Full Family Communication: Plan of care discussed with husband at bedside Disposition Plan: Pending improvement, to telemetry  Time spent: >60 min  Larwance Sachs [facility] Pager [phone]

## 2013-10-22 NOTE — ED Provider Notes (Addendum)
CSN: [id number]     Arrival date & time 10/22/13  0350 History   First MD Initiated Contact with Patient 10/22/13 0831     Chief Complaint  Patient presents with  . Altered Mental Status  . Fever     (Consider location/radiation/quality/duration/timing/severity/associated sxs/prior Treatment) The history is provided by the patient, medical records and the spouse.   Level 5 Caveat:  AMS This is a 73 year old female with past medical history significant for hypertension, coronary artery disease, atrial fibrillation on Coumadin, COPD, chronic Fe+ deficiency anemia, presenting to the ED for altered mental status. Per husband patient has been confused over the past 3 days, mostly about time and dates of events.  States she has been sleeping more than normal, appeared sluggish, and required increased doses of her albuterol neb and advair at home.  She has had a non-productive cough, unsure of fever at home.  She is not on home oxygen.  Husband states she had similar presentation in February which required hospital admission.  At FU appt with Dr. Lenna Gilford after discharged she was found to have pancytopenia and referred to Dr. Earlie Server for further evaluation.  She had a bone marrow bx performed on 10/11/13 and is scheduled to FU with him this Tuesday 10/24/13 to discuss results.  Past Medical History  Diagnosis Date  . Coronary atherosclerosis of unspecified type of vessel, native or graft   . Atrial fibrillation 01/19/2012    stress test - non-gated secondary to A fib,  normal myocaridal perfusion study  . Unspecified venous (peripheral) insufficiency   . Other and unspecified hyperlipidemia   . Morbid obesity   . Esophageal reflux   . Irritable bowel syndrome   . Other symptoms involving urinary system(788.99)   . Chronic pain syndrome   . 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 Anxiety state, unspecified   . Herpes zoster without mention of complication   . Herpes zoster with other  nervous system complications(053.19)   . Lumbago   . Anticoagulated on Coumadin, chronic for A. Fib 04/08/2012  . COPD (chronic obstructive pulmonary disease)   . 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 Pneumonia 4 years ago  . Bronchitis, chronic   . Fibromyalgia   . Arthritis   . CAD (coronary artery disease) 04/29/2006    echo -EF 45-50%, impaired LV relaxation  . 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 GI bleed 04/07/2012    endoscopy/colonoscopy unremarkable  . 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 DJD (degenerative joint disease)   . Myocardial infarction   . Anemia    Past Surgical History  Procedure Laterality Date  . Vesicovaginal fistula closure w/ tah    . Cardiac catheterization  01/06/2010    RCA mid artery stenosis at 99%, LAD proximal occlusion 90%  at origin of ramus  . Nissen fundoplication    . Esophagogastroduodenoscopy  04/09/2012    Procedure: ESOPHAGOGASTRODUODENOSCOPY (EGD);  Surgeon: Beryle Beams, MD;  Location: [facility] ENDOSCOPY;  Service: Endoscopy;  Laterality: N/A;  tentatively scheduled per Trula Slade due to patients breathing problems  . Colonoscopy  04/11/2012    Procedure: COLONOSCOPY;  Surgeon: Ladene Artist, MD,FACG;  Location: [facility] ENDOSCOPY;  Service: Endoscopy;  Laterality: N/A;  . Abdominal hysterectomy  at 73 years old  . Appendectomy  about 50 years ago  . Coronary artery bypass graft  1997    LIMA to LAD,  SVG to ramus, SVG to RCA  . Cholecystectomy  50 years ago  . Neck surgery  73 years old    full movement  . Right knee arthroscopy  3 years ago  . Eye surgery  2005    cataracts both eyes  . Back surgery      x5, "birdcage" and fused in lower back  . Total knee arthroplasty  06/28/2012    Procedure: TOTAL KNEE ARTHROPLASTY;  Surgeon: Sydnee Cabal, MD;  Location: [facility] ORS;  Service: Orthopedics;  Laterality: Left;  . Joint replacement Left 06/28/12    knee replacement    Family History  Problem Relation Age of Onset  . Heart disease Mother   . Lung disease Father   . Kidney disease     History  Substance Use Topics  . Smoking status: Former Smoker -- 1.00 packs/day for 22 years    Types: Cigarettes    Quit date: 07/20/1984  . Smokeless tobacco: Never Used  . Alcohol Use: No   OB History   Grav Para Term Preterm Abortions TAB SAB Ect Mult Living                 Review of Systems  Unable to perform ROS: Mental status change      Allergies  Review of patient's allergies indicates no known allergies.  Home Medications   Current Outpatient Rx  Name  Route  Sig  Dispense  Refill  . albuterol (PROVENTIL) (2.5 MG/3ML) 0.083% nebulizer solution   Nebulization   Take 3 mLs (2.5 mg total) by nebulization 4 (four) times daily as needed for wheezing or shortness of breath.   360 mL   4   . ALPRAZolam (XANAX) 0.5 MG tablet   Oral   Take 0.25-0.5 mg by mouth 4 (four) times daily as needed for anxiety.         Marland Kitchen aspirin EC 81 MG tablet   Oral   Take 81 mg by mouth every evening.          . Calcium Carbonate-Vitamin D (CALCIUM-VITAMIN D) 500-200 MG-UNIT per tablet   Oral   Take 1 tablet by mouth 2 (two) times daily with a meal.         . docusate sodium (COLACE) 100 MG capsule   Oral   Take 200 mg by mouth at bedtime.         . ferrous sulfate 325 (65 FE) MG tablet   Oral   Take 325 mg by mouth every evening.         . Fluticasone-Salmeterol (ADVAIR DISKUS) 250-50 MCG/DOSE AEPB   Inhalation   Inhale 1 puff into the lungs 2 (two) times daily.   60 each   5   . furosemide (LASIX) 40 MG tablet   Oral   Take 40 mg by mouth 2 (two) times daily.          Marland Kitchen gabapentin (NEURONTIN) 600 MG tablet   Oral   Take 600 mg by mouth 4 (four) times daily.         Marland Kitchen guaiFENesin (MUCINEX) 600 MG 12 hr tablet   Oral   Take 1,200 mg by mouth 2 (two) times daily.         . isosorbide mononitrate (IMDUR) 30 MG 24 hr tablet    Oral   Take 1 tablet (30 mg total) by mouth at bedtime.   30 tablet   11   . metoprolol (LOPRESSOR) 50 MG tablet  Oral   Take 1 tablet (50 mg total) by mouth 2 (two) times daily.   60 tablet   0   . Multiple Vitamin (MULTIVITAMIN WITH MINERALS) TABS   Oral   Take 1 tablet by mouth daily.         . nitroGLYCERIN (NITROSTAT) 0.4 MG SL tablet   Sublingual   Place 0.4 mg under the tongue every 5 (five) minutes as needed for chest pain.         . nortriptyline (PAMELOR) 50 MG capsule   Oral   Take 1 capsule (50 mg total) by mouth at bedtime.   30 capsule   6   . omeprazole (PRILOSEC) 20 MG capsule   Oral   Take 20 mg by mouth 2 (two) times daily.         . ondansetron (ZOFRAN) 4 MG tablet   Oral   Take 4 mg by mouth every 8 (eight) hours as needed for nausea or vomiting.         Marland Kitchen oxyCODONE-acetaminophen (PERCOCET/ROXICET) 5-325 MG per tablet   Oral   Take 2 tablets by mouth every 4 (four) hours as needed for pain.         . potassium chloride SA (K-DUR,KLOR-CON) 20 MEQ tablet   Oral   Take 20 mEq by mouth 2 (two) times daily.         . Simethicone (GAS-X PO)   Oral   Take 1 tablet by mouth daily as needed (for gas).         . simvastatin (ZOCOR) 20 MG tablet   Oral   Take 20 mg by mouth at bedtime.         Marland Kitchen warfarin (COUMADIN) 5 MG tablet   Oral   Take 2.5-5 mg by mouth daily. Take 0.5 tablet Monday, Wednesday and Friday Take 1 tablet Tuesday, Thursday, Saturday and Sunday          BP 119/68  Pulse 130  Temp(Src) 102.3 F (39.1 C) (Oral)  Resp 16  SpO2 82%  Physical Exam  Nursing note and vitals reviewed. Constitutional: She appears well-developed and well-nourished. No distress.  HENT:  Head: Normocephalic and atraumatic.  Mouth/Throat: Oropharynx is clear and moist.  Eyes: Conjunctivae and EOM are normal. Pupils are equal, round, and reactive to light.  Neck: Normal range of motion. Neck supple.  Cardiovascular: Normal rate,  regular rhythm and normal heart sounds.   Pulmonary/Chest: Accessory muscle usage present. Not tachypneic. No respiratory distress. She has no decreased breath sounds. She has wheezes. She has no rhonchi.  Diffuse inspiratory and expiratory wheezes; protecting airway  Abdominal: Soft. Bowel sounds are normal. There is no tenderness. There is no guarding.  Musculoskeletal: Normal range of motion. She exhibits edema (trace pitting edema BLE).  Neurological: She is alert.  Awake, alert, oriented to self and place but some confusion regarding time; answering questions and following commands when prompted; equal strength UE and LE bilaterally; no facial asymmetry  Skin: Skin is warm and dry. She is not diaphoretic.  Psychiatric: She has a normal mood and affect.    ED Course  Procedures (including critical care time)  CRITICAL CARE Performed by: Larene Pickett   Total critical care time: 30  Critical care time was exclusive of separately billable procedures and treating other patients.  Critical care was necessary to treat or prevent imminent or life-threatening deterioration.  Critical care was time spent personally by me on the following activities: development of  treatment plan with patient and/or surrogate as well as nursing, discussions with consultants, evaluation of patient's response to treatment, examination of patient, obtaining history from patient or surrogate, ordering and performing treatments and interventions, ordering and review of laboratory studies, ordering and review of radiographic studies, pulse oximetry and re-evaluation of patient's condition.  Labs Review Labs Reviewed  CBC - Abnormal; Notable for the following:    WBC 3.6 (*)    RBC 2.49 (*)    Hemoglobin 8.1 (*)    HCT 25.8 (*)    MCV 103.6 (*)    RDW 21.0 (*)    All other components within normal limits  PROTIME-INR - Abnormal; Notable for the following:    Prothrombin Time 17.3 (*)    All other  components within normal limits  BLOOD GAS, ARTERIAL - Abnormal; Notable for the following:    pH, Arterial 7.455 (*)    pO2, Arterial 65.7 (*)    Bicarbonate 24.3 (*)    All other components within normal limits  URINE CULTURE  CULTURE, BLOOD (ROUTINE X 2)  CULTURE, BLOOD (ROUTINE X 2)  URINALYSIS, ROUTINE W REFLEX MICROSCOPIC  COMPREHENSIVE METABOLIC PANEL  PRO B NATRIURETIC PEPTIDE  I-STAT CG4 LACTIC ACID, ED  I-STAT TROPOININ, ED   Imaging Review Dg Chest Port 1 View  (if Code Sepsis Called)  10/22/2013   CLINICAL DATA:  Shortness of breath, fever, altered mental status  EXAM: PORTABLE CHEST - 1 VIEW  COMPARISON:  09/06/2013, 08/31/2013  FINDINGS: Mild background emphysema noted. Prior coronary bypass changes. Heart is enlarged with central vascular congestion. No effusion or pneumothorax. Increased patchy airspace opacity in the left hilar region and also the left lower lobe retrocardiac area. This could represent pneumonia. Trachea is midline. Atherosclerosis of the aorta. Artifact overlies the left apex.  IMPRESSION: Cardiomegaly with vascular congestion  Patchy left perihilar and retrocardiac left lower lobe airspace process concerning for pneumonia.   Electronically Signed   By: Daryll Brod M.D.   On: 10/22/2013 08:47     EKG Interpretation None      MDM   Final diagnoses:  Respiratory failure  HCAP (healthcare-associated pneumonia)  CHF exacerbation  COPD (chronic obstructive pulmonary disease)   On arrival patient is febrile, tachycardic, hypotensive, and hypoxic. Code sepsis activated, patient start on broad-spectrum Zosyn and vancomycin.  CBC, CMP,  BNP, lactate, u/a, blood cultures, urine culture obtained.  Fluid bolus started for BP control.  EKG A. Fib, patient has history of same, on coumadin. INR mildly sub-therapeutic at 1.45.  Trop negative.  Labs again with pancytopenia, improved from previous. Hemoglobin 8.1, patient denies any melena.  Lactic acid WNL.  CXR  with pneumonia and pulmonary vascular congestion.  BNP elevated at 5985, pt given 80mg  lasix after second liter bolus.  Pt with respiratory failure, likely secondary to combination of CAP, COPD, and CHF.   She is currently stable 2L via [place] but will need hospital admission for further management.  Discussed with hospitalist, Dr. Wendee Beavers who will admit to telemetry.  Temp admission orders placed.  Larene Pickett, PA-C 10/22/13 [place], PA-C 10/22/13 1100

## 2013-10-22 NOTE — Progress Notes (Addendum)
ANTIBIOTIC CONSULT NOTE - INITIAL  Pharmacy Consult for Vancomycin and Zosyn Indication: Sepsis  No Known Allergies  Patient Measurements: Height: 5' 7.5" (171.5 cm) Weight: 206 lb (93.441 kg) IBW/kg (Calculated) : 62.75 Adjusted Body Weight: 75kg  Vital Signs: Temp: 102.3 F (39.1 C) (04/05 0820) Temp src: Oral (04/05 0820) BP: 115/79 mmHg (04/05 0915) Pulse Rate: 131 (04/05 1000) Intake/Output from previous day:   Intake/Output from this shift:    Labs:  Recent Labs  10/22/13 0844  WBC 3.6*  HGB 8.1*  PLT 153  CREATININE 1.52*   Estimated Creatinine Clearance: 39.6 ml/min (by C-G formula based on Cr of 1.52). No results found for this basename: VANCOTROUGH, Corlis Leak, VANCORANDOM, [place], [place], [place], [place], TOBRAPEAK, TOBRARND, AMIKACINPEAK, AMIKACINTROU, AMIKACIN,  in the last 72 hours   Microbiology: Recent Results (from the past 720 hour(s))  TECHNOLOGIST REVIEW     Status: None   Collection Time    10/02/13  1:31 PM      Result Value Ref Range Status   Technologist Review     Final   Value: Metas and Myelocytes present, Atypical lymphs, Dysplastic eos and neutrophils; few ovalos, teardrops and fragments    Medical History: Past Medical History  Diagnosis Date  . Coronary atherosclerosis of unspecified type of vessel, native or graft   . Atrial fibrillation 01/19/2012    stress test - non-gated secondary to A fib,  normal myocaridal perfusion study  . Unspecified venous (peripheral) insufficiency   . Other and unspecified hyperlipidemia   . Morbid obesity   . Esophageal reflux   . Irritable bowel syndrome   . Other symptoms involving urinary system(788.99)   . Chronic pain syndrome   . 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 Anxiety state, unspecified   . Herpes zoster without mention of complication   . Herpes zoster with other nervous system complications(053.19)   . Lumbago   . Anticoagulated on Coumadin, chronic  for A. Fib 04/08/2012  . COPD (chronic obstructive pulmonary disease)   . 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 Pneumonia 4 years ago  . Bronchitis, chronic   . Fibromyalgia   . Arthritis   . CAD (coronary artery disease) 04/29/2006    echo -EF 45-50%, impaired LV relaxation  . 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 GI bleed 04/07/2012    endoscopy/colonoscopy unremarkable  . 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 DJD (degenerative joint disease)   . Myocardial infarction   . Anemia     Medications:  Anti-infectives   Start     Dose/Rate Route Frequency Ordered Stop   10/22/13 1000  vancomycin (VANCOCIN) IVPB 1000 mg/200 mL premix     1,000 mg 200 mL/hr over 60 Minutes Intravenous  Once 10/22/13 0907     10/22/13 0930  piperacillin-tazobactam (ZOSYN) IVPB 3.375 g     3.375 g 100 mL/hr over 30 Minutes Intravenous STAT 10/22/13 0907 10/22/13 1002     Assessment: 73yo F presenting with fever and AMS. Pharmacy is asked to dose Vanc and Zosyn for suspected sepsis. Recent weight 91kg. Labs in process. Apparent baseline SCr ~1.4.   Goal of Therapy:  Vancomycin trough level 15-20 mcg/ml Appropriate antibiotic dosing for renal function; eradication of infection  Plan:   Give Zosyn 3.375g IV over 11min STAT in ED.  Give Vanc 1g STAT in ED.  F/u lab results and updated weight for further  orders.  Romeo Rabon, PharmD, pager [phone]. 10/22/2013,10:13 AM.  Addendum:  Updated weight 93kg, not changed much. SCr a little higher than baseline. CrCl ~66ml/min(CG), 53ml/min(N).   Zosyn 3.375g IV Q8H infused over 4hrs.  Vanc 750mg  IV q12h.   Measure Vanc trough at steady state.  Follow up renal fxn and culture results.  Romeo Rabon, PharmD, pager [phone]. 10/22/2013,10:18 AM.

## 2013-10-22 NOTE — ED Notes (Signed)
Patient assisted to restroom via [facility]- Patient did urinate, but did not get enough urine in the hat in commode. Patient will state she will try again. PA Lattie Haw.

## 2013-10-22 NOTE — ED Provider Notes (Addendum)
Medical screening examination/treatment/procedure(s) were conducted as a shared visit with non-physician practitioner(s) and myself.  I personally evaluated the patient during the encounter.   EKG Interpretation None     Pt seen and evaluated.  Pt with fever, cough, difficulty breathing.  Pt tachycardic- ekg shows afib with RVR.  CXR shows pneumonia as well as some pulmonary vascular congestion.  Code sepsis initiated on arrival- pt given fluid bolus, started on broad spectrum antibiotics, labs including blood and urine cultures obtained.  Pt initially hypotensive, BP did respond to fluids.  D/w hospitalist for admission.  Pt will need rate control of afib once she is resuscitated further for her sepsis if HR remains elevated.  Pt also treated with albuterol for wheezing/COPD exacerbation.    Threasa Beards, MD 10/22/13 [place], MD 10/22/13 [phone]

## 2013-10-22 NOTE — Progress Notes (Signed)
ANTICOAGULATION CONSULT NOTE - Follow Up Consult  Pharmacy Consult for Heparin Indication: atrial fibrillation, subtherapeutic INR  No Known Allergies  Patient Measurements: Height: 5' 7.5" (171.5 cm) Weight: 207 lb 10.8 oz (94.2 kg) IBW/kg (Calculated) : 62.75 Heparin Dosing Weight:   Vital Signs: Temp: 97.3 F (36.3 C) (04/05 2033) Temp src: Oral (04/05 2033) BP: 127/72 mmHg (04/05 2033) Pulse Rate: 87 (04/05 2033)  Labs:  Recent Labs  10/22/13 0844 10/22/13 2019  HGB 8.1*  --   HCT 25.8*  --   PLT 153  --   LABPROT 17.3*  --   INR 1.45  --   HEPARINUNFRC  --  0.35  CREATININE 1.52*  --     Estimated Creatinine Clearance: 39.8 ml/min (by C-G formula based on Cr of 1.52).   Medications:  Infusions:  . heparin 1,300 Units/hr (10/22/13 1229)    Assessment: Patient with heparin level at goal.  No issues noted per RN.  Goal of Therapy:  Heparin level 0.3-0.7 units/ml Monitor platelets by anticoagulation protocol: Yes   Plan:  Continue heparin at current rate, recheck level with daily heparin level.  Nani Skillern Crowford 10/22/2013,11:07 PM

## 2013-10-22 NOTE — ED Provider Notes (Addendum)
Medical screening examination/treatment/procedure(s) were conducted as a shared visit with non-physician practitioner(s) and myself.  I personally evaluated the patient during the encounter.   EKG Interpretation   Date/Time:  Sunday October 22 2013 08:44:19 EDT Ventricular Rate:  114 PR Interval:    QRS Duration: 90 QT Interval:  311 QTC Calculation: 428 R Axis:   66 Text Interpretation:  Atrial fibrillation Minimal ST depression, anterior  leads No significant change since last tracing Confirmed by Canary Brim  MD,  Karesha Trzcinski [phone]) on 10/22/2013 2:56:35 PM       Threasa Beards, MD 10/22/13 [place], MD 10/22/13 [phone]

## 2013-10-22 NOTE — ED Notes (Addendum)
Pt's husband states that pt has been confused since yesterday.  He states that she has been talking out of her head and not make sense.  Also states that she has had issues with balance.  Pt febrile.  Has had cough and has hx of COPD.  Did a neb this morning at home.  Pt is alert to self and location but has no idea what the month is.

## 2013-10-23 LAB — BASIC METABOLIC PANEL
BUN: 29 mg/dL — AB (ref 6–23)
CHLORIDE: 99 meq/L (ref 96–112)
CO2: 25 mEq/L (ref 19–32)
Calcium: 8.2 mg/dL — ABNORMAL LOW (ref 8.4–10.5)
Creatinine, Ser: 1.6 mg/dL — ABNORMAL HIGH (ref 0.50–1.10)
GFR calc Af Amer: 36 mL/min — ABNORMAL LOW (ref >90)
GFR calc non Af Amer: 31 mL/min — ABNORMAL LOW (ref >90)
GLUCOSE: 133 mg/dL — AB (ref 70–99)
POTASSIUM: 4.3 meq/L (ref 3.7–5.3)
SODIUM: 137 meq/L (ref 137–147)

## 2013-10-23 LAB — URINE CULTURE
Colony Count: NO GROWTH
Culture: NO GROWTH

## 2013-10-23 LAB — CBC
HEMATOCRIT: 23.4 % — AB (ref 36.0–46.0)
HEMOGLOBIN: 7.3 g/dL — AB (ref 12.0–15.0)
MCH: 32.7 pg (ref 26.0–34.0)
MCHC: 31.2 g/dL (ref 30.0–36.0)
MCV: 104.9 fL — ABNORMAL HIGH (ref 78.0–100.0)
Platelets: 123 10*3/uL — ABNORMAL LOW (ref 150–400)
RBC: 2.23 MIL/uL — ABNORMAL LOW (ref 3.87–5.11)
RDW: 20.6 % — ABNORMAL HIGH (ref 11.5–15.5)
WBC: 4.4 10*3/uL (ref 4.0–10.5)

## 2013-10-23 LAB — PROTIME-INR
INR: 1.77 — ABNORMAL HIGH (ref 0.00–1.49)
Prothrombin Time: 20.1 seconds — ABNORMAL HIGH (ref 11.6–15.2)

## 2013-10-23 LAB — PREPARE RBC (CROSSMATCH)

## 2013-10-23 LAB — HEPARIN LEVEL (UNFRACTIONATED)
Heparin Unfractionated: 0.62 IU/mL (ref 0.30–0.70)
Heparin Unfractionated: 0.7 IU/mL (ref 0.30–0.70)

## 2013-10-23 LAB — EXPECTORATED SPUTUM ASSESSMENT W GRAM STAIN, RFLX TO RESP C

## 2013-10-23 LAB — EXPECTORATED SPUTUM ASSESSMENT W REFEX TO RESP CULTURE

## 2013-10-23 MED ORDER — SENNA 8.6 MG PO TABS: 1.0000 | ORAL_TABLET | Freq: Every day | ORAL | Status: DC | PRN

## 2013-10-23 MED ORDER — HEPARIN (PORCINE) IN NACL 100-0.45 UNIT/ML-% IJ SOLN
1200.0000 [IU]/h | INTRAMUSCULAR | Status: DC
  Administered 2013-10-24: 1200 [IU]/h via INTRAVENOUS
  Filled 2013-10-23 (×2): qty 250

## 2013-10-23 MED ORDER — BENZONATATE 100 MG PO CAPS
100.0000 mg | ORAL_CAPSULE | Freq: Three times a day (TID) | ORAL | Status: DC | PRN
  Administered 2013-10-25: 100 mg via ORAL
  Filled 2013-10-23: qty 1

## 2013-10-23 MED ORDER — WARFARIN SODIUM 5 MG PO TABS
5.0000 mg | ORAL_TABLET | Freq: Once | ORAL | Status: AC
  Administered 2013-10-23: 5 mg via ORAL
  Filled 2013-10-23: qty 1

## 2013-10-23 NOTE — Progress Notes (Signed)
[name] PROGRESS NOTE  Kymani Laursen JYN:[id number] DOB: 1940/09/22 DOA: 10/22/2013 PCP: Noralee Space, MD  Assessment/Plan: Principal Problem:   Acute respiratory failure - Multifactorial given anemia, COPD exacerbation, and CHF - Provide supplemental oxygen -  Transfuse 1 unit of packed red blood cells  Sepsis - Resolving on broad spectrum IV antibiotics.  Active Problems:    COPD (chronic obstructive pulmonary disease) - Will continue Xopenex, Dulera, Solu-Medrol, broad-spectrum IV antibiotics - Supplemental oxygen and we'll try to wean to room air  HYPERLIPIDEMIA - Stable continue home regimen    Chronic pain syndrome - Stable, continue current regimen    GERD (gastroesophageal reflux disease) - Stable on Protonix    Anemia, chronic disease - Given patient's cardiac history and hemoglobin of 7.3 we'll plan on transfusing 1 unit of packed red blood cells. This was discussed with patient who was agreeable.    CKD (chronic kidney disease), stage III - Baseline at 1.3-1.4. Currently patient near baseline - On admission patient received 80 mg IV of Lasix    Atrial fibrillation with RVR - Resolved and initially was most likely exacerbated 2 to active infection and decreased intravascular volume as well as lack of beta blocker in the a.m. on admission. - Continue low-dose beta blocker given soft blood pressures - Coumadin per pharmacy consult    S/P CABG (coronary artery bypass graft) - Stable, continue aspirin    Other pancytopenia - Patient to followup with oncologist for results of bone marrow testing.   Code Status: full Family Communication: No family at bedside  Disposition Plan: Pending improvement in respiratory condition   Consultants:  None  Procedures:  None  Antibiotics:  Vanc and Pip/Tazo   HPI/Subjective: Patient has no new complaints. States that she feels better  Objective: Filed Vitals:   10/23/13 1417  BP: 117/57   Pulse: 98  Temp: 97.6 F (36.4 C)  Resp: 20    Intake/Output Summary (Last 24 hours) at 10/23/13 1602 Last data filed at 10/23/13 1545  Gross per 24 hour  Intake   1400 ml  Output   2400 ml  Net  -1000 ml   Filed Weights   10/22/13 0934 10/22/13 1048 10/23/13 0358  Weight: 93.441 kg (206 lb) 94.2 kg (207 lb 10.8 oz) 94.257 kg (207 lb 12.8 oz)    Exam:   General:  Patient in no acute distress, alert and awake. Oriented x2 to person and place but not time. Did know who the President of the [place] was  Cardiovascular: Irregularly irregular, no murmurs rubs  Respiratory: Mild expiratory wheezes bilaterally, no rales, breath sounds auscultated bilaterally with equal chest rise   Abdomen: Soft, nondistended, nontender  Musculoskeletal: No cyanosis or clubbing  Data Reviewed: Basic Metabolic Panel:  Recent Labs Lab 10/22/13 0834 10/22/13 0844 10/23/13 0345  NA  --  132* 137  K  --  4.3 4.3  CL  --  94* 99  CO2  --  25 25  GLUCOSE  --  104* 133*  BUN  --  25* 29*  CREATININE  --  1.52* 1.60*  CALCIUM  --  8.8 8.2*  MG 2.0  --   --   PHOS 2.7  --   --    Liver Function Tests:  Recent Labs Lab 10/22/13 0844  AST 23  ALT 10  ALKPHOS 93  BILITOT 0.9  PROT 7.8  ALBUMIN 3.3*   No results found for this basename: LIPASE, AMYLASE,  in the last 168  hours No results found for this basename: AMMONIA,  in the last 168 hours CBC:  Recent Labs Lab 10/22/13 0844 10/23/13 0345  WBC 3.6* 4.4  HGB 8.1* 7.3*  HCT 25.8* 23.4*  MCV 103.6* 104.9*  PLT 153 123*   Cardiac Enzymes: No results found for this basename: CKTOTAL, CKMB, CKMBINDEX, TROPONINI,  in the last 168 hours BNP (last 3 results)  Recent Labs  09/03/13 1500 09/21/13 1125 10/22/13 0844  PROBNP 14053.0* 203.0* 5985.0*   CBG: No results found for this basename: GLUCAP,  in the last 168 hours  Recent Results (from the past 240 hour(s))  CULTURE, BLOOD (ROUTINE X 2)     Status: None    Collection Time    10/22/13  8:44 AM      Result Value Ref Range Status   Specimen Description BLOOD RIGHT ANTECUBITAL   Final   Special Requests BOTTLES DRAWN AEROBIC AND ANAEROBIC 5 CC EACH   Final   Culture  Setup Time     Final   Value: 10/22/2013 15:20     Performed at [facility]   Culture     Final   Value:        BLOOD CULTURE RECEIVED NO GROWTH TO DATE CULTURE WILL BE HELD FOR 5 DAYS BEFORE ISSUING A FINAL NEGATIVE REPORT     Performed at [facility]   Report Status PENDING   Incomplete  CULTURE, BLOOD (ROUTINE X 2)     Status: None   Collection Time    10/22/13  8:44 AM      Result Value Ref Range Status   Specimen Description BLOOD BLOOD LEFT FOREARM   Final   Special Requests BOTTLES DRAWN AEROBIC AND ANAEROBIC 4 CC [facility]   Final   Culture  Setup Time     Final   Value: 10/22/2013 15:20     Performed at [facility]   Culture     Final   Value:        BLOOD CULTURE RECEIVED NO GROWTH TO DATE CULTURE WILL BE HELD FOR 5 DAYS BEFORE ISSUING A FINAL NEGATIVE REPORT     Performed at [facility]   Report Status PENDING   Incomplete  MRSA PCR SCREENING     Status: Abnormal   Collection Time    10/22/13 11:43 AM      Result Value Ref Range Status   MRSA by PCR POSITIVE (*) NEGATIVE Final   Comment:            The GeneXpert MRSA Assay (FDA     approved for NASAL specimens     only), is one component of a     comprehensive MRSA colonization     surveillance program. It is not     intended to diagnose MRSA     infection nor to guide or     monitor treatment for     MRSA infections.     RESULT CALLED TO, READ BACK BY AND VERIFIED WITH:     D.VAUDREUIL AT 1504 ON [id number] BY C.BONGEL  CULTURE, EXPECTORATED SPUTUM-ASSESSMENT     Status: None   Collection Time    10/23/13 11:10 AM      Result Value Ref Range Status   Specimen Description SPUTUM   Final   Special Requests NONE   Final   Sputum evaluation     Final   Value: THIS SPECIMEN IS  ACCEPTABLE. RESPIRATORY CULTURE REPORT TO FOLLOW.  Report Status 10/23/2013 FINAL   Final     Studies: Dg Chest Port 1 View  (if Code Sepsis Called)  10/22/2013   CLINICAL DATA:  Shortness of breath, fever, altered mental status  EXAM: PORTABLE CHEST - 1 VIEW  COMPARISON:  09/06/2013, 08/31/2013  FINDINGS: Mild background emphysema noted. Prior coronary bypass changes. Heart is enlarged with central vascular congestion. No effusion or pneumothorax. Increased patchy airspace opacity in the left hilar region and also the left lower lobe retrocardiac area. This could represent pneumonia. Trachea is midline. Atherosclerosis of the aorta. Artifact overlies the left apex.  IMPRESSION: Cardiomegaly with vascular congestion  Patchy left perihilar and retrocardiac left lower lobe airspace process concerning for pneumonia.   Electronically Signed   By: Daryll Brod M.D.   On: 10/22/2013 08:47    Scheduled Meds: . aspirin EC  81 mg Oral QPC supper  . Chlorhexidine Gluconate Cloth  6 each Topical Q0600  . docusate sodium  200 mg Oral QHS  . methylPREDNISolone (SOLU-MEDROL) injection  60 mg Intravenous Daily  . metoprolol  12.5 mg Oral BID  . mometasone-formoterol  2 puff Inhalation BID  . mupirocin ointment  1 application Nasal BID  . nortriptyline  50 mg Oral QHS  . pantoprazole  40 mg Oral Daily  . piperacillin-tazobactam (ZOSYN)  IV  3.375 g Intravenous 3 times per day  . simvastatin  20 mg Oral QHS  . vancomycin  750 mg Intravenous Q12H  . warfarin  5 mg Oral ONCE-1800  . Warfarin - Pharmacist Dosing Inpatient   Does not apply q1800   Continuous Infusions: . heparin 1,200 Units/hr (10/23/13 1300)      Time spent: > 35 minutes    Velvet Bathe  [name] Pager [phone]. If 7PM-7AM, please contact night-coverage at [email or web address], password [facility] 10/23/2013, 4:02 PM  LOS: 1 day

## 2013-10-23 NOTE — Progress Notes (Signed)
[place], RN, BSN, CCM  [phone]  Chart Reviewed for discharge and hospital needs.  Discharge needs at time of review: None present will follow for needs.  Review of patient progress due on [id number].

## 2013-10-23 NOTE — Progress Notes (Addendum)
ANTICOAGULATION CONSULT NOTE - Initial Consult  Pharmacy Consult for Heparin, warfarin Indication: atrial fibrillation, subtherapeutic INR  No Known Allergies  Patient Measurements: Height: 5' 7.5" (171.5 cm) Weight: 207 lb 12.8 oz (94.257 kg) IBW/kg (Calculated) : 62.75 Heparin Dosing Weight: 83 kg  Vital Signs: Temp: 97.4 F (36.3 C) (04/06 0603) Temp src: Oral (04/06 0603) BP: 119/81 mmHg (04/06 0603) Pulse Rate: 80 (04/06 0603)  Labs:  Recent Labs  10/22/13 0844 10/22/13 2019 10/23/13 0345  HGB 8.1*  --  7.3*  HCT 25.8*  --  23.4*  PLT 153  --  123*  LABPROT 17.3*  --  20.1*  INR 1.45  --  1.77*  HEPARINUNFRC  --  0.35 0.62  CREATININE 1.52*  --  1.60*    Estimated Creatinine Clearance: 37.8 ml/min (by C-G formula based on Cr of 1.6).   Medical History: Past Medical History  Diagnosis Date  . Coronary atherosclerosis of unspecified type of vessel, native or graft   . Atrial fibrillation 01/19/2012    stress test - non-gated secondary to A fib,  normal myocaridal perfusion study  . Unspecified venous (peripheral) insufficiency   . Other and unspecified hyperlipidemia   . Morbid obesity   . Esophageal reflux   . Irritable bowel syndrome   . Other symptoms involving urinary system(788.99)   . Chronic pain syndrome   . 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 Anxiety state, unspecified   . Herpes zoster without mention of complication   . Herpes zoster with other nervous system complications(053.19)   . Lumbago   . Anticoagulated on Coumadin, chronic for A. Fib 04/08/2012  . COPD (chronic obstructive pulmonary disease)   . 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 Pneumonia 4 years ago  . Bronchitis, chronic   . Fibromyalgia   . Arthritis   . CAD (coronary artery disease) 04/29/2006    echo -EF 45-50%, impaired LV relaxation  . 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 GI  bleed 04/07/2012    endoscopy/colonoscopy unremarkable  . 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 DJD (degenerative joint disease)   . Myocardial infarction   . Anemia     Medications:  Scheduled:  . aspirin EC  81 mg Oral QPC supper  . Chlorhexidine Gluconate Cloth  6 each Topical Q0600  . docusate sodium  200 mg Oral QHS  . methylPREDNISolone (SOLU-MEDROL) injection  60 mg Intravenous Daily  . metoprolol  12.5 mg Oral BID  . mometasone-formoterol  2 puff Inhalation BID  . mupirocin ointment  1 application Nasal BID  . nortriptyline  50 mg Oral QHS  . pantoprazole  40 mg Oral Daily  . piperacillin-tazobactam (ZOSYN)  IV  3.375 g Intravenous 3 times per day  . simvastatin  20 mg Oral QHS  . vancomycin  750 mg Intravenous Q12H  . Warfarin - Pharmacist Dosing Inpatient   Does not apply q1800   Infusions:  . heparin 1,300 Units/hr (10/23/13 0618)    Assessment: 2 yoF presented [facility] ED 4/5 with altered mental status and fever.  PMH includes Afib on chronic anticoagulation with warfarin.  PTA warfarin dosing is $RemoveB'5mg'ecVejLIK$  daily except 2.$RemoveBefore'5mg'rVejKoVFsQqfK$  on Mon, Wed, Friday.  Her last dose was 10/21/13 and her INR on admission is subtherapeutic.  Pharmacy is consulted to dose IV heparin and warfarin.  4.6:   INR trending up - today 1.77  Heparin  level 0.62 - therapeutic, but rising  CBC: Hgb and plts decreasing -  Hgb 7.3, Plt 123 (History of pancytopenia, s/p bone marrow biopsy on 10/11/13.)  No bleeding noted by RN  CKD-III - SCr 1.6, CrCl ~ 38 ml/min   Goal of Therapy:  INR 2-3 Heparin level 0.3-0.7 units/ml Monitor platelets by anticoagulation protocol: Yes   Plan:   Warfarin 5 PO tonight x1 dose  Continue heparin IV infusion at 1300 units/hr (13 ml/hr)  Recheck HL in 6 hours   Daily INR, heparin level, and CBC  Continue to monitor H&H and plts  Ralene Bathe, PharmD, BCPS 10/23/2013, 7:40 AM  Pager: [phone]   ADDENDUM:  Heparin level continues to rise - 11AM level = 0.70, upper  range of therapeutic.  Will empirically decrease infusion to 1200 units/hr = 12 ml/hr and f/u 8 hour heparin level.    Ralene Bathe, PharmD, BCPS 10/23/2013, 12:53 PM  Pager: [phone]

## 2013-10-23 NOTE — Clinical Documentation Improvement (Signed)
PLEASE SPECIFY TYPE AND ACUITY OF CHF Possible Clinical Conditions?  Chronic Systolic Congestive Heart Failure Chronic Diastolic Congestive Heart Failure Chronic Systolic & Diastolic Congestive Heart Failure Acute Systolic Congestive Heart Failure Acute Diastolic Congestive Heart Failure Acute Systolic & Diastolic Congestive Heart Failure Acute on Chronic Systolic Congestive Heart Failure Acute on Chronic Diastolic Congestive Heart Failure Acute on Chronic Systolic & Diastolic Congestive Heart Failure Other Condition Cannot Clinically Determine  Supporting Information: (As per notes) "CHF exacerbation"  - CXR shows: cardiomegaly with vascular congestion,  Thank You, Alessandra Grout, RN, BSN, CCDS, Clinical Documentation Specialist:  [phone]   [phone] [place]- Health Information Management

## 2013-10-24 ENCOUNTER — Ambulatory Visit: Payer: Medicare Other | Admitting: Internal Medicine

## 2013-10-24 ENCOUNTER — Other Ambulatory Visit: Payer: Medicare Other

## 2013-10-24 LAB — BASIC METABOLIC PANEL
BUN: 30 mg/dL — ABNORMAL HIGH (ref 6–23)
CHLORIDE: 96 meq/L (ref 96–112)
CO2: 22 mEq/L (ref 19–32)
Calcium: 8.3 mg/dL — ABNORMAL LOW (ref 8.4–10.5)
Creatinine, Ser: 1.45 mg/dL — ABNORMAL HIGH (ref 0.50–1.10)
GFR calc non Af Amer: 35 mL/min — ABNORMAL LOW (ref >90)
GFR, EST AFRICAN AMERICAN: 41 mL/min — AB (ref >90)
Glucose, Bld: 93 mg/dL (ref 70–99)
POTASSIUM: 4.1 meq/L (ref 3.7–5.3)
Sodium: 134 mEq/L — ABNORMAL LOW (ref 137–147)

## 2013-10-24 LAB — CBC
HCT: 27 % — ABNORMAL LOW (ref 36.0–46.0)
HEMOGLOBIN: 8.6 g/dL — AB (ref 12.0–15.0)
MCH: 31.7 pg (ref 26.0–34.0)
MCHC: 31.9 g/dL (ref 30.0–36.0)
MCV: 99.6 fL (ref 78.0–100.0)
Platelets: 145 10*3/uL — ABNORMAL LOW (ref 150–400)
RBC: 2.71 MIL/uL — AB (ref 3.87–5.11)
RDW: 23.4 % — ABNORMAL HIGH (ref 11.5–15.5)
WBC: 3.5 10*3/uL — ABNORMAL LOW (ref 4.0–10.5)

## 2013-10-24 LAB — PROTIME-INR
INR: 2.06 — ABNORMAL HIGH (ref 0.00–1.49)
PROTHROMBIN TIME: 22.6 s — AB (ref 11.6–15.2)

## 2013-10-24 LAB — HEPARIN LEVEL (UNFRACTIONATED): Heparin Unfractionated: 0.47 IU/mL (ref 0.30–0.70)

## 2013-10-24 MED ORDER — OXYCODONE HCL 5 MG PO TABS
10.0000 mg | ORAL_TABLET | Freq: Once | ORAL | Status: AC
  Administered 2013-10-24: 10 mg via ORAL
  Filled 2013-10-24: qty 2

## 2013-10-24 MED ORDER — FUROSEMIDE 40 MG PO TABS
40.0000 mg | ORAL_TABLET | Freq: Every day | ORAL | Status: DC
  Administered 2013-10-24: 40 mg via ORAL
  Filled 2013-10-24 (×3): qty 1

## 2013-10-24 MED ORDER — PHENOL 1.4 % MT LIQD
1.0000 | OROMUCOSAL | Status: DC | PRN
  Administered 2013-10-24: 1 via OROMUCOSAL
  Filled 2013-10-24 (×2): qty 177

## 2013-10-24 MED ORDER — WARFARIN SODIUM 5 MG PO TABS
5.0000 mg | ORAL_TABLET | Freq: Once | ORAL | Status: AC
  Administered 2013-10-24: 5 mg via ORAL
  Filled 2013-10-24: qty 1

## 2013-10-24 MED ORDER — METOPROLOL TARTRATE 1 MG/ML IV SOLN
5.0000 mg | Freq: Once | INTRAVENOUS | Status: AC
  Administered 2013-10-24: 5 mg via INTRAVENOUS
  Filled 2013-10-24: qty 5

## 2013-10-24 NOTE — Evaluation (Signed)
Physical Therapy Evaluation Patient Details Name: [name] MRN: [id number] DOB: 07-07-1941 Today's Date: 10/24/2013   History of Present Illness  73 yo female admitted with AMS, fever, acute resp failure. Hx of HTN, CAD, A fib, COPD, morbid obesity, fibromyalgia  Clinical Impression  On eval, pt required Min guard assist for mobility-able to ambulate ~60 feet with walker. Fatigues easily. O2 sats 83% on RA. Replaced [place] O2 at end of session.     Follow Up Recommendations Home health PT    Equipment Recommendations  None recommended by PT    Recommendations for Other Services       Precautions / Restrictions Precautions Precautions: Fall Restrictions Weight Bearing Restrictions: No      Mobility  Bed Mobility Overal bed mobility: Modified Independent                Transfers Overall transfer level: Needs assistance   Transfers: Sit to/from Stand Sit to Stand: Supervision            Ambulation/Gait Ambulation/Gait assistance: Min guard Ambulation Distance (Feet): 60 Feet Assistive device: Rolling walker (2 wheeled) Gait Pattern/deviations: Step-through pattern;Decreased stride length     General Gait Details: slow gait speed. dyspnea 3/4. O2 sats 83% on RA. Fatigues easily.   Stairs            Wheelchair Mobility    Modified Rankin (Stroke Patients Only)       Balance                                             Pertinent Vitals/Pain No c/o pain    Home Living Family/patient expects to be discharged to:: Private residence Living Arrangements: Spouse/significant other Available Help at Discharge: Family Type of Home: House Home Access: Ramped entrance;Stairs to enter Entrance Stairs-Rails: Right;Left;Can reach both Entrance Stairs-Number of Steps: [place]: One [place]: [place] - 2 wheels;Wheelchair - Regulatory affairs officer - single point;Bedside commode;Hand held shower head;Grab bars -  tub/shower      Prior Function Level of Independence: Needs assistance      ADL's / Homemaking Assistance Needed: Requires assist with IADLs        Hand Dominance   Dominant Hand: Right    Extremity/Trunk Assessment   Upper Extremity Assessment: Generalized weakness           Lower Extremity Assessment: Generalized weakness      Cervical / Trunk Assessment: Normal  Communication   Communication: No difficulties  Cognition Arousal/Alertness: Awake/alert Behavior During Therapy: WFL for tasks assessed/performed Overall Cognitive Status: Within Functional Limits for tasks assessed                      General Comments      Exercises        Assessment/Plan    PT Assessment Patient needs continued PT services  PT Diagnosis Difficulty walking;Generalized weakness   PT Problem List Decreased strength;Decreased activity tolerance;Decreased mobility;Obesity;Pain  PT Treatment Interventions DME instruction;Gait training;Functional mobility training;Therapeutic activities;Therapeutic exercise;Patient/family education   PT Goals (Current goals can be found in the Care Plan section) Acute Rehab PT Goals Patient Stated Goal: regain strength. home soon PT Goal Formulation: With patient/family Time For Goal Achievement: 11/07/13 Potential to Achieve Goals: Good    Frequency Min 3X/week   Barriers to discharge        Co-evaluation  End of Session Equipment Utilized During Treatment: Gait belt Activity Tolerance: Patient limited by fatigue Patient left: in chair;with call bell/phone within reach;with family/visitor present           Time: 2637-8588 PT Time Calculation (min): 21 min   Charges:   PT Evaluation $Initial PT Evaluation Tier I: 1 Procedure PT Treatments $Gait Training: 8-22 mins   PT G Codes:          Weston Anna, MPT Pager: [phone]

## 2013-10-24 NOTE — Progress Notes (Signed)
ANTICOAGULATION CONSULT NOTE - Consult  Pharmacy Consult for warfarin Indication: atrial fibrillation  No Known Allergies  Patient Measurements: Height: 5' 7.5" (171.5 cm) Weight: 211 lb 6.4 oz (95.89 kg) IBW/kg (Calculated) : 62.75 Heparin Dosing Weight: 83 kg  Vital Signs: Temp: 98.5 F (36.9 C) (04/07 0559) Temp src: Oral (04/07 0559) BP: 107/52 mmHg (04/07 0559) Pulse Rate: 88 (04/07 0559)  Labs:  Recent Labs  10/22/13 0844  10/23/13 0345 10/23/13 1155 10/24/13 0139  HGB 8.1*  --  7.3*  --  8.6*  HCT 25.8*  --  23.4*  --  27.0*  PLT 153  --  123*  --  145*  LABPROT 17.3*  --  20.1*  --  22.6*  INR 1.45  --  1.77*  --  2.06*  HEPARINUNFRC  --   < > 0.62 0.70 0.47  CREATININE 1.52*  --  1.60*  --  1.45*  < > = values in this interval not displayed.  Estimated Creatinine Clearance: 42.1 ml/min (by C-G formula based on Cr of 1.45).   Medical History: Past Medical History  Diagnosis Date  . Coronary atherosclerosis of unspecified type of vessel, native or graft   . Atrial fibrillation 01/19/2012    stress test - non-gated secondary to A fib,  normal myocaridal perfusion study  . Unspecified venous (peripheral) insufficiency   . Other and unspecified hyperlipidemia   . Morbid obesity   . Esophageal reflux   . Irritable bowel syndrome   . Other symptoms involving urinary system(788.99)   . Chronic pain syndrome   . 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 Anxiety state, unspecified   . Herpes zoster without mention of complication   . Herpes zoster with other nervous system complications(053.19)   . Lumbago   . Anticoagulated on Coumadin, chronic for A. Fib 04/08/2012  . COPD (chronic obstructive pulmonary disease)   . 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 Pneumonia 4 years ago  . Bronchitis, chronic   . Fibromyalgia   . Arthritis   . CAD (coronary artery disease) 04/29/2006    echo -EF 45-50%, impaired LV relaxation   . 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 GI bleed 04/07/2012    endoscopy/colonoscopy unremarkable  . 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 DJD (degenerative joint disease)   . Myocardial infarction   . Anemia     Medications:  Scheduled:  . aspirin EC  81 mg Oral QPC supper  . Chlorhexidine Gluconate Cloth  6 each Topical Q0600  . docusate sodium  200 mg Oral QHS  . methylPREDNISolone (SOLU-MEDROL) injection  60 mg Intravenous Daily  . metoprolol  12.5 mg Oral BID  . mometasone-formoterol  2 puff Inhalation BID  . mupirocin ointment  1 application Nasal BID  . nortriptyline  50 mg Oral QHS  . pantoprazole  40 mg Oral Daily  . piperacillin-tazobactam (ZOSYN)  IV  3.375 g Intravenous 3 times per day  . simvastatin  20 mg Oral QHS  . vancomycin  750 mg Intravenous Q12H  . Warfarin - Pharmacist Dosing Inpatient   Does not apply q1800   Infusions:     Assessment: 49 yoF presented to [facility] ED 4/5 with altered mental status and fever.  PMH includes Afib on chronic anticoagulation with warfarin.  PTA warfarin dosing is 77m daily except 2.537mon Mon, Wed, Friday.  Her last dose was 10/21/13  and her INR on admission is subtherapeutic.  Pharmacy initially consulted to dose IV heparin and warfarin, now IV heparin stopped 4/7 as INR >2 and pt needing blood transfusion.    4.7:   INR therapeutic  CBC: Hgb improved after 1 unit PRBCs 4/6 - today 8.6. Plts also up to 145 (History of pancytopenia, s/p bone marrow biopsy on 10/11/13.)  No bleeding noted by RN  CKD-III - appears at baseline   Goal of Therapy:  INR 2-3 Monitor platelets by anticoagulation protocol: Yes   Plan:   Repeat Warfarin 5 PO tonight x1 dose  Daily INR  Continue to monitor H&H and plts  Ralene Bathe, PharmD, BCPS 10/24/2013, 10:10 AM  Pager: [phone]

## 2013-10-24 NOTE — Progress Notes (Signed)
ANTICOAGULATION CONSULT NOTE - Follow Up Consult  Pharmacy Consult for Heparin Indication: atrial fibrillation, subtherapeutic INR   No Known Allergies  Patient Measurements: Height: 5' 7.5" (171.5 cm) Weight: 207 lb 12.8 oz (94.257 kg) IBW/kg (Calculated) : 62.75 Heparin Dosing Weight:   Vital Signs: Temp: 99.8 F (37.7 C) (04/07 0214) Temp src: Oral (04/07 0214) BP: 134/60 mmHg (04/07 0013) Pulse Rate: 140 (04/07 0013)  Labs:  Recent Labs  10/22/13 0844  10/23/13 0345 10/23/13 1155 10/24/13 0139  HGB 8.1*  --  7.3*  --  8.6*  HCT 25.8*  --  23.4*  --  27.0*  PLT 153  --  123*  --  145*  LABPROT 17.3*  --  20.1*  --  22.6*  INR 1.45  --  1.77*  --  2.06*  HEPARINUNFRC  --   < > 0.62 0.70 0.47  CREATININE 1.52*  --  1.60*  --  1.45*  < > = values in this interval not displayed.  Estimated Creatinine Clearance: 41.7 ml/min (by C-G formula based on Cr of 1.45).   Medications:  Infusions:  . heparin 1,200 Units/hr (10/23/13 1300)    Assessment: Patient with heparin at goal.  No issues per RN. Patient has had low Hgb, has gotten PRBC.  Goal of Therapy:  Heparin level 0.3-0.7 units/ml Monitor platelets by anticoagulation protocol: Yes   Plan:  Patient with INR >2 now, in discussion with NP, will d/c heparin drip and protocol.  Will d/c heparin labs as well.  Tyler Deis, Shea Stakes [place] 10/24/2013,3:02 AM

## 2013-10-24 NOTE — Progress Notes (Signed)
[name] PROGRESS NOTE  [name] [id number]:[id number] DOB: [birth year] DOA: 10/22/2013 PCP: Noralee Space, MD Brief Narrative: Patient is a 73 year old Caucasian female with history of CHF, CAD status post CABG, and COPD who presented to the hospital with altered mental status. Found to have sepsis in the ED and currently being treated for sepsis with broad-spectrum antibiotics presumed PNA as source.  Assessment/Plan: Principal Problem:   Acute respiratory failure - Multifactorial given anemia, COPD exacerbation, and CHF - Provide supplemental oxygen - Pt s/p 1 unit of PRBC - Will continue patient's Lasix at 40 mg by mouth daily given history of CHF  Sepsis - Resolving on broad spectrum IV antibiotics. - Patient spiked another fever as such we'll continue IV antibiotics. Once patient is afebrile for 24-48 hours may consider changing to oral regimen  Active Problems:    COPD (chronic obstructive pulmonary disease) - Will continue Xopenex, Dulera, Solu-Medrol, broad-spectrum IV antibiotics - Supplemental oxygen and we'll try to wean to room air  HYPERLIPIDEMIA - Stable continue home regimen    Chronic pain syndrome - Stable, continue current regimen    GERD (gastroesophageal reflux disease) - Stable on Protonix    Anemia, chronic disease - Patient was transfused one unit of packed red blood cells and had an increase in hemoglobin from 7.3 to 8.6    CKD (chronic kidney disease), stage III - Baseline at 1.3-1.4. Currently patient near baseline - Will continue to monitor serum creatinine    Atrial fibrillation with RVR - Resolved and initially was most likely exacerbated 2 to active infection and decreased intravascular volume as well as lack of beta blocker in the a.m. on admission. - Coumadin per pharmacy consult and currently therapeutic as such heparin drip discontinued - Continue beta blocker    S/P CABG (coronary artery bypass graft) - Stable, continue  aspirin    Other pancytopenia - Patient to followup with oncologist for results of bone marrow testing.   Code Status: full Family Communication: No family at bedside  Disposition Plan: Pending improvement in respiratory condition   Consultants:  None  Procedures:  None  Antibiotics:  Vanc and Pip/Tazo   HPI/Subjective: Patient has no new complaints. No acute issues overnight reported. Mental status improved from yesterday  Objective: Filed Vitals:   10/24/13 1357  BP: 137/62  Pulse: 99  Temp: 98.2 F (36.8 C)  Resp: 20    Intake/Output Summary (Last 24 hours) at 10/24/13 1533 Last data filed at 10/24/13 1220  Gross per 24 hour  Intake 1254.05 ml  Output    600 ml  Net 654.05 ml   Filed Weights   10/22/13 1048 10/23/13 0358 10/24/13 0630  Weight: 94.2 kg (207 lb 10.8 oz) 94.257 kg (207 lb 12.8 oz) 95.89 kg (211 lb 6.4 oz)    Exam:   General:  Patient in no acute distress, alert and awake.   Cardiovascular: Irregularly irregular, no murmurs rubs  Respiratory: Mild expiratory wheezes bilaterally, + rales, breath sounds auscultated bilaterally with equal chest rise   Abdomen: Soft, nondistended, nontender  Musculoskeletal: No cyanosis or clubbing  Data Reviewed: Basic Metabolic Panel:  Recent Labs Lab 10/22/13 0834 10/22/13 0844 10/23/13 0345 10/24/13 0139  NA  --  132* 137 134*  K  --  4.3 4.3 4.1  CL  --  94* 99 96  CO2  --  25 25 22   GLUCOSE  --  104* 133* 93  BUN  --  25* 29* 30*  CREATININE  --  1.52* 1.60* 1.45*  CALCIUM  --  8.8 8.2* 8.3*  MG 2.0  --   --   --   PHOS 2.7  --   --   --    Liver Function Tests:  Recent Labs Lab 10/22/13 0844  AST 23  ALT 10  ALKPHOS 93  BILITOT 0.9  PROT 7.8  ALBUMIN 3.3*   No results found for this basename: LIPASE, AMYLASE,  in the last 168 hours No results found for this basename: AMMONIA,  in the last 168 hours CBC:  Recent Labs Lab 10/22/13 0844 10/23/13 0345 10/24/13 0139   WBC 3.6* 4.4 3.5*  HGB 8.1* 7.3* 8.6*  HCT 25.8* 23.4* 27.0*  MCV 103.6* 104.9* 99.6  PLT 153 123* 145*   Cardiac Enzymes: No results found for this basename: CKTOTAL, CKMB, CKMBINDEX, TROPONINI,  in the last 168 hours BNP (last 3 results)  Recent Labs  09/03/13 1500 09/21/13 1125 10/22/13 0844  PROBNP 14053.0* 203.0* 5985.0*   CBG: No results found for this basename: GLUCAP,  in the last 168 hours  Recent Results (from the past 240 hour(s))  CULTURE, BLOOD (ROUTINE X 2)     Status: None   Collection Time    10/22/13  8:44 AM      Result Value Ref Range Status   Specimen Description BLOOD RIGHT ANTECUBITAL   Final   Special Requests BOTTLES DRAWN AEROBIC AND ANAEROBIC 5 CC EACH   Final   Culture  Setup Time     Final   Value: 10/22/2013 15:20     Performed at [facility]   Culture     Final   Value:        BLOOD CULTURE RECEIVED NO GROWTH TO DATE CULTURE WILL BE HELD FOR 5 DAYS BEFORE ISSUING A FINAL NEGATIVE REPORT     Performed at [facility]   Report Status PENDING   Incomplete  CULTURE, BLOOD (ROUTINE X 2)     Status: None   Collection Time    10/22/13  8:44 AM      Result Value Ref Range Status   Specimen Description BLOOD BLOOD LEFT FOREARM   Final   Special Requests BOTTLES DRAWN AEROBIC AND ANAEROBIC 4 CC [facility]   Final   Culture  Setup Time     Final   Value: 10/22/2013 15:20     Performed at [facility]   Culture     Final   Value:        BLOOD CULTURE RECEIVED NO GROWTH TO DATE CULTURE WILL BE HELD FOR 5 DAYS BEFORE ISSUING A FINAL NEGATIVE REPORT     Performed at [facility]   Report Status PENDING   Incomplete  URINE CULTURE     Status: None   Collection Time    10/22/13 11:41 AM      Result Value Ref Range Status   Specimen Description URINE, CLEAN CATCH   Final   Special Requests NONE   Final   Culture  Setup Time     Final   Value: 10/22/2013 19:23     Performed at [place]     Final    Value: NO GROWTH     Performed at [facility]   Culture     Final   Value: NO GROWTH     Performed at [facility]   Report Status 10/23/2013 FINAL   Final  MRSA PCR  SCREENING     Status: Abnormal   Collection Time    10/22/13 11:43 AM      Result Value Ref Range Status   MRSA by PCR POSITIVE (*) NEGATIVE Final   Comment:            The GeneXpert MRSA Assay (FDA     approved for NASAL specimens     only), is one component of a     comprehensive MRSA colonization     surveillance program. It is not     intended to diagnose MRSA     infection nor to guide or     monitor treatment for     MRSA infections.     RESULT CALLED TO, READ BACK BY AND VERIFIED WITH:     D.VAUDREUIL AT 1504 ON [id number] BY C.BONGEL  CULTURE, EXPECTORATED SPUTUM-ASSESSMENT     Status: None   Collection Time    10/23/13 11:10 AM      Result Value Ref Range Status   Specimen Description SPUTUM   Final   Special Requests NONE   Final   Sputum evaluation     Final   Value: THIS SPECIMEN IS ACCEPTABLE. RESPIRATORY CULTURE REPORT TO FOLLOW.   Report Status 10/23/2013 FINAL   Final  CULTURE, RESPIRATORY (NON-EXPECTORATED)     Status: None   Collection Time    10/23/13 11:10 AM      Result Value Ref Range Status   Specimen Description SPUTUM   Final   Special Requests NONE   Final   Gram Stain     Final   Value: NO WBC SEEN     NO SQUAMOUS EPITHELIAL CELLS SEEN     NO ORGANISMS SEEN     Performed at [facility]   Culture     Final   Value: NORMAL OROPHARYNGEAL FLORA     Performed at [facility]   Report Status PENDING   Incomplete     Studies: No results found.  Scheduled Meds: . aspirin EC  81 mg Oral QPC supper  . Chlorhexidine Gluconate Cloth  6 each Topical Q0600  . docusate sodium  200 mg Oral QHS  . furosemide  40 mg Oral Daily  . methylPREDNISolone (SOLU-MEDROL) injection  60 mg Intravenous Daily  . metoprolol  12.5 mg Oral BID  . mometasone-formoterol   2 puff Inhalation BID  . mupirocin ointment  1 application Nasal BID  . nortriptyline  50 mg Oral QHS  . pantoprazole  40 mg Oral Daily  . piperacillin-tazobactam (ZOSYN)  IV  3.375 g Intravenous 3 times per day  . simvastatin  20 mg Oral QHS  . vancomycin  750 mg Intravenous Q12H  . warfarin  5 mg Oral ONCE-1800  . Warfarin - Pharmacist Dosing Inpatient   Does not apply q1800   Continuous Infusions:      Time spent: > 35 minutes    Velvet Bathe  [name] Pager [phone]. If 7PM-7AM, please contact night-coverage at [email or web address], password [facility] 10/24/2013, 3:33 PM  LOS: 2 days

## 2013-10-24 NOTE — Progress Notes (Signed)
Pt c/o chest pain radiating to her right arm/shoulder 10/10 pain scale, denies SOB. Pt refused Nitroglycerine "Its not my heart" as per patient. EKG done- Afib with RVR-131. Noted pt HR in 140's-150's when OOB and staying at 130's when at rest. On call NP made aware- new order received for Lopressor 5 mg IV.Patient's HR in 100-110 at this time at rest. Will continue to monitor.  10/24/13 0013  Vitals  Temp ! 101.4 F (38.6 C)  BP 134/60 mmHg  Pulse Rate ! 140  Resp 20  Pain Assessment  Pain Assessment 0-10  Pain Score 10  Pain Type Acute pain  Pain Location Chest  Pain Descriptors / Indicators Aching  Pain Intervention(s) Refused;Emotional support;Other (Comment) (ekg done-Afib woth RVR-paged on call.)

## 2013-10-24 NOTE — Progress Notes (Signed)
Small hematoma noted on left mid back pt does not remember hitting it on anything.

## 2013-10-25 DIAGNOSIS — J189 Pneumonia, unspecified organism: Secondary | ICD-10-CM

## 2013-10-25 DIAGNOSIS — I4891 Unspecified atrial fibrillation: Secondary | ICD-10-CM

## 2013-10-25 DIAGNOSIS — D638 Anemia in other chronic diseases classified elsewhere: Secondary | ICD-10-CM

## 2013-10-25 LAB — CULTURE, RESPIRATORY W GRAM STAIN
Culture: NORMAL
Gram Stain: NONE SEEN

## 2013-10-25 LAB — BASIC METABOLIC PANEL
BUN: 30 mg/dL — ABNORMAL HIGH (ref 6–23)
CHLORIDE: 99 meq/L (ref 96–112)
CO2: 29 mEq/L (ref 19–32)
Calcium: 8.4 mg/dL (ref 8.4–10.5)
Creatinine, Ser: 1.5 mg/dL — ABNORMAL HIGH (ref 0.50–1.10)
GFR, EST AFRICAN AMERICAN: 39 mL/min — AB (ref >90)
GFR, EST NON AFRICAN AMERICAN: 34 mL/min — AB (ref >90)
Glucose, Bld: 95 mg/dL (ref 70–99)
POTASSIUM: 4.2 meq/L (ref 3.7–5.3)
SODIUM: 140 meq/L (ref 137–147)

## 2013-10-25 LAB — CBC
HCT: 27.4 % — ABNORMAL LOW (ref 36.0–46.0)
Hemoglobin: 8.7 g/dL — ABNORMAL LOW (ref 12.0–15.0)
MCH: 31.5 pg (ref 26.0–34.0)
MCHC: 31.8 g/dL (ref 30.0–36.0)
MCV: 99.3 fL (ref 78.0–100.0)
Platelets: 116 10*3/uL — ABNORMAL LOW (ref 150–400)
RBC: 2.76 MIL/uL — ABNORMAL LOW (ref 3.87–5.11)
RDW: 23.1 % — AB (ref 11.5–15.5)
WBC: 3.1 10*3/uL — ABNORMAL LOW (ref 4.0–10.5)

## 2013-10-25 LAB — CULTURE, RESPIRATORY

## 2013-10-25 LAB — PROTIME-INR
INR: 2.18 — AB (ref 0.00–1.49)
PROTHROMBIN TIME: 23.6 s — AB (ref 11.6–15.2)

## 2013-10-25 LAB — VANCOMYCIN, TROUGH: Vancomycin Tr: 27.3 ug/mL (ref 10.0–20.0)

## 2013-10-25 MED ORDER — FUROSEMIDE 40 MG PO TABS
40.0000 mg | ORAL_TABLET | Freq: Two times a day (BID) | ORAL | Status: DC
  Filled 2013-10-25 (×2): qty 1

## 2013-10-25 MED ORDER — METOPROLOL TARTRATE 50 MG PO TABS
50.0000 mg | ORAL_TABLET | Freq: Two times a day (BID) | ORAL | Status: DC
  Administered 2013-10-25 – 2013-10-26 (×3): 50 mg via ORAL
  Filled 2013-10-25 (×4): qty 1

## 2013-10-25 MED ORDER — METOPROLOL TARTRATE 50 MG PO TABS
50.0000 mg | ORAL_TABLET | Freq: Two times a day (BID) | ORAL | Status: DC
  Filled 2013-10-25: qty 1

## 2013-10-25 MED ORDER — FUROSEMIDE 40 MG PO TABS
40.0000 mg | ORAL_TABLET | Freq: Two times a day (BID) | ORAL | Status: DC
  Administered 2013-10-25 – 2013-10-26 (×3): 40 mg via ORAL
  Filled 2013-10-25 (×5): qty 1

## 2013-10-25 MED ORDER — WARFARIN SODIUM 5 MG PO TABS
5.0000 mg | ORAL_TABLET | Freq: Once | ORAL | Status: AC
  Administered 2013-10-25: 5 mg via ORAL
  Filled 2013-10-25: qty 1

## 2013-10-25 MED ORDER — LEVOFLOXACIN 500 MG PO TABS
500.0000 mg | ORAL_TABLET | Freq: Every day | ORAL | Status: DC
  Administered 2013-10-25 – 2013-10-26 (×2): 500 mg via ORAL
  Filled 2013-10-25 (×2): qty 1

## 2013-10-25 NOTE — Care Management Note (Addendum)
    Page 1 of 2   10/26/2013     10:45:59 AM   CARE MANAGEMENT NOTE 10/26/2013  Patient:  ANYLA, ISRAELSON   Account Number:  [id number]  Date Initiated:  10/23/2013  Documentation initiated by:  DAVIS,RHONDA  Subjective/Objective Assessment:   past medical history of hypertension, coronary artery disease, s/p CABG, atrial fibrillation on coumadin, COPD, chronic iron deficiency anemia, GERD, CKD stage III, and recent bone marrow biopsy for thrombcytopenia     Action/Plan:   home when stable   Anticipated DC Date:  10/26/2013   Anticipated DC Plan:  [place]  CM consult      Choice offered to / List presented to:  C-1 Patient        [place] arranged  HH-1 RN  [place]      [facility] agency  [facility]   Status of service:  Completed, signed off Medicare Important Message given?   (If response is "NO", the following Medicare IM given date fields will be blank) Date Medicare IM given:   Date Additional Medicare IM given:    Discharge Disposition:  [place]  Per UR Regulation:  Reviewed for med. necessity/level of care/duration of stay  If discussed at [employer] of Stay Meetings, dates discussed:    Comments:  10/26/13 Radiah Lubinski RN,BSN NCM [phone] D/C HOME W/HHRN-PT/INR [place] PCP.HHPT-EXERCISES.[facility] CAN ACCEPT, AWARE OF D/C, & HHRN/HHPT HAS ALL ORDERS NEEDED.  10/25/13 Charelle Petrakis NR,BSN NCM [phone] FAXED HHRN-COUMADIN CHECKS/PT ORDER TO [facility],AWAIT RESPONSE IF THEY CAN ACCEPT. TRANSFER FROM SDU.PT-HH.HAS USED [facility] IN PAST FOR HHPT,WILL USE AGAIN.TC [facility] THEY NEED TO CHECK IF THEY CAN TAKE PATIENT,WOULD NEED HHPT ORDER,SINCE ON COUMADIN ?HHRN NEEDED ALSO.AWAIT FINAL HHC ORDERS.IF HOME 02 NEEDED CAN ARRANGE IF QUALIFIES,& ORDERS PLACED.  [id number]/Rhonda Rosana Hoes, [place], [place], [place] [phone] Chart Reviewed for discharge and hospital needs. Discharge needs at  time of review: None present will follow for needs. Review of patient progress due on [id number].

## 2013-10-25 NOTE — Progress Notes (Signed)
[name] PROGRESS NOTE  [name] IPJ:[id number] DOB: 07/24/1940 DOA: 10/22/2013 PCP: Noralee Space, MD Brief Narrative: Patient is a 73 year old Caucasian female with history of CHF, CAD status post CABG, and COPD who presented to the hospital with altered mental status. Found to have sepsis in the ED and currently being treated for sepsis with broad-spectrum antibiotics presumed PNA as source.  Assessment/Plan:    Acute respiratory failure -improving -Multifactorial COPD exacerbation, pneumonia -wean off steroids, cultures negative -change to PO levaquin from Vanc/Piptazo  Anemia of chronic disease - Pt s/p 1 unit of PRBC - needs EPO per PCP or FU with Renal  Sepsis -due to pneumonia, Resolving -change Abx as noted above     COPD exacerbation -  Will continue Xopenex, Dulera, wean off Solu-Medrol, antibiotics - Supplemental oxygen,  wean to room air  HYPERLIPIDEMIA - Stable continue home regimen    Chronic pain syndrome - Stable, continue current regimen    GERD (gastroesophageal reflux disease) - Stable on Protonix   CKD (chronic kidney disease), stage III - Baseline at 1.3-1.4. Currently patient near baseline - Will continue to monitor serum creatinine    Atrial fibrillation with RVR - Resolved and initially was most likely exacerbated 2 to active infection -resolved, continue coumadin and BB    S/P CABG (coronary artery bypass graft) - Stable, continue aspirin    Other pancytopenia - Patient to followup with Dr.Mohamed for results of bone marrow biopsy   Code Status: full Family Communication: d/w spouse at bedside Disposition Plan: home tomorrow   Consultants:  None  Procedures:  None  Antibiotics:  Vanc and Pip/Tazo ->4/8  Levaquin 4/8  HPI/Subjective: Breathing better  Objective: Filed Vitals:   10/25/13 1300  BP: 92/65  Pulse: 89  Temp: 98.5 F (36.9 C)  Resp: 19    Intake/Output Summary (Last 24 hours) at  10/25/13 1527 Last data filed at 10/25/13 1331  Gross per 24 hour  Intake   1090 ml  Output      0 ml  Net   1090 ml   Filed Weights   10/23/13 0358 10/24/13 0630 10/25/13 0500  Weight: 94.257 kg (207 lb 12.8 oz) 95.89 kg (211 lb 6.4 oz) 93.35 kg (205 lb 12.8 oz)    Exam:   General:  Patient in no acute distress, alert and awake.   Cardiovascular: Irregular, no murmurs rubs  Respiratory: scattered ronchi at both bases  Abdomen: Soft, nondistended, nontender  Musculoskeletal: No cyanosis or clubbing  Data Reviewed: Basic Metabolic Panel:  Recent Labs Lab 10/22/13 0834 10/22/13 0844 10/23/13 0345 10/24/13 0139 10/25/13 0351  NA  --  132* 137 134* 140  K  --  4.3 4.3 4.1 4.2  CL  --  94* 99 96 99  CO2  --  _0 GLUCOSE  --  104* 133* 93 95  BUN  --  25* 29* 30* 30*  CREATININE  --  1.52* 1.60* 1.45* 1.50*  CALCIUM  --  8.8 8.2* 8.3* 8.4  MG 2.0  --   --   --   --   PHOS 2.7  --   --   --   --    Liver Function Tests:  Recent Labs Lab 10/22/13 0844  AST 23  ALT 10  ALKPHOS 93  BILITOT 0.9  PROT 7.8  ALBUMIN 3.3*   No results found for this basename: LIPASE, AMYLASE,  in the last 168 hours No results found for this basename:  AMMONIA,  in the last 168 hours CBC:  Recent Labs Lab 10/22/13 0844 10/23/13 0345 10/24/13 0139 10/25/13 0351  WBC 3.6* 4.4 3.5* 3.1*  HGB 8.1* 7.3* 8.6* 8.7*  HCT 25.8* 23.4* 27.0* 27.4*  MCV 103.6* 104.9* 99.6 99.3  PLT 153 123* 145* 116*   Cardiac Enzymes: No results found for this basename: CKTOTAL, CKMB, CKMBINDEX, TROPONINI,  in the last 168 hours BNP (last 3 results)  Recent Labs  09/03/13 1500 09/21/13 1125 10/22/13 0844  PROBNP 14053.0* 203.0* 5985.0*   CBG: No results found for this basename: GLUCAP,  in the last 168 hours  Recent Results (from the past 240 hour(s))  CULTURE, BLOOD (ROUTINE X 2)     Status: None   Collection Time    10/22/13  8:44 AM      Result Value Ref Range Status    Specimen Description BLOOD RIGHT ANTECUBITAL   Final   Special Requests BOTTLES DRAWN AEROBIC AND ANAEROBIC 5 CC EACH   Final   Culture  Setup Time     Final   Value: 10/22/2013 15:20     Performed at [facility]   Culture     Final   Value:        BLOOD CULTURE RECEIVED NO GROWTH TO DATE CULTURE WILL BE HELD FOR 5 DAYS BEFORE ISSUING A FINAL NEGATIVE REPORT     Performed at [facility]   Report Status PENDING   Incomplete  CULTURE, BLOOD (ROUTINE X 2)     Status: None   Collection Time    10/22/13  8:44 AM      Result Value Ref Range Status   Specimen Description BLOOD BLOOD LEFT FOREARM   Final   Special Requests BOTTLES DRAWN AEROBIC AND ANAEROBIC 4 CC [facility]   Final   Culture  Setup Time     Final   Value: 10/22/2013 15:20     Performed at [facility]   Culture     Final   Value:        BLOOD CULTURE RECEIVED NO GROWTH TO DATE CULTURE WILL BE HELD FOR 5 DAYS BEFORE ISSUING A FINAL NEGATIVE REPORT     Performed at [facility]   Report Status PENDING   Incomplete  URINE CULTURE     Status: None   Collection Time    10/22/13 11:41 AM      Result Value Ref Range Status   Specimen Description URINE, CLEAN CATCH   Final   Special Requests NONE   Final   Culture  Setup Time     Final   Value: 10/22/2013 19:23     Performed at [place]     Final   Value: NO GROWTH     Performed at [facility]   Culture     Final   Value: NO GROWTH     Performed at [facility]   Report Status 10/23/2013 FINAL   Final  MRSA PCR SCREENING     Status: Abnormal   Collection Time    10/22/13 11:43 AM      Result Value Ref Range Status   MRSA by PCR POSITIVE (*) NEGATIVE Final   Comment:            The GeneXpert MRSA Assay (FDA     approved for NASAL specimens     only), is one component of a     comprehensive MRSA colonization  surveillance program. It is not     intended to diagnose MRSA     infection nor to  guide or     monitor treatment for     MRSA infections.     RESULT CALLED TO, READ BACK BY AND VERIFIED WITH:     D.VAUDREUIL AT 1504 ON [id number] BY C.BONGEL  CULTURE, EXPECTORATED SPUTUM-ASSESSMENT     Status: None   Collection Time    10/23/13 11:10 AM      Result Value Ref Range Status   Specimen Description SPUTUM   Final   Special Requests NONE   Final   Sputum evaluation     Final   Value: THIS SPECIMEN IS ACCEPTABLE. RESPIRATORY CULTURE REPORT TO FOLLOW.   Report Status 10/23/2013 FINAL   Final  CULTURE, RESPIRATORY (NON-EXPECTORATED)     Status: None   Collection Time    10/23/13 11:10 AM      Result Value Ref Range Status   Specimen Description SPUTUM   Final   Special Requests NONE   Final   Gram Stain     Final   Value: NO WBC SEEN     NO SQUAMOUS EPITHELIAL CELLS SEEN     NO ORGANISMS SEEN     Performed at [facility]   Culture     Final   Value: NORMAL OROPHARYNGEAL FLORA     Performed at [facility]   Report Status 10/25/2013 FINAL   Final     Studies: No results found.  Scheduled Meds: . aspirin EC  81 mg Oral QPC supper  . Chlorhexidine Gluconate Cloth  6 each Topical Q0600  . docusate sodium  200 mg Oral QHS  . furosemide  40 mg Oral BID  . levofloxacin  500 mg Oral Daily  . metoprolol  50 mg Oral BID  . mometasone-formoterol  2 puff Inhalation BID  . mupirocin ointment  1 application Nasal BID  . nortriptyline  50 mg Oral QHS  . pantoprazole  40 mg Oral Daily  . simvastatin  20 mg Oral QHS  . warfarin  5 mg Oral ONCE-1800  . Warfarin - Pharmacist Dosing Inpatient   Does not apply q1800   Continuous Infusions:    Time spent: > 25 minutes    [facility] Pager [phone]. If 7PM-7AM, please contact night-coverage at [email or web address], password [facility] 10/25/2013, 3:27 PM  LOS: 3 days

## 2013-10-25 NOTE — Progress Notes (Addendum)
ANTICOAGULATION CONSULT NOTE - Follow up  Pharmacy Consult for warfarin Indication: atrial fibrillation  No Known Allergies  Patient Measurements: Height: 5' 7.5" (171.5 cm) Weight: 205 lb 12.8 oz (93.35 kg) IBW/kg (Calculated) : 62.75 Heparin Dosing Weight: 83 kg  Vital Signs: Temp: 97.8 F (36.6 C) (04/08 0500) Temp src: Oral (04/08 0500) BP: 134/69 mmHg (04/08 0500) Pulse Rate: 86 (04/08 0500)  Labs:  Recent Labs  10/23/13 0345 10/23/13 1155 10/24/13 0139 10/25/13 0351  HGB 7.3*  --  8.6* 8.7*  HCT 23.4*  --  27.0* 27.4*  PLT 123*  --  145* 116*  LABPROT 20.1*  --  22.6* 23.6*  INR 1.77*  --  2.06* 2.18*  HEPARINUNFRC 0.62 0.70 0.47  --   CREATININE 1.60*  --  1.45* 1.50*   Medications:  Scheduled:  . aspirin EC  81 mg Oral QPC supper  . Chlorhexidine Gluconate Cloth  6 each Topical Q0600  . docusate sodium  200 mg Oral QHS  . furosemide  40 mg Oral Daily  . methylPREDNISolone (SOLU-MEDROL) injection  60 mg Intravenous Daily  . metoprolol  12.5 mg Oral BID  . mometasone-formoterol  2 puff Inhalation BID  . mupirocin ointment  1 application Nasal BID  . nortriptyline  50 mg Oral QHS  . pantoprazole  40 mg Oral Daily  . piperacillin-tazobactam (ZOSYN)  IV  3.375 g Intravenous 3 times per day  . simvastatin  20 mg Oral QHS  . vancomycin  750 mg Intravenous Q12H  . Warfarin - Pharmacist Dosing Inpatient   Does not apply q1800   Assessment: 40 yoF presented to [facility] ED 4/5 with altered mental status and fever.  PMH includes Afib on chronic anticoagulation with warfarin.  PTA warfarin dosing is 56m daily except 2.56mon Mon, Wed, Friday.  Her last dose was 10/21/13 and her INR on admission is subtherapeutic (1.45).  Pharmacy initially consulted to dose IV heparin and warfarin,  heparin stopped 4/7 as INR >2 and pt needing blood transfusion. Hx of pancytopenia PTA, bone marrow biopsy 3/25.  INR 2.18 in therapeutic range. Warfarin from 4/5: 7.5, 5, 94m59mCBC: Hgb  improved after 1 unit PRBCs 4/6 - today 8.7. Plts 116 (145 yesterday)  No bleeding noted by RN  CKD-III - appears at baseline  Goal of Therapy:  INR 2-3   Plan:   Repeat Warfarin 5 PO today at 1800  Daily INR  Continue to monitor H&H and plts  GreMinda DittoarmD Pager [phone]/2015, 8:12 AM  Addendum: Levafloxacin added today, can increase PT/INR, likely see effect in ~ 3 days from start of abx.

## 2013-10-26 ENCOUNTER — Encounter (HOSPITAL_COMMUNITY): Payer: Self-pay

## 2013-10-26 LAB — BASIC METABOLIC PANEL
BUN: 24 mg/dL — AB (ref 6–23)
CALCIUM: 8.5 mg/dL (ref 8.4–10.5)
CHLORIDE: 96 meq/L (ref 96–112)
CO2: 26 mEq/L (ref 19–32)
Creatinine, Ser: 1.36 mg/dL — ABNORMAL HIGH (ref 0.50–1.10)
GFR calc non Af Amer: 38 mL/min — ABNORMAL LOW (ref >90)
GFR, EST AFRICAN AMERICAN: 44 mL/min — AB (ref >90)
Glucose, Bld: 88 mg/dL (ref 70–99)
Potassium: 3 mEq/L — ABNORMAL LOW (ref 3.7–5.3)
Sodium: 140 mEq/L (ref 137–147)

## 2013-10-26 LAB — CBC
HEMATOCRIT: 29.5 % — AB (ref 36.0–46.0)
Hemoglobin: 9.1 g/dL — ABNORMAL LOW (ref 12.0–15.0)
MCH: 30.6 pg (ref 26.0–34.0)
MCHC: 30.8 g/dL (ref 30.0–36.0)
MCV: 99.3 fL (ref 78.0–100.0)
Platelets: 148 10*3/uL — ABNORMAL LOW (ref 150–400)
RBC: 2.97 MIL/uL — ABNORMAL LOW (ref 3.87–5.11)
RDW: 22.3 % — AB (ref 11.5–15.5)
WBC: 3.2 10*3/uL — AB (ref 4.0–10.5)

## 2013-10-26 LAB — PROTIME-INR
INR: 2.55 — ABNORMAL HIGH (ref 0.00–1.49)
Prothrombin Time: 26.6 seconds — ABNORMAL HIGH (ref 11.6–15.2)

## 2013-10-26 MED ORDER — BENZONATATE 100 MG PO CAPS: 100.0000 mg | ORAL_CAPSULE | Freq: Three times a day (TID) | ORAL | Status: DC | PRN

## 2013-10-26 MED ORDER — PREDNISONE 20 MG PO TABS: 10.0000 mg | ORAL_TABLET | Freq: Every day | ORAL | Status: DC

## 2013-10-26 MED ORDER — LEVOFLOXACIN 500 MG PO TABS: 500.0000 mg | ORAL_TABLET | Freq: Every day | ORAL | Status: DC

## 2013-10-26 MED ORDER — DOCUSATE SODIUM 100 MG PO CAPS: 200.0000 mg | ORAL_CAPSULE | Freq: Every day | ORAL | Status: DC | PRN

## 2013-10-26 MED ORDER — POTASSIUM CHLORIDE CRYS ER 20 MEQ PO TBCR
40.0000 meq | EXTENDED_RELEASE_TABLET | Freq: Once | ORAL | Status: AC
  Administered 2013-10-26: 40 meq via ORAL
  Filled 2013-10-26: qty 2

## 2013-10-26 MED ORDER — WARFARIN SODIUM 2.5 MG PO TABS
2.5000 mg | ORAL_TABLET | Freq: Once | ORAL | Status: DC
  Filled 2013-10-26: qty 1

## 2013-10-26 MED ORDER — PREDNISONE 50 MG PO TABS
50.0000 mg | ORAL_TABLET | Freq: Every day | ORAL | Status: DC
  Administered 2013-10-26: 50 mg via ORAL
  Filled 2013-10-26 (×2): qty 1

## 2013-10-26 MED ORDER — GABAPENTIN 600 MG PO TABS: 300.0000 mg | ORAL_TABLET | Freq: Three times a day (TID) | ORAL | Status: DC

## 2013-10-26 NOTE — Progress Notes (Signed)
Assessment unchanged continue with plan of care. Pt resting without distress noted.SRP,RN  

## 2013-10-26 NOTE — Progress Notes (Signed)
ANTICOAGULATION CONSULT NOTE  Pharmacy Consult for warfarin Indication: atrial fibrillation  No Known Allergies  Patient Measurements: Height: 5' 7.5" (171.5 cm) Weight: 202 lb 1.6 oz (91.672 kg) IBW/kg (Calculated) : 62.75 Heparin Dosing Weight: 83 kg  Vital Signs: Temp: 99 F (37.2 C) (04/09 0600) Temp src: Oral (04/09 0600) BP: 117/89 mmHg (04/09 0600) Pulse Rate: 108 (04/09 0600)  Labs:  Recent Labs  10/23/13 1155  10/24/13 0139 10/25/13 0351 10/26/13 0415  HGB  --   < > 8.6* 8.7* 9.1*  HCT  --   --  27.0* 27.4* 29.5*  PLT  --   --  145* 116* 148*  LABPROT  --   --  22.6* 23.6* 26.6*  INR  --   --  2.06* 2.18* 2.55*  HEPARINUNFRC 0.70  --  0.47  --   --   CREATININE  --   --  1.45* 1.50* 1.36*  < > = values in this interval not displayed. Medications:  Scheduled:  . aspirin EC  81 mg Oral QPC supper  . Chlorhexidine Gluconate Cloth  6 each Topical Q0600  . docusate sodium  200 mg Oral QHS  . furosemide  40 mg Oral BID  . levofloxacin  500 mg Oral Daily  . metoprolol  50 mg Oral BID  . mometasone-formoterol  2 puff Inhalation BID  . mupirocin ointment  1 application Nasal BID  . nortriptyline  50 mg Oral QHS  . pantoprazole  40 mg Oral Daily  . potassium chloride  40 mEq Oral Once  . simvastatin  20 mg Oral QHS  . Warfarin - Pharmacist Dosing Inpatient   Does not apply q1800   Assessment: 89 yoF presented to [facility] ED 4/5 with altered mental status and fever.  PMH includes Afib on chronic anticoagulation with warfarin.  PTA warfarin dosing is $RemoveB'5mg'JnAPOeSG$  daily except 2.$RemoveBefore'5mg'JaWIFujWkhDke$  on Mon, Wed, Friday.  Her last dose PTA was 10/21/13 and her INR on admission was subtherapeutic (1.45).  Pharmacy was initially consulted to dose IV heparin and warfarin.  Heparin was stopped 4/7 as INR >2 and pt needing blood transfusion. Hx of pancytopenia PTA, bone marrow biopsy 3/25 with patient to follow-up with Dr. Julien Nordmann.  4/9:  Inpatient warfarin doses from 4/5 - 4/8: 7.5 mg, 5 mg, 5 mg,  $Re'5mg'GNq$   INR 2.55 - therapeutic today  Concomitant levofloxacin noted (started 4/8) - can increase INR response  Concomitant low-dose ASA noted (s/p CABG)  Hgb stable, Pltc improved.   RN report from 4/7 noted small hematoma on L mid back.  Goal of Therapy:  INR 2-3  Plan: 1. Warfarin 2.5 mg PO x 1 today at 6pm. 2. PT / INR daily while inpatient 3. Follow H/H , pltc, and any reports of bleeding.  Clayburn Pert, PharmD, BCPS Pager: [phone] 10/26/2013  8:41 AM

## 2013-10-26 NOTE — Progress Notes (Signed)
Discharged patient and went over all instructions.  Also she has been scheduled for an appointment with Dr. Lenna Gilford on Monday April 20th at 4pm.  Patient did not have any questions and was anxious to go home.

## 2013-10-27 ENCOUNTER — Telehealth: Payer: Self-pay | Admitting: Pulmonary Disease

## 2013-10-27 ENCOUNTER — Other Ambulatory Visit: Payer: Self-pay | Admitting: *Deleted

## 2013-10-27 ENCOUNTER — Telehealth: Payer: Self-pay | Admitting: Internal Medicine

## 2013-10-27 LAB — TYPE AND SCREEN
ABO/RH(D): O NEG
Antibody Screen: NEGATIVE
Donor AG Type: NEGATIVE
Donor AG Type: NEGATIVE
Unit division: 0
Unit division: 0

## 2013-10-27 MED ORDER — CLOTRIMAZOLE 10 MG MT TROC: 10.0000 mg | Freq: Every day | OROMUCOSAL | Status: DC

## 2013-10-27 NOTE — Telephone Encounter (Signed)
pt husband call to r/s appt...done...he aware of new appt

## 2013-10-27 NOTE — Telephone Encounter (Signed)
Spoke with pt. She is currently on ABX giving to her from the hospital. She reports she feel like she has "thrush". Mouth is sore and feels like cotton in her mouth. Pt wants something called in. Please advise TP tahnks  No Known Allergies

## 2013-10-27 NOTE — Telephone Encounter (Signed)
Per TP: mycelex troche #35 5 times daily x 7 day  Pt aware of recs. rx called in.

## 2013-10-28 LAB — CULTURE, BLOOD (ROUTINE X 2)
CULTURE: NO GROWTH
Culture: NO GROWTH

## 2013-10-30 ENCOUNTER — Ambulatory Visit (INDEPENDENT_AMBULATORY_CARE_PROVIDER_SITE_OTHER): Payer: Medicare Other | Admitting: Pharmacist Clinician (PhC)/ Clinical Pharmacy Specialist

## 2013-10-30 DIAGNOSIS — Z7901 Long term (current) use of anticoagulants: Secondary | ICD-10-CM

## 2013-10-30 DIAGNOSIS — I4891 Unspecified atrial fibrillation: Secondary | ICD-10-CM

## 2013-10-30 LAB — POCT INR: INR: 4.5

## 2013-11-02 ENCOUNTER — Other Ambulatory Visit (HOSPITAL_BASED_OUTPATIENT_CLINIC_OR_DEPARTMENT_OTHER): Payer: Medicare Other

## 2013-11-02 ENCOUNTER — Ambulatory Visit (HOSPITAL_BASED_OUTPATIENT_CLINIC_OR_DEPARTMENT_OTHER): Payer: Medicare Other | Admitting: Internal Medicine

## 2013-11-02 ENCOUNTER — Encounter: Payer: Self-pay | Admitting: Internal Medicine

## 2013-11-02 ENCOUNTER — Telehealth: Payer: Self-pay | Admitting: Internal Medicine

## 2013-11-02 VITALS — BP 118/90 | HR 84 | Temp 97.4°F | Resp 17 | Ht 67.5 in | Wt 195.8 lb

## 2013-11-02 DIAGNOSIS — D46C Myelodysplastic syndrome with isolated del(5q) chromosomal abnormality: Secondary | ICD-10-CM

## 2013-11-02 DIAGNOSIS — D539 Nutritional anemia, unspecified: Secondary | ICD-10-CM

## 2013-11-02 DIAGNOSIS — E8809 Other disorders of plasma-protein metabolism, not elsewhere classified: Secondary | ICD-10-CM | POA: Insufficient documentation

## 2013-11-02 DIAGNOSIS — D61818 Other pancytopenia: Secondary | ICD-10-CM

## 2013-11-02 LAB — CBC WITH DIFFERENTIAL/PLATELET
BASO%: 2.4 % — ABNORMAL HIGH (ref 0.0–2.0)
Basophils Absolute: 0.1 10*3/uL (ref 0.0–0.1)
EOS%: 0.4 % (ref 0.0–7.0)
Eosinophils Absolute: 0 10*3/uL (ref 0.0–0.5)
HEMATOCRIT: 34.4 % — AB (ref 34.8–46.6)
HEMOGLOBIN: 10.8 g/dL — AB (ref 11.6–15.9)
LYMPH%: 41.1 % (ref 14.0–49.7)
MCH: 31 pg (ref 25.1–34.0)
MCHC: 31.4 g/dL — ABNORMAL LOW (ref 31.5–36.0)
MCV: 98.9 fL (ref 79.5–101.0)
MONO#: 1 10*3/uL — ABNORMAL HIGH (ref 0.1–0.9)
MONO%: 17.7 % — AB (ref 0.0–14.0)
NEUT#: 2.1 10*3/uL (ref 1.5–6.5)
NEUT%: 38.4 % (ref 38.4–76.8)
Platelets: 137 10*3/uL — ABNORMAL LOW (ref 145–400)
RBC: 3.48 10*6/uL — ABNORMAL LOW (ref 3.70–5.45)
RDW: 21.7 % — ABNORMAL HIGH (ref 11.2–14.5)
WBC: 5.4 10*3/uL (ref 3.9–10.3)
lymph#: 2.2 10*3/uL (ref 0.9–3.3)
nRBC: 1 % — ABNORMAL HIGH (ref 0–0)

## 2013-11-02 LAB — LACTATE DEHYDROGENASE (CC13): LDH: 301 U/L — ABNORMAL HIGH (ref 125–245)

## 2013-11-02 LAB — TECHNOLOGIST REVIEW: Technologist Review: 2

## 2013-11-02 NOTE — Telephone Encounter (Signed)
Gave pt appt for lab,md and chemo class for April 2015

## 2013-11-02 NOTE — Progress Notes (Signed)
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 2) plasma cell dyscrasia.  PRIOR THERAPY: None  CURRENT THERAPY: Revlimid 10 mg by mouth daily for 21 days every 4 weeks.  INTERVAL HISTORY: [name] 73 y.o. female returns to the clinic today for follow up visit accompanied by her husband. The patient is feeling fine today except for increasing fatigue and weakness. She was recently admitted to [facility] on 10/22/2013 with acute respiratory failure and questionable community acquired pneumonia. She was treated with vancomycin and Zosyn during her hospitalization in addition to Solu-Medrol. She also received one to PRBCs transfusion during her admission. The patient is feeling much better today but continues to complain of increasing fatigue. She denied having any significant fever or chills, no nausea or vomiting, no weight loss or night sweats. She recently underwent a bone marrow biopsy and aspirate for evaluation of her persistent anemia and she is here today for evaluation and discussion of her lab and biopsy results as well as recommendation regarding treatment of her condition.  MEDICAL HISTORY: Past Medical History  Diagnosis Date  . Coronary atherosclerosis of unspecified type of vessel, native or graft   . Atrial fibrillation 01/19/2012    stress test - non-gated secondary to A fib,  normal myocaridal perfusion study  . Unspecified venous (peripheral) insufficiency   . Other and unspecified hyperlipidemia   . Morbid obesity   . Esophageal reflux   . Irritable bowel syndrome   . Other symptoms involving urinary system(788.99)   . Chronic pain syndrome   . 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 Anxiety state, unspecified    . Herpes zoster without mention of complication   . Herpes zoster with other nervous system complications(053.19)   . Lumbago   . Anticoagulated on Coumadin, chronic for A. Fib 04/08/2012  . COPD (chronic obstructive pulmonary disease)   . 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 Pneumonia 4 years ago  . Bronchitis, chronic   . Fibromyalgia   . Arthritis   . CAD (coronary artery disease) 04/29/2006    echo -EF 45-50%, impaired LV relaxation  . 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 GI bleed 04/07/2012    endoscopy/colonoscopy unremarkable  . 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 DJD (degenerative joint disease)   . Myocardial infarction   . Anemia     ALLERGIES:  has No Known Allergies.  MEDICATIONS:  Current Outpatient Prescriptions  Medication Sig Dispense Refill  . albuterol (PROVENTIL) (2.5 MG/3ML) 0.083% nebulizer solution Take 3 mLs (2.5 mg total) by nebulization 4 (four) times daily as needed for wheezing or shortness of breath.  360 mL  4  . ALPRAZolam (XANAX) 0.5 MG tablet Take 0.25-0.5 mg by mouth 4 (four) times daily as needed for anxiety.      Marland Kitchen aspirin EC 81 MG tablet Take 81 mg by mouth daily after supper.       . benzonatate (TESSALON) 100 MG capsule Take 1 capsule (100 mg total) by mouth 3 (three) times daily as needed for cough.  20 capsule  0  . Calcium Carbonate-Vitamin D (CALCIUM-VITAMIN D) 500-200 MG-UNIT per tablet Take 1 tablet by mouth 2 (  two) times daily with a meal.      . clotrimazole (MYCELEX) 10 MG troche Take 1 tablet (10 mg total) by mouth 5 (five) times daily. X 7 days  35 tablet  0  . docusate sodium (COLACE) 100 MG capsule Take 2 capsules (200 mg total) by mouth daily as needed for mild constipation.    0  . ferrous sulfate 325 (65 FE) MG tablet Take 325 mg by mouth every evening.      . Fluticasone-Salmeterol (ADVAIR DISKUS) 250-50 MCG/DOSE AEPB Inhale 1 puff into the lungs 2 (two)  times daily.  60 each  5  . furosemide (LASIX) 40 MG tablet Take 40 mg by mouth 2 (two) times daily.       Marland Kitchen gabapentin (NEURONTIN) 600 MG tablet Take 0.5 tablets (300 mg total) by mouth 3 (three) times daily.      Marland Kitchen guaiFENesin (MUCINEX) 600 MG 12 hr tablet Take 1,200 mg by mouth 2 (two) times daily.      . isosorbide mononitrate (IMDUR) 30 MG 24 hr tablet Take 1 tablet (30 mg total) by mouth at bedtime.  30 tablet  11  . metoprolol (LOPRESSOR) 50 MG tablet Take 1 tablet (50 mg total) by mouth 2 (two) times daily.  60 tablet  0  . Multiple Vitamin (MULTIVITAMIN WITH MINERALS) TABS Take 1 tablet by mouth every morning.       . nitroGLYCERIN (NITROSTAT) 0.4 MG SL tablet Place 0.4 mg under the tongue every 5 (five) minutes as needed for chest pain.      . nortriptyline (PAMELOR) 50 MG capsule Take 1 capsule (50 mg total) by mouth at bedtime.  30 capsule  6  . omeprazole (PRILOSEC) 20 MG capsule Take 20 mg by mouth 2 (two) times daily.      . ondansetron (ZOFRAN) 4 MG tablet Take 4 mg by mouth every 8 (eight) hours as needed for nausea or vomiting.      Marland Kitchen oxyCODONE-acetaminophen (PERCOCET/ROXICET) 5-325 MG per tablet Take 2 tablets by mouth every 4 (four) hours as needed for pain.      . potassium chloride SA (K-DUR,KLOR-CON) 20 MEQ tablet Take 20 mEq by mouth 2 (two) times daily.      . simvastatin (ZOCOR) 20 MG tablet Take 20 mg by mouth at bedtime.      . SYMBICORT 160-4.5 MCG/ACT inhaler       . tiZANidine (ZANAFLEX) 4 MG tablet       . warfarin (COUMADIN) 5 MG tablet Take 2.5-5 mg by mouth every morning. She takes half a tablet on Monday, Wednesday, Friday and one tablet on Tuesday, Thursday, Saturday and Sunday.       No current facility-administered medications for this visit.    SURGICAL HISTORY:  Past Surgical History  Procedure Laterality Date  . Vesicovaginal fistula closure w/ tah    . Cardiac catheterization  01/06/2010    RCA mid artery stenosis at 99%, LAD proximal occlusion 90%   at origin of ramus  . Nissen fundoplication    . Esophagogastroduodenoscopy  04/09/2012    Procedure: ESOPHAGOGASTRODUODENOSCOPY (EGD);  Surgeon: Beryle Beams, MD;  Location: [facility] ENDOSCOPY;  Service: Endoscopy;  Laterality: N/A;  tentatively scheduled per Trula Slade due to patients breathing problems  . Colonoscopy  04/11/2012    Procedure: COLONOSCOPY;  Surgeon: Ladene Artist, MD,FACG;  Location: [facility] ENDOSCOPY;  Service: Endoscopy;  Laterality: N/A;  . Abdominal hysterectomy  at 73 years old  . Appendectomy  about 50 years ago  . Coronary artery bypass graft  1997    LIMA to LAD, SVG to ramus, SVG to RCA  . Cholecystectomy  50 years ago  . Neck surgery  73 years old    full movement  . Right knee arthroscopy  3 years ago  . Eye surgery  2005    cataracts both eyes  . Back surgery      x5, "birdcage" and fused in lower back  . Total knee arthroplasty  06/28/2012    Procedure: TOTAL KNEE ARTHROPLASTY;  Surgeon: Sydnee Cabal, MD;  Location: [facility] ORS;  Service: Orthopedics;  Laterality: Left;  . Joint replacement Left 06/28/12    knee replacement    REVIEW OF SYSTEMS:  Constitutional: positive for fatigue Eyes: negative Ears, nose, mouth, throat, and face: negative Respiratory: negative Cardiovascular: negative Gastrointestinal: negative Genitourinary:negative Integument/breast: negative Hematologic/lymphatic: negative Musculoskeletal:negative Neurological: negative Behavioral/Psych: negative Endocrine: negative Allergic/Immunologic: negative   PHYSICAL EXAMINATION: General appearance: alert, cooperative, fatigued and no distress Head: Normocephalic, without obvious abnormality, atraumatic Neck: no adenopathy, no JVD, supple, symmetrical, trachea midline and thyroid not enlarged, symmetric, no tenderness/mass/nodules Lymph nodes: Cervical, supraclavicular, and axillary nodes normal. Resp: clear to auscultation bilaterally Back: symmetric, no curvature. ROM normal. No CVA  tenderness. Cardio: regular rate and rhythm, S1, S2 normal, no murmur, click, rub or gallop GI: soft, non-tender; bowel sounds normal; no masses,  no organomegaly Extremities: extremities normal, atraumatic, no cyanosis or edema Neurologic: Alert and oriented X 3, normal strength and tone. Normal symmetric reflexes. Normal coordination and gait  ECOG PERFORMANCE STATUS: 1 - Symptomatic but completely ambulatory  Blood pressure 118/90, pulse 84, temperature 97.4 F (36.3 C), temperature source Oral, resp. rate 17, height 5' 7.5" (1.715 m), weight 195 lb 12.8 oz (88.814 kg), SpO2 93.00%.  LABORATORY DATA: Lab Results  Component Value Date   WBC 5.4 11/02/2013   HGB 10.8* 11/02/2013   HCT 34.4* 11/02/2013   MCV 98.9 11/02/2013   PLT 137* 11/02/2013      Chemistry      Component Value Date/Time   NA 140 10/26/2013 0415   NA 138 10/02/2013 1330   K 3.0* 10/26/2013 0415   K 3.8 10/02/2013 1330   CL 96 10/26/2013 0415   CO2 26 10/26/2013 0415   CO2 24 10/02/2013 1330   BUN 24* 10/26/2013 0415   BUN 15.0 10/02/2013 1330   CREATININE 1.36* 10/26/2013 0415   CREATININE 1.4* 10/02/2013 1330      Component Value Date/Time   CALCIUM 8.5 10/26/2013 0415   CALCIUM 9.7 10/02/2013 1330   ALKPHOS 93 10/22/2013 0844   ALKPHOS 86 10/02/2013 1330   AST 23 10/22/2013 0844   AST 20 10/02/2013 1330   ALT 10 10/22/2013 0844   ALT 12 10/02/2013 1330   BILITOT 0.9 10/22/2013 0844   BILITOT 1.02 10/02/2013 1330       RADIOGRAPHIC STUDIES: Ct Biopsy  10/11/2013   CLINICAL DATA:  Pancytopenia  EXAM: CT-GUIDED BONE MARROW ASPIRATE AND CORE.  MEDICATIONS AND MEDICAL HISTORY: Versed four mg, Fentanyl 100 mcg.  Additional Medications: none.  ANESTHESIA/SEDATION: Moderate sedation time: 11 minutes  PROCEDURE: The procedure, risks, benefits, and alternatives were explained to the patient. Questions regarding the procedure were encouraged and answered. The patient understands and consents to the procedure.  The back was prepped with  Betadine in a sterile fashion, and a sterile drape was applied covering the operative field. A sterile gown and sterile gloves were used for the  procedure.  Under CT guidance, an 11 gauge needle was inserted into the right iliac bone via posterior approach. An aspirate was obtained. Core was obtained. Final imaging was performed.  Patient tolerated the procedure well without complication. Vital sign monitoring by nursing staff during the procedure will continue as patient is in the special procedures unit for post procedure observation.  FINDINGS: The images document guide needle placement within the right iliac bone. Post biopsy images demonstrate no evidence hemorrhage.  IMPRESSION: Successful CT-guided bone marrow aspirate and core.   Electronically Signed   By: Maryclare Bean M.D.   On: 10/11/2013 16:43   Dg Chest Port 1 View  (if Code Sepsis Called)  10/22/2013   CLINICAL DATA:  Shortness of breath, fever, altered mental status  EXAM: PORTABLE CHEST - 1 VIEW  COMPARISON:  09/06/2013, 08/31/2013  FINDINGS: Mild background emphysema noted. Prior coronary bypass changes. Heart is enlarged with central vascular congestion. No effusion or pneumothorax. Increased patchy airspace opacity in the left hilar region and also the left lower lobe retrocardiac area. This could represent pneumonia. Trachea is midline. Atherosclerosis of the aorta. Artifact overlies the left apex.  IMPRESSION: Cardiomegaly with vascular congestion  Patchy left perihilar and retrocardiac left lower lobe airspace process concerning for pneumonia.   Electronically Signed   By: Daryll Brod M.D.   On: 10/22/2013 08:47   BONE MARROW REPORT FINAL DIAGNOSIS Diagnosis Bone Marrow, Aspirate,Biopsy, and Clot, right iliac - REFRACTORY ANEMIA WITH EXCESS BLASTS (RAEB-2). - PLASMA CELL DYSCRASIA. - SEE COMMENT PERIPHERAL BLOOD: - PANCYTOPENIA ASSOCIATED WITH ASSOCIATED CIRCULATING BLASTS. Diagnosis Note The bone marrow is hypercellular for age  with dyspoietic changes involving all myeloid cell lines. This is associated with increase in myeloblastic cells (16%) as demonstrated by morphology, immunoperoxidase stains and flow cytometric studies . The findings are consistent with refractory anemia with excess blasts (RAEB-2). Correlation with cytogenetic studies is recommended. The bone marrow also shows slight plasmacytosis representing 7% of all cells associated with some atypical cytomorphologic features. Immunohistochemical stains show lambda light chain restriction in plasma cells. The findings are consistent with a component of plasma cell dyscrasia/neoplasm. Correlation with serum protein electrophoresis is recommended. (BNS:ecj 10/13/2013) Susanne Greenhouse MD Pathologist, Electronic Signature (Case signed 10/13/2013)  ASSESSMENT AND PLAN: this is a very pleasant 73 years old white female recently diagnosed with: 1) refractory anemia with excess blasts, myelodysplastic syndrome with 5 q deletion. 2) plasma cell dyscrasia I have a lengthy discussion with the patient and her husband today about her current disease status and treatment options. In the presence of myelodysplastic syndrome with 5q deletion, I recommended for the patient treatment with Revlimid 10 mg by mouth daily for 21 days every 4 weeks. This treatment hopefully will decrease requirement for PRBCs transfusion from her myelodysplastic syndrome but is also a reasonable option for treatment of her plasma cell dyscrasia except that the dose would have been a little bit higher. I discussed with the patient adverse effect of Revlimid including but not limited to myelosuppression, nausea and vomiting, peripheral neuropathy, mild alopecia as well as teratogenic effects. The patient would like to proceed with the treatment as planned. I will arrange for the patient to have a chemotherapy education class before starting the first dose of her treatment. She would come back for follow  up visit in 2 weeks for evaluation and management any adverse effect of her treatment. She was advised to call immediately if she has any concerning symptoms in the interval.  The patient  voices understanding of current disease status and treatment options and is in agreement with the current care plan.  All questions were answered. The patient knows to call the clinic with any problems, questions or concerns. We can certainly see the patient much sooner if necessary.  I spent 20 minutes counseling the patient face to face. The total time spent in the appointment was 30 minutes.  Disclaimer: This note was dictated with voice recognition software. Similar sounding words can inadvertently be transcribed and may not be corrected upon review.

## 2013-11-03 ENCOUNTER — Other Ambulatory Visit: Payer: Medicare Other

## 2013-11-03 ENCOUNTER — Other Ambulatory Visit: Payer: Self-pay | Admitting: Medical Oncology

## 2013-11-03 ENCOUNTER — Ambulatory Visit (INDEPENDENT_AMBULATORY_CARE_PROVIDER_SITE_OTHER): Payer: Medicare Other | Admitting: Pharmacist Clinician (PhC)/ Clinical Pharmacy Specialist

## 2013-11-03 DIAGNOSIS — D469 Myelodysplastic syndrome, unspecified: Secondary | ICD-10-CM

## 2013-11-03 LAB — POCT INR: INR: 2.3

## 2013-11-03 MED ORDER — LENALIDOMIDE 10 MG PO CAPS: 10.0000 mg | ORAL_CAPSULE | Freq: Every day | ORAL | Status: DC

## 2013-11-03 NOTE — Addendum Note (Signed)
Addended by: Ardeen Garland on: 11/03/2013 12:27 PM   Modules accepted: Orders

## 2013-11-03 NOTE — Telephone Encounter (Signed)
revlimid sent to Dr Julien Nordmann for Josem Kaufmann and escribe with auth number.

## 2013-11-03 NOTE — Telephone Encounter (Signed)
[facility] cannot fill revlimid . Will send to [facility] after Dr [place] Blas.

## 2013-11-06 ENCOUNTER — Ambulatory Visit (INDEPENDENT_AMBULATORY_CARE_PROVIDER_SITE_OTHER)
Admission: RE | Admit: 2013-11-06 | Discharge: 2013-11-06 | Disposition: A | Discharge status: Home / Self Care | Payer: Medicare Other | Source: Ambulatory Visit | Attending: Pulmonary Disease | Admitting: Pulmonary Disease

## 2013-11-06 ENCOUNTER — Telehealth: Payer: Self-pay | Admitting: *Deleted

## 2013-11-06 ENCOUNTER — Ambulatory Visit (INDEPENDENT_AMBULATORY_CARE_PROVIDER_SITE_OTHER): Payer: Medicare Other | Admitting: Pulmonary Disease

## 2013-11-06 ENCOUNTER — Other Ambulatory Visit: Payer: Medicare Other

## 2013-11-06 ENCOUNTER — Encounter: Payer: Self-pay | Admitting: Pulmonary Disease

## 2013-11-06 ENCOUNTER — Inpatient Hospital Stay: Payer: Medicare Other | Admitting: Pulmonary Disease

## 2013-11-06 VITALS — BP 126/78 | HR 117 | Temp 98.3°F | Ht 67.0 in | Wt 201.0 lb

## 2013-11-06 DIAGNOSIS — G894 Chronic pain syndrome: Secondary | ICD-10-CM

## 2013-11-06 DIAGNOSIS — M545 Low back pain, unspecified: Secondary | ICD-10-CM

## 2013-11-06 DIAGNOSIS — I4891 Unspecified atrial fibrillation: Secondary | ICD-10-CM

## 2013-11-06 DIAGNOSIS — N183 Chronic kidney disease, stage 3 unspecified: Secondary | ICD-10-CM

## 2013-11-06 DIAGNOSIS — M1990 Unspecified osteoarthritis, unspecified site: Secondary | ICD-10-CM

## 2013-11-06 DIAGNOSIS — Z951 Presence of aortocoronary bypass graft: Secondary | ICD-10-CM

## 2013-11-06 DIAGNOSIS — J449 Chronic obstructive pulmonary disease, unspecified: Secondary | ICD-10-CM

## 2013-11-06 DIAGNOSIS — K219 Gastro-esophageal reflux disease without esophagitis: Secondary | ICD-10-CM

## 2013-11-06 DIAGNOSIS — J189 Pneumonia, unspecified organism: Secondary | ICD-10-CM

## 2013-11-06 DIAGNOSIS — D46C Myelodysplastic syndrome with isolated del(5q) chromosomal abnormality: Secondary | ICD-10-CM

## 2013-11-06 DIAGNOSIS — I679 Cerebrovascular disease, unspecified: Secondary | ICD-10-CM

## 2013-11-06 DIAGNOSIS — E785 Hyperlipidemia, unspecified: Secondary | ICD-10-CM

## 2013-11-06 DIAGNOSIS — I872 Venous insufficiency (chronic) (peripheral): Secondary | ICD-10-CM

## 2013-11-06 DIAGNOSIS — I2510 Atherosclerotic heart disease of native coronary artery without angina pectoris: Secondary | ICD-10-CM

## 2013-11-06 DIAGNOSIS — E8809 Other disorders of plasma-protein metabolism, not elsewhere classified: Secondary | ICD-10-CM

## 2013-11-06 NOTE — Telephone Encounter (Signed)
Prior auth required for revlimid.  Office notes/ lab work faxed to [employer] at [employer] [phone].  SLJ

## 2013-11-06 NOTE — Patient Instructions (Signed)
Today we updated your med list in our EPIC system...    Continue your current medications the same...    Restart the NEBULIZER treatments three times daily...  Use the incentive spirometer to aid in good lung expansion...  Today we did a fiollow up CXR...    We will contact you w/ the results when available...   Call for any questions...  Let's plan a follow up visit in 92mo, sooner if needed for problems.Marland KitchenMarland Kitchen

## 2013-11-06 NOTE — Progress Notes (Addendum)
Subjective:    Patient ID: [name], female    DOB: 05-28-1941, 73 y.o.   MRN: [id number]  HPI 73 y/o WF here for a follow up visit... she has ASHD w/ prev CABG and chr AFib on Coumadin followed by DrBerry... she also has FM/ chr pain syndrome followed by [name]... we have followed her primarily for COPD/ Asthmatic Bronchitis- see below...  ~  May 24, 2012:  11mo ROV & [name] was [facility] by [facility] 9/19 - 04/13/12 for weakness, dizzy, syncope in the setting of hypotension & acute anemia w/ GIbleed while on Coumadin> he specific numbers were BP=90/40, Hg=7.3, stools pos for blood, INR=2.19;  She received 2u blood, FFP, VitK;  She had EGD= neg (intact Nissen) & Colonoscopy= neg, no bleeding source found;  Coumadin was help then restarted;  Only med change was incr Metoprolol 25mg  tabs- take 3tabs Bid...  Since disch she saw TP 10/13> doing satis & Hg= 11.3;  She had f/u [facility] 10/13> stable chr AFib w/ controlled VR, same meds, no changes made...    COPD> on AlbutNEBS, Symbicort160, Mucinex; breating is stable & chest clear at present, RR=18, O2sat=94% on RA...    Cards> CAD, s/p CABG, chr AFib> on Imdur, Metoprolol, Lasix, KCl, Coumadin; [facility]...    Chol> on Simva20; last FLP at goals- continue same, get on diet, get wt down...    Obesity> she has gone 255 ==> 236 ==> 245 today; we reviewed diet, exercise, wt reduction strategies...    Renal Insuffic> prev labs in the 1.5 to 1.7 range, sl better during her Hosp reading 1.2 to 1.3, but now back to 1.6=>1.8 on her same outpt meds...    Rheum> DJD, Compression T4 & L2, chr pain syndrome, FM> she is followed by [name], DrTooke, [name]; on Percocet Tid Prn & Neurontin 600mg Bid;  Also takes Amitriptyline 50mg - 2hs & Flexeril 10 Tid Prn...    Anxiety> on Alprazolam 0.5mg  Tid as needed... We reviewed prob list, meds, xrays and labs> see below for updates >> she had Flu vaccine 9/13...  LABS 11/13:   Chems-  Ok x mild renal insuffic w/ BUN=25, Creat=1.8;  CBC- mild anemia w/ Hg=10.7, Fe=88 (21%)...  ~  October 24, 2012:  72mo ROV & [name] was [facility] 12/13 for Left TKR by Hillery Aldo & she did well w/ Lovenox bridging & no medical complications...  She went to the ER 4/14 w/ syncopal spell at home when she got up to go to the bathroom hitting her head & right arm;  ER eval was neg XRays were neg, and CTHead showed mild diffuse atrophy & vol loss w/ microvasc ischemic dis, no acute intracranial process;  Labs revealed Hg=10.8, Cr=1.66, Protime=27.3/ INR=2.69; Urine cloudy w/ WBCs (given Rocephin=>Keflex)...  We discussed checking MRI Brain and CDopplers;  She also needs f/u w/ Cards to r/o cardiac arrhythmia etc...     We reviewed prob list, meds, xrays and labs> see below for updates >> she reports that her insurance co requires change of Flexeril10 to Zanaflex4mg , and Phenergan25 to Zofran4mg ...  LABS 3-4/14:  FLP- looks good on Simva20;  Chems- ok x Creat=1.66;  CBC- Hg~11, MCV=91, & Fe=62 (17%);  TSH=2.88...  CTHead 4/14 showed mild diffuse atrophy & vol loss w/ microvasc ischemic dis, no acute intracranial process...  MRI Brain & CDopplers have been ordered & pending...  Cardiac f/u appt w/ DrBerry...  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 She remains on ASA81 + Coumadin via [facility];  Meds were adjusted- see below;  She had recent f/u [facility], DrBerry> s/p CABG 1997 w/ patent grafts on cath 2011 and non-ischemic myoview 7/13;  AFib rate controlled on Metop & anticoag on coumadin; BP controlled on meds w/o postural drop...     She had a bronchitic exac 6/14 & we called in Levaquin w/ resolution of her symptoms & back to baseline now...     Her CC= hurts all over w/ pain in knees esp; she is followed by.DrBeekman for Rheum on Oxycodone which he writes; she notes that she has Paget's in a LE bone & DrB wants to Rx w/ "shots" but she has declined & recalls DrRowe's prev advise; we do not have notes from Rheum to review...  We reviewed prob list, meds, xrays and labs> see below for updates >>   ~  May 24, 2013:  72mo ROV & [name] presents w/ LLQ pain & sl tenderness x several weeks now- she denies n/v, but is sl constip going 1-2 per wk and assoc w/ fair appetite and 29# weight loss to 218# today; GI hx includes GERD on Prilosec 20bid & she had EGD 9/13 by DrHung w/ intact Nissen & normal esoph/ gastric/ duod;  Her last colon was 9/13 by DrStark & also normal w/o divertics, polyps, or other lesions;  She had CT Pelvis 9/14 in ER w/ avulsion fracture of the greater trochanter of the proximal right femur;  Exam shows tender to palp in LLQ, no mass felt => we decided to check labs and proceed w/ CT Abd...     COPD is stable> she notes sl cough, occas green sput, stable DOE, etc; on NEBS w/ Albut Qid, Mucinex 2Bid, Fluids; recently had Augmentin & Pred called in for an exac- improved...    CAD, s/pCABG, AFib> followed by DrBerry on Coumadin, Imdur30, Metop50-1/2Bid, Lasix40Bid, K20Bid; BP= 118/60 & she notes irreg tachycardia w/ activity, no CP or ch in SOB...    Hyperlipid> on Simva20 w/ excellent numbers...    Overweight> peak wt in the 260's & she was last 247# in [id number]; today she weighs 218# for a 29# wt reduction; on questioning she isn't really dieting but avoiding sweets, eating smaller portions, & appetite is OK?    Rheum- DJD, chrLBP, compression fx, FM, Paget's, chronic pain syndrome> DrBeekman gives her Percocet, also  takes Neurontin600Qid, Amitriptyine50Qhs We reviewed prob list, meds, xrays and labs> see below for updates >>   LABS 11/14:  Chems- ok x BS=124;  CBC- stable anemia w/ Hg=10.5;  Sed=53;  CRP=7.3.Marland KitchenMarland Kitchen  CT Abd&Pelvis 11/14 is NEG x for prior gastric surg/ GB/ Hyst & a 3cm cystic lesion in left inguinal region ?etiology=> refer to [facility] for their review (she never went)...  ~  July 18, 2013:  6-7wk ROV & add-on for 2wk hx URI characterized by sore throat, cough, dark green phlegm, body aches, fever to 101+ and chills; she had called w/  Augmentin called in, then Medrol dosepak, but she says no better; she had the Flu vaccine 11/14;  Exam showed Temp 101, VSS, nasal congestion, few scat rhonchi w/o consolidation;  CXR showed heart at upper lim of norm, s/p CABG, chronic changes in lungs w/o acute dis/ NAD;  LABS revealed:  Chems- ok x Cr=1.6;  CBC- anemic w/ Hg=9.5 (MCV=101), WBC=4.8;  Sput C&S collected 12/27 showed MRSA=> Rec treatment w/ SeptraDS- 1Bid x10d, Align, Yogurt, Prednisone20mg - 3d tapering schedule, Mucinex- 1200mg Bid, Fluids, Tylenol, etc...     BP, CAD, AFib> stable on meds, see above, continue same...     Other medical issues as noted...   ~  August 25, 2013:  6wk ROV & recheck> seen 12/14 w/ URI, s/p Augmentin & Medrol dosepak but no better, CXR w/ chr changes but NAD, Labs w/ Anemia=9.5 & Cr=1.6, prev sput grew MRSA & given SeptraDS & Pred taper along w/ Mucinex, Fluids, etc (also took Levaquin after the Septra)...  She reports that she is perhaps sl better but has persist cough, green-brown phlegm, some dyspnea, using NEBS w/ Albut Qid;  Her prev Symbicort was stopped in the past by [name] & she has not done as well since then she thinks...  We discussed checking PFT, CT Chest for completeness, recheck sput C&S, adding DULERA200-5 2spBid while continuing her NEBS Bid, Mucinex1200Bid, Fluids, Delsym, Tussionex... She is not on an ACE, coumadin monitored by Cards, on antireflux regimen &  Prilosec 20mg bid...     We reviewed prob list, meds, xrays and labs> see below for updates >> she had the 2014 Flu shot in Nov...  CXR 1/15 showed s/pCABG, incr interstitial markings, NAD...  PFTs 2/15 showed FVC=1.75 (56%), FEV1=1.40 (60%), %1sec=80, mid-flows=71% predicted... This is mostly restricted w/ sm airways dis...  CT Chest =>  Done 08/31/13>  No acute findings; mild bronchial thickening & centrilob/paraseptal emphysema; 71mm RUL nodule & scat adenopathy; cardiomeg, s/p CABG, & atherosclerosis in Ao & coronaries; partial compression of T4...  Sputum for C&S =>  MRSA sens to Clinda, Genta, Linezolid, Rifamp, TCN, Septra, Vanco => treated w/ SeptraDS x 10d...    ~  September 21, 2013:  33mo ROV & post hosp check> Adm 2/15 - 09/07/13 by [facility] w/ COPD exac- incr SOB, cough w/ thick green sput, & chest discomfort; Temp was 102.8 w/ tachycardia & a low BP; PCT was elev at 26 and BNP was elev at 14000, however CXR was neg, culltures all neg (x for the MRSA that grew PTA) & source was unknown; she was treated w/ antibiotics empirically (Levaquin), Steroids, Oxygen, Nebs, Lasix, etc; Breathing is improved, feeling better, wt is down 9#...  Here for f/u visit, recheck labs, & review>>    COPD> on AlbutNEBS-tid, Mucinex, & she stopped Symbicort on her own ("it raised my BP"); we have substituted ADVAIR250-Bid, continue NEBS and Mucinex/ Fluids...    Cards> CAD, s/p CABG, chr AFib> on Imdur30, Metoprolol50Bid, Lasix40Bid, KCl20Bid, Coumadin; [facility] & [facility] are [facility]; BP=120/84 & BNP was up to 14000 & improved w/ diuresis- now 203 & we continued the Lasix40Bid.    Chol> on Simva20; last FLP 3/14 at goals w/ TChol 81, TG 85, HDL 34, LDL 30; continue same, get on diet, get wt down...    Obesity> she has gone 255 ==> 206 today; we reviewed diet, exercise, wt reduction strategies...    GI> GERD-s/p Nissen, IBS, s/p GB & Hyst, 3cm lesion left inguinal area seen on CT 11/14>  on Prilosec20,  colase, GasX; she did not go to [facility] for eval of left inguinal cyst=> needs re-imagining now...    Renal Insuffic> prev labs in the 1.5 to 1.7 range, sl better during her Hosp reading 1.3 to 1.5, she has had her Lasix adjusted...    Rheum> DJD, Compression T4 & L2, chr pain syndrome, FM> she is followed by [name], DrTooke, [name]; on Percocet Tid Prn & Neurontin 600mg ?Qid;  Also takes Amitriptyline 50mg -2hs & requests chenage due to $$- try [id number].    Anxiety> on Alprazolam 0.5mg  Tid as needed... We reviewed prob list, meds, xrays and labs> see below for updates >>   CXR 2/15 showed heart size at upper lim, s/p CABG, clear lungs, NAD.Marland KitchenMarland Kitchen  EKG 2/15 showed AFib, rate125, NSSTTWA...  LABS 3/15:  CBC- Hg=10.5, WBC=2.4 w/ 21% neutrophils;  Fe=58 (18%);  Ferritin=107;  B12=774;  BNP=203;  PCT=0.27;  SPE/IEP- 2 M-spikes are noted & marked incr IgA w/ depressed IgM, IgA lambda paraprot is noted...  ALPHA-1-ANTITRYPSIN level = 202 w/ MM phenotype  AMBULATORY O2 SATS> done 3/15 on RA> O2sat=96% at rest w/ HR104; Ambulated 2 laps w/ lowestO2sat=95% w/ HR131... Needs CT Pelvis to compare to 11/14 => pending... 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 In the interim she was hosp 4/5 - 10/26/13 when she presented w/ altered mental status & found to have T100, LLL pneumonia & poss sepsis (however cult of sput, blood, & urine were all neg); treated w/ broad spectrum antibiotics and improved- disch on Levaquin... Since disch she has been very weak, getting about in a wheelchair, notes sl cough, sm amt of green sput, no hemoptysis, no f/c/s, some SOB w/ activities and some chest discomfort w/ motion...  We  reviewed prob list, meds, xrays and labs> see below for updates >>   CXR 4/15 showed stable cardiomegaly, prior CABG, near complete resolution of left base opacity & CHF changes, DJD Spine, NAD...  Labs 4/15:  Reviewed in Epic... PLAN> asked to continue Albut NEBS Tid, Advair250Bid, Mucinex 1200mg Bid w/ Fluids, & Tessalon perles as needed; she will soon start the Revlimid from DrMohammed...            Problem List:  COPD ([id number]) - ex smoker, quit 4yrs... uses nebulizer w/ ALBUTEROL Qid vs Proventil HFA & MUCINEX 1-2 Bid w/ fluids... baseline CXR shows prev CABG surg, borderline Cor, NAD... prev PFT's 12/05 showed more restriction (from effort and obesity) than obstruction... ~  CXR 10/09 in hosp showed biapical pleural thickening, NAD. ~  5/10:  bronchitic exas treated w/ Levaquin/ Medrol/ etc... 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 ~  12/10:  another exac requiring Levaquin/ Pred... we will add SYMBICORT160- 2sp Bid. ~  Improved w/ addition of Symbicort, but still c/o occas exac requiring antibiotics etc; she stopped the Symbicort on her own... ~  CXR 6/11 showed mild cardiomeg, s/p CABG, calcif Ao, clear/ NAD.Marland Kitchen. ~  CXR 1-2/13 showed borderline heart size, s/p CABG, lungs clear w/ min peribronch thickening, DJD spine, osteopenia... ~  PFT 6/13 showed FVC=2.01 (59%), FEV1=1.62 (64%), %1sec=81, mid-flows=81%pred==> more restricted than obstructed; asked to contue meds & LOSE WEIGHT! ~  CXR  9/13 showed normal heart size, s/p CABG, sl pulm vasc congestion, NAD.Marland Kitchen. ~  CXR 12/13 showed borderline heart size, post op bypass changes, chr bronchitic interstitial lung changes w/o acute dis... ~  7/14: breathing is stable on Albut NEBS, Mucinex, & Levaquin recently called in for bronchitis; she declines to restart Symbicort... ~  12/14: presented w/ acute bronchitic exac> treated w/ Levaquin=> changed to SeptraDS w/ Sput cult showing MRSA; plus Pred taper, Mucinex, etc... ~  CXR 1/15 showed s/p CABG,  incr interstitial markings, NAD... ~  PFTs 2/15 showed FVC=1.75 (56%), FEV1=1.40 (60%), %1sec=80, mid-flows=71% predicted... ~  CT Chest 2/15 showed no acute findings; mild bronchial thickening & centrilob/paraseptal emphysema; 24mm RUL nodule & scat adenopathy; cardiomeg, s/p CABG, & atherosclerosis in Ao & coronaries; partial compression of T4. ~  Sput C&S 2/15 grew MRSA sens to Clinda, Genta, Linezolid, Rifamp, TCN, Septra, Vanco => treated w/ SeptraDS x 10d... ~  subseq hosp 2/15- 09/07/13 by [facility] w/ SOB, cough/sput, neg CXR, neg cultures, pos PCT~26, & elev BNP~14000; she was diuresed, placed on antibiotics (disch on Levaquin) & improved w/ this & COPD rx... ~  3/15: on AlbutNEBS-tid, Mucinex, & she stopped Symbicort on her own ("it raised my BP"); we have substituted ADVAIR250-Bid, continue NEBS and Mucinex/ Fluids... ~  4/15: she was hosp w/ altered mental status, low grade temp, LLL opac & suspicion of sepsis (but all cultures were neg); she responded to broad spectrum antibiotic coverage & disch on Levaquin; Improved but very weak to due [facility] picture etc; asked to continue Albut NEBS Tid, Advair250Bid, Mucinex 1200mg Bid w/ Fluids, & Tessalon perles as needed... ~  CXR 4/15 showed stable cardiomegaly, prior CABG, near complete resolution of left base opacity & CHF changes, DJD Spine, NAD...  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  on IMDUR 30mg /d, METOPROLOL50-1/2Bid, LASIX 40mg Bid, and K20Bid... ~  NuclearStressTests in 8/05 showing infer scar and mild inferolat ischemia...  ~  2DEcho 10/07 showed EF=45-50% and DD...  ~  repeat Myoview in 9/08 showed no ischemia... ~  hosp 10/09 w/ CP- cath showed 90%proxLAD, normCIRC, subtotal midRCA, LIMA & 2 vein grafts patent;  LVF= 60% w/o regional wall motion abn... no change from 2004. ~  repeat cath 6/11 showed no change from 2009 and patent grafts... ~  Labs 6/13 look good & BNP= 179... ~  [facility]  9/13 w/ anemia & GI bleed > labs reviewed; DrKelly incr her Metoprolol to 75mg  Bid... ~  [facility] 12/13 by DrACollins for left TKR... ~  7/14: on ASA81, Metop50-1/2Bid, Imdur30, Lasix40Bid, K20Bid; BP= 150/70 & she denies CP, palpit, ch in SOB/ edema... ~  2/15:  on ASA81, Metop50-1/2Bid, Imdur30, Lasix40Bid, K20Bid; BP= 130/70 & still denies symptoms... ~  subseq hosp 2/15- 09/07/13 by [facility] w/ SOB, cough/sput, neg CXR, neg cultures, pos PCT~26, & elev BNP~14000; she was diuresed, & disch on the same Lasix40Bid... ~  3/15: on Imdur30, Metoprolol50Bid, Lasix40Bid, KCl20Bid, Coumadin; [facility] are [facility]; BP=120/84 & BNP was up to 14000 & improved w/ diuresis- now 203 & we continued the Lasix40Bid.  ATRIAL FIBRILLATION (ICD-427.31) - on COUMADIN per DrBerry... ~  7/51: complic after epidural shot w/ large ecchymosis right lumbar area, PT INR was >6, managed by [facility]. ~  9/13:  Hosp by [facility] w/ anemia (Hg=7.3), pos stools, INR=2.19; Coumadin held transiently, Tx 2u +FFP & VitK; EGD & Colon neg; Coumadin restarted; DrKelly increased her Metoprolol to  75mg Bid... ~  EKG 4/14 showed AFib, rate93, NSSTTWA...  ~  EKG 2/15 hosp showed AFib, rate125, NSSTTWA...  VENOUS INSUFFICIENCY (ICD-459.81) - she follows low sodium diet. elevates legs, wears support hose occas... she is on LASIX 40mg Bid per Cards... ~  9/12: [name] ordered VenDopplers- neg for DVT...  HYPERLIPIDEMIA (ICD-272.4) - on ZOCOR 20mg /d... ~  [place] 2/09 showed TChol 112, TG 76, HDL 43, LDL 54 ~  FLP 3/10 showed TChol 166, TG 137, HDL 40, LDL 98 ~  FLP 10/10 showed TChol 120, Tg 91, HDL 47, LDL 55 ~  12/11 & 6/12: not fasting for today's OV's... ~  4/12: note from [facility] showed TChol 99, TG 68, HDL 43, LDL 42 on Simva20. ~  FLP 1/13 on Simva20 showed TChol 110, TG 98, HDL 47, LDL 43 ~  FLP on 6/13 Simva20 showed TChol 104, TG 96, HDL 50, LDL 35 ~  FLP 3/14 on Simva20 showed TChol 81, TG 85, HDL 34, LDL  30  MORBID OBESITY (ICD-278.01) - we have discussed diet and exercise program on numerous occas in the past and again today! ~  weight 2/10 = 250# ~  weight 7/10 = 262# ~  weight 8/10 = 245# ~  weight 12/10 = 266# "it's a fighting battle" ~  weight 4/11 = 262# ~  weight 8/11 = 256# ~  weight 12/11 = 263#... we reviewed diet + exercise thereapy. ~  Weight 6/12 = 271# ~  Weight 1/13 = 266# ~  Weight 6/13 = 255# ~  Weight 11/13 = 245# ~  Weight 4/14 = 256# ~  Weight 7/14 = 247# ~  Weight 11/14 = 218# ~  Weight 2/15 = 215# ~  Weight 3/15 = 206#  GERD (ICD-530.81) - on OMEPRAZOLE 20mg Bid... ?[facility] changed to Aciphex? ~  EGD 9/13 by DrHung showed intact Nissen fundoplication, normal esoph, gastric mucosa, and duod  IBS (ICD-564.1) - colonoscopy was 9/07 by Demetra Shiner and showed hems only... 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 ~  Colonoscopy 9/13 by DrStark was normal- no divertics, AVMs, polyps, or other lesions... ~  11/14: presents w/ LLQ pain & sl tenderness x several weeks- denies n/v, +constip going 1-2 per wk and assoc w/ fair appetite and 29# weight loss; GI hx as noted; She had CT Pelvis 9/14 in ER w/ avulsion fracture of the greater trochanter of the proximal right femur;  Exam shows tender to palp in LLQ, no mass felt => we decided to check labs and proceed w/ CT Abd => NEG x for 3cm cystic lesion in left inguinal region => refer to [facility] (she never went). ~  3/15: Hx GERD-s/p Nissen on Prilosec20; Hx IBS on Colase & GasX;, s/p GB & Hyst; & 3cm lesion left inguinal area seen on CT 11/14> needs re-imagining now...  RENAL INSUFFICIENCY (ICD-588.9) - Creat in the 1.6-1.7 range... ~  labs 2/09 showed BUN= 16, Creat= 1.6 ~  labs 3/10 showed BUN= 20, Creat= 1.7 ~  labs 4/11 showed BUN= 15, Creat= 1.6 ~  labs in hosp 6/11 showed BUN= 9, Creat= 1.3 ~  Labs here 6/12 showed BUN= 21, Creat= 1.6 ~  Labs 1/13 on Lasix40Bid showed BUN=15, Creat= 1.7 ~  Labs 6/13 on Lasix40Bid  showed BUN= 21, Creat= 1.7 ~  Labs 9/13 in hosp showed Creat= 1.2-1.3 ~  Labs 11/13 on Lasix40Bid showed BUN= 25, Creat= 1.8 ~  Labs 3/14 on Lasix40Bid+K20Bid showed K=5.1, BUN=22, Cr=1.7 ~  Labs 11/14 on Lasix40Bid+K20Bid showed K=4.8,  Cr=1.3 ~  Labs 12/14 on same meds showed Cr=1.6  OTHER SYMPTOMS INVOLVING URINARY SYSTEM (ICD-788.99) - seen by [place] 10/11 w/ female stress incontinence, cystocele, & UTI... she responded to [place] 8mg  for overact bladder symptoms==> no longer on her med list...  DEGENERATIVE JOINT DISEASE >> LOW BACK PAIN, CHRONIC - eval and treated by Rayford Halsted, now DrBeekman> "they do it all now" w/ rechecks Q3-18months... she takes OXYCODONE 5/325, NEURONTIN 600mg Bid, etc... COMPRESSION FRACTURE - found to have compression T12 & L2 4/11 treated by [facility]- DrTooke... CHRONIC PAIN SYNDROME - managed by Renaee Munda... FIBROMYALGIA - on AMITRIPTYLINE 50mg - 2Qhs & FLEXERIL 10mg  Tid... PAGET'S DISEASE - on Observation per [facility], Rheumatology... ~  labs 2/09 showed AlkPhos= 79 ~  labs 3/10 showed AlkPhos= 86 ~  labs 4/11 showed AlkPhos= 70 ~  labs 6/11 in hosp showed AlkPhos = 71 ~  Bone Scan 6/11 showed stable Paget's distal left tibia, compress fx L2, OA of both knees. ~  Labs 6/13 showed AlkPhos= 86, remains wnl... ~  Labs 3/14 showed AlkPhos= 108 ~  We do not have notes from Rheum to review...  SYNCOPE >> Adm 4/14 by [facility]- assoc w/ postural change by history & UTI via ER... ~  4/14:  CT Head in ER showed mild diffuse atrophy & vol loss w/ microvasc ischemic dis, no acute intracranial process... ~  4/14:  No postural BP changes in office today; she has been treated for UTI found in ER last week; we will order MRI & CDopplers; she will f/u w/ Cards... 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    ANXIETY - on ALPRAZOLAM 0.5mg Qid prn...  Hx of HERPES ZOSTER (ICD-053.9), & POSTHERPETIC NEURALGIA (ICD-053.19)  ANEMIA >>  ~  Labs 11/13 showed Hg=10.7, MCV=92, Fe=88 (21%sat)... ~  Labs 3/14 showed Hg=11.4, MCV=91, Fe=62 (17%sat)... ~  Labs 11/14 showed Hg=10.5, MCV=95 ~  Labs 12/14 showed Hg= 9.5.Marland KitchenMarland Kitchen Continue supplements... ~  Labs 3/15 showed Hg= 10.5, WBC=2.4 w/ 21% neutrophils, 61% lymphs, 12% monos; Fe=58 (18%sat), Ferritin=107; B12=774; SPE/IEP w/ 2 M-spikes and IgA lambda paraprot w/ marked incr IGA level & depressed IgM=> PT REFERRED TO [facility] FOR THEIR REVIEW AND CONSULTATION...    Past Surgical History  Procedure Laterality Date  . Vesicovaginal fistula closure w/ tah    . Cardiac catheterization  01/06/2010    RCA mid artery stenosis at 99%, LAD proximal occlusion 90%  at origin of ramus  . Nissen fundoplication    . Esophagogastroduodenoscopy  04/09/2012    Procedure: ESOPHAGOGASTRODUODENOSCOPY (EGD);  Surgeon: Beryle Beams, MD;  Location: [facility] ENDOSCOPY;  Service: Endoscopy;  Laterality: N/A;  tentatively scheduled per Trula Slade due to patients breathing problems  . Colonoscopy  04/11/2012    Procedure: COLONOSCOPY;  Surgeon: Ladene Artist, MD,FACG;  Location[facility] ENDOSCOPY;  Service: Endoscopy;  Laterality: N/A;  . Abdominal hysterectomy  at 73 years old  . Appendectomy  about 50 years ago  . Coronary artery bypass graft  1997    LIMA to LAD, SVG to ramus, SVG to RCA  . Cholecystectomy  50 years ago  . Neck surgery  73 years old    full movement  . Right knee arthroscopy  3  years ago  . Eye surgery  2005    cataracts both eyes  . Back surgery      x5, "birdcage" and fused in lower back  . Total knee arthroplasty  06/28/2012    Procedure: TOTAL KNEE ARTHROPLASTY;  Surgeon: Sydnee Cabal, MD;  Location: [facility] ORS;  Service: Orthopedics;  Laterality: Left;  . Joint  replacement Left 06/28/12    knee replacement    Outpatient Encounter Prescriptions as of 11/06/2013  Medication Sig  . albuterol (PROVENTIL) (2.5 MG/3ML) 0.083% nebulizer solution Take 3 mLs (2.5 mg total) by nebulization 4 (four) times daily as needed for wheezing or shortness of breath.  . ALPRAZolam (XANAX) 0.5 MG tablet Take 0.25-0.5 mg by mouth 4 (four) times daily as needed for anxiety.  Marland Kitchen aspirin EC 81 MG tablet Take 81 mg by mouth daily after supper.   . benzonatate (TESSALON) 100 MG capsule Take 1 capsule (100 mg total) by mouth 3 (three) times daily as needed for cough.  . Calcium Carbonate-Vitamin D (CALCIUM-VITAMIN D) 500-200 MG-UNIT per tablet Take 1 tablet by mouth 2 (two) times daily with a meal.  . clotrimazole (MYCELEX) 10 MG troche Take 1 tablet (10 mg total) by mouth 5 (five) times daily. X 7 days  . docusate sodium (COLACE) 100 MG capsule Take 2 capsules (200 mg total) by mouth daily as needed for mild constipation.  . ferrous sulfate 325 (65 FE) MG tablet Take 325 mg by mouth every evening.  . Fluticasone-Salmeterol (ADVAIR DISKUS) 250-50 MCG/DOSE AEPB Inhale 1 puff into the lungs 2 (two) times daily.  . furosemide (LASIX) 40 MG tablet Take 40 mg by mouth 2 (two) times daily.   Marland Kitchen gabapentin (NEURONTIN) 600 MG tablet Take 0.5 tablets (300 mg total) by mouth 3 (three) times daily.  Marland Kitchen guaiFENesin (MUCINEX) 600 MG 12 hr tablet Take 1,200 mg by mouth 2 (two) times daily.  . isosorbide mononitrate (IMDUR) 30 MG 24 hr tablet Take 1 tablet (30 mg total) by mouth at bedtime.  . metoprolol (LOPRESSOR) 50 MG tablet Take 1 tablet (50 mg total) by mouth 2 (two) times daily.  . Multiple Vitamin (MULTIVITAMIN WITH MINERALS) TABS Take 1 tablet by mouth every morning.   . nitroGLYCERIN (NITROSTAT) 0.4 MG SL tablet Place 0.4 mg under the tongue every 5 (five) minutes as needed for chest pain.  . nortriptyline (PAMELOR) 50 MG capsule Take 1 capsule (50 mg total) by mouth at bedtime.  Marland Kitchen  omeprazole (PRILOSEC) 20 MG capsule Take 20 mg by mouth 2 (two) times daily.  . ondansetron (ZOFRAN) 4 MG tablet Take 4 mg by mouth every 8 (eight) hours as needed for nausea or vomiting.  Marland Kitchen oxyCODONE-acetaminophen (PERCOCET/ROXICET) 5-325 MG per tablet Take 2 tablets by mouth every 4 (four) hours as needed for pain.  . potassium chloride SA (K-DUR,KLOR-CON) 20 MEQ tablet Take 20 mEq by mouth 2 (two) times daily.  . simvastatin (ZOCOR) 20 MG tablet Take 20 mg by mouth at bedtime.  Marland Kitchen tiZANidine (ZANAFLEX) 4 MG tablet Take 4 mg by mouth 3 (three) times daily.   Marland Kitchen warfarin (COUMADIN) 5 MG tablet Take 2.5-5 mg by mouth every morning. She takes half a tablet on Monday, Wednesday, Friday and one tablet on Tuesday, Thursday, Saturday and Sunday.  . lenalidomide (REVLIMID) 10 MG capsule Take 1 capsule (10 mg total) by mouth daily. Take 1 capsule ( 10 mg) by mouth daily for 21 days every 4 weeks  . [DISCONTINUED] SYMBICORT 160-4.5 MCG/ACT inhaler  No Known Allergies   Current Medications, Allergies, Past Medical History, Past Surgical History, Family History, and Social History were reviewed in [employer] record.    Review of Systems         See HPI - all other systems neg except as noted... The patient complains of decreased hearing, dyspnea on exertion, weakness, headaches, incontinence, and depression.  The patient denies anorexia, fever, weight loss, weight gain, vision loss, hoarseness, chest pain, syncope, peripheral edema, prolonged cough, hemoptysis, melena, hematochezia, abd pain, severe indigestion/heartburn, hematuria, muscle weakness, suspicious skin lesions, transient blindness, difficulty walking, unusual weight change, abnormal bleeding, enlarged lymph nodes, and angioedema.    Objective:   Physical Exam     WD, Obese, 73 y/o WF in NAD... GENERAL:  Alert & oriented; pleasant & cooperative... HEENT:  [place]/AT, EOM- full, PERRLA, EACs-clear, TMs-wnl, NOSE-clear,  THROAT-clear & wnl. NECK:  Supple w/ fairROM; no JVD; normal carotid impulses w/o bruits; no thyromegaly or nodules palpated; no lymphadenopathy. CHEST:  Coarse BS w/ no wheezing, no rales, no signs of consolidation... HEART:  Regular Rhythm; without murmurs/ rubs/ or gallops heard... ABDOMEN:  Obese, soft & nontender; normal bowel sounds, no organomegaly or masses detected, no inguinal lesion palpated. EXT: without deformities, mild arthritic changes; no varicose veins/ +venous insuffic/ 1+ edema. NEURO:  no focal neuro deficits... DERM:  along right lateral chest wall, under breast & over the costal margin- scarring in skin from shingles rash...  RADIOLOGY DATA:  Reviewed in the EPIC EMR & discussed w/ the patient...  LABORATORY DATA:  Reviewed in the EPIC EMR & discussed w/ the patient...   Assessment & Plan:    LLL CAP 4/15>> hosp records reviewed, she responded to broad spectrum antibiotics & symptoms improved; disch on Levaquin 7 she has finished this Rx- improved, just weak etc... COPD>  Stable overall despite her chronic symptoms> continue NEBS, ADVAIR, Mucinex, Fluids;  PFTs look more restricted than obstructed & she is advised to lose wt & concentrate on good deep breaths... 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   ASHD/ CAD/ s/p CABG>  Followed by Cyndra [facility]; continue same meds; needs cardiac w/u for poss causes of Syncope...  AFIB>  On Coumadin per [facility], continue same Rx...  HYPERLIPIDEMIA>  FLP looks great, continue Simva20...  OBESITY>  We reviewed diet & exercise program; weight is coming down but she's not sure why?  GI Bleed>  Hosp 9/13 w/ Anemia & GI bleed; neg EGD & Colon, no source found... LLQ Abd pain ?etiology> she has  fair appetite, 29# wt loss over 17mo, and we are checking CT Abd => NEG x for 3cm cystic lesion in left inguinal region => she neve went to [facility] & we need to re-image this area...  Renal Insuffic>  Creat= 1.4-1.6 on her current regimen; advised to drink plenty of water & maintain hydration...  ORTHO>  LBP/ Compression fx/ FM/ Paget's/ Chr Pain Syndrome> managed by [name] at [facility] he took over [facility]; we discussed checking w/ DrBeekman before considering Ortho surg...  Anxiety>  On Alpraz prn...  Anemia, Neutropenia, Pancytopenia>  She was Tx 2u in hosp 9/13; Labs showed Hg= 9.5; continue supplements... 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   Patient's Medications  New Prescriptions   No medications on file  Previous Medications   ALBUTEROL (PROVENTIL) (2.5 MG/3ML) 0.083% NEBULIZER SOLUTION    Take 3 mLs (2.5 mg total) by nebulization 4 (four) times daily as needed for wheezing or shortness of breath.   ALPRAZOLAM (XANAX) 0.5 MG TABLET    Take 0.25-0.5 mg by mouth 4 (four) times daily as needed for anxiety.   ASPIRIN EC 81 MG TABLET    Take 81 mg by mouth daily after supper.    BENZONATATE (TESSALON) 100 MG CAPSULE    Take 1 capsule (100 mg total) by mouth 3 (three) times daily as needed for cough.   CALCIUM CARBONATE-VITAMIN D (CALCIUM-VITAMIN D) 500-200 MG-UNIT PER TABLET    Take 1 tablet by mouth 2 (two) times daily with a meal.   CLOTRIMAZOLE (MYCELEX) 10 MG TROCHE    Take 1 tablet (10 mg total) by mouth 5 (five) times daily. X 7 days   DOCUSATE SODIUM (COLACE) 100 MG CAPSULE    Take 2 capsules (200 mg total) by mouth daily as needed for mild constipation.   FERROUS  SULFATE 325 (65 FE) MG TABLET    Take 325 mg by mouth every evening.   FLUTICASONE-SALMETEROL (ADVAIR DISKUS) 250-50 MCG/DOSE AEPB    Inhale 1 puff into the lungs 2 (two) times daily.   FUROSEMIDE (LASIX) 40 MG TABLET    Take 40 mg by mouth 2 (two) times daily.    GABAPENTIN (NEURONTIN) 600 MG TABLET    Take 0.5 tablets (300 mg total) by mouth 3 (three) times daily.   GUAIFENESIN (MUCINEX) 600 MG 12 HR TABLET    Take 1,200 mg by mouth 2 (two) times daily.   ISOSORBIDE MONONITRATE (IMDUR) 30 MG 24 HR TABLET    Take 1 tablet (30 mg total) by mouth at bedtime.   LENALIDOMIDE (REVLIMID) 10 MG CAPSULE    Take 1 capsule (10 mg total) by mouth daily. Take 1 capsule ( 10 mg) by mouth daily for 21 days every 4 weeks   METOPROLOL (LOPRESSOR) 50 MG TABLET    Take 1 tablet (50 mg total) by mouth 2 (two) times daily.   MULTIPLE VITAMIN (MULTIVITAMIN WITH MINERALS) TABS    Take 1 tablet by mouth every morning.    NITROGLYCERIN (NITROSTAT) 0.4 MG SL TABLET    Place 0.4 mg under the tongue every 5 (five) minutes as needed for chest pain.   NORTRIPTYLINE (PAMELOR) 50 MG CAPSULE    Take 1 capsule (50 mg total) by mouth at bedtime.   OMEPRAZOLE (PRILOSEC) 20 MG CAPSULE    Take 20 mg by mouth 2 (two) times daily.   ONDANSETRON (ZOFRAN) 4 MG TABLET    Take 4 mg by mouth every 8 (eight) hours as needed for nausea or vomiting.   OXYCODONE-ACETAMINOPHEN (PERCOCET/ROXICET) 5-325 MG PER TABLET    Take 2 tablets by mouth every 4 (four) hours as needed for pain.   POTASSIUM CHLORIDE SA (K-DUR,KLOR-CON) 20 MEQ TABLET    Take 20 mEq by mouth 2 (two) times daily.   SIMVASTATIN (ZOCOR) 20 MG TABLET    Take 20 mg by mouth at bedtime.   TIZANIDINE (  ZANAFLEX) 4 MG TABLET    Take 4 mg by mouth 3 (three) times daily.    WARFARIN (COUMADIN) 5 MG TABLET    Take 2.5-5 mg by mouth every morning. She takes half a tablet on Monday, Wednesday, Friday and one tablet on Tuesday, Thursday, Saturday and Sunday.  Modified Medications   No  medications on file  Discontinued Medications   SYMBICORT 160-4.5 MCG/ACT INHALER

## 2013-11-07 ENCOUNTER — Telehealth: Payer: Self-pay | Admitting: *Deleted

## 2013-11-07 NOTE — Telephone Encounter (Signed)
Received fax from [facility] is approved through 11/07/2014.

## 2013-11-08 ENCOUNTER — Encounter (HOSPITAL_COMMUNITY): Payer: Self-pay | Admitting: Emergency Medicine

## 2013-11-08 ENCOUNTER — Emergency Department (HOSPITAL_COMMUNITY)
Admission: EM | Admit: 2013-11-08 | Discharge: 2013-11-08 | Disposition: A | Discharge status: Home / Self Care | Payer: Medicare Other | Attending: Emergency Medicine | Admitting: Emergency Medicine

## 2013-11-08 ENCOUNTER — Telehealth: Payer: Self-pay | Admitting: Pulmonary Disease

## 2013-11-08 ENCOUNTER — Other Ambulatory Visit: Payer: Self-pay

## 2013-11-08 ENCOUNTER — Emergency Department (HOSPITAL_COMMUNITY): Payer: Medicare Other

## 2013-11-08 DIAGNOSIS — Z7901 Long term (current) use of anticoagulants: Secondary | ICD-10-CM | POA: Insufficient documentation

## 2013-11-08 DIAGNOSIS — Z79899 Other long term (current) drug therapy: Secondary | ICD-10-CM | POA: Insufficient documentation

## 2013-11-08 DIAGNOSIS — E785 Hyperlipidemia, unspecified: Secondary | ICD-10-CM | POA: Insufficient documentation

## 2013-11-08 DIAGNOSIS — I959 Hypotension, unspecified: Secondary | ICD-10-CM

## 2013-11-08 DIAGNOSIS — Z8619 Personal history of other infectious and parasitic diseases: Secondary | ICD-10-CM | POA: Insufficient documentation

## 2013-11-08 DIAGNOSIS — I2510 Atherosclerotic heart disease of native coronary artery without angina pectoris: Secondary | ICD-10-CM | POA: Insufficient documentation

## 2013-11-08 DIAGNOSIS — Z87448 Personal history of other diseases of urinary system: Secondary | ICD-10-CM | POA: Insufficient documentation

## 2013-11-08 DIAGNOSIS — R509 Fever, unspecified: Secondary | ICD-10-CM | POA: Insufficient documentation

## 2013-11-08 DIAGNOSIS — G8929 Other chronic pain: Secondary | ICD-10-CM | POA: Insufficient documentation

## 2013-11-08 DIAGNOSIS — Z87891 Personal history of nicotine dependence: Secondary | ICD-10-CM | POA: Insufficient documentation

## 2013-11-08 DIAGNOSIS — Z951 Presence of aortocoronary bypass graft: Secondary | ICD-10-CM | POA: Insufficient documentation

## 2013-11-08 DIAGNOSIS — I4891 Unspecified atrial fibrillation: Secondary | ICD-10-CM | POA: Insufficient documentation

## 2013-11-08 DIAGNOSIS — M1990 Unspecified osteoarthritis, unspecified site: Secondary | ICD-10-CM | POA: Insufficient documentation

## 2013-11-08 DIAGNOSIS — J449 Chronic obstructive pulmonary disease, unspecified: Secondary | ICD-10-CM | POA: Insufficient documentation

## 2013-11-08 DIAGNOSIS — Z8739 Personal history of other diseases of the musculoskeletal system and connective tissue: Secondary | ICD-10-CM | POA: Insufficient documentation

## 2013-11-08 DIAGNOSIS — J4489 Other specified chronic obstructive pulmonary disease: Secondary | ICD-10-CM | POA: Insufficient documentation

## 2013-11-08 DIAGNOSIS — E869 Volume depletion, unspecified: Secondary | ICD-10-CM | POA: Insufficient documentation

## 2013-11-08 DIAGNOSIS — R071 Chest pain on breathing: Secondary | ICD-10-CM | POA: Insufficient documentation

## 2013-11-08 DIAGNOSIS — I252 Old myocardial infarction: Secondary | ICD-10-CM | POA: Insufficient documentation

## 2013-11-08 DIAGNOSIS — Z9889 Other specified postprocedural states: Secondary | ICD-10-CM | POA: Insufficient documentation

## 2013-11-08 DIAGNOSIS — D649 Anemia, unspecified: Secondary | ICD-10-CM | POA: Insufficient documentation

## 2013-11-08 DIAGNOSIS — K219 Gastro-esophageal reflux disease without esophagitis: Secondary | ICD-10-CM | POA: Insufficient documentation

## 2013-11-08 DIAGNOSIS — Z7982 Long term (current) use of aspirin: Secondary | ICD-10-CM | POA: Insufficient documentation

## 2013-11-08 DIAGNOSIS — Z8701 Personal history of pneumonia (recurrent): Secondary | ICD-10-CM | POA: Insufficient documentation

## 2013-11-08 DIAGNOSIS — F411 Generalized anxiety disorder: Secondary | ICD-10-CM | POA: Insufficient documentation

## 2013-11-08 LAB — COMPREHENSIVE METABOLIC PANEL
ALBUMIN: 3 g/dL — AB (ref 3.5–5.2)
ALK PHOS: 143 U/L — AB (ref 39–117)
ALT: 13 U/L (ref 0–35)
AST: 33 U/L (ref 0–37)
BILIRUBIN TOTAL: 0.8 mg/dL (ref 0.3–1.2)
BUN: 21 mg/dL (ref 6–23)
CHLORIDE: 99 meq/L (ref 96–112)
CO2: 24 meq/L (ref 19–32)
Calcium: 9 mg/dL (ref 8.4–10.5)
Creatinine, Ser: 1.49 mg/dL — ABNORMAL HIGH (ref 0.50–1.10)
GFR calc Af Amer: 39 mL/min — ABNORMAL LOW (ref >90)
GFR, EST NON AFRICAN AMERICAN: 34 mL/min — AB (ref >90)
Glucose, Bld: 102 mg/dL — ABNORMAL HIGH (ref 70–99)
POTASSIUM: 4.4 meq/L (ref 3.7–5.3)
SODIUM: 138 meq/L (ref 137–147)
Total Protein: 7.3 g/dL (ref 6.0–8.3)

## 2013-11-08 LAB — URINALYSIS, ROUTINE W REFLEX MICROSCOPIC
BILIRUBIN URINE: NEGATIVE
GLUCOSE, UA: NEGATIVE mg/dL
Hgb urine dipstick: NEGATIVE
KETONES UR: NEGATIVE mg/dL
Leukocytes, UA: NEGATIVE
Nitrite: NEGATIVE
PH: 6 (ref 5.0–8.0)
Protein, ur: NEGATIVE mg/dL
SPECIFIC GRAVITY, URINE: 1.011 (ref 1.005–1.030)
Urobilinogen, UA: 1 mg/dL (ref 0.0–1.0)

## 2013-11-08 LAB — CBG MONITORING, ED: GLUCOSE-CAPILLARY: 99 mg/dL (ref 70–99)

## 2013-11-08 LAB — CBC
HCT: 29.6 % — ABNORMAL LOW (ref 36.0–46.0)
Hemoglobin: 9.4 g/dL — ABNORMAL LOW (ref 12.0–15.0)
MCH: 31 pg (ref 26.0–34.0)
MCHC: 31.8 g/dL (ref 30.0–36.0)
MCV: 97.7 fL (ref 78.0–100.0)
PLATELETS: 74 10*3/uL — AB (ref 150–400)
RBC: 3.03 MIL/uL — AB (ref 3.87–5.11)
RDW: 21.7 % — AB (ref 11.5–15.5)
WBC: 5.6 10*3/uL (ref 4.0–10.5)

## 2013-11-08 LAB — I-STAT CG4 LACTIC ACID, ED: Lactic Acid, Venous: 1.67 mmol/L (ref 0.5–2.2)

## 2013-11-08 LAB — DIFFERENTIAL
Band Neutrophils: 7 % (ref 0–10)
Basophils Absolute: 0 10*3/uL (ref 0.0–0.1)
Basophils Relative: 0 % (ref 0–1)
Blasts: 27 %
EOS ABS: 0 10*3/uL (ref 0.0–0.7)
EOS PCT: 0 % (ref 0–5)
LYMPHS PCT: 53 % — AB (ref 12–46)
Lymphs Abs: 3 10*3/uL (ref 0.7–4.0)
MYELOCYTES: 9 %
Metamyelocytes Relative: 0 %
Monocytes Absolute: 0 10*3/uL — ABNORMAL LOW (ref 0.1–1.0)
Monocytes Relative: 0 % — ABNORMAL LOW (ref 3–12)
NEUTROS ABS: 1.1 10*3/uL — AB (ref 1.7–7.7)
NEUTROS PCT: 4 % — AB (ref 43–77)
Promyelocytes Absolute: 0 %
nRBC: 3 /100 WBC — ABNORMAL HIGH

## 2013-11-08 LAB — PROTIME-INR
INR: 2.31 — ABNORMAL HIGH (ref 0.00–1.49)
Prothrombin Time: 24.6 seconds — ABNORMAL HIGH (ref 11.6–15.2)

## 2013-11-08 LAB — PATHOLOGIST SMEAR REVIEW

## 2013-11-08 MED ORDER — SODIUM CHLORIDE 0.9 % IV SOLN: INTRAVENOUS | Status: DC

## 2013-11-08 MED ORDER — SODIUM CHLORIDE 0.9 % IV BOLUS (SEPSIS)
1000.0000 mL | Freq: Once | INTRAVENOUS | Status: AC
  Administered 2013-11-08: 1000 mL via INTRAVENOUS

## 2013-11-08 MED ORDER — ACETAMINOPHEN 325 MG PO TABS
650.0000 mg | ORAL_TABLET | Freq: Once | ORAL | Status: AC
  Administered 2013-11-08: 650 mg via ORAL
  Filled 2013-11-08: qty 2

## 2013-11-08 MED ORDER — SODIUM CHLORIDE 0.9 % IV SOLN
INTRAVENOUS | Status: DC
  Administered 2013-11-08: 07:00:00 via INTRAVENOUS

## 2013-11-08 NOTE — ED Notes (Signed)
Gave pt sprite and crackers 

## 2013-11-08 NOTE — Telephone Encounter (Signed)
No message needed °

## 2013-11-08 NOTE — Discharge Instructions (Signed)
Do not take the Imdur (nitroglycerin), or Lopressor (metoprolol), until you see your Dr.   Suzette Bowers to eat 3 meals a day, and drink an extra, one to 2 quarts of water each day. Use Tylenol, if needed for fever. See, your doctor in 2 days for a checkup.     Dehydration, Adult Dehydration means your body does not have as much fluid as it needs. Your kidneys, brain, and heart will not work properly without the right amount of fluids and salt.  HOME CARE  Ask your doctor how to replace body fluid losses (rehydrate).  Drink enough fluids to keep your pee (urine) clear or pale yellow.  Drink small amounts of fluids often if you feel sick to your stomach (nauseous) or throw up (vomit).  Eat like you normally do.  Avoid:  Foods or drinks high in sugar.  Bubbly (carbonated) drinks.  Juice.  Very hot or cold fluids.  Drinks with caffeine.  Fatty, greasy foods.  Alcohol.  Tobacco.  Eating too much.  Gelatin desserts.  Wash your hands to avoid spreading germs (bacteria, viruses).  Only take medicine as told by your doctor.  Keep all doctor visits as told. GET HELP RIGHT AWAY IF:   You cannot drink something without throwing up.  You get worse even with treatment.  Your vomit has blood in it or looks greenish.  Your poop (stool) has blood in it or looks black and tarry.  You have not peed in 6 to 8 hours.  You pee a small amount of very dark pee.  You have a fever.  You pass out (faint).  You have belly (abdominal) pain that gets worse or stays in one spot (localizes).  You have a rash, stiff neck, or bad headache.  You get easily annoyed, sleepy, or are hard to wake up.  You feel weak, dizzy, or very thirsty. MAKE SURE YOU:   Understand these instructions.  Will watch your condition.  Will get help right away if you are not doing well or get worse. Document Released: 05/02/2009 Document Revised: 09/28/2011 Document Reviewed: 02/23/2011 [facility]  Patient Information 2014 [place], [place].  Hypotension As your heart beats, it forces blood through your arteries. This force is your blood pressure. If your blood pressure is too low for you to go about your normal activities or to support the organs of your body, you have hypotension. Hypotension is also referred to as low blood pressure. When your blood pressure becomes too low, you may not get enough blood to your brain. As a result, you may feel weak, feel lightheaded, or develop a rapid heart rate. In a more severe case, you may faint. CAUSES Various conditions can cause hypotension. These include:  Blood loss.  Dehydration.  Heart or endocrine problems.  Pregnancy.  Severe infection.  Not having a well-balanced diet filled with needed nutrients.  Severe allergic reactions (anaphylaxis). Some medicines, such as blood pressure medicine or water pills (diuretics), may lower your blood pressure below normal. Sometimes taking too much medicine or taking medicine not as directed can cause hypotension. TREATMENT  Hospitalization is sometimes required for hypotension if fluid or blood replacement is needed, if time is needed for medicines to wear off, or if further monitoring is needed. Treatment might include changing your diet, changing your medicines (including medicines aimed at raising your blood pressure), and use of support stockings. HOME CARE INSTRUCTIONS   Drink enough fluids to keep your urine clear or pale yellow.  Take your  medicines as directed by your health care provider.  Get up slowly from reclining or sitting positions. This gives your blood pressure a chance to adjust.  Wear support stockings as directed by your health care provider.  Maintain a healthy diet by including nutritious food, such as fruits, vegetables, nuts, whole grains, and lean meats. SEEK MEDICAL CARE IF:  You have vomiting or diarrhea.  You have a fever for more than 2 3 days.  You feel more  thirsty than usual.  You feel weak and tired. SEEK IMMEDIATE MEDICAL CARE IF:   You have chest pain or a fast or irregular heartbeat.  You have a loss of feeling in some part of your body, or you lose movement in your arms or legs.  You have trouble speaking.  You become sweaty or feel lightheaded.  You faint. MAKE SURE YOU:   Understand these instructions.  Will watch your condition.  Will get help right away if you are not doing well or get worse. Document Released: 07/06/2005 Document Revised: 04/26/2013 Document Reviewed: 01/06/2013 [facility] Patient Information 2014 [place].

## 2013-11-08 NOTE — ED Notes (Signed)
Family states that around 1am pt became disoriented and was not making sense; husband states that pt just seemed confused and he reports a temp of 101.8 at home; pt is awake and alert in triage and able to answer questions appropriately; pt c/o left side rib and back pain; pt states that she productive cough w/ green sputum

## 2013-11-08 NOTE — ED Provider Notes (Signed)
CSN: [id number]     Arrival date & time 11/08/13  5784 History   First MD Initiated Contact with Patient 11/08/13 [phone]     Chief Complaint  Patient presents with  . Altered Mental Status     (Consider location/radiation/quality/duration/timing/severity/associated sxs/prior Treatment) Patient is a 73 y.o. female presenting with altered mental status. The history is provided by the patient.  Altered Mental Status  Her husband noticed that she was confused at 1 AM today. He checked her temperature and found it to be high. She did not get any medication for fever. Later this morning, he decided to bring her here. After receiving initial treatment in the emergency department including Tylenol, the patient feels better and both she and her husband state that she is back to her baseline. They deny cough, sputum production, dysuria, nausea, or vomiting. She has been well since discharge from the hospital 2 weeks ago. She has seen both her hematologist, and her primary care doctor. Her PCP did a followup chest x-ray, 2 days ago, that was normal. There are no other known modifying factors.    Past Medical History  Diagnosis Date  . Coronary atherosclerosis of unspecified type of vessel, native or graft   . Atrial fibrillation 01/19/2012    stress test - non-gated secondary to A fib,  normal myocaridal perfusion study  . Unspecified venous (peripheral) insufficiency   . Other and unspecified hyperlipidemia   . Morbid obesity   . Esophageal reflux   . Irritable bowel syndrome   . Other symptoms involving urinary system(788.99)   . Chronic pain syndrome   . 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 Anxiety state, unspecified   . Herpes zoster without mention of complication   . Herpes zoster with other nervous system complications(053.19)   . Lumbago   . Anticoagulated on Coumadin, chronic for A. Fib 04/08/2012  . COPD (chronic obstructive pulmonary disease)   . Unspecified disorder  resulting from impaired renal function   . Pneumonia 4 years ago  . Bronchitis, chronic   . Fibromyalgia   . Arthritis   . CAD (coronary artery disease) 04/29/2006    echo -EF 45-50%, impaired LV relaxation  . 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 GI bleed 04/07/2012    endoscopy/colonoscopy unremarkable  . 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 DJD (degenerative joint disease)   . Myocardial infarction   . Anemia    Past Surgical History  Procedure Laterality Date  . Vesicovaginal fistula closure w/ tah    . Cardiac catheterization  01/06/2010    RCA mid artery stenosis at 99%, LAD proximal occlusion 90%  at origin of ramus  . Nissen fundoplication    . Esophagogastroduodenoscopy  04/09/2012    Procedure: ESOPHAGOGASTRODUODENOSCOPY (EGD);  Surgeon: Beryle Beams, MD;  Location: [facility] ENDOSCOPY;  Service: Endoscopy;  Laterality: N/A;  tentatively scheduled per Trula Slade due to patients breathing problems  . Colonoscopy  04/11/2012    Procedure: COLONOSCOPY;  Surgeon: Ladene Artist, MD,FACG;  Location: [facility] ENDOSCOPY;  Service: Endoscopy;  Laterality: N/A;  . Abdominal hysterectomy  at 73 years old  . Appendectomy  about 50 years ago  . Coronary artery bypass graft  1997    LIMA to LAD, SVG to ramus, SVG to RCA  . Cholecystectomy  50 years ago  . Neck surgery  73 years old    full movement  . Right knee  arthroscopy  3 years ago  . Eye surgery  2005    cataracts both eyes  . Back surgery      x5, "birdcage" and fused in lower back  . Total knee arthroplasty  06/28/2012    Procedure: TOTAL KNEE ARTHROPLASTY;  Surgeon: Sydnee Cabal, MD;  Location: [facility] ORS;  Service: Orthopedics;  Laterality: Left;  . Joint replacement Left 06/28/12    knee replacement   Family History  Problem Relation Age of Onset  . Heart disease Mother   . Lung disease Father   . Kidney disease     History  Substance Use Topics  . Smoking status: Former Smoker  -- 1.00 packs/day for 22 years    Types: Cigarettes    Quit date: 07/20/1984  . Smokeless tobacco: Never Used  . Alcohol Use: No   OB History   Grav Para Term Preterm Abortions TAB SAB Ect Mult Living                 Review of Systems  All other systems reviewed and are negative.     Allergies  Review of patient's allergies indicates no known allergies.  Home Medications   Prior to Admission medications   Medication Sig Start Date End Date Taking? Authorizing Provider  albuterol (PROVENTIL) (2.5 MG/3ML) 0.083% nebulizer solution Take 3 mLs (2.5 mg total) by nebulization 4 (four) times daily as needed for wheezing or shortness of breath. 09/21/13   Noralee Space, MD  ALPRAZolam Duanne Moron) 0.5 MG tablet Take 0.25-0.5 mg by mouth 4 (four) times daily as needed for anxiety.    Historical Provider, MD  aspirin EC 81 MG tablet Take 81 mg by mouth daily after supper.     Historical Provider, MD  benzonatate (TESSALON) 100 MG capsule Take 1 capsule (100 mg total) by mouth 3 (three) times daily as needed for cough. 10/26/13   Domenic Polite, MD  Calcium Carbonate-Vitamin D (CALCIUM-VITAMIN D) 500-200 MG-UNIT per tablet Take 1 tablet by mouth 2 (two) times daily with a meal.    Historical Provider, MD  clotrimazole (MYCELEX) 10 MG troche Take 1 tablet (10 mg total) by mouth 5 (five) times daily. X 7 days 10/27/13   Fonnie Mu Parrett, NP  docusate sodium (COLACE) 100 MG capsule Take 2 capsules (200 mg total) by mouth daily as needed for mild constipation. 10/26/13   Domenic Polite, MD  ferrous sulfate 325 (65 FE) MG tablet Take 325 mg by mouth every evening.    Historical Provider, MD  Fluticasone-Salmeterol (ADVAIR DISKUS) 250-50 MCG/DOSE AEPB Inhale 1 puff into the lungs 2 (two) times daily. 09/21/13   Noralee Space, MD  furosemide (LASIX) 40 MG tablet Take 40 mg by mouth 2 (two) times daily.     Historical Provider, MD  gabapentin (NEURONTIN) 600 MG tablet Take 0.5 tablets (300 mg total) by mouth 3  (three) times daily. 10/26/13   Domenic Polite, MD  guaiFENesin (MUCINEX) 600 MG 12 hr tablet Take 1,200 mg by mouth 2 (two) times daily.    Historical Provider, MD  isosorbide mononitrate (IMDUR) 30 MG 24 hr tablet Take 1 tablet (30 mg total) by mouth at bedtime. 01/16/13   Lorretta Harp, MD  lenalidomide (REVLIMID) 10 MG capsule Take 1 capsule (10 mg total) by mouth daily. Take 1 capsule ( 10 mg) by mouth daily for 21 days every 4 weeks 11/03/13   Curt Bears, MD  metoprolol (LOPRESSOR) 50 MG tablet Take 1 tablet (50 mg  total) by mouth 2 (two) times daily. 09/07/13   Verlee Monte, MD  Multiple Vitamin (MULTIVITAMIN WITH MINERALS) TABS Take 1 tablet by mouth every morning.     Historical Provider, MD  nitroGLYCERIN (NITROSTAT) 0.4 MG SL tablet Place 0.4 mg under the tongue every 5 (five) minutes as needed for chest pain.    Historical Provider, MD  nortriptyline (PAMELOR) 50 MG capsule Take 1 capsule (50 mg total) by mouth at bedtime. 09/21/13   Noralee Space, MD  omeprazole (PRILOSEC) 20 MG capsule Take 20 mg by mouth 2 (two) times daily.    Historical Provider, MD  ondansetron (ZOFRAN) 4 MG tablet Take 4 mg by mouth every 8 (eight) hours as needed for nausea or vomiting.    Historical Provider, MD  oxyCODONE-acetaminophen (PERCOCET/ROXICET) 5-325 MG per tablet Take 2 tablets by mouth every 4 (four) hours as needed for pain.    Historical Provider, MD  potassium chloride SA (K-DUR,KLOR-CON) 20 MEQ tablet Take 20 mEq by mouth 2 (two) times daily.    Historical Provider, MD  simvastatin (ZOCOR) 20 MG tablet Take 20 mg by mouth at bedtime.    Historical Provider, MD  tiZANidine (ZANAFLEX) 4 MG tablet Take 4 mg by mouth 3 (three) times daily.  08/26/13   Historical Provider, MD  warfarin (COUMADIN) 5 MG tablet Take 2.5-5 mg by mouth every morning. She takes half a tablet on Monday, Wednesday, Friday and one tablet on Tuesday, Thursday, Saturday and Sunday.    Historical Provider, MD   BP 94/73  Pulse 89   Temp(Src) 97.6 F (36.4 C) (Oral)  Resp 20  SpO2 100% Physical Exam  Nursing note and vitals reviewed. Constitutional: She is oriented to person, place, and time. She appears well-developed.  Elderly, frail  HENT:  Head: Normocephalic and atraumatic.  Eyes: Conjunctivae and EOM are normal. Pupils are equal, round, and reactive to light.  Neck: Normal range of motion and phonation normal. Neck supple.  Cardiovascular: Normal rate, regular rhythm and intact distal pulses.   Pulmonary/Chest: Effort normal and breath sounds normal. No respiratory distress. She exhibits tenderness (Localized tenderness and swelling, consistent with ecchymosis from a subacute injury, left lower chest wall, this area is mildly tender , and is without crepitation.).  Abdominal: Soft. She exhibits no distension. There is no tenderness. There is no guarding.  Musculoskeletal: Normal range of motion.  Neurological: She is alert and oriented to person, place, and time. No cranial nerve deficit. She exhibits normal muscle tone.  Skin: Skin is warm and dry.  Psychiatric: She has a normal mood and affect. Her behavior is normal.    ED Course  Procedures (including critical care time)  Medications  0.9 %  sodium chloride infusion ( Intravenous Stopped 11/08/13 0929)  0.9 %  sodium chloride infusion (not administered)  acetaminophen (TYLENOL) tablet 650 mg (650 mg Oral Given 11/08/13 0645)  sodium chloride 0.9 % bolus 1,000 mL (1,000 mLs Intravenous New Bag/Given 11/08/13 0917)    Patient Vitals for the past 24 hrs:  BP Temp Temp src Pulse Resp SpO2  11/08/13 1308 94/73 mmHg - - - - -  11/08/13 1303 95/47 mmHg - - 89 - -  11/08/13 1301 89/56 mmHg - - 79 - -  11/08/13 1258 72/36 mmHg - - 78 - -  11/08/13 1230 73/55 mmHg - - 133 20 100 %  11/08/13 1217 88/47 mmHg - - - - -  11/08/13 1215 88/47 mmHg - - 77 20 100 %  11/08/13 1200 86/63 mmHg - - 74 21 100 %  11/08/13 1145 111/62 mmHg - - 69 15 100 %  11/08/13  1130 98/35 mmHg - - 63 17 100 %  11/08/13 1123 90/37 mmHg 97.6 F (36.4 C) Oral 64 14 100 %  11/08/13 1100 91/38 mmHg - - - 14 -  11/08/13 1045 104/35 mmHg - - - 14 -  11/08/13 1030 94/49 mmHg - - - 13 -  11/08/13 1015 90/40 mmHg - - - 14 -  11/08/13 1000 98/41 mmHg - - 69 14 100 %  11/08/13 0945 87/40 mmHg - - 75 15 100 %  11/08/13 0930 87/57 mmHg - - 72 16 100 %  11/08/13 0919 80/47 mmHg - - 76 18 100 %  11/08/13 0900 95/42 mmHg - - 89 15 99 %  11/08/13 0856 79/39 mmHg 98.5 F (36.9 C) Oral - - -  11/08/13 0855 82/42 mmHg - - - 17 100 %  11/08/13 0642 119/64 mmHg 101.7 F (38.7 C) Rectal - 15 97 %  11/08/13 0625 89/50 mmHg 98.9 F (37.2 C) Oral 121 20 91 %    9:11 AM  Reevaluation with update and discussion. After initial assessment and treatment, an updated evaluation reveals IVF bolus and drip ordered for persistant  hypotension. Richarda Blade   12:26 PM Reevaluation with update and discussion. After initial assessment and treatment, an updated evaluation reveals she feels better, she has been able to eat and drink some, she is persistently low blood pressure. Comparison with recent baseline from earlier in the month reveals that this is a significant change, from a normotensive state. Richarda Blade   13:10- orthostatic blood pressure, pulses and taken in are negative for orthostasis. She is asymptomatic, with standing. She has persistent hypotension, on all 3 measurements. Findings discussed with patient and husband, all questions answered. Nursing is scheduling a followup appointment with her PCP, in 48 hours.  CRITICAL CARE Performed by: Richarda Blade Total critical care time: 45 minutes Critical care time was exclusive of separately billable procedures and treating other patients. Critical care was necessary to treat or prevent imminent or life-threatening deterioration. Critical care was time spent personally by me on the following activities: development of treatment  plan with patient and/or surrogate as well as nursing, discussions with consultants, evaluation of patient's response to treatment, examination of patient, obtaining history from patient or surrogate, ordering and performing treatments and interventions, ordering and review of laboratory studies, ordering and review of radiographic studies, pulse oximetry and re-evaluation of patient's condition.  Labs Review Labs Reviewed  CBC - Abnormal; Notable for the following:    RBC 3.03 (*)    Hemoglobin 9.4 (*)    HCT 29.6 (*)    RDW 21.7 (*)    Platelets 74 (*)    All other components within normal limits  COMPREHENSIVE METABOLIC PANEL - Abnormal; Notable for the following:    Glucose, Bld 102 (*)    Creatinine, Ser 1.49 (*)    Albumin 3.0 (*)    Alkaline Phosphatase 143 (*)    GFR calc non Af Amer 34 (*)    GFR calc Af Amer 39 (*)    All other components within normal limits  PROTIME-INR - Abnormal; Notable for the following:    Prothrombin Time 24.6 (*)    INR 2.31 (*)    All other components within normal limits  DIFFERENTIAL - Abnormal; Notable for the following:    Neutrophils Relative %  4 (*)    Lymphocytes Relative 53 (*)    Monocytes Relative 0 (*)    nRBC 3 (*)    Neutro Abs 1.1 (*)    Monocytes Absolute 0.0 (*)    All other components within normal limits  URINALYSIS, ROUTINE W REFLEX MICROSCOPIC  PATHOLOGIST SMEAR REVIEW  CBG MONITORING, ED  I-STAT CG4 LACTIC ACID, ED    Imaging Review Dg Chest Portable 1 View  11/08/2013   CLINICAL DATA:  Fever.  Cough.  History of heart disease.  EXAM: PORTABLE CHEST - 1 VIEW  COMPARISON:  DG CHEST 2 VIEW dated 11/06/2013; CT CHEST W/CM dated 08/31/2013; DG CHEST 2 VIEW dated 07/25/2013  FINDINGS: Prior CABG. Mild cardiomegaly. Indistinct pulmonary vasculature. Emphysema. No overt edema.  IMPRESSION: 1. Cardiomegaly with mild indistinctness of the pulmonary vasculature which could reflect pulmonary venous hypertension. No overt edema. 2.  Emphysema.   Electronically Signed   By: Sherryl Barters M.D.   On: 11/08/2013 07:26     EKG Interpretation None      MDM   Final diagnoses:  Fever  Hypotension  Fluid volume depletion   Fever without localizing symptoms or abnormal findings on evaluation. Persistently low blood pressure with unchanged renal function. She does not appear clinically dehydrated. Doubt serious bacterial infection, or metabolic instability. She has not responded to IV fluid resuscitation. She is asymptomatic from her low blood pressure. She may need further assessment, for adrenal insufficiency as an outpatient. More likely, her low blood pressure is related to medication therapy/polypharmacy.   Nursing Notes Reviewed/ Care Coordinated Applicable Imaging Reviewed Interpretation of Laboratory Data incorporated into ED treatment  The patient appears reasonably screened and/or stabilized for discharge and I doubt any other medical condition or other [facility] requiring further screening, evaluation, or treatment in the ED at this time prior to discharge.  Plan: Home Medications- Tylenol prn, Hold Lasix and Imdur until recheck; Home Treatments- rest, encourage good diet and fluid intaks.; return here if the recommended treatment, does not improve the symptoms; Recommended follow up- PCP in 2 days as scheduled   Richarda Blade, MD 11/08/13 1746

## 2013-11-10 ENCOUNTER — Inpatient Hospital Stay: Payer: Medicare Other | Admitting: Adult Health

## 2013-11-10 ENCOUNTER — Ambulatory Visit (INDEPENDENT_AMBULATORY_CARE_PROVIDER_SITE_OTHER): Payer: Medicare Other | Admitting: Pharmacist Clinician (PhC)/ Clinical Pharmacy Specialist

## 2013-11-10 LAB — POCT INR: INR: 5.1

## 2013-11-11 ENCOUNTER — Encounter (HOSPITAL_COMMUNITY): Payer: Self-pay | Admitting: Radiology

## 2013-11-11 ENCOUNTER — Emergency Department (HOSPITAL_COMMUNITY): Payer: Medicare Other

## 2013-11-11 ENCOUNTER — Other Ambulatory Visit: Payer: Self-pay

## 2013-11-11 ENCOUNTER — Inpatient Hospital Stay (HOSPITAL_COMMUNITY)
Admission: EM | Admit: 2013-11-11 | Discharge: 2013-11-17 | DRG: 871 | Disposition: A | Discharge status: Home Health | Payer: Medicare Other | Attending: Internal Medicine | Admitting: Internal Medicine

## 2013-11-11 DIAGNOSIS — J4489 Other specified chronic obstructive pulmonary disease: Secondary | ICD-10-CM | POA: Diagnosis present

## 2013-11-11 DIAGNOSIS — I5032 Chronic diastolic (congestive) heart failure: Secondary | ICD-10-CM | POA: Diagnosis present

## 2013-11-11 DIAGNOSIS — N183 Chronic kidney disease, stage 3 unspecified: Secondary | ICD-10-CM | POA: Diagnosis present

## 2013-11-11 DIAGNOSIS — E8809 Other disorders of plasma-protein metabolism, not elsewhere classified: Secondary | ICD-10-CM | POA: Diagnosis present

## 2013-11-11 DIAGNOSIS — I4891 Unspecified atrial fibrillation: Secondary | ICD-10-CM | POA: Diagnosis present

## 2013-11-11 DIAGNOSIS — Z8249 Family history of ischemic heart disease and other diseases of the circulatory system: Secondary | ICD-10-CM

## 2013-11-11 DIAGNOSIS — G9340 Encephalopathy, unspecified: Secondary | ICD-10-CM | POA: Diagnosis present

## 2013-11-11 DIAGNOSIS — A419 Sepsis, unspecified organism: Principal | ICD-10-CM | POA: Diagnosis present

## 2013-11-11 DIAGNOSIS — G9341 Metabolic encephalopathy: Secondary | ICD-10-CM | POA: Diagnosis present

## 2013-11-11 DIAGNOSIS — Z87891 Personal history of nicotine dependence: Secondary | ICD-10-CM

## 2013-11-11 DIAGNOSIS — J189 Pneumonia, unspecified organism: Secondary | ICD-10-CM | POA: Diagnosis present

## 2013-11-11 DIAGNOSIS — R509 Fever, unspecified: Secondary | ICD-10-CM

## 2013-11-11 DIAGNOSIS — D696 Thrombocytopenia, unspecified: Secondary | ICD-10-CM | POA: Diagnosis present

## 2013-11-11 DIAGNOSIS — J9601 Acute respiratory failure with hypoxia: Secondary | ICD-10-CM

## 2013-11-11 DIAGNOSIS — Z96659 Presence of unspecified artificial knee joint: Secondary | ICD-10-CM

## 2013-11-11 DIAGNOSIS — R6510 Systemic inflammatory response syndrome (SIRS) of non-infectious origin without acute organ dysfunction: Secondary | ICD-10-CM | POA: Diagnosis present

## 2013-11-11 DIAGNOSIS — E785 Hyperlipidemia, unspecified: Secondary | ICD-10-CM | POA: Diagnosis present

## 2013-11-11 DIAGNOSIS — J9600 Acute respiratory failure, unspecified whether with hypoxia or hypercapnia: Secondary | ICD-10-CM

## 2013-11-11 DIAGNOSIS — IMO0001 Reserved for inherently not codable concepts without codable children: Secondary | ICD-10-CM | POA: Diagnosis present

## 2013-11-11 DIAGNOSIS — Z951 Presence of aortocoronary bypass graft: Secondary | ICD-10-CM

## 2013-11-11 DIAGNOSIS — D649 Anemia, unspecified: Secondary | ICD-10-CM

## 2013-11-11 DIAGNOSIS — R791 Abnormal coagulation profile: Secondary | ICD-10-CM | POA: Diagnosis present

## 2013-11-11 DIAGNOSIS — I252 Old myocardial infarction: Secondary | ICD-10-CM

## 2013-11-11 DIAGNOSIS — D46C Myelodysplastic syndrome with isolated del(5q) chromosomal abnormality: Secondary | ICD-10-CM | POA: Diagnosis present

## 2013-11-11 DIAGNOSIS — Z79899 Other long term (current) drug therapy: Secondary | ICD-10-CM

## 2013-11-11 DIAGNOSIS — D638 Anemia in other chronic diseases classified elsewhere: Secondary | ICD-10-CM | POA: Diagnosis present

## 2013-11-11 DIAGNOSIS — J449 Chronic obstructive pulmonary disease, unspecified: Secondary | ICD-10-CM | POA: Diagnosis present

## 2013-11-11 DIAGNOSIS — Z7982 Long term (current) use of aspirin: Secondary | ICD-10-CM

## 2013-11-11 DIAGNOSIS — R4182 Altered mental status, unspecified: Secondary | ICD-10-CM

## 2013-11-11 DIAGNOSIS — Z7901 Long term (current) use of anticoagulants: Secondary | ICD-10-CM

## 2013-11-11 DIAGNOSIS — IMO0002 Reserved for concepts with insufficient information to code with codable children: Secondary | ICD-10-CM

## 2013-11-11 DIAGNOSIS — K219 Gastro-esophageal reflux disease without esophagitis: Secondary | ICD-10-CM | POA: Diagnosis present

## 2013-11-11 DIAGNOSIS — E860 Dehydration: Secondary | ICD-10-CM

## 2013-11-11 DIAGNOSIS — I2510 Atherosclerotic heart disease of native coronary artery without angina pectoris: Secondary | ICD-10-CM | POA: Diagnosis present

## 2013-11-11 LAB — CBC WITH DIFFERENTIAL/PLATELET
Basophils Absolute: 0.2 10*3/uL — ABNORMAL HIGH (ref 0.0–0.1)
Basophils Relative: 4 % — ABNORMAL HIGH (ref 0–1)
EOS PCT: 0 % (ref 0–5)
Eosinophils Absolute: 0 10*3/uL (ref 0.0–0.7)
HCT: 26.2 % — ABNORMAL LOW (ref 36.0–46.0)
HEMOGLOBIN: 8.4 g/dL — AB (ref 12.0–15.0)
LYMPHS ABS: 2.9 10*3/uL (ref 0.7–4.0)
Lymphocytes Relative: 59 % — ABNORMAL HIGH (ref 12–46)
MCH: 31.1 pg (ref 26.0–34.0)
MCHC: 32.1 g/dL (ref 30.0–36.0)
MCV: 97 fL (ref 78.0–100.0)
Monocytes Absolute: 0.9 10*3/uL (ref 0.1–1.0)
Monocytes Relative: 17 % — ABNORMAL HIGH (ref 3–12)
Neutro Abs: 1 10*3/uL — ABNORMAL LOW (ref 1.7–7.7)
Neutrophils Relative %: 20 % — ABNORMAL LOW (ref 43–77)
Platelets: 65 10*3/uL — ABNORMAL LOW (ref 150–400)
RBC: 2.7 MIL/uL — AB (ref 3.87–5.11)
RDW: 21.6 % — ABNORMAL HIGH (ref 11.5–15.5)
WBC: 5 10*3/uL (ref 4.0–10.5)

## 2013-11-11 LAB — COMPREHENSIVE METABOLIC PANEL
ALT: 16 U/L (ref 0–35)
AST: 37 U/L (ref 0–37)
Albumin: 2.9 g/dL — ABNORMAL LOW (ref 3.5–5.2)
Alkaline Phosphatase: 148 U/L — ABNORMAL HIGH (ref 39–117)
BILIRUBIN TOTAL: 0.9 mg/dL (ref 0.3–1.2)
BUN: 17 mg/dL (ref 6–23)
CO2: 23 meq/L (ref 19–32)
Calcium: 8.7 mg/dL (ref 8.4–10.5)
Chloride: 100 mEq/L (ref 96–112)
Creatinine, Ser: 1.34 mg/dL — ABNORMAL HIGH (ref 0.50–1.10)
GFR calc Af Amer: 45 mL/min — ABNORMAL LOW (ref >90)
GFR calc non Af Amer: 39 mL/min — ABNORMAL LOW (ref >90)
GLUCOSE: 102 mg/dL — AB (ref 70–99)
POTASSIUM: 4.6 meq/L (ref 3.7–5.3)
Sodium: 137 mEq/L (ref 137–147)
Total Protein: 7.1 g/dL (ref 6.0–8.3)

## 2013-11-11 LAB — TROPONIN I

## 2013-11-11 LAB — I-STAT CG4 LACTIC ACID, ED: LACTIC ACID, VENOUS: 1.45 mmol/L (ref 0.5–2.2)

## 2013-11-11 LAB — PROTIME-INR
INR: 3.42 — AB (ref 0.00–1.49)
Prothrombin Time: 33.2 seconds — ABNORMAL HIGH (ref 11.6–15.2)

## 2013-11-11 LAB — URINALYSIS, ROUTINE W REFLEX MICROSCOPIC
BILIRUBIN URINE: NEGATIVE
Glucose, UA: NEGATIVE mg/dL
HGB URINE DIPSTICK: NEGATIVE
Ketones, ur: NEGATIVE mg/dL
Leukocytes, UA: NEGATIVE
NITRITE: NEGATIVE
PROTEIN: NEGATIVE mg/dL
Specific Gravity, Urine: 1.009 (ref 1.005–1.030)
UROBILINOGEN UA: 4 mg/dL — AB (ref 0.0–1.0)
pH: 6.5 (ref 5.0–8.0)

## 2013-11-11 LAB — INFLUENZA PANEL BY PCR (TYPE A & B)
H1N1 flu by pcr: NOT DETECTED
Influenza A By PCR: NEGATIVE
Influenza B By PCR: NEGATIVE

## 2013-11-11 LAB — MRSA PCR SCREENING: MRSA by PCR: NEGATIVE

## 2013-11-11 LAB — RAPID STREP SCREEN (MED CTR MEBANE ONLY): STREPTOCOCCUS, GROUP A SCREEN (DIRECT): NEGATIVE

## 2013-11-11 MED ORDER — MORPHINE SULFATE 2 MG/ML IJ SOLN
1.0000 mg | Freq: Once | INTRAMUSCULAR | Status: AC
  Administered 2013-11-11: 1 mg via INTRAVENOUS
  Filled 2013-11-11: qty 1

## 2013-11-11 MED ORDER — POTASSIUM CHLORIDE CRYS ER 20 MEQ PO TBCR
20.0000 meq | EXTENDED_RELEASE_TABLET | Freq: Two times a day (BID) | ORAL | Status: DC
  Administered 2013-11-11 – 2013-11-17 (×13): 20 meq via ORAL
  Filled 2013-11-11 (×14): qty 1

## 2013-11-11 MED ORDER — WARFARIN - PHARMACIST DOSING INPATIENT: Freq: Every day | Status: DC

## 2013-11-11 MED ORDER — DEXTROSE 5 % IV SOLN
2.0000 g | Freq: Once | INTRAVENOUS | Status: AC
  Administered 2013-11-11: 2 g via INTRAVENOUS

## 2013-11-11 MED ORDER — SODIUM CHLORIDE 0.9 % IV SOLN
INTRAVENOUS | Status: DC
  Administered 2013-11-11: 22:00:00 via INTRAVENOUS

## 2013-11-11 MED ORDER — SODIUM CHLORIDE 0.9 % IV SOLN
1000.0000 mL | Freq: Once | INTRAVENOUS | Status: AC
  Administered 2013-11-11: 1000 mL via INTRAVENOUS

## 2013-11-11 MED ORDER — SIMVASTATIN 20 MG PO TABS
20.0000 mg | ORAL_TABLET | Freq: Every day | ORAL | Status: DC
  Administered 2013-11-11 – 2013-11-16 (×6): 20 mg via ORAL
  Filled 2013-11-11 (×7): qty 1

## 2013-11-11 MED ORDER — FERROUS SULFATE 325 (65 FE) MG PO TABS
325.0000 mg | ORAL_TABLET | Freq: Every evening | ORAL | Status: DC
  Administered 2013-11-11 – 2013-11-16 (×6): 325 mg via ORAL
  Filled 2013-11-11 (×7): qty 1

## 2013-11-11 MED ORDER — METOPROLOL TARTRATE 50 MG PO TABS
50.0000 mg | ORAL_TABLET | Freq: Two times a day (BID) | ORAL | Status: DC
  Administered 2013-11-11 – 2013-11-12 (×4): 50 mg via ORAL
  Filled 2013-11-11 (×6): qty 1

## 2013-11-11 MED ORDER — PIPERACILLIN-TAZOBACTAM 3.375 G IVPB
3.3750 g | Freq: Three times a day (TID) | INTRAVENOUS | Status: DC
  Administered 2013-11-11 – 2013-11-17 (×18): 3.375 g via INTRAVENOUS
  Filled 2013-11-11 (×20): qty 50

## 2013-11-11 MED ORDER — MOMETASONE FURO-FORMOTEROL FUM 100-5 MCG/ACT IN AERO
2.0000 | INHALATION_SPRAY | Freq: Two times a day (BID) | RESPIRATORY_TRACT | Status: DC
  Administered 2013-11-11 – 2013-11-17 (×13): 2 via RESPIRATORY_TRACT
  Filled 2013-11-11: qty 8.8

## 2013-11-11 MED ORDER — DEXTROSE 5 % IV SOLN: 2.0000 g | INTRAVENOUS | Status: DC

## 2013-11-11 MED ORDER — ONDANSETRON HCL 4 MG PO TABS: 4.0000 mg | ORAL_TABLET | Freq: Four times a day (QID) | ORAL | Status: DC | PRN

## 2013-11-11 MED ORDER — VANCOMYCIN HCL IN DEXTROSE 750-5 MG/150ML-% IV SOLN
750.0000 mg | Freq: Two times a day (BID) | INTRAVENOUS | Status: DC
  Filled 2013-11-11: qty 150

## 2013-11-11 MED ORDER — ACETAMINOPHEN 650 MG RE SUPP: 650.0000 mg | Freq: Four times a day (QID) | RECTAL | Status: DC | PRN

## 2013-11-11 MED ORDER — BENZONATATE 100 MG PO CAPS
100.0000 mg | ORAL_CAPSULE | Freq: Three times a day (TID) | ORAL | Status: DC | PRN
  Administered 2013-11-12 – 2013-11-15 (×2): 100 mg via ORAL
  Filled 2013-11-11 (×3): qty 1

## 2013-11-11 MED ORDER — GABAPENTIN 300 MG PO CAPS
600.0000 mg | ORAL_CAPSULE | Freq: Three times a day (TID) | ORAL | Status: DC
  Administered 2013-11-11 – 2013-11-17 (×19): 600 mg via ORAL
  Filled 2013-11-11 (×21): qty 2

## 2013-11-11 MED ORDER — TRAMADOL HCL 50 MG PO TABS
100.0000 mg | ORAL_TABLET | Freq: Four times a day (QID) | ORAL | Status: DC | PRN
  Administered 2013-11-11 – 2013-11-13 (×4): 100 mg via ORAL
  Filled 2013-11-11 (×4): qty 2

## 2013-11-11 MED ORDER — ONDANSETRON HCL 4 MG/2ML IJ SOLN: 4.0000 mg | Freq: Three times a day (TID) | INTRAMUSCULAR | Status: AC | PRN

## 2013-11-11 MED ORDER — VANCOMYCIN HCL 10 G IV SOLR
1250.0000 mg | INTRAVENOUS | Status: DC
  Administered 2013-11-11 – 2013-11-12 (×2): 1250 mg via INTRAVENOUS
  Filled 2013-11-11 (×3): qty 1250

## 2013-11-11 MED ORDER — SODIUM CHLORIDE 0.9 % IV BOLUS (SEPSIS)
250.0000 mL | Freq: Once | INTRAVENOUS | Status: AC
  Administered 2013-11-11: 250 mL via INTRAVENOUS

## 2013-11-11 MED ORDER — SODIUM CHLORIDE 0.9 % IV SOLN: 1000.0000 mL | Freq: Once | INTRAVENOUS | Status: DC

## 2013-11-11 MED ORDER — SODIUM CHLORIDE 0.9 % IJ SOLN
3.0000 mL | Freq: Two times a day (BID) | INTRAMUSCULAR | Status: DC
  Administered 2013-11-11 – 2013-11-16 (×8): 3 mL via INTRAVENOUS

## 2013-11-11 MED ORDER — ALUM & MAG HYDROXIDE-SIMETH 200-200-20 MG/5ML PO SUSP
30.0000 mL | Freq: Four times a day (QID) | ORAL | Status: DC | PRN
  Administered 2013-11-11: 30 mL via ORAL
  Filled 2013-11-11: qty 30

## 2013-11-11 MED ORDER — SODIUM CHLORIDE 0.9 % IV SOLN: 1000.0000 mL | INTRAVENOUS | Status: DC

## 2013-11-11 MED ORDER — DOCUSATE SODIUM 100 MG PO CAPS
200.0000 mg | ORAL_CAPSULE | Freq: Every day | ORAL | Status: DC | PRN
  Filled 2013-11-11: qty 2

## 2013-11-11 MED ORDER — ACETAMINOPHEN 325 MG PO TABS
650.0000 mg | ORAL_TABLET | Freq: Four times a day (QID) | ORAL | Status: DC | PRN
  Administered 2013-11-12 – 2013-11-17 (×9): 650 mg via ORAL
  Filled 2013-11-11 (×9): qty 2

## 2013-11-11 MED ORDER — VANCOMYCIN HCL IN DEXTROSE 1-5 GM/200ML-% IV SOLN
1000.0000 mg | Freq: Once | INTRAVENOUS | Status: AC
  Administered 2013-11-11: 1000 mg via INTRAVENOUS
  Filled 2013-11-11: qty 200

## 2013-11-11 MED ORDER — PANTOPRAZOLE SODIUM 40 MG PO TBEC
40.0000 mg | DELAYED_RELEASE_TABLET | Freq: Every day | ORAL | Status: DC
  Administered 2013-11-11 – 2013-11-17 (×7): 40 mg via ORAL
  Filled 2013-11-11 (×8): qty 1

## 2013-11-11 MED ORDER — ISOSORBIDE MONONITRATE ER 30 MG PO TB24
30.0000 mg | ORAL_TABLET | Freq: Every day | ORAL | Status: DC
  Administered 2013-11-11: 30 mg via ORAL
  Filled 2013-11-11 (×2): qty 1

## 2013-11-11 MED ORDER — ASPIRIN EC 81 MG PO TBEC
81.0000 mg | DELAYED_RELEASE_TABLET | Freq: Every day | ORAL | Status: DC
  Administered 2013-11-11 – 2013-11-16 (×6): 81 mg via ORAL
  Filled 2013-11-11 (×7): qty 1

## 2013-11-11 MED ORDER — ONDANSETRON HCL 4 MG/2ML IJ SOLN: 4.0000 mg | Freq: Four times a day (QID) | INTRAMUSCULAR | Status: DC | PRN

## 2013-11-11 MED ORDER — ALPRAZOLAM 0.25 MG PO TABS
0.2500 mg | ORAL_TABLET | Freq: Three times a day (TID) | ORAL | Status: DC
  Administered 2013-11-11: 0.5 mg via ORAL
  Administered 2013-11-11 – 2013-11-12 (×4): 0.25 mg via ORAL
  Administered 2013-11-12 – 2013-11-13 (×3): 0.5 mg via ORAL
  Administered 2013-11-13: 0.25 mg via ORAL
  Administered 2013-11-14: 0.5 mg via ORAL
  Administered 2013-11-14 – 2013-11-15 (×2): 0.25 mg via ORAL
  Filled 2013-11-11: qty 1
  Filled 2013-11-11: qty 2
  Filled 2013-11-11 (×3): qty 1
  Filled 2013-11-11: qty 2
  Filled 2013-11-11 (×3): qty 1
  Filled 2013-11-11 (×3): qty 2
  Filled 2013-11-11: qty 1

## 2013-11-11 MED ORDER — ACETAMINOPHEN 650 MG RE SUPP
650.0000 mg | Freq: Once | RECTAL | Status: AC
  Administered 2013-11-11: 650 mg via RECTAL
  Filled 2013-11-11: qty 1

## 2013-11-11 MED ORDER — FUROSEMIDE 40 MG PO TABS
40.0000 mg | ORAL_TABLET | Freq: Two times a day (BID) | ORAL | Status: DC
  Administered 2013-11-11 – 2013-11-12 (×3): 40 mg via ORAL
  Filled 2013-11-11 (×5): qty 1

## 2013-11-11 MED ORDER — GUAIFENESIN ER 600 MG PO TB12
1200.0000 mg | ORAL_TABLET | Freq: Two times a day (BID) | ORAL | Status: DC
  Administered 2013-11-11 – 2013-11-17 (×13): 1200 mg via ORAL
  Filled 2013-11-11 (×14): qty 2

## 2013-11-11 MED ORDER — TIZANIDINE HCL 4 MG PO TABS
4.0000 mg | ORAL_TABLET | Freq: Three times a day (TID) | ORAL | Status: DC | PRN
  Administered 2013-11-12: 4 mg via ORAL
  Filled 2013-11-11: qty 1

## 2013-11-11 MED ORDER — NORTRIPTYLINE HCL 25 MG PO CAPS
50.0000 mg | ORAL_CAPSULE | Freq: Every day | ORAL | Status: DC
  Administered 2013-11-11 – 2013-11-16 (×6): 50 mg via ORAL
  Filled 2013-11-11 (×7): qty 2

## 2013-11-11 MED ORDER — NITROGLYCERIN 0.4 MG SL SUBL
0.4000 mg | SUBLINGUAL_TABLET | SUBLINGUAL | Status: DC | PRN
  Administered 2013-11-11 (×3): 0.4 mg via SUBLINGUAL
  Filled 2013-11-11 (×2): qty 1

## 2013-11-11 NOTE — ED Provider Notes (Signed)
CSN: [id number]     Arrival date & time 11/11/13  0546 History   First MD Initiated Contact with Patient 11/11/13 [phone]     No chief complaint on file.    (Consider location/radiation/quality/duration/timing/severity/associated sxs/prior Treatment) HPI Comments: 73 year old female with lipids, COPD, anemia, COPD, atrial fibrillation, Coumadin, stroke, myelodysplastic syndrome, sepsis, recent admission for pneumonia and confusion presents with worsening confusion and fever. Patient has had gradually worsening confusion for the past 24 hours developed a fever this morning. Initially it started after patient is taking narcotics for pain however the fever started today. No focal weakness. No head injury.  The history is provided by the patient and the spouse.    Past Medical History  Diagnosis Date  . Coronary atherosclerosis of unspecified type of vessel, native or graft   . Atrial fibrillation 01/19/2012    stress test - non-gated secondary to A fib,  normal myocaridal perfusion study  . Unspecified venous (peripheral) insufficiency   . Other and unspecified hyperlipidemia   . Morbid obesity   . Esophageal reflux   . Irritable bowel syndrome   . Other symptoms involving urinary system(788.99)   . Chronic pain syndrome   . 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 Anxiety state, unspecified   . Herpes zoster without mention of complication   . Herpes zoster with other nervous system complications(053.19)   . Lumbago   . Anticoagulated on Coumadin, chronic for A. Fib 04/08/2012  . COPD (chronic obstructive pulmonary disease)   . 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 Pneumonia 4 years ago  . Bronchitis, chronic   . Fibromyalgia   . Arthritis   . CAD (coronary artery disease) 04/29/2006    echo -EF 45-50%, impaired LV relaxation  . 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 GI bleed 04/07/2012   endoscopy/colonoscopy unremarkable  . 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 DJD (degenerative joint disease)   . Myocardial infarction   . Anemia    Past Surgical History  Procedure Laterality Date  . Vesicovaginal fistula closure w/ tah    . Cardiac catheterization  01/06/2010    RCA mid artery stenosis at 99%, LAD proximal occlusion 90%  at origin of ramus  . Nissen fundoplication    . Esophagogastroduodenoscopy  04/09/2012    Procedure: ESOPHAGOGASTRODUODENOSCOPY (EGD);  Surgeon: Beryle Beams, MD;  Location: [facility] ENDOSCOPY;  Service: Endoscopy;  Laterality: N/A;  tentatively scheduled per Trula Slade due to patients breathing problems  . Colonoscopy  04/11/2012    Procedure: COLONOSCOPY;  Surgeon: Ladene Artist, MD,FACG;  [facility];  Service: Endoscopy;  Laterality: N/A;  . Abdominal hysterectomy  at 73 years old  . Appendectomy  about 50 years ago  . Coronary artery bypass graft  1997    LIMA to LAD, SVG to ramus, SVG to RCA  . Cholecystectomy  50 years ago  . Neck surgery  73 years old    full movement  . Right knee arthroscopy  3 years ago  . Eye surgery  2005    cataracts both eyes  . Back surgery      x5, "birdcage" and fused in lower back  . Total knee arthroplasty  06/28/2012    Procedure: TOTAL KNEE ARTHROPLASTY;  Surgeon: Sydnee Cabal, MD;  Location: [facility] ORS;  Service: Orthopedics;  Laterality: Left;  . Joint replacement Left 06/28/12    knee replacement  Family History  Problem Relation Age of Onset  . Heart disease Mother   . Lung disease Father   . Kidney disease     History  Substance Use Topics  . Smoking status: Former Smoker -- 1.00 packs/day for 22 years    Types: Cigarettes    Quit date: 07/20/1984  . Smokeless tobacco: Never Used  . Alcohol Use: No   OB History   Grav Para Term Preterm Abortions TAB SAB Ect Mult Living                 Review of Systems  Constitutional: Positive for fever. Negative for chills.  HENT: Negative for  congestion.   Eyes: Negative for visual disturbance.  Respiratory: Negative for shortness of breath.   Cardiovascular: Negative for chest pain.  Gastrointestinal: Negative for vomiting and abdominal pain.  Genitourinary: Negative for dysuria and flank pain.  Musculoskeletal: Negative for back pain, neck pain and neck stiffness.  Skin: Negative for rash.  Neurological: Positive for headaches. Negative for light-headedness.      Allergies  Review of patient's allergies indicates no known allergies.  Home Medications   Prior to Admission medications   Medication Sig Start Date End Date Taking? Authorizing Provider  albuterol (PROVENTIL) (2.5 MG/3ML) 0.083% nebulizer solution Take 3 mLs (2.5 mg total) by nebulization 4 (four) times daily as needed for wheezing or shortness of breath. 09/21/13  Yes Noralee Space, MD  ALPRAZolam Duanne Moron) 0.5 MG tablet Take 0.25-0.5 mg by mouth 4 (four) times daily as needed for anxiety.   Yes Historical Provider, MD  aspirin EC 81 MG tablet Take 81 mg by mouth daily after supper.    Yes Historical Provider, MD  benzonatate (TESSALON) 100 MG capsule Take 1 capsule (100 mg total) by mouth 3 (three) times daily as needed for cough. 10/26/13  Yes Domenic Polite, MD  Calcium Carbonate-Vitamin D (CALCIUM-VITAMIN D) 500-200 MG-UNIT per tablet Take 1 tablet by mouth 2 (two) times daily with a meal.   Yes Historical Provider, MD  docusate sodium (COLACE) 100 MG capsule Take 200 mg by mouth daily as needed for mild constipation.   Yes Historical Provider, MD  ferrous sulfate 325 (65 FE) MG tablet Take 325 mg by mouth every evening.   Yes Historical Provider, MD  Fluticasone-Salmeterol (ADVAIR DISKUS) 250-50 MCG/DOSE AEPB Inhale 1 puff into the lungs 2 (two) times daily. 09/21/13  Yes Noralee Space, MD  furosemide (LASIX) 40 MG tablet Take 40 mg by mouth 2 (two) times daily.    Yes Historical Provider, MD  gabapentin (NEURONTIN) 600 MG tablet Take 600 mg by mouth 3 (three)  times daily. 10/26/13  Yes Domenic Polite, MD  guaiFENesin (MUCINEX) 600 MG 12 hr tablet Take 1,200 mg by mouth 2 (two) times daily.   Yes Historical Provider, MD  isosorbide mononitrate (IMDUR) 30 MG 24 hr tablet Take 1 tablet (30 mg total) by mouth at bedtime. 01/16/13  Yes Lorretta Harp, MD  lenalidomide (REVLIMID) 10 MG capsule Take 1 capsule (10 mg total) by mouth daily. Take 1 capsule ( 10 mg) by mouth daily for 21 days every 4 weeks 11/03/13  Yes Curt Bears, MD  metoprolol (LOPRESSOR) 50 MG tablet Take 1 tablet (50 mg total) by mouth 2 (two) times daily. 09/07/13  Yes Verlee Monte, MD  Multiple Vitamin (MULTIVITAMIN WITH MINERALS) TABS Take 1 tablet by mouth every morning.    Yes Historical Provider, MD  nortriptyline (PAMELOR) 50 MG capsule Take 1  capsule (50 mg total) by mouth at bedtime. 09/21/13  Yes Noralee Space, MD  omeprazole (PRILOSEC) 20 MG capsule Take 20 mg by mouth 2 (two) times daily.   Yes Historical Provider, MD  ondansetron (ZOFRAN) 4 MG tablet Take 4 mg by mouth every 8 (eight) hours as needed for nausea or vomiting.   Yes Historical Provider, MD  oxyCODONE-acetaminophen (PERCOCET/ROXICET) 5-325 MG per tablet Take 2 tablets by mouth every 4 (four) hours as needed for pain.   Yes Historical Provider, MD  potassium chloride SA (K-DUR,KLOR-CON) 20 MEQ tablet Take 20 mEq by mouth 2 (two) times daily.   Yes Historical Provider, MD  simvastatin (ZOCOR) 20 MG tablet Take 20 mg by mouth at bedtime.   Yes Historical Provider, MD  tiZANidine (ZANAFLEX) 4 MG tablet Take 4 mg by mouth 3 (three) times daily.  08/26/13  Yes Historical Provider, MD  warfarin (COUMADIN) 5 MG tablet Take 2.5-5 mg by mouth every morning. She takes half a tablet on Monday, Wednesday, Friday and one tablet on Tuesday, Thursday, Saturday and Sunday.   Yes Historical Provider, MD  nitroGLYCERIN (NITROSTAT) 0.4 MG SL tablet Place 0.4 mg under the tongue every 5 (five) minutes as needed for chest pain.    Historical  Provider, MD   BP 142/100  Pulse 138  Temp(Src) 102.5 F (39.2 C) (Oral)  Resp 36  Ht 5' 6.93" (1.7 m)  Wt 201 lb 1 oz (91.2 kg)  BMI 31.56 kg/m2  SpO2 88% Physical Exam  Nursing note and vitals reviewed. Constitutional: She appears well-developed and well-nourished.  HENT:  Head: Normocephalic and atraumatic.  Dry mucous membranes  Eyes: Conjunctivae are normal. Right eye exhibits no discharge. Left eye exhibits no discharge.  Neck: Normal range of motion. Neck supple. No tracheal deviation present.  Cardiovascular: An irregularly irregular rhythm present. Tachycardia present.   Pulmonary/Chest: Effort normal and breath sounds normal.  Abdominal: Soft. She exhibits no distension. There is no tenderness. There is no guarding.  Musculoskeletal: She exhibits no edema.  Neurological: She is alert. A cranial nerve deficit is present. GCS eye subscore is 4. GCS verbal subscore is 4. GCS motor subscore is 6.  5+ strength in UE and LE with f/e at major joints. Sensation to palpation intact in UE and LE.   EOMFI.  PERRL.   Finger nose and coordination intact bilateral.   Patient is not no date of birth or location. General confusion.   Skin: Skin is warm. No rash noted.  Psychiatric: She has a normal mood and affect.    ED Course  Procedures (including critical care time) Labs Review Labs Reviewed  CBC WITH DIFFERENTIAL - Abnormal; Notable for the following:    RBC 2.70 (*)    Hemoglobin 8.4 (*)    HCT 26.2 (*)    RDW 21.6 (*)    Platelets 65 (*)    Neutrophils Relative % 20 (*)    Lymphocytes Relative 59 (*)    Monocytes Relative 17 (*)    Basophils Relative 4 (*)    Neutro Abs 1.0 (*)    Basophils Absolute 0.2 (*)    All other components within normal limits  COMPREHENSIVE METABOLIC PANEL - Abnormal; Notable for the following:    Glucose, Bld 102 (*)    Creatinine, Ser 1.34 (*)    Albumin 2.9 (*)    Alkaline Phosphatase 148 (*)    GFR calc non Af Amer 39 (*)    GFR  calc Af Wyvonnia Lora  45 (*)    All other components within normal limits  PROTIME-INR - Abnormal; Notable for the following:    Prothrombin Time 33.2 (*)    INR 3.42 (*)    All other components within normal limits  CULTURE, BLOOD (ROUTINE X 2)  CULTURE, BLOOD (ROUTINE X 2)  URINE CULTURE  URINALYSIS, ROUTINE W REFLEX MICROSCOPIC  I-STAT CG4 LACTIC ACID, ED    Imaging Review Dg Chest Port 1 View  11/11/2013   CLINICAL DATA:  Fever, cough, atrial fibrillation  EXAM: PORTABLE CHEST - 1 VIEW  COMPARISON:  DG CHEST 1V PORT dated 11/08/2013; DG CHEST 2 VIEW dated 11/06/2013; DG CHEST 1V PORT dated 10/22/2013  FINDINGS: Mild cardiac enlargement status post CABG. Vascular pattern is normal. There is no consolidation, effusion, or pneumothorax.  IMPRESSION: No active disease.   Electronically Signed   By: Skipper Cliche M.D.   On: 11/11/2013 07:03     EKG Interpretation None      Date: 11/11/2013  Rate: 109  Rhythm: a fib rvr  QRS Axis: normal  Intervals: normal  ST/T Wave abnormalities: non spec ST changes  Conduction Disutrbances:none  Narrative Interpretation:     MDM   Final diagnoses:  Altered mental state  Sepsis  Atrial fibrillation with RVR  Acute encephalopathy  Dehydration  Anemia   Concern for sepsis with fever, tachycardia, tachypnea, recent admission and confusion. Patient does not have meningismus on exam. Broad-spectrum antibiotics, blood cultures and blood work ordered. Patient requiring 2 L nasal cannula without respiratory distress.  The patients results and plan were reviewed and discussed.   Any x-rays performed were personally reviewed by myself.   Differential diagnosis were considered with the presenting HPI.   On recheck patient has mild persistent confusion. Her rates improved to 100 and blood pressure is 545G systolic. Continued normal saline infusion. Discussed with triad hospitalist for a mission to telemetry. UA pending. EKG: a fib rvr  Filed  Vitals:   11/11/13 0555 11/11/13 0658  BP: 142/100   Pulse: 138   Temp: 102.5 F (39.2 C)   TempSrc: Oral   Resp: 36   Height:  5' 6.93" (1.7 m)  Weight:  201 lb 1 oz (91.2 kg)  SpO2: 88%     Admission/ observation were discussed with the admitting physician, patient and/or family and they are comfortable with the plan.      Mariea Clonts, MD 11/11/13 [phone]

## 2013-11-11 NOTE — H&P (Addendum)
[name] History and Physical  [name] [id number] DOB: 1940/09/19 DOA: 11/11/2013  Referring physician: Elnora Morrison PCP: Noralee Space, MD  Specialists: Dr Julien Nordmann  Chief Complaint: confusion and fever  HPI: [name] is a 73 y.o. female with PMH as below who is brought in by her husband for confusion which started about 1 hr after being given percocet and lasted at 1 PM through 7 PM yesterday and but then the confusion improved. She had pain again this morning and her husband gave her 1 percocet at 4 am. By 5 am she became confused again. He decided to bring her to the ER. Here, she is found to be febrile at 102. 5. CXR and UA are negative for infection. She has a mild chronic cough from COPD. She is on Revlimid for MDS which was started 2 days ago.   General: The patient denies anorexia, fever, weight loss, has a severely sore throat Cardiac: Denies chest pain, syncope, palpitations, pedal edema  Respiratory: Denies dyspnea on exertion, cough, shortness of breath, wheezing  GI: Denies severe  indigestion/heartburn, abdominal pain, nausea, vomiting, diarrhea  GU: Denies hematuria, incontinence, dysuria  Musculoskeletal: --has sciatica with shooting pain down the right leg Skin: Denies suspicious skin lesions Neurologic: Denies focal weakness or numbness, change in vision-   Past Medical History  Diagnosis Date  . Coronary atherosclerosis of unspecified type of vessel, native or graft   . Atrial fibrillation 01/19/2012    stress test - non-gated secondary to A fib,  normal myocaridal perfusion study  . Unspecified venous (peripheral) insufficiency   . Other and unspecified hyperlipidemia   . Morbid obesity   . Esophageal reflux   . Irritable bowel syndrome   . Other symptoms involving urinary system(788.99)   . Chronic pain syndrome   . 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 Anxiety state, unspecified   . Herpes zoster without mention of  complication   . Herpes zoster with other nervous system complications(053.19)   . Lumbago   . Anticoagulated on Coumadin, chronic for A. Fib 04/08/2012  . COPD (chronic obstructive pulmonary disease)   . 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 Pneumonia 4 years ago  . Bronchitis, chronic   . Fibromyalgia   . Arthritis   . CAD (coronary artery disease) 04/29/2006    echo -EF 45-50%, impaired LV relaxation  . 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 GI bleed 04/07/2012    endoscopy/colonoscopy unremarkable  . 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 DJD (degenerative joint disease)   . Myocardial infarction   . Anemia    Past Surgical History  Procedure Laterality Date  . Vesicovaginal fistula closure w/ tah    . Cardiac catheterization  01/06/2010    RCA mid artery stenosis at 99%, LAD proximal occlusion 90%  at origin of ramus  . Nissen fundoplication    . Esophagogastroduodenoscopy  04/09/2012    Procedure: ESOPHAGOGASTRODUODENOSCOPY (EGD);  Surgeon: Beryle Beams, MD;  Location: [facility] ENDOSCOPY;  Service: Endoscopy;  Laterality: N/A;  tentatively scheduled per Trula Slade due to patients breathing problems  . Colonoscopy  04/11/2012    Procedure: COLONOSCOPY;  Surgeon: Ladene Artist, MD,FACG;  Location: [facility] ENDOSCOPY;  Service: Endoscopy;  Laterality: N/A;  . Abdominal hysterectomy  at 73 years old  . Appendectomy  about 50 years ago  . Coronary artery bypass graft  1997  LIMA to LAD, SVG to ramus, SVG to RCA  . Cholecystectomy  50 years ago  . Neck surgery  73 years old    full movement  . Right knee arthroscopy  3 years ago  . Eye surgery  2005    cataracts both eyes  . Back surgery      x5, "birdcage" and fused in lower back  . Total knee arthroplasty  06/28/2012    Procedure: TOTAL KNEE ARTHROPLASTY;  Surgeon: Sydnee Cabal, MD;  Location: [facility] ORS;  Service: Orthopedics;  Laterality: Left;  . Joint  replacement Left 06/28/12    knee replacement   Social History:  reports that she quit smoking about 29 years ago. Her smoking use included Cigarettes. She has a 22 pack-year smoking history. She has never used smokeless tobacco. She reports that she does not drink alcohol or use illicit drugs. Lives at home with husband  good with ADLs  No Known Allergies  Family History  Problem Relation Age of Onset  . Heart disease Mother   . Lung disease Father   . Kidney disease       Prior to Admission medications   Medication Sig Start Date End Date Taking? Authorizing Provider  albuterol (PROVENTIL) (2.5 MG/3ML) 0.083% nebulizer solution Take 3 mLs (2.5 mg total) by nebulization 4 (four) times daily as needed for wheezing or shortness of breath. 09/21/13  Yes Noralee Space, MD  ALPRAZolam Duanne Moron) 0.5 MG tablet Take 0.25-0.5 mg by mouth 4 (four) times daily as needed for anxiety.   Yes Historical Provider, MD  aspirin EC 81 MG tablet Take 81 mg by mouth daily after supper.    Yes Historical Provider, MD  benzonatate (TESSALON) 100 MG capsule Take 1 capsule (100 mg total) by mouth 3 (three) times daily as needed for cough. 10/26/13  Yes Domenic Polite, MD  Calcium Carbonate-Vitamin D (CALCIUM-VITAMIN D) 500-200 MG-UNIT per tablet Take 1 tablet by mouth 2 (two) times daily with a meal.   Yes Historical Provider, MD  docusate sodium (COLACE) 100 MG capsule Take 200 mg by mouth daily as needed for mild constipation.   Yes Historical Provider, MD  ferrous sulfate 325 (65 FE) MG tablet Take 325 mg by mouth every evening.   Yes Historical Provider, MD  Fluticasone-Salmeterol (ADVAIR DISKUS) 250-50 MCG/DOSE AEPB Inhale 1 puff into the lungs 2 (two) times daily. 09/21/13  Yes Noralee Space, MD  furosemide (LASIX) 40 MG tablet Take 40 mg by mouth 2 (two) times daily.    Yes Historical Provider, MD  gabapentin (NEURONTIN) 600 MG tablet Take 600 mg by mouth 3 (three) times daily. 10/26/13  Yes Domenic Polite, MD   guaiFENesin (MUCINEX) 600 MG 12 hr tablet Take 1,200 mg by mouth 2 (two) times daily.   Yes Historical Provider, MD  isosorbide mononitrate (IMDUR) 30 MG 24 hr tablet Take 1 tablet (30 mg total) by mouth at bedtime. 01/16/13  Yes Lorretta Harp, MD  lenalidomide (REVLIMID) 10 MG capsule Take 1 capsule (10 mg total) by mouth daily. Take 1 capsule ( 10 mg) by mouth daily for 21 days every 4 weeks 11/03/13  Yes Curt Bears, MD  metoprolol (LOPRESSOR) 50 MG tablet Take 1 tablet (50 mg total) by mouth 2 (two) times daily. 09/07/13  Yes Verlee Monte, MD  Multiple Vitamin (MULTIVITAMIN WITH MINERALS) TABS Take 1 tablet by mouth every morning.    Yes Historical Provider, MD  nortriptyline (PAMELOR) 50 MG capsule Take 1 capsule (50  mg total) by mouth at bedtime. 09/21/13  Yes Noralee Space, MD  omeprazole (PRILOSEC) 20 MG capsule Take 20 mg by mouth 2 (two) times daily.   Yes Historical Provider, MD  ondansetron (ZOFRAN) 4 MG tablet Take 4 mg by mouth every 8 (eight) hours as needed for nausea or vomiting.   Yes Historical Provider, MD  oxyCODONE-acetaminophen (PERCOCET/ROXICET) 5-325 MG per tablet Take 2 tablets by mouth every 4 (four) hours as needed for pain.   Yes Historical Provider, MD  potassium chloride SA (K-DUR,KLOR-CON) 20 MEQ tablet Take 20 mEq by mouth 2 (two) times daily.   Yes Historical Provider, MD  simvastatin (ZOCOR) 20 MG tablet Take 20 mg by mouth at bedtime.   Yes Historical Provider, MD  tiZANidine (ZANAFLEX) 4 MG tablet Take 4 mg by mouth 3 (three) times daily.  08/26/13  Yes Historical Provider, MD  warfarin (COUMADIN) 5 MG tablet Take 2.5-5 mg by mouth every morning. She takes half a tablet on Monday, Wednesday, Friday and one tablet on Tuesday, Thursday, Saturday and Sunday.   Yes Historical Provider, MD  nitroGLYCERIN (NITROSTAT) 0.4 MG SL tablet Place 0.4 mg under the tongue every 5 (five) minutes as needed for chest pain.    Historical Provider, MD     Physical Exam: Filed  Vitals:   11/11/13 0831  BP: 100/54  Pulse: 103  Temp: 98.2 F (36.8 C)  Resp: 19    General: AAO x 3, no distress able to give me a reasonable history HEENT: Normocephalic and Atraumatic, Mucous membranes pink--                 PERRLA; EOM intact; No scleral icterus,                 Nares: Patent, Oropharynx: Clear, Fair Dentition                 Neck: FROM, no cervical lymphadenopathy, thyromegaly, carotid bruit or JVD;  Breasts: deferred CHEST WALL: No tenderness  CHEST: Normal respiration, clear to auscultation bilaterally  HEART: Regular rate and rhythm; no murmurs rubs or gallops  BACK: No kyphosis or scoliosis; no CVA tenderness  ABDOMEN: Positive Bowel Sounds, soft, non-tender; no masses, no organomegaly Rectal Exam: deferred EXTREMITIES: No cyanosis, clubbing, or edema Genitalia: not examined  SKIN:  no rash or ulceration  CNS: Alert and Oriented x 4, Nonfocal exam, CN 2-12 intact  Labs on Admission:  Basic Metabolic Panel:  Recent Labs Lab 11/08/13 0640 11/11/13 0614  NA 138 137  K 4.4 4.6  CL 99 100  CO2 24 23  GLUCOSE 102* 102*  BUN 21 17  CREATININE 1.49* 1.34*  CALCIUM 9.0 8.7   Liver Function Tests:  Recent Labs Lab 11/08/13 0640 11/11/13 0614  AST 33 37  ALT 13 16  ALKPHOS 143* 148*  BILITOT 0.8 0.9  PROT 7.3 7.1  ALBUMIN 3.0* 2.9*   No results found for this basename: LIPASE, AMYLASE,  in the last 168 hours No results found for this basename: AMMONIA,  in the last 168 hours CBC:  Recent Labs Lab 11/08/13 0640 11/11/13 0614  WBC 5.6 5.0  NEUTROABS 1.1* 1.0*  HGB 9.4* 8.4*  HCT 29.6* 26.2*  MCV 97.7 97.0  PLT 74* 65*   Cardiac Enzymes: No results found for this basename: CKTOTAL, CKMB, CKMBINDEX, TROPONINI,  in the last 168 hours  BNP (last 3 results)  Recent Labs  09/03/13 1500 09/21/13 1125 10/22/13 0844  PROBNP 14053.0* 203.0*  5985.0*   CBG:  Recent Labs Lab 11/08/13 0643  GLUCAP 99    Radiological Exams on  Admission: Ct Head Wo Contrast  11/11/2013   CLINICAL DATA:  Altered mental status, headache, on blood thinner  EXAM: CT HEAD WITHOUT CONTRAST  TECHNIQUE: Contiguous axial images were obtained from the base of the skull through the vertex without intravenous contrast.  COMPARISON:  Prior CT head 10/18/2012  FINDINGS: Negative for acute intracranial hemorrhage, acute infarction, mass, mass effect, hydrocephalus or midline shift. Gray-white differentiation is preserved throughout. Global cerebral and cerebellar volume loss. Stable periventricular and subcortical white matter hypoattenuation most consistent with sequelae of longstanding microvascular ischemia. Symmetric and unremarkable appearance of the globes and orbits. Normal aeration of the mastoid air cells and visualized paranasal sinuses. Atherosclerotic calcifications in the bilateral cavernous and supra clinoid internal carotid arteries.  IMPRESSION: 1. No acute intracranial process. 2. Stable cerebral atrophy and chronic microvascular ischemic white matter disease. 3. Intracranial atherosclerosis.   Electronically Signed   By: Jacqulynn Cadet M.D.   On: 11/11/2013 07:23   Dg Chest Port 1 View  11/11/2013   CLINICAL DATA:  Fever, cough, atrial fibrillation  EXAM: PORTABLE CHEST - 1 VIEW  COMPARISON:  DG CHEST 1V PORT dated 11/08/2013; DG CHEST 2 VIEW dated 11/06/2013; DG CHEST 1V PORT dated 10/22/2013  FINDINGS: Mild cardiac enlargement status post CABG. Vascular pattern is normal. There is no consolidation, effusion, or pneumothorax.  IMPRESSION: No active disease.   Electronically Signed   By: Skipper Cliche M.D.   On: 11/11/2013 07:03    EKG: Independently reviewed. A-fib at 109 bpm  Assessment/Plan Principal Problem:   SIRS - fever, tachycardia, tachypnea - no source as of yet  Active Problems:    Fever- viral vs bacterial vs drug induced in immunocompromised adult - as mentioned above, no source found as of yet - she complains of a  severe sore throat therefore will check a strep swab and influenza PCR  - if above is negative, will obtain a CT of the chest to look for underlying pneumonia as she recently had a pneumonia - if above negative, f/u on blood cultures - have called on call oncology (Dr Marin Olp) to see if her Revlimid could be causing a drug fever as it was started 2 days ago. He feels that is rare and with her underlying Plasma cell dyscrasia, it would be more prudent to r/o out an infection - cont Vanc and Zosyn for now- obviously will need to d/c if no bacterial source found  Acute encephalopathy- episodic - the confusion seems to be related to ingestion of percocet. Although she continues to have a fever in there ER, the confusion appears to be improving. 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

## 2013-11-11 NOTE — Progress Notes (Signed)
PT. C/o cp midsternal radiating across chest, ekg done showed a fib, bp 143/91, hr 84,r 22, sat 100 on o2. MD texted paged with info.

## 2013-11-11 NOTE — Progress Notes (Addendum)
ANTICOAGULATION CONSULT NOTE - Initial Consult  Pharmacy Consult for warfarin Indication: atrial fibrillation  No Known Allergies  Patient Measurements: Height: 5' 6.93" (170 cm) Weight: 201 lb 1 oz (91.2 kg) IBW/kg (Calculated) : 61.44 Heparin Dosing Weight:   Vital Signs: Temp: 98.2 F (36.8 C) (04/25 0831) Temp src: Oral (04/25 0831) BP: 100/54 mmHg (04/25 0831) Pulse Rate: 103 (04/25 0831)  Labs:  Recent Labs  11/10/13 11/11/13 0614  HGB  --  8.4*  HCT  --  26.2*  PLT  --  65*  LABPROT  --  33.2*  INR 5.1 3.42*  CREATININE  --  1.34*    Estimated Creatinine Clearance: 43.9 ml/min (by C-G formula based on Cr of 1.34).   Medical History: Past Medical History  Diagnosis Date  . Coronary atherosclerosis of unspecified type of vessel, native or graft   . Atrial fibrillation 01/19/2012    stress test - non-gated secondary to A fib,  normal myocaridal perfusion study  . Unspecified venous (peripheral) insufficiency   . Other and unspecified hyperlipidemia   . Morbid obesity   . Esophageal reflux   . Irritable bowel syndrome   . Other symptoms involving urinary system(788.99)   . Chronic pain syndrome   . 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 Anxiety state, unspecified   . Herpes zoster without mention of complication   . Herpes zoster with other nervous system complications(053.19)   . Lumbago   . Anticoagulated on Coumadin, chronic for A. Fib 04/08/2012  . COPD (chronic obstructive pulmonary disease)   . 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 Pneumonia 4 years ago  . Bronchitis, chronic   . Fibromyalgia   . Arthritis   . CAD (coronary artery disease) 04/29/2006    echo -EF 45-50%, impaired LV relaxation  . 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 GI bleed 04/07/2012    endoscopy/colonoscopy unremarkable  . 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 DJD (degenerative joint disease)    . Myocardial infarction   . Anemia    Assessment: 18 yof known to pharmacy from previous admissions, on warfarin for h/o afib  Per anti-coag visit from 4/24: INR was 5.1 with plans to hold warfarin today, Sun and Mon then start warfarin 2.5mg  daily with 5mg  on SunWed  INR today = 3.54  CBC: Hgb = 8.4, plts = 65 (platelets decreased from previous admission)  Goal of Therapy:  INR 2-3 Monitor platelets by anticoagulation protocol: Yes   Plan:   No warfarin today for SUPRAtherapeutic INR  Monitor daily PT/INR  Watch platelet count  Doreene Eland, PharmD, BCPS.   Pager: [phone]  11/11/2013,9:48 AM

## 2013-11-11 NOTE — Progress Notes (Signed)
ANTIBIOTIC CONSULT NOTE - INITIAL  Pharmacy Consult for Cefepime/Vancomycin Indication: HCAP  No Known Allergies  Patient Measurements: Height: 5' 6.93" (170 cm) Weight: 201 lb 1 oz (91.2 kg) IBW/kg (Calculated) : 61.44   Vital Signs: Temp: 102.5 F (39.2 C) (04/25 0555) Temp src: Oral (04/25 0555) BP: 142/100 mmHg (04/25 0555) Pulse Rate: 138 (04/25 0555) Intake/Output from previous day:   Intake/Output from this shift:    Labs:  Recent Labs  11/11/13 0614  WBC 5.0  HGB 8.4*  PLT PENDING  CREATININE 1.34*   Estimated Creatinine Clearance: 43.9 ml/min (by C-G formula based on Cr of 1.34). No results found for this basename: VANCOTROUGH, VANCOPEAK, VANCORANDOM, GENTTROUGH, GENTPEAK, GENTRANDOM, TOBRATROUGH, TOBRAPEAK, TOBRARND, AMIKACINPEAK, AMIKACINTROU, AMIKACIN,  in the last 72 hours   Microbiology: Recent Results (from the past 720 hour(s))  CULTURE, BLOOD (ROUTINE X 2)     Status: None   Collection Time    10/22/13  8:44 AM      Result Value Ref Range Status   Specimen Description BLOOD RIGHT ANTECUBITAL   Final   Special Requests BOTTLES DRAWN AEROBIC AND ANAEROBIC 5 CC EACH   Final   Culture  Setup Time     Final   Value: 10/22/2013 15:20     Performed at [facility]   Culture     Final   Value: NO GROWTH 5 DAYS     Performed at [facility]   Report Status 10/28/2013 FINAL   Final  CULTURE, BLOOD (ROUTINE X 2)     Status: None   Collection Time    10/22/13  8:44 AM      Result Value Ref Range Status   Specimen Description BLOOD BLOOD LEFT FOREARM   Final   Special Requests BOTTLES DRAWN AEROBIC AND ANAEROBIC 4 CC [facility]   Final   Culture  Setup Time     Final   Value: 10/22/2013 15:20     Performed at [facility]   Culture     Final   Value: NO GROWTH 5 DAYS     Performed at [facility]   Report Status 10/28/2013 FINAL   Final  URINE CULTURE     Status: None   Collection Time    10/22/13 11:41 AM      Result  Value Ref Range Status   Specimen Description URINE, CLEAN CATCH   Final   Special Requests NONE   Final   Culture  Setup Time     Final   Value: 10/22/2013 19:23     Performed at [facility]     Final   Value: NO GROWTH     Performed at [facility]   Culture     Final   Value: NO GROWTH     Performed at [facility]   Report Status 10/23/2013 FINAL   Final  MRSA PCR SCREENING     Status: Abnormal   Collection Time    10/22/13 11:43 AM      Result Value Ref Range Status   MRSA by PCR POSITIVE (*) NEGATIVE Final   Comment:            The GeneXpert MRSA Assay (FDA     approved for NASAL specimens     only), is one component of a     comprehensive MRSA colonization     surveillance program. It is not     intended to  diagnose MRSA     infection nor to guide or     monitor treatment for     MRSA infections.     RESULT CALLED TO, READ BACK BY AND VERIFIED WITH:     D.VAUDREUIL AT 1504 ON [id number] BY C.BONGEL  CULTURE, EXPECTORATED SPUTUM-ASSESSMENT     Status: None   Collection Time    10/23/13 11:10 AM      Result Value Ref Range Status   Specimen Description SPUTUM   Final   Special Requests NONE   Final   Sputum evaluation     Final   Value: THIS SPECIMEN IS ACCEPTABLE. RESPIRATORY CULTURE REPORT TO FOLLOW.   Report Status 10/23/2013 FINAL   Final  CULTURE, RESPIRATORY (NON-EXPECTORATED)     Status: None   Collection Time    10/23/13 11:10 AM      Result Value Ref Range Status   Specimen Description SPUTUM   Final   Special Requests NONE   Final   Gram Stain     Final   Value: NO WBC SEEN     NO SQUAMOUS EPITHELIAL CELLS SEEN     NO ORGANISMS SEEN     Performed at [facility]   Culture     Final   Value: NORMAL OROPHARYNGEAL FLORA     Performed at [facility]   Report Status 10/25/2013 FINAL   Final  TECHNOLOGIST REVIEW     Status: None   Collection Time    11/02/13 10:33 AM      Result Value Ref Range  Status   Technologist Review 2% blast, rare nrbc, Metas and Myelocytes present   Final    Medical History: Past Medical History  Diagnosis Date  . Coronary atherosclerosis of unspecified type of vessel, native or graft   . Atrial fibrillation 01/19/2012    stress test - non-gated secondary to A fib,  normal myocaridal perfusion study  . Unspecified venous (peripheral) insufficiency   . Other and unspecified hyperlipidemia   . Morbid obesity   . Esophageal reflux   . Irritable bowel syndrome   . Other symptoms involving urinary system(788.99)   . Chronic pain syndrome   . 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 Anxiety state, unspecified   . Herpes zoster without mention of complication   . Herpes zoster with other nervous system complications(053.19)   . Lumbago   . Anticoagulated on Coumadin, chronic for A. Fib 04/08/2012  . COPD (chronic obstructive pulmonary disease)   . 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 Pneumonia 4 years ago  . Bronchitis, chronic   . Fibromyalgia   . Arthritis   . CAD (coronary artery disease) 04/29/2006    echo -EF 45-50%, impaired LV relaxation  . 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 GI bleed 04/07/2012    endoscopy/colonoscopy unremarkable  . 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 DJD (degenerative joint disease)   . Myocardial infarction   . Anemia     Medications:  Scheduled:  . vancomycin  750 mg Intravenous Q12H   Infusions:  . [START ON 11/12/2013] ceFEPime (MAXIPIME) IV    . vancomycin 1,000 mg (11/11/13 0703)   Assessment: 73 yo w/ hx of HNT, CAD, s/p CABG, A-fib on chronic warfarin, COPD, chronic iron deficiency anemia, GERD, CKD stage III and recent bone marrow biopsy for thrombocytopenia. Recent hospital admission.  Vancomycin and Cefepime  for HCAP.  Goal of Therapy:  Vancomycin trough level 15-20 mcg/ml  Plan:   Cefepime 2Gm IV  q24h  Vancomycin 1Gm x1 then $Remov'750mg'KOcLbR$  IV q12h  F/U SCr/levels/cultures as needed  Dorrene German 11/11/2013,7:06 AM

## 2013-11-11 NOTE — ED Notes (Signed)
Report called to [facility].  Patient transported on monitor.

## 2013-11-11 NOTE — ED Notes (Signed)
Patient and spouse report she has had a sore throat for several days.

## 2013-11-11 NOTE — Progress Notes (Signed)
ANTIBIOTIC CONSULT NOTE - FOLLOW UP   Pharmacy Consult for Vancomycin, Cefepime to Zosyn Indication: HCAP  No Known Allergies  Patient Measurements: Height: 5' 6.93" (170 cm) Weight: 201 lb 1 oz (91.2 kg) IBW/kg (Calculated) : 61.44 Adjusted Body Weight:   Vital Signs: Temp: 98.2 F (36.8 C) (04/25 0831) Temp src: Oral (04/25 0831) BP: 100/54 mmHg (04/25 0831) Pulse Rate: 103 (04/25 0831) Intake/Output from previous day:   Intake/Output from this shift:    Labs:  Recent Labs  11/11/13 0614  WBC 5.0  HGB 8.4*  PLT 65*  CREATININE 1.34*   Estimated Creatinine Clearance: 43.9 ml/min (by C-G formula based on Cr of 1.34). No results found for this basename: VANCOTROUGH, VANCOPEAK, VANCORANDOM, GENTTROUGH, GENTPEAK, GENTRANDOM, TOBRATROUGH, TOBRAPEAK, TOBRARND, AMIKACINPEAK, AMIKACINTROU, AMIKACIN,  in the last 72 hours   Microbiology: Recent Results (from the past 720 hour(s))  CULTURE, BLOOD (ROUTINE X 2)     Status: None   Collection Time    10/22/13  8:44 AM      Result Value Ref Range Status   Specimen Description BLOOD RIGHT ANTECUBITAL   Final   Special Requests BOTTLES DRAWN AEROBIC AND ANAEROBIC 5 CC EACH   Final   Culture  Setup Time     Final   Value: 10/22/2013 15:20     Performed at [facility]   Culture     Final   Value: NO GROWTH 5 DAYS     Performed at [facility]   Report Status 10/28/2013 FINAL   Final  CULTURE, BLOOD (ROUTINE X 2)     Status: None   Collection Time    10/22/13  8:44 AM      Result Value Ref Range Status   Specimen Description BLOOD BLOOD LEFT FOREARM   Final   Special Requests BOTTLES DRAWN AEROBIC AND ANAEROBIC 4 CC [facility]   Final   Culture  Setup Time     Final   Value: 10/22/2013 15:20     Performed at [facility]   Culture     Final   Value: NO GROWTH 5 DAYS     Performed at [facility]   Report Status 10/28/2013 FINAL   Final  URINE CULTURE     Status: None   Collection Time     10/22/13 11:41 AM      Result Value Ref Range Status   Specimen Description URINE, CLEAN CATCH   Final   Special Requests NONE   Final   Culture  Setup Time     Final   Value: 10/22/2013 19:23     Performed at [facility]     Final   Value: NO GROWTH     Performed at [facility]   Culture     Final   Value: NO GROWTH     Performed at [facility]   Report Status 10/23/2013 FINAL   Final  MRSA PCR SCREENING     Status: Abnormal   Collection Time    10/22/13 11:43 AM      Result Value Ref Range Status   MRSA by PCR POSITIVE (*) NEGATIVE Final   Comment:            The GeneXpert MRSA Assay (FDA     approved for NASAL specimens     only), is one component of a     comprehensive MRSA colonization     surveillance program. It  is not     intended to diagnose MRSA     infection nor to guide or     monitor treatment for     MRSA infections.     RESULT CALLED TO, READ BACK BY AND VERIFIED WITH:     D.VAUDREUIL AT 1504 ON [id number] BY C.BONGEL  CULTURE, EXPECTORATED SPUTUM-ASSESSMENT     Status: None   Collection Time    10/23/13 11:10 AM      Result Value Ref Range Status   Specimen Description SPUTUM   Final   Special Requests NONE   Final   Sputum evaluation     Final   Value: THIS SPECIMEN IS ACCEPTABLE. RESPIRATORY CULTURE REPORT TO FOLLOW.   Report Status 10/23/2013 FINAL   Final  CULTURE, RESPIRATORY (NON-EXPECTORATED)     Status: None   Collection Time    10/23/13 11:10 AM      Result Value Ref Range Status   Specimen Description SPUTUM   Final   Special Requests NONE   Final   Gram Stain     Final   Value: NO WBC SEEN     NO SQUAMOUS EPITHELIAL CELLS SEEN     NO ORGANISMS SEEN     Performed at [facility]   Culture     Final   Value: NORMAL OROPHARYNGEAL FLORA     Performed at [facility]   Report Status 10/25/2013 FINAL   Final  TECHNOLOGIST REVIEW     Status: None   Collection Time    11/02/13 10:33 AM       Result Value Ref Range Status   Technologist Review 2% blast, rare nrbc, Metas and Myelocytes present   Final    Anti-infectives   Start     Dose/Rate Route Frequency Ordered Stop   11/12/13 0600  ceFEPIme (MAXIPIME) 2 g in dextrose 5 % 50 mL IVPB     2 g 100 mL/hr over 30 Minutes Intravenous Every 24 hours 11/11/13 0702     11/11/13 2000  vancomycin (VANCOCIN) IVPB 750 mg/150 ml premix     750 mg 150 mL/hr over 60 Minutes Intravenous Every 12 hours 11/11/13 0705     11/11/13 0615  ceFEPIme (MAXIPIME) 2 g in dextrose 5 % 50 mL IVPB     2 g 100 mL/hr over 30 Minutes Intravenous  Once 11/11/13 0610 11/11/13 0703   11/11/13 0615  vancomycin (VANCOCIN) IVPB 1000 mg/200 mL premix     1,000 mg 200 mL/hr over 60 Minutes Intravenous  Once 11/11/13 0610 11/11/13 1308      Assessment: 34 YOF recently discharged from [facility], presents with confusion and fever.  Vancomycin and cefepime initially ordered, change to vancomycin and zosyn  4/8 vancomycin trough = 27.3 mcg/ml on 750mg  IV q12h (steady state level)  Please see previous note from this morning by Delle Reining, PharmD  Goal of Therapy:  Vancomycin trough level 10-15 mcg/ml Dose zosyn per renal function  Plan:   Zosyn 3.375gm IV q8h over 4h infusion  Based on previous trough last admission with similar Scr, change vancomycin to 1250mg  IV q24h  Check levels at steady state as indicated   Doreene Eland, PharmD, BCPS.   Pager: [phone]  11/11/2013,11:14 AM

## 2013-11-11 NOTE — ED Notes (Signed)
Lactic CG4 given to Dr. Reather Converse

## 2013-11-11 NOTE — Progress Notes (Signed)
PT. Still feeling no better , no response from MD, text paged again.

## 2013-11-12 ENCOUNTER — Inpatient Hospital Stay (HOSPITAL_COMMUNITY): Payer: Medicare Other

## 2013-11-12 DIAGNOSIS — R4182 Altered mental status, unspecified: Secondary | ICD-10-CM

## 2013-11-12 DIAGNOSIS — I4891 Unspecified atrial fibrillation: Secondary | ICD-10-CM

## 2013-11-12 DIAGNOSIS — A419 Sepsis, unspecified organism: Principal | ICD-10-CM

## 2013-11-12 DIAGNOSIS — J189 Pneumonia, unspecified organism: Secondary | ICD-10-CM

## 2013-11-12 LAB — PROTIME-INR
INR: 3.23 — ABNORMAL HIGH (ref 0.00–1.49)
PROTHROMBIN TIME: 31.8 s — AB (ref 11.6–15.2)

## 2013-11-12 LAB — URINE CULTURE: Colony Count: 4000

## 2013-11-12 LAB — BASIC METABOLIC PANEL
BUN: 16 mg/dL (ref 6–23)
CALCIUM: 7.6 mg/dL — AB (ref 8.4–10.5)
CO2: 25 mEq/L (ref 19–32)
Chloride: 103 mEq/L (ref 96–112)
Creatinine, Ser: 1.26 mg/dL — ABNORMAL HIGH (ref 0.50–1.10)
GFR calc Af Amer: 48 mL/min — ABNORMAL LOW (ref >90)
GFR calc non Af Amer: 42 mL/min — ABNORMAL LOW (ref >90)
GLUCOSE: 90 mg/dL (ref 70–99)
Potassium: 3.9 mEq/L (ref 3.7–5.3)
Sodium: 141 mEq/L (ref 137–147)

## 2013-11-12 LAB — TROPONIN I
Troponin I: <0.3 ng/mL (ref <0.30)
Troponin I: <0.3 ng/mL (ref <0.30)

## 2013-11-12 MED ORDER — SODIUM CHLORIDE 0.9 % IV SOLN
INTRAVENOUS | Status: DC
  Administered 2013-11-12: via INTRAVENOUS

## 2013-11-12 MED ORDER — MENTHOL 3 MG MT LOZG
1.0000 | LOZENGE | OROMUCOSAL | Status: DC | PRN
  Filled 2013-11-12: qty 9

## 2013-11-12 MED ORDER — SODIUM CHLORIDE 0.9 % IV SOLN
INTRAVENOUS | Status: DC
  Administered 2013-11-12: 04:00:00 via INTRAVENOUS

## 2013-11-12 NOTE — Progress Notes (Signed)
Temp elevated again this am-101.5--Tylenol given & Craige Cotta also given for congested cough. Enc coughing & Deep breathing ex. BP remains low also, & Iv fld's turned down to 10cc/hr to give with IV atbx, since 12 hrs of IV fld completed at 125cc rate as previously ordered.

## 2013-11-12 NOTE — Progress Notes (Signed)
[name] PROGRESS NOTE  [name] PIR:[id number] DOB: 1941/07/02 DOA: 11/11/2013 PCP: Noralee Space, MD  Assessment/Plan: 1. Sepsis -due to respiratory source -continue Vanc/Zosyn, check Sputum Cx, flu negative, FU Blood CX -repeat 2 view CXR today -continue IVF, stop lasix/imdur, BP low last pm -will check todays labs  2. Metabolic encephalopathy -due to 1 -improved  3. COPD (chronic obstructive pulmonary disease)  - no exacerbation - continue nebs PRN   4. MDS (myelodysplastic syndrome) with 5q deletion  Plasma cell dyscrasia  - Dr Marin Olp recommends holding Revlimid for now   5. A-fib with RVR  - rate improved with hydration  - cont home metorpolol dose  - coumadin per pharmacy   6. Elevated INR  - coumadin on hold per pharmacy  DVT proph: on chronic anticoagulation  Code Status: Partial Code Family Communication: none at bedside Disposition Plan: home when improved   Antibiotics:  Vanc/Zosyn  HPI/Subjective: Coughing a lot greenish phlegm since yesterday  Objective: Filed Vitals:   11/12/13 0610  BP:   Pulse:   Temp: 98.8 F (37.1 C)  Resp:     Intake/Output Summary (Last 24 hours) at 11/12/13 0924 Last data filed at 11/12/13 0445  Gross per 24 hour  Intake   1850 ml  Output   2676 ml  Net   -826 ml   Filed Weights   11/11/13 0658 11/11/13 1245 11/12/13 0436  Weight: 91.2 kg (201 lb 1 oz) 93.5 kg (206 lb 2.1 oz) 92.8 kg (204 lb 9.4 oz)    Exam:   General: AAOx3  Cardiovascular: S1S2/RRR  Respiratory: scattered ronchi  Abdomen: soft, Nt, BS present  Musculoskeletal: no edema c/c  Data Reviewed: Basic Metabolic Panel:  Recent Labs Lab 11/08/13 0640 11/11/13 0614  NA 138 137  K 4.4 4.6  CL 99 100  CO2 24 23  GLUCOSE 102* 102*  BUN 21 17  CREATININE 1.49* 1.34*  CALCIUM 9.0 8.7   Liver Function Tests:  Recent Labs Lab 11/08/13 0640 11/11/13 0614  AST 33 37  ALT 13 16  ALKPHOS 143* 148*  BILITOT  0.8 0.9  PROT 7.3 7.1  ALBUMIN 3.0* 2.9*   No results found for this basename: LIPASE, AMYLASE,  in the last 168 hours No results found for this basename: AMMONIA,  in the last 168 hours CBC:  Recent Labs Lab 11/08/13 0640 11/11/13 0614  WBC 5.6 5.0  NEUTROABS 1.1* 1.0*  HGB 9.4* 8.4*  HCT 29.6* 26.2*  MCV 97.7 97.0  PLT 74* 65*   Cardiac Enzymes:  Recent Labs Lab 11/11/13 2122 11/12/13 0210 11/12/13 0823  TROPONINI <0.30 <0.30 <0.30   BNP (last 3 results)  Recent Labs  09/03/13 1500 09/21/13 1125 10/22/13 0844  PROBNP 14053.0* 203.0* 5985.0*   CBG:  Recent Labs Lab 11/08/13 0643  GLUCAP 99    Recent Results (from the past 240 hour(s))  TECHNOLOGIST REVIEW     Status: None   Collection Time    11/02/13 10:33 AM      Result Value Ref Range Status   Technologist Review 2% blast, rare nrbc, Metas and Myelocytes present   Final  RAPID STREP SCREEN     Status: None   Collection Time    11/11/13 11:51 AM      Result Value Ref Range Status   Streptococcus, Group A Screen (Direct) NEGATIVE  NEGATIVE Final   Comment: (NOTE)     A Rapid Antigen test may result negative if the antigen level in  the     sample is below the detection level of this test. The FDA has not     cleared this test as a stand-alone test therefore the rapid antigen     negative result has reflexed to a Group A Strep culture.  MRSA PCR SCREENING     Status: None   Collection Time    11/11/13  7:02 PM      Result Value Ref Range Status   MRSA by PCR NEGATIVE  NEGATIVE Final   Comment:            The GeneXpert MRSA Assay (FDA     approved for NASAL specimens     only), is one component of a     comprehensive MRSA colonization     surveillance program. It is not     intended to diagnose MRSA     infection nor to guide or     monitor treatment for     MRSA infections.     Studies: Ct Head Wo Contrast  11/11/2013   CLINICAL DATA:  Altered mental status, headache, on blood thinner   EXAM: CT HEAD WITHOUT CONTRAST  TECHNIQUE: Contiguous axial images were obtained from the base of the skull through the vertex without intravenous contrast.  COMPARISON:  Prior CT head 10/18/2012  FINDINGS: Negative for acute intracranial hemorrhage, acute infarction, mass, mass effect, hydrocephalus or midline shift. Gray-white differentiation is preserved throughout. Global cerebral and cerebellar volume loss. Stable periventricular and subcortical white matter hypoattenuation most consistent with sequelae of longstanding microvascular ischemia. Symmetric and unremarkable appearance of the globes and orbits. Normal aeration of the mastoid air cells and visualized paranasal sinuses. Atherosclerotic calcifications in the bilateral cavernous and supra clinoid internal carotid arteries.  IMPRESSION: 1. No acute intracranial process. 2. Stable cerebral atrophy and chronic microvascular ischemic white matter disease. 3. Intracranial atherosclerosis.   Electronically Signed   By: Jacqulynn Cadet M.D.   On: 11/11/2013 07:23   Dg Chest Port 1 View  11/11/2013   CLINICAL DATA:  Fever, cough, atrial fibrillation  EXAM: PORTABLE CHEST - 1 VIEW  COMPARISON:  DG CHEST 1V PORT dated 11/08/2013; DG CHEST 2 VIEW dated 11/06/2013; DG CHEST 1V PORT dated 10/22/2013  FINDINGS: Mild cardiac enlargement status post CABG. Vascular pattern is normal. There is no consolidation, effusion, or pneumothorax.  IMPRESSION: No active disease.   Electronically Signed   By: Skipper Cliche M.D.   On: 11/11/2013 07:03    Scheduled Meds: . sodium chloride   Intravenous STAT  . ALPRAZolam  0.25-0.5 mg Oral TID  . aspirin EC  81 mg Oral QPC supper  . ferrous sulfate  325 mg Oral QPM  . gabapentin  600 mg Oral TID  . guaiFENesin  1,200 mg Oral BID  . metoprolol  50 mg Oral BID  . mometasone-formoterol  2 puff Inhalation BID  . nortriptyline  50 mg Oral QHS  . pantoprazole  40 mg Oral Daily  . piperacillin-tazobactam (ZOSYN)  IV  3.375 g  Intravenous 3 times per day  . potassium chloride SA  20 mEq Oral BID  . simvastatin  20 mg Oral QHS  . sodium chloride  3 mL Intravenous Q12H  . vancomycin  1,250 mg Intravenous Q24H  . Warfarin - Pharmacist Dosing Inpatient   Does not apply q1800   Continuous Infusions: . sodium chloride 10 mL/hr at 11/12/13 0425  . sodium chloride     Antibiotics Given (last 72 hours)  Date/Time Action Medication Dose Rate   11/11/13 1428 Given   vancomycin (VANCOCIN) 1,250 mg in sodium chloride 0.9 % 250 mL IVPB 1,250 mg 166.7 mL/hr   11/11/13 1429 Given   piperacillin-tazobactam (ZOSYN) IVPB 3.375 g 3.375 g 12.5 mL/hr   11/11/13 2206 Given   piperacillin-tazobactam (ZOSYN) IVPB 3.375 g 3.375 g 12.5 mL/hr   11/12/13 0521 Given   piperacillin-tazobactam (ZOSYN) IVPB 3.375 g 3.375 g 12.5 mL/hr      Principal Problem:   SIRS (systemic inflammatory response syndrome) Active Problems:   COPD (chronic obstructive pulmonary disease)   CKD (chronic kidney disease), stage III   Atrial fibrillation with RVR   MDS (myelodysplastic syndrome) with 5q deletion   Plasma cell dyscrasia   Encephalopathy acute   Acute encephalopathy   Fever, unspecified    Time spent: 27min    Christianna Belmonte  [name] Pager [phone]. If 7PM-7AM, please contact night-coverage at [email or web address], password [facility] 11/12/2013, 9:24 AM  LOS: 1 day

## 2013-11-12 NOTE — Discharge Summary (Signed)
Physician Discharge Summary  [name] JME:[id number] DOB: [birth year] DOA: 10/22/2013  PCP: Noralee Space, MD  Admit date: 10/22/2013 Discharge date: 10/26/2013  Time spent: 45 minutes  Recommendations for Outpatient Follow-up:  1. PCP in 1 week 2. Repeat CXr in 4-6weeks 3. FU with Dr.Mohamed for results of Bone Marrow biopsy  Discharge Diagnoses:  Principal Problem:   Acute respiratory failure Active Problems:   HYPERLIPIDEMIA   Chronic pain syndrome   GERD (gastroesophageal reflux disease)   COPD (chronic obstructive pulmonary disease)   Anemia, chronic disease   CKD (chronic kidney disease), stage III   Atrial fibrillation with RVR   S/P CABG (coronary artery bypass graft)   Other pancytopenia   SIRS (systemic inflammatory response syndrome)   Discharge Condition: stable  Diet recommendation: low sodium  Filed Weights   10/24/13 0630 10/25/13 0500 10/26/13 0600  Weight: 95.89 kg (211 lb 6.4 oz) 93.35 kg (205 lb 12.8 oz) 91.672 kg (202 lb 1.6 oz)    History of present illness:  [name] is a 73 y.o. female  With a past medical history of hypertension, coronary artery disease, s/p CABG, atrial fibrillation on coumadin, COPD, chronic iron deficiency anemia, GERD, CKD stage III, and recent bone marrow biopsy for thrombcytopenia who presented to the ED for altered mental status. The patient's husband reports that she has been confused recently, most notably starting last night and continuing this morning. She has been confused about dates and events. The patient reports "not feeling well" since last night with some shortness of breath. She has a cough and has had chills at home. She is not on home oxygen, but has been using her nebulizer more at home. She denies any chest pain, palpitations, abdominal pain, or active signs of bleeding. Activity makes her shortness of breath worse. She was admitted for a similar presentation in February which resulted in a bone marrow  biopsy performed on 10/11/2013 for pancytopenia. She is scheduled for a follow up with Dr. Earlie Server for this on 10/24/2013. Her husband also reports a small sore under her dentures that started 1 week ago, but has been resolving.  Upon evaluation in the ED she was found to be hypotensive, febrile, tachycardic and hypoxic on room air. Her WBC was 3.6 and CXR showed cardiomegaly with vascular congestion, patchy left perihilar and retrocardiac left lower lobe airspace concerning for pneumonia. Pt has bilateral wheezes L>R. Started on Zosyn and Vancomycin. Given 1L NS and $Remo'80mg'YKTIS$  lasix in ED. Pt admitted to tele for further workup and treatment of sepsis.   Hospital Course:  Acute respiratory failure  -improving  -Multifactorial: COPD exacerbation, pneumonia  -wean off steroids, cultures negative  -changed to PO levaquin from Vanc/Piptazo   Anemia of chronic disease  - Pt s/p 1 unit of PRBC this admission - needs EPO per PCP or FU with Renal   Sepsis  -due to pneumonia, Resolving  -change Abx as noted above   COPD exacerbation  - Will continue Xopenex, Dulera, Steroids, antibiotics  - O2 weaned  HYPERLIPIDEMIA  - Stable continue home regimen   Chronic pain syndrome  - Stable, continue home regimen   GERD (gastroesophageal reflux disease)  - Stable on Protonix   CKD (chronic kidney disease), stage III  - Baseline at 1.3-1.4. Currently patient near baseline   Atrial fibrillation with RVR  - Resolved and initially was most likely exacerbated 2 to active infection  -resolved, continue coumadin and BB   S/P CABG (coronary artery bypass graft)  -  Stable, continue aspirin   Other pancytopenia  - Patient to followup with Dr.Mohamed for results of bone marrow biopsy      Discharge Exam: Filed Vitals:   10/26/13 0600  BP: 117/89  Pulse: 108  Temp: 99 F (37.2 C)  Resp: 20    General: AAOx3 Cardiovascular: S1S2/RRR Respiratory: CTAB  Discharge Instructions You were cared for  by a hospitalist during your hospital stay. If you have any questions about your discharge medications or the care you received while you were in the hospital after you are discharged, you can call the unit and asked to speak with the hospitalist on call if the hospitalist that took care of you is not available. Once you are discharged, your primary care physician will handle any further medical issues. Please note that NO REFILLS for any discharge medications will be authorized once you are discharged, as it is imperative that you return to your primary care physician (or establish a relationship with a primary care physician if you do not have one) for your aftercare needs so that they can reassess your need for medications and monitor your lab values.  Discharge Orders   Future Appointments Provider Department Dept Phone   11/16/2013 9:00 AM [facility] [phone]   11/16/2013 9:30 AM Curt Bears, MD [facility] [phone]   11/17/2013 10:30 AM Melvenia Needles, NP [facility] [phone]   01/15/2014 10:30 AM Noralee Space, MD [facility] [phone]   Future Orders Complete By Expires   Diet - low sodium heart healthy  As directed    Increase activity slowly  As directed        Medication List    STOP taking these medications       docusate sodium 100 MG capsule  Commonly known as:  COLACE     gabapentin 600 MG tablet  Commonly known as:  NEURONTIN      TAKE these medications       albuterol (2.5 MG/3ML) 0.083% nebulizer solution  Commonly known as:  PROVENTIL  Take 3 mLs (2.5 mg total) by nebulization 4 (four) times daily as needed for wheezing or shortness of breath.     ALPRAZolam 0.5 MG tablet  Commonly known as:  XANAX  Take 0.25-0.5 mg by mouth 4 (four) times daily as needed for anxiety.     aspirin EC 81 MG tablet  Take 81 mg by mouth daily after supper.      benzonatate 100 MG capsule  Commonly known as:  TESSALON  Take 1 capsule (100 mg total) by mouth 3 (three) times daily as needed for cough.     calcium-vitamin D 500-200 MG-UNIT per tablet  Take 1 tablet by mouth 2 (two) times daily with a meal.     ferrous sulfate 325 (65 FE) MG tablet  Take 325 mg by mouth every evening.     Fluticasone-Salmeterol 250-50 MCG/DOSE Aepb  Commonly known as:  ADVAIR DISKUS  Inhale 1 puff into the lungs 2 (two) times daily.     furosemide 40 MG tablet  Commonly known as:  LASIX  Take 40 mg by mouth 2 (two) times daily.     guaiFENesin 600 MG 12 hr tablet  Commonly known as:  MUCINEX  Take 1,200 mg by mouth 2 (two) times daily.     isosorbide mononitrate 30 MG 24 hr tablet  Commonly known as:  IMDUR  Take 1  tablet (30 mg total) by mouth at bedtime.     metoprolol 50 MG tablet  Commonly known as:  LOPRESSOR  Take 1 tablet (50 mg total) by mouth 2 (two) times daily.     multivitamin with minerals Tabs tablet  Take 1 tablet by mouth every morning.     nitroGLYCERIN 0.4 MG SL tablet  Commonly known as:  NITROSTAT  Place 0.4 mg under the tongue every 5 (five) minutes as needed for chest pain.     nortriptyline 50 MG capsule  Commonly known as:  PAMELOR  Take 1 capsule (50 mg total) by mouth at bedtime.     omeprazole 20 MG capsule  Commonly known as:  PRILOSEC  Take 20 mg by mouth 2 (two) times daily.     ondansetron 4 MG tablet  Commonly known as:  ZOFRAN  Take 4 mg by mouth every 8 (eight) hours as needed for nausea or vomiting.     oxyCODONE-acetaminophen 5-325 MG per tablet  Commonly known as:  PERCOCET/ROXICET  Take 2 tablets by mouth every 4 (four) hours as needed for pain.     potassium chloride SA 20 MEQ tablet  Commonly known as:  K-DUR,KLOR-CON  Take 20 mEq by mouth 2 (two) times daily.     simvastatin 20 MG tablet  Commonly known as:  ZOCOR  Take 20 mg by mouth at bedtime.     warfarin 5 MG tablet  Commonly known as:   COUMADIN  Take 2.5-5 mg by mouth every morning. She takes half a tablet on Monday, Wednesday, Friday and one tablet on Tuesday, Thursday, Saturday and Sunday.       No Known Allergies     Follow-up Information   Follow up with NADEL,SCOTT M, MD In 1 week.   Specialty:  Pulmonary Disease   Contact information:   [place] [address] [phone]        The results of significant diagnostics from this hospitalization (including imaging, microbiology, ancillary and laboratory) are listed below for reference.    Significant Diagnostic Studies: Dg Chest 2 View  11/12/2013   CLINICAL DATA:  Cough and fever  EXAM: CHEST  2 VIEW  COMPARISON:  DG CHEST 1V PORT dated 11/11/2013  FINDINGS: Sternotomy wires overlie normal cardiac silhouette. There is a left retrocardiac opacity which is subtle. Right lung base is clear. Upper lobes are clear. There is a chronic bronchitic markings.  IMPRESSION: Left lower lobe density representing atelectasis versus infiltrate.   Electronically Signed   By: Suzy Bouchard M.D.   On: 11/12/2013 11:44   Dg Chest 2 View  11/06/2013   CLINICAL DATA:  Shortness of breath.  Cough.  EXAM: CHEST  2 VIEW  COMPARISON:  DG CHEST 1V PORT dated 10/22/2013  FINDINGS: Interim near complete resolution of changes of congestive heart failure and edema noted. Stable cardiomegaly. Prior CABG. No pleural effusion or pneumothorax. Degenerative changes thoracic spine. Mild atelectasis lung bases.  IMPRESSION: Interim near complete resolution of pulmonary edema .   Electronically Signed   By: Marcello Moores  Register   On: 11/06/2013 13:55   Ct Head Wo Contrast  11/11/2013   CLINICAL DATA:  Altered mental status, headache, on blood thinner  EXAM: CT HEAD WITHOUT CONTRAST  TECHNIQUE: Contiguous axial images were obtained from the base of the skull through the vertex without intravenous contrast.  COMPARISON:  Prior CT head 10/18/2012  FINDINGS: Negative for acute intracranial  hemorrhage, acute infarction, mass, mass effect,  hydrocephalus or midline shift. Gray-white differentiation is preserved throughout. Global cerebral and cerebellar volume loss. Stable periventricular and subcortical white matter hypoattenuation most consistent with sequelae of longstanding microvascular ischemia. Symmetric and unremarkable appearance of the globes and orbits. Normal aeration of the mastoid air cells and visualized paranasal sinuses. Atherosclerotic calcifications in the bilateral cavernous and supra clinoid internal carotid arteries.  IMPRESSION: 1. No acute intracranial process. 2. Stable cerebral atrophy and chronic microvascular ischemic white matter disease. 3. Intracranial atherosclerosis.   Electronically Signed   By: Jacqulynn Cadet M.D.   On: 11/11/2013 07:23   Dg Chest Port 1 View  11/11/2013   CLINICAL DATA:  Fever, cough, atrial fibrillation  EXAM: PORTABLE CHEST - 1 VIEW  COMPARISON:  DG CHEST 1V PORT dated 11/08/2013; DG CHEST 2 VIEW dated 11/06/2013; DG CHEST 1V PORT dated 10/22/2013  FINDINGS: Mild cardiac enlargement status post CABG. Vascular pattern is normal. There is no consolidation, effusion, or pneumothorax.  IMPRESSION: No active disease.   Electronically Signed   By: Skipper Cliche M.D.   On: 11/11/2013 07:03   Dg Chest Portable 1 View  11/08/2013   CLINICAL DATA:  Fever.  Cough.  History of heart disease.  EXAM: PORTABLE CHEST - 1 VIEW  COMPARISON:  DG CHEST 2 VIEW dated 11/06/2013; CT CHEST W/CM dated 08/31/2013; DG CHEST 2 VIEW dated 07/25/2013  FINDINGS: Prior CABG. Mild cardiomegaly. Indistinct pulmonary vasculature. Emphysema. No overt edema.  IMPRESSION: 1. Cardiomegaly with mild indistinctness of the pulmonary vasculature which could reflect pulmonary venous hypertension. No overt edema. 2. Emphysema.   Electronically Signed   By: Sherryl Barters M.D.   On: 11/08/2013 07:26   Dg Chest Port 1 View  (if Code Sepsis Called)  10/22/2013   CLINICAL DATA:  Shortness of  breath, fever, altered mental status  EXAM: PORTABLE CHEST - 1 VIEW  COMPARISON:  09/06/2013, 08/31/2013  FINDINGS: Mild background emphysema noted. Prior coronary bypass changes. Heart is enlarged with central vascular congestion. No effusion or pneumothorax. Increased patchy airspace opacity in the left hilar region and also the left lower lobe retrocardiac area. This could represent pneumonia. Trachea is midline. Atherosclerosis of the aorta. Artifact overlies the left apex.  IMPRESSION: Cardiomegaly with vascular congestion  Patchy left perihilar and retrocardiac left lower lobe airspace process concerning for pneumonia.   Electronically Signed   By: Daryll Brod M.D.   On: 10/22/2013 08:47    Microbiology: Recent Results (from the past 240 hour(s))  CULTURE, BLOOD (ROUTINE X 2)     Status: None   Collection Time    11/11/13  6:26 AM      Result Value Ref Range Status   Specimen Description BLOOD RIGHT WRIST   Final   Special Requests BOTTLES DRAWN AEROBIC AND ANAEROBIC [facility] EACH   Final   Culture  Setup Time     Final   Value: 11/11/2013 11:20     Performed at [facility]   Culture     Final   Value:        BLOOD CULTURE RECEIVED NO GROWTH TO DATE CULTURE WILL BE HELD FOR 5 DAYS BEFORE ISSUING A FINAL NEGATIVE REPORT     Performed at [facility]   Report Status PENDING   Incomplete  CULTURE, BLOOD (ROUTINE X 2)     Status: None   Collection Time    11/11/13  6:35 AM      Result Value Ref Range Status   Specimen Description BLOOD  LEFT HAND   Final   Special Requests BOTTLES DRAWN AEROBIC AND ANAEROBIC [facility] EACH   Final   Culture  Setup Time     Final   Value: 11/11/2013 11:20     Performed at [facility]   Culture     Final   Value:        BLOOD CULTURE RECEIVED NO GROWTH TO DATE CULTURE WILL BE HELD FOR 5 DAYS BEFORE ISSUING A FINAL NEGATIVE REPORT     Performed at [facility]   Report Status PENDING   Incomplete  URINE CULTURE     Status: None    Collection Time    11/11/13  7:29 AM      Result Value Ref Range Status   Specimen Description URINE, CLEAN CATCH   Final   Special Requests NONE   Final   Culture  Setup Time     Final   Value: 11/11/2013 12:18     Performed at [facility]     Final   Value: 4,000 COLONIES/ML     Performed at [facility]   Culture     Final   Value: INSIGNIFICANT GROWTH     Performed at [facility]   Report Status 11/12/2013 FINAL   Final  RAPID STREP SCREEN     Status: None   Collection Time    11/11/13 11:51 AM      Result Value Ref Range Status   Streptococcus, Group A Screen (Direct) NEGATIVE  NEGATIVE Final   Comment: (NOTE)     A Rapid Antigen test may result negative if the antigen level in the     sample is below the detection level of this test. The FDA has not     cleared this test as a stand-alone test therefore the rapid antigen     negative result has reflexed to a Group A Strep culture.  MRSA PCR SCREENING     Status: None   Collection Time    11/11/13  7:02 PM      Result Value Ref Range Status   MRSA by PCR NEGATIVE  NEGATIVE Final   Comment:            The GeneXpert MRSA Assay (FDA     approved for NASAL specimens     only), is one component of a     comprehensive MRSA colonization     surveillance program. It is not     intended to diagnose MRSA     infection nor to guide or     monitor treatment for     MRSA infections.     Labs: Basic Metabolic Panel:  Recent Labs Lab 11/08/13 0640 11/11/13 0614 11/12/13 0823  NA 138 137 141  K 4.4 4.6 3.9  CL 99 100 103  CO2 $Re'24 23 25  'eZo$ GLUCOSE 102* 102* 90  BUN $Re'21 17 16  'HiA$ CREATININE 1.49* 1.34* 1.26*  CALCIUM 9.0 8.7 7.6*   Liver Function Tests:  Recent Labs Lab 11/08/13 0640 11/11/13 0614  AST 33 37  ALT 13 16  ALKPHOS 143* 148*  BILITOT 0.8 0.9  PROT 7.3 7.1  ALBUMIN 3.0* 2.9*   No results found for this basename: LIPASE, AMYLASE,  in the last 168 hours No  results found for this basename: AMMONIA,  in the last 168 hours CBC:  Recent Labs Lab 11/08/13 0640 11/11/13 0614 11/12/13 0823  WBC 5.6 5.0 3.7*  NEUTROABS 1.1* 1.0*  --   HGB 9.4* 8.4* 7.0*  HCT 29.6* 26.2* 21.5*  MCV 97.7 97.0 97.3  PLT 74* 65* 34*   Cardiac Enzymes:  Recent Labs Lab 11/11/13 2122 11/12/13 0210 11/12/13 0823  TROPONINI <0.30 <0.30 <0.30   BNP: BNP (last 3 results)  Recent Labs  09/03/13 1500 09/21/13 1125 10/22/13 0844  PROBNP 14053.0* 203.0* 5985.0*   CBG:  Recent Labs Lab 11/08/13 1224  GLUCAP 99       Signed:  Domenic Polite  [name] 11/12/2013, 5:54 PM

## 2013-11-12 NOTE — Progress Notes (Signed)
11/11/13- @1950pm --The pt states she is feeling better, but IV Morphine 1mg  given per orders & po Xanax .25mg  given early tonight, along with 30cc Maalox, & IV fld's increased to 250cc/hr x 1 hr to give bolus as per orders. The pt tol all without any problems & no further chest pain thus far, & sleeping well tonight.

## 2013-11-12 NOTE — Progress Notes (Signed)
ANTICOAGULATION CONSULT NOTE - Follow Up Consult  Pharmacy Consult for warfarin Indication: atrial fibrillation  No Known Allergies  Patient Measurements: Height: 5' 7.5" (171.5 cm) Weight: 204 lb 9.4 oz (92.8 kg) IBW/kg (Calculated) : 62.75 Heparin Dosing Weight:   Vital Signs: Temp: 98.8 F (37.1 C) (04/26 0610) Temp src: Oral (04/26 0610) BP: 96/41 mmHg (04/26 0436) Pulse Rate: 88 (04/26 0436)  Labs:  Recent Labs  11/10/13 11/11/13 0614 11/11/13 2122 11/12/13 0210  HGB  --  8.4*  --   --   HCT  --  26.2*  --   --   PLT  --  65*  --   --   LABPROT  --  33.2*  --  31.8*  INR 5.1 3.42*  --  3.23*  CREATININE  --  1.34*  --   --   TROPONINI  --   --  <0.30 <0.30    Estimated Creatinine Clearance: 44.8 ml/min (by C-G formula based on Cr of 1.34).   Assessment: [name] known to pharmacy from previous admissions, on warfarin for h/o afib  Per anti-coag visit from 4/24: INR was 5.1 with plans to hold warfarin today, Sun and Mon then start warfarin 2.5mg  daily with 5mg  on SunWed INR today = 3.23 (SUPRAtherapeutic) - last warfarin dose was 4/24 CBC: 4/25 Hgb = 8.4, plts = 65 (platelets decreased from previous admission)  Goal of Therapy:  INR 2-3 Monitor platelets by anticoagulation protocol: Yes   Plan:   INR slowly trending down, remains supratherapeutic - hold warfarin again tonight  Follow-up with daily INR in am  Doreene Eland, PharmD, BCPS.   Pager: [phone]  11/12/2013,7:41 AM

## 2013-11-12 NOTE — Progress Notes (Signed)
Pt given a bedside IS & instructed on its use, the pt claimed she had used one before & could blow a 1100 at first try. Pt lungs congested, & congested cough, had been coughing up scant amt of thick green sputum early last nite, but not coughing up anything at this time. Tessalon pearl given earlier, & pt enc to drink po fld's verbalized understanding to use IS q1hr for 10 blows.

## 2013-11-13 DIAGNOSIS — D638 Anemia in other chronic diseases classified elsewhere: Secondary | ICD-10-CM

## 2013-11-13 DIAGNOSIS — D696 Thrombocytopenia, unspecified: Secondary | ICD-10-CM

## 2013-11-13 DIAGNOSIS — I2510 Atherosclerotic heart disease of native coronary artery without angina pectoris: Secondary | ICD-10-CM

## 2013-11-13 DIAGNOSIS — D649 Anemia, unspecified: Secondary | ICD-10-CM

## 2013-11-13 DIAGNOSIS — D46C Myelodysplastic syndrome with isolated del(5q) chromosomal abnormality: Secondary | ICD-10-CM

## 2013-11-13 LAB — RETICULOCYTES
RBC.: 2.15 MIL/uL — AB (ref 3.87–5.11)
RETIC COUNT ABSOLUTE: 25.8 10*3/uL (ref 19.0–186.0)
Retic Ct Pct: 1.2 % (ref 0.4–3.1)

## 2013-11-13 LAB — IRON AND TIBC
Iron: 53 ug/dL (ref 42–135)
Saturation Ratios: 26 % (ref 20–55)
TIBC: 205 ug/dL — ABNORMAL LOW (ref 250–470)
UIBC: 152 ug/dL (ref 125–400)

## 2013-11-13 LAB — PREPARE RBC (CROSSMATCH)

## 2013-11-13 LAB — CBC
HCT: 21.5 % — ABNORMAL LOW (ref 36.0–46.0)
HEMATOCRIT: 22.3 % — AB (ref 36.0–46.0)
HEMOGLOBIN: 6.9 g/dL — AB (ref 12.0–15.0)
Hemoglobin: 7 g/dL — ABNORMAL LOW (ref 12.0–15.0)
MCH: 31.7 pg (ref 26.0–34.0)
MCH: 30.4 pg (ref 26.0–34.0)
MCHC: 32.6 g/dL (ref 30.0–36.0)
MCHC: 30.9 g/dL (ref 30.0–36.0)
MCV: 97.3 fL (ref 78.0–100.0)
MCV: 98.2 fL (ref 78.0–100.0)
Platelets: 34 10*3/uL — ABNORMAL LOW (ref 150–400)
Platelets: 28 10*3/uL — CL (ref 150–400)
RBC: 2.21 MIL/uL — ABNORMAL LOW (ref 3.87–5.11)
RBC: 2.27 MIL/uL — AB (ref 3.87–5.11)
RDW: 21.7 % — AB (ref 11.5–15.5)
RDW: 21.3 % — ABNORMAL HIGH (ref 11.5–15.5)
WBC: 3.7 10*3/uL — ABNORMAL LOW (ref 4.0–10.5)
WBC: 3.2 10*3/uL — ABNORMAL LOW (ref 4.0–10.5)

## 2013-11-13 LAB — BASIC METABOLIC PANEL
BUN: 16 mg/dL (ref 6–23)
CO2: 22 meq/L (ref 19–32)
Calcium: 7.7 mg/dL — ABNORMAL LOW (ref 8.4–10.5)
Chloride: 106 mEq/L (ref 96–112)
Creatinine, Ser: 1.21 mg/dL — ABNORMAL HIGH (ref 0.50–1.10)
GFR calc Af Amer: 51 mL/min — ABNORMAL LOW (ref >90)
GFR, EST NON AFRICAN AMERICAN: 44 mL/min — AB (ref >90)
GLUCOSE: 112 mg/dL — AB (ref 70–99)
Potassium: 4.2 mEq/L (ref 3.7–5.3)
SODIUM: 141 meq/L (ref 137–147)

## 2013-11-13 LAB — CULTURE, GROUP A STREP

## 2013-11-13 LAB — HEMOGLOBIN AND HEMATOCRIT, BLOOD
HCT: 26.8 % — ABNORMAL LOW (ref 36.0–46.0)
Hemoglobin: 8.6 g/dL — ABNORMAL LOW (ref 12.0–15.0)

## 2013-11-13 LAB — EXPECTORATED SPUTUM ASSESSMENT W REFEX TO RESP CULTURE

## 2013-11-13 LAB — PROTIME-INR
INR: 3.07 — ABNORMAL HIGH (ref 0.00–1.49)
PROTHROMBIN TIME: 30.6 s — AB (ref 11.6–15.2)

## 2013-11-13 LAB — EXPECTORATED SPUTUM ASSESSMENT W GRAM STAIN, RFLX TO RESP C

## 2013-11-13 LAB — FOLATE: FOLATE: 17 ng/mL

## 2013-11-13 LAB — VITAMIN B12: Vitamin B-12: 836 pg/mL (ref 211–911)

## 2013-11-13 LAB — FERRITIN: Ferritin: 474 ng/mL — ABNORMAL HIGH (ref 10–291)

## 2013-11-13 MED ORDER — SODIUM CHLORIDE 0.9 % IV SOLN
INTRAVENOUS | Status: DC
  Administered 2013-11-13: 1 mL via INTRAVENOUS

## 2013-11-13 MED ORDER — METOPROLOL TARTRATE 25 MG PO TABS
25.0000 mg | ORAL_TABLET | Freq: Two times a day (BID) | ORAL | Status: DC
  Administered 2013-11-13 (×2): 25 mg via ORAL
  Filled 2013-11-13 (×4): qty 1

## 2013-11-13 NOTE — Progress Notes (Signed)
ANTICOAGULATION CONSULT NOTE - Follow Up Consult  Pharmacy Consult for warfarin Indication: atrial fibrillation  No Known Allergies  Patient Measurements: Height: 5' 7.5" (171.5 cm) Weight: 205 lb 4 oz (93.1 kg) IBW/kg (Calculated) : 62.75    Vital Signs: Temp: 97.4 F (36.3 C) (04/27 1115) Temp src: Oral (04/27 1115) BP: 122/51 mmHg (04/27 1115) Pulse Rate: 85 (04/27 1115)  Labs:  Recent Labs  11/11/13 0174 11/11/13 2122 11/12/13 0210 11/12/13 0823 11/13/13 0314  HGB 8.4*  --   --  7.0* 6.9*  HCT 26.2*  --   --  21.5* 22.3*  PLT 65*  --   --  34* 28*  LABPROT 33.2*  --  31.8*  --  30.6*  INR 3.42*  --  3.23*  --  3.07*  CREATININE 1.34*  --   --  1.26* 1.21*  TROPONINI  --  <0.30 <0.30 <0.30  --     Estimated Creatinine Clearance: 49.7 ml/min (by C-G formula based on Cr of 1.21).   Assessment: 72 yof known to pharmacy from previous admissions, on warfarin for h/o afib  Per anti-coag visit from 4/24: INR was 5.1 with plans to hold warfarin today, Sun and Mon then start warfarin 2.5mg  daily with 5mg  on SunWed INR today = 3.07 (SUPRAtherapeutic) - last warfarin dose was 4/24 CBC: 4/27 Hgb = 6.9, plts = 28 (pt. has MDS)  Goal of Therapy:  INR 2-3 Monitor platelets by anticoagulation protocol: Yes   Plan:   INR slowly trending down, remains supratherapeutic - hold warfarin again tonight  Follow-up with daily INR in am  Dolly Rias RPh 11/13/2013, 11:40 AM Pager [phone]

## 2013-11-13 NOTE — Progress Notes (Signed)
Patient became very confused and pulling at heart monitor and IV lines. Placed patient in mittens and explained, will continue to monitor and reorient.

## 2013-11-13 NOTE — Progress Notes (Signed)
Patient had 2 long pauses tonight, one with 2.41 sec and the other with 2.09 sec. Patient is sleeping and asymptomatic. Lamar Blinks NP was made aware. Will  Continue to monitor patient.

## 2013-11-13 NOTE — Progress Notes (Signed)
[name] PROGRESS NOTE  [name] JKD:[id number] DOB: Aug 27, 1940 DOA: 11/11/2013 PCP: Noralee Space, MD  Assessment/Plan: 1. Sepsis/HCAP -repeat CXR suggestive of pneumonia and correlates with symptoms -DC Vanc, continue Zosyn,  -cultures negative so far -cut down IVF -clinically improving  2. Metabolic encephalopathy -due to 1 -improved  3. COPD (chronic obstructive pulmonary disease)  - no exacerbation - continue nebs PRN   4. MDS (myelodysplastic syndrome) with 5q deletion  Plasma cell dyscrasia  - D/w Dr.Mohamed recommends holding Revlimid for now   5. A-fib with RVR  - rate improved with hydration  - overnight with couple of 2s pauses will cut down metoprolol to 25mg  BID and monitor  - coumadin per pharmacy   6. Elevated INR  - coumadin on hold per pharmacy  DVT proph: on chronic anticoagulation  Ambulate, PT  Code Status: Partial Code Family Communication: none at bedside Disposition Plan: home when improved   Antibiotics:  Vanc/Zosyn  HPI/Subjective: Coughing a lot greenish phlegm since yesterday  Objective: Filed Vitals:   11/13/13 1135  BP: 111/40  Pulse: 82  Temp: 98 F (36.7 C)  Resp: 24    Intake/Output Summary (Last 24 hours) at 11/13/13 1217 Last data filed at 11/13/13 1119  Gross per 24 hour  Intake   1343 ml  Output      0 ml  Net   1343 ml   Filed Weights   11/11/13 1245 11/12/13 0436 11/13/13 0500  Weight: 93.5 kg (206 lb 2.1 oz) 92.8 kg (204 lb 9.4 oz) 93.1 kg (205 lb 4 oz)    Exam:   General: AAOx3  Cardiovascular: S1S2/RRR  Respiratory: scattered ronchi  Abdomen: soft, Nt, BS present  Musculoskeletal: no edema c/c  Data Reviewed: Basic Metabolic Panel:  Recent Labs Lab 11/08/13 0640 11/11/13 0614 11/12/13 0823 11/13/13 0314  NA 138 137 141 141  K 4.4 4.6 3.9 4.2  CL 99 100 103 106  CO2 24 23 25 22   GLUCOSE 102* 102* 90 112*  BUN 21 17 16 16   CREATININE 1.49* 1.34* 1.26* 1.21*   CALCIUM 9.0 8.7 7.6* 7.7*   Liver Function Tests:  Recent Labs Lab 11/08/13 0640 11/11/13 0614  AST 33 37  ALT 13 16  ALKPHOS 143* 148*  BILITOT 0.8 0.9  PROT 7.3 7.1  ALBUMIN 3.0* 2.9*   No results found for this basename: LIPASE, AMYLASE,  in the last 168 hours No results found for this basename: AMMONIA,  in the last 168 hours CBC:  Recent Labs Lab 11/08/13 0640 11/11/13 0614 11/12/13 0823 11/13/13 0314  WBC 5.6 5.0 3.7* 3.2*  NEUTROABS 1.1* 1.0*  --   --   HGB 9.4* 8.4* 7.0* 6.9*  HCT 29.6* 26.2* 21.5* 22.3*  MCV 97.7 97.0 97.3 98.2  PLT 74* 65* 34* 28*   Cardiac Enzymes:  Recent Labs Lab 11/11/13 2122 11/12/13 0210 11/12/13 0823  TROPONINI <0.30 <0.30 <0.30   BNP (last 3 results)  Recent Labs  09/03/13 1500 09/21/13 1125 10/22/13 0844  PROBNP 14053.0* 203.0* 5985.0*   CBG:  Recent Labs Lab 11/08/13 0643  GLUCAP 99    Recent Results (from the past 240 hour(s))  CULTURE, BLOOD (ROUTINE X 2)     Status: None   Collection Time    11/11/13  6:26 AM      Result Value Ref Range Status   Specimen Description BLOOD RIGHT WRIST   Final   Special Requests BOTTLES DRAWN AEROBIC AND ANAEROBIC 5CC EACH  Final   Culture  Setup Time     Final   Value: 11/11/2013 11:20     Performed at [facility]   Culture     Final   Value:        BLOOD CULTURE RECEIVED NO GROWTH TO DATE CULTURE WILL BE HELD FOR 5 DAYS BEFORE ISSUING A FINAL NEGATIVE REPORT     Performed at [facility]   Report Status PENDING   Incomplete  CULTURE, BLOOD (ROUTINE X 2)     Status: None   Collection Time    11/11/13  6:35 AM      Result Value Ref Range Status   Specimen Description BLOOD LEFT HAND   Final   Special Requests BOTTLES DRAWN AEROBIC AND ANAEROBIC 5CC EACH   Final   Culture  Setup Time     Final   Value: 11/11/2013 11:20     Performed at [facility]   Culture     Final   Value:        BLOOD CULTURE RECEIVED NO GROWTH TO DATE CULTURE WILL  BE HELD FOR 5 DAYS BEFORE ISSUING A FINAL NEGATIVE REPORT     Performed at [facility]   Report Status PENDING   Incomplete  URINE CULTURE     Status: None   Collection Time    11/11/13  7:29 AM      Result Value Ref Range Status   Specimen Description URINE, CLEAN CATCH   Final   Special Requests NONE   Final   Culture  Setup Time     Final   Value: 11/11/2013 12:18     Performed at [place]     Final   Value: 4,000 COLONIES/ML     Performed at [facility]   Culture     Final   Value: INSIGNIFICANT GROWTH     Performed at [facility]   Report Status 11/12/2013 FINAL   Final  RAPID STREP SCREEN     Status: None   Collection Time    11/11/13 11:51 AM      Result Value Ref Range Status   Streptococcus, Group A Screen (Direct) NEGATIVE  NEGATIVE Final   Comment: (NOTE)     A Rapid Antigen test may result negative if the antigen level in the     sample is below the detection level of this test. The FDA has not     cleared this test as a stand-alone test therefore the rapid antigen     negative result has reflexed to a Group A Strep culture.  CULTURE, GROUP A STREP     Status: None   Collection Time    11/11/13 11:51 AM      Result Value Ref Range Status   Specimen Description THROAT   Final   Special Requests NONE   Final   Culture     Final   Value: No Beta Hemolytic Streptococci Isolated     Performed at [facility]   Report Status 11/13/2013 FINAL   Final  MRSA PCR SCREENING     Status: None   Collection Time    11/11/13  7:02 PM      Result Value Ref Range Status   MRSA by PCR NEGATIVE  NEGATIVE Final   Comment:            The GeneXpert MRSA Assay (FDA     approved for  NASAL specimens     only), is one component of a     comprehensive MRSA colonization     surveillance program. It is not     intended to diagnose MRSA     infection nor to guide or     monitor treatment for     MRSA infections.      Studies: Dg Chest 2 View  11/12/2013   CLINICAL DATA:  Cough and fever  EXAM: CHEST  2 VIEW  COMPARISON:  DG CHEST 1V PORT dated 11/11/2013  FINDINGS: Sternotomy wires overlie normal cardiac silhouette. There is a left retrocardiac opacity which is subtle. Right lung base is clear. Upper lobes are clear. There is a chronic bronchitic markings.  IMPRESSION: Left lower lobe density representing atelectasis versus infiltrate.   Electronically Signed   By: Suzy Bouchard M.D.   On: 11/12/2013 11:44    Scheduled Meds: . ALPRAZolam  0.25-0.5 mg Oral TID  . aspirin EC  81 mg Oral QPC supper  . ferrous sulfate  325 mg Oral QPM  . gabapentin  600 mg Oral TID  . guaiFENesin  1,200 mg Oral BID  . metoprolol  25 mg Oral BID  . mometasone-formoterol  2 puff Inhalation BID  . nortriptyline  50 mg Oral QHS  . pantoprazole  40 mg Oral Daily  . piperacillin-tazobactam (ZOSYN)  IV  3.375 g Intravenous 3 times per day  . potassium chloride SA  20 mEq Oral BID  . simvastatin  20 mg Oral QHS  . sodium chloride  3 mL Intravenous Q12H  . vancomycin  1,250 mg Intravenous Q24H  . Warfarin - Pharmacist Dosing Inpatient   Does not apply q1800   Continuous Infusions: . sodium chloride 1 mL (11/13/13 1103)   Antibiotics Given (last 72 hours)   Date/Time Action Medication Dose Rate   11/11/13 1428 Given   vancomycin (VANCOCIN) 1,250 mg in sodium chloride 0.9 % 250 mL IVPB 1,250 mg 166.7 mL/hr   11/11/13 1429 Given   piperacillin-tazobactam (ZOSYN) IVPB 3.375 g 3.375 g 12.5 mL/hr   11/11/13 2206 Given   piperacillin-tazobactam (ZOSYN) IVPB 3.375 g 3.375 g 12.5 mL/hr   11/12/13 0521 Given   piperacillin-tazobactam (ZOSYN) IVPB 3.375 g 3.375 g 12.5 mL/hr   11/12/13 1403 Given   vancomycin (VANCOCIN) 1,250 mg in sodium chloride 0.9 % 250 mL IVPB 1,250 mg 166.7 mL/hr   11/12/13 1403 Given   piperacillin-tazobactam (ZOSYN) IVPB 3.375 g 3.375 g 12.5 mL/hr   11/12/13 2251 Given   piperacillin-tazobactam  (ZOSYN) IVPB 3.375 g 3.375 g 12.5 mL/hr   11/13/13 0521 Given   piperacillin-tazobactam (ZOSYN) IVPB 3.375 g 3.375 g 12.5 mL/hr      Principal Problem:   SIRS (systemic inflammatory response syndrome) Active Problems:   COPD (chronic obstructive pulmonary disease)   CKD (chronic kidney disease), stage III   Atrial fibrillation with RVR   MDS (myelodysplastic syndrome) with 5q deletion   Plasma cell dyscrasia   Encephalopathy acute   Acute encephalopathy   Fever, unspecified   Sepsis    Time spent: 24min    Cambry Spampinato  [name] Pager [phone]. If 7PM-7AM, please contact night-coverage at [email or web address], password [facility] 11/13/2013, 12:17 PM  LOS: 2 days

## 2013-11-13 NOTE — Progress Notes (Signed)
DIAGNOSIS:  1) Refractory anemia with excess blasts, myelodysplastic syndrome with 5q deletion diagnosed in March of 2015.  2) plasma cell dyscrasia.   PRIOR THERAPY: None   CURRENT THERAPY: Revlimid 10 mg by mouth daily for 21 days every 4 weeks.  Subjective: The patient is seen and examined today. Her husband was at the bedside. She was recently diagnosed with refractory anemia with excess blasts consistent with myelodysplastic syndrome with 5q deletion in addition to plasma cell dyscrasia. The patient was recently started on Revlimid 10 mg by mouth daily and she took only 2 doses. She was admitted to [facility] with fever and confusion. Chest x-ray showed pneumonia and the patient was started on treatment with vancomycin and Zosyn. She is feeling a little bit better but continues to have significant fatigue and weakness.  Objective: Vital signs in last 24 hours: Temp:  [97.4 F (36.3 C)-98.4 F (36.9 C)] 97.9 F (36.6 C) (04/27 1420) Pulse Rate:  [54-96] 84 (04/27 1420) Resp:  [19-24] 22 (04/27 1420) BP: (111-130)/(34-70) 130/70 mmHg (04/27 1420) SpO2:  [91 %-100 %] 99 % (04/27 1420) Weight:  [205 lb 4 oz (93.1 kg)] 205 lb 4 oz (93.1 kg) (04/27 0500)  Intake/Output from previous day: 04/26 0701 - 04/27 0700 In: 1340 [P.O.:840; I.V.:400; IV Piggyback:100] Out: -  Intake/Output this shift: Total I/O In: 1060.1 [P.O.:360; I.V.:303; Blood:347.1; IV Piggyback:50] Out: -   General appearance: alert, cooperative, fatigued and no distress Resp: rales LLL Cardio: regular rate and rhythm, S1, S2 normal, no murmur, click, rub or gallop GI: soft, non-tender; bowel sounds normal; no masses,  no organomegaly Extremities: extremities normal, atraumatic, no cyanosis or edema  Lab Results:   Recent Labs  11/12/13 0823 11/13/13 0314  WBC 3.7* 3.2*  HGB 7.0* 6.9*  HCT 21.5* 22.3*  PLT 34* 28*   BMET  Recent Labs  11/12/13 0823 11/13/13 0314  NA 141 141  K 3.9 4.2   CL 103 106  CO2 25 22  GLUCOSE 90 112*  BUN 16 16  CREATININE 1.26* 1.21*  CALCIUM 7.6* 7.7*    Studies/Results: Dg Chest 2 View  11/12/2013   CLINICAL DATA:  Cough and fever  EXAM: CHEST  2 VIEW  COMPARISON:  DG CHEST 1V PORT dated 11/11/2013  FINDINGS: Sternotomy wires overlie normal cardiac silhouette. There is a left retrocardiac opacity which is subtle. Right lung base is clear. Upper lobes are clear. There is a chronic bronchitic markings.  IMPRESSION: Left lower lobe density representing atelectasis versus infiltrate.   Electronically Signed   By: Suzy Bouchard M.D.   On: 11/12/2013 11:44    Medications: I have reviewed the patient's current medications.   Assessment/Plan: 1) left lung pneumonia: Continue current treatment with Zosyn. 2) myelodysplastic syndrome with 5q deletion in addition to plasma cell dyscrasia: The patient was just started treatment with Revlimid status post 2 days. I would hold her treatment for now until recovery from her recent diagnosis of pneumonia. 3) anemia: Secondary to MDS. Consider 2 units of PRBCs transfusion today. 4) thrombocytopenia: Will monitor for now and consider the patient for platelet transfusion if her platelets count is less than 10,000 or if she has any bleeding issues. Thank you so much for taking good care of [name], I will continue to follow the patient with you and assist in her management on as-needed basis.  LOS: 2 days    Curt Bears 11/13/2013

## 2013-11-13 NOTE — Progress Notes (Signed)
CRITICAL VALUE ALERT  Critical value received:  Hgb 6.9, Platelet 28  Date of notification:  11/13/2013  Time of notification:  0500  Critical value read back:yes  Nurse who received alert:  L. Vergel de Larkin Ina RN  MD notified (1st page):  Lamar Blinks NP  Time of first page:  0505  MD notified (2nd page):  Time of second page:  Responding MD:  Lamar Blinks NP  Time MD responded:  [phone]

## 2013-11-14 ENCOUNTER — Inpatient Hospital Stay (HOSPITAL_COMMUNITY): Payer: Medicare Other

## 2013-11-14 LAB — BLOOD GAS, ARTERIAL
Acid-base deficit: 0.5 mmol/L (ref 0.0–2.0)
Bicarbonate: 22.6 mEq/L (ref 20.0–24.0)
DRAWN BY: 31814
FIO2: 0.28 %
O2 Saturation: 94.5 %
PCO2 ART: 35.5 mmHg (ref 35.0–45.0)
PO2 ART: 75.4 mmHg — AB (ref 80.0–100.0)
Patient temperature: 101.6
TCO2: 21.3 mmol/L (ref 0–100)
pH, Arterial: 7.429 (ref 7.350–7.450)

## 2013-11-14 LAB — CBC WITH DIFFERENTIAL/PLATELET
Basophils Absolute: 0.2 10*3/uL — ABNORMAL HIGH (ref 0.0–0.1)
Basophils Relative: 4 % — ABNORMAL HIGH (ref 0–1)
EOS PCT: 1 % (ref 0–5)
Eosinophils Absolute: 0 10*3/uL (ref 0.0–0.7)
HCT: 26 % — ABNORMAL LOW (ref 36.0–46.0)
Hemoglobin: 8.2 g/dL — ABNORMAL LOW (ref 12.0–15.0)
Lymphocytes Relative: 61 % — ABNORMAL HIGH (ref 12–46)
Lymphs Abs: 2.8 10*3/uL (ref 0.7–4.0)
MCH: 30.4 pg (ref 26.0–34.0)
MCHC: 31.5 g/dL (ref 30.0–36.0)
MCV: 96.3 fL (ref 78.0–100.0)
MONO ABS: 0.6 10*3/uL (ref 0.1–1.0)
MONOS PCT: 14 % — AB (ref 3–12)
Neutro Abs: 0.9 10*3/uL — ABNORMAL LOW (ref 1.7–7.7)
Neutrophils Relative %: 20 % — ABNORMAL LOW (ref 43–77)
PLATELETS: 45 10*3/uL — AB (ref 150–400)
RBC: 2.7 MIL/uL — AB (ref 3.87–5.11)
RDW: 20.5 % — ABNORMAL HIGH (ref 11.5–15.5)
WBC: 4.5 10*3/uL (ref 4.0–10.5)

## 2013-11-14 LAB — URINALYSIS, ROUTINE W REFLEX MICROSCOPIC
Bilirubin Urine: NEGATIVE
Glucose, UA: NEGATIVE mg/dL
Hgb urine dipstick: NEGATIVE
KETONES UR: NEGATIVE mg/dL
LEUKOCYTES UA: NEGATIVE
NITRITE: NEGATIVE
PROTEIN: NEGATIVE mg/dL
Specific Gravity, Urine: 1.015 (ref 1.005–1.030)
UROBILINOGEN UA: 2 mg/dL — AB (ref 0.0–1.0)
pH: 6.5 (ref 5.0–8.0)

## 2013-11-14 LAB — URINE MICROSCOPIC-ADD ON

## 2013-11-14 LAB — BASIC METABOLIC PANEL
BUN: 12 mg/dL (ref 6–23)
CO2: 21 mEq/L (ref 19–32)
Calcium: 7.9 mg/dL — ABNORMAL LOW (ref 8.4–10.5)
Chloride: 101 mEq/L (ref 96–112)
Creatinine, Ser: 1.14 mg/dL — ABNORMAL HIGH (ref 0.50–1.10)
GFR, EST AFRICAN AMERICAN: 54 mL/min — AB (ref >90)
GFR, EST NON AFRICAN AMERICAN: 47 mL/min — AB (ref >90)
Glucose, Bld: 90 mg/dL (ref 70–99)
POTASSIUM: 4.5 meq/L (ref 3.7–5.3)
SODIUM: 136 meq/L — AB (ref 137–147)

## 2013-11-14 LAB — PROTIME-INR
INR: 2.45 — ABNORMAL HIGH (ref 0.00–1.49)
Prothrombin Time: 25.8 seconds — ABNORMAL HIGH (ref 11.6–15.2)

## 2013-11-14 LAB — LACTIC ACID, PLASMA: Lactic Acid, Venous: 1.1 mmol/L (ref 0.5–2.2)

## 2013-11-14 LAB — MAGNESIUM: Magnesium: 1.6 mg/dL (ref 1.5–2.5)

## 2013-11-14 MED ORDER — VITAMINS A & D EX OINT
TOPICAL_OINTMENT | CUTANEOUS | Status: AC
  Administered 2013-11-14: 11:00:00
  Filled 2013-11-14: qty 5

## 2013-11-14 MED ORDER — METOPROLOL TARTRATE 1 MG/ML IV SOLN
5.0000 mg | Freq: Once | INTRAVENOUS | Status: AC
  Administered 2013-11-14: 5 mg via INTRAVENOUS
  Filled 2013-11-14: qty 5

## 2013-11-14 MED ORDER — METOPROLOL TARTRATE 25 MG PO TABS
37.5000 mg | ORAL_TABLET | Freq: Two times a day (BID) | ORAL | Status: DC
  Administered 2013-11-14 – 2013-11-15 (×3): 37.5 mg via ORAL
  Filled 2013-11-14 (×5): qty 1

## 2013-11-14 MED ORDER — SODIUM CHLORIDE 0.9 % IV BOLUS (SEPSIS)
500.0000 mL | Freq: Once | INTRAVENOUS | Status: AC
  Administered 2013-11-14: 500 mL via INTRAVENOUS

## 2013-11-14 MED ORDER — WARFARIN SODIUM 2.5 MG PO TABS
2.5000 mg | ORAL_TABLET | Freq: Once | ORAL | Status: AC
  Administered 2013-11-14: 2.5 mg via ORAL
  Filled 2013-11-14: qty 1

## 2013-11-14 NOTE — Evaluation (Signed)
Physical Therapy Evaluation Patient Details Name: [name] MRN: [id number] DOB: 05-13-1941 Today's Date: 11/14/2013   History of Present Illness  pt admitted with sepsis, AMS, HCAP.  Clinical Impression  Pt Dyspneic during mobility, difficult to speak. Pt will benefit from PT to address problems listed . Spouse not present to discuss DC plan.     Follow Up Recommendations Home health PT;Supervision/Assistance - 24 hour    Equipment Recommendations  None recommended by PT    Recommendations for Other Services       Precautions / Restrictions Precautions Precautions: Fall      Mobility  Bed Mobility Overal bed mobility: Needs Assistance Bed Mobility: Supine to Sit;Sit to Supine     Supine to sit: Supervision;HOB elevated Sit to supine: Supervision   General bed mobility comments: use of rail  Transfers Overall transfer level: Needs assistance Equipment used: Rolling walker (2 wheeled) Transfers: Sit to/from [facility] Sit to Stand: Min assist Stand pivot transfers: Min assist       General transfer comment: cues for safety, remain holding onto RW duering pivots.  Ambulation/Gait Ambulation/Gait assistance: +2 safety/equipment;Min assist Ambulation Distance (Feet): 60 Feet Assistive device: Rolling walker (2 wheeled) Gait Pattern/deviations: Step-through pattern;Staggering left;Staggering right;Decreased stride length     General Gait Details: slow gait speed. dyspnea 3/4. O2 sats85% on RA. Fatigues easily.   Stairs            Wheelchair Mobility    Modified Rankin (Stroke Patients Only)       Balance Overall balance assessment: Needs assistance         Standing balance support: No upper extremity supported;During functional activity Standing balance-Leahy Scale: Poor                               Pertinent Vitals/Pain HR 118, Sats difficult to pick up, ? Down in 80's.    Home Living Family/patient  expects to be discharged to:: Private residence Living Arrangements: Spouse/significant other Available Help at Discharge: Family Type of Home: House Home Access: Ramped entrance;Stairs to enter     Home Layout: One level Home Equipment: Environmental consultant - 2 wheels;Wheelchair - Regulatory affairs officer - single point;Bedside commode;Hand held shower head;Grab bars - tub/shower      Prior Function Level of Independence: Needs assistance      ADL's / Homemaking Assistance Needed: Requires assist with IADLs        Hand Dominance        Extremity/Trunk Assessment   Upper Extremity Assessment: Generalized weakness           Lower Extremity Assessment: Generalized weakness         Communication      Cognition Arousal/Alertness: Awake/alert Behavior During Therapy: WFL for tasks assessed/performed Overall Cognitive Status: Within Functional Limits for tasks assessed                      General Comments      Exercises        Assessment/Plan    PT Assessment Patient needs continued PT services  PT Diagnosis Difficulty walking;Generalized weakness   PT Problem List Decreased strength;Decreased activity tolerance;Decreased mobility;Obesity;Pain  PT Treatment Interventions DME instruction;Gait training;Functional mobility training;Therapeutic activities;Therapeutic exercise;Patient/family education   PT Goals (Current goals can be found in the Care Plan section) Acute Rehab PT Goals Patient Stated Goal: go home PT Goal Formulation: With patient Time For Goal Achievement: 11/28/13 Potential  to Achieve Goals: Good    Frequency Min 3X/week   Barriers to discharge        Co-evaluation               End of Session Equipment Utilized During Treatment: Gait belt Activity Tolerance: Patient limited by fatigue Patient left: in bed;with call bell/phone within reach;with bed alarm set Nurse Communication: Patient requests pain meds;Mobility status          Time: 1459-1515 PT Time Calculation (min): 16 min   Charges:   PT Evaluation $Initial PT Evaluation Tier I: 1 Procedure PT Treatments $Gait Training: 8-22 mins   PT G Codes:          Claretha Cooper 11/14/2013, 3:36 PM Tresa Endo PT [phone]

## 2013-11-14 NOTE — Progress Notes (Addendum)
Shift event: RN paged secondary to pt being more confused, HR with Afib in the 120s, RR 24 with O2 sat 98%. BP OK.  Pt here with PNA on Zosyn. T 101.6. Blood cx's x 2 repeated, CXR, UA, CBC with diff, and BMP.  S: pt says she is a little SOB. No other issues. Husband at bedside says she has been a little confused since receiving Ultram yesterday but seems to have gotten worse tonight-restlessness, trying to get OOB. O: VS as above. Pt is chronically ill appearing elderly WF in NAD. She is slightly lethargic, but easily aroused. Knows her name. Disoriented to place, date. Follow commands. Strength 5/5. Card: Irreg irreg. RR slightly increased with mildly increased WOB. O2 on.  A/P: 1. Confusion-could be several issues including hypoxia, increased CO2, fever, meds, infection. Labs/tests as above. ABG pending. Hasn't desatted. Redo bld cx (previous ones no growth so far). She is not in any distress at moment, will await lab results. Discussed plan with pt's husband. I do not feel she warrants transfer to SDU at this time. Will follow.  2. AFib with RVR-Metoprolol 5mg  IV once, will follow. Is on BB po as well. This is not a new rhythm.  3. Fever secondary to SIRS/sepsis-Tylenol, plan as above.  4. PNA-on Zosyn, recheck CXR.  Clance Boll, NP [name] Update: CXR consistent with PNA, HR down to 110. Bolus given for HR and fever. ABG looked fine. UA pending. WBCC not elevated. Bld cx pending. Lactate pending. Mg normal. Reviewed pt's meds. D/c'd Zanaflex and Ultram. Per MD notes, has been encephalopathic since admission. Will follow.  KJKG, NP Update: Lactate normal. UA normal. HR in the 80s now. BP stable. Satting 100%. Afebrile. KJKG, NP

## 2013-11-14 NOTE — Progress Notes (Signed)
ANTIBIOTIC CONSULT NOTE - FOLLOW UP   Pharmacy Consult for Zosyn Indication: HCAP  No Known Allergies  Patient Measurements: Height: 5' 7.5" (171.5 cm) Weight: 207 lb 7.3 oz (94.1 kg) IBW/kg (Calculated) : 62.75 :   Vital Signs: Temp: 97.8 F (36.6 C) (04/28 0607) Temp src: Oral (04/28 0607) BP: 117/75 mmHg (04/28 0607) Pulse Rate: 88 (04/28 0607) Intake/Output from previous day: 04/27 0701 - 04/28 0700 In: 2535.1 [P.O.:360; I.V.:1053; Blood:347.1; IV Piggyback:775] Out: 750 [Urine:750] Intake/Output from this shift:    Labs:  Recent Labs  11/12/13 0823 11/13/13 0314 11/13/13 1746 11/14/13 0235  WBC 3.7* 3.2*  --  4.5  HGB 7.0* 6.9* 8.6* 8.2*  PLT 34* 28*  --  45*  CREATININE 1.26* 1.21*  --  1.14*   Estimated Creatinine Clearance: 53 ml/min (by C-G formula based on Cr of 1.14). No results found for this basename: VANCOTROUGH, VANCOPEAK, VANCORANDOM, [place], GENTPEAK, GENTRANDOM, [place], TOBRAPEAK, TOBRARND, AMIKACINPEAK, AMIKACINTROU, AMIKACIN,  in the last 72 hours   Microbiology: Recent Results (from the past 720 hour(s))  CULTURE, BLOOD (ROUTINE X 2)     Status: None   Collection Time    10/22/13  8:44 AM      Result Value Ref Range Status   Specimen Description BLOOD RIGHT ANTECUBITAL   Final   Special Requests BOTTLES DRAWN AEROBIC AND ANAEROBIC 5 CC EACH   Final   Culture  Setup Time     Final   Value: 10/22/2013 15:20     Performed at [facility]   Culture     Final   Value: NO GROWTH 5 DAYS     Performed at [facility]   Report Status 10/28/2013 FINAL   Final  CULTURE, BLOOD (ROUTINE X 2)     Status: None   Collection Time    10/22/13  8:44 AM      Result Value Ref Range Status   Specimen Description BLOOD BLOOD LEFT FOREARM   Final   Special Requests BOTTLES DRAWN AEROBIC AND ANAEROBIC 4 CC [facility]   Final   Culture  Setup Time     Final   Value: 10/22/2013 15:20     Performed at [facility]   Culture      Final   Value: NO GROWTH 5 DAYS     Performed at [facility]   Report Status 10/28/2013 FINAL   Final  URINE CULTURE     Status: None   Collection Time    10/22/13 11:41 AM      Result Value Ref Range Status   Specimen Description URINE, CLEAN CATCH   Final   Special Requests NONE   Final   Culture  Setup Time     Final   Value: 10/22/2013 19:23     Performed at [place]     Final   Value: NO GROWTH     Performed at [facility]   Culture     Final   Value: NO GROWTH     Performed at [facility]   Report Status 10/23/2013 FINAL   Final  MRSA PCR SCREENING     Status: Abnormal   Collection Time    10/22/13 11:43 AM      Result Value Ref Range Status   MRSA by PCR POSITIVE (*) NEGATIVE Final   Comment:            The GeneXpert MRSA Assay (FDA  approved for NASAL specimens     only), is one component of a     comprehensive MRSA colonization     surveillance program. It is not     intended to diagnose MRSA     infection nor to guide or     monitor treatment for     MRSA infections.     RESULT CALLED TO, READ BACK BY AND VERIFIED WITH:     D.VAUDREUIL AT 1504 ON [id number] BY C.BONGEL  CULTURE, EXPECTORATED SPUTUM-ASSESSMENT     Status: None   Collection Time    10/23/13 11:10 AM      Result Value Ref Range Status   Specimen Description SPUTUM   Final   Special Requests NONE   Final   Sputum evaluation     Final   Value: THIS SPECIMEN IS ACCEPTABLE. RESPIRATORY CULTURE REPORT TO FOLLOW.   Report Status 10/23/2013 FINAL   Final  CULTURE, RESPIRATORY (NON-EXPECTORATED)     Status: None   Collection Time    10/23/13 11:10 AM      Result Value Ref Range Status   Specimen Description SPUTUM   Final   Special Requests NONE   Final   Gram Stain     Final   Value: NO WBC SEEN     NO SQUAMOUS EPITHELIAL CELLS SEEN     NO ORGANISMS SEEN     Performed at [facility]   Culture     Final   Value: NORMAL  OROPHARYNGEAL FLORA     Performed at [facility]   Report Status 10/25/2013 FINAL   Final  TECHNOLOGIST REVIEW     Status: None   Collection Time    11/02/13 10:33 AM      Result Value Ref Range Status   Technologist Review 2% blast, rare nrbc, Metas and Myelocytes present   Final  CULTURE, BLOOD (ROUTINE X 2)     Status: None   Collection Time    11/11/13  6:26 AM      Result Value Ref Range Status   Specimen Description BLOOD RIGHT WRIST   Final   Special Requests BOTTLES DRAWN AEROBIC AND ANAEROBIC 5CC EACH   Final   Culture  Setup Time     Final   Value: 11/11/2013 11:20     Performed at [facility]   Culture     Final   Value:        BLOOD CULTURE RECEIVED NO GROWTH TO DATE CULTURE WILL BE HELD FOR 5 DAYS BEFORE ISSUING A FINAL NEGATIVE REPORT     Performed at [facility]   Report Status PENDING   Incomplete  CULTURE, BLOOD (ROUTINE X 2)     Status: None   Collection Time    11/11/13  6:35 AM      Result Value Ref Range Status   Specimen Description BLOOD LEFT HAND   Final   Special Requests BOTTLES DRAWN AEROBIC AND ANAEROBIC 5CC EACH   Final   Culture  Setup Time     Final   Value: 11/11/2013 11:20     Performed at [facility]   Culture     Final   Value:        BLOOD CULTURE RECEIVED NO GROWTH TO DATE CULTURE WILL BE HELD FOR 5 DAYS BEFORE ISSUING A FINAL NEGATIVE REPORT     Performed at [facility]   Report Status PENDING   Incomplete  URINE CULTURE  Status: None   Collection Time    11/11/13  7:29 AM      Result Value Ref Range Status   Specimen Description URINE, CLEAN CATCH   Final   Special Requests NONE   Final   Culture  Setup Time     Final   Value: 11/11/2013 12:18     Performed at [place]     Final   Value: 4,000 COLONIES/ML     Performed at [facility]   Culture     Final   Value: INSIGNIFICANT GROWTH     Performed at [facility]   Report Status 11/12/2013  FINAL   Final  RAPID STREP SCREEN     Status: None   Collection Time    11/11/13 11:51 AM      Result Value Ref Range Status   Streptococcus, Group A Screen (Direct) NEGATIVE  NEGATIVE Final   Comment: (NOTE)     A Rapid Antigen test may result negative if the antigen level in the     sample is below the detection level of this test. The FDA has not     cleared this test as a stand-alone test therefore the rapid antigen     negative result has reflexed to a Group A Strep culture.  CULTURE, GROUP A STREP     Status: None   Collection Time    11/11/13 11:51 AM      Result Value Ref Range Status   Specimen Description THROAT   Final   Special Requests NONE   Final   Culture     Final   Value: No Beta Hemolytic Streptococci Isolated     Performed at [facility]   Report Status 11/13/2013 FINAL   Final  MRSA PCR SCREENING     Status: None   Collection Time    11/11/13  7:02 PM      Result Value Ref Range Status   MRSA by PCR NEGATIVE  NEGATIVE Final   Comment:            The GeneXpert MRSA Assay (FDA     approved for NASAL specimens     only), is one component of a     comprehensive MRSA colonization     surveillance program. It is not     intended to diagnose MRSA     infection nor to guide or     monitor treatment for     MRSA infections.  CULTURE, EXPECTORATED SPUTUM-ASSESSMENT     Status: None   Collection Time    11/13/13  4:03 PM      Result Value Ref Range Status   Specimen Description SPUTUM   Final   Special Requests Immunocompromised   Final   Sputum evaluation     Final   Value: THIS SPECIMEN IS ACCEPTABLE. RESPIRATORY CULTURE REPORT TO FOLLOW.   Report Status 11/13/2013 FINAL   Final  CULTURE, RESPIRATORY (NON-EXPECTORATED)     Status: None   Collection Time    11/13/13  4:03 PM      Result Value Ref Range Status   Specimen Description SPUTUM   Final   Special Requests NONE   Final   Gram Stain     Final   Value: NO WBC SEEN     NO SQUAMOUS  EPITHELIAL CELLS SEEN     NO ORGANISMS SEEN     Performed at [facility]  Culture PENDING   Incomplete   Report Status PENDING   Incomplete    Anti-infectives   Start     Dose/Rate Route Frequency Ordered Stop   11/12/13 0600  ceFEPIme (MAXIPIME) 2 g in dextrose 5 % 50 mL IVPB  Status:  Discontinued     2 g 100 mL/hr over 30 Minutes Intravenous Every 24 hours 11/11/13 0702 11/11/13 1126   11/11/13 2000  vancomycin (VANCOCIN) IVPB 750 mg/150 ml premix  Status:  Discontinued     750 mg 150 mL/hr over 60 Minutes Intravenous Every 12 hours 11/11/13 0705 11/11/13 1126   11/11/13 1400  vancomycin (VANCOCIN) 1,250 mg in sodium chloride 0.9 % 250 mL IVPB  Status:  Discontinued     1,250 mg 166.7 mL/hr over 90 Minutes Intravenous Every 24 hours 11/11/13 1126 11/13/13 1224   11/11/13 1400  piperacillin-tazobactam (ZOSYN) IVPB 3.375 g     3.375 g 12.5 mL/hr over 240 Minutes Intravenous 3 times per day 11/11/13 1126     11/11/13 0615  ceFEPIme (MAXIPIME) 2 g in dextrose 5 % 50 mL IVPB     2 g 100 mL/hr over 30 Minutes Intravenous  Once 11/11/13 0610 11/11/13 0703   11/11/13 0615  vancomycin (VANCOCIN) IVPB 1000 mg/200 mL premix     1,000 mg 200 mL/hr over 60 Minutes Intravenous  Once 11/11/13 0610 11/11/13 0814      Assessment: 36 YOF recently discharged from [facility], presents with confusion and fever.  Vancomycin and cefepime initially ordered, changed to vancomycin and zosyn,  Vancomycin discontinued on 4/27( 3 days of Vanc) Continue Zosyn. Repeat CXR suggestive of pneumonia, cultures negative so far, clinically improving.     Goal of Therapy:  Dose zosyn per renal function  Plan:   Zosyn 3.375gm IV q8h over 4h infusion    Dolly Rias RPh 11/14/2013, 9:44 AM Pager [phone]

## 2013-11-14 NOTE — Progress Notes (Addendum)
[name] PROGRESS NOTE  [facility] [id number] DOB: 22-Jun-1941 DOA: 11/11/2013 PCP: Noralee Space, MD Brief Narrative [name] is a 73 y.o. female with PMH of COPD, MDS, and plasma cell dyscrasia, Afib on coumadin was brought in by her husband for fever and confusion. She was discharged from [facility] 2 weeks ago after treatment for COPD exacerbation/Pneumonia. In ER, she is found to be febrile at 102. 5.She is on Revlimid for MDS which was started 2 days ago. Repeat CXR was concerning for pneumonia and by  Next day of admission was coughing up copious amount of thick greenish sputum too. Slowly improving with Abx   Assessment/Plan: 1. Sepsis/HCAP -repeat CXR day 2 of admission, suggestive of pneumonia and correlates with symptoms -continue Zosyn Day 3, stopped Vanc 4/27 -cultures negative so far -cut down IVF -clinically improving, but febrile last pm  2. Metabolic encephalopathy -due to 1 -improved  3. COPD (chronic obstructive pulmonary disease)  - no exacerbation - continue nebs PRN   4. MDS (myelodysplastic syndrome) with 5q deletion  -and concomitant Plasma cell dyscrasia  - D/w Dr.Mohamed recommends holding Revlimid for now  -anemia/thrombocytopenia-monitor, platelet counts improving a little  5. A-fib with RVR  - rate improved with hydration  - overnight 4/26 with couple of 2s pauses, hence 4/27 cut down metoprolol to 25mg  BID, but last pm HR up to 140s will increase BB back to 37.5mg  BID, usual home dose is 50mg  BID, monitor on tele - coumadin per pharmacy   6. Elevated INR  -improved - coumadin per pharmacy  DVT proph: on chronic anticoagulation  Ambulate, PT  Code Status: Partial Code Family Communication: none at bedside Disposition Plan: home when improved   Antibiotics:  Vanc/Zosyn  HPI/Subjective: Coughing a lot greenish phlegm since yesterday, febrile last pm  Objective: Filed Vitals:   11/14/13 1350  BP: 105/51   Pulse: 87  Temp: 97.8 F (36.6 C)  Resp: 18    Intake/Output Summary (Last 24 hours) at 11/14/13 1457 Last data filed at 11/14/13 1304  Gross per 24 hour  Intake   1945 ml  Output    750 ml  Net   1195 ml   Filed Weights   11/12/13 0436 11/13/13 0500 11/14/13 0500  Weight: 92.8 kg (204 lb 9.4 oz) 93.1 kg (205 lb 4 oz) 94.1 kg (207 lb 7.3 oz)    Exam:   General: AAOx3  Cardiovascular: S1S2/RRR  Respiratory: scattered ronchi  Abdomen: soft, Nt, BS present  Musculoskeletal: no edema c/c  Data Reviewed: Basic Metabolic Panel:  Recent Labs Lab 11/08/13 0640 11/11/13 0614 11/12/13 0823 11/13/13 0314 11/14/13 0235 11/14/13 0420  NA 138 137 141 141 136*  --   K 4.4 4.6 3.9 4.2 4.5  --   CL 99 100 103 106 101  --   CO2 24 23 25 22 21   --   GLUCOSE 102* 102* 90 112* 90  --   BUN 21 17 16 16 12   --   CREATININE 1.49* 1.34* 1.26* 1.21* 1.14*  --   CALCIUM 9.0 8.7 7.6* 7.7* 7.9*  --   MG  --   --   --   --   --  1.6   Liver Function Tests:  Recent Labs Lab 11/08/13 0640 11/11/13 0614  AST 33 37  ALT 13 16  ALKPHOS 143* 148*  BILITOT 0.8 0.9  PROT 7.3 7.1  ALBUMIN 3.0* 2.9*   No results found for this basename: LIPASE, AMYLASE,  in  the last 168 hours No results found for this basename: AMMONIA,  in the last 168 hours CBC:  Recent Labs Lab 11/08/13 0640 11/11/13 0614 11/12/13 0823 11/13/13 0314 11/13/13 1746 11/14/13 0235  WBC 5.6 5.0 3.7* 3.2*  --  4.5  NEUTROABS 1.1* 1.0*  --   --   --  0.9*  HGB 9.4* 8.4* 7.0* 6.9* 8.6* 8.2*  HCT 29.6* 26.2* 21.5* 22.3* 26.8* 26.0*  MCV 97.7 97.0 97.3 98.2  --  96.3  PLT 74* 65* 34* 28*  --  45*   Cardiac Enzymes:  Recent Labs Lab 11/11/13 2122 11/12/13 0210 11/12/13 0823  TROPONINI <0.30 <0.30 <0.30   BNP (last 3 results)  Recent Labs  09/03/13 1500 09/21/13 1125 10/22/13 0844  PROBNP 14053.0* 203.0* 5985.0*   CBG:  Recent Labs Lab 11/08/13 0643  GLUCAP 99    Recent Results (from the  past 240 hour(s))  CULTURE, BLOOD (ROUTINE X 2)     Status: None   Collection Time    11/11/13  6:26 AM      Result Value Ref Range Status   Specimen Description BLOOD RIGHT WRIST   Final   Special Requests BOTTLES DRAWN AEROBIC AND ANAEROBIC 5CC EACH   Final   Culture  Setup Time     Final   Value: 11/11/2013 11:20     Performed at [facility]   Culture     Final   Value:        BLOOD CULTURE RECEIVED NO GROWTH TO DATE CULTURE WILL BE HELD FOR 5 DAYS BEFORE ISSUING A FINAL NEGATIVE REPORT     Performed at [facility]   Report Status PENDING   Incomplete  CULTURE, BLOOD (ROUTINE X 2)     Status: None   Collection Time    11/11/13  6:35 AM      Result Value Ref Range Status   Specimen Description BLOOD LEFT HAND   Final   Special Requests BOTTLES DRAWN AEROBIC AND ANAEROBIC 5CC EACH   Final   Culture  Setup Time     Final   Value: 11/11/2013 11:20     Performed at [facility]   Culture     Final   Value:        BLOOD CULTURE RECEIVED NO GROWTH TO DATE CULTURE WILL BE HELD FOR 5 DAYS BEFORE ISSUING A FINAL NEGATIVE REPORT     Performed at [facility]   Report Status PENDING   Incomplete  URINE CULTURE     Status: None   Collection Time    11/11/13  7:29 AM      Result Value Ref Range Status   Specimen Description URINE, CLEAN CATCH   Final   Special Requests NONE   Final   Culture  Setup Time     Final   Value: 11/11/2013 12:18     Performed at [place]     Final   Value: 4,000 COLONIES/ML     Performed at [facility]   Culture     Final   Value: INSIGNIFICANT GROWTH     Performed at [facility]   Report Status 11/12/2013 FINAL   Final  RAPID STREP SCREEN     Status: None   Collection Time    11/11/13 11:51 AM      Result Value Ref Range Status   Streptococcus, Group A Screen (Direct) NEGATIVE  NEGATIVE Final  Comment: (NOTE)     A Rapid Antigen test may result negative if the antigen  level in the     sample is below the detection level of this test. The FDA has not     cleared this test as a stand-alone test therefore the rapid antigen     negative result has reflexed to a Group A Strep culture.  CULTURE, GROUP A STREP     Status: None   Collection Time    11/11/13 11:51 AM      Result Value Ref Range Status   Specimen Description THROAT   Final   Special Requests NONE   Final   Culture     Final   Value: No Beta Hemolytic Streptococci Isolated     Performed at [facility]   Report Status 11/13/2013 FINAL   Final  MRSA PCR SCREENING     Status: None   Collection Time    11/11/13  7:02 PM      Result Value Ref Range Status   MRSA by PCR NEGATIVE  NEGATIVE Final   Comment:            The GeneXpert MRSA Assay (FDA     approved for NASAL specimens     only), is one component of a     comprehensive MRSA colonization     surveillance program. It is not     intended to diagnose MRSA     infection nor to guide or     monitor treatment for     MRSA infections.  CULTURE, EXPECTORATED SPUTUM-ASSESSMENT     Status: None   Collection Time    11/13/13  4:03 PM      Result Value Ref Range Status   Specimen Description SPUTUM   Final   Special Requests Immunocompromised   Final   Sputum evaluation     Final   Value: THIS SPECIMEN IS ACCEPTABLE. RESPIRATORY CULTURE REPORT TO FOLLOW.   Report Status 11/13/2013 FINAL   Final  CULTURE, RESPIRATORY (NON-EXPECTORATED)     Status: None   Collection Time    11/13/13  4:03 PM      Result Value Ref Range Status   Specimen Description SPUTUM   Final   Special Requests NONE   Final   Gram Stain     Final   Value: NO WBC SEEN     NO SQUAMOUS EPITHELIAL CELLS SEEN     NO ORGANISMS SEEN     Performed at [facility]   Culture     Final   Value: Culture reincubated for better growth     Performed at [facility]   Report Status PENDING   Incomplete     Studies: Dg Chest Port 1 View  11/14/2013    CLINICAL DATA:  Fever and confusion.  Concern for pneumonia.  EXAM: PORTABLE CHEST - 1 VIEW  COMPARISON:  Chest radiograph performed 11/12/2013  FINDINGS: The lungs are well expanded. Vascular congestion is noted. Patchy bilateral airspace opacities are seen, worse on the left. This may reflect pneumonia or pulmonary edema. No definite pleural effusion or pneumothorax is seen. The left costophrenic angle is incompletely imaged on this study.  The cardiomediastinal silhouette remains normal in size. The patient is status post median sternotomy. There is a chronic fracture of the second superiormost sternal wire. No acute osseous abnormalities are seen.  IMPRESSION: Vascular congestion noted. Patchy bilateral airspace opacities, worse on the left. This may reflect  pneumonia or pulmonary edema.   Electronically Signed   By: Garald Balding M.D.   On: 11/14/2013 03:31    Scheduled Meds: . ALPRAZolam  0.25-0.5 mg Oral TID  . aspirin EC  81 mg Oral QPC supper  . ferrous sulfate  325 mg Oral QPM  . gabapentin  600 mg Oral TID  . guaiFENesin  1,200 mg Oral BID  . metoprolol  37.5 mg Oral BID  . mometasone-formoterol  2 puff Inhalation BID  . nortriptyline  50 mg Oral QHS  . pantoprazole  40 mg Oral Daily  . piperacillin-tazobactam (ZOSYN)  IV  3.375 g Intravenous 3 times per day  . potassium chloride SA  20 mEq Oral BID  . simvastatin  20 mg Oral QHS  . sodium chloride  3 mL Intravenous Q12H  . warfarin  2.5 mg Oral ONCE-1800  . Warfarin - Pharmacist Dosing Inpatient   Does not apply q1800   Continuous Infusions:   Antibiotics Given (last 72 hours)   Date/Time Action Medication Dose Rate   11/11/13 2206 Given   piperacillin-tazobactam (ZOSYN) IVPB 3.375 g 3.375 g 12.5 mL/hr   11/12/13 0521 Given   piperacillin-tazobactam (ZOSYN) IVPB 3.375 g 3.375 g 12.5 mL/hr   11/12/13 1403 Given   vancomycin (VANCOCIN) 1,250 mg in sodium chloride 0.9 % 250 mL IVPB 1,250 mg 166.7 mL/hr   11/12/13 1403 Given    piperacillin-tazobactam (ZOSYN) IVPB 3.375 g 3.375 g 12.5 mL/hr   11/12/13 2251 Given   piperacillin-tazobactam (ZOSYN) IVPB 3.375 g 3.375 g 12.5 mL/hr   11/13/13 5701 Given   piperacillin-tazobactam (ZOSYN) IVPB 3.375 g 3.375 g 12.5 mL/hr   11/13/13 1423 Given   piperacillin-tazobactam (ZOSYN) IVPB 3.375 g 3.375 g 12.5 mL/hr   11/13/13 2044 Given   piperacillin-tazobactam (ZOSYN) IVPB 3.375 g 3.375 g 12.5 mL/hr   11/14/13 7793 Given   piperacillin-tazobactam (ZOSYN) IVPB 3.375 g 3.375 g 12.5 mL/hr   11/14/13 1304 Given   piperacillin-tazobactam (ZOSYN) IVPB 3.375 g 3.375 g 12.5 mL/hr      Principal Problem:   SIRS (systemic inflammatory response syndrome) Active Problems:   COPD (chronic obstructive pulmonary disease)   CKD (chronic kidney disease), stage III   Atrial fibrillation with RVR   MDS (myelodysplastic syndrome) with 5q deletion   Plasma cell dyscrasia   Encephalopathy acute   Acute encephalopathy   Fever, unspecified   Sepsis    Time spent: 74min    Vernette Moise  [name] Pager [phone]. If 7PM-7AM, please contact night-coverage at [email or web address], password [facility] 11/14/2013, 2:57 PM  LOS: 3 days

## 2013-11-14 NOTE — Progress Notes (Signed)
ANTICOAGULATION CONSULT NOTE - Follow Up Consult  Pharmacy Consult for warfarin Indication: atrial fibrillation  No Known Allergies  Patient Measurements: Height: 5' 7.5" (171.5 cm) Weight: 207 lb 7.3 oz (94.1 kg) IBW/kg (Calculated) : 62.75    Vital Signs: Temp: 97.8 F (36.6 C) (04/28 0607) Temp src: Oral (04/28 0607) BP: 117/75 mmHg (04/28 0607) Pulse Rate: 88 (04/28 0607)  Labs:  Recent Labs  11/11/13 2122 11/12/13 0210  11/12/13 4403 11/13/13 0314 11/13/13 1746 11/14/13 0235 11/14/13 0255  HGB  --   --   < > 7.0* 6.9* 8.6* 8.2*  --   HCT  --   --   < > 21.5* 22.3* 26.8* 26.0*  --   PLT  --   --   --  34* 28*  --  45*  --   LABPROT  --  31.8*  --   --  30.6*  --   --  25.8*  INR  --  3.23*  --   --  3.07*  --   --  2.45*  CREATININE  --   --   --  1.26* 1.21*  --  1.14*  --   TROPONINI <0.30 <0.30  --  <0.30  --   --   --   --   < > = values in this interval not displayed.  Estimated Creatinine Clearance: 53 ml/min (by C-G formula based on Cr of 1.14).   Assessment: 54 yof known to pharmacy from previous admissions, on warfarin for h/o afib  Per anti-coag visit from 4/24: INR was 5.1 with plans to hold warfarin today, Sun and Mon then start warfarin 2.5mg  daily with 5mg  on SunWed INR today = 2.45 , last warfarin dose was 4/24 CBC: 4/27 Hgb = 6.9, plts = 28 (pt. has MDS)  Goal of Therapy:  INR 2-3 Monitor platelets by anticoagulation protocol: Yes   Plan:   INR=2.45, give 2.5mg  warfarin tonight @ 1800   Follow-up with daily INR in am  Dolly Rias RPh 11/14/2013, 9:00 AM Pager [phone]

## 2013-11-15 DIAGNOSIS — J9600 Acute respiratory failure, unspecified whether with hypoxia or hypercapnia: Secondary | ICD-10-CM

## 2013-11-15 LAB — BASIC METABOLIC PANEL
BUN: 13 mg/dL (ref 6–23)
CHLORIDE: 106 meq/L (ref 96–112)
CO2: 23 mEq/L (ref 19–32)
Calcium: 8.5 mg/dL (ref 8.4–10.5)
Creatinine, Ser: 1.13 mg/dL — ABNORMAL HIGH (ref 0.50–1.10)
GFR calc Af Amer: 55 mL/min — ABNORMAL LOW (ref >90)
GFR calc non Af Amer: 47 mL/min — ABNORMAL LOW (ref >90)
GLUCOSE: 94 mg/dL (ref 70–99)
POTASSIUM: 4.4 meq/L (ref 3.7–5.3)
Sodium: 140 mEq/L (ref 137–147)

## 2013-11-15 LAB — PROTIME-INR
INR: 2.34 — AB (ref 0.00–1.49)
Prothrombin Time: 24.9 seconds — ABNORMAL HIGH (ref 11.6–15.2)

## 2013-11-15 LAB — CBC
HEMATOCRIT: 25.4 % — AB (ref 36.0–46.0)
HEMOGLOBIN: 7.9 g/dL — AB (ref 12.0–15.0)
MCH: 30 pg (ref 26.0–34.0)
MCHC: 31.1 g/dL (ref 30.0–36.0)
MCV: 96.6 fL (ref 78.0–100.0)
Platelets: 42 10*3/uL — ABNORMAL LOW (ref 150–400)
RBC: 2.63 MIL/uL — AB (ref 3.87–5.11)
RDW: 20.5 % — ABNORMAL HIGH (ref 11.5–15.5)
WBC: 4.5 10*3/uL (ref 4.0–10.5)

## 2013-11-15 MED ORDER — METOPROLOL TARTRATE 50 MG PO TABS
50.0000 mg | ORAL_TABLET | Freq: Two times a day (BID) | ORAL | Status: DC
  Filled 2013-11-15: qty 1

## 2013-11-15 MED ORDER — METOPROLOL TARTRATE 50 MG PO TABS
50.0000 mg | ORAL_TABLET | Freq: Two times a day (BID) | ORAL | Status: DC
  Administered 2013-11-15 – 2013-11-17 (×4): 50 mg via ORAL
  Filled 2013-11-15 (×6): qty 1

## 2013-11-15 MED ORDER — FUROSEMIDE 40 MG PO TABS
40.0000 mg | ORAL_TABLET | Freq: Two times a day (BID) | ORAL | Status: DC
  Administered 2013-11-15 – 2013-11-17 (×4): 40 mg via ORAL
  Filled 2013-11-15 (×6): qty 1

## 2013-11-15 MED ORDER — ALPRAZOLAM 0.25 MG PO TABS
0.2500 mg | ORAL_TABLET | Freq: Four times a day (QID) | ORAL | Status: DC
  Administered 2013-11-15 – 2013-11-16 (×2): 0.25 mg via ORAL
  Administered 2013-11-16: 0.5 mg via ORAL
  Administered 2013-11-16 – 2013-11-17 (×2): 0.25 mg via ORAL
  Filled 2013-11-15 (×5): qty 1
  Filled 2013-11-15: qty 2

## 2013-11-15 MED ORDER — WARFARIN SODIUM 2.5 MG PO TABS
2.5000 mg | ORAL_TABLET | Freq: Once | ORAL | Status: AC
  Administered 2013-11-15: 2.5 mg via ORAL
  Filled 2013-11-15: qty 1

## 2013-11-15 NOTE — Progress Notes (Signed)
ANTICOAGULATION CONSULT NOTE - Follow Up   Pharmacy Consult for warfarin Indication: atrial fibrillation  No Known Allergies  Patient Measurements: Height: 5' 7.5" (171.5 cm) Weight: 207 lb 10.8 oz (94.2 kg) IBW/kg (Calculated) : 62.75   Vital Signs: Temp: 97.5 F (36.4 C) (04/29 0506) Temp src: Oral (04/29 0506) BP: 133/69 mmHg (04/29 0818) Pulse Rate: 131 (04/29 0818)  Labs:  Recent Labs  11/13/13 0314 11/13/13 1746 11/14/13 0235 11/14/13 0255 11/15/13 0510  HGB 6.9* 8.6* 8.2*  --  7.9*  HCT 22.3* 26.8* 26.0*  --  25.4*  PLT 28*  --  45*  --  42*  LABPROT 30.6*  --   --  25.8* 24.9*  INR 3.07*  --   --  2.45* 2.34*  CREATININE 1.21*  --  1.14*  --  1.13*   Assessment: 59 yof known to pharmacy from previous admissions, on warfarin for h/o afib  Per anti-coag visit from 4/24: INR was 5.1 with plans to hold warfarin today, Sun and Mon then start warfarin 2.5mg  daily with 5mg  on SunWed INR today = 2.34  Warfarin resumed 4/28 with 2.5mg  dose CBC: low (pt. has MDS)  Goal of Therapy:  INR 2-3   Plan:   Warfarin 2.5mg  today   Follow-up with daily INR in am  Minda Ditto PharmD Pager [phone] 11/15/2013, 12:42 PM

## 2013-11-15 NOTE — Progress Notes (Signed)
[name] PROGRESS NOTE  [name] BPZ:[id number] DOB: 19-May-1941 DOA: 11/11/2013 PCP: Noralee Space, MD Brief Narrative [name] is a 73 y.o. female with PMH of COPD, MDS, and plasma cell dyscrasia, Afib on coumadin was brought in by her husband for fever and confusion. She was discharged from [facility] 2 weeks ago after treatment for COPD exacerbation/Pneumonia. In ER, she is found to be febrile at 102. 5.She is on Revlimid for MDS which was started 2 days ago. Repeat CXR was concerning for pneumonia and by  Next day of admission was coughing up copious amount of thick greenish sputum too. Slowly improving with Abx   Assessment/Plan: 1. Sepsis/HCAP -repeat CXR day 2 of admission, suggestive of pneumonia and bilateral air space opacities. Pulmonary edema.  -continue Zosyn Day 4, stopped Vanc 4/27 -cultures negative so far -cut down IVF and start her on lasix.  -clinically improving, 2. Metabolic encephalopathy -due to 1 -improved  3. COPD (chronic obstructive pulmonary disease)  - no exacerbation - continue nebs PRN   4. MDS (myelodysplastic syndrome) with 5q deletion  -and concomitant Plasma cell dyscrasia  - D/w Dr.Mohamed recommends holding Revlimid for now  -anemia/thrombocytopenia-monitor, platelet Bowers improving a little  5. A-fib with RVR  - rate improved with hydration  - overnight 4/26 with couple of 2s pauses, hence 4/27 cut down metoprolol to 25mg  BID, but last pm HR up to 140s will increase BB back to 37.5mg  BID, usual home dose is 50mg  BID, monitor on tele - coumadin per pharmacy   6. Elevated INR  -improved - coumadin per pharmacy  DVT proph: on chronic anticoagulation  Ambulate, PT  Code Status: Partial Code Family Communication: none at bedside Disposition Plan: home when improved   Antibiotics: Vanc till 4/27 Zosyn 4/25 HPI/Subjective: Coughing improved, still remains on oxygen.   Objective: Filed Vitals:   11/15/13 1436   BP: 96/70  Pulse: 111  Temp: 98.1 F (36.7 C)  Resp: 20    Intake/Output Summary (Last 24 hours) at 11/15/13 1745 Last data filed at 11/15/13 1532  Gross per 24 hour  Intake   1710 ml  Output      0 ml  Net   1710 ml   Filed Weights   11/13/13 0500 11/14/13 0500 11/15/13 0506  Weight: 93.1 kg (205 lb 4 oz) 94.1 kg (207 lb 7.3 oz) 94.2 kg (207 lb 10.8 oz)    Exam:   General: AAOx3  Cardiovascular: S1S2/RRR  Respiratory: scattered ronchi  Abdomen: soft, Nt, BS present  Musculoskeletal: no edema c/c  Data Reviewed: Basic Metabolic Panel:  Recent Labs Lab 11/11/13 0614 11/12/13 0823 11/13/13 0314 11/14/13 0235 11/14/13 0420 11/15/13 0510  NA 137 141 141 136*  --  140  K 4.6 3.9 4.2 4.5  --  4.4  CL 100 103 106 101  --  106  CO2 23 25 22 21   --  23  GLUCOSE 102* 90 112* 90  --  94  BUN 17 16 16 12   --  13  CREATININE 1.34* 1.26* 1.21* 1.14*  --  1.13*  CALCIUM 8.7 7.6* 7.7* 7.9*  --  8.5  MG  --   --   --   --  1.6  --    Liver Function Tests:  Recent Labs Lab 11/11/13 0614  AST 37  ALT 16  ALKPHOS 148*  BILITOT 0.9  PROT 7.1  ALBUMIN 2.9*   No results found for this basename: LIPASE, AMYLASE,  in the last  168 hours No results found for this basename: AMMONIA,  in the last 168 hours CBC:  Recent Labs Lab 11/11/13 0614 11/12/13 0823 11/13/13 0314 11/13/13 1746 11/14/13 0235 11/15/13 0510  WBC 5.0 3.7* 3.2*  --  4.5 4.5  NEUTROABS 1.0*  --   --   --  0.9*  --   HGB 8.4* 7.0* 6.9* 8.6* 8.2* 7.9*  HCT 26.2* 21.5* 22.3* 26.8* 26.0* 25.4*  MCV 97.0 97.3 98.2  --  96.3 96.6  PLT 65* 34* 28*  --  45* 42*   Cardiac Enzymes:  Recent Labs Lab 11/11/13 2122 11/12/13 0210 11/12/13 0823  TROPONINI <0.30 <0.30 <0.30   BNP (last 3 results)  Recent Labs  09/03/13 1500 09/21/13 1125 10/22/13 0844  PROBNP 14053.0* 203.0* 5985.0*   CBG: No results found for this basename: GLUCAP,  in the last 168 hours  Recent Results (from the past  240 hour(s))  CULTURE, BLOOD (ROUTINE X 2)     Status: None   Collection Time    11/11/13  6:26 AM      Result Value Ref Range Status   Specimen Description BLOOD RIGHT WRIST   Final   Special Requests BOTTLES DRAWN AEROBIC AND ANAEROBIC 5CC EACH   Final   Culture  Setup Time     Final   Value: 11/11/2013 11:20     Performed at [facility]   Culture     Final   Value:        BLOOD CULTURE RECEIVED NO GROWTH TO DATE CULTURE WILL BE HELD FOR 5 DAYS BEFORE ISSUING A FINAL NEGATIVE REPORT     Performed at [facility]   Report Status PENDING   Incomplete  CULTURE, BLOOD (ROUTINE X 2)     Status: None   Collection Time    11/11/13  6:35 AM      Result Value Ref Range Status   Specimen Description BLOOD LEFT HAND   Final   Special Requests BOTTLES DRAWN AEROBIC AND ANAEROBIC 5CC EACH   Final   Culture  Setup Time     Final   Value: 11/11/2013 11:20     Performed at [facility]   Culture     Final   Value:        BLOOD CULTURE RECEIVED NO GROWTH TO DATE CULTURE WILL BE HELD FOR 5 DAYS BEFORE ISSUING A FINAL NEGATIVE REPORT     Performed at [facility]   Report Status PENDING   Incomplete  URINE CULTURE     Status: None   Collection Time    11/11/13  7:29 AM      Result Value Ref Range Status   Specimen Description URINE, CLEAN CATCH   Final   Special Requests NONE   Final   Culture  Setup Time     Final   Value: 11/11/2013 12:18     Performed at [facility]     Final   Value: 4,000 COLONIES/ML     Performed at [facility]   Culture     Final   Value: INSIGNIFICANT GROWTH     Performed at [facility]   Report Status 11/12/2013 FINAL   Final  RAPID STREP SCREEN     Status: None   Collection Time    11/11/13 11:51 AM      Result Value Ref Range Status   Streptococcus, Group A Screen (Direct) NEGATIVE  NEGATIVE  Final   Comment: (NOTE)     A Rapid Antigen test may result negative if the antigen level in  the     sample is below the detection level of this test. The FDA has not     cleared this test as a stand-alone test therefore the rapid antigen     negative result has reflexed to a Group A Strep culture.  CULTURE, GROUP A STREP     Status: None   Collection Time    11/11/13 11:51 AM      Result Value Ref Range Status   Specimen Description THROAT   Final   Special Requests NONE   Final   Culture     Final   Value: No Beta Hemolytic Streptococci Isolated     Performed at [facility]   Report Status 11/13/2013 FINAL   Final  MRSA PCR SCREENING     Status: None   Collection Time    11/11/13  7:02 PM      Result Value Ref Range Status   MRSA by PCR NEGATIVE  NEGATIVE Final   Comment:            The GeneXpert MRSA Assay (FDA     approved for NASAL specimens     only), is one component of a     comprehensive MRSA colonization     surveillance program. It is not     intended to diagnose MRSA     infection nor to guide or     monitor treatment for     MRSA infections.  CULTURE, EXPECTORATED SPUTUM-ASSESSMENT     Status: None   Collection Time    11/13/13  4:03 PM      Result Value Ref Range Status   Specimen Description SPUTUM   Final   Special Requests Immunocompromised   Final   Sputum evaluation     Final   Value: THIS SPECIMEN IS ACCEPTABLE. RESPIRATORY CULTURE REPORT TO FOLLOW.   Report Status 11/13/2013 FINAL   Final  CULTURE, RESPIRATORY (NON-EXPECTORATED)     Status: None   Collection Time    11/13/13  4:03 PM      Result Value Ref Range Status   Specimen Description SPUTUM   Final   Special Requests NONE   Final   Gram Stain     Final   Value: NO WBC SEEN     NO SQUAMOUS EPITHELIAL CELLS SEEN     NO ORGANISMS SEEN     Performed at [facility]   Culture     Final   Value: RARE FUNGUS (MOLD) ISOLATED, PROBABLE CONTAMINANT/COLONIZER (SAPROPHYTE). CONTACT MICROBIOLOGY IF FURTHER IDENTIFICATION REQUIRED [phone].     Performed at [facility]   Report Status PENDING   Incomplete  CULTURE, BLOOD (ROUTINE X 2)     Status: None   Collection Time    11/14/13  2:50 AM      Result Value Ref Range Status   Specimen Description BLOOD RIGHT ARM   Final   Special Requests BOTTLES DRAWN AEROBIC AND ANAEROBIC 5CC   Final   Culture  Setup Time     Final   Value: 11/14/2013 09:35     Performed at [facility]   Culture     Final   Value:        BLOOD CULTURE RECEIVED NO GROWTH TO DATE CULTURE WILL BE HELD FOR 5 DAYS BEFORE ISSUING A FINAL NEGATIVE REPORT  Performed at [facility]   Report Status PENDING   Incomplete  CULTURE, BLOOD (ROUTINE X 2)     Status: None   Collection Time    11/14/13  2:55 AM      Result Value Ref Range Status   Specimen Description BLOOD RIGHT HAND   Final   Special Requests BOTTLES DRAWN AEROBIC ONLY 3CC   Final   Culture  Setup Time     Final   Value: 11/14/2013 09:35     Performed at [facility]   Culture     Final   Value:        BLOOD CULTURE RECEIVED NO GROWTH TO DATE CULTURE WILL BE HELD FOR 5 DAYS BEFORE ISSUING A FINAL NEGATIVE REPORT     Performed at [facility]   Report Status PENDING   Incomplete     Studies: Dg Chest Port 1 View  11/14/2013   CLINICAL DATA:  Fever and confusion.  Concern for pneumonia.  EXAM: PORTABLE CHEST - 1 VIEW  COMPARISON:  Chest radiograph performed 11/12/2013  FINDINGS: The lungs are well expanded. Vascular congestion is noted. Patchy bilateral airspace opacities are seen, worse on the left. This may reflect pneumonia or pulmonary edema. No definite pleural effusion or pneumothorax is seen. The left costophrenic angle is incompletely imaged on this study.  The cardiomediastinal silhouette remains normal in size. The patient is status post median sternotomy. There is a chronic fracture of the second superiormost sternal wire. No acute osseous abnormalities are seen.  IMPRESSION: Vascular congestion noted. Patchy bilateral  airspace opacities, worse on the left. This may reflect pneumonia or pulmonary edema.   Electronically Signed   By: Garald Balding M.D.   On: 11/14/2013 03:31    Scheduled Meds: . ALPRAZolam  0.25-0.5 mg Oral QID  . aspirin EC  81 mg Oral QPC supper  . ferrous sulfate  325 mg Oral QPM  . furosemide  40 mg Oral BID  . gabapentin  600 mg Oral TID  . guaiFENesin  1,200 mg Oral BID  . metoprolol tartrate  50 mg Oral BID  . mometasone-formoterol  2 puff Inhalation BID  . nortriptyline  50 mg Oral QHS  . pantoprazole  40 mg Oral Daily  . piperacillin-tazobactam (ZOSYN)  IV  3.375 g Intravenous 3 times per day  . potassium chloride SA  20 mEq Oral BID  . simvastatin  20 mg Oral QHS  . sodium chloride  3 mL Intravenous Q12H  . warfarin  2.5 mg Oral ONCE-1800  . Warfarin - Pharmacist Dosing Inpatient   Does not apply q1800   Continuous Infusions:   Antibiotics Given (last 72 hours)   Date/Time Action Medication Dose Rate   11/12/13 2251 Given   piperacillin-tazobactam (ZOSYN) IVPB 3.375 g 3.375 g 12.5 mL/hr   11/13/13 0521 Given   piperacillin-tazobactam (ZOSYN) IVPB 3.375 g 3.375 g 12.5 mL/hr   11/13/13 1423 Given   piperacillin-tazobactam (ZOSYN) IVPB 3.375 g 3.375 g 12.5 mL/hr   11/13/13 2044 Given   piperacillin-tazobactam (ZOSYN) IVPB 3.375 g 3.375 g 12.5 mL/hr   11/14/13 0504 Given   piperacillin-tazobactam (ZOSYN) IVPB 3.375 g 3.375 g 12.5 mL/hr   11/14/13 1304 Given   piperacillin-tazobactam (ZOSYN) IVPB 3.375 g 3.375 g 12.5 mL/hr   11/14/13 2106 Given   piperacillin-tazobactam (ZOSYN) IVPB 3.375 g 3.375 g 12.5 mL/hr   11/15/13 0524 Given   piperacillin-tazobactam (ZOSYN) IVPB 3.375 g 3.375 g 12.5 mL/hr   11/15/13  1343 Given   piperacillin-tazobactam (ZOSYN) IVPB 3.375 g 3.375 g 12.5 mL/hr      Principal Problem:   SIRS (systemic inflammatory response syndrome) Active Problems:   COPD (chronic obstructive pulmonary disease)   CKD (chronic kidney disease), stage III    Atrial fibrillation with RVR   MDS (myelodysplastic syndrome) with 5q deletion   Plasma cell dyscrasia   Encephalopathy acute   Acute encephalopathy   Fever, unspecified   Sepsis    Time spent: 73min    [name]  [name] Pager [phone]. If 7PM-7AM, please contact night-coverage at [email or web address], password [facility] 11/15/2013, 5:45 PM  LOS: 4 days

## 2013-11-16 ENCOUNTER — Inpatient Hospital Stay (HOSPITAL_COMMUNITY): Payer: Medicare Other

## 2013-11-16 ENCOUNTER — Other Ambulatory Visit: Payer: Medicare Other

## 2013-11-16 ENCOUNTER — Ambulatory Visit: Payer: Medicare Other | Admitting: Internal Medicine

## 2013-11-16 LAB — CULTURE, RESPIRATORY: GRAM STAIN: NONE SEEN

## 2013-11-16 LAB — CULTURE, RESPIRATORY W GRAM STAIN

## 2013-11-16 LAB — PREPARE RBC (CROSSMATCH)

## 2013-11-16 LAB — PROTIME-INR
INR: 2.55 — AB (ref 0.00–1.49)
Prothrombin Time: 26.6 seconds — ABNORMAL HIGH (ref 11.6–15.2)

## 2013-11-16 LAB — HEMOGLOBIN AND HEMATOCRIT, BLOOD
HCT: 24.1 % — ABNORMAL LOW (ref 36.0–46.0)
HEMOGLOBIN: 7.7 g/dL — AB (ref 12.0–15.0)

## 2013-11-16 MED ORDER — WARFARIN SODIUM 2.5 MG PO TABS
2.5000 mg | ORAL_TABLET | Freq: Once | ORAL | Status: AC
  Administered 2013-11-16: 2.5 mg via ORAL
  Filled 2013-11-16: qty 1

## 2013-11-16 NOTE — Evaluation (Signed)
Clinical/Bedside Swallow Evaluation Patient Details  Name: [name] MRN: [id number] Date of Birth: 1941-02-22  Today's Date: 11/16/2013 Time: 0626-9485 SLP Time Calculation (min): 32 min  Past Medical History:  Past Medical History  Diagnosis Date  . Coronary atherosclerosis of unspecified type of vessel, native or graft   . Atrial fibrillation 01/19/2012    stress test - non-gated secondary to A fib,  normal myocaridal perfusion study  . Unspecified venous (peripheral) insufficiency   . Other and unspecified hyperlipidemia   . Morbid obesity   . Esophageal reflux   . Irritable bowel syndrome   . Other symptoms involving urinary system(788.99)   . Chronic pain syndrome   . 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 Anxiety state, unspecified   . Herpes zoster without mention of complication   . Herpes zoster with other nervous system complications(053.19)   . Lumbago   . Anticoagulated on Coumadin, chronic for A. Fib 04/08/2012  . COPD (chronic obstructive pulmonary disease)   . 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 Pneumonia 4 years ago  . Bronchitis, chronic   . Fibromyalgia   . Arthritis   . CAD (coronary artery disease) 04/29/2006    echo -EF 45-50%, impaired LV relaxation  . 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 GI bleed 04/07/2012    endoscopy/colonoscopy unremarkable  . 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 DJD (degenerative joint disease)   . Myocardial infarction   . Anemia    Past Surgical History:  Past Surgical History  Procedure Laterality Date  . Vesicovaginal fistula closure w/ tah    . Cardiac catheterization  01/06/2010    RCA mid artery stenosis at 99%, LAD proximal occlusion 90%  at origin of ramus  . Nissen fundoplication    . Esophagogastroduodenoscopy  04/09/2012    Procedure: ESOPHAGOGASTRODUODENOSCOPY (EGD);  Surgeon: Beryle Beams, MD;  Location: [facility]  ENDOSCOPY;  Service: Endoscopy;  Laterality: N/A;  tentatively scheduled per Trula Slade due to patients breathing problems  . Colonoscopy  04/11/2012    Procedure: COLONOSCOPY;  Surgeon: Ladene Artist, MD,FACG;  Location: [facility] ENDOSCOPY;  Service: Endoscopy;  Laterality: N/A;  . Abdominal hysterectomy  at 73 years old  . Appendectomy  about 50 years ago  . Coronary artery bypass graft  1997    LIMA to LAD, SVG to ramus, SVG to RCA  . Cholecystectomy  50 years ago  . Neck surgery  73 years old    full movement  . Right knee arthroscopy  3 years ago  . Eye surgery  2005    cataracts both eyes  . Back surgery      x5, "birdcage" and fused in lower back  . Total knee arthroplasty  06/28/2012    Procedure: TOTAL KNEE ARTHROPLASTY;  Surgeon: Sydnee Cabal, MD;  Location: [facility] ORS;  Service: Orthopedics;  Laterality: Left;  . Joint replacement Left 06/28/12    knee replacement   HPI:  73 yo female adm to [facility] with fever, anemia and was diagnosed with HCAP.  Pt has h/o GERD, MDS, previous smoker with COPD.  CXR 4/30 showed improved CHF, persistent airspace disease and ATX.  Pt CT head 4/30 negative for acute finding.     Assessment / Plan / Recommendation Clinical Impression  Pt with functional oropharyngeal swallow based on clinical swallow evaluation and lack of cranial nerve deficit.  Swallow was timely with clear  voice throughout intake with pt observed consuming saltine cracker, applesauce and water.  Pt does report her mouth being sore - and she uses dentures that she states her husband cleans daily.    Mouth appeared xerostomic and SLP provided water and Biotene for compensation.  Pt admits to eating with HOB lowered and occasionally choking with intake.  She denies reflux symptoms and is on a PPI but admits to SLP education "making sense" after discussion to sit upright.  Pt provided verbal and written education for aspiration mitigation due to her COPD/anemia and SLP used teach back for  reinforcement.    Recommend pt continue regular/thin diet with precautions.  SlP to sign off as all education completed.      Aspiration Risk  Mild    Diet Recommendation Regular;Thin liquid   Liquid Administration via: Cup;Straw Medication Administration: Whole meds with liquid Supervision: Patient able to self feed Compensations: Slow rate;Small sips/bites Postural Changes and/or Swallow Maneuvers: Upright 30-60 min after meal;Seated upright 90 degrees    Other  Recommendations Oral Care Recommendations: Oral care BID   Follow Up Recommendations  None    Frequency and Duration   n/a     Pertinent Vitals/Pain Afebrile, decreased      Swallow Study Prior Functional Status   see hhx, pt denies significant dysphagia pta admits to poor appetite will illness    General Date of Onset: 11/16/13 HPI: 73 yo female adm to [facility] with fever, anemia and was diagnosed with HCAP.  Pt has h/o GERD, MDS, previous smoker with COPD.  CXR 4/30 showed improved CHF, persistent airspace disease and ATX.  Pt CT head 4/30 negative for acute finding.   Type of Study: Bedside swallow evaluation Diet Prior to this Study: Regular;Thin liquids Respiratory Status: Nasal cannula Behavior/Cognition: Alert;Cooperative Oral Cavity - Dentition: Dentures, top;Dentures, bottom Self-Feeding Abilities: Able to feed self Patient Positioning: Upright in bed Baseline Vocal Quality: Clear Volitional Cough: Strong Volitional Swallow: Able to elicit    Oral/Motor/Sensory Function Overall Oral Motor/Sensory Function: Appears within functional limits for tasks assessed   Ice Chips Ice chips: Not tested   Thin Liquid Thin Liquid: Within functional limits Presentation: Straw    Nectar Thick Nectar Thick Liquid: Not tested   Honey Thick Honey Thick Liquid: Not tested   Puree Puree: Within functional limits Presentation: Self Fed;Spoon   Solid   GO    Solid: Within functional limits Presentation: [place], [place] [facility] SLP [phone]

## 2013-11-16 NOTE — Progress Notes (Signed)
ANTICOAGULATION CONSULT NOTE - Follow Up   Pharmacy Consult for warfarin Indication: atrial fibrillation  No Known Allergies  Patient Measurements: Height: 5' 7.5" (171.5 cm) Weight: 205 lb 11 oz (93.3 kg) IBW/kg (Calculated) : 62.75   Vital Signs: Temp: 98.4 F (36.9 C) (04/30 0440) Temp src: Oral (04/30 0440) BP: 98/57 mmHg (04/30 0440) Pulse Rate: 98 (04/30 0440)  Labs:  Recent Labs  11/13/13 1746 11/14/13 0235 11/14/13 0255 11/15/13 0510 11/16/13 0446  HGB 8.6* 8.2*  --  7.9*  --   HCT 26.8* 26.0*  --  25.4*  --   PLT  --  45*  --  42*  --   LABPROT  --   --  25.8* 24.9* 26.6*  INR  --   --  2.45* 2.34* 2.55*  CREATININE  --  1.14*  --  1.13*  --    Assessment: 3 yof known to pharmacy from previous admissions, on warfarin for h/o afib  Per anti-coag visit from 4/24: INR was 5.1 with plans to hold warfarin x 3 days then start warfarin 2.5mg  daily with 5mg  on SunWed Warfarin resumed 4/28, has received 2 doses of 2.5mg  since then INR remains therapeutic 2.55  CBC: low (pt. has MDS)  Goal of Therapy:  INR 2-3   Plan:   Warfarin 2.5mg  today   Daily PT/INR  Peggyann Juba, PharmD, BCPS Pager: [phone]  11/16/2013, 8:45 AM

## 2013-11-16 NOTE — Progress Notes (Signed)
CARE MANAGEMENT NOTE 11/16/2013  Patient:  VEVERLY, LARIMER   Account Number:  [id number]  Date Initiated:  11/16/2013  Documentation initiated by:  Gabriel Earing  Subjective/Objective Assessment:   pt admitted 73 y.o. female with PMH of COPD, MDS, and plasma cell dyscrasia, Afib on coumadin was brought in by her husband for fever and confusion.     Action/Plan:   from home   Anticipated DC Date:  11/17/2013   Anticipated DC Plan:  [place]  CM consult      [facility] Choice  HOME HEALTH   Choice offered to / List presented to:  C-3 Spouse        HH arranged  HH-1 RN  [place] PT      [facility] agency  [facility]   Status of service:  In process, will continue to follow Medicare Important Message given?  NO (If response is "NO", the following Medicare IM given date fields will be blank) Date Medicare IM given:   Date Additional Medicare IM given:    Discharge Disposition:  [facility]  Per UR Regulation:  Reviewed for med. necessity/level of care/duration of stay  If discussed at [place] of Stay Meetings, dates discussed:    Comments:  11/16/13 MMcGibboney, RN, BSN Spoke with pt's house and concerningHHRN/PT. Mr. Sarris states wife is active with [facility] and wil continue with them. A call to [facility] revealed that pt is active with HHR[facility]. [place] orders to [facility] faxed to [phone] fax, office [phone].

## 2013-11-16 NOTE — Progress Notes (Signed)
[name] PROGRESS NOTE  [name] ZDG:[id number] DOB: 1941/06/03 DOA: 11/11/2013 PCP: Noralee Space, MD Brief Narrative [name] is a 73 y.o. female with PMH of COPD, MDS, and plasma cell dyscrasia, Afib on coumadin was brought in by her husband for fever and confusion. She was discharged from [facility] 2 weeks ago after treatment for COPD exacerbation/Pneumonia. In ER, she is found to be febrile at 102. 5.She is on Revlimid for MDS which was started 2 days ago. Repeat CXR was concerning for pneumonia and by  Next day of admission was coughing up copious amount of thick greenish sputum too. Slowly improving with Abx   Assessment/Plan: 1. Sepsis/HCAP -repeat CXR today shows improving CHF and pneumonia -continue Zosyn Day 5, stopped Vanc 4/27 -cultures negative so far -cut down IVF and started her on lasix.  -clinically improving, 2. Metabolic encephalopathy -due to 1 -improved  3. COPD (chronic obstructive pulmonary disease)  - no exacerbation - continue nebs PRN   4. MDS (myelodysplastic syndrome) with 5q deletion  -and concomitant Plasma cell dyscrasia  - D/w Dr.Mohamed recommends holding Revlimid for now  -anemia/thrombocytopenia-monitor, platelet counts improving a little  5. A-fib with RVR  - rate improved with hydration  - arate better controlled on home dose.  - coumadin per pharmacy   6. Elevated INR  -improved - coumadin per pharmacy  DVT proph: on chronic anticoagulation  Ambulate, PT  Code Status: Partial Code Family Communication: none at bedside Disposition Plan: home when improved   Antibiotics: Vanc till 4/27 Zosyn 4/25 HPI/Subjective: Coughing improved, on RA.   Objective: Filed Vitals:   11/16/13 1409  BP: 137/92  Pulse: 83  Temp: 98.3 F (36.8 C)  Resp: 20    Intake/Output Summary (Last 24 hours) at 11/16/13 1514 Last data filed at 11/16/13 1439  Gross per 24 hour  Intake   1703 ml  Output    200 ml  Net   1503  ml   Filed Weights   11/14/13 0500 11/15/13 0506 11/16/13 0440  Weight: 94.1 kg (207 lb 7.3 oz) 94.2 kg (207 lb 10.8 oz) 93.3 kg (205 lb 11 oz)    Exam:   General: AAOx3  Cardiovascular: S1S2/RRR  Respiratory: scattered ronchi  Abdomen: soft, Nt, BS present  Musculoskeletal: no edema c/c  Data Reviewed: Basic Metabolic Panel:  Recent Labs Lab 11/11/13 0614 11/12/13 0823 11/13/13 0314 11/14/13 0235 11/14/13 0420 11/15/13 0510  NA 137 141 141 136*  --  140  K 4.6 3.9 4.2 4.5  --  4.4  CL 100 103 106 101  --  106  CO2 23 25 22 21   --  23  GLUCOSE 102* 90 112* 90  --  94  BUN 17 16 16 12   --  13  CREATININE 1.34* 1.26* 1.21* 1.14*  --  1.13*  CALCIUM 8.7 7.6* 7.7* 7.9*  --  8.5  MG  --   --   --   --  1.6  --    Liver Function Tests:  Recent Labs Lab 11/11/13 0614  AST 37  ALT 16  ALKPHOS 148*  BILITOT 0.9  PROT 7.1  ALBUMIN 2.9*   No results found for this basename: LIPASE, AMYLASE,  in the last 168 hours No results found for this basename: AMMONIA,  in the last 168 hours CBC:  Recent Labs Lab 11/11/13 0614 11/12/13 0823 11/13/13 0314 11/13/13 1746 11/14/13 0235 11/15/13 0510 11/16/13 0446  WBC 5.0 3.7* 3.2*  --  4.5 4.5  --  NEUTROABS 1.0*  --   --   --  0.9*  --   --   HGB 8.4* 7.0* 6.9* 8.6* 8.2* 7.9* 7.7*  HCT 26.2* 21.5* 22.3* 26.8* 26.0* 25.4* 24.1*  MCV 97.0 97.3 98.2  --  96.3 96.6  --   PLT 65* 34* 28*  --  45* 42*  --    Cardiac Enzymes:  Recent Labs Lab 11/11/13 2122 11/12/13 0210 11/12/13 0823  TROPONINI <0.30 <0.30 <0.30   BNP (last 3 results)  Recent Labs  09/03/13 1500 09/21/13 1125 10/22/13 0844  PROBNP 14053.0* 203.0* 5985.0*   CBG: No results found for this basename: GLUCAP,  in the last 168 hours  Recent Results (from the past 240 hour(s))  CULTURE, BLOOD (ROUTINE X 2)     Status: None   Collection Time    11/11/13  6:26 AM      Result Value Ref Range Status   Specimen Description BLOOD RIGHT WRIST    Final   Special Requests BOTTLES DRAWN AEROBIC AND ANAEROBIC 5CC EACH   Final   Culture  Setup Time     Final   Value: 11/11/2013 11:20     Performed at [facility]   Culture     Final   Value:        BLOOD CULTURE RECEIVED NO GROWTH TO DATE CULTURE WILL BE HELD FOR 5 DAYS BEFORE ISSUING A FINAL NEGATIVE REPORT     Performed at [facility]   Report Status PENDING   Incomplete  CULTURE, BLOOD (ROUTINE X 2)     Status: None   Collection Time    11/11/13  6:35 AM      Result Value Ref Range Status   Specimen Description BLOOD LEFT HAND   Final   Special Requests BOTTLES DRAWN AEROBIC AND ANAEROBIC 5CC EACH   Final   Culture  Setup Time     Final   Value: 11/11/2013 11:20     Performed at [facility]   Culture     Final   Value:        BLOOD CULTURE RECEIVED NO GROWTH TO DATE CULTURE WILL BE HELD FOR 5 DAYS BEFORE ISSUING A FINAL NEGATIVE REPORT     Performed at [facility]   Report Status PENDING   Incomplete  URINE CULTURE     Status: None   Collection Time    11/11/13  7:29 AM      Result Value Ref Range Status   Specimen Description URINE, CLEAN CATCH   Final   Special Requests NONE   Final   Culture  Setup Time     Final   Value: 11/11/2013 12:18     Performed at [facility]     Final   Value: 4,000 COLONIES/ML     Performed at [facility]   Culture     Final   Value: INSIGNIFICANT GROWTH     Performed at [facility]   Report Status 11/12/2013 FINAL   Final  RAPID STREP SCREEN     Status: None   Collection Time    11/11/13 11:51 AM      Result Value Ref Range Status   Streptococcus, Group A Screen (Direct) NEGATIVE  NEGATIVE Final   Comment: (NOTE)     A Rapid Antigen test may result negative if the antigen level in the     sample is below the detection level  of this test. The FDA has not     cleared this test as a stand-alone test therefore the rapid antigen     negative result has reflexed  to a Group A Strep culture.  CULTURE, GROUP A STREP     Status: None   Collection Time    11/11/13 11:51 AM      Result Value Ref Range Status   Specimen Description THROAT   Final   Special Requests NONE   Final   Culture     Final   Value: No Beta Hemolytic Streptococci Isolated     Performed at [facility]   Report Status 11/13/2013 FINAL   Final  MRSA PCR SCREENING     Status: None   Collection Time    11/11/13  7:02 PM      Result Value Ref Range Status   MRSA by PCR NEGATIVE  NEGATIVE Final   Comment:            The GeneXpert MRSA Assay (FDA     approved for NASAL specimens     only), is one component of a     comprehensive MRSA colonization     surveillance program. It is not     intended to diagnose MRSA     infection nor to guide or     monitor treatment for     MRSA infections.  CULTURE, EXPECTORATED SPUTUM-ASSESSMENT     Status: None   Collection Time    11/13/13  4:03 PM      Result Value Ref Range Status   Specimen Description SPUTUM   Final   Special Requests Immunocompromised   Final   Sputum evaluation     Final   Value: THIS SPECIMEN IS ACCEPTABLE. RESPIRATORY CULTURE REPORT TO FOLLOW.   Report Status 11/13/2013 FINAL   Final  CULTURE, RESPIRATORY (NON-EXPECTORATED)     Status: None   Collection Time    11/13/13  4:03 PM      Result Value Ref Range Status   Specimen Description SPUTUM   Final   Special Requests NONE   Final   Gram Stain     Final   Value: NO WBC SEEN     NO SQUAMOUS EPITHELIAL CELLS SEEN     NO ORGANISMS SEEN     Performed at [facility]   Culture     Final   Value: RARE FUNGUS (MOLD) ISOLATED, PROBABLE CONTAMINANT/COLONIZER (SAPROPHYTE). CONTACT MICROBIOLOGY IF FURTHER IDENTIFICATION REQUIRED [phone].     Performed at [facility]   Report Status 11/16/2013 FINAL   Final  CULTURE, BLOOD (ROUTINE X 2)     Status: None   Collection Time    11/14/13  2:50 AM      Result Value Ref Range Status    Specimen Description BLOOD RIGHT ARM   Final   Special Requests BOTTLES DRAWN AEROBIC AND ANAEROBIC 5CC   Final   Culture  Setup Time     Final   Value: 11/14/2013 09:35     Performed at [facility]   Culture     Final   Value:        BLOOD CULTURE RECEIVED NO GROWTH TO DATE CULTURE WILL BE HELD FOR 5 DAYS BEFORE ISSUING A FINAL NEGATIVE REPORT     Performed at [facility]   Report Status PENDING   Incomplete  CULTURE, BLOOD (ROUTINE X 2)     Status: None   Collection  Time    11/14/13  2:55 AM      Result Value Ref Range Status   Specimen Description BLOOD RIGHT HAND   Final   Special Requests BOTTLES DRAWN AEROBIC ONLY 3CC   Final   Culture  Setup Time     Final   Value: 11/14/2013 09:35     Performed at [facility]   Culture     Final   Value:        BLOOD CULTURE RECEIVED NO GROWTH TO DATE CULTURE WILL BE HELD FOR 5 DAYS BEFORE ISSUING A FINAL NEGATIVE REPORT     Performed at [facility]   Report Status PENDING   Incomplete     Studies: Dg Chest 2 View  11/16/2013   CLINICAL DATA:  Short of breath.  Pulmonary edema.  EXAM: CHEST  2 VIEW  COMPARISON:  11/14/2013.  FINDINGS: Compared to the prior study, of the pulmonary aeration has improved. There is persistent basilar opacity, greater on the left than right, some of which probably represents residual Edema and some atelectasis. Superimposed pneumonia at cannot be excluded but is not favored.  The cardiopericardial silhouette is within normal limits. Postoperative changes CABG. Monitoring leads project over the chest. Small bilateral pleural effusions remain present with blunting of both costophrenic angles.  IMPRESSION: Improving CHF with mild persistent airspace disease and atelectasis small residual bilateral pleural effusions.   Electronically Signed   By: Andreas Newport M.D.   On: 11/16/2013 10:38    Scheduled Meds: . ALPRAZolam  0.25-0.5 mg Oral QID  . aspirin EC  81 mg Oral QPC supper  .  ferrous sulfate  325 mg Oral QPM  . furosemide  40 mg Oral BID  . gabapentin  600 mg Oral TID  . guaiFENesin  1,200 mg Oral BID  . metoprolol tartrate  50 mg Oral BID  . mometasone-formoterol  2 puff Inhalation BID  . nortriptyline  50 mg Oral QHS  . pantoprazole  40 mg Oral Daily  . piperacillin-tazobactam (ZOSYN)  IV  3.375 g Intravenous 3 times per day  . potassium chloride SA  20 mEq Oral BID  . simvastatin  20 mg Oral QHS  . sodium chloride  3 mL Intravenous Q12H  . warfarin  2.5 mg Oral ONCE-1800  . Warfarin - Pharmacist Dosing Inpatient   Does not apply q1800   Continuous Infusions:   Antibiotics Given (last 72 hours)   Date/Time Action Medication Dose Rate   11/13/13 2044 Given   piperacillin-tazobactam (ZOSYN) IVPB 3.375 g 3.375 g 12.5 mL/hr   11/14/13 0504 Given   piperacillin-tazobactam (ZOSYN) IVPB 3.375 g 3.375 g 12.5 mL/hr   11/14/13 1304 Given   piperacillin-tazobactam (ZOSYN) IVPB 3.375 g 3.375 g 12.5 mL/hr   11/14/13 2106 Given   piperacillin-tazobactam (ZOSYN) IVPB 3.375 g 3.375 g 12.5 mL/hr   11/15/13 0524 Given   piperacillin-tazobactam (ZOSYN) IVPB 3.375 g 3.375 g 12.5 mL/hr   11/15/13 1343 Given   piperacillin-tazobactam (ZOSYN) IVPB 3.375 g 3.375 g 12.5 mL/hr   11/15/13 2052 Given   piperacillin-tazobactam (ZOSYN) IVPB 3.375 g 3.375 g 12.5 mL/hr   11/16/13 0452 Given   piperacillin-tazobactam (ZOSYN) IVPB 3.375 g 3.375 g 12.5 mL/hr   11/16/13 1439 Given   piperacillin-tazobactam (ZOSYN) IVPB 3.375 g 3.375 g 12.5 mL/hr      Principal Problem:   SIRS (systemic inflammatory response syndrome) Active Problems:   COPD (chronic obstructive pulmonary disease)   CKD (chronic kidney disease), stage  III   Atrial fibrillation with RVR   MDS (myelodysplastic syndrome) with 5q deletion   Plasma cell dyscrasia   Encephalopathy acute   Acute encephalopathy   Fever, unspecified   Sepsis    Time spent: 16min    [name]  [name] Pager  [phone]. If 7PM-7AM, please contact night-coverage at [email or web address], password [facility] 11/16/2013, 3:14 PM  LOS: 5 days

## 2013-11-16 NOTE — Progress Notes (Signed)
PT Cancellation Note  Patient Details Name: [name] MRN: [id number] DOB: Nov 26, 1940   Cancelled Treatment:    Reason Eval/Treat Not Completed: Medical issues which prohibited therapy--low hgb. Will hold PT today and check back tomorrow.    Weston Anna, MPT Pager: [phone]

## 2013-11-17 ENCOUNTER — Telehealth: Payer: Self-pay | Admitting: Medical Oncology

## 2013-11-17 ENCOUNTER — Inpatient Hospital Stay: Payer: Medicare Other | Admitting: Adult Health

## 2013-11-17 LAB — CULTURE, BLOOD (ROUTINE X 2)
CULTURE: NO GROWTH
Culture: NO GROWTH

## 2013-11-17 LAB — TYPE AND SCREEN
ABO/RH(D): O NEG
ANTIBODY SCREEN: NEGATIVE
DONOR AG TYPE: NEGATIVE
Donor AG Type: NEGATIVE
UNIT DIVISION: 0
UNIT DIVISION: 0

## 2013-11-17 LAB — HEMOGLOBIN AND HEMATOCRIT, BLOOD
HCT: 28.6 % — ABNORMAL LOW (ref 36.0–46.0)
Hemoglobin: 9 g/dL — ABNORMAL LOW (ref 12.0–15.0)

## 2013-11-17 LAB — PROTIME-INR
INR: 2.85 — ABNORMAL HIGH (ref 0.00–1.49)
Prothrombin Time: 28.9 seconds — ABNORMAL HIGH (ref 11.6–15.2)

## 2013-11-17 MED ORDER — WARFARIN SODIUM 1 MG PO TABS
1.0000 mg | ORAL_TABLET | Freq: Once | ORAL | Status: DC
  Filled 2013-11-17: qty 1

## 2013-11-17 MED ORDER — AMOXICILLIN-POT CLAVULANATE 875-125 MG PO TABS: 1.0000 | ORAL_TABLET | Freq: Two times a day (BID) | ORAL | Status: DC

## 2013-11-17 NOTE — Progress Notes (Addendum)
ANTIBIOTIC CONSULT NOTE - FOLLOW UP   Pharmacy Consult for Zosyn Indication: HCAP  No Known Allergies  Patient Measurements: Height: 5' 7.5" (171.5 cm) Weight: 203 lb 4.2 oz (92.2 kg) IBW/kg (Calculated) : 62.75 :   Vital Signs: Temp: 98.7 F (37.1 C) (05/01 0517) Temp src: Oral (05/01 0517) BP: 118/73 mmHg (05/01 0517) Pulse Rate: 120 (05/01 0517) Intake/Output from previous day: 04/30 0701 - 05/01 0700 In: 1237 [P.O.:840; Blood:347; IV Piggyback:50] Out: 800 [Urine:800] Intake/Output from this shift:    Labs:  Recent Labs  11/15/13 0510 11/16/13 0446  WBC 4.5  --   HGB 7.9* 7.7*  PLT 42*  --   CREATININE 1.13*  --    Estimated Creatinine Clearance: 53 ml/min (by C-G formula based on Cr of 1.13). No results found for this basename: VANCOTROUGH, VANCOPEAK, VANCORANDOM, [place], GENTPEAK, GENTRANDOM, [place], TOBRAPEAK, TOBRARND, AMIKACINPEAK, AMIKACINTROU, AMIKACIN,  in the last 72 hours   Microbiology: Recent Results (from the past 720 hour(s))  CULTURE, BLOOD (ROUTINE X 2)     Status: None   Collection Time    10/22/13  8:44 AM      Result Value Ref Range Status   Specimen Description BLOOD RIGHT ANTECUBITAL   Final   Special Requests BOTTLES DRAWN AEROBIC AND ANAEROBIC 5 CC EACH   Final   Culture  Setup Time     Final   Value: 10/22/2013 15:20     Performed at [facility]   Culture     Final   Value: NO GROWTH 5 DAYS     Performed at [facility]   Report Status 10/28/2013 FINAL   Final  CULTURE, BLOOD (ROUTINE X 2)     Status: None   Collection Time    10/22/13  8:44 AM      Result Value Ref Range Status   Specimen Description BLOOD BLOOD LEFT FOREARM   Final   Special Requests BOTTLES DRAWN AEROBIC AND ANAEROBIC 4 CC [facility]   Final   Culture  Setup Time     Final   Value: 10/22/2013 15:20     Performed at [facility]   Culture     Final   Value: NO GROWTH 5 DAYS     Performed at [facility]   Report Status  10/28/2013 FINAL   Final  URINE CULTURE     Status: None   Collection Time    10/22/13 11:41 AM      Result Value Ref Range Status   Specimen Description URINE, CLEAN CATCH   Final   Special Requests NONE   Final   Culture  Setup Time     Final   Value: 10/22/2013 19:23     Performed at [place]     Final   Value: NO GROWTH     Performed at [facility]   Culture     Final   Value: NO GROWTH     Performed at [facility]   Report Status 10/23/2013 FINAL   Final  MRSA PCR SCREENING     Status: Abnormal   Collection Time    10/22/13 11:43 AM      Result Value Ref Range Status   MRSA by PCR POSITIVE (*) NEGATIVE Final   Comment:            The GeneXpert MRSA Assay (FDA     approved for NASAL specimens     only),  is one component of a     comprehensive MRSA colonization     surveillance program. It is not     intended to diagnose MRSA     infection nor to guide or     monitor treatment for     MRSA infections.     RESULT CALLED TO, READ BACK BY AND VERIFIED WITH:     D.VAUDREUIL AT 1504 ON [id number] BY C.BONGEL  CULTURE, EXPECTORATED SPUTUM-ASSESSMENT     Status: None   Collection Time    10/23/13 11:10 AM      Result Value Ref Range Status   Specimen Description SPUTUM   Final   Special Requests NONE   Final   Sputum evaluation     Final   Value: THIS SPECIMEN IS ACCEPTABLE. RESPIRATORY CULTURE REPORT TO FOLLOW.   Report Status 10/23/2013 FINAL   Final  CULTURE, RESPIRATORY (NON-EXPECTORATED)     Status: None   Collection Time    10/23/13 11:10 AM      Result Value Ref Range Status   Specimen Description SPUTUM   Final   Special Requests NONE   Final   Gram Stain     Final   Value: NO WBC SEEN     NO SQUAMOUS EPITHELIAL CELLS SEEN     NO ORGANISMS SEEN     Performed at [facility]   Culture     Final   Value: NORMAL OROPHARYNGEAL FLORA     Performed at [facility]   Report Status 10/25/2013 FINAL   Final   TECHNOLOGIST REVIEW     Status: None   Collection Time    11/02/13 10:33 AM      Result Value Ref Range Status   Technologist Review 2% blast, rare nrbc, Metas and Myelocytes present   Final  CULTURE, BLOOD (ROUTINE X 2)     Status: None   Collection Time    11/11/13  6:26 AM      Result Value Ref Range Status   Specimen Description BLOOD RIGHT WRIST   Final   Special Requests BOTTLES DRAWN AEROBIC AND ANAEROBIC 5CC EACH   Final   Culture  Setup Time     Final   Value: 11/11/2013 11:20     Performed at [facility]   Culture     Final   Value:        BLOOD CULTURE RECEIVED NO GROWTH TO DATE CULTURE WILL BE HELD FOR 5 DAYS BEFORE ISSUING A FINAL NEGATIVE REPORT     Performed at [facility]   Report Status PENDING   Incomplete  CULTURE, BLOOD (ROUTINE X 2)     Status: None   Collection Time    11/11/13  6:35 AM      Result Value Ref Range Status   Specimen Description BLOOD LEFT HAND   Final   Special Requests BOTTLES DRAWN AEROBIC AND ANAEROBIC 5CC EACH   Final   Culture  Setup Time     Final   Value: 11/11/2013 11:20     Performed at [facility]   Culture     Final   Value:        BLOOD CULTURE RECEIVED NO GROWTH TO DATE CULTURE WILL BE HELD FOR 5 DAYS BEFORE ISSUING A FINAL NEGATIVE REPORT     Performed at [facility]   Report Status PENDING   Incomplete  URINE CULTURE     Status: None   Collection  Time    11/11/13  7:29 AM      Result Value Ref Range Status   Specimen Description URINE, CLEAN CATCH   Final   Special Requests NONE   Final   Culture  Setup Time     Final   Value: 11/11/2013 12:18     Performed at [facility]     Final   Value: 4,000 COLONIES/ML     Performed at [facility]   Culture     Final   Value: INSIGNIFICANT GROWTH     Performed at [facility]   Report Status 11/12/2013 FINAL   Final  RAPID STREP SCREEN     Status: None   Collection Time    11/11/13 11:51 AM       Result Value Ref Range Status   Streptococcus, Group A Screen (Direct) NEGATIVE  NEGATIVE Final   Comment: (NOTE)     A Rapid Antigen test may result negative if the antigen level in the     sample is below the detection level of this test. The FDA has not     cleared this test as a stand-alone test therefore the rapid antigen     negative result has reflexed to a Group A Strep culture.  CULTURE, GROUP A STREP     Status: None   Collection Time    11/11/13 11:51 AM      Result Value Ref Range Status   Specimen Description THROAT   Final   Special Requests NONE   Final   Culture     Final   Value: No Beta Hemolytic Streptococci Isolated     Performed at [facility]   Report Status 11/13/2013 FINAL   Final  MRSA PCR SCREENING     Status: None   Collection Time    11/11/13  7:02 PM      Result Value Ref Range Status   MRSA by PCR NEGATIVE  NEGATIVE Final   Comment:            The GeneXpert MRSA Assay (FDA     approved for NASAL specimens     only), is one component of a     comprehensive MRSA colonization     surveillance program. It is not     intended to diagnose MRSA     infection nor to guide or     monitor treatment for     MRSA infections.  CULTURE, EXPECTORATED SPUTUM-ASSESSMENT     Status: None   Collection Time    11/13/13  4:03 PM      Result Value Ref Range Status   Specimen Description SPUTUM   Final   Special Requests Immunocompromised   Final   Sputum evaluation     Final   Value: THIS SPECIMEN IS ACCEPTABLE. RESPIRATORY CULTURE REPORT TO FOLLOW.   Report Status 11/13/2013 FINAL   Final  CULTURE, RESPIRATORY (NON-EXPECTORATED)     Status: None   Collection Time    11/13/13  4:03 PM      Result Value Ref Range Status   Specimen Description SPUTUM   Final   Special Requests NONE   Final   Gram Stain     Final   Value: NO WBC SEEN     NO SQUAMOUS EPITHELIAL CELLS SEEN     NO ORGANISMS SEEN     Performed at [facility]   Culture     Final  Value: RARE FUNGUS (MOLD) ISOLATED, PROBABLE CONTAMINANT/COLONIZER (SAPROPHYTE). CONTACT MICROBIOLOGY IF FURTHER IDENTIFICATION REQUIRED [phone].     Performed at [facility]   Report Status 11/16/2013 FINAL   Final  CULTURE, BLOOD (ROUTINE X 2)     Status: None   Collection Time    11/14/13  2:50 AM      Result Value Ref Range Status   Specimen Description BLOOD RIGHT ARM   Final   Special Requests BOTTLES DRAWN AEROBIC AND ANAEROBIC 5CC   Final   Culture  Setup Time     Final   Value: 11/14/2013 09:35     Performed at [facility]   Culture     Final   Value:        BLOOD CULTURE RECEIVED NO GROWTH TO DATE CULTURE WILL BE HELD FOR 5 DAYS BEFORE ISSUING A FINAL NEGATIVE REPORT     Performed at [facility]   Report Status PENDING   Incomplete  CULTURE, BLOOD (ROUTINE X 2)     Status: None   Collection Time    11/14/13  2:55 AM      Result Value Ref Range Status   Specimen Description BLOOD RIGHT HAND   Final   Special Requests BOTTLES DRAWN AEROBIC ONLY 3CC   Final   Culture  Setup Time     Final   Value: 11/14/2013 09:35     Performed at [facility]   Culture     Final   Value:        BLOOD CULTURE RECEIVED NO GROWTH TO DATE CULTURE WILL BE HELD FOR 5 DAYS BEFORE ISSUING A FINAL NEGATIVE REPORT     Performed at [facility]   Report Status PENDING   Incomplete    Anti-infectives   Start     Dose/Rate Route Frequency Ordered Stop   11/12/13 0600  ceFEPIme (MAXIPIME) 2 g in dextrose 5 % 50 mL IVPB  Status:  Discontinued     2 g 100 mL/hr over 30 Minutes Intravenous Every 24 hours 11/11/13 0702 11/11/13 1126   11/11/13 2000  vancomycin (VANCOCIN) IVPB 750 mg/150 ml premix  Status:  Discontinued     750 mg 150 mL/hr over 60 Minutes Intravenous Every 12 hours 11/11/13 0705 11/11/13 1126   11/11/13 1400  vancomycin (VANCOCIN) 1,250 mg in sodium chloride 0.9 % 250 mL IVPB  Status:  Discontinued     1,250 mg 166.7 mL/hr over 90  Minutes Intravenous Every 24 hours 11/11/13 1126 11/13/13 1224   11/11/13 1400  piperacillin-tazobactam (ZOSYN) IVPB 3.375 g     3.375 g 12.5 mL/hr over 240 Minutes Intravenous 3 times per day 11/11/13 1126     11/11/13 0615  ceFEPIme (MAXIPIME) 2 g in dextrose 5 % 50 mL IVPB     2 g 100 mL/hr over 30 Minutes Intravenous  Once 11/11/13 0610 11/11/13 0703   11/11/13 0615  vancomycin (VANCOCIN) IVPB 1000 mg/200 mL premix     1,000 mg 200 mL/hr over 60 Minutes Intravenous  Once 11/11/13 0610 11/11/13 [id number]     Anti-infectives: 4/25 >>cefepime >> 4/25 4/25 >> zosyn >> 4/25 >>vancomycin >> 4/27  Assessment: 75 YOF recently discharged from [facility], presents with confusion and fever.  Vancomycin and cefepime initially ordered, changed to vancomycin and zosyn,  Vancomycin discontinued on 4/27( 3 days of Vanc)   Day #7 Zosyn  Afebrile  WBC wnl (4.5 on 4/29)  SCr 1.13 (4/29), CrCl 53 ml/min  Goal of Therapy:  Eradication of infection Dose zosyn per renal function  Plan:   Continue Zosyn 3.375gm IV q8h but likely transition to PO today    Peggyann Juba, PharmD, BCPS Pager: [phone] 11/17/2013 8:00 AM

## 2013-11-17 NOTE — Progress Notes (Addendum)
ANTICOAGULATION CONSULT NOTE - Follow Up   Pharmacy Consult for warfarin Indication: atrial fibrillation  No Known Allergies  Patient Measurements: Height: 5' 7.5" (171.5 cm) Weight: 203 lb 4.2 oz (92.2 kg) IBW/kg (Calculated) : 62.75   Vital Signs: Temp: 98.7 F (37.1 C) (05/01 0517) Temp src: Oral (05/01 0517) BP: 118/73 mmHg (05/01 0517) Pulse Rate: 120 (05/01 0517)  Labs:  Recent Labs  11/15/13 0510 11/16/13 0446 11/17/13 0448  HGB 7.9* 7.7*  --   HCT 25.4* 24.1*  --   PLT 42*  --   --   LABPROT 24.9* 26.6* 28.9*  INR 2.34* 2.55* 2.85*  CREATININE 1.13*  --   --    Assessment: 38 yof known to pharmacy from previous admissions, on warfarin for h/o afib  Per anti-coag visit from 4/24: INR was 5.1 with plans to hold warfarin x 3 days then start warfarin 2.5mg  daily with 5mg  on SunWed Warfarin resumed 4/28, has received 3 doses of 2.5mg  since then INR: remains therapeutic 2.85 but trending up daily CBC: low (pt. has MDS) Concurrent aspirin 81mg  daily noted Diet: tolerating ~50% cardiac diet  Goal of Therapy:  INR 2-3   Plan:   Decrease Warfarin to 1mg  today   Daily PT/INR  If discharged today, suggest just holding dose today, then continue with 2.5mg  daily and recheck INR early next week.  Peggyann Juba, PharmD, BCPS Pager: [phone]  11/17/2013, 7:54 AM

## 2013-11-17 NOTE — Progress Notes (Signed)
Physical Therapy Treatment Patient Details Name: [name] MRN: [id number] DOB: 1940/07/28 Today's Date: 11/17/2013    History of Present Illness pt admitted with sepsis, AMS, HCAP.    PT Comments    Continues to be confused at times and demonstrates unsafe behaviors when mobilizing.   Follow Up Recommendations  Home health PT;Supervision/Assistance - 24 hour (may need to consider SNF if pt remains confused and unsafe)     Equipment Recommendations  None recommended by PT    Recommendations for Other Services       Precautions / Restrictions Precautions Precautions: Fall Restrictions Weight Bearing Restrictions: No    Mobility  Bed Mobility Overal bed mobility: Needs Assistance Bed Mobility: Supine to Sit     Supine to sit: Min guard;HOB elevated     General bed mobility comments: close guard for safety. Increased time.   Transfers Overall transfer level: Needs assistance Equipment used: Rolling walker (2 wheeled) Transfers: Sit to/from Stand Sit to Stand: Min assist;+2 safety/equipment;+2 physical assistance         General transfer comment: Pt is unsteady. +2 for safety/assist due to fall risk.   Ambulation/Gait Ambulation/Gait assistance: Min assist;+2 physical assistance;+2 safety/equipment Ambulation Distance (Feet): 75 Feet Assistive device: Rolling walker (2 wheeled) Gait Pattern/deviations: Step-through pattern;Decreased stride length     General Gait Details: slow gait speed. assist to stabilize throughout ambulation. pt is very unsteady. c/o dizziness midway through ambulation distance. O2 sats 88% on RA.    Stairs            Wheelchair Mobility    Modified Rankin (Stroke Patients Only)       Balance                                    Cognition Arousal/Alertness: Awake/alert Behavior During Therapy: WFL for tasks assessed/performed Overall Cognitive Status: Impaired/Different from baseline Area of  Impairment: Safety/judgement         Safety/Judgement: Decreased awareness of safety;Decreased awareness of deficits          Exercises      General Comments        Pertinent Vitals/Pain No c/o pain    Home Living                      Prior Function            PT Goals (current goals can now be found in the care plan section) Progress towards PT goals: Progressing toward goals    Frequency  Min 3X/week    PT Plan Current plan remains appropriate    Co-evaluation             End of Session   Activity Tolerance: Patient limited by fatigue Patient left: in chair;with call bell/phone within reach;with chair alarm set;with family/visitor present     Time: 1120-1159 PT Time Calculation (min): 39 min  Charges:  $Gait Training: 8-22 mins $Therapeutic Activity: 23-37 mins                    G Codes:      Weston Anna, MPT Pager: [phone]

## 2013-11-17 NOTE — Discharge Summary (Signed)
Physician Discharge Summary  [name] HCW:[id number] DOB: 05-28-1941 DOA: 11/11/2013  PCP: Noralee Space, MD  Admit date: 11/11/2013 Discharge date: 11/17/2013  Time spent: 30 minutes  Recommendations for Outpatient Follow-up:  Follow up with PCP in one week Follow  Up with pulmonary as recommended.   Discharge Diagnoses:  Principal Problem:   SIRS (systemic inflammatory response syndrome) Active Problems:   COPD (chronic obstructive pulmonary disease)   CKD (chronic kidney disease), stage III   Atrial fibrillation with RVR   MDS (myelodysplastic syndrome) with 5q deletion   Plasma cell dyscrasia   Encephalopathy acute   Acute encephalopathy   Fever, unspecified   Sepsis   Discharge Condition: improved.   Diet recommendation: low sodium diet.   Filed Weights   11/15/13 0506 11/16/13 0440 11/17/13 0517  Weight: 94.2 kg (207 lb 10.8 oz) 93.3 kg (205 lb 11 oz) 92.2 kg (203 lb 4.2 oz)    History of present illness:  [name] is a 73 y.o. female with PMH of COPD, MDS, and plasma cell dyscrasia, Afib on coumadin was brought in by her husband for fever and confusion.  She was discharged from [facility] 2 weeks ago after treatment for COPD exacerbation/Pneumonia.  In ER, she is found to be febrile at 102. 5.She is on Revlimid for MDS which was started 2 days ago.  Repeat CXR was concerning for pneumonia and by Next day of admission was coughing up copious amount of thick greenish sputum. She was admitted for management of HCAP.     Hospital Course:  1. Sepsis/HCAP : patient admitted to telemetry and completed 5 days of IV zosyn and 3 days of IV vancomycin.  - blood cultures negative so far . She was discharged on an oral antibiotic to complete the course.   2. Metabolic encephalopathy  Secondary to health care associated pneumonia. Resolved with hydration and antibiotics.  3. COPD (chronic obstructive pulmonary disease)  - no exacerbation currently. Resume  bronchodilators as needed.  4. MDS (myelodysplastic syndrome) with 5q deletion  -and concomitant Plasma cell dyscrasia .  D/w Dr.Mohamed recommends holding Revlimid for now   5. A-fib with RVR  - rate improved with hydration .  6. Elevated INR on admission -improved , resume coumadin on discharge.   7. Chronic diastolic heart failure: resume lasix as per home dose.        Procedures:  none  Consultations:   none Discharge Exam: Filed Vitals:   11/17/13 0949  BP: 116/64  Pulse: 84  Temp: 97.4 F (36.3 C)  Resp: 20   General: alert afebrile comfortable Cardiovascular: s1s2 Respiratory: ctab  Discharge Instructions You were cared for by a hospitalist during your hospital stay. If you have any questions about your discharge medications or the care you received while you were in the hospital after you are discharged, you can call the unit and asked to speak with the hospitalist on call if the hospitalist that took care of you is not available. Once you are discharged, your primary care physician will handle any further medical issues. Please note that NO REFILLS for any discharge medications will be authorized once you are discharged, as it is imperative that you return to your primary care physician (or establish a relationship with a primary care physician if you do not have one) for your aftercare needs so that they can reassess your need for medications and monitor your lab values.  Discharge Orders   Future Appointments Provider Department Dept Phone  01/15/2014 10:30 AM Noralee Space, MD [facility] [phone]   Future Orders Complete By Expires   Diet - low sodium heart healthy  As directed    Discharge instructions  As directed        Medication List    STOP taking these medications       guaiFENesin 600 MG 12 hr tablet  Commonly known as:  [place] these medications       albuterol (2.5 MG/3ML) 0.083% nebulizer solution  Commonly  known as:  PROVENTIL  Take 3 mLs (2.5 mg total) by nebulization 4 (four) times daily as needed for wheezing or shortness of breath.     ALPRAZolam 0.5 MG tablet  Commonly known as:  XANAX  Take 0.25-0.5 mg by mouth 4 (four) times daily as needed for anxiety.     amoxicillin-clavulanate 875-125 MG per tablet  Commonly known as:  AUGMENTIN  Take 1 tablet by mouth 2 (two) times daily.     aspirin EC 81 MG tablet  Take 81 mg by mouth daily after supper.     benzonatate 100 MG capsule  Commonly known as:  TESSALON  Take 1 capsule (100 mg total) by mouth 3 (three) times daily as needed for cough.     calcium-vitamin D 500-200 MG-UNIT per tablet  Take 1 tablet by mouth 2 (two) times daily with a meal.     docusate sodium 100 MG capsule  Commonly known as:  COLACE  Take 200 mg by mouth daily as needed for mild constipation.     ferrous sulfate 325 (65 FE) MG tablet  Take 325 mg by mouth every evening.     Fluticasone-Salmeterol 250-50 MCG/DOSE Aepb  Commonly known as:  ADVAIR DISKUS  Inhale 1 puff into the lungs 2 (two) times daily.     furosemide 40 MG tablet  Commonly known as:  LASIX  Take 40 mg by mouth 2 (two) times daily.     gabapentin 600 MG tablet  Commonly known as:  NEURONTIN  Take 600 mg by mouth 3 (three) times daily.     isosorbide mononitrate 30 MG 24 hr tablet  Commonly known as:  IMDUR  Take 1 tablet (30 mg total) by mouth at bedtime.     lenalidomide 10 MG capsule  Commonly known as:  REVLIMID  Take 1 capsule (10 mg total) by mouth daily. Take 1 capsule ( 10 mg) by mouth daily for 21 days every 4 weeks     metoprolol 50 MG tablet  Commonly known as:  LOPRESSOR  Take 1 tablet (50 mg total) by mouth 2 (two) times daily.     multivitamin with minerals Tabs tablet  Take 1 tablet by mouth every morning.     nitroGLYCERIN 0.4 MG SL tablet  Commonly known as:  NITROSTAT  Place 0.4 mg under the tongue every 5 (five) minutes as needed for chest pain.      nortriptyline 50 MG capsule  Commonly known as:  PAMELOR  Take 1 capsule (50 mg total) by mouth at bedtime.     omeprazole 20 MG capsule  Commonly known as:  PRILOSEC  Take 20 mg by mouth 2 (two) times daily.     ondansetron 4 MG tablet  Commonly known as:  ZOFRAN  Take 4 mg by mouth every 8 (eight) hours as needed for nausea or vomiting.     oxyCODONE-acetaminophen 5-325 MG per tablet  Commonly known as:  PERCOCET/ROXICET  Take 2 tablets by mouth every 4 (four) hours as needed for pain.     potassium chloride SA 20 MEQ tablet  Commonly known as:  K-DUR,KLOR-CON  Take 20 mEq by mouth 2 (two) times daily.     simvastatin 20 MG tablet  Commonly known as:  ZOCOR  Take 20 mg by mouth at bedtime.     tiZANidine 4 MG tablet  Commonly known as:  ZANAFLEX  Take 4 mg by mouth 3 (three) times daily.     warfarin 5 MG tablet  Commonly known as:  COUMADIN  Take 2.5-5 mg by mouth every morning. She takes half a tablet on Monday, Wednesday, Friday and one tablet on Tuesday, Thursday, Saturday and Sunday.       No Known Allergies    The results of significant diagnostics from this hospitalization (including imaging, microbiology, ancillary and laboratory) are listed below for reference.    Significant Diagnostic Studies: Dg Chest 2 View  11/16/2013   CLINICAL DATA:  Short of breath.  Pulmonary edema.  EXAM: CHEST  2 VIEW  COMPARISON:  11/14/2013.  FINDINGS: Compared to the prior study, of the pulmonary aeration has improved. There is persistent basilar opacity, greater on the left than right, some of which probably represents residual Edema and some atelectasis. Superimposed pneumonia at cannot be excluded but is not favored.  The cardiopericardial silhouette is within normal limits. Postoperative changes CABG. Monitoring leads project over the chest. Small bilateral pleural effusions remain present with blunting of both costophrenic angles.  IMPRESSION: Improving CHF with mild persistent  airspace disease and atelectasis small residual bilateral pleural effusions.   Electronically Signed   By: Dereck Ligas M.D.   On: 11/16/2013 10:38   Dg Chest 2 View  11/12/2013   CLINICAL DATA:  Cough and fever  EXAM: CHEST  2 VIEW  COMPARISON:  DG CHEST 1V PORT dated 11/11/2013  FINDINGS: Sternotomy wires overlie normal cardiac silhouette. There is a left retrocardiac opacity which is subtle. Right lung base is clear. Upper lobes are clear. There is a chronic bronchitic markings.  IMPRESSION: Left lower lobe density representing atelectasis versus infiltrate.   Electronically Signed   By: Suzy Bouchard M.D.   On: 11/12/2013 11:44   Dg Chest 2 View  11/06/2013   CLINICAL DATA:  Shortness of breath.  Cough.  EXAM: CHEST  2 VIEW  COMPARISON:  DG CHEST 1V PORT dated 10/22/2013  FINDINGS: Interim near complete resolution of changes of congestive heart failure and edema noted. Stable cardiomegaly. Prior CABG. No pleural effusion or pneumothorax. Degenerative changes thoracic spine. Mild atelectasis lung bases.  IMPRESSION: Interim near complete resolution of pulmonary edema .   Electronically Signed   By: Marcello Moores  Register   On: 11/06/2013 13:55   Ct Head Wo Contrast  11/11/2013   CLINICAL DATA:  Altered mental status, headache, on blood thinner  EXAM: CT HEAD WITHOUT CONTRAST  TECHNIQUE: Contiguous axial images were obtained from the base of the skull through the vertex without intravenous contrast.  COMPARISON:  Prior CT head 10/18/2012  FINDINGS: Negative for acute intracranial hemorrhage, acute infarction, mass, mass effect, hydrocephalus or midline shift. Gray-white differentiation is preserved throughout. Global cerebral and cerebellar volume loss. Stable periventricular and subcortical white matter hypoattenuation most consistent with sequelae of longstanding microvascular ischemia. Symmetric and unremarkable appearance of the globes and orbits. Normal aeration of the mastoid air cells and visualized  paranasal sinuses. Atherosclerotic calcifications in the bilateral cavernous and supra clinoid  internal carotid arteries.  IMPRESSION: 1. No acute intracranial process. 2. Stable cerebral atrophy and chronic microvascular ischemic white matter disease. 3. Intracranial atherosclerosis.   Electronically Signed   By: Jacqulynn Cadet M.D.   On: 11/11/2013 07:23   Dg Chest Port 1 View  11/14/2013   CLINICAL DATA:  Fever and confusion.  Concern for pneumonia.  EXAM: PORTABLE CHEST - 1 VIEW  COMPARISON:  Chest radiograph performed 11/12/2013  FINDINGS: The lungs are well expanded. Vascular congestion is noted. Patchy bilateral airspace opacities are seen, worse on the left. This may reflect pneumonia or pulmonary edema. No definite pleural effusion or pneumothorax is seen. The left costophrenic angle is incompletely imaged on this study.  The cardiomediastinal silhouette remains normal in size. The patient is status post median sternotomy. There is a chronic fracture of the second superiormost sternal wire. No acute osseous abnormalities are seen.  IMPRESSION: Vascular congestion noted. Patchy bilateral airspace opacities, worse on the left. This may reflect pneumonia or pulmonary edema.   Electronically Signed   By: Garald Balding M.D.   On: 11/14/2013 03:31   Dg Chest Port 1 View  11/11/2013   CLINICAL DATA:  Fever, cough, atrial fibrillation  EXAM: PORTABLE CHEST - 1 VIEW  COMPARISON:  DG CHEST 1V PORT dated 11/08/2013; DG CHEST 2 VIEW dated 11/06/2013; DG CHEST 1V PORT dated 10/22/2013  FINDINGS: Mild cardiac enlargement status post CABG. Vascular pattern is normal. There is no consolidation, effusion, or pneumothorax.  IMPRESSION: No active disease.   Electronically Signed   By: Skipper Cliche M.D.   On: 11/11/2013 07:03   Dg Chest Portable 1 View  11/08/2013   CLINICAL DATA:  Fever.  Cough.  History of heart disease.  EXAM: PORTABLE CHEST - 1 VIEW  COMPARISON:  DG CHEST 2 VIEW dated 11/06/2013; CT CHEST W/CM  dated 08/31/2013; DG CHEST 2 VIEW dated 07/25/2013  FINDINGS: Prior CABG. Mild cardiomegaly. Indistinct pulmonary vasculature. Emphysema. No overt edema.  IMPRESSION: 1. Cardiomegaly with mild indistinctness of the pulmonary vasculature which could reflect pulmonary venous hypertension. No overt edema. 2. Emphysema.   Electronically Signed   By: Sherryl Barters M.D.   On: 11/08/2013 07:26   Dg Chest Port 1 View  (if Code Sepsis Called)  10/22/2013   CLINICAL DATA:  Shortness of breath, fever, altered mental status  EXAM: PORTABLE CHEST - 1 VIEW  COMPARISON:  09/06/2013, 08/31/2013  FINDINGS: Mild background emphysema noted. Prior coronary bypass changes. Heart is enlarged with central vascular congestion. No effusion or pneumothorax. Increased patchy airspace opacity in the left hilar region and also the left lower lobe retrocardiac area. This could represent pneumonia. Trachea is midline. Atherosclerosis of the aorta. Artifact overlies the left apex.  IMPRESSION: Cardiomegaly with vascular congestion  Patchy left perihilar and retrocardiac left lower lobe airspace process concerning for pneumonia.   Electronically Signed   By: Daryll Brod M.D.   On: 10/22/2013 08:47    Microbiology: Recent Results (from the past 240 hour(s))  CULTURE, BLOOD (ROUTINE X 2)     Status: None   Collection Time    11/11/13  6:26 AM      Result Value Ref Range Status   Specimen Description BLOOD RIGHT WRIST   Final   Special Requests BOTTLES DRAWN AEROBIC AND ANAEROBIC [facility] EACH   Final   Culture  Setup Time     Final   Value: 11/11/2013 11:20     Performed at [facility]  Final   Value: NO GROWTH 5 DAYS     Performed at [facility]   Report Status 11/17/2013 FINAL   Final  CULTURE, BLOOD (ROUTINE X 2)     Status: None   Collection Time    11/11/13  6:35 AM      Result Value Ref Range Status   Specimen Description BLOOD LEFT HAND   Final   Special Requests BOTTLES DRAWN AEROBIC AND  ANAEROBIC [facility] EACH   Final   Culture  Setup Time     Final   Value: 11/11/2013 11:20     Performed at [facility]   Culture     Final   Value: NO GROWTH 5 DAYS     Performed at [facility]   Report Status 11/17/2013 FINAL   Final  URINE CULTURE     Status: None   Collection Time    11/11/13  7:29 AM      Result Value Ref Range Status   Specimen Description URINE, CLEAN CATCH   Final   Special Requests NONE   Final   Culture  Setup Time     Final   Value: 11/11/2013 12:18     Performed at [place]     Final   Value: 4,000 COLONIES/ML     Performed at [facility]   Culture     Final   Value: INSIGNIFICANT GROWTH     Performed at [facility]   Report Status 11/12/2013 FINAL   Final  RAPID STREP SCREEN     Status: None   Collection Time    11/11/13 11:51 AM      Result Value Ref Range Status   Streptococcus, Group A Screen (Direct) NEGATIVE  NEGATIVE Final   Comment: (NOTE)     A Rapid Antigen test may result negative if the antigen level in the     sample is below the detection level of this test. The FDA has not     cleared this test as a stand-alone test therefore the rapid antigen     negative result has reflexed to a Group A Strep culture.  CULTURE, GROUP A STREP     Status: None   Collection Time    11/11/13 11:51 AM      Result Value Ref Range Status   Specimen Description THROAT   Final   Special Requests NONE   Final   Culture     Final   Value: No Beta Hemolytic Streptococci Isolated     Performed at [facility]   Report Status 11/13/2013 FINAL   Final  MRSA PCR SCREENING     Status: None   Collection Time    11/11/13  7:02 PM      Result Value Ref Range Status   MRSA by PCR NEGATIVE  NEGATIVE Final   Comment:            The GeneXpert MRSA Assay (FDA     approved for NASAL specimens     only), is one component of a     comprehensive MRSA colonization     surveillance program. It is not      intended to diagnose MRSA     infection nor to guide or     monitor treatment for     MRSA infections.  CULTURE, EXPECTORATED SPUTUM-ASSESSMENT     Status: None   Collection Time    11/13/13  4:03 PM      Result Value Ref Range Status   Specimen Description SPUTUM   Final   Special Requests Immunocompromised   Final   Sputum evaluation     Final   Value: THIS SPECIMEN IS ACCEPTABLE. RESPIRATORY CULTURE REPORT TO FOLLOW.   Report Status 11/13/2013 FINAL   Final  CULTURE, RESPIRATORY (NON-EXPECTORATED)     Status: None   Collection Time    11/13/13  4:03 PM      Result Value Ref Range Status   Specimen Description SPUTUM   Final   Special Requests NONE   Final   Gram Stain     Final   Value: NO WBC SEEN     NO SQUAMOUS EPITHELIAL CELLS SEEN     NO ORGANISMS SEEN     Performed at [facility]   Culture     Final   Value: RARE FUNGUS (MOLD) ISOLATED, PROBABLE CONTAMINANT/COLONIZER (SAPROPHYTE). CONTACT MICROBIOLOGY IF FURTHER IDENTIFICATION REQUIRED [phone].     Performed at [facility]   Report Status 11/16/2013 FINAL   Final  CULTURE, BLOOD (ROUTINE X 2)     Status: None   Collection Time    11/14/13  2:50 AM      Result Value Ref Range Status   Specimen Description BLOOD RIGHT ARM   Final   Special Requests BOTTLES DRAWN AEROBIC AND ANAEROBIC 5CC   Final   Culture  Setup Time     Final   Value: 11/14/2013 09:35     Performed at [facility]   Culture     Final   Value:        BLOOD CULTURE RECEIVED NO GROWTH TO DATE CULTURE WILL BE HELD FOR 5 DAYS BEFORE ISSUING A FINAL NEGATIVE REPORT     Performed at [facility]   Report Status PENDING   Incomplete  CULTURE, BLOOD (ROUTINE X 2)     Status: None   Collection Time    11/14/13  2:55 AM      Result Value Ref Range Status   Specimen Description BLOOD RIGHT HAND   Final   Special Requests BOTTLES DRAWN AEROBIC ONLY 3CC   Final   Culture  Setup Time     Final   Value: 11/14/2013  09:35     Performed at [facility]   Culture     Final   Value:        BLOOD CULTURE RECEIVED NO GROWTH TO DATE CULTURE WILL BE HELD FOR 5 DAYS BEFORE ISSUING A FINAL NEGATIVE REPORT     Performed at [facility]   Report Status PENDING   Incomplete     Labs: Basic Metabolic Panel:  Recent Labs Lab 11/11/13 0614 11/12/13 0823 11/13/13 0314 11/14/13 0235 11/14/13 0420 11/15/13 0510  NA 137 141 141 136*  --  140  K 4.6 3.9 4.2 4.5  --  4.4  CL 100 103 106 101  --  106  CO2 23 25 22 21   --  23  GLUCOSE 102* 90 112* 90  --  94  BUN 17 16 16 12   --  13  CREATININE 1.34* 1.26* 1.21* 1.14*  --  1.13*  CALCIUM 8.7 7.6* 7.7* 7.9*  --  8.5  MG  --   --   --   --  1.6  --    Liver Function Tests:  Recent Labs Lab 11/11/13 0614  AST 37  ALT 16  ALKPHOS 148*  BILITOT 0.9  PROT 7.1  ALBUMIN 2.9*   No results found for this basename: LIPASE, AMYLASE,  in the last 168 hours No results found for this basename: AMMONIA,  in the last 168 hours CBC:  Recent Labs Lab 11/11/13 0614 11/12/13 0823 11/13/13 0314 11/13/13 1746 11/14/13 0235 11/15/13 0510 11/16/13 0446  WBC 5.0 3.7* 3.2*  --  4.5 4.5  --   NEUTROABS 1.0*  --   --   --  0.9*  --   --   HGB 8.4* 7.0* 6.9* 8.6* 8.2* 7.9* 7.7*  HCT 26.2* 21.5* 22.3* 26.8* 26.0* 25.4* 24.1*  MCV 97.0 97.3 98.2  --  96.3 96.6  --   PLT 65* 34* 28*  --  45* 42*  --    Cardiac Enzymes:  Recent Labs Lab 11/11/13 2122 11/12/13 0210 11/12/13 0823  TROPONINI <0.30 <0.30 <0.30   BNP: BNP (last 3 results)  Recent Labs  09/03/13 1500 09/21/13 1125 10/22/13 0844  PROBNP 14053.0* 203.0* 5985.0*   CBG: No results found for this basename: GLUCAP,  in the last 168 hours     Signed:  Hosie Poisson  [name] 11/17/2013, 12:34 PM

## 2013-11-17 NOTE — Progress Notes (Signed)
Patient discharged home with husband, discharge instructions given and explained to patient/husband and they verbalized understanding; patient denies any pain/distress. No wound noted, skin intact.

## 2013-11-17 NOTE — Telephone Encounter (Signed)
Just d/c from hospital .when does she restart revlimid? And next ov?Marland Kitchen Note to Dr Julien Nordmann

## 2013-11-19 ENCOUNTER — Inpatient Hospital Stay (HOSPITAL_COMMUNITY): Payer: Medicare Other

## 2013-11-19 ENCOUNTER — Encounter (HOSPITAL_COMMUNITY): Payer: Self-pay | Admitting: Emergency Medicine

## 2013-11-19 ENCOUNTER — Emergency Department (HOSPITAL_COMMUNITY): Payer: Medicare Other

## 2013-11-19 ENCOUNTER — Inpatient Hospital Stay (HOSPITAL_COMMUNITY)
Admission: EM | Admit: 2013-11-19 | Discharge: 2013-12-18 | DRG: 871 | Disposition: E | Payer: Medicare Other | Attending: Internal Medicine | Admitting: Internal Medicine

## 2013-11-19 DIAGNOSIS — R410 Disorientation, unspecified: Secondary | ICD-10-CM

## 2013-11-19 DIAGNOSIS — I5032 Chronic diastolic (congestive) heart failure: Secondary | ICD-10-CM | POA: Diagnosis present

## 2013-11-19 DIAGNOSIS — Z6832 Body mass index (BMI) 32.0-32.9, adult: Secondary | ICD-10-CM

## 2013-11-19 DIAGNOSIS — Z96659 Presence of unspecified artificial knee joint: Secondary | ICD-10-CM

## 2013-11-19 DIAGNOSIS — Z7982 Long term (current) use of aspirin: Secondary | ICD-10-CM

## 2013-11-19 DIAGNOSIS — E669 Obesity, unspecified: Secondary | ICD-10-CM

## 2013-11-19 DIAGNOSIS — E8809 Other disorders of plasma-protein metabolism, not elsewhere classified: Secondary | ICD-10-CM | POA: Diagnosis present

## 2013-11-19 DIAGNOSIS — G929 Unspecified toxic encephalopathy: Secondary | ICD-10-CM | POA: Diagnosis present

## 2013-11-19 DIAGNOSIS — I2510 Atherosclerotic heart disease of native coronary artery without angina pectoris: Secondary | ICD-10-CM | POA: Diagnosis present

## 2013-11-19 DIAGNOSIS — G4700 Insomnia, unspecified: Secondary | ICD-10-CM | POA: Diagnosis present

## 2013-11-19 DIAGNOSIS — IMO0001 Reserved for inherently not codable concepts without codable children: Secondary | ICD-10-CM | POA: Diagnosis present

## 2013-11-19 DIAGNOSIS — I4891 Unspecified atrial fibrillation: Secondary | ICD-10-CM | POA: Diagnosis present

## 2013-11-19 DIAGNOSIS — R7309 Other abnormal glucose: Secondary | ICD-10-CM | POA: Diagnosis not present

## 2013-11-19 DIAGNOSIS — J189 Pneumonia, unspecified organism: Secondary | ICD-10-CM | POA: Diagnosis present

## 2013-11-19 DIAGNOSIS — Z8249 Family history of ischemic heart disease and other diseases of the circulatory system: Secondary | ICD-10-CM

## 2013-11-19 DIAGNOSIS — Z87891 Personal history of nicotine dependence: Secondary | ICD-10-CM

## 2013-11-19 DIAGNOSIS — I509 Heart failure, unspecified: Secondary | ICD-10-CM | POA: Diagnosis present

## 2013-11-19 DIAGNOSIS — I5033 Acute on chronic diastolic (congestive) heart failure: Secondary | ICD-10-CM | POA: Diagnosis present

## 2013-11-19 DIAGNOSIS — J690 Pneumonitis due to inhalation of food and vomit: Secondary | ICD-10-CM | POA: Diagnosis present

## 2013-11-19 DIAGNOSIS — Z22322 Carrier or suspected carrier of Methicillin resistant Staphylococcus aureus: Secondary | ICD-10-CM

## 2013-11-19 DIAGNOSIS — G9340 Encephalopathy, unspecified: Secondary | ICD-10-CM

## 2013-11-19 DIAGNOSIS — R34 Anuria and oliguria: Secondary | ICD-10-CM | POA: Diagnosis present

## 2013-11-19 DIAGNOSIS — I739 Peripheral vascular disease, unspecified: Secondary | ICD-10-CM | POA: Diagnosis present

## 2013-11-19 DIAGNOSIS — D649 Anemia, unspecified: Secondary | ICD-10-CM | POA: Diagnosis present

## 2013-11-19 DIAGNOSIS — G92 Toxic encephalopathy: Secondary | ICD-10-CM | POA: Diagnosis present

## 2013-11-19 DIAGNOSIS — R6520 Severe sepsis without septic shock: Secondary | ICD-10-CM

## 2013-11-19 DIAGNOSIS — J449 Chronic obstructive pulmonary disease, unspecified: Secondary | ICD-10-CM | POA: Diagnosis present

## 2013-11-19 DIAGNOSIS — Z79899 Other long term (current) drug therapy: Secondary | ICD-10-CM

## 2013-11-19 DIAGNOSIS — N183 Chronic kidney disease, stage 3 unspecified: Secondary | ICD-10-CM | POA: Diagnosis present

## 2013-11-19 DIAGNOSIS — I252 Old myocardial infarction: Secondary | ICD-10-CM

## 2013-11-19 DIAGNOSIS — Z515 Encounter for palliative care: Secondary | ICD-10-CM

## 2013-11-19 DIAGNOSIS — K219 Gastro-esophageal reflux disease without esophagitis: Secondary | ICD-10-CM | POA: Diagnosis present

## 2013-11-19 DIAGNOSIS — D638 Anemia in other chronic diseases classified elsewhere: Secondary | ICD-10-CM

## 2013-11-19 DIAGNOSIS — J9600 Acute respiratory failure, unspecified whether with hypoxia or hypercapnia: Secondary | ICD-10-CM | POA: Diagnosis present

## 2013-11-19 DIAGNOSIS — Z7901 Long term (current) use of anticoagulants: Secondary | ICD-10-CM

## 2013-11-19 DIAGNOSIS — E785 Hyperlipidemia, unspecified: Secondary | ICD-10-CM | POA: Diagnosis present

## 2013-11-19 DIAGNOSIS — Z66 Do not resuscitate: Secondary | ICD-10-CM | POA: Diagnosis not present

## 2013-11-19 DIAGNOSIS — F411 Generalized anxiety disorder: Secondary | ICD-10-CM | POA: Diagnosis present

## 2013-11-19 DIAGNOSIS — D46C Myelodysplastic syndrome with isolated del(5q) chromosomal abnormality: Secondary | ICD-10-CM | POA: Diagnosis present

## 2013-11-19 DIAGNOSIS — D4620 Refractory anemia with excess of blasts, unspecified: Secondary | ICD-10-CM | POA: Diagnosis present

## 2013-11-19 DIAGNOSIS — D696 Thrombocytopenia, unspecified: Secondary | ICD-10-CM | POA: Diagnosis present

## 2013-11-19 DIAGNOSIS — Z951 Presence of aortocoronary bypass graft: Secondary | ICD-10-CM

## 2013-11-19 DIAGNOSIS — J4489 Other specified chronic obstructive pulmonary disease: Secondary | ICD-10-CM | POA: Diagnosis present

## 2013-11-19 DIAGNOSIS — J9601 Acute respiratory failure with hypoxia: Secondary | ICD-10-CM | POA: Diagnosis present

## 2013-11-19 DIAGNOSIS — A419 Sepsis, unspecified organism: Principal | ICD-10-CM | POA: Diagnosis present

## 2013-11-19 DIAGNOSIS — G894 Chronic pain syndrome: Secondary | ICD-10-CM | POA: Diagnosis present

## 2013-11-19 LAB — BLOOD GAS, ARTERIAL
Acid-base deficit: 3.4 mmol/L — ABNORMAL HIGH (ref 0.0–2.0)
Bicarbonate: 20.7 mEq/L (ref 20.0–24.0)
Drawn by: 103701
O2 Content: 2 L/min
O2 SAT: 97 %
PH ART: 7.384 (ref 7.350–7.450)
Patient temperature: 98.6
TCO2: 19.9 mmol/L (ref 0–100)
pCO2 arterial: 35.4 mmHg (ref 35.0–45.0)
pO2, Arterial: 92.2 mmHg (ref 80.0–100.0)

## 2013-11-19 LAB — CBC WITH DIFFERENTIAL/PLATELET
Basophils Absolute: 0.2 10*3/uL — ABNORMAL HIGH (ref 0.0–0.1)
Basophils Relative: 2 % — ABNORMAL HIGH (ref 0–1)
EOS PCT: 0 % (ref 0–5)
Eosinophils Absolute: 0 10*3/uL (ref 0.0–0.7)
HCT: 28.4 % — ABNORMAL LOW (ref 36.0–46.0)
HEMOGLOBIN: 9.1 g/dL — AB (ref 12.0–15.0)
Lymphocytes Relative: 43 % (ref 12–46)
Lymphs Abs: 3.7 10*3/uL (ref 0.7–4.0)
MCH: 29.4 pg (ref 26.0–34.0)
MCHC: 32 g/dL (ref 30.0–36.0)
MCV: 91.9 fL (ref 78.0–100.0)
Monocytes Absolute: 3.3 10*3/uL — ABNORMAL HIGH (ref 0.1–1.0)
Monocytes Relative: 37 % — ABNORMAL HIGH (ref 3–12)
NEUTROS PCT: 18 % — AB (ref 43–77)
Neutro Abs: 1.6 10*3/uL — ABNORMAL LOW (ref 1.7–7.7)
PLATELETS: 97 10*3/uL — AB (ref 150–400)
RBC: 3.09 MIL/uL — ABNORMAL LOW (ref 3.87–5.11)
RDW: 23.5 % — ABNORMAL HIGH (ref 11.5–15.5)
WBC: 8.8 10*3/uL (ref 4.0–10.5)

## 2013-11-19 LAB — URINALYSIS, ROUTINE W REFLEX MICROSCOPIC
Glucose, UA: NEGATIVE mg/dL
HGB URINE DIPSTICK: NEGATIVE
Ketones, ur: NEGATIVE mg/dL
Leukocytes, UA: NEGATIVE
NITRITE: NEGATIVE
PH: 6 (ref 5.0–8.0)
Protein, ur: 100 mg/dL — AB
SPECIFIC GRAVITY, URINE: 1.02 (ref 1.005–1.030)
Urobilinogen, UA: 2 mg/dL — ABNORMAL HIGH (ref 0.0–1.0)

## 2013-11-19 LAB — COMPREHENSIVE METABOLIC PANEL
ALBUMIN: 2.9 g/dL — AB (ref 3.5–5.2)
ALK PHOS: 122 U/L — AB (ref 39–117)
ALT: 14 U/L (ref 0–35)
AST: 38 U/L — ABNORMAL HIGH (ref 0–37)
BUN: 11 mg/dL (ref 6–23)
CO2: 21 mEq/L (ref 19–32)
Calcium: 8.6 mg/dL (ref 8.4–10.5)
Chloride: 102 mEq/L (ref 96–112)
Creatinine, Ser: 1.17 mg/dL — ABNORMAL HIGH (ref 0.50–1.10)
GFR calc Af Amer: 53 mL/min — ABNORMAL LOW (ref >90)
GFR calc non Af Amer: 45 mL/min — ABNORMAL LOW (ref >90)
Glucose, Bld: 110 mg/dL — ABNORMAL HIGH (ref 70–99)
POTASSIUM: 4.4 meq/L (ref 3.7–5.3)
Sodium: 140 mEq/L (ref 137–147)
TOTAL PROTEIN: 7.8 g/dL (ref 6.0–8.3)
Total Bilirubin: 1.1 mg/dL (ref 0.3–1.2)

## 2013-11-19 LAB — BLOOD GAS, VENOUS
Acid-base deficit: 1.9 mmol/L (ref 0.0–2.0)
BICARBONATE: 21.6 meq/L (ref 20.0–24.0)
O2 Content: 2 L/min
O2 Saturation: 75.6 %
PATIENT TEMPERATURE: 98.6
PH VEN: 7.428 — AB (ref 7.250–7.300)
TCO2: 20.5 mmol/L (ref 0–100)
pCO2, Ven: 33.3 mmHg — ABNORMAL LOW (ref 45.0–50.0)
pO2, Ven: 44 mmHg (ref 30.0–45.0)

## 2013-11-19 LAB — GLUCOSE, CAPILLARY: Glucose-Capillary: 108 mg/dL — ABNORMAL HIGH (ref 70–99)

## 2013-11-19 LAB — PROTIME-INR
INR: 3.78 — ABNORMAL HIGH (ref 0.00–1.49)
Prothrombin Time: 35.9 seconds — ABNORMAL HIGH (ref 11.6–15.2)

## 2013-11-19 LAB — MRSA PCR SCREENING: MRSA by PCR: POSITIVE — AB

## 2013-11-19 LAB — PROCALCITONIN: Procalcitonin: 4.26 ng/mL

## 2013-11-19 LAB — URINE MICROSCOPIC-ADD ON

## 2013-11-19 LAB — TSH: TSH: 3.31 u[IU]/mL (ref 0.350–4.500)

## 2013-11-19 LAB — I-STAT CG4 LACTIC ACID, ED: Lactic Acid, Venous: 2.07 mmol/L (ref 0.5–2.2)

## 2013-11-19 MED ORDER — GABAPENTIN 300 MG PO CAPS
600.0000 mg | ORAL_CAPSULE | Freq: Three times a day (TID) | ORAL | Status: DC
  Administered 2013-11-19: 600 mg via ORAL
  Filled 2013-11-19 (×2): qty 2

## 2013-11-19 MED ORDER — TIZANIDINE HCL 4 MG PO TABS
4.0000 mg | ORAL_TABLET | Freq: Three times a day (TID) | ORAL | Status: DC
  Administered 2013-11-19: 4 mg via ORAL
  Filled 2013-11-19 (×2): qty 1

## 2013-11-19 MED ORDER — ALBUTEROL SULFATE (2.5 MG/3ML) 0.083% IN NEBU: 2.5000 mg | INHALATION_SOLUTION | RESPIRATORY_TRACT | Status: DC | PRN

## 2013-11-19 MED ORDER — SODIUM CHLORIDE 0.9 % IV SOLN
1000.0000 mL | INTRAVENOUS | Status: DC
  Administered 2013-11-19: 1000 mL via INTRAVENOUS

## 2013-11-19 MED ORDER — DOCUSATE SODIUM 100 MG PO CAPS
200.0000 mg | ORAL_CAPSULE | Freq: Every day | ORAL | Status: DC
  Administered 2013-11-19: 200 mg via ORAL
  Filled 2013-11-19: qty 2

## 2013-11-19 MED ORDER — METOPROLOL TARTRATE 50 MG PO TABS
50.0000 mg | ORAL_TABLET | Freq: Two times a day (BID) | ORAL | Status: DC
  Filled 2013-11-19 (×2): qty 1

## 2013-11-19 MED ORDER — DOPAMINE-DEXTROSE 3.2-5 MG/ML-% IV SOLN
INTRAVENOUS | Status: AC
  Filled 2013-11-19: qty 250

## 2013-11-19 MED ORDER — SIMVASTATIN 20 MG PO TABS
20.0000 mg | ORAL_TABLET | Freq: Every day | ORAL | Status: DC
  Filled 2013-11-19: qty 1

## 2013-11-19 MED ORDER — ONDANSETRON HCL 4 MG/2ML IJ SOLN: 4.0000 mg | Freq: Three times a day (TID) | INTRAMUSCULAR | Status: DC | PRN

## 2013-11-19 MED ORDER — PHENYLEPHRINE HCL 10 MG/ML IJ SOLN
30.0000 ug/min | INTRAVENOUS | Status: DC
  Administered 2013-11-19: 30 ug/min via INTRAVENOUS
  Filled 2013-11-19: qty 1

## 2013-11-19 MED ORDER — WARFARIN - PHARMACIST DOSING INPATIENT: Freq: Every day | Status: DC

## 2013-11-19 MED ORDER — GABAPENTIN 600 MG PO TABS
600.0000 mg | ORAL_TABLET | Freq: Three times a day (TID) | ORAL | Status: DC
  Filled 2013-11-19 (×2): qty 1

## 2013-11-19 MED ORDER — MOMETASONE FURO-FORMOTEROL FUM 100-5 MCG/ACT IN AERO
2.0000 | INHALATION_SPRAY | Freq: Two times a day (BID) | RESPIRATORY_TRACT | Status: DC
  Filled 2013-11-19: qty 8.8

## 2013-11-19 MED ORDER — ONDANSETRON HCL 4 MG/2ML IJ SOLN: 4.0000 mg | Freq: Four times a day (QID) | INTRAMUSCULAR | Status: DC | PRN

## 2013-11-19 MED ORDER — DOPAMINE-DEXTROSE 3.2-5 MG/ML-% IV SOLN
2.0000 ug/kg/min | INTRAVENOUS | Status: DC
  Administered 2013-11-19: 5 ug/kg/min via INTRAVENOUS

## 2013-11-19 MED ORDER — VANCOMYCIN HCL IN DEXTROSE 1-5 GM/200ML-% IV SOLN
1000.0000 mg | INTRAVENOUS | Status: AC
  Administered 2013-11-19: 1000 mg via INTRAVENOUS
  Filled 2013-11-19: qty 200

## 2013-11-19 MED ORDER — VITAMIN K1 10 MG/ML IJ SOLN
5.0000 mg | Freq: Once | INTRAVENOUS | Status: AC
  Administered 2013-11-19: 5 mg via INTRAVENOUS
  Filled 2013-11-19: qty 0.5

## 2013-11-19 MED ORDER — ASPIRIN EC 81 MG PO TBEC
81.0000 mg | DELAYED_RELEASE_TABLET | Freq: Every day | ORAL | Status: DC
  Filled 2013-11-19: qty 1

## 2013-11-19 MED ORDER — NORTRIPTYLINE HCL 25 MG PO CAPS
50.0000 mg | ORAL_CAPSULE | Freq: Every day | ORAL | Status: DC
  Filled 2013-11-19: qty 2

## 2013-11-19 MED ORDER — METOPROLOL TARTRATE 1 MG/ML IV SOLN
2.5000 mg | INTRAVENOUS | Status: DC | PRN
  Administered 2013-11-19: 5 mg via INTRAVENOUS
  Administered 2013-11-21: 2.5 mg via INTRAVENOUS
  Filled 2013-11-19 (×3): qty 5

## 2013-11-19 MED ORDER — SODIUM CHLORIDE 0.9 % IV SOLN
1000.0000 mL | Freq: Once | INTRAVENOUS | Status: AC
  Administered 2013-11-19: 1000 mL via INTRAVENOUS

## 2013-11-19 MED ORDER — ALPRAZOLAM 0.25 MG PO TABS: 0.2500 mg | ORAL_TABLET | Freq: Two times a day (BID) | ORAL | Status: DC | PRN

## 2013-11-19 MED ORDER — HALOPERIDOL LACTATE 5 MG/ML IJ SOLN
1.0000 mg | INTRAMUSCULAR | Status: DC | PRN
  Administered 2013-11-19 – 2013-11-22 (×5): 4 mg via INTRAVENOUS
  Filled 2013-11-19 (×8): qty 1

## 2013-11-19 MED ORDER — SODIUM CHLORIDE 0.9 % IV SOLN: INTRAVENOUS | Status: DC

## 2013-11-19 MED ORDER — PIPERACILLIN-TAZOBACTAM 3.375 G IVPB
3.3750 g | Freq: Three times a day (TID) | INTRAVENOUS | Status: DC
  Administered 2013-11-19 – 2013-11-22 (×9): 3.375 g via INTRAVENOUS
  Filled 2013-11-19 (×11): qty 50

## 2013-11-19 MED ORDER — VANCOMYCIN HCL 10 G IV SOLR
1250.0000 mg | INTRAVENOUS | Status: DC
  Administered 2013-11-20: 1250 mg via INTRAVENOUS
  Filled 2013-11-19: qty 1250

## 2013-11-19 MED ORDER — IPRATROPIUM-ALBUTEROL 0.5-2.5 (3) MG/3ML IN SOLN
3.0000 mL | Freq: Four times a day (QID) | RESPIRATORY_TRACT | Status: DC
  Administered 2013-11-19 – 2013-11-22 (×8): 3 mL via RESPIRATORY_TRACT
  Filled 2013-11-19 (×10): qty 3

## 2013-11-19 MED ORDER — ACETAMINOPHEN 650 MG RE SUPP
650.0000 mg | Freq: Four times a day (QID) | RECTAL | Status: DC | PRN
  Administered 2013-11-19 – 2013-11-23 (×5): 650 mg via RECTAL
  Filled 2013-11-19 (×5): qty 1

## 2013-11-19 MED ORDER — PANTOPRAZOLE SODIUM 40 MG PO TBEC
40.0000 mg | DELAYED_RELEASE_TABLET | Freq: Every day | ORAL | Status: DC
  Administered 2013-11-19: 40 mg via ORAL
  Filled 2013-11-19: qty 1

## 2013-11-19 MED ORDER — ALBUTEROL SULFATE (2.5 MG/3ML) 0.083% IN NEBU
2.5000 mg | INHALATION_SOLUTION | RESPIRATORY_TRACT | Status: DC | PRN
  Administered 2013-11-22: 2.5 mg via RESPIRATORY_TRACT
  Filled 2013-11-19: qty 3

## 2013-11-19 MED ORDER — PIPERACILLIN-TAZOBACTAM 3.375 G IVPB 30 MIN
3.3750 g | INTRAVENOUS | Status: AC
  Administered 2013-11-19: 3.375 g via INTRAVENOUS
  Filled 2013-11-19: qty 50

## 2013-11-19 MED ORDER — SODIUM CHLORIDE 0.9 % IV BOLUS (SEPSIS)
1000.0000 mL | Freq: Once | INTRAVENOUS | Status: AC
  Administered 2013-11-19: 1000 mL via INTRAVENOUS

## 2013-11-19 MED ORDER — NALOXONE HCL 0.4 MG/ML IJ SOLN
INTRAMUSCULAR | Status: AC
  Administered 2013-11-19: 0.4 mg
  Filled 2013-11-19: qty 1

## 2013-11-19 MED ORDER — NALOXONE HCL 0.4 MG/ML IJ SOLN: 0.4000 mg | INTRAMUSCULAR | Status: DC | PRN

## 2013-11-19 MED ORDER — NORTRIPTYLINE HCL 25 MG PO CAPS: 50.0000 mg | ORAL_CAPSULE | Freq: Every day | ORAL | Status: DC

## 2013-11-19 NOTE — Progress Notes (Signed)
CRITICAL VALUE ALERT  Critical value received:  Positive MRSA  Date of notification: 08-Dec-2013  Time of notification: 1200  Critical value read back:  Yes Nurse who received alert: Jacklynn Lewis RN  MD notified (1st page):  Time of first page:   MD notified (2nd page):  Time of second page:  Responding MD:   Time MD responded:

## 2013-11-19 NOTE — H&P (Signed)
[name] History and Physical  Louetta Hollingshead LKG:[id number] DOB: 1941/03/06 DOA: 12/06/2013  Referring physician: EDP PCP: Noralee Space, MD   Chief Complaint: not acting right  HPI: [name] is a 73 y.o. 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 Was also at [facility] 2 weeks prior for same. History is limited since she is confused and unable to contact spouse at this time, per daughter in law, she has been confused, and delirious, difficult to manage since discharge home, she reports compliance with home medications, she reportedly hasnt slept a wink in 2 days. Was brought back to the ER today, Initially vitals with sats of 80% on RA, improved with O2, CXR with persistent infiltrates, temp of 99.4, delirious     Review of Systems:  Constitutional:  No weight loss, night sweats, Fevers, chills, fatigue.  HEENT:  No headaches, Difficulty swallowing,Tooth/dental problems,Sore throat,  No sneezing, itching, ear ache, nasal congestion, post nasal drip,  Cardio-vascular:  No chest pain, Orthopnea, PND, swelling in lower extremities, anasarca, dizziness, palpitations  GI:  No heartburn, indigestion, abdominal pain, nausea, vomiting, diarrhea, change in bowel habits, loss of appetite  Resp:  No shortness of breath with exertion or at rest. No excess mucus, no productive cough, No non-productive cough, No coughing up of blood.No change in color of mucus.No wheezing.No chest wall deformity  Skin:  no rash or lesions.  GU:  no dysuria, change in color of urine, no urgency or frequency. No flank pain.  Musculoskeletal:  No joint pain or swelling. No decreased range of motion. No back pain.  Psych:  No change in mood or affect. No depression or anxiety. No memory loss.    Past Medical History  Diagnosis Date  . Coronary atherosclerosis of unspecified type of vessel, native or graft   . Atrial fibrillation 01/19/2012    stress test - non-gated secondary to A fib,  normal myocaridal perfusion study  . Unspecified venous (peripheral) insufficiency   . Other and unspecified hyperlipidemia   . Morbid obesity   . Esophageal reflux   . Irritable bowel syndrome   . Other symptoms involving urinary system(788.99)   . Chronic pain syndrome   . 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 Anxiety state, unspecified   . Herpes zoster without mention of complication   . Herpes zoster with other nervous system complications(053.19)   . Lumbago   . Anticoagulated on Coumadin, chronic for A. Fib 04/08/2012  . COPD (chronic obstructive pulmonary disease)   . 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 Pneumonia 4 years ago  . Bronchitis, chronic   . Fibromyalgia   . Arthritis   . CAD (coronary artery disease) 04/29/2006    echo -EF 45-50%, impaired LV relaxation  . 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 GI bleed 04/07/2012    endoscopy/colonoscopy unremarkable  . 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 DJD (degenerative joint disease)   . Myocardial infarction   . Anemia    Past Surgical History  Procedure Laterality Date  . Vesicovaginal fistula closure w/ tah    . Cardiac catheterization  01/06/2010    RCA mid artery stenosis at 99%, LAD proximal occlusion 90%  at origin of ramus  .  Nissen fundoplication    . Esophagogastroduodenoscopy  04/09/2012    Procedure: ESOPHAGOGASTRODUODENOSCOPY (EGD);  Surgeon: Beryle Beams, MD;  Location[facility] ENDOSCOPY;  Service: Endoscopy;  Laterality: N/A;  tentatively scheduled per Trula Slade due to patients breathing problems  . Colonoscopy  04/11/2012    Procedure: COLONOSCOPY;  Surgeon: Ladene Artist, MD,FACG;  Location: [facility] ENDOSCOPY;  Service:  Endoscopy;  Laterality: N/A;  . Abdominal hysterectomy  at 73 years old  . Appendectomy  about 50 years ago  . Coronary artery bypass graft  1997    LIMA to LAD, SVG to ramus, SVG to RCA  . Cholecystectomy  50 years ago  . Neck surgery  73 years old    full movement  . Right knee arthroscopy  3 years ago  . Eye surgery  2005    cataracts both eyes  . Back surgery      x5, "birdcage" and fused in lower back  . Total knee arthroplasty  06/28/2012    Procedure: TOTAL KNEE ARTHROPLASTY;  Surgeon: Sydnee Cabal, MD;  Location: [facility] ORS;  Service: Orthopedics;  Laterality: Left;  . Joint replacement Left 06/28/12    knee replacement   Social History:  reports that she quit smoking about 29 years ago. Her smoking use included Cigarettes. She has a 22 pack-year smoking history. She has never used smokeless tobacco. She reports that she does not drink alcohol or use illicit drugs.  No Known Allergies  Family History  Problem Relation Age of Onset  . Heart disease Mother   . Lung disease Father   . Kidney disease       Prior to Admission medications   Medication Sig Start Date End Date Taking? Authorizing Provider  albuterol (PROVENTIL) (2.5 MG/3ML) 0.083% nebulizer solution Take 2.5 mg by nebulization every 6 (six) hours as needed for wheezing or shortness of breath.   Yes Historical Provider, MD  ALPRAZolam Duanne Moron) 0.5 MG tablet Take 0.25-0.5 mg by mouth 4 (four) times daily as needed for anxiety.   Yes Historical Provider, MD  amoxicillin-clavulanate (AUGMENTIN) 875-125 MG per tablet Take 1 tablet by mouth 2 (two) times daily. 4 TABLETS DISPENSED, FOR A TWO DAY COURSE , STARTED ON 11-18-13 RECEIVED FULL DOSE ON DAY 1, TWO TABLETS REMAIN 11/17/13  Yes Historical Provider, MD  aspirin EC 81 MG tablet Take 81 mg by mouth daily after supper.    Yes Historical Provider, MD  Calcium Carbonate-Vitamin D (CALCIUM-VITAMIN D) 500-200 MG-UNIT per tablet Take 1 tablet by mouth 2 (two) times daily with a  meal.   Yes Historical Provider, MD  docusate sodium (COLACE) 100 MG capsule Take 200 mg by mouth daily.    Yes Historical Provider, MD  ferrous sulfate 325 (65 FE) MG tablet Take 325 mg by mouth every evening.   Yes Historical Provider, MD  Fluticasone-Salmeterol (ADVAIR) 250-50 MCG/DOSE AEPB Inhale 1 puff into the lungs 2 (two) times daily.   Yes Historical Provider, MD  furosemide (LASIX) 40 MG tablet Take 40 mg by mouth 2 (two) times daily.    Yes Historical Provider, MD  gabapentin (NEURONTIN) 600 MG tablet Take 600 mg by mouth 3 (three) times daily. 10/26/13  Yes Domenic Polite, MD  isosorbide mononitrate (IMDUR) 30 MG 24 hr tablet Take 30 mg by mouth at bedtime.   Yes Historical Provider, MD  lenalidomide (REVLIMID) 10 MG capsule Take 10 mg by mouth daily. TAKE 1 CAPSULE FOR 21 DAYS , EVERY 4 WEEKS   Yes Historical  Provider, MD  metoprolol (LOPRESSOR) 50 MG tablet Take 50 mg by mouth 2 (two) times daily.   Yes Historical Provider, MD  Multiple Vitamin (MULTIVITAMIN WITH MINERALS) TABS Take 1 tablet by mouth every morning.    Yes Historical Provider, MD  nitroGLYCERIN (NITROSTAT) 0.4 MG SL tablet Place 0.4 mg under the tongue every 5 (five) minutes x 3 doses as needed for chest pain.    Yes Historical Provider, MD  nortriptyline (PAMELOR) 50 MG capsule Take 50 mg by mouth at bedtime.   Yes Historical Provider, MD  omeprazole (PRILOSEC) 20 MG capsule Take 20 mg by mouth 2 (two) times daily.   Yes Historical Provider, MD  ondansetron (ZOFRAN) 4 MG tablet Take 4 mg by mouth every 8 (eight) hours as needed for nausea or vomiting.   Yes Historical Provider, MD  oxyCODONE-acetaminophen (PERCOCET/ROXICET) 5-325 MG per tablet Take 1-2 tablets by mouth every 4 (four) hours as needed for severe pain.    Yes Historical Provider, MD  potassium chloride SA (K-DUR,KLOR-CON) 20 MEQ tablet Take 20 mEq by mouth 2 (two) times daily.   Yes Historical Provider, MD  simvastatin (ZOCOR) 20 MG tablet Take 20 mg by  mouth at bedtime.   Yes Historical Provider, MD  tiZANidine (ZANAFLEX) 4 MG tablet Take 4 mg by mouth 3 (three) times daily.  08/26/13  Yes Historical Provider, MD  warfarin (COUMADIN) 5 MG tablet Take 2.5-5 mg by mouth every morning. She takes half a tablet on Monday, Wednesday, Friday and one tablet on Tuesday, Thursday, Saturday and Sunday.   Yes Historical Provider, MD   Physical Exam: Filed Vitals:   12/05/2013 1030  BP: 135/72  Pulse: 88  Temp:   Resp: 20    BP 135/72  Pulse 88  Temp(Src) 99 F (37.2 C) (Oral)  Resp 20  Ht 5' 7.5" (1.715 m)  Wt 90.719 kg (200 lb)  BMI 30.84 kg/m2  SpO2 98%  General:  Appears calm and comfortable, but delirious Eyes: PERRL, normal lids, irises & conjunctiva ENT: grossly normal hearing, lips & tongue Neck: no LAD, masses or thyromegaly Cardiovascular: RRR, no m/r/g. No LE edema. Respiratory: scattered ronchi no wheezing Abdomen: soft, ntnd Skin: no rash or induration seen on limited exam Musculoskeletal: grossly normal tone BUE/BLE Psychiatric: grossly normal mood and affect, speech fluent and appropriate Neurologic: grossly non-focal.          Labs on Admission:  Basic Metabolic Panel:  Recent Labs Lab 11/13/13 0314 11/14/13 0235 11/14/13 0420 11/15/13 0510 11/29/2013 0735  NA 141 136*  --  140 140  K 4.2 4.5  --  4.4 4.4  CL 106 101  --  106 102  CO2 22 21  --  23 21  GLUCOSE 112* 90  --  94 110*  BUN 16 12  --  13 11  CREATININE 1.21* 1.14*  --  1.13* 1.17*  CALCIUM 7.7* 7.9*  --  8.5 8.6  MG  --   --  1.6  --   --    Liver Function Tests:  Recent Labs Lab 12/10/2013 0735  AST 38*  ALT 14  ALKPHOS 122*  BILITOT 1.1  PROT 7.8  ALBUMIN 2.9*   No results found for this basename: LIPASE, AMYLASE,  in the last 168 hours No results found for this basename: AMMONIA,  in the last 168 hours CBC:  Recent Labs Lab 11/13/13 0314  11/14/13 0235 11/15/13 0510 11/16/13 0446 11/17/13 1321 11/18/2013 0735  WBC 3.2*  --  4.5 4.5  --   --  8.8  NEUTROABS  --   --  0.9*  --   --   --  1.6*  HGB 6.9*  < > 8.2* 7.9* 7.7* 9.0* 9.1*  HCT 22.3*  < > 26.0* 25.4* 24.1* 28.6* 28.4*  MCV 98.2  --  96.3 96.6  --   --  91.9  PLT 28*  --  45* 42*  --   --  97*  < > = values in this interval not displayed. Cardiac Enzymes: No results found for this basename: CKTOTAL, CKMB, CKMBINDEX, TROPONINI,  in the last 168 hours  BNP (last 3 results)  Recent Labs  09/03/13 1500 09/21/13 1125 10/22/13 0844  PROBNP 14053.0* 203.0* 5985.0*   CBG: No results found for this basename: GLUCAP,  in the last 168 hours  Radiological Exams on Admission: Dg Chest Port 1 View  12/13/2013   CLINICAL DATA:  Shortness of breath.  EXAM: PORTABLE CHEST - 1 VIEW  COMPARISON:  11/16/2013  FINDINGS: Previous median sternotomy and CABG procedure. There is moderate cardiac enlargement. Persistent patchy airspace opacities within the left lung are identified. There is mild superimposed pulmonary edema.  IMPRESSION: 1. Cardiac enlargement and mild edema suggest CHF. 2. Persistent left lung opacities compatible with multifocal infection.   Electronically Signed   By: Kerby Moors M.D.   On: 11/22/2013 08:27    Assessment/Plan  1. HCAP/Hypoxia -admit, start [employer] Abx-Vanc./Zosyn -FU blood and sputum Cx -due to recurrent pneumonia, will check CT chest and requested Pulm eval -s/p speech eval last admission: unremarkable -Duonebs QID and PRN  2. COPD -stable, as noted above -nebs scheduled and PRN  3. Metabolic encephalopathy -due to 1 -but also suspect she may have early dementia/cognitive dysfunction-due to chronicity of her cognitive deccline -also check TSH, B12 -UA and urine Cx pending  4. H/o MDS and plasma cell dyscrasia -followed by Dr.Mohamed -hold revlimid  5. Pafib -rate controlled -continue Metoprolol and coumadin per pharmacy-INR up today  6. Chronic diastolic CHF Gentle iVf for now, lasix on hold  DVT proph:  on anticoagulation  Code Status: unable to contact husband to confirm this, will need to be clarified again, Partial Code last admission Family Communication: left voicemail for husband Glendell Docker @ [phone] Disposition Plan: admit to SDU  Time spent: 67min  Shantaya Bluestone [name] Pager [phone]

## 2013-11-19 NOTE — Progress Notes (Signed)
ANTIBIOTIC CONSULT NOTE - INITIAL  Pharmacy Consult for vancomycin, Zosyn Indication: pneumonia and rule out sepsis  No Known Allergies  Patient Measurements:   Adjusted Body Weight: 71kg (using weight of 92.2kg from 5/1)  Vital Signs: Temp: 99.4 F (37.4 C) (05/03 0722) Temp src: Oral (05/03 0722) BP: 111/77 mmHg (05/03 0722) Pulse Rate: 105 (05/03 0722) Intake/Output from previous day:   Intake/Output from this shift:    Labs:  Recent Labs  11/17/13 1321 12/15/2013 0735  WBC  --  8.8  HGB 9.0* 9.1*  PLT  --  97*  CREATININE  --  1.17*   The CrCl is unknown because both a height and weight (above a minimum accepted value) are required for this calculation. No results found for this basename: VANCOTROUGH, VANCOPEAK, VANCORANDOM, [place], GENTPEAK, GENTRANDOM, [place], TOBRAPEAK, TOBRARND, AMIKACINPEAK, AMIKACINTROU, AMIKACIN,  in the last 72 hours   Microbiology: Recent Results (from the past 720 hour(s))  CULTURE, BLOOD (ROUTINE X 2)     Status: None   Collection Time    10/22/13  8:44 AM      Result Value Ref Range Status   Specimen Description BLOOD RIGHT ANTECUBITAL   Final   Special Requests BOTTLES DRAWN AEROBIC AND ANAEROBIC 5 CC EACH   Final   Culture  Setup Time     Final   Value: 10/22/2013 15:20     Performed at [facility]   Culture     Final   Value: NO GROWTH 5 DAYS     Performed at [facility]   Report Status 10/28/2013 FINAL   Final  CULTURE, BLOOD (ROUTINE X 2)     Status: None   Collection Time    10/22/13  8:44 AM      Result Value Ref Range Status   Specimen Description BLOOD BLOOD LEFT FOREARM   Final   Special Requests BOTTLES DRAWN AEROBIC AND ANAEROBIC 4 CC [facility]   Final   Culture  Setup Time     Final   Value: 10/22/2013 15:20     Performed at [facility]   Culture     Final   Value: NO GROWTH 5 DAYS     Performed at [facility]   Report Status 10/28/2013 FINAL   Final  URINE CULTURE      Status: None   Collection Time    10/22/13 11:41 AM      Result Value Ref Range Status   Specimen Description URINE, CLEAN CATCH   Final   Special Requests NONE   Final   Culture  Setup Time     Final   Value: 10/22/2013 19:23     Performed at Agency     Final   Value: NO GROWTH     Performed at [facility]   Culture     Final   Value: NO GROWTH     Performed at [facility]   Report Status 10/23/2013 FINAL   Final  MRSA PCR SCREENING     Status: Abnormal   Collection Time    10/22/13 11:43 AM      Result Value Ref Range Status   MRSA by PCR POSITIVE (*) NEGATIVE Final   Comment:            The GeneXpert MRSA Assay (FDA     approved for NASAL specimens     only), is one component of a  comprehensive MRSA colonization     surveillance program. It is not     intended to diagnose MRSA     infection nor to guide or     monitor treatment for     MRSA infections.     RESULT CALLED TO, READ BACK BY AND VERIFIED WITH:     D.VAUDREUIL AT 1504 ON [id number] BY C.BONGEL  CULTURE, EXPECTORATED SPUTUM-ASSESSMENT     Status: None   Collection Time    10/23/13 11:10 AM      Result Value Ref Range Status   Specimen Description SPUTUM   Final   Special Requests NONE   Final   Sputum evaluation     Final   Value: THIS SPECIMEN IS ACCEPTABLE. RESPIRATORY CULTURE REPORT TO FOLLOW.   Report Status 10/23/2013 FINAL   Final  CULTURE, RESPIRATORY (NON-EXPECTORATED)     Status: None   Collection Time    10/23/13 11:10 AM      Result Value Ref Range Status   Specimen Description SPUTUM   Final   Special Requests NONE   Final   Gram Stain     Final   Value: NO WBC SEEN     NO SQUAMOUS EPITHELIAL CELLS SEEN     NO ORGANISMS SEEN     Performed at [facility]   Culture     Final   Value: NORMAL OROPHARYNGEAL FLORA     Performed at [facility]   Report Status 10/25/2013 FINAL   Final  TECHNOLOGIST REVIEW     Status: None    Collection Time    11/02/13 10:33 AM      Result Value Ref Range Status   Technologist Review 2% blast, rare nrbc, Metas and Myelocytes present   Final  CULTURE, BLOOD (ROUTINE X 2)     Status: None   Collection Time    11/11/13  6:26 AM      Result Value Ref Range Status   Specimen Description BLOOD RIGHT WRIST   Final   Special Requests BOTTLES DRAWN AEROBIC AND ANAEROBIC [facility] EACH   Final   Culture  Setup Time     Final   Value: 11/11/2013 11:20     Performed at [facility]   Culture     Final   Value: NO GROWTH 5 DAYS     Performed at [facility]   Report Status 11/17/2013 FINAL   Final  CULTURE, BLOOD (ROUTINE X 2)     Status: None   Collection Time    11/11/13  6:35 AM      Result Value Ref Range Status   Specimen Description BLOOD LEFT HAND   Final   Special Requests BOTTLES DRAWN AEROBIC AND ANAEROBIC [facility] EACH   Final   Culture  Setup Time     Final   Value: 11/11/2013 11:20     Performed at [facility]   Culture     Final   Value: NO GROWTH 5 DAYS     Performed at [facility]   Report Status 11/17/2013 FINAL   Final  URINE CULTURE     Status: None   Collection Time    11/11/13  7:29 AM      Result Value Ref Range Status   Specimen Description URINE, CLEAN CATCH   Final   Special Requests NONE   Final   Culture  Setup Time     Final   Value: 11/11/2013 12:18  Performed at [place]     Final   Value: 4,000 COLONIES/ML     Performed at [facility]     Final   Value: INSIGNIFICANT GROWTH     Performed at [facility]   Report Status 11/12/2013 FINAL   Final  RAPID STREP SCREEN     Status: None   Collection Time    11/11/13 11:51 AM      Result Value Ref Range Status   Streptococcus, Group A Screen (Direct) NEGATIVE  NEGATIVE Final   Comment: (NOTE)     A Rapid Antigen test may result negative if the antigen level in the     sample is below the detection level of this  test. The FDA has not     cleared this test as a stand-alone test therefore the rapid antigen     negative result has reflexed to a Group A Strep culture.  CULTURE, GROUP A STREP     Status: None   Collection Time    11/11/13 11:51 AM      Result Value Ref Range Status   Specimen Description THROAT   Final   Special Requests NONE   Final   Culture     Final   Value: No Beta Hemolytic Streptococci Isolated     Performed at [facility]   Report Status 11/13/2013 FINAL   Final  MRSA PCR SCREENING     Status: None   Collection Time    11/11/13  7:02 PM      Result Value Ref Range Status   MRSA by PCR NEGATIVE  NEGATIVE Final   Comment:            The GeneXpert MRSA Assay (FDA     approved for NASAL specimens     only), is one component of a     comprehensive MRSA colonization     surveillance program. It is not     intended to diagnose MRSA     infection nor to guide or     monitor treatment for     MRSA infections.  CULTURE, EXPECTORATED SPUTUM-ASSESSMENT     Status: None   Collection Time    11/13/13  4:03 PM      Result Value Ref Range Status   Specimen Description SPUTUM   Final   Special Requests Immunocompromised   Final   Sputum evaluation     Final   Value: THIS SPECIMEN IS ACCEPTABLE. RESPIRATORY CULTURE REPORT TO FOLLOW.   Report Status 11/13/2013 FINAL   Final  CULTURE, RESPIRATORY (NON-EXPECTORATED)     Status: None   Collection Time    11/13/13  4:03 PM      Result Value Ref Range Status   Specimen Description SPUTUM   Final   Special Requests NONE   Final   Gram Stain     Final   Value: NO WBC SEEN     NO SQUAMOUS EPITHELIAL CELLS SEEN     NO ORGANISMS SEEN     Performed at [facility]   Culture     Final   Value: RARE FUNGUS (MOLD) ISOLATED, PROBABLE CONTAMINANT/COLONIZER (SAPROPHYTE). CONTACT MICROBIOLOGY IF FURTHER IDENTIFICATION REQUIRED [phone].     Performed at [facility]   Report Status 11/16/2013 FINAL   Final   CULTURE, BLOOD (ROUTINE X 2)     Status: None   Collection Time    11/14/13  2:50 AM      Result Value Ref Range Status   Specimen Description BLOOD RIGHT ARM   Final   Special Requests BOTTLES DRAWN AEROBIC AND ANAEROBIC 5CC   Final   Culture  Setup Time     Final   Value: 11/14/2013 09:35     Performed at [facility]   Culture     Final   Value:        BLOOD CULTURE RECEIVED NO GROWTH TO DATE CULTURE WILL BE HELD FOR 5 DAYS BEFORE ISSUING A FINAL NEGATIVE REPORT     Performed at [facility]   Report Status PENDING   Incomplete  CULTURE, BLOOD (ROUTINE X 2)     Status: None   Collection Time    11/14/13  2:55 AM      Result Value Ref Range Status   Specimen Description BLOOD RIGHT HAND   Final   Special Requests BOTTLES DRAWN AEROBIC ONLY 3CC   Final   Culture  Setup Time     Final   Value: 11/14/2013 09:35     Performed at [facility]   Culture     Final   Value:        BLOOD CULTURE RECEIVED NO GROWTH TO DATE CULTURE WILL BE HELD FOR 5 DAYS BEFORE ISSUING A FINAL NEGATIVE REPORT     Performed at [facility]   Report Status PENDING   Incomplete    Medical History: Past Medical History  Diagnosis Date  . Coronary atherosclerosis of unspecified type of vessel, native or graft   . Atrial fibrillation 01/19/2012    stress test - non-gated secondary to A fib,  normal myocaridal perfusion study  . Unspecified venous (peripheral) insufficiency   . Other and unspecified hyperlipidemia   . Morbid obesity   . Esophageal reflux   . Irritable bowel syndrome   . Other symptoms involving urinary system(788.99)   . Chronic pain syndrome   . 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 Anxiety state, unspecified   . Herpes zoster without mention of complication   . Herpes zoster with other nervous system complications(053.19)   . Lumbago   . Anticoagulated on Coumadin, chronic for A. Fib 04/08/2012  . COPD (chronic obstructive pulmonary  disease)   . 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 Pneumonia 4 years ago  . Bronchitis, chronic   . Fibromyalgia   . Arthritis   . CAD (coronary artery disease) 04/29/2006    echo -EF 45-50%, impaired LV relaxation  . 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 GI bleed 04/07/2012    endoscopy/colonoscopy unremarkable  . 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 DJD (degenerative joint disease)   . Myocardial infarction   . Anemia     Medications:  Scheduled:   Infusions:  . sodium chloride 1,000 mL (12/03/2013 0925)  . vancomycin 1,000 mg (2013/12/03 6734)   Assessment: 73 yo female presenting to ER with AMS, possible sepsis and found to likely have continued HAP to start vancomycin and Zosyn per pharmacy dosing. Patient just discharged from [facility] on 5/1 on PO Augmentin after being treated with vancomycin and Zosyn during the admission. Patient also was on vancomycin at beginning of April and a trough value of 27.3 supratherapeutic resulted from dosing of 750mg  IV q12. Subsequently, patient was started on Vancomycin 1250mg  IV q24 on 4/25 with next admission  and a trough was never drawn before vanc was d/c'd after 3 doses and Zosyn continued until discharge on 5/1. Baseline Labs: SCr 1.17 with estimated CrCl of 49 ml/min, WBC 8.8 and afebrile  5/3 >> Vancomcyin >> 5/3 >> Zosyn >>  Goal of Therapy:  Vancomycin trough level 15-20 mcg/ml  Plan:  1) Zosyn 3.375g IV q8 (extended interval infusion) for CrCl > 20 ml/min 2) Vancomycin 1250mg  IV q24 based on current renal function as well as previous vanc dosing per above   Adrian Saran, PharmD, BCPS Pager [phone] 12/16/2013 9:28 AM

## 2013-11-19 NOTE — Progress Notes (Signed)
eLink Physician-Brief Progress Note Patient Name: [name] DOB: 25-Mar-1941 MRN: [id number]  Date of Service  12-13-2013   HPI/Events of Note     eICU Interventions  Haldol prn Use ativan if breakthrough (uses xanax at home)    Intervention Category Major Interventions: Delirium, psychosis, severe agitation - evaluation and management  Rigoberto Noel Dec 13, 2013, 11:36 PM

## 2013-11-19 NOTE — Consult Note (Addendum)
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 Pna/tme and pulm/ccm initially asked to see for ? Why recurrent pna but on arrival noted pt obtunded (around 2pm p foley place) and converted to ccm consult.   SIGNIFICANT EVENTS / STUDIES:  CT chest  5/3 >>> CT Head 5/3 >>> UC 5/3 >>> Procalcitonin protocol 5/3 >> Cortisol level 5/3 >>  LINES / TUBES:    CULTURES: BC x 2 5/3 >>> MRSA Screen 5/3 > POS   ANTIBIOTICS: Zosyn 5/3 >>> Vanc IV 5/3 >>>  HISTORY OF PRESENT ILLNESS:    No additional hx from pt and daughter in law at bedside gives hx that " she hasn't been herself since xmas" very debilitated and has everything done for her by her husband. When gets percocet goes out for hours per records ? When last dose?   Fm not aware of any abd pain, HA, n or V or D or cp/ sob and previously not on 02   PAST MEDICAL HISTORY :  Past Medical History  Diagnosis Date  . Coronary atherosclerosis of unspecified type of vessel, native or graft   . Atrial fibrillation 01/19/2012    stress test - non-gated secondary to A fib,  normal myocaridal perfusion study  . Unspecified venous (peripheral) insufficiency   . Other and unspecified hyperlipidemia   . Morbid obesity   . Esophageal reflux   . Irritable bowel syndrome   . Other symptoms involving urinary system(788.99)   . Chronic pain syndrome   . 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 Anxiety state, unspecified   . Herpes zoster without mention of  complication   . Herpes zoster with other nervous system complications(053.19)   . Lumbago   . Anticoagulated on Coumadin, chronic for A. Fib 04/08/2012  . COPD (chronic obstructive pulmonary disease)   . 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 Pneumonia 4 years ago  . Bronchitis, chronic   . Fibromyalgia   . Arthritis   . CAD (coronary artery disease) 04/29/2006    echo -EF 45-50%, impaired LV relaxation  . 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 GI bleed 04/07/2012    endoscopy/colonoscopy unremarkable  . 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 DJD (degenerative joint disease)   . Myocardial infarction   . Anemia    Past Surgical History  Procedure Laterality Date  . Vesicovaginal fistula closure w/ tah    . Cardiac catheterization  01/06/2010    RCA mid artery stenosis at 99%, LAD proximal occlusion 90%  at origin of ramus  . Nissen fundoplication    . Esophagogastroduodenoscopy  04/09/2012    Procedure: ESOPHAGOGASTRODUODENOSCOPY (EGD);  Surgeon: Beryle Beams, MD;  Location: [facility] ENDOSCOPY;  Service: Endoscopy;  Laterality: N/A;  tentatively scheduled per Trula Slade due to patients breathing problems  . Colonoscopy  04/11/2012    Procedure: COLONOSCOPY;  Surgeon: Norberto Sorenson  Sindy Guadeloupe, MD,FACG;  Location: [facility] ENDOSCOPY;  Service: Endoscopy;  Laterality: N/A;  . Abdominal hysterectomy  at 73 years old  . Appendectomy  about 50 years ago  . Coronary artery bypass graft  1997    LIMA to LAD, SVG to ramus, SVG to RCA  . Cholecystectomy  50 years ago  . Neck surgery  73 years old    full movement  . Right knee arthroscopy  3 years ago  . Eye surgery  2005    cataracts both eyes  . Back surgery      x5, "birdcage" and fused in lower back  . Total knee arthroplasty  06/28/2012    Procedure: TOTAL KNEE ARTHROPLASTY;  Surgeon: Sydnee Cabal, MD;  Location: [facility] ORS;  Service: Orthopedics;  Laterality: Left;  . Joint  replacement Left 06/28/12    knee replacement   Prior to Admission medications   Medication Sig Start Date End Date Taking? Authorizing Provider  albuterol (PROVENTIL) (2.5 MG/3ML) 0.083% nebulizer solution Take 2.5 mg by nebulization every 6 (six) hours as needed for wheezing or shortness of breath.   Yes Historical Provider, MD  ALPRAZolam Duanne Moron) 0.5 MG tablet Take 0.25-0.5 mg by mouth 4 (four) times daily as needed for anxiety.   Yes Historical Provider, MD  amoxicillin-clavulanate (AUGMENTIN) 875-125 MG per tablet Take 1 tablet by mouth 2 (two) times daily. 4 TABLETS DISPENSED, FOR A TWO DAY COURSE , STARTED ON 11-18-13 RECEIVED FULL DOSE ON DAY 1, TWO TABLETS REMAIN 11/17/13  Yes Historical Provider, MD  aspirin EC 81 MG tablet Take 81 mg by mouth daily after supper.    Yes Historical Provider, MD  Calcium Carbonate-Vitamin D (CALCIUM-VITAMIN D) 500-200 MG-UNIT per tablet Take 1 tablet by mouth 2 (two) times daily with a meal.   Yes Historical Provider, MD  docusate sodium (COLACE) 100 MG capsule Take 200 mg by mouth daily.    Yes Historical Provider, MD  ferrous sulfate 325 (65 FE) MG tablet Take 325 mg by mouth every evening.   Yes Historical Provider, MD  Fluticasone-Salmeterol (ADVAIR) 250-50 MCG/DOSE AEPB Inhale 1 puff into the lungs 2 (two) times daily.   Yes Historical Provider, MD  furosemide (LASIX) 40 MG tablet Take 40 mg by mouth 2 (two) times daily.    Yes Historical Provider, MD  gabapentin (NEURONTIN) 600 MG tablet Take 600 mg by mouth 3 (three) times daily. 10/26/13  Yes Domenic Polite, MD  isosorbide mononitrate (IMDUR) 30 MG 24 hr tablet Take 30 mg by mouth at bedtime.   Yes Historical Provider, MD  lenalidomide (REVLIMID) 10 MG capsule Take 10 mg by mouth daily. TAKE 1 CAPSULE FOR 21 DAYS , EVERY 4 WEEKS   Yes Historical Provider, MD  metoprolol (LOPRESSOR) 50 MG tablet Take 50 mg by mouth 2 (two) times daily.   Yes Historical Provider, MD  Multiple Vitamin (MULTIVITAMIN WITH  MINERALS) TABS Take 1 tablet by mouth every morning.    Yes Historical Provider, MD  nitroGLYCERIN (NITROSTAT) 0.4 MG SL tablet Place 0.4 mg under the tongue every 5 (five) minutes x 3 doses as needed for chest pain.    Yes Historical Provider, MD  nortriptyline (PAMELOR) 50 MG capsule Take 50 mg by mouth at bedtime.   Yes Historical Provider, MD  omeprazole (PRILOSEC) 20 MG capsule Take 20 mg by mouth 2 (two) times daily.   Yes Historical Provider, MD  ondansetron (ZOFRAN) 4 MG tablet Take 4 mg by mouth every 8 (eight) hours as  needed for nausea or vomiting.   Yes Historical Provider, MD  oxyCODONE-acetaminophen (PERCOCET/ROXICET) 5-325 MG per tablet Take 1-2 tablets by mouth every 4 (four) hours as needed for severe pain.    Yes Historical Provider, MD  potassium chloride SA (K-DUR,KLOR-CON) 20 MEQ tablet Take 20 mEq by mouth 2 (two) times daily.   Yes Historical Provider, MD  simvastatin (ZOCOR) 20 MG tablet Take 20 mg by mouth at bedtime.   Yes Historical Provider, MD  tiZANidine (ZANAFLEX) 4 MG tablet Take 4 mg by mouth 3 (three) times daily.  08/26/13  Yes Historical Provider, MD  warfarin (COUMADIN) 5 MG tablet Take 2.5-5 mg by mouth every morning. She takes half a tablet on Monday, Wednesday, Friday and one tablet on Tuesday, Thursday, Saturday and Sunday.   Yes Historical Provider, MD   No Known Allergies  FAMILY HISTORY:  Family History  Problem Relation Age of Onset  . Heart disease Mother   . Lung disease Father   . Kidney disease     SOCIAL HISTORY:  reports that she quit smoking about 29 years ago. Her smoking use included Cigarettes. She has a 22 pack-year smoking history. She has never used smokeless tobacco. She reports that she does not drink alcohol or use illicit drugs.  REVIEW OF SYSTEMS: n/a  SUBJECTIVE:  Pt obtunded, no specific complaints   VITAL SIGNS: Temp:  [98 F (36.7 C)-99.4 F (37.4 C)] 98 F (36.7 C) (05/03 1108) Pulse Rate:  [86-105] 86 (05/03  1400) Resp:  [18-22] 18 (05/03 1400) BP: (111-135)/(50-93) 114/84 mmHg (05/03 1400) SpO2:  [80 %-100 %] 98 % (05/03 1400) Weight:  [200 lb (90.719 kg)] 200 lb (90.719 kg) (05/03 1000) HEMODYNAMICS:   VENTILATOR SETTINGS:   INTAKE / OUTPUT: Intake/Output     05 /02 0701 - 05/03 0700 05/03 0701 - 05/04 0700   P.O.  60   I.V. (mL/kg)  425 (4.7)   Total Intake(mL/kg)  485 (5.3)   Urine (mL/kg/hr)  600   Total Output   600   Net   -115          PHYSICAL EXAMINATION: General:   Obtunded chronically ill pale wf poor air movement  No jvd Oropharanx clear/ dentures in place Neck supple Lungs with a few scattered exp > insp rhonchi bilaterally IRIR  no s3 or or sign murmur Abd obese with excursion limited Extr wam with no edema or clubbing noted Neuro  No motor deficits noted prior to obtundation    LABS:  CBC  Recent Labs Lab 11/14/13 0235 11/15/13 0510 11/16/13 0446 11/17/13 1321 12/13/2013 0735  WBC 4.5 4.5  --   --  8.8  HGB 8.2* 7.9* 7.7* 9.0* 9.1*  HCT 26.0* 25.4* 24.1* 28.6* 28.4*  PLT 45* 42*  --   --  97*   Coag's  Recent Labs Lab 11/16/13 0446 11/17/13 0448 12/05/2013 0735  INR 2.55* 2.85* 3.78*   BMET  Recent Labs Lab 11/14/13 0235 11/15/13 0510 11/27/2013 0735  NA 136* 140 140  K 4.5 4.4 4.4  CL 101 106 102  CO2 21 23 21   BUN 12 13 11   CREATININE 1.14* 1.13* 1.17*  GLUCOSE 90 94 110*   Electrolytes  Recent Labs Lab 11/14/13 0235 11/14/13 0420 11/15/13 0510 12/10/2013 0735  CALCIUM 7.9*  --  8.5 8.6  MG  --  1.6  --   --    Sepsis Markers  Recent Labs Lab 11/14/13 0420 12/08/2013 0814  LATICACIDVEN 1.1 2.07  ABG  Recent Labs Lab 11/14/13 0302  PHART 7.429  PCO2ART 35.5  PO2ART 75.4*   Liver Enzymes  Recent Labs Lab 11/20/2013 0735  AST 38*  ALT 14  ALKPHOS 122*  BILITOT 1.1  ALBUMIN 2.9*   Cardiac Enzymes No results found for this basename: TROPONINI, PROBNP,  in the last 168 hours Glucose No results found for this  basename: GLUCAP,  in the last 168 hours  Imaging Dg Chest Port 1 View  11/30/2013   CLINICAL DATA:  Shortness of breath.  EXAM: PORTABLE CHEST - 1 VIEW  COMPARISON:  11/16/2013  FINDINGS: Previous median sternotomy and CABG procedure. There is moderate cardiac enlargement. Persistent patchy airspace opacities within the left lung are identified. There is mild superimposed pulmonary edema.  IMPRESSION: 1. Cardiac enlargement and mild edema suggest CHF. 2. Persistent left lung opacities compatible with multifocal infection.   Electronically Signed   By: Kerby Moors M.D.   On: 12/07/2013 08:27        ASSESSMENT / PLAN:  PULMONARY A  Acute hypoxemic resp failure ? Etiology assoc with pulmonary infiltrates P:   Agree with CT chest once sort out ams  CARDIOVASCULAR A: chronic afib with slowed ventricular resp on BB P:  D/c lopressor Try Narcan/ fluids first   RENAL A: oliguric on admit P. Vol challenge   GASTROINTESTINAL A: No issues identified but hx incomplete   HEMATOLOGIC A: DS and plasma cell dyscrasia  P:  Hold further rx for now  INFECTIOUS A:  Possible sepsis P:   Agree with z/v per dashboard Will do prolactin protocol  ENDOCRINE A:  No issues   P:   Check cortisol   NEUROLOGIC A:  AMS c/w TME P:   Hold further narcotics CT head to r/o bleed    The patient is critically ill with multiple organ systems failure and requires high complexity decision making for assessment and support, frequent evaluation and titration of therapies, application of advanced monitoring technologies and extensive interpretation of multiple databases. Critical Care Time devoted to patient care services described in this note is 45 minutes.     Christinia Gully, MD Pulmonary and [place] [phone] After 5:30 PM or weekends, call [phone]

## 2013-11-19 NOTE — ED Notes (Signed)
Pt's husband states that pt got out of hospital on Friday from pna.  Pt was disoriented while she was in the hospital, husband was told that this would get better once she got home.  She has continued to be altered, fighting her husband, talking out of her head, and trying to get out of bed in the middle of the night.  Pt is A&O x 4 now.

## 2013-11-19 NOTE — Progress Notes (Signed)
ANTICOAGULATION CONSULT NOTE - Initial Consult  Pharmacy Consult for Coumadin Indication: atrial fibrillation  No Known Allergies  Patient Measurements: Height: 5' 7.5" (171.5 cm) Weight: 200 lb (90.719 kg) IBW/kg (Calculated) : 62.75  Vital Signs: Temp: 98 F (36.7 C) (05/03 1108) Temp src: Oral (05/03 1108) BP: 123/51 mmHg (05/03 1108) Pulse Rate: 95 (05/03 1108)  Labs:  Recent Labs  11/17/13 0448 11/17/13 1321 11/28/2013 0735  HGB  --  9.0* 9.1*  HCT  --  28.6* 28.4*  PLT  --   --  97*  LABPROT 28.9*  --  35.9*  INR 2.85*  --  3.78*  CREATININE  --   --  1.17*    Estimated Creatinine Clearance: 50.8 ml/min (by C-G formula based on Cr of 1.17).   Medical History: Past Medical History  Diagnosis Date  . Coronary atherosclerosis of unspecified type of vessel, native or graft   . Atrial fibrillation 01/19/2012    stress test - non-gated secondary to A fib,  normal myocaridal perfusion study  . Unspecified venous (peripheral) insufficiency   . Other and unspecified hyperlipidemia   . Morbid obesity   . Esophageal reflux   . Irritable bowel syndrome   . Other symptoms involving urinary system(788.99)   . Chronic pain syndrome   . 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 Anxiety state, unspecified   . Herpes zoster without mention of complication   . Herpes zoster with other nervous system complications(053.19)   . Lumbago   . Anticoagulated on Coumadin, chronic for A. Fib 04/08/2012  . COPD (chronic obstructive pulmonary disease)   . 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 Pneumonia 4 years ago  . Bronchitis, chronic   . Fibromyalgia   . Arthritis   . CAD (coronary artery disease) 04/29/2006    echo -EF 45-50%, impaired LV relaxation  . 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 GI bleed 04/07/2012    endoscopy/colonoscopy unremarkable  . 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 DJD  (degenerative joint disease)   . Myocardial infarction   . Anemia     Medications:  Prescriptions prior to admission  Medication Sig Dispense Refill  . albuterol (PROVENTIL) (2.5 MG/3ML) 0.083% nebulizer solution Take 2.5 mg by nebulization every 6 (six) hours as needed for wheezing or shortness of breath.      . ALPRAZolam (XANAX) 0.5 MG tablet Take 0.25-0.5 mg by mouth 4 (four) times daily as needed for anxiety.      Marland Kitchen amoxicillin-clavulanate (AUGMENTIN) 875-125 MG per tablet Take 1 tablet by mouth 2 (two) times daily. 4 TABLETS DISPENSED, FOR A TWO DAY COURSE , STARTED ON 11-18-13 RECEIVED FULL DOSE ON DAY 1, TWO TABLETS REMAIN      . aspirin EC 81 MG tablet Take 81 mg by mouth daily after supper.       . Calcium Carbonate-Vitamin D (CALCIUM-VITAMIN D) 500-200 MG-UNIT per tablet Take 1 tablet by mouth 2 (two) times daily with a meal.      . docusate sodium (COLACE) 100 MG capsule Take 200 mg by mouth daily.       . ferrous sulfate 325 (65 FE) MG tablet Take 325 mg by mouth every evening.      . Fluticasone-Salmeterol (ADVAIR) 250-50 MCG/DOSE AEPB Inhale 1 puff into the lungs 2 (two) times daily.      . furosemide (LASIX) 40 MG tablet Take 40  mg by mouth 2 (two) times daily.       Marland Kitchen gabapentin (NEURONTIN) 600 MG tablet Take 600 mg by mouth 3 (three) times daily.      . isosorbide mononitrate (IMDUR) 30 MG 24 hr tablet Take 30 mg by mouth at bedtime.      Marland Kitchen lenalidomide (REVLIMID) 10 MG capsule Take 10 mg by mouth daily. TAKE 1 CAPSULE FOR 21 DAYS , EVERY 4 WEEKS      . metoprolol (LOPRESSOR) 50 MG tablet Take 50 mg by mouth 2 (two) times daily.      . Multiple Vitamin (MULTIVITAMIN WITH MINERALS) TABS Take 1 tablet by mouth every morning.       . nitroGLYCERIN (NITROSTAT) 0.4 MG SL tablet Place 0.4 mg under the tongue every 5 (five) minutes x 3 doses as needed for chest pain.       . nortriptyline (PAMELOR) 50 MG capsule Take 50 mg by mouth at bedtime.      Marland Kitchen omeprazole (PRILOSEC) 20 MG  capsule Take 20 mg by mouth 2 (two) times daily.      . ondansetron (ZOFRAN) 4 MG tablet Take 4 mg by mouth every 8 (eight) hours as needed for nausea or vomiting.      Marland Kitchen oxyCODONE-acetaminophen (PERCOCET/ROXICET) 5-325 MG per tablet Take 1-2 tablets by mouth every 4 (four) hours as needed for severe pain.       . potassium chloride SA (K-DUR,KLOR-CON) 20 MEQ tablet Take 20 mEq by mouth 2 (two) times daily.      . simvastatin (ZOCOR) 20 MG tablet Take 20 mg by mouth at bedtime.      Marland Kitchen tiZANidine (ZANAFLEX) 4 MG tablet Take 4 mg by mouth 3 (three) times daily.       Marland Kitchen warfarin (COUMADIN) 5 MG tablet Take 2.5-5 mg by mouth every morning. She takes half a tablet on Monday, Wednesday, Friday and one tablet on Tuesday, Thursday, Saturday and Sunday.        Assessment: 74yo F admitted with AMS, possible sepsis, recent HAP. Pharmacy is asked to dose Coumadin for A.fib. She has not taken her dose for today.  INR supratherapeutic at 3.78.  Platelets are low at 97. No bleeding reported/documented. Also on ASA 81mg . Broad-spectrum abx will increase sensitivity to Coumadin.  Goal of Therapy:  INR 2-3 Monitor platelets by anticoagulation protocol: Yes   Plan:   Hold Coumadin today.  Check PT/INR daily.  Check CBC daily for now given elevated INR and low platelets.  Romeo Rabon, PharmD, pager [phone]. 12-14-13,11:31 AM.

## 2013-11-19 NOTE — Progress Notes (Signed)
eLink Physician-Brief Progress Note Patient Name: [name] DOB: Mar 05, 1941 MRN: [id number]  Date of Service  11/21/2013   HPI/Events of Note  AF-RVR on dopmaine Head Ct neg Chest CT c/w pna + retained secretions  eICU Interventions  Change to neo gtt CVL once INR reversed   Intervention Category Major Interventions: Arrhythmia - evaluation and management;Hypotension - evaluation and management  Rigoberto Noel 11/17/2013, 8:17 PM

## 2013-11-19 NOTE — ED Provider Notes (Signed)
CSN: [id number]     Arrival date & time 12/17/2013  0717 History   First MD Initiated Contact with Patient  [phone]     Chief Complaint  Patient presents with  . Altered Mental Status     (Consider location/radiation/quality/duration/timing/severity/associated sxs/prior Treatment) HPI Comments: Patient brought to the ER for evaluation of confusion. Information provided by the patient's husband. Patient was recently hospitalized for pneumonia, discharged 2 days ago. She was sent home on Augmentin. Husband reports that at time of discharge she seemed a little bit confused, but it was felt that it might be secondary to the hospitalization and that she might do better at home. He reports, however, since going home she has progressively become more weak, more confused. She also has had some difficulty breathing. This seems to improve with her albuterol, but has not completely resolved.  Patient is a 73 y.o. female presenting with altered mental status.  Altered Mental Status Presenting symptoms: confusion   Associated symptoms: weakness     Past Medical History  Diagnosis Date  . Coronary atherosclerosis of unspecified type of vessel, native or graft   . Atrial fibrillation 01/19/2012    stress test - non-gated secondary to A fib,  normal myocaridal perfusion study  . Unspecified venous (peripheral) insufficiency   . Other and unspecified hyperlipidemia   . Morbid obesity   . Esophageal reflux   . Irritable bowel syndrome   . Other symptoms involving urinary system(788.99)   . Chronic pain syndrome   . 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 Anxiety state, unspecified   . Herpes zoster without mention of complication   . Herpes zoster with other nervous system complications(053.19)   . Lumbago   . Anticoagulated on Coumadin, chronic for A. Fib 04/08/2012  . COPD (chronic obstructive pulmonary disease)   . 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  Pneumonia 4 years ago  . Bronchitis, chronic   . Fibromyalgia   . Arthritis   . CAD (coronary artery disease) 04/29/2006    echo -EF 45-50%, impaired LV relaxation  . 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 GI bleed 04/07/2012    endoscopy/colonoscopy unremarkable  . 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 DJD (degenerative joint disease)   . Myocardial infarction   . Anemia    Past Surgical History  Procedure Laterality Date  . Vesicovaginal fistula closure w/ tah    . Cardiac catheterization  01/06/2010    RCA mid artery stenosis at 99%, LAD proximal occlusion 90%  at origin of ramus  . Nissen fundoplication    . Esophagogastroduodenoscopy  04/09/2012    Procedure: ESOPHAGOGASTRODUODENOSCOPY (EGD);  Surgeon: Beryle Beams, MD;  Location: [facility] ENDOSCOPY;  Service: Endoscopy;  Laterality: N/A;  tentatively scheduled per Trula Slade due to patients breathing problems  . Colonoscopy  04/11/2012    Procedure: COLONOSCOPY;  Surgeon: Ladene Artist, MD,FACG;  Location: [facility] ENDOSCOPY;  Service: Endoscopy;  Laterality: N/A;  . Abdominal hysterectomy  at 73 years old  . Appendectomy  about 50 years ago  . Coronary artery bypass graft  1997    LIMA to LAD, SVG to ramus, SVG to RCA  . Cholecystectomy  50 years ago  . Neck surgery  73 years old    full movement  . Right knee arthroscopy  3 years ago  . Eye surgery  2005    cataracts both eyes  .  Back surgery      x5, "birdcage" and fused in lower back  . Total knee arthroplasty  06/28/2012    Procedure: TOTAL KNEE ARTHROPLASTY;  Surgeon: Sydnee Cabal, MD;  Location: [facility] ORS;  Service: Orthopedics;  Laterality: Left;  . Joint replacement Left 06/28/12    knee replacement   Family History  Problem Relation Age of Onset  . Heart disease Mother   . Lung disease Father   . Kidney disease     History  Substance Use Topics  . Smoking status: Former Smoker -- 1.00 packs/day for 22 years    Types:  Cigarettes    Quit date: 07/20/1984  . Smokeless tobacco: Never Used  . Alcohol Use: No   OB History   Grav Para Term Preterm Abortions TAB SAB Ect Mult Living                 Review of Systems  Respiratory: Positive for cough and shortness of breath.   Neurological: Positive for weakness.  Psychiatric/Behavioral: Positive for confusion.  All other systems reviewed and are negative.     Allergies  Review of patient's allergies indicates no known allergies.  Home Medications   Prior to Admission medications   Medication Sig Start Date End Date Taking? Authorizing Provider  albuterol (PROVENTIL) (2.5 MG/3ML) 0.083% nebulizer solution Take 3 mLs (2.5 mg total) by nebulization 4 (four) times daily as needed for wheezing or shortness of breath. 09/21/13   Noralee Space, MD  ALPRAZolam Duanne Moron) 0.5 MG tablet Take 0.25-0.5 mg by mouth 4 (four) times daily as needed for anxiety.    Historical Provider, MD  amoxicillin-clavulanate (AUGMENTIN) 875-125 MG per tablet Take 1 tablet by mouth 2 (two) times daily. 11/17/13   Hosie Poisson, MD  aspirin EC 81 MG tablet Take 81 mg by mouth daily after supper.     Historical Provider, MD  benzonatate (TESSALON) 100 MG capsule Take 1 capsule (100 mg total) by mouth 3 (three) times daily as needed for cough. 10/26/13   Domenic Polite, MD  Calcium Carbonate-Vitamin D (CALCIUM-VITAMIN D) 500-200 MG-UNIT per tablet Take 1 tablet by mouth 2 (two) times daily with a meal.    Historical Provider, MD  docusate sodium (COLACE) 100 MG capsule Take 200 mg by mouth daily as needed for mild constipation.    Historical Provider, MD  ferrous sulfate 325 (65 FE) MG tablet Take 325 mg by mouth every evening.    Historical Provider, MD  Fluticasone-Salmeterol (ADVAIR DISKUS) 250-50 MCG/DOSE AEPB Inhale 1 puff into the lungs 2 (two) times daily. 09/21/13   Noralee Space, MD  furosemide (LASIX) 40 MG tablet Take 40 mg by mouth 2 (two) times daily.     Historical Provider, MD   gabapentin (NEURONTIN) 600 MG tablet Take 600 mg by mouth 3 (three) times daily. 10/26/13   Domenic Polite, MD  isosorbide mononitrate (IMDUR) 30 MG 24 hr tablet Take 1 tablet (30 mg total) by mouth at bedtime. 01/16/13   Lorretta Harp, MD  lenalidomide (REVLIMID) 10 MG capsule Take 1 capsule (10 mg total) by mouth daily. Take 1 capsule ( 10 mg) by mouth daily for 21 days every 4 weeks 11/03/13   Curt Bears, MD  metoprolol (LOPRESSOR) 50 MG tablet Take 1 tablet (50 mg total) by mouth 2 (two) times daily. 09/07/13   Verlee Monte, MD  Multiple Vitamin (MULTIVITAMIN WITH MINERALS) TABS Take 1 tablet by mouth every morning.     Historical Provider, MD  nitroGLYCERIN (NITROSTAT) 0.4 MG SL tablet Place 0.4 mg under the tongue every 5 (five) minutes as needed for chest pain.    Historical Provider, MD  nortriptyline (PAMELOR) 50 MG capsule Take 1 capsule (50 mg total) by mouth at bedtime. 09/21/13   Noralee Space, MD  omeprazole (PRILOSEC) 20 MG capsule Take 20 mg by mouth 2 (two) times daily.    Historical Provider, MD  ondansetron (ZOFRAN) 4 MG tablet Take 4 mg by mouth every 8 (eight) hours as needed for nausea or vomiting.    Historical Provider, MD  oxyCODONE-acetaminophen (PERCOCET/ROXICET) 5-325 MG per tablet Take 2 tablets by mouth every 4 (four) hours as needed for pain.    Historical Provider, MD  potassium chloride SA (K-DUR,KLOR-CON) 20 MEQ tablet Take 20 mEq by mouth 2 (two) times daily.    Historical Provider, MD  simvastatin (ZOCOR) 20 MG tablet Take 20 mg by mouth at bedtime.    Historical Provider, MD  tiZANidine (ZANAFLEX) 4 MG tablet Take 4 mg by mouth 3 (three) times daily.  08/26/13   Historical Provider, MD  warfarin (COUMADIN) 5 MG tablet Take 2.5-5 mg by mouth every morning. She takes half a tablet on Monday, Wednesday, Friday and one tablet on Tuesday, Thursday, Saturday and Sunday.    Historical Provider, MD   BP 114/84  Pulse 86  Temp(Src) 98 F (36.7 C) (Oral)  Resp 18  Ht  5' 7.5" (1.715 m)  Wt 200 lb (90.719 kg)  BMI 30.84 kg/m2  SpO2 98% Physical Exam  Constitutional: She appears well-developed and well-nourished. No distress.  HENT:  Head: Normocephalic and atraumatic.  Right Ear: Hearing normal.  Left Ear: Hearing normal.  Nose: Nose normal.  Mouth/Throat: Oropharynx is clear and moist and mucous membranes are normal.  Eyes: Conjunctivae and EOM are normal. Pupils are equal, round, and reactive to light.  Neck: Normal range of motion. Neck supple.  Cardiovascular: S1 normal and S2 normal.  An irregularly irregular rhythm present. Tachycardia present.  Exam reveals no gallop and no friction rub.   No murmur heard. Pulmonary/Chest: Effort normal. No respiratory distress. She has decreased breath sounds. She has wheezes. She has rhonchi. She exhibits no tenderness.  Abdominal: Soft. Normal appearance and bowel sounds are normal. There is no hepatosplenomegaly. There is no tenderness. There is no rebound, no guarding, no tenderness at McBurney's point and negative Murphy's sign. No hernia.  Musculoskeletal: Normal range of motion.  Neurological: She is alert. She has normal strength. No cranial nerve deficit or sensory deficit. Coordination normal. GCS eye subscore is 4. GCS verbal subscore is 4. GCS motor subscore is 6.  Skin: Skin is warm, dry and intact. No rash noted. No cyanosis.  Psychiatric: She has a normal mood and affect. Her speech is normal and behavior is normal. Thought content normal.    ED Course  Procedures (including critical care time) Labs Review Labs Reviewed  MRSA PCR SCREENING - Abnormal; Notable for the following:    MRSA by PCR POSITIVE (*)    All other components within normal limits  CBC WITH DIFFERENTIAL - Abnormal; Notable for the following:    RBC 3.09 (*)    Hemoglobin 9.1 (*)    HCT 28.4 (*)    RDW 23.5 (*)    Platelets 97 (*)    Neutrophils Relative % 18 (*)    Monocytes Relative 37 (*)    Basophils Relative 2 (*)     Neutro Abs 1.6 (*)  Monocytes Absolute 3.3 (*)    Basophils Absolute 0.2 (*)    All other components within normal limits  COMPREHENSIVE METABOLIC PANEL - Abnormal; Notable for the following:    Glucose, Bld 110 (*)    Creatinine, Ser 1.17 (*)    Albumin 2.9 (*)    AST 38 (*)    Alkaline Phosphatase 122 (*)    GFR calc non Af Amer 45 (*)    GFR calc Af Amer 53 (*)    All other components within normal limits  URINALYSIS, ROUTINE W REFLEX MICROSCOPIC - Abnormal; Notable for the following:    Color, Urine AMBER (*)    Bilirubin Urine SMALL (*)    Protein, ur 100 (*)    Urobilinogen, UA 2.0 (*)    All other components within normal limits  PROTIME-INR - Abnormal; Notable for the following:    Prothrombin Time 35.9 (*)    INR 3.78 (*)    All other components within normal limits  BLOOD GAS, VENOUS - Abnormal; Notable for the following:    pH, Ven 7.428 (*)    pCO2, Ven 33.3 (*)    All other components within normal limits  BLOOD GAS, ARTERIAL - Abnormal; Notable for the following:    Acid-base deficit 3.4 (*)    All other components within normal limits  GLUCOSE, CAPILLARY - Abnormal; Notable for the following:    Glucose-Capillary 108 (*)    All other components within normal limits  CULTURE, BLOOD (ROUTINE X 2)  CULTURE, BLOOD (ROUTINE X 2)  URINE CULTURE  CULTURE, EXPECTORATED SPUTUM-ASSESSMENT  URINE MICROSCOPIC-ADD ON  VITAMIN B12  TSH  T4, FREE  I-STAT CG4 LACTIC ACID, ED    Imaging Review Dg Chest Port 1 View  2013-11-21   CLINICAL DATA:  Shortness of breath.  EXAM: PORTABLE CHEST - 1 VIEW  COMPARISON:  11/16/2013  FINDINGS: Previous median sternotomy and CABG procedure. There is moderate cardiac enlargement. Persistent patchy airspace opacities within the left lung are identified. There is mild superimposed pulmonary edema.  IMPRESSION: 1. Cardiac enlargement and mild edema suggest CHF. 2. Persistent left lung opacities compatible with multifocal infection.    Electronically Signed   By: Kerby Moors M.D.   On: Nov 21, 2013 08:27     EKG Interpretation   Date/Time:  2013/11/21 07:33:08 EDT Ventricular Rate:  137 PR Interval:    QRS Duration: 98 QT Interval:  321 QTC Calculation: 485 R Axis:   89 Text Interpretation:  Atrial fibrillation Borderline right axis deviation  Low voltage, precordial leads Nonspecific repol abnormality, diffuse leads  Baseline wander in lead(s) V2 Confirmed by Candon Caras  MD, Hiawatha Dressel  ([id number]) on 11/21/2013 8:43:45 AM      MDM   Final diagnoses:  1. Healthcare associated pneumonia 2. Acute respiratory failure 3. COPD     Patient presents to the ER for evaluation of mental status changes and difficulty breathing. She was just hospitalized with healthcare associated pneumonia, discharged hospital 2 days ago. She has done poorly at home. Upon arrival she is tachycardic, tachypneic and severely hypoxic. Hypoxia did improve with supplemental oxygen. Chest x-ray shows persistent multifocal pneumonia.  She was initiated on sepsis pathway arrival. After IV fluids, her heart rate has improved. She is not hypotensive. She will require repeat hospitalization for further management of persistent and/or recurrent healthcare associated pneumonia.    Orpah Greek, MD 2013-11-21 [phone]

## 2013-11-20 DIAGNOSIS — A419 Sepsis, unspecified organism: Secondary | ICD-10-CM

## 2013-11-20 LAB — PROTIME-INR
INR: 1.67 — AB (ref 0.00–1.49)
PROTHROMBIN TIME: 19.2 s — AB (ref 11.6–15.2)

## 2013-11-20 LAB — PREPARE FRESH FROZEN PLASMA
UNIT DIVISION: 0
Unit division: 0

## 2013-11-20 LAB — CULTURE, BLOOD (ROUTINE X 2)
CULTURE: NO GROWTH
Culture: NO GROWTH

## 2013-11-20 LAB — PREPARE RBC (CROSSMATCH)

## 2013-11-20 LAB — CBC
HCT: 22.9 % — ABNORMAL LOW (ref 36.0–46.0)
HEMOGLOBIN: 7.1 g/dL — AB (ref 12.0–15.0)
MCH: 29 pg (ref 26.0–34.0)
MCHC: 31 g/dL (ref 30.0–36.0)
MCV: 93.5 fL (ref 78.0–100.0)
PLATELETS: 77 10*3/uL — AB (ref 150–400)
RBC: 2.45 MIL/uL — ABNORMAL LOW (ref 3.87–5.11)
RDW: 23.6 % — AB (ref 11.5–15.5)
WBC: 5.7 10*3/uL (ref 4.0–10.5)

## 2013-11-20 LAB — URINE CULTURE
COLONY COUNT: NO GROWTH
Culture: NO GROWTH

## 2013-11-20 LAB — PROCALCITONIN: PROCALCITONIN: 3.67 ng/mL

## 2013-11-20 LAB — T4, FREE: Free T4: 0.84 ng/dL (ref 0.80–1.80)

## 2013-11-20 LAB — VITAMIN B12: VITAMIN B 12: 1149 pg/mL — AB (ref 211–911)

## 2013-11-20 LAB — OCCULT BLOOD X 1 CARD TO LAB, STOOL: Fecal Occult Bld: NEGATIVE

## 2013-11-20 LAB — CORTISOL: CORTISOL PLASMA: 9.1 ug/dL

## 2013-11-20 MED ORDER — MUPIROCIN 2 % EX OINT
1.0000 "application " | TOPICAL_OINTMENT | Freq: Two times a day (BID) | CUTANEOUS | Status: DC
  Administered 2013-11-20 – 2013-11-22 (×5): 1 via NASAL
  Filled 2013-11-20 (×2): qty 22

## 2013-11-20 MED ORDER — HALOPERIDOL LACTATE 5 MG/ML IJ SOLN
5.0000 mg | Freq: Once | INTRAMUSCULAR | Status: AC
  Administered 2013-11-20: 5 mg via INTRAVENOUS

## 2013-11-20 MED ORDER — LORAZEPAM 2 MG/ML IJ SOLN
2.0000 mg | Freq: Once | INTRAMUSCULAR | Status: AC
  Administered 2013-11-20: 2 mg via INTRAVENOUS

## 2013-11-20 MED ORDER — LORAZEPAM 2 MG/ML IJ SOLN
1.0000 mg | Freq: Once | INTRAMUSCULAR | Status: AC
  Administered 2013-11-20: 1 mg via INTRAVENOUS
  Filled 2013-11-20: qty 1

## 2013-11-20 MED ORDER — VANCOMYCIN HCL IN DEXTROSE 750-5 MG/150ML-% IV SOLN
750.0000 mg | Freq: Two times a day (BID) | INTRAVENOUS | Status: DC
  Administered 2013-11-21 – 2013-11-22 (×4): 750 mg via INTRAVENOUS
  Filled 2013-11-20 (×5): qty 150

## 2013-11-20 MED ORDER — METOPROLOL TARTRATE 12.5 MG HALF TABLET
12.5000 mg | ORAL_TABLET | Freq: Two times a day (BID) | ORAL | Status: DC
  Administered 2013-11-20: 12.5 mg via ORAL
  Filled 2013-11-20 (×4): qty 1

## 2013-11-20 MED ORDER — DEXMEDETOMIDINE HCL IN NACL 400 MCG/100ML IV SOLN
0.4000 ug/kg/h | INTRAVENOUS | Status: DC
  Administered 2013-11-20: 1 ug/kg/h via INTRAVENOUS
  Administered 2013-11-21: 1.2 ug/kg/h via INTRAVENOUS
  Administered 2013-11-21: 0.7 ug/kg/h via INTRAVENOUS
  Administered 2013-11-21: 1.2 ug/kg/h via INTRAVENOUS
  Administered 2013-11-21 (×2): 0.8 ug/kg/h via INTRAVENOUS
  Administered 2013-11-22: 1.1 ug/kg/h via INTRAVENOUS
  Administered 2013-11-22 (×2): 1.2 ug/kg/h via INTRAVENOUS
  Filled 2013-11-20 (×9): qty 100

## 2013-11-20 MED ORDER — CHLORHEXIDINE GLUCONATE CLOTH 2 % EX PADS
6.0000 | MEDICATED_PAD | Freq: Every day | CUTANEOUS | Status: DC
  Administered 2013-11-22: 6 via TOPICAL

## 2013-11-20 MED ORDER — METOPROLOL TARTRATE 1 MG/ML IV SOLN
5.0000 mg | Freq: Once | INTRAVENOUS | Status: AC
  Administered 2013-11-20: 5 mg via INTRAVENOUS
  Filled 2013-11-20: qty 5

## 2013-11-20 MED ORDER — LORAZEPAM 2 MG/ML IJ SOLN
1.0000 mg | Freq: Once | INTRAMUSCULAR | Status: AC
  Administered 2013-11-20: 1 mg via INTRAVENOUS

## 2013-11-20 MED ORDER — DEXMEDETOMIDINE HCL IN NACL 200 MCG/50ML IV SOLN
0.4000 ug/kg/h | INTRAVENOUS | Status: DC
  Administered 2013-11-20: 1 ug/kg/h via INTRAVENOUS
  Administered 2013-11-20: 0.4 ug/kg/h via INTRAVENOUS
  Administered 2013-11-20: 1 ug/kg/h via INTRAVENOUS
  Filled 2013-11-20 (×4): qty 50

## 2013-11-20 MED ORDER — FUROSEMIDE 10 MG/ML IJ SOLN
40.0000 mg | Freq: Once | INTRAMUSCULAR | Status: AC
  Administered 2013-11-20: 40 mg via INTRAVENOUS
  Filled 2013-11-20: qty 4

## 2013-11-20 MED ORDER — HALOPERIDOL LACTATE 5 MG/ML IJ SOLN
4.0000 mg | Freq: Once | INTRAMUSCULAR | Status: AC
  Administered 2013-11-20: 4 mg via INTRAVENOUS

## 2013-11-20 NOTE — Progress Notes (Signed)
eLink Physician-Brief Progress Note Patient Name: [name] DOB: 1941-04-02 MRN: [id number]  Date of Service  11/20/2013   HPI/Events of Note  Pt acutely agitated.   eICU Interventions  Will ask dr byrum to see again precedex ordered    Intervention Category Major Interventions: Change in mental status - evaluation and management;Delirium, psychosis, severe agitation - evaluation and management  Elsie Stain 11/20/2013, 5:13 PM

## 2013-11-20 NOTE — Progress Notes (Signed)
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 Pna/tme and pulm/ccm initially asked to see for ? Why recurrent pna but on arrival noted pt obtunded (around 2pm p foley place) and converted to CCM consult.   SIGNIFICANT EVENTS / STUDIES:  CT chest  5/3 >>>extensive bilateral multifocal PNA, UL predominant GGO, focal masslike consolidation in LLL, R anterior/medial ML, mediastinal / hilar adenopathy, small bilateral pleural effusions, secretions filling the distal trachea, carina extending into the left mainstem bronchus CT Head 5/3 >>>no acute abnormality Procalcitonin protocol 5/3 >>4.26 -->3.67 Cortisol level 5/3 >>9.1  LINES / TUBES:    CULTURES: BC x 2 5/3 >>> MRSA Screen 5/3 > POS  UC 5/3 >>>   ANTIBIOTICS: Zosyn 5/3 >>> Vanc IV 5/3 >>>   SUBJECTIVE:  Pt obtunded, no specific complaints   VITAL SIGNS: Temp:  [97.5 F (36.4 C)-102.9 F (39.4 C)] 98.4 F (36.9 C) (05/04 0800) Pulse Rate:  [27-156] 104 (05/04 0900) Resp:  [18-31] 25 (05/04 0900) BP: (83-155)/(28-101) 131/63 mmHg (05/04 0900) SpO2:  [60 %-100 %] 99 % (05/04 0900) Weight:  [217 lb 2.5 oz (98.5 kg)] 217 lb 2.5 oz (98.5 kg) (05/04 0400)  HEMODYNAMICS:    VENTILATOR SETTINGS:    INTAKE / OUTPUT: Intake/Output     05/03 0701 - 05/04 0700 05/04 0701 - 05/05 0700   P.O. 60    I.V. (mL/kg) 3859.4 (39.2) 170 (1.7)   Blood 550    Other 593    IV Piggyback 150 250   Total Intake(mL/kg) 5212.4 (52.9) 420 (4.3)   Urine (mL/kg/hr)  1015 80 (0.3)   Total Output 1015 80   Net +4197.4 +340          PHYSICAL EXAMINATION: General:   Obtunded chronically ill pale wf poor air movement  No jvd Oropharanx clear/ dentures in place Neck supple Lungs with a few scattered exp > insp rhonchi bilaterally IRIR  no s3 or or sign murmur Abd obese with excursion limited Extr wam with no edema or clubbing noted Neuro  No motor deficits noted prior to obtundation    LABS:  CBC  Recent Labs Lab 11/15/13 0510  11/17/13 1321 12/12/2013 0735 11/20/13 0335  WBC 4.5  --   --  8.8 5.7  HGB 7.9*  < > 9.0* 9.1* 7.1*  HCT 25.4*  < > 28.6* 28.4* 22.9*  PLT 42*  --   --  97* 77*  < > = values in this interval not displayed. Coag's  Recent Labs Lab 11/17/13 0448 12/02/2013 0735 11/20/13 0335  INR 2.85* 3.78* 1.67*   BMET  Recent Labs Lab 11/14/13 0235 11/15/13 0510 12/13/2013 0735  NA 136* 140 140  K 4.5 4.4 4.4  CL 101 106 102  CO2 21 23 21   BUN 12 13 11   CREATININE 1.14* 1.13* 1.17*  GLUCOSE 90 94 110*   Electrolytes  Recent Labs Lab 11/14/13 0235 11/14/13 0420 11/15/13 0510 12/12/2013 0735  CALCIUM 7.9*  --  8.5 8.6  MG  --  1.6  --   --    Sepsis Markers  Recent Labs Lab 11/14/13 0420 25-Nov-2013 0814 11/25/2013 1545 11/20/13 0335  LATICACIDVEN 1.1 2.07  --   --   PROCALCITON  --   --  4.26 3.67   ABG  Recent Labs Lab 11/14/13 0302 2013/11/25 1500  PHART 7.429 7.384  PCO2ART 35.5 35.4  PO2ART 75.4* 92.2   Liver Enzymes  Recent Labs Lab 11/25/2013 0735  AST 38*  ALT 14  ALKPHOS 122*  BILITOT 1.1  ALBUMIN 2.9*   Cardiac Enzymes No results found for this basename: TROPONINI, PROBNP,  in the last 168 hours Glucose  Recent Labs Lab Nov 25, 2013 1509  GLUCAP 108*    Imaging Ct Head Wo Contrast  11-25-2013   CLINICAL DATA:  Altered mental status  EXAM: CT HEAD WITHOUT CONTRAST  TECHNIQUE: Contiguous axial images were obtained from the base of the skull through the vertex without intravenous  contrast.  COMPARISON:  11/12/2011  FINDINGS: Ventricles are normal in configuration there is ventricular and sulcal enlargement reflecting atrophy. No hydrocephalus.  No parenchymal masses or mass effect there is no evidence of a cortical infarct.  There are no extra-axial masses or abnormal fluid collections.  No intracranial hemorrhage.  Visualized sinuses and mastoid air cells are clear.  IMPRESSION: 1. No acute intracranial abnormalities.  No intracranial hemorrhage. 2. Stable atrophy.   Electronically Signed   By: Lajean Manes M.D.   On: 25-Nov-2013 18:05   Ct Chest Wo Contrast  Nov 25, 2013   ADDENDUM REPORT: Nov 25, 2013 18:00  ADDENDUM: Not mentioned in the original report. There are secretions partly filling the distal trachea at the carina extending into the left mainstem bronchus.   Electronically Signed   By: Lajean Manes M.D.   On: 11/25/13 18:00   11-25-2013   CLINICAL DATA:  Hypoxia.  Evaluate for recurrent pneumonia.  EXAM: CT CHEST WITHOUT CONTRAST  TECHNIQUE: Multidetector CT imaging of the chest was performed following the standard protocol without IV contrast.  COMPARISON:  Current chest radiograph.  Chest CT, 08/31/2013.  FINDINGS: There is patchy ground-glass opacity that predominates in the upper lobes. There is focal, masslike, confluent opacity in several locations, most prominently in the left lower lobe and in the anteromedial right middle lobe at the lung base. There are minimal right and small left pleural effusions. Underlying emphysema is noted in the upper lobes.  Heart is mildly enlarged. There are dense coronary artery calcifications  Mediastinal adenopathy is noted. Reference measurements were made of several nodes There is a precarinal lymph node measuring 16 mm in short axis. There is a prevascular node measuring 16 mm short axis. A number prevascular node adjacent to the AP window measures 11 mm in short axis. A subcarinal node measures 15 mm short axis. Bile hilar adenopathy  is noted not well-defined due to lack of contrast. Adenopathy has mildly increased when compared to the prior study.  Limited evaluation of the upper abdomen is unremarkable.  No osteoblastic or osteolytic lesions. There is a mild compression fracture of the upper endplate of T4. This is stable.  IMPRESSION: 1. Findings are consistent with extensive, bilateral, multifocal pneumonia. There are patchy areas of upper lobe predominant ground-glass opacity. There also focal masslike areas of consolidation predominating in the left lower lobe and right anterior medial middle lobe. There is associated mediastinal and hilar adenopathy. Minimal right and small left effusions.  Electronically Signed: By: Dedra Skeens.D.  On: 12/05/2013 17:41   Dg Chest Port 1 View  12/04/2013   CLINICAL DATA:  Shortness of breath.  EXAM: PORTABLE CHEST - 1 VIEW  COMPARISON:  11/16/2013  FINDINGS: Previous median sternotomy and CABG procedure. There is moderate cardiac enlargement. Persistent patchy airspace opacities within the left lung are identified. There is mild superimposed pulmonary edema.  IMPRESSION: 1. Cardiac enlargement and mild edema suggest CHF. 2. Persistent left lung opacities compatible with multifocal infection.   Electronically Signed   By: Kerby Moors M.D.   On: 12/17/2013 08:27        ASSESSMENT / PLAN:  PULMONARY A   Acute hypoxemic resp failure  Bilateral Infiltrates - multifocal PNA, +/- component edema  HCAP - recent admit with CAP, readmit with AMS + previous CAP COPD P:   Continue advair  Pulmonary hygiene:  Chest PT & flutter Lasix x 1 dose post PRBC's tx Complete Rx 7-10 days total abx, can likely transition to oral dosing soon    CARDIOVASCULAR A:  Hypotension - in setting of bilateral infiltrates +/- narcotic influence.  Resolved.  Chronic AFib - on anticoagulation with slowed ventricular resp on BB CAD PVD P:   Continue to hold oral imdur, zocor Resume lopressor at reduced  dose  PRN lopressor with parameters Reduce IVF to 40 ml /hr  RENAL A:  Oliguric - responded to volume.  Resolved.  P.  -monitor UOP  GASTROINTESTINAL A:  Morbid Obesity Hx of GIB - remote, husband reports dark stool P: -allow ice chips  HEMATOLOGIC A:  Anemia - noted drop, reports of dark stool on anti-coagulation + ASA DS and plasma cell dyscrasia  Chronic Anti-Coagulation  P:  Continue Iron Hold Coumadin SCD's  Check FOB   INFECTIOUS A:   Possible sepsis / HCAP P:   Agree with z/v per dashboard PCT protocol  ENDOCRINE A:   No issues   P:   Cortisol wnl  NEUROLOGIC A:   AMS c/w TME - resolved 5/4  Chronic Pain Syndrome Anxiety  Fibromyalgia P:   Hold neurontin, nortiptyline, percocet, zanaflex, xanax  Cautious restart of above, consider resuming one at a time.  Or not at all.  CT head neg   PCCM will be available PRN.  Please call if we can be of further assistance.   Noe Gens, NP-C [facility] Pgr: [phone] or [phone]  Baltazar Apo, MD, PhD 11/20/2013, 11:21 AM [facility] [phone] or if no answer [phone]

## 2013-11-20 NOTE — Progress Notes (Signed)
PCCM Interval Note  Ms Keil began to experience an agitated delirium this afternoon. Her family indicates that this is exactly what she has been doing at home and even during her previous hospitalization for pneumonia. She was brought back to [facility] for this reason, received haldol and ativan last night. She was lucid on my exam this am, but now has significantly changed. Haldol and ativan have been given without any improvement. To the best that I can tell she is not in any discomfort. She has associated tachycardia (A Fib) and hypertension.  Filed Vitals:   11/20/13 1440 11/20/13 1500 11/20/13 1600 11/20/13 1725  BP: 122/75 145/78 116/95 122/101  Pulse:   164   Temp: 98.8 F (37.1 C)     TempSrc: Axillary     Resp: 27 28 31 26   Height:      Weight:      SpO2: 100% 100% 97%    PHYSICAL EXAMINATION:  General: Chronically ill pale wf, very agitated No jvd  Oropharanx clear/ dentures removed Neck supple  Lungs with a few scattered exp > insp rhonchi bilaterally  IRIR no s3 or or sign murmur, tachy > HR 170's Abd obese with excursion limited  Extr wam with no edema or clubbing noted  Neuro agitated, trying to get up, no weakness, able to move all ext. Not following commands, slurred spech  Impression:  Acute agitated delirium, actually appears to be a cyclical process. Initially ascribed to ICU / critical illness delirium but DDx is broad > consider meds, withdrawal from meds, dementia, infection, etc. Her CT scan head was reassuring 5/3.   Plans:  In short term need to control her acute agitation, then we can focus on why this has been happening recurrently. She has good PIV access and has been ordered precedex. Will titrate to effect. Once calm, if her HR remains elevated will give rate control meds.  She may require a neuro consultation. ? A psych consultation at some point to help titrate meds and avoid side effects. She is not a candidate for ETT / MV > DNR status has been well  established.   30 minutes CC time   Baltazar Apo, MD, PhD 11/20/2013, 6:10 PM [facility] [phone] or if no answer [phone]

## 2013-11-20 NOTE — Progress Notes (Signed)
CARE MANAGEMENT NOTE 11/20/2013  Patient:  [name], [name]   Account Number:  [id number]  Date Initiated:  11/20/2013  Documentation initiated by:  DAVIS,RHONDA  Subjective/Objective Assessment:   pt recently dcd from [facility] on [id number] after tx'd for pna, became confused and obtunded daughter brought her to er [id number] ct scan shows multiple focal areas of pna     Action/Plan:   home when stable   Anticipated DC Date:  Dec 09, 2013   Anticipated DC Plan:  HOME/SELF CARE  In-house referral  NA      DC Planning Services  NA      [facility] Choice  NA   Choice offered to / List presented to:  NA   DME arranged  NA      DME agency  NA     [facility] arranged  NA      [facility] agency  NA   Status of service:  In process, will continue to follow Medicare Important Message given?  NA - LOS <3 / Initial given by admissions (If response is "NO", the following Medicare IM given date fields will be blank) Date Medicare IM given:   Date Additional Medicare IM given:    Discharge Disposition:    Per UR Regulation:  Reviewed for med. necessity/level of care/duration of stay  If discussed at [place] of Stay Meetings, dates discussed:    Comments:  [id number]/Rhonda Eldridge Dace, BSN, [place] [phone] Chart Reviewed for discharge and hospital needs. Discharge needs at time of review: None present will follow for needs. Review of patient progress due on [id number].

## 2013-11-20 NOTE — Progress Notes (Signed)
Pt has been severely agitated this evening.  BP has been labile.  Neosynephrine has been off since 2330.  Pt has been very agitated,disoriented and febrile.  Nursing staff and husband have consistently been at bedside while pt attempting to get OOB and pull at lines. HR has been afib 140-160s.  Dr. Elsworth Soho is aware and orders have been addressed.  FFP has infused, but there are no plans for central line because the pressors have been weaned off.  Will continue to monitor.

## 2013-11-20 NOTE — Progress Notes (Addendum)
Called to see patient by RN due to increased aggitation.  Pt HR also increased into 170-180 when aggitated.  Pt has known chronic afib. When pt is calm, HR drops to 140-150.  Pt is confused and aggitated.  Pt has similar episode last night requiring 9mg  Haldol and 1 mg Ativan.  Presently, ativan 5mg  total have been given.  Haldol 14 mg total IV given.  Patient remains aggitated pulling on IVs and trying to get out of bed and interfering with medical devices.  Contacted CCM and spoke with Dr. Joya Gaskins via [facility].  He will contact Dr. Lamonte Sakai to evaluate patient.    Precedex orders have been placed.  CV-IRRR, tachy Lung- bilateral scattered rales, no wheeze Abd--soft +BS/ ND/NT Ext--trace edema, no lymphangitis.

## 2013-11-20 NOTE — Progress Notes (Signed)
PROGRESS NOTE  [name] GMW:[id number] DOB: 10-31-1940 DOA: 11/27/2013 PCP: Noralee Space, MD  Interim history 73 year old female with a history of myelodysplastic syndrome, plasma cell dyscrasia, COPD, chronic diastolic CHF, CKD stage III, chronic atrial fibrillation on anticoagulation, and hyperlipidemia presents with increasing confusion that began on the evening of 11/18/2013. She was also noted to have a fever of 101.60F at home. The patient has had 3 hospital admissions in the last 3 months with similar presentations. She was most recently discharged from the hospital on 11/17/2013 after treatment for HCAP. She was sent home with Augmentin x2 days. The patient has only had 2 doses of Revlimid (last on 11/10/13) since her bone marrow biopsy diagnosis of refractory anemia with excess blasts (Dr. Julien Nordmann). The patient was noted to be hypoxemic with oxygen saturation in the 80s and hypotensive in emergency department. The patient required a short period of Neo-Synephrine after which he was weaned off. She required sedation with Haldol and Ativan due to agitation. The patient was started on intravenous vancomycin and Zosyn for HCAP. CT of the chest reveals patchy groundglass opacities in the bilateral upper lobes as well as confluent opacities in the LLL and RML. As per the husband, there is been no history of vomiting, diarrhea, hematochezia, melena. There've been no other changes in her medications except for antibiotics and recent Revlimid.  Assessment/Plan: Sepsis -Present at the time of admission -Secondary to pneumonia -IV antibiotics -IV fluids Acute respiratory failure -Secondary to pneumonia -Supplemental oxygen -Pulmonary hygiene -Aerosolized bronchodilators  -Presently on 4 L [place]--> oxygen saturation 98% HCAP -Concern about recurrent aspiration pneumonitis  -Obtain speech therapy evaluation once the patient is more alert -Continue empiric vancomycin and Zosyn  pending culture data  -influenza PCR negative on 11/11/2013 chronic atrial fibrillation -Rate controlled -Restart lower dose metoprolol tartrate if blood pressure remains stable -Continue warfarin 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  Pulmonary edema.  EXAM: CHEST  2 VIEW  COMPARISON:  11/14/2013.  FINDINGS: Compared to the prior study, of the pulmonary aeration has improved. There is persistent basilar opacity, greater on the left than right, some of which probably represents residual Edema and some atelectasis. Superimposed pneumonia at cannot be excluded but is not favored.  The cardiopericardial silhouette is within normal limits. Postoperative changes CABG. Monitoring leads project over the chest. Small bilateral pleural effusions remain present with blunting of both costophrenic angles.  IMPRESSION: Improving CHF with mild persistent airspace disease and atelectasis small residual bilateral pleural effusions.   Electronically Signed   By: Dereck Ligas M.D.   On: 11/16/2013 10:38   Dg Chest 2 View  11/12/2013   CLINICAL DATA:  Cough and fever  EXAM: CHEST  2 VIEW  COMPARISON:  DG CHEST 1V PORT dated 11/11/2013  FINDINGS: Sternotomy wires overlie normal cardiac silhouette. There is a left retrocardiac opacity which is subtle. Right lung base is clear. Upper lobes are clear. There is a chronic bronchitic markings.  IMPRESSION: Left lower lobe density representing atelectasis versus infiltrate.   Electronically Signed   By: Suzy Bouchard M.D.   On: 11/12/2013 11:44   Dg Chest 2 View  11/06/2013   CLINICAL DATA:  Shortness of breath.  Cough.  EXAM: CHEST  2 VIEW  COMPARISON:  DG CHEST 1V PORT dated 10/22/2013  FINDINGS: Interim near complete resolution of changes of congestive heart failure and edema noted. Stable cardiomegaly. Prior CABG. No pleural effusion or pneumothorax. Degenerative changes thoracic spine. Mild atelectasis lung bases.  IMPRESSION: Interim near complete resolution of pulmonary edema .   Electronically Signed   By: Marcello Moores  Register   On: 11/06/2013 13:55   Ct Head Wo Contrast  12/08/2013   CLINICAL DATA:  Altered mental status  EXAM: CT HEAD WITHOUT CONTRAST  TECHNIQUE: Contiguous axial images were obtained from the base of the skull through the vertex without intravenous contrast.  COMPARISON:  11/12/2011  FINDINGS: Ventricles are normal in configuration there is ventricular and sulcal enlargement reflecting atrophy. No hydrocephalus.  No parenchymal masses or mass effect there is no evidence of a cortical infarct.  There are no extra-axial masses or abnormal fluid collections.  No intracranial hemorrhage.  Visualized sinuses and mastoid air cells are clear.  IMPRESSION: 1. No acute intracranial abnormalities.  No intracranial hemorrhage. 2. Stable atrophy.   Electronically Signed   By: Lajean Manes M.D.   On: 12/01/2013 18:05   Ct Head Wo Contrast  11/11/2013   CLINICAL DATA:  Altered mental status, headache, on blood thinner  EXAM: CT HEAD WITHOUT CONTRAST  TECHNIQUE: Contiguous axial images were obtained from the base of the skull through the vertex without intravenous contrast.  COMPARISON:  Prior CT head 10/18/2012  FINDINGS: Negative for acute intracranial hemorrhage, acute infarction, mass, mass effect, hydrocephalus or midline shift. Gray-white differentiation is preserved throughout.  Global cerebral and cerebellar volume loss. Stable periventricular and subcortical white matter hypoattenuation most consistent with sequelae of longstanding microvascular ischemia. Symmetric and unremarkable appearance of the globes and orbits. Normal aeration of the mastoid air cells and visualized paranasal sinuses. Atherosclerotic calcifications in the bilateral cavernous and supra clinoid internal carotid arteries.  IMPRESSION: 1. No acute intracranial process. 2. Stable cerebral atrophy and chronic microvascular ischemic white matter disease. 3. Intracranial atherosclerosis.   Electronically Signed   By: Jacqulynn Cadet M.D.   On: 11/11/2013 07:23   Ct Chest Wo Contrast  11/26/2013   ADDENDUM REPORT: 12/15/2013 18:00  ADDENDUM: Not mentioned in the original report. There are secretions partly filling the distal trachea at the carina extending into the left mainstem bronchus.   Electronically Signed   By: Lajean Manes M.D.   On: 12/14/2013 18:00   12/06/2013   CLINICAL DATA:  Hypoxia.  Evaluate for recurrent pneumonia.  EXAM: CT CHEST WITHOUT CONTRAST  TECHNIQUE: Multidetector CT imaging of the chest was performed following the standard protocol without IV contrast.  COMPARISON:  Current chest radiograph.  Chest CT, 08/31/2013.  FINDINGS: There is patchy ground-glass opacity that predominates in the upper lobes. There is focal, masslike, confluent opacity in several locations, most prominently in the left lower lobe and in the anteromedial right middle lobe at the lung base. There are minimal right and small left pleural effusions. Underlying emphysema is noted in the upper lobes.  Heart is mildly enlarged. There are dense coronary artery calcifications  Mediastinal adenopathy is noted. Reference measurements were made of several nodes There is a precarinal lymph node measuring 16 mm  in short axis. There is a prevascular node measuring 16 mm short axis. A number prevascular node adjacent to the AP window  measures 11 mm in short axis. A subcarinal node measures 15 mm short axis. Bile hilar adenopathy is noted not well-defined due to lack of contrast. Adenopathy has mildly increased when compared to the prior study.  Limited evaluation of the upper abdomen is unremarkable.  No osteoblastic or osteolytic lesions. There is a mild compression fracture of the upper endplate of T4. This is stable.  IMPRESSION: 1. Findings are consistent with extensive, bilateral, multifocal pneumonia. There are patchy areas of upper lobe predominant ground-glass opacity. There also focal masslike areas of consolidation predominating in the left lower lobe and right anterior medial middle lobe. There is associated mediastinal and hilar adenopathy. Minimal right and small left effusions.  Electronically Signed: By: Lajean Manes M.D. On: 12/16/2013 17:41   Dg Chest Port 1 View  11/26/2013   CLINICAL DATA:  Shortness of breath.  EXAM: PORTABLE CHEST - 1 VIEW  COMPARISON:  11/16/2013  FINDINGS: Previous median sternotomy and CABG procedure. There is moderate cardiac enlargement. Persistent patchy airspace opacities within the left lung are identified. There is mild superimposed pulmonary edema.  IMPRESSION: 1. Cardiac enlargement and mild edema suggest CHF. 2. Persistent left lung opacities compatible with multifocal infection.   Electronically Signed   By: Kerby Moors M.D.   On: 11/26/2013 08:27   Dg Chest Port 1 View  11/14/2013   CLINICAL DATA:  Fever and confusion.  Concern for pneumonia.  EXAM: PORTABLE CHEST - 1 VIEW  COMPARISON:  Chest radiograph performed 11/12/2013  FINDINGS: The lungs are well expanded. Vascular congestion is noted. Patchy bilateral airspace opacities are seen, worse on the left. This may reflect pneumonia or pulmonary edema. No definite pleural effusion or pneumothorax is seen. The left costophrenic angle is incompletely imaged on this study.  The cardiomediastinal silhouette remains normal in size. The  patient is status post median sternotomy. There is a chronic fracture of the second superiormost sternal wire. No acute osseous abnormalities are seen.  IMPRESSION: Vascular congestion noted. Patchy bilateral airspace opacities, worse on the left. This may reflect pneumonia or pulmonary edema.   Electronically Signed   By: Garald Balding M.D.   On: 11/14/2013 03:31   Dg Chest Port 1 View  11/11/2013   CLINICAL DATA:  Fever, cough, atrial fibrillation  EXAM: PORTABLE CHEST - 1 VIEW  COMPARISON:  DG CHEST 1V PORT dated 11/08/2013; DG CHEST 2 VIEW dated 11/06/2013; DG CHEST 1V PORT dated 10/22/2013  FINDINGS: Mild cardiac enlargement status post CABG. Vascular pattern is normal. There is no consolidation, effusion, or pneumothorax.  IMPRESSION: No active disease.   Electronically Signed   By: Skipper Cliche M.D.   On: 11/11/2013 07:03   Dg Chest Portable 1 View  11/08/2013   CLINICAL DATA:  Fever.  Cough.  History of heart disease.  EXAM: PORTABLE CHEST - 1 VIEW  COMPARISON:  DG CHEST 2 VIEW dated 11/06/2013; CT CHEST W/CM dated 08/31/2013; DG CHEST 2 VIEW dated 07/25/2013  FINDINGS: Prior CABG. Mild cardiomegaly. Indistinct pulmonary vasculature. Emphysema. No overt edema.  IMPRESSION: 1. Cardiomegaly with mild indistinctness of the pulmonary vasculature which could reflect pulmonary venous hypertension. No overt edema. 2. Emphysema.   Electronically Signed   By: Sherryl Barters M.D.   On: 11/08/2013 07:26   Dg Chest Port 1 View  (if Code Sepsis Called)  10/22/2013   CLINICAL DATA:  Shortness of breath, fever, altered mental status  EXAM: PORTABLE CHEST - 1 VIEW  COMPARISON:  09/06/2013, 08/31/2013  FINDINGS: Mild background emphysema noted. Prior coronary bypass changes. Heart is enlarged with central vascular congestion. No effusion or pneumothorax. Increased patchy airspace opacity in the left hilar region and also the left lower lobe retrocardiac area. This could represent pneumonia. Trachea is midline.  Atherosclerosis of the aorta. Artifact overlies the left apex.  IMPRESSION: Cardiomegaly with vascular congestion  Patchy left perihilar and retrocardiac left lower lobe airspace process concerning for pneumonia.   Electronically Signed   By: Daryll Brod M.D.   On: 10/22/2013 08:47         Subjective:  patient is encephalopathic at this time but arouses to voice. No respiratory distress or vomiting. The patient was agitated requiring sedation last night. No diarrhea or vomiting noted.   Objective: Filed Vitals:   11/20/13 0500 11/20/13 0600 11/20/13 0700 11/20/13 0800  BP: 133/62 126/75 125/73 144/59  Pulse: 109 124 113 100  Temp:    98.4 F (36.9 C)  TempSrc:    Axillary  Resp: _0 Height:      Weight:      SpO2: 97% 100% 100% 98%    Intake/Output Summary (Last 24 hours) at 11/20/13 0830 Last data filed at 11/20/13 0800  Gross per 24 hour  Intake 5547.38 ml  Output   1095 ml  Net 4452.38 ml   Weight change:  Exam:   General:  Pt is encephalopathic but arouses to voice, not in acute distress  HEENT: No icterus, No thrush,  [place]/AT, no meningismus  Cardiovascular: RRR, S1/S2, no rubs, no gallops  Respiratory: bilateral scattered rhonchi. No wheezing.   Abdomen: Soft/+BS, non tender, non distended, no guarding  Extremities: No edema, No lymphangitis, No petechiae, No rashes, no synovitis  Data Reviewed: Basic Metabolic Panel:  Recent Labs Lab 11/14/13 0235 11/14/13 0420 11/15/13 0510 12/12/2013 0735  NA 136*  --  140 140  K 4.5  --  4.4 4.4  CL 101  --  106 102  CO2 21  --  23 21  GLUCOSE 90  --  94 110*  BUN 12  --  13 11  CREATININE 1.14*  --  1.13* 1.17*  CALCIUM 7.9*  --  8.5 8.6  MG  --  1.6  --   --    Liver Function Tests:  Recent Labs Lab 11/18/2013 0735  AST 38*  ALT 14  ALKPHOS 122*  BILITOT 1.1  PROT 7.8  ALBUMIN 2.9*   No results found for this basename: LIPASE, AMYLASE,  in the last 168 hours No results found for this  basename: AMMONIA,  in the last 168 hours CBC:  Recent Labs Lab 11/14/13 0235 11/15/13 0510 11/16/13 0446 11/17/13 1321 12/13/2013 0735 11/20/13 0335  WBC 4.5 4.5  --   --  8.8 5.7  NEUTROABS 0.9*  --   --   --  1.6*  --   HGB 8.2* 7.9* 7.7* 9.0* 9.1* 7.1*  HCT 26.0* 25.4* 24.1* 28.6* 28.4* 22.9*  MCV 96.3 96.6  --   --  91.9 93.5  PLT 45* 42*  --   --  97* 77*   Cardiac Enzymes: No results found for this basename: CKTOTAL, CKMB, CKMBINDEX, TROPONINI,  in the last 168 hours BNP: No components found with this basename: POCBNP,  CBG:  Recent Labs Lab 12/02/2013 1509  GLUCAP 108*    Recent Results (  from the past 240 hour(s))  CULTURE, BLOOD (ROUTINE X 2)     Status: None   Collection Time    11/11/13  6:26 AM      Result Value Ref Range Status   Specimen Description BLOOD RIGHT WRIST   Final   Special Requests BOTTLES DRAWN AEROBIC AND ANAEROBIC [facility] EACH   Final   Culture  Setup Time     Final   Value: 11/11/2013 11:20     Performed at [facility]   Culture     Final   Value: NO GROWTH 5 DAYS     Performed at [facility]   Report Status 11/17/2013 FINAL   Final  CULTURE, BLOOD (ROUTINE X 2)     Status: None   Collection Time    11/11/13  6:35 AM      Result Value Ref Range Status   Specimen Description BLOOD LEFT HAND   Final   Special Requests BOTTLES DRAWN AEROBIC AND ANAEROBIC [facility] EACH   Final   Culture  Setup Time     Final   Value: 11/11/2013 11:20     Performed at [facility]   Culture     Final   Value: NO GROWTH 5 DAYS     Performed at [facility]   Report Status 11/17/2013 FINAL   Final  URINE CULTURE     Status: None   Collection Time    11/11/13  7:29 AM      Result Value Ref Range Status   Specimen Description URINE, CLEAN CATCH   Final   Special Requests NONE   Final   Culture  Setup Time     Final   Value: 11/11/2013 12:18     Performed at [place]     Final   Value: 4,000  COLONIES/ML     Performed at [facility]   Culture     Final   Value: INSIGNIFICANT GROWTH     Performed at [facility]   Report Status 11/12/2013 FINAL   Final  RAPID STREP SCREEN     Status: None   Collection Time    11/11/13 11:51 AM      Result Value Ref Range Status   Streptococcus, Group A Screen (Direct) NEGATIVE  NEGATIVE Final   Comment: (NOTE)     A Rapid Antigen test may result negative if the antigen level in the     sample is below the detection level of this test. The FDA has not     cleared this test as a stand-alone test therefore the rapid antigen     negative result has reflexed to a Group A Strep culture.  CULTURE, GROUP A STREP     Status: None   Collection Time    11/11/13 11:51 AM      Result Value Ref Range Status   Specimen Description THROAT   Final   Special Requests NONE   Final   Culture     Final   Value: No Beta Hemolytic Streptococci Isolated     Performed at [facility]   Report Status 11/13/2013 FINAL   Final  MRSA PCR SCREENING     Status: None   Collection Time    11/11/13  7:02 PM      Result Value Ref Range Status   MRSA by PCR NEGATIVE  NEGATIVE Final   Comment:  The GeneXpert MRSA Assay (FDA     approved for NASAL specimens     only), is one component of a     comprehensive MRSA colonization     surveillance program. It is not     intended to diagnose MRSA     infection nor to guide or     monitor treatment for     MRSA infections.  CULTURE, EXPECTORATED SPUTUM-ASSESSMENT     Status: None   Collection Time    11/13/13  4:03 PM      Result Value Ref Range Status   Specimen Description SPUTUM   Final   Special Requests Immunocompromised   Final   Sputum evaluation     Final   Value: THIS SPECIMEN IS ACCEPTABLE. RESPIRATORY CULTURE REPORT TO FOLLOW.   Report Status 11/13/2013 FINAL   Final  CULTURE, RESPIRATORY (NON-EXPECTORATED)     Status: None   Collection Time    11/13/13  4:03 PM       Result Value Ref Range Status   Specimen Description SPUTUM   Final   Special Requests NONE   Final   Gram Stain     Final   Value: NO WBC SEEN     NO SQUAMOUS EPITHELIAL CELLS SEEN     NO ORGANISMS SEEN     Performed at [facility]   Culture     Final   Value: RARE FUNGUS (MOLD) ISOLATED, PROBABLE CONTAMINANT/COLONIZER (SAPROPHYTE). CONTACT MICROBIOLOGY IF FURTHER IDENTIFICATION REQUIRED [phone].     Performed at [facility]   Report Status 11/16/2013 FINAL   Final  CULTURE, BLOOD (ROUTINE X 2)     Status: None   Collection Time    11/14/13  2:50 AM      Result Value Ref Range Status   Specimen Description BLOOD RIGHT ARM   Final   Special Requests BOTTLES DRAWN AEROBIC AND ANAEROBIC 5CC   Final   Culture  Setup Time     Final   Value: 11/14/2013 09:35     Performed at [facility]   Culture     Final   Value: NO GROWTH 5 DAYS     Performed at [facility]   Report Status 11/20/2013 FINAL   Final  CULTURE, BLOOD (ROUTINE X 2)     Status: None   Collection Time    11/14/13  2:55 AM      Result Value Ref Range Status   Specimen Description BLOOD RIGHT HAND   Final   Special Requests BOTTLES DRAWN AEROBIC ONLY 3CC   Final   Culture  Setup Time     Final   Value: 11/14/2013 09:35     Performed at [facility]   Culture     Final   Value: NO GROWTH 5 DAYS     Performed at [facility]   Report Status 11/20/2013 FINAL   Final  CULTURE, BLOOD (ROUTINE X 2)     Status: None   Collection Time    12/15/2013  7:35 AM      Result Value Ref Range Status   Specimen Description BLOOD RIGHT WRIST   Final   Special Requests BOTTLES DRAWN AEROBIC AND ANAEROBIC 4CC [facility]   Final   Culture  Setup Time     Final   Value: 11/18/2013 11:54     Performed at [facility]   Culture     Final   Value:  BLOOD CULTURE RECEIVED NO GROWTH TO DATE CULTURE WILL BE HELD FOR 5 DAYS BEFORE ISSUING A FINAL NEGATIVE REPORT     Performed at  [facility]   Report Status PENDING   Incomplete  CULTURE, BLOOD (ROUTINE X 2)     Status: None   Collection Time    12/08/2013  8:05 AM      Result Value Ref Range Status   Specimen Description BLOOD LEFT HAND   Final   Special Requests BOTTLES DRAWN AEROBIC AND ANAEROBIC [facility] EACH   Final   Culture  Setup Time     Final   Value: 12/01/2013 11:54     Performed at [facility]   Culture     Final   Value:        BLOOD CULTURE RECEIVED NO GROWTH TO DATE CULTURE WILL BE HELD FOR 5 DAYS BEFORE ISSUING A FINAL NEGATIVE REPORT     Performed at [facility]   Report Status PENDING   Incomplete  MRSA PCR SCREENING     Status: Abnormal   Collection Time    11/26/2013 11:09 AM      Result Value Ref Range Status   MRSA by PCR POSITIVE (*) NEGATIVE Final   Comment:            The GeneXpert MRSA Assay (FDA     approved for NASAL specimens     only), is one component of a     comprehensive MRSA colonization     surveillance program. It is not     intended to diagnose MRSA     infection nor to guide or     monitor treatment for     MRSA infections.     RESULT CALLED TO, READ BACK BY AND VERIFIED WITH:     Johnnette Litter [id number] @ 0272 BY J SCOTTON     Scheduled Meds: . ipratropium-albuterol  3 mL Nebulization QID  . piperacillin-tazobactam (ZOSYN)  IV  3.375 g Intravenous Q8H  . vancomycin  1,250 mg Intravenous Q24H   Continuous Infusions: . sodium chloride 75 mL/hr at 12/09/2013 2300  . phenylephrine (NEO-SYNEPHRINE) Adult infusion Stopped (12/06/2013 2300)     Orson Eva, DO  [name] Pager [phone]  If 7PM-7AM, please contact night-coverage [email or web address] Password [email or web address] 11/20/2013, 8:30 AM   LOS: 1 day

## 2013-11-20 NOTE — Progress Notes (Signed)
ANTIBIOTIC CONSULT NOTE - FOLLOW UP  Pharmacy Consult for Vancomycin, Zosyn Indication: pneumonia  No Known Allergies  Patient Measurements: Height: 5' 7.5" (171.5 cm) Weight: 217 lb 2.5 oz (98.5 kg) IBW/kg (Calculated) : 62.75  Vital Signs: Temp: 98.4 F (36.9 C) (05/04 0800) Temp src: Axillary (05/04 0800) BP: 131/63 mmHg (05/04 0900) Pulse Rate: 104 (05/04 0900) Intake/Output from previous day: 05/03 0701 - 05/04 0700 In: 5212.4 [P.O.:60; I.V.:3859.4; Blood:550; IV Piggyback:150] Out: 3151 [VOHYW:7371]  Labs:  Recent Labs  11/17/13 1321 12/14/2013 0735 11/20/13 0335  WBC  --  8.8 5.7  HGB 9.0* 9.1* 7.1*  PLT  --  97* 77*  CREATININE  --  1.17*  --    Estimated Creatinine Clearance: 52.9 ml/min (by C-G formula based on Cr of 1.17). No results found for this basename: VANCOTROUGH, VANCOPEAK, VANCORANDOM, [place], GENTPEAK, GENTRANDOM, [place], TOBRAPEAK, TOBRARND, AMIKACINPEAK, AMIKACINTROU, AMIKACIN,  in the last 72 hours   Microbiology: Recent Results (from the past 720 hour(s))  CULTURE, BLOOD (ROUTINE X 2)     Status: None   Collection Time    10/22/13  8:44 AM      Result Value Ref Range Status   Specimen Description BLOOD RIGHT ANTECUBITAL   Final   Special Requests BOTTLES DRAWN AEROBIC AND ANAEROBIC 5 CC EACH   Final   Culture  Setup Time     Final   Value: 10/22/2013 15:20     Performed at [facility]   Culture     Final   Value: NO GROWTH 5 DAYS     Performed at [facility]   Report Status 10/28/2013 FINAL   Final  CULTURE, BLOOD (ROUTINE X 2)     Status: None   Collection Time    10/22/13  8:44 AM      Result Value Ref Range Status   Specimen Description BLOOD BLOOD LEFT FOREARM   Final   Special Requests BOTTLES DRAWN AEROBIC AND ANAEROBIC 4 CC [facility]   Final   Culture  Setup Time     Final   Value: 10/22/2013 15:20     Performed at [facility]   Culture     Final   Value: NO GROWTH 5 DAYS     Performed at  [facility]   Report Status 10/28/2013 FINAL   Final  URINE CULTURE     Status: None   Collection Time    10/22/13 11:41 AM      Result Value Ref Range Status   Specimen Description URINE, CLEAN CATCH   Final   Special Requests NONE   Final   Culture  Setup Time     Final   Value: 10/22/2013 19:23     Performed at [place]     Final   Value: NO GROWTH     Performed at [facility]   Culture     Final   Value: NO GROWTH     Performed at [facility]   Report Status 10/23/2013 FINAL   Final  MRSA PCR SCREENING     Status: Abnormal   Collection Time    10/22/13 11:43 AM      Result Value Ref Range Status   MRSA by PCR POSITIVE (*) NEGATIVE Final   Comment:            The GeneXpert MRSA Assay (FDA     approved for NASAL specimens  only), is one component of a     comprehensive MRSA colonization     surveillance program. It is not     intended to diagnose MRSA     infection nor to guide or     monitor treatment for     MRSA infections.     RESULT CALLED TO, READ BACK BY AND VERIFIED WITH:     D.VAUDREUIL AT 1504 ON [id number] BY C.BONGEL  CULTURE, EXPECTORATED SPUTUM-ASSESSMENT     Status: None   Collection Time    10/23/13 11:10 AM      Result Value Ref Range Status   Specimen Description SPUTUM   Final   Special Requests NONE   Final   Sputum evaluation     Final   Value: THIS SPECIMEN IS ACCEPTABLE. RESPIRATORY CULTURE REPORT TO FOLLOW.   Report Status 10/23/2013 FINAL   Final  CULTURE, RESPIRATORY (NON-EXPECTORATED)     Status: None   Collection Time    10/23/13 11:10 AM      Result Value Ref Range Status   Specimen Description SPUTUM   Final   Special Requests NONE   Final   Gram Stain     Final   Value: NO WBC SEEN     NO SQUAMOUS EPITHELIAL CELLS SEEN     NO ORGANISMS SEEN     Performed at [facility]   Culture     Final   Value: NORMAL OROPHARYNGEAL FLORA     Performed at [facility]    Report Status 10/25/2013 FINAL   Final  TECHNOLOGIST REVIEW     Status: None   Collection Time    11/02/13 10:33 AM      Result Value Ref Range Status   Technologist Review 2% blast, rare nrbc, Metas and Myelocytes present   Final  CULTURE, BLOOD (ROUTINE X 2)     Status: None   Collection Time    11/11/13  6:26 AM      Result Value Ref Range Status   Specimen Description BLOOD RIGHT WRIST   Final   Special Requests BOTTLES DRAWN AEROBIC AND ANAEROBIC 5CC EACH   Final   Culture  Setup Time     Final   Value: 11/11/2013 11:20     Performed at [facility]   Culture     Final   Value: NO GROWTH 5 DAYS     Performed at [facility]   Report Status 11/17/2013 FINAL   Final  CULTURE, BLOOD (ROUTINE X 2)     Status: None   Collection Time    11/11/13  6:35 AM      Result Value Ref Range Status   Specimen Description BLOOD LEFT HAND   Final   Special Requests BOTTLES DRAWN AEROBIC AND ANAEROBIC [facility] EACH   Final   Culture  Setup Time     Final   Value: 11/11/2013 11:20     Performed at [facility]   Culture     Final   Value: NO GROWTH 5 DAYS     Performed at [facility]   Report Status 11/17/2013 FINAL   Final  URINE CULTURE     Status: None   Collection Time    11/11/13  7:29 AM      Result Value Ref Range Status   Specimen Description URINE, CLEAN CATCH   Final   Special Requests NONE   Final   Culture  Setup Time  Final   Value: 11/11/2013 12:18     Performed at [place]     Final   Value: 4,000 COLONIES/ML     Performed at [facility]   Culture     Final   Value: INSIGNIFICANT GROWTH     Performed at [facility]   Report Status 11/12/2013 FINAL   Final  RAPID STREP SCREEN     Status: None   Collection Time    11/11/13 11:51 AM      Result Value Ref Range Status   Streptococcus, Group A Screen (Direct) NEGATIVE  NEGATIVE Final   Comment: (NOTE)     A Rapid Antigen test may result  negative if the antigen level in the     sample is below the detection level of this test. The FDA has not     cleared this test as a stand-alone test therefore the rapid antigen     negative result has reflexed to a Group A Strep culture.  CULTURE, GROUP A STREP     Status: None   Collection Time    11/11/13 11:51 AM      Result Value Ref Range Status   Specimen Description THROAT   Final   Special Requests NONE   Final   Culture     Final   Value: No Beta Hemolytic Streptococci Isolated     Performed at [facility]   Report Status 11/13/2013 FINAL   Final  MRSA PCR SCREENING     Status: None   Collection Time    11/11/13  7:02 PM      Result Value Ref Range Status   MRSA by PCR NEGATIVE  NEGATIVE Final   Comment:            The GeneXpert MRSA Assay (FDA     approved for NASAL specimens     only), is one component of a     comprehensive MRSA colonization     surveillance program. It is not     intended to diagnose MRSA     infection nor to guide or     monitor treatment for     MRSA infections.  CULTURE, EXPECTORATED SPUTUM-ASSESSMENT     Status: None   Collection Time    11/13/13  4:03 PM      Result Value Ref Range Status   Specimen Description SPUTUM   Final   Special Requests Immunocompromised   Final   Sputum evaluation     Final   Value: THIS SPECIMEN IS ACCEPTABLE. RESPIRATORY CULTURE REPORT TO FOLLOW.   Report Status 11/13/2013 FINAL   Final  CULTURE, RESPIRATORY (NON-EXPECTORATED)     Status: None   Collection Time    11/13/13  4:03 PM      Result Value Ref Range Status   Specimen Description SPUTUM   Final   Special Requests NONE   Final   Gram Stain     Final   Value: NO WBC SEEN     NO SQUAMOUS EPITHELIAL CELLS SEEN     NO ORGANISMS SEEN     Performed at [facility]   Culture     Final   Value: RARE FUNGUS (MOLD) ISOLATED, PROBABLE CONTAMINANT/COLONIZER (SAPROPHYTE). CONTACT MICROBIOLOGY IF FURTHER IDENTIFICATION REQUIRED [phone].      Performed at [facility]   Report Status 11/16/2013 FINAL   Final  CULTURE, BLOOD (ROUTINE X 2)  Status: None   Collection Time    11/14/13  2:50 AM      Result Value Ref Range Status   Specimen Description BLOOD RIGHT ARM   Final   Special Requests BOTTLES DRAWN AEROBIC AND ANAEROBIC 5CC   Final   Culture  Setup Time     Final   Value: 11/14/2013 09:35     Performed at [facility]   Culture     Final   Value: NO GROWTH 5 DAYS     Performed at [facility]   Report Status 11/20/2013 FINAL   Final  CULTURE, BLOOD (ROUTINE X 2)     Status: None   Collection Time    11/14/13  2:55 AM      Result Value Ref Range Status   Specimen Description BLOOD RIGHT HAND   Final   Special Requests BOTTLES DRAWN AEROBIC ONLY 3CC   Final   Culture  Setup Time     Final   Value: 11/14/2013 09:35     Performed at [facility]   Culture     Final   Value: NO GROWTH 5 DAYS     Performed at [facility]   Report Status 11/20/2013 FINAL   Final  CULTURE, BLOOD (ROUTINE X 2)     Status: None   Collection Time    11/29/2013  7:35 AM      Result Value Ref Range Status   Specimen Description BLOOD RIGHT WRIST   Final   Special Requests BOTTLES DRAWN AEROBIC AND ANAEROBIC 4CC [facility]   Final   Culture  Setup Time     Final   Value: 11/29/2013 11:54     Performed at [facility]   Culture     Final   Value:        BLOOD CULTURE RECEIVED NO GROWTH TO DATE CULTURE WILL BE HELD FOR 5 DAYS BEFORE ISSUING A FINAL NEGATIVE REPORT     Performed at [facility]   Report Status PENDING   Incomplete  CULTURE, BLOOD (ROUTINE X 2)     Status: None   Collection Time    12/15/2013  8:05 AM      Result Value Ref Range Status   Specimen Description BLOOD LEFT HAND   Final   Special Requests BOTTLES DRAWN AEROBIC AND ANAEROBIC [facility] EACH   Final   Culture  Setup Time     Final   Value: 11/25/2013 11:54     Performed at [facility]   Culture     Final    Value:        BLOOD CULTURE RECEIVED NO GROWTH TO DATE CULTURE WILL BE HELD FOR 5 DAYS BEFORE ISSUING A FINAL NEGATIVE REPORT     Performed at [facility]   Report Status PENDING   Incomplete  MRSA PCR SCREENING     Status: Abnormal   Collection Time    12/10/2013 11:09 AM      Result Value Ref Range Status   MRSA by PCR POSITIVE (*) NEGATIVE Final   Comment:            The GeneXpert MRSA Assay (FDA     approved for NASAL specimens     only), is one component of a     comprehensive MRSA colonization     surveillance program. It is not     intended to diagnose MRSA     infection nor to guide  or     monitor treatment for     MRSA infections.     RESULT CALLED TO, READ BACK BY AND VERIFIED WITH:     Johnnette Litter [id number] @ [id number] BY J SCOTTON   Anti-infectives: 5/3 >> Zosyn >> 5/3 >> vancomycin >>    Assessment: 64 yoF admitted 5/3 with concern for recurrent pneumonia and sepsis.  She was recently discharged on 5/1 after treatment for HCAP on Augmentin PO therapy, but returned due to increasing confusion.  Pharmacy is consulted to dose vancomycin and Zosyn.   5/4  Day # 2 vancomycin, Zosyn  Tmax: 102.9, Tc 98.4  WBCs: WNL  Renal: SCr 1.17, CrCl 53 ml/min CG (~49 ml/min Normalized)  PCT:  3.67 (5/4)  10/2013 admission with vanc dosing 750mg  IV q12 hours resulted in supratherapeutic trough value of 27.3.  At that time, SCr was elevated ~ 1.5.  Goal of Therapy:  Vancomycin trough level 15-20 mcg/ml  Plan:   Zosyn 3.375g IV Q8H infused over 4hrs.  Change to Vancomycin 750mg  IV q12h.  Measure Vanc trough at steady state.  Follow up renal fxn and culture results.   Gretta Arab PharmD, BCPS Pager [phone] 11/20/2013 2:36 PM

## 2013-11-20 NOTE — Telephone Encounter (Signed)
Encounter Closed---11/20/13 TP 

## 2013-11-21 DIAGNOSIS — R404 Transient alteration of awareness: Secondary | ICD-10-CM

## 2013-11-21 DIAGNOSIS — E669 Obesity, unspecified: Secondary | ICD-10-CM

## 2013-11-21 LAB — URINALYSIS, ROUTINE W REFLEX MICROSCOPIC
GLUCOSE, UA: NEGATIVE mg/dL
KETONES UR: NEGATIVE mg/dL
Leukocytes, UA: NEGATIVE
Nitrite: NEGATIVE
Specific Gravity, Urine: 1.029 (ref 1.005–1.030)
Urobilinogen, UA: 1 mg/dL (ref 0.0–1.0)
pH: 6 (ref 5.0–8.0)

## 2013-11-21 LAB — CBC
HCT: 27 % — ABNORMAL LOW (ref 36.0–46.0)
Hemoglobin: 8.8 g/dL — ABNORMAL LOW (ref 12.0–15.0)
MCH: 29.8 pg (ref 26.0–34.0)
MCHC: 32.6 g/dL (ref 30.0–36.0)
MCV: 91.5 fL (ref 78.0–100.0)
PLATELETS: 70 10*3/uL — AB (ref 150–400)
RBC: 2.95 MIL/uL — AB (ref 3.87–5.11)
RDW: 22.8 % — ABNORMAL HIGH (ref 11.5–15.5)
WBC: 4.7 10*3/uL (ref 4.0–10.5)

## 2013-11-21 LAB — URINE MICROSCOPIC-ADD ON

## 2013-11-21 LAB — BASIC METABOLIC PANEL
BUN: 9 mg/dL (ref 6–23)
CHLORIDE: 108 meq/L (ref 96–112)
CO2: 20 mEq/L (ref 19–32)
CREATININE: 1.08 mg/dL (ref 0.50–1.10)
Calcium: 7.6 mg/dL — ABNORMAL LOW (ref 8.4–10.5)
GFR calc non Af Amer: 50 mL/min — ABNORMAL LOW (ref >90)
GFR, EST AFRICAN AMERICAN: 58 mL/min — AB (ref >90)
Glucose, Bld: 128 mg/dL — ABNORMAL HIGH (ref 70–99)
Potassium: 3.5 mEq/L — ABNORMAL LOW (ref 3.7–5.3)
SODIUM: 142 meq/L (ref 137–147)

## 2013-11-21 LAB — PROCALCITONIN: PROCALCITONIN: 3.18 ng/mL

## 2013-11-21 LAB — PROTIME-INR
INR: 1.25 (ref 0.00–1.49)
PROTHROMBIN TIME: 15.4 s — AB (ref 11.6–15.2)

## 2013-11-21 MED ORDER — METOPROLOL TARTRATE 1 MG/ML IV SOLN
5.0000 mg | Freq: Four times a day (QID) | INTRAVENOUS | Status: DC
  Administered 2013-11-21 – 2013-11-22 (×4): 5 mg via INTRAVENOUS
  Filled 2013-11-21 (×7): qty 5

## 2013-11-21 MED ORDER — CHLORHEXIDINE GLUCONATE 0.12 % MT SOLN
15.0000 mL | Freq: Two times a day (BID) | OROMUCOSAL | Status: DC
  Administered 2013-11-21 – 2013-11-23 (×4): 15 mL via OROMUCOSAL
  Filled 2013-11-21 (×6): qty 15

## 2013-11-21 MED ORDER — LORAZEPAM 2 MG/ML IJ SOLN
0.5000 mg | INTRAMUSCULAR | Status: DC | PRN
  Administered 2013-11-21 – 2013-11-22 (×3): 1 mg via INTRAVENOUS
  Filled 2013-11-21 (×3): qty 1

## 2013-11-21 MED ORDER — BIOTENE DRY MOUTH MT LIQD
15.0000 mL | Freq: Two times a day (BID) | OROMUCOSAL | Status: DC
Start: 2013-11-22 — End: 2013-11-23
  Administered 2013-11-22 – 2013-11-23 (×3): 15 mL via OROMUCOSAL

## 2013-11-21 MED ORDER — MORPHINE SULFATE 2 MG/ML IJ SOLN
2.0000 mg | Freq: Once | INTRAMUSCULAR | Status: AC
  Administered 2013-11-21: 2 mg via INTRAVENOUS
  Filled 2013-11-21: qty 1

## 2013-11-21 MED ORDER — METOPROLOL TARTRATE 1 MG/ML IV SOLN
5.0000 mg | Freq: Once | INTRAVENOUS | Status: AC
  Administered 2013-11-21: 5 mg via INTRAVENOUS

## 2013-11-21 NOTE — Progress Notes (Signed)
PROGRESS NOTE  [name] JJO:[id number] DOB: 1940-11-15 DOA: 11/30/2013 PCP: Noralee Space, MD   Interim history  73 year old female with a history of myelodysplastic syndrome, plasma cell dyscrasia, COPD, chronic diastolic CHF, CKD stage III, chronic atrial fibrillation on anticoagulation, and hyperlipidemia presents with increasing confusion that began on the evening of 11/18/2013. She was also noted to have a fever of 101.102F at home. The patient has had 3 hospital admissions in the last 3 months with similar presentations. She was most recently discharged from the hospital on 11/17/2013 after treatment for HCAP. She was sent home with Augmentin x2 days. The patient has only had 2 doses of Revlimid (last on 11/10/13) since her bone marrow biopsy diagnosis of refractory anemia with excess blasts (Dr. Julien Nordmann). The patient was noted to be hypoxemic with oxygen saturation in the 80s and hypotensive in emergency department. Given 2L NS in ED.  The patient required a short period of Neo-Synephrine after which he was weaned off. She required sedation with Haldol and Ativan due to agitation. The patient was started on intravenous vancomycin and Zosyn for HCAP. CT of the chest reveals patchy groundglass opacities in the bilateral upper lobes as well as confluent opacities in the LLL and RML. As per the husband, there is been no history of vomiting, diarrhea, hematochezia, melena. There've been no other changes in her medications except for antibiotics and recent Revlimid. On 11/20/2013, the patient became severely agitated requiring 14 mg of Haldol and 5 mg of Ativan over 60 minutes, and she remained agitated the patient was placed on Precedex.  PCCM is following. Assessment/Plan:  Sepsis  -Present at the time of admission  -Secondary to pneumonia  -IV antibiotics  -IV fluids  Acute respiratory failure  -Secondary to pneumonia  -Supplemental oxygen  -Pulmonary hygiene  -Aerosolized  bronchodilators  -Presently on 4 L [place]--> oxygen saturation 94-96%  HCAP  -Concern about recurrent aspiration pneumonitis  -Obtain speech therapy evaluation once the patient is more alert  -Continue empiric vancomycin and Zosyn pending culture data  -influenza PCR negative on 11/11/2013  -Cultures negative to date chronic atrial fibrillation  -pt had RVR with aggitation, not reliably taking po meds -Metoprolol 5 mg IV every 6 hours started--> rate improved -warfarin on hold by PCCM -pt was given vitamin K (ordered by Dr. 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  -Transfused 1 unit PRBC 11/20/13 -drop in Hgb from 11/18/2013 to 11/20/13 likely due to dilution rather than blood loss as pt received >3L of fluid from 12/15/2013 to 11/20/13 -FOBT, but clinically no active bleed presently Chronic diastolic CHF  -EF 16-01%  -saline lock IVF--received lasix after PRBCs -daily weight -positive 4.6L for the admission -CXR and proBNP in am Acute metabolic encephalopathy  -Secondary to infectious process and acute respiratory failure  -11/20/13--pt remained aggitated after Haldol 76m and ativan 555m->placed on Precedex Polypharmacy -d/c all hypnotic meds including muscle relaxers -minimize/streamline meds prior to d/c -suspect pt has underlying dementia with decompensation in setting of critical illness -may try HS seroquel after stablized CKD stage III  -Baseline creatinine 1.1-1.5  -Continue to monitor  COPD  -Continue bronchodilators  Family Communication: Husband updated at beside  Disposition Plan: Home when medically stable  Antibiotics:  vancomycin 11/17/2013 >>>  Zosyn 11/28/2013 >>>    Procedures/Studies: Dg Chest 2 View  11/16/2013   CLINICAL DATA:  Short of breath.  Pulmonary edema.  EXAM: CHEST  2  VIEW  COMPARISON:  11/14/2013.  FINDINGS: Compared to the prior study, of the pulmonary aeration has improved. There is persistent basilar opacity, greater on  the left than right, some of which probably represents residual Edema and some atelectasis. Superimposed pneumonia at cannot be excluded but is not favored.  The cardiopericardial silhouette is within normal limits. Postoperative changes CABG. Monitoring leads project over the chest. Small bilateral pleural effusions remain present with blunting of both costophrenic angles.  IMPRESSION: Improving CHF with mild persistent airspace disease and atelectasis small residual bilateral pleural effusions.   Electronically Signed   By: Dereck Ligas M.D.   On: 11/16/2013 10:38   Dg Chest 2 View  11/12/2013   CLINICAL DATA:  Cough and fever  EXAM: CHEST  2 VIEW  COMPARISON:  DG CHEST 1V PORT dated 11/11/2013  FINDINGS: Sternotomy wires overlie normal cardiac silhouette. There is a left retrocardiac opacity which is subtle. Right lung base is clear. Upper lobes are clear. There is a chronic bronchitic markings.  IMPRESSION: Left lower lobe density representing atelectasis versus infiltrate.   Electronically Signed   By: Suzy Bouchard M.D.   On: 11/12/2013 11:44   Dg Chest 2 View  11/06/2013   CLINICAL DATA:  Shortness of breath.  Cough.  EXAM: CHEST  2 VIEW  COMPARISON:  DG CHEST 1V PORT dated 10/22/2013  FINDINGS: Interim near complete resolution of changes of congestive heart failure and edema noted. Stable cardiomegaly. Prior CABG. No pleural effusion or pneumothorax. Degenerative changes thoracic spine. Mild atelectasis lung bases.  IMPRESSION: Interim near complete resolution of pulmonary edema .   Electronically Signed   By: Marcello Moores  Register   On: 11/06/2013 13:55   Ct Head Wo Contrast  12/01/2013   CLINICAL DATA:  Altered mental status  EXAM: CT HEAD WITHOUT CONTRAST  TECHNIQUE: Contiguous axial images were obtained from the base of the skull through the vertex without intravenous contrast.  COMPARISON:  11/12/2011  FINDINGS: Ventricles are normal in configuration there is ventricular and sulcal enlargement  reflecting atrophy. No hydrocephalus.  No parenchymal masses or mass effect there is no evidence of a cortical infarct.  There are no extra-axial masses or abnormal fluid collections.  No intracranial hemorrhage.  Visualized sinuses and mastoid air cells are clear.  IMPRESSION: 1. No acute intracranial abnormalities.  No intracranial hemorrhage. 2. Stable atrophy.   Electronically Signed   By: Lajean Manes M.D.   On: 11/28/2013 18:05   Ct Head Wo Contrast  11/11/2013   CLINICAL DATA:  Altered mental status, headache, on blood thinner  EXAM: CT HEAD WITHOUT CONTRAST  TECHNIQUE: Contiguous axial images were obtained from the base of the skull through the vertex without intravenous contrast.  COMPARISON:  Prior CT head 10/18/2012  FINDINGS: Negative for acute intracranial hemorrhage, acute infarction, mass, mass effect, hydrocephalus or midline shift. Gray-white differentiation is preserved throughout. Global cerebral and cerebellar volume loss. Stable periventricular and subcortical white matter hypoattenuation most consistent with sequelae of longstanding microvascular ischemia. Symmetric and unremarkable appearance of the globes and orbits. Normal aeration of the mastoid air cells and visualized paranasal sinuses. Atherosclerotic calcifications in the bilateral cavernous and supra clinoid internal carotid arteries.  IMPRESSION: 1. No acute intracranial process. 2. Stable cerebral atrophy and chronic microvascular ischemic white matter disease. 3. Intracranial atherosclerosis.   Electronically Signed   By: Jacqulynn Cadet M.D.   On: 11/11/2013 07:23   Ct Chest Wo Contrast  12/17/2013   ADDENDUM REPORT: 11/20/2013 18:00  ADDENDUM:  Not mentioned in the original report. There are secretions partly filling the distal trachea at the carina extending into the left mainstem bronchus.   Electronically Signed   By: Lajean Manes M.D.   On: 12/07/2013 18:00   11/18/2013   CLINICAL DATA:  Hypoxia.  Evaluate for  recurrent pneumonia.  EXAM: CT CHEST WITHOUT CONTRAST  TECHNIQUE: Multidetector CT imaging of the chest was performed following the standard protocol without IV contrast.  COMPARISON:  Current chest radiograph.  Chest CT, 08/31/2013.  FINDINGS: There is patchy ground-glass opacity that predominates in the upper lobes. There is focal, masslike, confluent opacity in several locations, most prominently in the left lower lobe and in the anteromedial right middle lobe at the lung base. There are minimal right and small left pleural effusions. Underlying emphysema is noted in the upper lobes.  Heart is mildly enlarged. There are dense coronary artery calcifications  Mediastinal adenopathy is noted. Reference measurements were made of several nodes There is a precarinal lymph node measuring 16 mm in short axis. There is a prevascular node measuring 16 mm short axis. A number prevascular node adjacent to the AP window measures 11 mm in short axis. A subcarinal node measures 15 mm short axis. Bile hilar adenopathy is noted not well-defined due to lack of contrast. Adenopathy has mildly increased when compared to the prior study.  Limited evaluation of the upper abdomen is unremarkable.  No osteoblastic or osteolytic lesions. There is a mild compression fracture of the upper endplate of T4. This is stable.  IMPRESSION: 1. Findings are consistent with extensive, bilateral, multifocal pneumonia. There are patchy areas of upper lobe predominant ground-glass opacity. There also focal masslike areas of consolidation predominating in the left lower lobe and right anterior medial middle lobe. There is associated mediastinal and hilar adenopathy. Minimal right and small left effusions.  Electronically Signed: By: Lajean Manes M.D. On: 11/17/2013 17:41   Dg Chest Port 1 View  11/30/2013   CLINICAL DATA:  Shortness of breath.  EXAM: PORTABLE CHEST - 1 VIEW  COMPARISON:  11/16/2013  FINDINGS: Previous median sternotomy and CABG  procedure. There is moderate cardiac enlargement. Persistent patchy airspace opacities within the left lung are identified. There is mild superimposed pulmonary edema.  IMPRESSION: 1. Cardiac enlargement and mild edema suggest CHF. 2. Persistent left lung opacities compatible with multifocal infection.   Electronically Signed   By: Kerby Moors M.D.   On: 11/28/2013 08:27   Dg Chest Port 1 View  11/14/2013   CLINICAL DATA:  Fever and confusion.  Concern for pneumonia.  EXAM: PORTABLE CHEST - 1 VIEW  COMPARISON:  Chest radiograph performed 11/12/2013  FINDINGS: The lungs are well expanded. Vascular congestion is noted. Patchy bilateral airspace opacities are seen, worse on the left. This may reflect pneumonia or pulmonary edema. No definite pleural effusion or pneumothorax is seen. The left costophrenic angle is incompletely imaged on this study.  The cardiomediastinal silhouette remains normal in size. The patient is status post median sternotomy. There is a chronic fracture of the second superiormost sternal wire. No acute osseous abnormalities are seen.  IMPRESSION: Vascular congestion noted. Patchy bilateral airspace opacities, worse on the left. This may reflect pneumonia or pulmonary edema.   Electronically Signed   By: Garald Balding M.D.   On: 11/14/2013 03:31   Dg Chest Port 1 View  11/11/2013   CLINICAL DATA:  Fever, cough, atrial fibrillation  EXAM: PORTABLE CHEST - 1 VIEW  COMPARISON:  DG CHEST  1V PORT dated 11/08/2013; DG CHEST 2 VIEW dated 11/06/2013; DG CHEST 1V PORT dated 10/22/2013  FINDINGS: Mild cardiac enlargement status post CABG. Vascular pattern is normal. There is no consolidation, effusion, or pneumothorax.  IMPRESSION: No active disease.   Electronically Signed   By: Skipper Cliche M.D.   On: 11/11/2013 07:03   Dg Chest Portable 1 View  11/08/2013   CLINICAL DATA:  Fever.  Cough.  History of heart disease.  EXAM: PORTABLE CHEST - 1 VIEW  COMPARISON:  DG CHEST 2 VIEW dated 11/06/2013;  CT CHEST W/CM dated 08/31/2013; DG CHEST 2 VIEW dated 07/25/2013  FINDINGS: Prior CABG. Mild cardiomegaly. Indistinct pulmonary vasculature. Emphysema. No overt edema.  IMPRESSION: 1. Cardiomegaly with mild indistinctness of the pulmonary vasculature which could reflect pulmonary venous hypertension. No overt edema. 2. Emphysema.   Electronically Signed   By: Sherryl Barters M.D.   On: 11/08/2013 07:26         Subjective: Patient was agitated than yesterday but remains pleasantly confused. No reports of respiratory distress. No vomiting or diarrhea. Patient denies any headache, chest pain, abdominal pain.  Objective: Filed Vitals:   11/21/13 1000 11/21/13 1100 11/21/13 1200 11/21/13 1300  BP: 158/79 109/71 138/82 87/57  Pulse: 87 110 109   Temp:   101.1 F (38.4 C)   TempSrc:   Axillary   Resp: _0 Height:      Weight:      SpO2: 99% 96% 94% 94%    Intake/Output Summary (Last 24 hours) at 11/21/13 1331 Last data filed at 11/21/13 1300  Gross per 24 hour  Intake 2198.48 ml  Output   2585 ml  Net -386.52 ml   Weight change: 4.881 kg (10 lb 12.2 oz) Exam:   General:  Pt is alert, does not commands appropriately, not in acute distress  HEENT: No icterus, No thrush, [place]/AT  Cardiovascular: RRR, S1/S2, no rubs, no gallops  Respiratory: Bibasilar rales. No wheezing. Good air movement.  Abdomen: Soft/+BS, non tender, non distended, no guarding  Extremities: trace LE edema, No lymphangitis, No petechiae, No rashes, no synovitis  Data Reviewed: Basic Metabolic Panel:  Recent Labs Lab 11/15/13 0510 12/06/2013 0735 11/21/13 0305  NA 140 140 142  K 4.4 4.4 3.5*  CL 106 102 108  CO2 _1 GLUCOSE 94 110* 128*  BUN _2 CREATININE 1.13* 1.17* 1.08  CALCIUM 8.5 8.6 7.6*   Liver Function Tests:  Recent Labs Lab 11/28/2013 0735  AST 38*  ALT 14  ALKPHOS 122*  BILITOT 1.1  PROT 7.8  ALBUMIN 2.9*   No results found for this basename: LIPASE, AMYLASE,   in the last 168 hours No results found for this basename: AMMONIA,  in the last 168 hours CBC:  Recent Labs Lab 11/15/13 0510 11/16/13 0446 11/17/13 1321 12/17/2013 0735 11/20/13 0335 11/21/13 0305  WBC 4.5  --   --  8.8 5.7 4.7  NEUTROABS  --   --   --  1.6*  --   --   HGB 7.9* 7.7* 9.0* 9.1* 7.1* 8.8*  HCT 25.4* 24.1* 28.6* 28.4* 22.9* 27.0*  MCV 96.6  --   --  91.9 93.5 91.5  PLT 42*  --   --  97* 77* 70*   Cardiac Enzymes: No results found for this basename: CKTOTAL, CKMB, CKMBINDEX, TROPONINI,  in the last 168 hours BNP: No components found with this basename: POCBNP,  CBG:  Recent Labs  Lab 12/15/2013 1509  GLUCAP 108*    Recent Results (from the past 240 hour(s))  MRSA PCR SCREENING     Status: None   Collection Time    11/11/13  7:02 PM      Result Value Ref Range Status   MRSA by PCR NEGATIVE  NEGATIVE Final   Comment:            The GeneXpert MRSA Assay (FDA     approved for NASAL specimens     only), is one component of a     comprehensive MRSA colonization     surveillance program. It is not     intended to diagnose MRSA     infection nor to guide or     monitor treatment for     MRSA infections.  CULTURE, EXPECTORATED SPUTUM-ASSESSMENT     Status: None   Collection Time    11/13/13  4:03 PM      Result Value Ref Range Status   Specimen Description SPUTUM   Final   Special Requests Immunocompromised   Final   Sputum evaluation     Final   Value: THIS SPECIMEN IS ACCEPTABLE. RESPIRATORY CULTURE REPORT TO FOLLOW.   Report Status 11/13/2013 FINAL   Final  CULTURE, RESPIRATORY (NON-EXPECTORATED)     Status: None   Collection Time    11/13/13  4:03 PM      Result Value Ref Range Status   Specimen Description SPUTUM   Final   Special Requests NONE   Final   Gram Stain     Final   Value: NO WBC SEEN     NO SQUAMOUS EPITHELIAL CELLS SEEN     NO ORGANISMS SEEN     Performed at [facility]   Culture     Final   Value: RARE FUNGUS (MOLD)  ISOLATED, PROBABLE CONTAMINANT/COLONIZER (SAPROPHYTE). CONTACT MICROBIOLOGY IF FURTHER IDENTIFICATION REQUIRED [id number].     Performed at [facility]   Report Status 11/16/2013 FINAL   Final  CULTURE, BLOOD (ROUTINE X 2)     Status: None   Collection Time    11/14/13  2:50 AM      Result Value Ref Range Status   Specimen Description BLOOD RIGHT ARM   Final   Special Requests BOTTLES DRAWN AEROBIC AND ANAEROBIC 5CC   Final   Culture  Setup Time     Final   Value: 11/14/2013 09:35     Performed at [facility]   Culture     Final   Value: NO GROWTH 5 DAYS     Performed at [facility]   Report Status 11/20/2013 FINAL   Final  CULTURE, BLOOD (ROUTINE X 2)     Status: None   Collection Time    11/14/13  2:55 AM      Result Value Ref Range Status   Specimen Description BLOOD RIGHT HAND   Final   Special Requests BOTTLES DRAWN AEROBIC ONLY 3CC   Final   Culture  Setup Time     Final   Value: 11/14/2013 09:35     Performed at [facility]   Culture     Final   Value: NO GROWTH 5 DAYS     Performed at [facility]   Report Status 11/20/2013 FINAL   Final  CULTURE, BLOOD (ROUTINE X 2)     Status: None   Collection Time    11/29/2013  7:35 AM  Result Value Ref Range Status   Specimen Description BLOOD RIGHT WRIST   Final   Special Requests BOTTLES DRAWN AEROBIC AND ANAEROBIC 4CC [facility]   Final   Culture  Setup Time     Final   Value: 11/18/2013 11:54     Performed at [facility]   Culture     Final   Value:        BLOOD CULTURE RECEIVED NO GROWTH TO DATE CULTURE WILL BE HELD FOR 5 DAYS BEFORE ISSUING A FINAL NEGATIVE REPORT     Performed at [facility]   Report Status PENDING   Incomplete  CULTURE, BLOOD (ROUTINE X 2)     Status: None   Collection Time    12/06/2013  8:05 AM      Result Value Ref Range Status   Specimen Description BLOOD LEFT HAND   Final   Special Requests BOTTLES DRAWN AEROBIC AND ANAEROBIC [facility] EACH    Final   Culture  Setup Time     Final   Value: 11/17/2013 11:54     Performed at [facility]   Culture     Final   Value:        BLOOD CULTURE RECEIVED NO GROWTH TO DATE CULTURE WILL BE HELD FOR 5 DAYS BEFORE ISSUING A FINAL NEGATIVE REPORT     Performed at [facility]   Report Status PENDING   Incomplete  MRSA PCR SCREENING     Status: Abnormal   Collection Time    12/13/2013 11:09 AM      Result Value Ref Range Status   MRSA by PCR POSITIVE (*) NEGATIVE Final   Comment:            The GeneXpert MRSA Assay (FDA     approved for NASAL specimens     only), is one component of a     comprehensive MRSA colonization     surveillance program. It is not     intended to diagnose MRSA     infection nor to guide or     monitor treatment for     MRSA infections.     RESULT CALLED TO, READ BACK BY AND VERIFIED WITH:     Johnnette Litter [id number] @ 1540 BY J SCOTTON  URINE CULTURE     Status: None   Collection Time    11/26/2013  2:34 PM      Result Value Ref Range Status   Specimen Description URINE, CATHETERIZED   Final   Special Requests NONE   Final   Culture  Setup Time     Final   Value: 12/10/2013 17:41     Performed at [facility]     Final   Value: NO GROWTH     Performed at [facility]   Culture     Final   Value: NO GROWTH     Performed at [facility]   Report Status 11/20/2013 FINAL   Final     Scheduled Meds: . Chlorhexidine Gluconate Cloth  6 each Topical Q0600  . ipratropium-albuterol  3 mL Nebulization QID  . metoprolol  5 mg Intravenous 4 times per day  . mupirocin ointment  1 application Nasal BID  . piperacillin-tazobactam (ZOSYN)  IV  3.375 g Intravenous Q8H  . vancomycin  750 mg Intravenous Q12H   Continuous Infusions: . sodium chloride 40 mL/hr at 11/20/13 1117  . dexmedetomidine 0.9 mcg/kg/hr (11/21/13  1300)     Orson Eva, DO  [name] Pager [phone]  If 7PM-7AM, please contact  night-coverage [email or web address] Password [email or web address] 11/21/2013, 1:31 PM   LOS: 2 days

## 2013-11-21 NOTE — Progress Notes (Signed)
Per family do not wake pt up for her neb or chest vest. Sleeping in no distress at this time.

## 2013-11-21 NOTE — Progress Notes (Signed)
Chest vest not done. Pt states that she wants to be left alone and sleep. Husband at bedside.

## 2013-11-21 NOTE — Consult Note (Signed)
[facility] Face-to-Face Psychiatry Consult   Reason for Consult:  Confusion and change in her mental status Referring Physician:  Dr Thurmond Butts Mariner is an 73 y.o. female. Total Time spent with patient: 20 minutes  Assessment: AXIS I:  delirium AXIS II:  Deferred AXIS III:   Past Medical History  Diagnosis Date  . Coronary atherosclerosis of unspecified type of vessel, native or graft   . Atrial fibrillation 01/19/2012    stress test - non-gated secondary to A fib,  normal myocaridal perfusion study  . Unspecified venous (peripheral) insufficiency   . Other and unspecified hyperlipidemia   . Morbid obesity   . Esophageal reflux   . Irritable bowel syndrome   . Other symptoms involving urinary system(788.99)   . Chronic pain syndrome   . 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 Anxiety state, unspecified   . Herpes zoster without mention of complication   . Herpes zoster with other nervous system complications(053.19)   . Lumbago   . Anticoagulated on Coumadin, chronic for A. Fib 04/08/2012  . COPD (chronic obstructive pulmonary disease)   . 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 Pneumonia 4 years ago  . Bronchitis, chronic   . Fibromyalgia   . Arthritis   . CAD (coronary artery disease) 04/29/2006    echo -EF 45-50%, impaired LV relaxation  . 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 GI bleed 04/07/2012    endoscopy/colonoscopy unremarkable  . 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 DJD (degenerative joint disease)   . Myocardial infarction   . Anemia    AXIS IV:  other psychosocial or environmental problems and problems related to social environment AXIS V:  31-40 impairment in reality testing  Plan:  No evidence of imminent risk to self or others at present.   Patient does not meet criteria for psychiatric inpatient admission. Supportive therapy provided about ongoing stressors. Discussed  crisis plan, support from social network, calling 911, coming to the Emergency Department, and calling Suicide Hotline.  Subjective:   Jamel Dunton is a 73 y.o. female patient admitted with confusion.  HPI:  Patient seen chart reviewed.  Patient is 73 year old Caucasian female who has history of multiple health issues admitted because of change in her mental status.  Consult was called because patient has been agitated.  Patient is very confused and unable to provide any information.  I spoke to her husband who was standing next to her bed.  Her husband does the patient has no previous history of psychiatric illness or any inpatient treatment.  He remembered that she gets sick and then she gets very confused.  He remembered this is the fourth time in recent months that she has been admitted and get very confused.  Husband do not remember any psychotropic medication other than Xanax given by her primary care physician for insomnia.  Patient is unable to provide any information.  She does notice mumbling to herself, confused irritable with episodes of agitation.  She was disoriented and did not able to provide any information about her cognition questions.  Husband denies that patient has ever been suicidal or homicidal.  Husband is very involved in her treatment.  Together they have 3 boys they live close by.  Husband endorsed that patient is very involved physically when she is not sick.  She is not confused at home and she is feeling better.  There has  been no history of drug use.   Past Psychiatric History: Past Medical History  Diagnosis Date  . Coronary atherosclerosis of unspecified type of vessel, native or graft   . Atrial fibrillation 01/19/2012    stress test - non-gated secondary to A fib,  normal myocaridal perfusion study  . Unspecified venous (peripheral) insufficiency   . Other and unspecified hyperlipidemia   . Morbid obesity   . Esophageal reflux   . Irritable bowel syndrome   .  Other symptoms involving urinary system(788.99)   . Chronic pain syndrome   . 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 Anxiety state, unspecified   . Herpes zoster without mention of complication   . Herpes zoster with other nervous system complications(053.19)   . Lumbago   . Anticoagulated on Coumadin, chronic for A. Fib 04/08/2012  . COPD (chronic obstructive pulmonary disease)   . 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 Pneumonia 4 years ago  . Bronchitis, chronic   . Fibromyalgia   . Arthritis   . CAD (coronary artery disease) 04/29/2006    echo -EF 45-50%, impaired LV relaxation  . 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 GI bleed 04/07/2012    endoscopy/colonoscopy unremarkable  . 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 DJD (degenerative joint disease)   . Myocardial infarction   . Anemia     reports that she quit smoking about 29 years ago. Her smoking use included Cigarettes. She has a 22 pack-year smoking history. She has never used smokeless tobacco. She reports that she does not drink alcohol or use illicit drugs. Family History  Problem Relation Age of Onset  . Heart disease Mother   . Lung disease Father   . Kidney disease       Living Arrangements: Spouse/significant other;Children   Abuse/Neglect [facility]) Physical Abuse: Denies Verbal Abuse: Denies Sexual Abuse: Denies Allergies:  No Known Allergies  ACT Assessment Complete:  No:   Past Psychiatric History: As per husband patient has no history of psychiatric inpatient treatment or any suicidal attempt.  His primary care has given Xanax for insomnia.  Place of Residence:  Lives with her husband. Marital Status:  Married Employed/Unemployed:  Unemployed Family Supports:  Yes Objective: Blood pressure 150/90, pulse 99, temperature 101.1 F (38.4 C), temperature source Axillary, resp. rate 29, height 5' 7.5" (1.715 m), weight 210 lb  12.2 oz (95.6 kg), SpO2 98.00%.Body mass index is 32.5 kg/(m^2). Results for orders placed during the hospital encounter of 11/18/2013 (from the past 72 hour(s))  CULTURE, BLOOD (ROUTINE X 2)     Status: None   Collection Time    12/15/2013  7:35 AM      Result Value Ref Range   Specimen Description BLOOD RIGHT WRIST     Special Requests BOTTLES DRAWN AEROBIC AND ANAEROBIC 4CC [facility]     Culture  Setup Time       Value: 12/03/2013 11:54     Performed at [facility]   Culture       Value:        BLOOD CULTURE RECEIVED NO GROWTH TO DATE CULTURE WILL BE HELD FOR 5 DAYS BEFORE ISSUING A FINAL NEGATIVE REPORT     Performed at [facility]   Report Status PENDING    CBC WITH DIFFERENTIAL     Status: Abnormal   Collection Time    11/18/2013  7:35 AM  Result Value Ref Range   WBC 8.8  4.0 - 10.5 K/uL   RBC 3.09 (*) 3.87 - 5.11 MIL/uL   Hemoglobin 9.1 (*) 12.0 - 15.0 g/dL   HCT 28.4 (*) 36.0 - 46.0 %   MCV 91.9  78.0 - 100.0 fL   MCH 29.4  26.0 - 34.0 pg   MCHC 32.0  30.0 - 36.0 g/dL   RDW 23.5 (*) 11.5 - 15.5 %   Platelets 97 (*) 150 - 400 K/uL   Comment: SPECIMEN CHECKED FOR CLOTS     REPEATED TO VERIFY     PLATELET COUNT CONFIRMED BY SMEAR   Neutrophils Relative % 18 (*) 43 - 77 %   Lymphocytes Relative 43  12 - 46 %   Monocytes Relative 37 (*) 3 - 12 %   Eosinophils Relative 0  0 - 5 %   Basophils Relative 2 (*) 0 - 1 %   Neutro Abs 1.6 (*) 1.7 - 7.7 K/uL   Lymphs Abs 3.7  0.7 - 4.0 K/uL   Monocytes Absolute 3.3 (*) 0.1 - 1.0 K/uL   Eosinophils Absolute 0.0  0.0 - 0.7 K/uL   Basophils Absolute 0.2 (*) 0.0 - 0.1 K/uL   RBC Morphology RARE NRBCs     WBC Morphology ATYPICAL LYMPHOCYTES     Comment: ATYPICAL MONONUCLEAR CELLS     BLASTS   Smear Review PLATELET COUNT CONFIRMED BY SMEAR    COMPREHENSIVE METABOLIC PANEL     Status: Abnormal   Collection Time    11/27/2013  7:35 AM      Result Value Ref Range   Sodium 140  137 - 147 mEq/L   Potassium 4.4  3.7 - 5.3  mEq/L   Chloride 102  96 - 112 mEq/L   CO2 21  19 - 32 mEq/L   Glucose, Bld 110 (*) 70 - 99 mg/dL   BUN 11  6 - 23 mg/dL   Creatinine, Ser 1.17 (*) 0.50 - 1.10 mg/dL   Calcium 8.6  8.4 - 10.5 mg/dL   Total Protein 7.8  6.0 - 8.3 g/dL   Albumin 2.9 (*) 3.5 - 5.2 g/dL   AST 38 (*) 0 - 37 U/L   Comment: SLIGHT HEMOLYSIS     HEMOLYSIS AT THIS LEVEL MAY AFFECT RESULT   ALT 14  0 - 35 U/L   Alkaline Phosphatase 122 (*) 39 - 117 U/L   Total Bilirubin 1.1  0.3 - 1.2 mg/dL   GFR calc non Af Amer 45 (*) >90 mL/min   GFR calc Af Amer 53 (*) >90 mL/min   Comment: (NOTE)     The eGFR has been calculated using the CKD EPI equation.     This calculation has not been validated in all clinical situations.     eGFR's persistently <90 mL/min signify possible Chronic Kidney     Disease.  PROTIME-INR     Status: Abnormal   Collection Time    11/18/2013  7:35 AM      Result Value Ref Range   Prothrombin Time 35.9 (*) 11.6 - 15.2 seconds   INR 3.78 (*) 0.00 - 1.49  CULTURE, BLOOD (ROUTINE X 2)     Status: None   Collection Time    11/26/2013  8:05 AM      Result Value Ref Range   Specimen Description BLOOD LEFT HAND     Special Requests BOTTLES DRAWN AEROBIC AND ANAEROBIC 3CC EACH     Culture  Setup  Time       Value: 12/03/2013 11:54     Performed at [facility]   Culture       Value:        BLOOD CULTURE RECEIVED NO GROWTH TO DATE CULTURE WILL BE HELD FOR 5 DAYS BEFORE ISSUING A FINAL NEGATIVE REPORT     Performed at [facility]   Report Status PENDING    I-STAT CG4 LACTIC ACID, ED     Status: None   Collection Time    11/17/2013  8:14 AM      Result Value Ref Range   Lactic Acid, Venous 2.07  0.5 - 2.2 mmol/L  BLOOD GAS, VENOUS     Status: Abnormal   Collection Time    11/30/2013  8:40 AM      Result Value Ref Range   O2 Content 2.0     pH, Ven 7.428 (*) 7.250 - 7.300   pCO2, Ven 33.3 (*) 45.0 - 50.0 mmHg   pO2, Ven 44.0  30.0 - 45.0 mmHg   Bicarbonate 21.6  20.0 - 24.0  mEq/L   TCO2 20.5  0 - 100 mmol/L   Acid-base deficit 1.9  0.0 - 2.0 mmol/L   O2 Saturation 75.6     Patient temperature 98.6     Collection site VEIN     Drawn by COLLECTED BY RT     Sample type VENOUS    MRSA PCR SCREENING     Status: Abnormal   Collection Time    12/08/2013 11:09 AM      Result Value Ref Range   MRSA by PCR POSITIVE (*) NEGATIVE   Comment:            The GeneXpert MRSA Assay (FDA     approved for NASAL specimens     only), is one component of a     comprehensive MRSA colonization     surveillance program. It is not     intended to diagnose MRSA     infection nor to guide or     monitor treatment for     MRSA infections.     RESULT CALLED TO, READ BACK BY AND VERIFIED WITH:     Johnnette Litter [id number] @ [phone] BY J SCOTTON  VITAMIN B12     Status: Abnormal   Collection Time    11/18/2013 11:49 AM      Result Value Ref Range   Vitamin B-12 1149 (*) 211 - 911 pg/mL   Comment: Performed at [facility]  TSH     Status: None   Collection Time    12/09/2013 11:49 AM      Result Value Ref Range   TSH 3.310  0.350 - 4.500 uIU/mL   Comment: Please note change in reference range.     Performed at [facility]  T4, FREE     Status: None   Collection Time    11/28/2013 11:49 AM      Result Value Ref Range   Free T4 0.84  0.80 - 1.80 ng/dL   Comment: Performed at [place]     Status: Abnormal   Collection Time    12/16/2013  2:34 PM      Result Value Ref Range   Color, Urine AMBER (*) YELLOW   Comment: BIOCHEMICALS MAY BE AFFECTED BY COLOR   APPearance CLEAR  CLEAR   Specific Gravity, Urine 1.020  1.005 -  1.030   pH 6.0  5.0 - 8.0   Glucose, UA NEGATIVE  NEGATIVE mg/dL   Hgb urine dipstick NEGATIVE  NEGATIVE   Bilirubin Urine SMALL (*) NEGATIVE   Ketones, ur NEGATIVE  NEGATIVE mg/dL   Protein, ur 100 (*) NEGATIVE mg/dL   Urobilinogen, UA 2.0 (*) 0.0 - 1.0 mg/dL   Nitrite NEGATIVE  NEGATIVE    Leukocytes, UA NEGATIVE  NEGATIVE  URINE CULTURE     Status: None   Collection Time    12/17/2013  2:34 PM      Result Value Ref Range   Specimen Description URINE, CATHETERIZED     Special Requests NONE     Culture  Setup Time       Value:  17:41     Performed at [facility]       Value: NO GROWTH     Performed at [facility]   Culture       Value: NO GROWTH     Performed at [facility]   Report Status 11/20/2013 FINAL    URINE MICROSCOPIC-ADD ON     Status: None   Collection Time    12/09/2013  2:34 PM      Result Value Ref Range   Bacteria, UA RARE  RARE  BLOOD GAS, ARTERIAL     Status: Abnormal   Collection Time    11/22/2013  3:00 PM      Result Value Ref Range   O2 Content 2.0     Delivery systems NASAL CANNULA     pH, Arterial 7.384  7.350 - 7.450   pCO2 arterial 35.4  35.0 - 45.0 mmHg   pO2, Arterial 92.2  80.0 - 100.0 mmHg   Bicarbonate 20.7  20.0 - 24.0 mEq/L   TCO2 19.9  0 - 100 mmol/L   Acid-base deficit 3.4 (*) 0.0 - 2.0 mmol/L   O2 Saturation 97.0     Patient temperature 98.6     Collection site RIGHT RADIAL     Drawn by [id number]     Sample type ARTERIAL     Allens test (pass/fail) PASS  PASS  GLUCOSE, CAPILLARY     Status: Abnormal   Collection Time    12/03/2013  3:09 PM      Result Value Ref Range   Glucose-Capillary 108 (*) 70 - 99 mg/dL   Comment 1 Documented in Chart     Comment 2 Notify RN    CORTISOL     Status: None   Collection Time    11/25/2013  3:45 PM      Result Value Ref Range   Cortisol, Plasma 9.1     Comment: (NOTE)     AM:  4.3 - 22.4 ug/dL     PM:  3.1 - 16.7 ug/dL     Performed at [facility]  PROCALCITONIN     Status: None   Collection Time    12/12/2013  3:45 PM      Result Value Ref Range   Procalcitonin 4.26     Comment:            Interpretation:     PCT > 2 ng/mL:     Systemic infection (sepsis) is likely,     unless other causes are known.     (NOTE)              ICU PCT Algorithm  Non ICU PCT Algorithm        ----------------------------     ------------------------------             PCT < 0.25 ng/mL                 PCT < 0.1 ng/mL         Stopping of antibiotics            Stopping of antibiotics           strongly encouraged.               strongly encouraged.        ----------------------------     ------------------------------           PCT level decrease by               PCT < 0.25 ng/mL           >= 80% from peak PCT           OR PCT 0.25 - 0.5 ng/mL          Stopping of antibiotics                                                 encouraged.         Stopping of antibiotics               encouraged.        ----------------------------     ------------------------------           PCT level decrease by              PCT >= 0.25 ng/mL           < 80% from peak PCT            AND PCT >= 0.5 ng/mL            Continuing antibiotics                                                  encouraged.           Continuing antibiotics                encouraged.        ----------------------------     ------------------------------         PCT level increase compared          PCT > 0.5 ng/mL             with peak PCT AND              PCT >= 0.5 ng/mL             Escalation of antibiotics                                              strongly encouraged.          Escalation of antibiotics            strongly encouraged.  PREPARE FRESH FROZEN PLASMA  Status: None   Collection Time    12/08/2013  6:30 PM      Result Value Ref Range   Unit Number [id number]     Blood Component Type THAWED PLASMA     Unit division 00     Status of Unit ISSUED,FINAL     Transfusion Status OK TO TRANSFUSE     Unit Number [id number]     Blood Component Type THAWED PLASMA     Unit division 00     Status of Unit ISSUED,FINAL     Transfusion Status OK TO TRANSFUSE    PROTIME-INR     Status: Abnormal   Collection Time    11/20/13  3:35 AM      Result  Value Ref Range   Prothrombin Time 19.2 (*) 11.6 - 15.2 seconds   INR 1.67 (*) 0.00 - 1.49  CBC     Status: Abnormal   Collection Time    11/20/13  3:35 AM      Result Value Ref Range   WBC 5.7  4.0 - 10.5 K/uL   Comment: RARE NRBCs   RBC 2.45 (*) 3.87 - 5.11 MIL/uL   Hemoglobin 7.1 (*) 12.0 - 15.0 g/dL   Comment: DELTA CHECK NOTED     REPEATED TO VERIFY   HCT 22.9 (*) 36.0 - 46.0 %   MCV 93.5  78.0 - 100.0 fL   MCH 29.0  26.0 - 34.0 pg   MCHC 31.0  30.0 - 36.0 g/dL   RDW 23.6 (*) 11.5 - 15.5 %   Platelets 77 (*) 150 - 400 K/uL   Comment: REPEATED TO VERIFY     SPECIMEN CHECKED FOR CLOTS     PLATELET COUNT CONFIRMED BY SMEAR  PROCALCITONIN     Status: None   Collection Time    11/20/13  3:35 AM      Result Value Ref Range   Procalcitonin 3.67     Comment:            Interpretation:     PCT > 2 ng/mL:     Systemic infection (sepsis) is likely,     unless other causes are known.     (NOTE)             ICU PCT Algorithm               Non ICU PCT Algorithm        ----------------------------     ------------------------------             PCT < 0.25 ng/mL                 PCT < 0.1 ng/mL         Stopping of antibiotics            Stopping of antibiotics           strongly encouraged.               strongly encouraged.        ----------------------------     ------------------------------           PCT level decrease by               PCT < 0.25 ng/mL           >= 80% from peak PCT           OR PCT 0.25 - 0.5 ng/mL          Stopping  of antibiotics                                                 encouraged.         Stopping of antibiotics               encouraged.        ----------------------------     ------------------------------           PCT level decrease by              PCT >= 0.25 ng/mL           < 80% from peak PCT            AND PCT >= 0.5 ng/mL            Continuing antibiotics                                                  encouraged.           Continuing  antibiotics                encouraged.        ----------------------------     ------------------------------         PCT level increase compared          PCT > 0.5 ng/mL             with peak PCT AND              PCT >= 0.5 ng/mL             Escalation of antibiotics                                              strongly encouraged.          Escalation of antibiotics            strongly encouraged.  PREPARE RBC (CROSSMATCH)     Status: None   Collection Time    11/20/13  9:00 AM      Result Value Ref Range   Order Confirmation ORDER PROCESSED BY BLOOD BANK    TYPE AND SCREEN     Status: None   Collection Time    11/20/13  9:40 AM      Result Value Ref Range   ABO/RH(D) O NEG     Antibody Screen NEG     Sample Expiration 21-Dec-2013     Unit Number [id number]     Blood Component Type RED CELLS,LR     Unit division 00     Status of Unit ISSUED,FINAL     Transfusion Status OK TO TRANSFUSE     Crossmatch Result COMPATIBLE     Donor AG Type NEGATIVE FOR KELL ANTIGEN     Unit Number [id number]     Blood Component Type RED CELLS,LR     Unit division 00     Status of Unit ALLOCATED     Transfusion Status OK TO TRANSFUSE     Crossmatch Result COMPATIBLE  Donor AG Type NEGATIVE FOR KELL ANTIGEN    OCCULT BLOOD X 1 CARD TO LAB, STOOL     Status: None   Collection Time    11/20/13  2:56 PM      Result Value Ref Range   Fecal Occult Bld NEGATIVE  NEGATIVE  PROTIME-INR     Status: Abnormal   Collection Time    11/21/13  3:05 AM      Result Value Ref Range   Prothrombin Time 15.4 (*) 11.6 - 15.2 seconds   INR 1.25  0.00 - 1.49  CBC     Status: Abnormal   Collection Time    11/21/13  3:05 AM      Result Value Ref Range   WBC 4.7  4.0 - 10.5 K/uL   RBC 2.95 (*) 3.87 - 5.11 MIL/uL   Hemoglobin 8.8 (*) 12.0 - 15.0 g/dL   Comment: DELTA CHECK NOTED     REPEATED TO VERIFY     POST TRANSFUSION SPECIMEN   HCT 27.0 (*) 36.0 - 46.0 %   MCV 91.5  78.0 - 100.0 fL   MCH 29.8   26.0 - 34.0 pg   MCHC 32.6  30.0 - 36.0 g/dL   RDW 22.8 (*) 11.5 - 15.5 %   Platelets 70 (*) 150 - 400 K/uL   Comment: REPEATED TO VERIFY     PLATELET COUNT CONFIRMED BY SMEAR  PROCALCITONIN     Status: None   Collection Time    11/21/13  3:05 AM      Result Value Ref Range   Procalcitonin 3.18     Comment:            Interpretation:     PCT > 2 ng/mL:     Systemic infection (sepsis) is likely,     unless other causes are known.     (NOTE)             ICU PCT Algorithm               Non ICU PCT Algorithm        ----------------------------     ------------------------------             PCT < 0.25 ng/mL                 PCT < 0.1 ng/mL         Stopping of antibiotics            Stopping of antibiotics           strongly encouraged.               strongly encouraged.        ----------------------------     ------------------------------           PCT level decrease by               PCT < 0.25 ng/mL           >= 80% from peak PCT           OR PCT 0.25 - 0.5 ng/mL          Stopping of antibiotics                                                 encouraged.  Stopping of antibiotics               encouraged.        ----------------------------     ------------------------------           PCT level decrease by              PCT >= 0.25 ng/mL           < 80% from peak PCT            AND PCT >= 0.5 ng/mL            Continuing antibiotics                                                  encouraged.           Continuing antibiotics                encouraged.        ----------------------------     ------------------------------         PCT level increase compared          PCT > 0.5 ng/mL             with peak PCT AND              PCT >= 0.5 ng/mL             Escalation of antibiotics                                              strongly encouraged.          Escalation of antibiotics            strongly encouraged.  BASIC METABOLIC PANEL     Status: Abnormal   Collection Time     11/21/13  3:05 AM      Result Value Ref Range   Sodium 142  137 - 147 mEq/L   Potassium 3.5 (*) 3.7 - 5.3 mEq/L   Comment: DELTA CHECK NOTED     REPEATED TO VERIFY   Chloride 108  96 - 112 mEq/L   CO2 20  19 - 32 mEq/L   Glucose, Bld 128 (*) 70 - 99 mg/dL   BUN 9  6 - 23 mg/dL   Creatinine, Ser 1.08  0.50 - 1.10 mg/dL   Calcium 7.6 (*) 8.4 - 10.5 mg/dL   GFR calc non Af Amer 50 (*) >90 mL/min   GFR calc Af Amer 58 (*) >90 mL/min   Comment: (NOTE)     The eGFR has been calculated using the CKD EPI equation.     This calculation has not been validated in all clinical situations.     eGFR's persistently <90 mL/min signify possible Chronic Kidney     Disease.   Labs are reviewed.  Current Facility-Administered Medications  Medication Dose Route Frequency Provider Last Rate Last Dose  . 0.9 %  sodium chloride infusion   Intravenous Continuous Donita Brooks, NP 40 mL/hr at 11/20/13 1117    . acetaminophen (TYLENOL) suppository 650 mg  650 mg Rectal Q6H PRN Rigoberto Noel, MD   650 mg at 11/21/13 1217  . albuterol (PROVENTIL) (  2.5 MG/3ML) 0.083% nebulizer solution 2.5 mg  2.5 mg Nebulization Q2H PRN Domenic Polite, MD      . Chlorhexidine Gluconate Cloth 2 % PADS 6 each  6 each Topical Q0600 Orson Eva, MD      . dexmedetomidine (PRECEDEX) 400 MCG/100ML infusion  0.4-1.2 mcg/kg/hr Intravenous Titrated Orson Eva, MD 22.2 mL/hr at 11/21/13 1500 0.9 mcg/kg/hr at 11/21/13 1500  . haloperidol lactate (HALDOL) injection 1-4 mg  1-4 mg Intravenous Q3H PRN Rigoberto Noel, MD   4 mg at 11/20/13 1524  . ipratropium-albuterol (DUONEB) 0.5-2.5 (3) MG/3ML nebulizer solution 3 mL  3 mL Nebulization QID Domenic Polite, MD   3 mL at 11/21/13 0813  . LORazepam (ATIVAN) injection 0.5-1 mg  0.5-1 mg Intravenous Q4H PRN Donita Brooks, NP      . metoprolol (LOPRESSOR) injection 2.5-5 mg  2.5-5 mg Intravenous Q3H PRN Rigoberto Noel, MD   5 mg at 12/07/2013 2325  . metoprolol (LOPRESSOR) injection 5 mg  5 mg  Intravenous 4 times per day Donita Brooks, NP      . mupirocin ointment (BACTROBAN) 2 % 1 application  1 application Nasal BID Orson Eva, MD   1 application at 67/20/94 1007  . ondansetron (ZOFRAN) injection 4 mg  4 mg Intravenous Q6H PRN Domenic Polite, MD      . piperacillin-tazobactam (ZOSYN) IVPB 3.375 g  3.375 g Intravenous [address] [place], RPH   3.375 g at 11/21/13 7096  . vancomycin (VANCOCIN) IVPB 750 mg/150 ml premix  750 mg Intravenous Q12H Randa Spike, RPH   750 mg at 11/21/13 1138    Psychiatric Specialty Exam:     Blood pressure 150/90, pulse 99, temperature 101.1 F (38.4 C), temperature source Axillary, resp. rate 29, height 5' 7.5" (1.715 m), weight 210 lb 12.2 oz (95.6 kg), SpO2 98.00%.Body mass index is 32.5 kg/(m^2).  General Appearance: Confused and disoriented  Eye Contact::  None  Speech:  Garbled  Volume:  Increased  Mood:  Irritable  Affect:  Labile  Thought Process:  Loose and Tangential  Orientation:  Other:  poor  Thought Content:  Disorientation  Suicidal Thoughts:  No  Homicidal Thoughts:  No  Memory:  Remote;   Poor  Judgement:  Impaired  Insight:  Lacking  Psychomotor Activity:  Increased  Concentration:  Poor  Recall:  Poor  Fund of Knowledge:Poor  Language: Poor  Akathisia:  No  Handed:  Right  AIMS (if indicated):     Assets:  Housing  Sleep:      Musculoskeletal: Strength & Muscle Tone: Unable to assess Gait & Station: Patient is lying on the bed Patient leans: N/A  Treatment Plan Summary: Medication management, most likely Delirium.  Treat agitation with Haldol if not medically contraindicated since patient cannot take by mouth Zyprexa or any others antipsychotic medication.  Patient can also be given Ativan to help withdrawal symptoms of Xanax.  Treat underlying cause for delirium.  Patient does not require inpatient psychiatric services.  Please call [phone] if you have any further questions.  Dossie Der T Naif Alabi 11/21/2013  4:08 PM

## 2013-11-21 NOTE — Progress Notes (Signed)
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 Pna/tme and pulm/ccm initially asked to see for ? Why recurrent pna but on arrival noted pt obtunded (around 2pm p foley place) and converted to CCM consult.   SIGNIFICANT EVENTS / STUDIES:  5/03 - CT chest >>extensive bilateral multifocal PNA, UL predominant GGO, focal masslike consolidation in LLL, R anterior/medial ML, mediastinal / hilar adenopathy, small bilateral pleural effusions, secretions filling the distal trachea, carina extending into the left mainstem bronchus 5/03 - CT Head >> no acute abnormality Procalcitonin protocol 5/3 >>4.26 -->3.67 Cortisol level 5/3 >>9.1  LINES / TUBES:   CULTURES: BC x 2 5/3 >>> MRSA Screen 5/3 > POS  UC 5/3 >>>neg UA 5/3 >>>neg   ANTIBIOTICS: Zosyn 5/3 >>> Vanc IV 5/3 >>>   SUBJECTIVE: Significant agitation overnight, back to precedex.    VITAL SIGNS: Temp:  [97 F (36.1 C)-102 F (38.9 C)] 98.5 F (36.9 C) (05/05 0800) Pulse Rate:  [69-164] 87 (05/05 1000) Resp:  [22-31] 24 (05/05 1000) BP: (116-161)/(52-101) 158/79 mmHg (05/05 1000) SpO2:  [92 %-100 %] 99 % (05/05 1000) Weight:  [210 lb 12.2 oz (95.6 kg)] 210 lb 12.2 oz (95.6 kg) (05/05 0500)  INTAKE / OUTPUT: Intake/Output     05/04 0701 - 05/05 0700 05/05 0701 - 05/06 0700   P.O.     I.V. (mL/kg) 1380.8 (14.4) 251 (2.6)   Blood 412.5    Other 400    IV Piggyback 550 37.5   Total Intake(mL/kg) 2743.3 (28.7) 288.5 (3)   Urine (mL/kg/hr) 2760 (1.2)  100 (0.2)   Total Output 2760 100   Net -16.7 +188.5        Stool Occurrence 1 x      PHYSICAL EXAMINATION: General: morbidly obese female, intermittent agitation Oropharanx clear/ dentures in place Neck supple Lungs with a few scattered exp > insp rhonchi bilaterally IRIR  no s3 or or sign murmur Abd obese with excursion limited Extr wam with no edema or clubbing noted Neuro  No motor deficits, combative with staff at times  LABS:  CBC  Recent Labs Lab 11/30/2013 0735 11/20/13 0335 11/21/13 0305  WBC 8.8 5.7 4.7  HGB 9.1* 7.1* 8.8*  HCT 28.4* 22.9* 27.0*  PLT 97* 77* 70*   Coag's  Recent Labs Lab 11/22/2013 0735 11/20/13 0335 11/21/13 0305  INR 3.78* 1.67* 1.25   BMET  Recent Labs Lab 11/15/13 0510 11/20/2013 0735 11/21/13 0305  NA 140 140 142  K 4.4 4.4 3.5*  CL 106 102 108  CO2 23 21 20   BUN 13 11 9   CREATININE 1.13* 1.17* 1.08  GLUCOSE 94 110* 128*   Electrolytes  Recent Labs Lab 11/15/13 0510 12/15/2013 0735 11/21/13 0305  CALCIUM 8.5 8.6 7.6*   Sepsis Markers  Recent Labs Lab 12/13/2013 0814 12/11/2013 1545 11/20/13 0335 11/21/13 0305  LATICACIDVEN 2.07  --   --   --   PROCALCITON  --  4.26 3.67 3.18  ABG  Recent Labs Lab 12/14/2013 1500  PHART 7.384  PCO2ART 35.4  PO2ART 92.2   Liver Enzymes  Recent Labs Lab 12/08/2013 0735  AST 38*  ALT 14  ALKPHOS 122*  BILITOT 1.1  ALBUMIN 2.9*   Glucose  Recent Labs Lab 12/16/2013 1509  GLUCAP 108*    Imaging Ct Head Wo Contrast  12/01/2013   CLINICAL DATA:  Altered mental status  EXAM: CT HEAD WITHOUT CONTRAST  TECHNIQUE: Contiguous axial images were obtained from the base of the skull through the vertex without intravenous contrast.  COMPARISON:  11/12/2011  FINDINGS: Ventricles are normal in configuration there is ventricular and sulcal enlargement reflecting atrophy. No hydrocephalus.  No parenchymal masses or mass effect there is no evidence of a cortical infarct.  There are no  extra-axial masses or abnormal fluid collections.  No intracranial hemorrhage.  Visualized sinuses and mastoid air cells are clear.  IMPRESSION: 1. No acute intracranial abnormalities.  No intracranial hemorrhage. 2. Stable atrophy.   Electronically Signed   By: Lajean Manes M.D.   On: 11/18/2013 18:05   Ct Chest Wo Contrast  12/05/2013   ADDENDUM REPORT: 12/11/2013 18:00  ADDENDUM: Not mentioned in the original report. There are secretions partly filling the distal trachea at the carina extending into the left mainstem bronchus.   Electronically Signed   By: Lajean Manes M.D.   On: 12/17/2013 18:00   11/18/2013   CLINICAL DATA:  Hypoxia.  Evaluate for recurrent pneumonia.  EXAM: CT CHEST WITHOUT CONTRAST  TECHNIQUE: Multidetector CT imaging of the chest was performed following the standard protocol without IV contrast.  COMPARISON:  Current chest radiograph.  Chest CT, 08/31/2013.  FINDINGS: There is patchy ground-glass opacity that predominates in the upper lobes. There is focal, masslike, confluent opacity in several locations, most prominently in the left lower lobe and in the anteromedial right middle lobe at the lung base. There are minimal right and small left pleural effusions. Underlying emphysema is noted in the upper lobes.  Heart is mildly enlarged. There are dense coronary artery calcifications  Mediastinal adenopathy is noted. Reference measurements were made of several nodes There is a precarinal lymph node measuring 16 mm in short axis. There is a prevascular node measuring 16 mm short axis. A number prevascular node adjacent to the AP window measures 11 mm in short axis. A subcarinal node measures 15 mm short axis. Bile hilar adenopathy is noted not well-defined due to lack of contrast. Adenopathy has mildly increased when compared to the prior study.  Limited evaluation of the upper abdomen is unremarkable.  No osteoblastic or osteolytic lesions. There is a mild compression fracture of the  upper endplate of T4. This is stable.  IMPRESSION: 1. Findings are consistent with extensive, bilateral, multifocal pneumonia. There are patchy areas of upper lobe predominant ground-glass opacity. There also focal masslike areas of consolidation predominating in the left lower lobe and right anterior medial middle lobe. There is associated mediastinal and hilar adenopathy. Minimal right and small left effusions.  Electronically Signed: By: Lajean Manes M.D. On: 11/20/2013 17:41    ASSESSMENT / PLAN:  PULMONARY A   Acute hypoxemic resp failure  Bilateral Infiltrates - multifocal PNA, +/- component edema  HCAP - recent admit with CAP, readmit with AMS + previous CAP COPD P:   Continue advair  Pulmonary hygiene:  Chest PT & flutter Assess pcxr in am  Complete Rx 7-10 days total abx Lasix dosing as below  CARDIOVASCULAR A:  Hypotension - in setting of bilateral infiltrates +/- narcotic influence.  Resolved.  Chronic AFib - on anticoagulation with slowed ventricular resp on BB CAD PVD P:   Continue to hold oral imdur, zocor Change lopressor to IV as pt not taking oral meds reliably.   PRN lopressor with parameters Reduce IVF to kvo Lasix 40 mg x1 5/5  RENAL A:  Oliguric - responded to volume.  Resolved.  P.  -monitor UOP  GASTROINTESTINAL A:  Morbid Obesity Hx of GIB - remote, husband reports dark stool P: -allow ice chips  HEMATOLOGIC A:  Anemia - noted drop, reports of dark stool on anti-coagulation + ASA DS and plasma cell dyscrasia  Chronic Anti-Coagulation  P:  Continue Iron Hold Coumadin SCD's  Check FOB >>  INFECTIOUS A:   Possible sepsis / HCAP P:   Agree with z/v per dashboard PCT protocol  ENDOCRINE A:   Mild Hyperglycemia P:   Cortisol wnl  NEUROLOGIC A:   AMS c/w TME - resolved 5/4  Chronic Pain Syndrome Anxiety  Fibromyalgia P:   Hold neurontin, nortiptyline, percocet, zanaflex, xanax  Cautious restart of above, consider resuming  one at a time.  Or not at all.  CT head neg PSY evaluation for recommendations regarding medications to assist with agitated delirium.  Appears to have sun-downing type symptoms.  Husband reports patient was a functional person 3 months ago. Has had issues with agitation, not sleeping since last admission.  Was discharged with the hopes that sleep issues would resolve but unfortunately, they worsened and family returned with patient.  Query some level of undiagnosed dementia at baseline exacerbated by acute critical illness.    Noe Gens, NP-[facility] Pgr: [phone] or [phone] 11/21/2013, 11:16 AM  Baltazar Apo, MD, PhD 11/21/2013, 12:20 PM [facility] [phone] or if no answer [phone]

## 2013-11-21 NOTE — Progress Notes (Signed)
Neb tx and chest vest not done. Pt taking mask and vest off when rt tries to do her therapy.. Pt appears to have some AMS. Husband at bedside.

## 2013-11-22 ENCOUNTER — Inpatient Hospital Stay (HOSPITAL_COMMUNITY): Payer: Medicare Other

## 2013-11-22 DIAGNOSIS — D4620 Refractory anemia with excess of blasts, unspecified: Secondary | ICD-10-CM

## 2013-11-22 DIAGNOSIS — A403 Sepsis due to Streptococcus pneumoniae: Secondary | ICD-10-CM

## 2013-11-22 DIAGNOSIS — J449 Chronic obstructive pulmonary disease, unspecified: Secondary | ICD-10-CM

## 2013-11-22 DIAGNOSIS — E8809 Other disorders of plasma-protein metabolism, not elsewhere classified: Secondary | ICD-10-CM

## 2013-11-22 LAB — BASIC METABOLIC PANEL
BUN: 11 mg/dL (ref 6–23)
CALCIUM: 7.9 mg/dL — AB (ref 8.4–10.5)
CO2: 21 meq/L (ref 19–32)
Chloride: 110 mEq/L (ref 96–112)
Creatinine, Ser: 1.05 mg/dL (ref 0.50–1.10)
GFR calc Af Amer: 60 mL/min — ABNORMAL LOW (ref >90)
GFR calc non Af Amer: 52 mL/min — ABNORMAL LOW (ref >90)
Glucose, Bld: 135 mg/dL — ABNORMAL HIGH (ref 70–99)
POTASSIUM: 3.6 meq/L — AB (ref 3.7–5.3)
SODIUM: 145 meq/L (ref 137–147)

## 2013-11-22 LAB — URINE CULTURE
CULTURE: NO GROWTH
Colony Count: NO GROWTH

## 2013-11-22 LAB — CBC
HCT: 27.2 % — ABNORMAL LOW (ref 36.0–46.0)
HEMOGLOBIN: 8.4 g/dL — AB (ref 12.0–15.0)
MCH: 29 pg (ref 26.0–34.0)
MCHC: 30.9 g/dL (ref 30.0–36.0)
MCV: 93.8 fL (ref 78.0–100.0)
Platelets: 61 10*3/uL — ABNORMAL LOW (ref 150–400)
RBC: 2.9 MIL/uL — AB (ref 3.87–5.11)
RDW: 23 % — ABNORMAL HIGH (ref 11.5–15.5)
WBC: 5.2 10*3/uL (ref 4.0–10.5)

## 2013-11-22 LAB — PROTIME-INR
INR: 1.31 (ref 0.00–1.49)
PROTHROMBIN TIME: 16 s — AB (ref 11.6–15.2)

## 2013-11-22 LAB — PRO B NATRIURETIC PEPTIDE: Pro B Natriuretic peptide (BNP): 22932 pg/mL — ABNORMAL HIGH (ref 0–125)

## 2013-11-22 MED ORDER — FUROSEMIDE 10 MG/ML IJ SOLN
40.0000 mg | Freq: Once | INTRAMUSCULAR | Status: AC
  Administered 2013-11-22: 40 mg via INTRAVENOUS
  Filled 2013-11-22: qty 4

## 2013-11-22 MED ORDER — KETOROLAC TROMETHAMINE 15 MG/ML IJ SOLN: 7.5000 mg | Freq: Once | INTRAMUSCULAR | Status: DC

## 2013-11-22 MED ORDER — MORPHINE SULFATE 2 MG/ML IJ SOLN
2.0000 mg | INTRAMUSCULAR | Status: DC | PRN
  Administered 2013-11-22 (×2): 2 mg via INTRAVENOUS
  Filled 2013-11-22 (×2): qty 1

## 2013-11-22 MED ORDER — MORPHINE SULFATE 10 MG/ML IJ SOLN
1.0000 mg/h | INTRAMUSCULAR | Status: DC
  Administered 2013-11-22: 2 mg/h via INTRAVENOUS
  Administered 2013-11-23 (×2): 10 mg/h via INTRAVENOUS
  Filled 2013-11-22 (×3): qty 10

## 2013-11-22 MED ORDER — ACETAMINOPHEN 10 MG/ML IV SOLN
1000.0000 mg | Freq: Once | INTRAVENOUS | Status: AC
  Administered 2013-11-22: 1000 mg via INTRAVENOUS
  Filled 2013-11-22: qty 100

## 2013-11-22 MED ORDER — IPRATROPIUM-ALBUTEROL 0.5-2.5 (3) MG/3ML IN SOLN: 3.0000 mL | RESPIRATORY_TRACT | Status: DC | PRN

## 2013-11-22 NOTE — Progress Notes (Signed)
CSW received referral for New SNF.   CSW reviewed chart and noted that MD ordered PMT [place] consult today.  CSW to await PMT GOC recommendations and complete full psychosocial at that time.  Alison Murray, MSW, [place] [phone]

## 2013-11-22 NOTE — Progress Notes (Signed)
Nutrition Brief Note  Patient identified on [place] Report Chart reviewed. Pt now transitioning to comfort care.  No further nutrition interventions warranted at this time.  Please re-consult as needed.   Atlee Abide MS RD LDN Clinical Dietitian [phone]

## 2013-11-22 NOTE — Progress Notes (Signed)
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 Pna/tme and pulm/ccm initially asked to see for ? Why recurrent pna but on arrival noted pt obtunded (around 2pm p foley place) and converted to CCM consult.   SIGNIFICANT EVENTS / STUDIES:  5/03 - CT chest >>extensive bilateral multifocal PNA, UL predominant GGO, focal masslike consolidation in LLL, R anterior/medial ML, mediastinal / hilar adenopathy, small bilateral pleural effusions, secretions filling the distal trachea, carina extending into the left mainstem bronchus 5/03 - CT Head >> no acute abnormality Procalcitonin protocol 5/3 >>4.26 -->3.67 Cortisol level 5/3 >>9. Precedex started 5/4  LINES / TUBES:   CULTURES: BC x 2 5/3 >>> MRSA Screen 5/3 > POS  UC 5/3 >>>neg UA 5/3 >>>neg   ANTIBIOTICS: Zosyn 5/3 >>> 5/6 Vanc IV 5/3 >>> 5/6   SUBJECTIVE:  On precedex, not protecting airway Family understanding and accepting that she is not going to get better from this illness.   VITAL SIGNS: Temp:  [99.6 F (37.6 C)-101.1 F (38.4 C)] 99.9 F (37.7 C) (05/06 0800) Pulse Rate:  [61-115] 75 (05/06 1000) Resp:  [19-30] 22 (05/06 1000) BP: (87-173)/(57-95) 168/82 mmHg (05/06 1000) SpO2:  [92 %-100 %] 100 % (05/06 1000) Weight:  [96.8 kg (213 lb 6.5 oz)] 96.8 kg (213 lb 6.5 oz) (05/06 0400)  INTAKE / OUTPUT: Intake/Output     05/05 0701 - 05/06 0700 05/06 0701 - 05/07 0700   I.V. (mL/kg) 1645.4 (17) 179.2 (1.9)   Blood     Other     IV Piggyback  450 50   Total Intake(mL/kg) 2095.4 (21.6) 229.2 (2.4)   Urine (mL/kg/hr) 750 (0.3) 110 (0.3)   Total Output 750 110   Net +1345.4 +119.2          PHYSICAL EXAMINATION: General: morbidly obese female, sedated Oropharanx UA secretions, unable to clear Neck supple Lungs B rhonchi throughout IRIR  no s3 or or sign murmur Abd obese with excursion limited Extr wam with no edema or clubbing noted Neuro  Sedated, incomprehensible words with stim, moves both UE  LABS:  CBC  Recent Labs Lab 11/20/13 0335 11/21/13 0305 11/22/13 0300  WBC 5.7 4.7 5.2  HGB 7.1* 8.8* 8.4*  HCT 22.9* 27.0* 27.2*  PLT 77* 70* 61*   Coag's  Recent Labs Lab 11/20/13 0335 11/21/13 0305 11/22/13 0300  INR 1.67* 1.25 1.31   BMET  Recent Labs Lab 2013/12/11 0735 11/21/13 0305 11/22/13 0300  NA 140 142 145  K 4.4 3.5* 3.6*  CL 102 108 110  CO2 21 20 21   BUN 11 9 11   CREATININE 1.17* 1.08 1.05  GLUCOSE 110* 128* 135*   Electrolytes  Recent Labs Lab Dec 11, 2013 0735 11/21/13 0305 11/22/13 0300  CALCIUM 8.6 7.6* 7.9*   Sepsis Markers  Recent Labs Lab Dec 11, 2013 0814 2013-12-11 1545 11/20/13 0335 11/21/13 0305  LATICACIDVEN 2.07  --   --   --   PROCALCITON  --  4.26 3.67 3.18  ABG  Recent Labs Lab 11/25/2013 1500  PHART 7.384  PCO2ART 35.4  PO2ART 92.2   Liver Enzymes  Recent Labs Lab 12/06/2013 0735  AST 38*  ALT 14  ALKPHOS 122*  BILITOT 1.1  ALBUMIN 2.9*   Glucose  Recent Labs Lab 12/12/2013 1509  GLUCAP 108*    Imaging Dg Chest Port 1 View  11/22/2013   CLINICAL DATA:  Assess airspace disease  EXAM: PORTABLE CHEST - 1 VIEW  COMPARISON:  11/26/2013  FINDINGS: Diffuse increase in interstitial markings. Patchy bilateral consolidation as seen on previous chest CT. These densities have increased at the bases. No increasing pleural fluid. No pneumothorax. Stable cardiomegaly, status post CABG.  IMPRESSION: Worsening aeration, likely a combination of worsening edema and  pneumonia.   Electronically Signed   By: Jorje Guild M.D.   On: 11/22/2013 05:57    ASSESSMENT / PLAN:  PULMONARY A   Acute hypoxemic resp failure, stable Bilateral Infiltrates - multifocal PNA, +/- component edema  HCAP - recent admit with CAP, readmit with AMS + presumed HCAP COPD P:   At this point she cannot protect airway. She is a poor candidate for MV and her wishes are DNR. Discussed with family today that she is dying, likely driven by the debilitation of her malignancy. We will transition precedex to morphine with goal that she will still be able to interact but will be comfortable. I will stop all non-comfort meds including abx for possible HCAP, will notify Dr Tat of our conversation and plans.   CARDIOVASCULAR A:  Hypotension - in setting of bilateral infiltrates +/- narcotic influence.  Resolved.  Chronic AFib - on anticoagulation with slowed ventricular resp on BB CAD PVD P:   Plan to simplify meds, d/c all non-comfort meds  RENAL A:  Oliguric - responded to volume.  Resolved.  P.  -monitor UOP  GASTROINTESTINAL A:  Morbid Obesity Hx of GIB - remote, husband reports dark stool P: -allow ice chips or comfort PO intake  HEMATOLOGIC A:  Anemia - noted drop, reports of dark stool on anti-coagulation + ASA DS and plasma cell dyscrasia  Chronic Anti-Coagulation  P:    INFECTIOUS A:   Possible sepsis / HCAP P:   D/c abx on 5/6  ENDOCRINE A:   Mild Hyperglycemia P:     NEUROLOGIC A:   AMS c/w TME - etiology unclear but suspect cyclical delirium and progression due to her malignancy and overall metabolic state.  Chronic Pain Syndrome Anxiety  Fibromyalgia P:   Held neurontin, nortiptyline, percocet, zanaflex, xanax  Will d/c psych eval - she would not be able to participate.    PCCM will sign off. Anticipate transfer to a floor Palliative bed once morphine transition has been made. Please call us if we can assist  CC time 30  minutes  Baltazar Apo, MD, PhD 11/22/2013, 11:05 AM [facility] [phone] or if no answer [phone]

## 2013-11-22 NOTE — Progress Notes (Signed)
Significant increase in HR noted.  MD notified. Family called and updated.  Will continue to monitor.

## 2013-11-22 NOTE — Consult Note (Signed)
[place]  [phone]   Requesting Provider: [name]  Consulting Provider: Dr. Julien Nordmann  Primary Oncologist: Dr. Julien Nordmann.  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 8, and had hypoxemia with O2 sats in the 80s. She also was hypotensive requiring IV  Fluid resuscitation.  Chest x-ray found her to have pneumonia, confirmed by CT chest, which showed  bilateral, multifocal areas of disease. She was initiated on Zosyn and vancomycin IV. MRSA screen was positive. On presentation, she was also obtunded, diagnosed with delirium by Psychiatry, ameliorated with Haldol and Ativan. According to husband, as patient cannot verbalize symptoms due to sedation, she is comfortable. No chest pain at this time.Shortness of breath continues with worsening aeration per chest x ray. 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  2) plasma cell dyscrasia.   PRIOR THERAPY: None   CURRENT THERAPY: Revlimid 10 mg by mouth daily for 21 days every 4 weeks.    Past Medical History  Diagnosis Date  . Coronary atherosclerosis of unspecified type of vessel, native or graft   . Atrial fibrillation 01/19/2012    stress test - non-gated secondary to A fib,  normal myocaridal perfusion study  . Unspecified venous  (peripheral) insufficiency   . Other and unspecified hyperlipidemia   . Morbid obesity   . Esophageal reflux   . Irritable bowel syndrome   . Other symptoms involving urinary system(788.99)   . Chronic pain syndrome   . 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 Anxiety state, unspecified   . Herpes zoster without mention of complication   . Herpes zoster with other nervous system complications(053.19)   . Lumbago   . Anticoagulated on Coumadin, chronic for A. Fib 04/08/2012  . COPD (chronic obstructive pulmonary disease)   . 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 Pneumonia 4 years ago  . Bronchitis, chronic   . Fibromyalgia   . Arthritis   . CAD (coronary artery disease) 04/29/2006    echo -EF 45-50%, impaired LV relaxation  . 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 GI bleed 04/07/2012    endoscopy/colonoscopy unremarkable  . 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 DJD (degenerative joint disease)   . Myocardial infarction   . Anemia      MEDICATIONS:  Scheduled Meds: . antiseptic oral rinse  15 mL Mouth Rinse q12n4p  . chlorhexidine  15 mL Mouth Rinse BID  . Chlorhexidine Gluconate Cloth  6 each Topical Q0600  . ipratropium-albuterol  3 mL Nebulization QID  .  metoprolol  5 mg Intravenous 4 times per day  . mupirocin ointment  1 application Nasal BID  . piperacillin-tazobactam (ZOSYN)  IV  3.375 g Intravenous Q8H  . vancomycin  750 mg Intravenous Q12H   Continuous Infusions: . sodium chloride 40 mL/hr at 11/20/13 1117  . dexmedetomidine 1.2 mcg/kg/hr (11/22/13 0404)   PRN Meds:.acetaminophen, albuterol, haloperidol lactate, LORazepam, metoprolol, morphine injection, ondansetron (ZOFRAN) IV  ROS: unable to obtain, patient sedated.   ALLERGIES: No Known Allergies  Family History  Problem Relation Age of Onset  . Heart disease Mother   . Lung disease Father   . Kidney disease       Past  Surgical History  Procedure Laterality Date  . Vesicovaginal fistula closure w/ tah    . Cardiac catheterization  01/06/2010    RCA mid artery stenosis at 99%, LAD proximal occlusion 90%  at origin of ramus  . Nissen fundoplication    . Esophagogastroduodenoscopy  04/09/2012    Procedure: ESOPHAGOGASTRODUODENOSCOPY (EGD);  Surgeon: Beryle Beams, MD;  Location: [facility] ENDOSCOPY;  Service: Endoscopy;  Laterality: N/A;  tentatively scheduled per Trula Slade due to patients breathing problems  . Colonoscopy  04/11/2012    Procedure: COLONOSCOPY;  Surgeon: Ladene Artist, MD,FACG;  Location: [facility] ENDOSCOPY;  Service: Endoscopy;  Laterality: N/A;  . Abdominal hysterectomy  at 73 years old  . Appendectomy  about 50 years ago  . Coronary artery bypass graft  1997    LIMA to LAD, SVG to ramus, SVG to RCA  . Cholecystectomy  50 years ago  . Neck surgery  73 years old    full movement  . Right knee arthroscopy  3 years ago  . Eye surgery  2005    cataracts both eyes  . Back surgery      x5, "birdcage" and fused in lower back  . Total knee arthroplasty  06/28/2012    Procedure: TOTAL KNEE ARTHROPLASTY;  Surgeon: Sydnee Cabal, MD;  Location: [facility] ORS;  Service: Orthopedics;  Laterality: Left;  . Joint replacement Left 06/28/12    knee replacement    History   Social History  . Marital Status: Married    Spouse Name: Glendell Docker    Number of Children: N/A  . Years of Education: N/A   Occupational History  . Retired   .     Social History Main Topics  . Smoking status: Former Smoker -- 1.00 packs/day for 22 years    Types: Cigarettes    Quit date: 07/20/1984  . Smokeless tobacco: Never Used  . Alcohol Use: No  . Drug Use: No  . Sexual Activity: Not Currently   Other Topics Concern  . Not on file   Social History Narrative  . No narrative on file     PHYSICAL EXAMINATION:   Filed Vitals:   11/22/13 0800  BP: 162/90  Pulse:   Temp:   Resp: 23   Filed Weights   11/20/13 0400  11/21/13 0500 11/22/13 0400  Weight: 217 lb 2.5 oz (98.5 kg) 210 lb 12.2 oz (95.6 kg) 213 lb 6.5 oz (96.8 kg)     General appearance: 73 y/o wf  lethargic. Responds to voice but does not follow commands. Head: Normocephalic, without obvious abnormality, atraumatic. Edentulous. Neck: no adenopathy, no JVD, supple, symmetrical, trachea midline and thyroid not enlarged, symmetric, no tenderness/mass/nodules  Lymph nodes: Cervical, supraclavicular, and axillary nodes normal.  Resp:bibasilar rales no wheezing, bilateral rhonchi. Cardio: irregular rate and rhythm, S1, S2 normal,  no murmur, click, rub or gallop  GI: soft, non-tender; bowel sounds normal; no masses, no organomegaly  Extremities: extremities normal, atraumatic, no cyanosis, trace pedal edema bilaterally.     LABORATORY/RADIOLOGY DATA:   Recent Labs Lab 11/17/13 1321 12-02-2013 0735 11/20/13 0335 11/21/13 0305 11/22/13 0300  WBC  --  8.8 5.7 4.7 5.2  HGB 9.0* 9.1* 7.1* 8.8* 8.4*  HCT 28.6* 28.4* 22.9* 27.0* 27.2*  PLT  --  97* 77* 70* 61*  MCV  --  91.9 93.5 91.5 93.8  MCH  --  29.4 29.0 29.8 29.0  MCHC  --  32.0 31.0 32.6 30.9  RDW  --  23.5* 23.6* 22.8* 23.0*  LYMPHSABS  --  3.7  --   --   --   MONOABS  --  3.3*  --   --   --   EOSABS  --  0.0  --   --   --   BASOSABS  --  0.2*  --   --   --     CMP    Recent Labs Lab 12/02/2013 0735 11/21/13 0305 11/22/13 0300  NA 140 142 145  K 4.4 3.5* 3.6*  CL 102 108 110  CO2 21 20 21   GLUCOSE 110* 128* 135*  BUN 11 9 11   CREATININE 1.17* 1.08 1.05  CALCIUM 8.6 7.6* 7.9*  AST 38*  --   --   ALT 14  --   --   ALKPHOS 122*  --   --   BILITOT 1.1  --   --         Component Value Date/Time   BILITOT 1.1 02-Dec-2013 0735   BILITOT 1.02 10/02/2013 1330   BILIDIR 0.2 05/24/2013 1657    Anemia panel:    Recent Labs  12-02-13 1149  VITAMINB12 1149*     Recent Labs  12/02/13 1149  TSH 3.310        Component Value Date/Time   ESRSEDRATE 53*  05/24/2013 1657     Recent Labs Lab 11/17/13 0448 Dec 02, 2013 0735 11/20/13 0335 11/21/13 0305 11/22/13 0300  INR 2.85* 3.78* 1.67* 1.25 1.31      Urinalysis    Component Value Date/Time   COLORURINE AMBER* 11/21/2013 1636   APPEARANCEUR CLEAR 11/21/2013 1636   LABSPEC 1.029 11/21/2013 1636   PHURINE 6.0 11/21/2013 1636   GLUCOSEU NEGATIVE 11/21/2013 1636   GLUCOSEU NEGATIVE 05/08/2009 1134   HGBUR SMALL* 11/21/2013 1636   BILIRUBINUR SMALL* 11/21/2013 1636   KETONESUR NEGATIVE 11/21/2013 1636   PROTEINUR >300* 11/21/2013 1636   UROBILINOGEN 1.0 11/21/2013 1636   NITRITE NEGATIVE 11/21/2013 1636   LEUKOCYTESUR NEGATIVE 11/21/2013 1636    Drugs of Abuse  No results found for this basename: labopia, cocainscrnur, labbenz, amphetmu, thcu, labbarb     Liver Function Tests:  Recent Labs Lab 12-02-2013 0735  AST 38*  ALT 14  ALKPHOS 122*  BILITOT 1.1  PROT 7.8  ALBUMIN 2.9*    CBG:  Recent Labs Lab 12/02/2013 1509  GLUCAP 108*    Thyroid function studies  Recent Labs  12/02/13 1149  TSH 3.310    Radiology Studies:   Ct Head Wo Contrast  02-Dec-2013   CLINICAL DATA:  Altered mental status  EXAM: CT HEAD WITHOUT CONTRAST  TECHNIQUE: Contiguous axial images were obtained from the base of the skull through the vertex without intravenous contrast.  COMPARISON:  11/12/2011  FINDINGS: Ventricles are normal in configuration there is ventricular and sulcal enlargement reflecting atrophy. No  hydrocephalus.  No parenchymal masses or mass effect there is no evidence of a cortical infarct.  There are no extra-axial masses or abnormal fluid collections.  No intracranial hemorrhage.  Visualized sinuses and mastoid air cells are clear.  IMPRESSION: 1. No acute intracranial abnormalities.  No intracranial hemorrhage. 2. Stable atrophy.   Electronically Signed   By: Amie Portland M.D.   On: 12/13/2013 18:05   Ct Head Wo Contrast  11/11/2013    COMPARISON:  Prior CT head 10/18/2012  FINDINGS: Negative  for acute intracranial hemorrhage, acute infarction, mass, mass effect, hydrocephalus or midline shift. Gray-white differentiation is preserved throughout. Global cerebral and cerebellar volume loss. Stable periventricular and subcortical white matter hypoattenuation most consistent with sequelae of longstanding microvascular ischemia. Symmetric and unremarkable appearance of the globes and orbits. Normal aeration of the mastoid air cells and visualized paranasal sinuses. Atherosclerotic calcifications in the bilateral cavernous and supra clinoid internal carotid arteries.  IMPRESSION: 1. No acute intracranial process. 2. Stable cerebral atrophy and chronic microvascular ischemic white matter disease. 3. Intracranial atherosclerosis.   Electronically Signed   By: Malachy Moan M.D.   On: 11/11/2013 07:23   Ct Chest Wo Contrast  11/20/2013   ADDENDUM REPORT: 11/26/2013 18:00  ADDENDUM: Not mentioned in the original report. There are secretions partly filling the distal trachea at the carina extending into the left mainstem bronchus.   Electronically Signed   By: Amie Portland M.D.   On: 11/26/2013 18:00   12/08/2013  COMPARISON:  Current chest radiograph.  Chest CT, 08/31/2013.  FINDINGS: There is patchy ground-glass opacity that predominates in the upper lobes. There is focal, masslike, confluent opacity in several locations, most prominently in the left lower lobe and in the anteromedial right middle lobe at the lung base. There are minimal right and small left pleural effusions. Underlying emphysema is noted in the upper lobes.  Heart is mildly enlarged. There are dense coronary artery calcifications  Mediastinal adenopathy is noted. Reference measurements were made of several nodes There is a precarinal lymph node measuring 16 mm in short axis. There is a prevascular node measuring 16 mm short axis. A number prevascular node adjacent to the AP window measures 11 mm in short axis. A subcarinal node measures  15 mm short axis. Bile hilar adenopathy is noted not well-defined due to lack of contrast. Adenopathy has mildly increased when compared to the prior study.  Limited evaluation of the upper abdomen is unremarkable.  No osteoblastic or osteolytic lesions. There is a mild compression fracture of the upper endplate of T4. This is stable.  IMPRESSION: 1. Findings are consistent with extensive, bilateral, multifocal pneumonia. There are patchy areas of upper lobe predominant ground-glass opacity. There also focal masslike areas of consolidation predominating in the left lower lobe and right anterior medial middle lobe. There is associated mediastinal and hilar adenopathy. Minimal right and small left effusions.  Electronically Signed: By: Amie Portland M.D. On: 12/04/2013 17:41   Dg Chest Port 1 View  11/22/2013   CLINICAL DATA:  Assess airspace disease  EXAM: PORTABLE CHEST - 1 VIEW  COMPARISON:  12/14/2013  FINDINGS: Diffuse increase in interstitial markings. Patchy bilateral consolidation as seen on previous chest CT. These densities have increased at the bases. No increasing pleural fluid. No pneumothorax. Stable cardiomegaly, status post CABG.  IMPRESSION: Worsening aeration, likely a combination of worsening edema and pneumonia.   Electronically Signed   By: Tiburcio Pea M.D.   On: 11/22/2013 05:57  ASSESSMENT AND PLAN:  73 years old white female recently diagnosed with:   1) refractory anemia with excess blasts, myelodysplastic syndrome with 5 q deletion.  2) plasma cell dyscrasia   On  Revlimid 10 mg by mouth daily for 21 days every 4 weeks with goal of decreasing requirement for PRBCs transfusion from her myelodysplastic syndrome and for treatment of her plasma cell dyscrasia. Last  Cycle initiated on 4/24.  3. Sepsis  MRSA screen positive In the setting of recurrent pneumonia . On IVF and IV antibiotics  4. Acute Respiratory Failure 5. HCAP 6. COPD Secondary to pneumonia/aspiration  pneumonitis. On IV antibiotics, IVF and bronchodilators with O2 [place] at 4 lts  7. AMS: likely secondary to polypharmacy, including narcotics and muscle relaxers. As per admitting team.  8. DNR     **Disclaimer: This note was dictated with voice recognition software. Similar sounding words can inadvertently be transcribed and this note may contain transcription errors which may not have been corrected upon publication of note.Rondel Jumbo, PA-C 11/22/2013, 8:57 AM  ADDENDUM: Hematology/Oncology Attending:  The patient is seen and examined today. I agree with the above note. Her husband and another family member at the bedside. The patient is poorly responsive. She was readmitted with sepsis secondary to recurrent pneumonia. Her condition is deteriorating rapidly. I have a lengthy discussion with the patient's husband today about her current condition and treatment options. I strongly recommended for him to consider palliative care and hospice at this point. He is in agreement with the current plan. Thank you for taking good care of [name]. Please call if you have any questions.

## 2013-11-22 NOTE — Progress Notes (Signed)
PROGRESS NOTE  [name] HER:[id number] DOB: 14-Feb-1941 DOA: 12/05/2013 PCP: Noralee Space, MD  HPI: 73 year old female with a history of myelodysplastic syndrome, plasma cell dyscrasia, COPD, chronic diastolic CHF, CKD stage III, chronic atrial fibrillation on anticoagulation, and hyperlipidemia presents with increasing confusion that began on the evening of 11/18/2013. She was also noted to have a fever of 101.25F at home. The patient has had 3 hospital admissions in the last 3 months with similar presentations. She was most recently discharged from the hospital on 11/17/2013 after treatment for HCAP. She was sent home with Augmentin x2 days. The patient has only had 2 doses of Revlimid (last on 11/10/13) since her bone marrow biopsy diagnosis of refractory anemia with excess blasts (Dr. Julien Nordmann). The patient was noted to be hypoxemic with oxygen saturation in the 80s and hypotensive in emergency department. Given 2L NS in ED. The patient required a short period of Neo-Synephrine after which he was weaned off. She required sedation with Haldol and Ativan due to agitation. The patient was started on intravenous vancomycin and Zosyn for HCAP. CT of the chest reveals patchy groundglass opacities in the bilateral upper lobes as well as confluent opacities in the LLL and RML. As per the husband, there is been no history of vomiting, diarrhea, hematochezia, melena. There've been no other changes in her medications except for antibiotics and recent Revlimid. On 11/20/2013, the patient became severely agitated requiring 14 mg of Haldol and 5 mg of Ativan over 60 minutes, and she remained agitated the patient was placed on Precedex. PCCM is following.  Assessment/Plan:  Goals of care - Dr. Julien Nordmann, Dr. Lamonte Sakai and myself discussed with her family about her multiple recent hospitalizations, recurrent lung infections and myelodysplastic syndrome and need for chemotherapy. Despite aggressive antibiotics and  treatment, patient is not improving, PCCM and feels that she is not protecting her airway, she is DO NOT RESUSCITATE/DO NOT INTUBATE and it's unlikely at this point that she will get better from this illness. Moreover, she is not a candidate for chemotherapy, and Dr. Julien Nordmann discussed with the family and recommended palliative care consult, which was obtained. Appreciate PCCM input, plan to discontinue the Precedex drip and patient was started on a morphine drip this afternoon Sepsis due to pneumonia - patient's respiratory status continues to get worse and patient is currently felt not to be able to protect her airway. She is not a candidate for intubation and is DNR. I agree with transitioning her care to comfort and discontinue antibiotics.  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  -Transfused 1 unit PRBC 02/21/62  Chronic diastolic CHF  - EF 14-97%  Acute metabolic encephalopathy  -Secondary to infectious process and acute respiratory failure  Polypharmacy  -d/c all hypnotic meds including muscle relaxers  CKD stage III  -Baseline creatinine 1.1-1.5  COPD  -Continue bronchodilators   Diet: NPO Fluids: none DVT Prophylaxis: scd   Code Status: DNR Family Communication: Husband and daughter at bedside  Disposition Plan: Inpatient, anticipate hospital death  Consultants:  PCCM  Oncology  Palliative care  Procedures:  None   Antibiotics Zosyn 5/3 >>> 5/6  Vanc IV 5/3 >>> 5/6  HPI/Subjective: Patient obtunded  and unresponsive  Objective: Filed Vitals:   11/22/13 1000 11/22/13 1100 11/22/13 1200 11/22/13 1251  BP: 168/82 163/82 135/67     Pulse: 75 86 81   Temp:   101.6 F (38.7 C)   TempSrc:   Axillary   Resp: $Remo'22 24 26   'OJwss$ Height:      Weight:      SpO2: 100% 100% 100% 100%    Intake/Output Summary (Last 24 hours) at 11/22/13 1301 Last data filed at 11/22/13 1200  Gross per 24 hour  Intake   1957 ml  Output    700 ml  Net   1257 ml   Filed Weights   11/20/13 0400 11/21/13 0500 11/22/13 0400  Weight: 98.5 kg (217 lb 2.5 oz) 95.6 kg (210 lb 12.2 oz) 96.8 kg (213 lb 6.5 oz)    Exam:   General:  Nonresponsive  Cardiovascular: regular rate and rhythm, without MRG  Respiratory: Poor air movement, no wheezing  Abdomen: soft, positive bowel sounds  MSK: no peripheral edema  Neuro: Unable to assess  Data Reviewed: Basic Metabolic Panel:  Recent Labs Lab 11/22/2013 0735 11/21/13 0305 11/22/13 0300  NA 140 142 145  K 4.4 3.5* 3.6*  CL 102 108 110  CO2 $Re'21 20 21  'AZe$ GLUCOSE 110* 128* 135*  BUN $Re'11 9 11  'mct$ CREATININE 1.17* 1.08 1.05  CALCIUM 8.6 7.6* 7.9*   Liver Function Tests:  Recent Labs Lab 12/04/2013 0735  AST 38*  ALT 14  ALKPHOS 122*  BILITOT 1.1  PROT 7.8  ALBUMIN 2.9*   CBC:  Recent Labs Lab 11/17/13 1321 11/20/2013 0735 11/20/13 0335 11/21/13 0305 11/22/13 0300  WBC  --  8.8 5.7 4.7 5.2  NEUTROABS  --  1.6*  --   --   --   HGB 9.0* 9.1* 7.1* 8.8* 8.4*  HCT 28.6* 28.4* 22.9* 27.0* 27.2*  MCV  --  91.9 93.5 91.5 93.8  PLT  --  97* 77* 70* 61*   BNP (last 3 results)  Recent Labs  09/21/13 1125 10/22/13 0844 11/22/13 0300  PROBNP 203.0* 5985.0* 22932.0*   CBG:  Recent Labs Lab 12/11/2013 1509  GLUCAP 108*    Recent Results (from the past 240 hour(s))  CULTURE, EXPECTORATED SPUTUM-ASSESSMENT     Status: None   Collection Time    11/13/13  4:03 PM      Result Value Ref Range Status   Specimen Description SPUTUM   Final   Special Requests Immunocompromised   Final   Sputum evaluation     Final   Value: THIS SPECIMEN IS ACCEPTABLE. RESPIRATORY CULTURE REPORT TO FOLLOW.    Report Status 11/13/2013 FINAL   Final  CULTURE, RESPIRATORY (NON-EXPECTORATED)     Status: None   Collection Time    11/13/13  4:03 PM      Result Value Ref Range Status   Specimen Description SPUTUM   Final   Special Requests NONE   Final   Gram Stain     Final   Value: NO WBC SEEN     NO SQUAMOUS EPITHELIAL CELLS SEEN     NO ORGANISMS SEEN     Performed at [facility]   Culture     Final   Value: RARE FUNGUS (MOLD) ISOLATED, PROBABLE CONTAMINANT/COLONIZER (SAPROPHYTE). CONTACT MICROBIOLOGY IF FURTHER IDENTIFICATION REQUIRED [phone].     Performed at [facility]   Report Status 11/16/2013 FINAL   Final  CULTURE, BLOOD (ROUTINE X 2)  Status: None   Collection Time    11/14/13  2:50 AM      Result Value Ref Range Status   Specimen Description BLOOD RIGHT ARM   Final   Special Requests BOTTLES DRAWN AEROBIC AND ANAEROBIC 5CC   Final   Culture  Setup Time     Final   Value: 11/14/2013 09:35     Performed at [facility]   Culture     Final   Value: NO GROWTH 5 DAYS     Performed at [facility]   Report Status 11/20/2013 FINAL   Final  CULTURE, BLOOD (ROUTINE X 2)     Status: None   Collection Time    11/14/13  2:55 AM      Result Value Ref Range Status   Specimen Description BLOOD RIGHT HAND   Final   Special Requests BOTTLES DRAWN AEROBIC ONLY 3CC   Final   Culture  Setup Time     Final   Value: 11/14/2013 09:35     Performed at [facility]   Culture     Final   Value: NO GROWTH 5 DAYS     Performed at [facility]   Report Status 11/20/2013 FINAL   Final  CULTURE, BLOOD (ROUTINE X 2)     Status: None   Collection Time    11/18/2013  7:35 AM      Result Value Ref Range Status   Specimen Description BLOOD RIGHT WRIST   Final   Special Requests BOTTLES DRAWN AEROBIC AND ANAEROBIC 4CC [facility]   Final   Culture  Setup Time     Final   Value: 12/06/2013 11:54     Performed at [facility]   Culture     Final    Value:        BLOOD CULTURE RECEIVED NO GROWTH TO DATE CULTURE WILL BE HELD FOR 5 DAYS BEFORE ISSUING A FINAL NEGATIVE REPORT     Performed at [facility]   Report Status PENDING   Incomplete  CULTURE, BLOOD (ROUTINE X 2)     Status: None   Collection Time    12/07/2013  8:05 AM      Result Value Ref Range Status   Specimen Description BLOOD LEFT HAND   Final   Special Requests BOTTLES DRAWN AEROBIC AND ANAEROBIC [facility] EACH   Final   Culture  Setup Time     Final   Value: 11/18/2013 11:54     Performed at [facility]   Culture     Final   Value:        BLOOD CULTURE RECEIVED NO GROWTH TO DATE CULTURE WILL BE HELD FOR 5 DAYS BEFORE ISSUING A FINAL NEGATIVE REPORT     Performed at [facility]   Report Status PENDING   Incomplete  MRSA PCR SCREENING     Status: Abnormal   Collection Time    11/17/2013 11:09 AM      Result Value Ref Range Status   MRSA by PCR POSITIVE (*) NEGATIVE Final   Comment:            The GeneXpert MRSA Assay (FDA     approved for NASAL specimens     only), is one component of a     comprehensive MRSA colonization     surveillance program. It is not     intended to diagnose MRSA     infection nor to guide  or     monitor treatment for     MRSA infections.     RESULT CALLED TO, READ BACK BY AND VERIFIED WITH:     Johnnette Litter [id number] @ 2774 BY J SCOTTON  URINE CULTURE     Status: None   Collection Time    11/28/2013  2:34 PM      Result Value Ref Range Status   Specimen Description URINE, CATHETERIZED   Final   Special Requests NONE   Final   Culture  Setup Time     Final   Value: 12/02/2013 17:41     Performed at [place]     Final   Value: NO GROWTH     Performed at [facility]   Culture     Final   Value: NO GROWTH     Performed at [facility]   Report Status 11/20/2013 FINAL   Final     Studies: Dg Chest Port 1 View  11/22/2013   CLINICAL DATA:  Assess airspace disease  EXAM:  PORTABLE CHEST - 1 VIEW  COMPARISON:  12/13/2013  FINDINGS: Diffuse increase in interstitial markings. Patchy bilateral consolidation as seen on previous chest CT. These densities have increased at the bases. No increasing pleural fluid. No pneumothorax. Stable cardiomegaly, status post CABG.  IMPRESSION: Worsening aeration, likely a combination of worsening edema and pneumonia.   Electronically Signed   By: Jorje Guild M.D.   On: 11/22/2013 05:57    Scheduled Meds: . antiseptic oral rinse  15 mL Mouth Rinse q12n4p  . chlorhexidine  15 mL Mouth Rinse BID  . Chlorhexidine Gluconate Cloth  6 each Topical Q0600  . mupirocin ointment  1 application Nasal BID   Continuous Infusions: . sodium chloride 40 mL/hr at 11/20/13 1117  . dexmedetomidine 0.6 mcg/kg/hr (11/22/13 1242)  . morphine 3 mg/hr (11/22/13 1225)    Active Problems:   COPD (chronic obstructive pulmonary disease)   CKD (chronic kidney disease), stage III   Atrial fibrillation with RVR   CAD (coronary artery disease), native coronary artery   MDS (myelodysplastic syndrome) with 5q deletion   Acute encephalopathy   HCAP (healthcare-associated pneumonia)   Time spent: 35  This note has been created with Surveyor, quantity. Any transcriptional errors are unintentional.   Marzetta Board, MD [name] Pager [phone]. If 7 PM - 7 AM, please contact night-coverage at [email or web address], password [facility] 11/22/2013, 1:01 PM  LOS: 3 days

## 2013-11-23 DIAGNOSIS — I5033 Acute on chronic diastolic (congestive) heart failure: Secondary | ICD-10-CM | POA: Diagnosis present

## 2013-11-23 DIAGNOSIS — J189 Pneumonia, unspecified organism: Secondary | ICD-10-CM

## 2013-11-23 DIAGNOSIS — N183 Chronic kidney disease, stage 3 unspecified: Secondary | ICD-10-CM

## 2013-11-23 DIAGNOSIS — I4891 Unspecified atrial fibrillation: Secondary | ICD-10-CM

## 2013-11-23 DIAGNOSIS — J9600 Acute respiratory failure, unspecified whether with hypoxia or hypercapnia: Secondary | ICD-10-CM

## 2013-11-23 DIAGNOSIS — E8809 Other disorders of plasma-protein metabolism, not elsewhere classified: Secondary | ICD-10-CM

## 2013-11-23 DIAGNOSIS — D696 Thrombocytopenia, unspecified: Secondary | ICD-10-CM | POA: Diagnosis present

## 2013-11-23 DIAGNOSIS — D649 Anemia, unspecified: Secondary | ICD-10-CM | POA: Diagnosis present

## 2013-11-23 MED ORDER — KETOROLAC TROMETHAMINE 15 MG/ML IJ SOLN: 15.0000 mg | Freq: Four times a day (QID) | INTRAMUSCULAR | Status: DC | PRN

## 2013-11-23 MED ORDER — ATROPINE SULFATE 1 % OP SOLN
2.0000 [drp] | OPHTHALMIC | Status: DC | PRN
  Filled 2013-11-23: qty 2

## 2013-11-23 MED ORDER — GLYCOPYRROLATE 0.2 MG/ML IJ SOLN
0.1000 mg | INTRAMUSCULAR | Status: DC | PRN
  Filled 2013-11-23: qty 0.5

## 2013-11-23 MED ORDER — IPRATROPIUM-ALBUTEROL 0.5-2.5 (3) MG/3ML IN SOLN
3.0000 mL | Freq: Four times a day (QID) | RESPIRATORY_TRACT | Status: DC
Start: 2013-11-23 — End: 2013-11-23
  Filled 2013-11-23: qty 3

## 2013-11-23 MED ORDER — LORAZEPAM 2 MG/ML IJ SOLN: 1.0000 mg | INTRAMUSCULAR | Status: DC | PRN

## 2013-11-23 MED ORDER — MORPHINE SULFATE 4 MG/ML IJ SOLN: 4.0000 mg | INTRAMUSCULAR | Status: DC | PRN

## 2013-11-23 MED ORDER — SCOPOLAMINE 1 MG/3DAYS TD PT72
1.0000 | MEDICATED_PATCH | TRANSDERMAL | Status: DC
  Administered 2013-11-23: 1.5 mg via TRANSDERMAL
  Filled 2013-11-23: qty 1

## 2013-11-24 LAB — TYPE AND SCREEN
ABO/RH(D): O NEG
Antibody Screen: NEGATIVE
Donor AG Type: NEGATIVE
Donor AG Type: NEGATIVE
Unit division: 0
Unit division: 0

## 2013-11-25 LAB — CULTURE, BLOOD (ROUTINE X 2)
CULTURE: NO GROWTH
Culture: NO GROWTH

## 2013-11-28 ENCOUNTER — Telehealth: Payer: Self-pay | Admitting: Pharmacist Clinician (PhC)/ Clinical Pharmacy Specialist

## 2013-11-28 NOTE — Telephone Encounter (Signed)
He called to let us know [name] expired on 11/29/13.

## 2013-12-06 ENCOUNTER — Ambulatory Visit: Payer: Medicare Other | Admitting: Pulmonary Disease

## 2013-12-18 NOTE — Progress Notes (Signed)
Assessed patient with [name]. Did not hear heart beat or feel pulse.

## 2013-12-18 NOTE — Progress Notes (Signed)
Took down patient's morphine drip. Patient had morphine drip of 1mg /ml. Wasted 130 mL in sink with Charlsie Merles, RN

## 2013-12-18 NOTE — Progress Notes (Signed)
Family meeting and goals of care discussion planned for 2PM on 5/7. Full consult to follow.  Lane Hacker, DO Palliative Medicine

## 2013-12-18 NOTE — Consult Note (Signed)
Palliative Medicine Team at [facility]  Date: 11/18/2013   Patient Name: [name]  DOB: 1941-05-27  MRN: [id number]  Age / Sex: 73 y.o., female   PCP: Noralee Space, MD Referring Physician: Caren Griffins, MD  HPI/Reason for Consultation: 73 yo with multiple end stage chronic medical problems actively dying of acute on chronic respiratory failure. PMT consulted for symptom management and EOL support for family.   Participants in Discussion: Multiple family members: husband, daughter, sons and cousins  Goals/Summary of Discussion:  1. Code Status:  DNR  2. Scope of Treatment:  Full Comfort  3. Assessment/Plan: Primary Diagnoses   myelodysplastic syndrome  plasma cell dyscrasia  COPD  Chronic diastolic CHF  CKD stage III  Chronic atrial fibrillation on anticoagulation  Prognosis: hours PPS 10  Active Symptoms 1. Severe Dyspnea  4. Palliative Prophylaxis:   Bowel Regimen yes  Terminal Secretions yes  Breakthrough Pain and Dyspnea yes  Agitation and Delirium yes  Nausea yes  5. Psychosocial Spiritual Asssessment/Interventions:  Patient and Family Adjustment to Illness/Prognosis: coping well  Spiritual Concerns or Needs: chaplain consult  6. Disposition: Hospital death   Time: 35 minutes 2:20-2:55PM Greater than 50%  of this time was spent counseling and coordinating care related to the above assessment and plan.  Signed by: Acquanetta Chain, DO  11/22/2013, 2:51 PM  Please contact Palliative Medicine Team phone at [phone] for questions and concerns.

## 2013-12-18 NOTE — Discharge Summary (Addendum)
Physician Death Summary  [name] GDJ:[id number] DOB: [birth year] DOA: 12/12/2013  PCP: Noralee Space, MD  Admit date: 12/10/2013 Discharge date: Nov 27, 2013  Time spent: 35 minutes  Discharge Diagnoses:  Active Problems:   COPD (chronic obstructive pulmonary disease)   CKD (chronic kidney disease), stage III   Acute respiratory failure with hypoxia   Atrial fibrillation with RVR   Sepsis(995.91)   CAD (coronary artery disease), native coronary artery   MDS (myelodysplastic syndrome) with 5q deletion   Plasma cell dyscrasia   Encephalopathy acute   Acute encephalopathy   Sepsis   HCAP (healthcare-associated pneumonia)   Acute on chronic diastolic heart failure   Anemia   Thrombocytopenia, unspecified   Filed Weights   11/21/13 0500 Nov 26, 2013 0400  Weight: 95.6 kg (210 lb 12.2 oz) 96.8 kg (213 lb 6.5 oz)   History of present illness:  [name] is a 73 y.o. female with history of COPD, Afib on coumadin, CHF, CAD status post CABG, MDS and plasma cell dyscrasia now on Revlimid was just discharged from [facility] Friday after treatment for Sepsis, Pneumonia, she clinically improved and was sent home on Augmentin. Was also at [facility] 2 weeks prior for same. History is limited since she is confused and unable to contact spouse at this time, per daughter in law, she has been confused, and delirious, difficult to manage since discharge home, she reports compliance with home medications, she reportedly hasnt slept a wink in 2 days. Was brought back to the ER today, Initially vitals with sats of 80% on RA, improved with O2, CXR with persistent infiltrates, temp of 99.4, delirious  Hospital Course:  Goals of care - Dr. Julien Nordmann, Dr. Lamonte Sakai and myself discussed with her family about her multiple recent hospitalizations, recurrent lung infections and myelodysplastic syndrome and need for chemotherapy. Despite aggressive antibiotics and treatment, patient is not improving, PCCM and feels that  she was not adequately protecting her airway, she is DNR. Moreover, she is further not a candidate for chemotherapy, and Dr. Julien Nordmann discussed with the family and recommended palliative care consult, which was obtained. Given worsening respiratory status and agitation, patient started on Morphine gtt 5/6 for discomfort, transferred to floor 11/27/22. Patient passed away on 11/28/2022.  Sepsis due to pneumonia - patient's respiratory status continues to get worse and patient was currently felt not to be able to protect her airway in the setting of DNR/DNI. 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    Significant Diagnostic Studies: Dg Chest 2 View  11/16/2013   CLINICAL DATA:  Short of breath.  Pulmonary edema.  EXAM: CHEST  2 VIEW  COMPARISON:  11/14/2013.  FINDINGS: Compared to the prior study, of the pulmonary aeration has improved. There is persistent basilar opacity, greater on the left than right, some of which probably represents residual Edema and some atelectasis. Superimposed pneumonia at cannot be excluded but is not favored.  The cardiopericardial silhouette is within normal limits. Postoperative changes CABG. Monitoring leads project over the chest. Small bilateral pleural effusions  remain present with blunting of both costophrenic angles.  IMPRESSION: Improving CHF with mild persistent airspace disease and atelectasis small residual bilateral pleural effusions.    Electronically Signed   By: Dereck Ligas M.D.   On: 11/16/2013 10:38   Dg Chest 2 View  11/12/2013   CLINICAL DATA:  Cough and fever  EXAM: CHEST  2 VIEW  COMPARISON:  DG CHEST 1V PORT dated 11/11/2013  FINDINGS: Sternotomy wires overlie normal cardiac silhouette. There is a left retrocardiac opacity which is subtle. Right lung base is clear. Upper lobes are clear. There is a chronic bronchitic markings.  IMPRESSION: Left lower lobe density representing atelectasis versus infiltrate.   Electronically Signed   By: Suzy Bouchard M.D.   On: 11/12/2013 11:44   Dg Chest 2 View  11/06/2013   CLINICAL DATA:  Shortness of breath.  Cough.  EXAM: CHEST  2 VIEW  COMPARISON:  DG CHEST 1V PORT dated 10/22/2013  FINDINGS: Interim near complete resolution of changes of congestive heart failure and edema noted. Stable cardiomegaly. Prior CABG. No pleural effusion or pneumothorax. Degenerative changes thoracic spine. Mild atelectasis lung bases.  IMPRESSION: Interim near complete resolution of pulmonary edema .   Electronically Signed   By: Marcello Moores  Register   On: 11/06/2013 13:55   Ct Head Wo Contrast  11/28/2013   CLINICAL DATA:  Altered mental status  EXAM: CT HEAD WITHOUT CONTRAST  TECHNIQUE: Contiguous axial images were obtained from the base of the skull through the vertex without intravenous contrast.  COMPARISON:  11/12/2011  FINDINGS: Ventricles are normal in configuration there is ventricular and sulcal enlargement reflecting atrophy. No hydrocephalus.  No parenchymal masses or mass effect there is no evidence of a cortical infarct.  There are no extra-axial masses or abnormal fluid collections.  No intracranial hemorrhage.  Visualized sinuses and mastoid air cells are clear.  IMPRESSION: 1. No acute intracranial abnormalities.  No intracranial hemorrhage. 2. Stable atrophy.   Electronically Signed   By: Lajean Manes M.D.   On: 11/18/2013 18:05   Ct Head Wo Contrast  11/11/2013   CLINICAL DATA:  Altered  mental status, headache, on blood thinner  EXAM: CT HEAD WITHOUT CONTRAST  TECHNIQUE: Contiguous axial images were obtained from the base of the skull through the vertex without intravenous contrast.  COMPARISON:  Prior CT head 10/18/2012  FINDINGS: Negative for acute intracranial hemorrhage, acute infarction, mass, mass effect, hydrocephalus or midline shift. Gray-white differentiation is preserved throughout. Global cerebral and cerebellar volume loss. Stable periventricular and subcortical white matter hypoattenuation most consistent with sequelae of longstanding microvascular ischemia. Symmetric and unremarkable appearance of the globes and orbits. Normal aeration of the mastoid air cells and visualized paranasal sinuses. Atherosclerotic calcifications in the bilateral cavernous and supra clinoid internal carotid arteries.  IMPRESSION: 1. No acute intracranial process. 2. Stable cerebral atrophy and chronic microvascular ischemic white matter disease. 3. Intracranial atherosclerosis.   Electronically Signed   By: Jacqulynn Cadet M.D.   On: 11/11/2013 07:23   Ct Chest Wo Contrast  11/18/2013   ADDENDUM REPORT: 11/18/2013 18:00  ADDENDUM: Not mentioned in the original report. There are secretions partly filling the distal trachea at the carina extending into the left mainstem bronchus.   Electronically Signed   By: Lajean Manes M.D.   On: 12/01/2013 18:00   11/18/2013   CLINICAL DATA:  Hypoxia.  Evaluate for recurrent pneumonia.  EXAM: CT CHEST WITHOUT CONTRAST  TECHNIQUE: Multidetector CT imaging of the chest was performed following  the standard protocol without IV contrast.  COMPARISON:  Current chest radiograph.  Chest CT, 08/31/2013.  FINDINGS: There is patchy ground-glass opacity that predominates in the upper lobes. There is focal, masslike, confluent opacity in several locations, most prominently in the left lower lobe and in the anteromedial right middle lobe at the lung base. There are minimal right  and small left pleural effusions. Underlying emphysema is noted in the upper lobes.  Heart is mildly enlarged. There are dense coronary artery calcifications  Mediastinal adenopathy is noted. Reference measurements were made of several nodes There is a precarinal lymph node measuring 16 mm in short axis. There is a prevascular node measuring 16 mm short axis. A number prevascular node adjacent to the AP window measures 11 mm in short axis. A subcarinal node measures 15 mm short axis. Bile hilar adenopathy is noted not well-defined due to lack of contrast. Adenopathy has mildly increased when compared to the prior study.  Limited evaluation of the upper abdomen is unremarkable.  No osteoblastic or osteolytic lesions. There is a mild compression fracture of the upper endplate of T4. This is stable.  IMPRESSION: 1. Findings are consistent with extensive, bilateral, multifocal pneumonia. There are patchy areas of upper lobe predominant ground-glass opacity. There also focal masslike areas of consolidation predominating in the left lower lobe and right anterior medial middle lobe. There is associated mediastinal and hilar adenopathy. Minimal right and small left effusions.  Electronically Signed: By: Lajean Manes M.D. On: 12/07/2013 17:41   Dg Chest Port 1 View  11/22/2013   CLINICAL DATA:  Assess airspace disease  EXAM: PORTABLE CHEST - 1 VIEW  COMPARISON:  12/08/2013  FINDINGS: Diffuse increase in interstitial markings. Patchy bilateral consolidation as seen on previous chest CT. These densities have increased at the bases. No increasing pleural fluid. No pneumothorax. Stable cardiomegaly, status post CABG.  IMPRESSION: Worsening aeration, likely a combination of worsening edema and pneumonia.   Electronically Signed   By: Jorje Guild M.D.   On: 11/22/2013 05:57   Dg Chest Port 1 View  12/01/2013   CLINICAL DATA:  Shortness of breath.  EXAM: PORTABLE CHEST - 1 VIEW  COMPARISON:  11/16/2013  FINDINGS: Previous  median sternotomy and CABG procedure. There is moderate cardiac enlargement. Persistent patchy airspace opacities within the left lung are identified. There is mild superimposed pulmonary edema.  IMPRESSION: 1. Cardiac enlargement and mild edema suggest CHF. 2. Persistent left lung opacities compatible with multifocal infection.   Electronically Signed   By: Kerby Moors M.D.   On: 11/29/2013 08:27   Dg Chest Port 1 View  11/14/2013   CLINICAL DATA:  Fever and confusion.  Concern for pneumonia.  EXAM: PORTABLE CHEST - 1 VIEW  COMPARISON:  Chest radiograph performed 11/12/2013  FINDINGS: The lungs are well expanded. Vascular congestion is noted. Patchy bilateral airspace opacities are seen, worse on the left. This may reflect pneumonia or pulmonary edema. No definite pleural effusion or pneumothorax is seen. The left costophrenic angle is incompletely imaged on this study.  The cardiomediastinal silhouette remains normal in size. The patient is status post median sternotomy. There is a chronic fracture of the second superiormost sternal wire. No acute osseous abnormalities are seen.  IMPRESSION: Vascular congestion noted. Patchy bilateral airspace opacities, worse on the left. This may reflect pneumonia or pulmonary edema.   Electronically Signed   By: Garald Balding M.D.   On: 11/14/2013 03:31   Dg Chest Port 1 View  11/11/2013  CLINICAL DATA:  Fever, cough, atrial fibrillation  EXAM: PORTABLE CHEST - 1 VIEW  COMPARISON:  DG CHEST 1V PORT dated 11/08/2013; DG CHEST 2 VIEW dated 11/06/2013; DG CHEST 1V PORT dated 10/22/2013  FINDINGS: Mild cardiac enlargement status post CABG. Vascular pattern is normal. There is no consolidation, effusion, or pneumothorax.  IMPRESSION: No active disease.   Electronically Signed   By: Skipper Cliche M.D.   On: 11/11/2013 07:03   Dg Chest Portable 1 View  11/08/2013   CLINICAL DATA:  Fever.  Cough.  History of heart disease.  EXAM: PORTABLE CHEST - 1 VIEW  COMPARISON:  DG  CHEST 2 VIEW dated 11/06/2013; CT CHEST W/CM dated 08/31/2013; DG CHEST 2 VIEW dated 07/25/2013  FINDINGS: Prior CABG. Mild cardiomegaly. Indistinct pulmonary vasculature. Emphysema. No overt edema.  IMPRESSION: 1. Cardiomegaly with mild indistinctness of the pulmonary vasculature which could reflect pulmonary venous hypertension. No overt edema. 2. Emphysema.   Electronically Signed   By: Sherryl Barters M.D.   On: 11/08/2013 07:26    Microbiology: Recent Results (from the past 240 hour(s))  CULTURE, BLOOD (ROUTINE X 2)     Status: None   Collection Time    11/14/13  2:50 AM      Result Value Ref Range Status   Specimen Description BLOOD RIGHT ARM   Final   Special Requests BOTTLES DRAWN AEROBIC AND ANAEROBIC 5CC   Final   Culture  Setup Time     Final   Value: 11/14/2013 09:35     Performed at [facility]   Culture     Final   Value: NO GROWTH 5 DAYS     Performed at [facility]   Report Status 11/20/2013 FINAL   Final  CULTURE, BLOOD (ROUTINE X 2)     Status: None   Collection Time    11/14/13  2:55 AM      Result Value Ref Range Status   Specimen Description BLOOD RIGHT HAND   Final   Special Requests BOTTLES DRAWN AEROBIC ONLY 3CC   Final   Culture  Setup Time     Final   Value: 11/14/2013 09:35     Performed at [facility]   Culture     Final   Value: NO GROWTH 5 DAYS     Performed at [facility]   Report Status 11/20/2013 FINAL   Final  CULTURE, BLOOD (ROUTINE X 2)     Status: None   Collection Time    12/02/2013  7:35 AM      Result Value Ref Range Status   Specimen Description BLOOD RIGHT WRIST   Final   Special Requests BOTTLES DRAWN AEROBIC AND ANAEROBIC 4CC [facility]   Final   Culture  Setup Time     Final   Value: 12/05/2013 11:54     Performed at [facility]   Culture     Final   Value:        BLOOD CULTURE RECEIVED NO GROWTH TO DATE CULTURE WILL BE HELD FOR 5 DAYS BEFORE ISSUING A FINAL NEGATIVE REPORT     Performed at [facility]   Report Status PENDING   Incomplete  CULTURE, BLOOD (ROUTINE X 2)     Status: None   Collection Time    11/26/2013  8:05 AM      Result Value Ref Range Status   Specimen Description BLOOD LEFT HAND   Final   Special Requests BOTTLES  DRAWN AEROBIC AND ANAEROBIC [facility] EACH   Final   Culture  Setup Time     Final   Value: 11/22/2013 11:54     Performed at [facility]   Culture     Final   Value:        BLOOD CULTURE RECEIVED NO GROWTH TO DATE CULTURE WILL BE HELD FOR 5 DAYS BEFORE ISSUING A FINAL NEGATIVE REPORT     Performed at [facility]   Report Status PENDING   Incomplete  MRSA PCR SCREENING     Status: Abnormal   Collection Time    12/15/2013 11:09 AM      Result Value Ref Range Status   MRSA by PCR POSITIVE (*) NEGATIVE Final   Comment:            The GeneXpert MRSA Assay (FDA     approved for NASAL specimens     only), is one component of a     comprehensive MRSA colonization     surveillance program. It is not     intended to diagnose MRSA     infection nor to guide or     monitor treatment for     MRSA infections.     RESULT CALLED TO, READ BACK BY AND VERIFIED WITH:     Johnnette Litter [id number] @ 2130 BY J SCOTTON  URINE CULTURE     Status: None   Collection Time    12/11/2013  2:34 PM      Result Value Ref Range Status   Specimen Description URINE, CATHETERIZED   Final   Special Requests NONE   Final   Culture  Setup Time     Final   Value: 12/05/2013 17:41     Performed at [place]     Final   Value: NO GROWTH     Performed at [facility]   Culture     Final   Value: NO GROWTH     Performed at [facility]   Report Status 11/20/2013 FINAL   Final  URINE CULTURE     Status: None   Collection Time    11/21/13  4:36 PM      Result Value Ref Range Status   Specimen Description URINE, RANDOM   Final   Special Requests NONE   Final   Culture  Setup Time     Final   Value: 11/22/2013 02:59      Performed at [place]     Final   Value: NO GROWTH     Performed at [facility]   Culture     Final   Value: NO GROWTH     Performed at [facility]   Report Status 11/22/2013 FINAL   Final     Labs: Basic Metabolic Panel:  Recent Labs Lab 12/17/2013 0735 11/21/13 0305 11/22/13 0300  NA 140 142 145  K 4.4 3.5* 3.6*  CL 102 108 110  CO2 21 20 21   GLUCOSE 110* 128* 135*  BUN 11 9 11   CREATININE 1.17* 1.08 1.05  CALCIUM 8.6 7.6* 7.9*   Liver Function Tests:  Recent Labs Lab 11/17/2013 0735  AST 38*  ALT 14  ALKPHOS 122*  BILITOT 1.1  PROT 7.8  ALBUMIN 2.9*   CBC:  Recent Labs Lab 11/17/13 1321 11/25/2013 0735 11/20/13 0335 11/21/13 0305 11/22/13 0300  WBC  --  8.8 5.7  4.7 5.2  NEUTROABS  --  1.6*  --   --   --   HGB 9.0* 9.1* 7.1* 8.8* 8.4*  HCT 28.6* 28.4* 22.9* 27.0* 27.2*  MCV  --  91.9 93.5 91.5 93.8  PLT  --  97* 77* 70* 61*   BNP: BNP (last 3 results)  Recent Labs  09/21/13 1125 10/22/13 0844 11/22/13 0300  PROBNP 203.0* 5985.0* 22932.0*   CBG:  Recent Labs Lab 12/17/2013 1509  GLUCAP 108*   Signed:  Costin M Gherghe  [name] 2013-12-11, 4:17 PM

## 2013-12-18 NOTE — Progress Notes (Signed)
Patient's family called nurse in to room at 1545. Patient found to be without any signs of breathing and without any heart sounds. MD to be notified of patient's expiration.

## 2013-12-18 NOTE — Progress Notes (Deleted)
PROGRESS NOTE  [name] MHD:[id number] DOB: 1941-07-05 DOA: 11/26/2013 PCP: Noralee Space, MD  HPI: 73 year old female with a history of myelodysplastic syndrome, plasma cell dyscrasia, COPD, chronic diastolic CHF, CKD stage III, chronic atrial fibrillation on anticoagulation, and hyperlipidemia presents with increasing confusion that began on the evening of 11/18/2013. She was also noted to have a fever of 101.60F at home. The patient has had 3 hospital admissions in the last 3 months with similar presentations. She was most recently discharged from the hospital on 11/17/2013 after treatment for HCAP. She was sent home with Augmentin x2 days. The patient has only had 2 doses of Revlimid (last on 11/10/13) since her bone marrow biopsy diagnosis of refractory anemia with excess blasts (Dr. Julien Nordmann). The patient was noted to be hypoxemic with oxygen saturation in the 80s and hypotensive in emergency department. Given 2L NS in ED. The patient required a short period of Neo-Synephrine after which he was weaned off. She required sedation with Haldol and Ativan due to agitation. The patient was started on intravenous vancomycin and Zosyn for HCAP. CT of the chest reveals patchy groundglass opacities in the bilateral upper lobes as well as confluent opacities in the LLL and RML. As per the husband, there is been no history of vomiting, diarrhea, hematochezia, melena. There've been no other changes in her medications except for antibiotics and recent Revlimid. On 11/20/2013, the patient became severely agitated requiring 14 mg of Haldol and 5 mg of Ativan over 60 minutes, and she remained agitated the patient was placed on Precedex. PCCM is following.  Assessment/Plan:  Goals of care - Dr. Julien Nordmann, Dr. Lamonte Sakai and myself discussed with her family about her multiple recent hospitalizations, recurrent lung infections and myelodysplastic syndrome and need for chemotherapy. Despite aggressive antibiotics and  treatment, patient is not improving, PCCM and feels that she is not protecting her airway, she is DO NOT RESUSCITATE/DO NOT INTUBATE and it's unlikely at this point that she will get better from this illness. Moreover, she is not a candidate for chemotherapy, and Dr. Julien Nordmann discussed with the family and recommended palliative care consult, which was obtained.  - patient started on Morphine gtt 5/6 for discomfort/agitation, transferred to floor today  Sepsis due to pneumonia - patient's respiratory status continues to get worse and patient is currently felt not to be able to protect her airway. She is not a candidate for intubation and is DNR. I agree with transitioning her care to comfort and discontinue antibiotics.  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  -Transfused 1 unit PRBC 08/20/17  Chronic diastolic CHF  - EF 41-74%  Acute metabolic encephalopathy  -Secondary to infectious process and acute respiratory failure  Polypharmacy  -d/c all hypnotic meds including muscle relaxers  CKD stage III  -Baseline creatinine 1.1-1.5  COPD  -Continue bronchodilators   Diet: NPO Fluids: none DVT Prophylaxis: scd   Code Status: DNR Family Communication: Husband and daughter at bedside  Disposition Plan: Inpatient, anticipate hospital death  Consultants:  PCCM  Oncology  Palliative care  Procedures:  None   Antibiotics Zosyn 5/3 >>> 5/6  Vanc IV 5/3 >>> 5/6  HPI/Subjective: Patient obtunded and unresponsive  Objective: Filed Vitals:   2013/12/05 0900 Dec 05, 2013 1000 12/05/13 1100 Dec 05, 2013  1211  BP: 1$Rem'09/41 90/44 93/48 'rRoA$ 97/35  Pulse: 162 163  142  Temp:    102.9 F (39.4 C)  TempSrc:    Axillary  Resp: $Remo'9  11 13 7  'ycOVt$ Height:      Weight:      SpO2: 94% 97% 97% 99%    Intake/Output Summary (Last 24 hours) at 12-20-2013 1241 Last data filed at 12/20/13 1100  Gross per 24 hour  Intake 1088.87 ml  Output   1860 ml  Net -771.13 ml   Filed Weights   11/20/13 0400 11/21/13 0500 11/22/13 0400  Weight: 98.5 kg (217 lb 2.5 oz) 95.6 kg (210 lb 12.2 oz) 96.8 kg (213 lb 6.5 oz)    Exam:   General:  Nonresponsive  Cardiovascular: regular rate and rhythm, tachycardic  Respiratory: shallow rare breaths  Abdomen: soft, positive bowel sounds  MSK: no peripheral edema  Neuro: Unable to assess  Data Reviewed: Basic Metabolic Panel:  Recent Labs Lab 11/28/2013 0735 11/21/13 0305 11/22/13 0300  NA 140 142 145  K 4.4 3.5* 3.6*  CL 102 108 110  CO2 $Re'21 20 21  'CNW$ GLUCOSE 110* 128* 135*  BUN $Re'11 9 11  'EJi$ CREATININE 1.17* 1.08 1.05  CALCIUM 8.6 7.6* 7.9*   Liver Function Tests:  Recent Labs Lab 12/12/2013 0735  AST 38*  ALT 14  ALKPHOS 122*  BILITOT 1.1  PROT 7.8  ALBUMIN 2.9*   CBC:  Recent Labs Lab 11/17/13 1321 12/13/2013 0735 11/20/13 0335 11/21/13 0305 11/22/13 0300  WBC  --  8.8 5.7 4.7 5.2  NEUTROABS  --  1.6*  --   --   --   HGB 9.0* 9.1* 7.1* 8.8* 8.4*  HCT 28.6* 28.4* 22.9* 27.0* 27.2*  MCV  --  91.9 93.5 91.5 93.8  PLT  --  97* 77* 70* 61*   BNP (last 3 results)  Recent Labs  09/21/13 1125 10/22/13 0844 11/22/13 0300  PROBNP 203.0* 5985.0* 22932.0*   CBG:  Recent Labs Lab 11/22/2013 1509  GLUCAP 108*    Recent Results (from the past 240 hour(s))  CULTURE, EXPECTORATED SPUTUM-ASSESSMENT     Status: None   Collection Time    11/13/13  4:03 PM      Result Value Ref Range Status   Specimen Description SPUTUM   Final   Special Requests Immunocompromised   Final   Sputum evaluation     Final   Value: THIS SPECIMEN IS ACCEPTABLE. RESPIRATORY CULTURE REPORT TO FOLLOW.   Report Status 11/13/2013 FINAL   Final  CULTURE, RESPIRATORY (NON-EXPECTORATED)      Status: None   Collection Time    11/13/13  4:03 PM      Result Value Ref Range Status   Specimen Description SPUTUM   Final   Special Requests NONE   Final   Gram Stain     Final   Value: NO WBC SEEN     NO SQUAMOUS EPITHELIAL CELLS SEEN     NO ORGANISMS SEEN     Performed at [facility]   Culture     Final   Value: RARE FUNGUS (MOLD) ISOLATED, PROBABLE CONTAMINANT/COLONIZER (SAPROPHYTE). CONTACT MICROBIOLOGY IF FURTHER IDENTIFICATION REQUIRED [phone].     Performed at [facility]   Report Status 11/16/2013 FINAL   Final  CULTURE, BLOOD (ROUTINE X 2)     Status: None   Collection Time    11/14/13  2:50 AM      Result Value Ref Range Status  Specimen Description BLOOD RIGHT ARM   Final   Special Requests BOTTLES DRAWN AEROBIC AND ANAEROBIC 5CC   Final   Culture  Setup Time     Final   Value: 11/14/2013 09:35     Performed at [facility]   Culture     Final   Value: NO GROWTH 5 DAYS     Performed at [facility]   Report Status 11/20/2013 FINAL   Final  CULTURE, BLOOD (ROUTINE X 2)     Status: None   Collection Time    11/14/13  2:55 AM      Result Value Ref Range Status   Specimen Description BLOOD RIGHT HAND   Final   Special Requests BOTTLES DRAWN AEROBIC ONLY 3CC   Final   Culture  Setup Time     Final   Value: 11/14/2013 09:35     Performed at [facility]   Culture     Final   Value: NO GROWTH 5 DAYS     Performed at [facility]   Report Status 11/20/2013 FINAL   Final  CULTURE, BLOOD (ROUTINE X 2)     Status: None   Collection Time    12/04/2013  7:35 AM      Result Value Ref Range Status   Specimen Description BLOOD RIGHT WRIST   Final   Special Requests BOTTLES DRAWN AEROBIC AND ANAEROBIC 4CC [facility]   Final   Culture  Setup Time     Final   Value:  11:54     Performed at [facility]   Culture     Final   Value:        BLOOD CULTURE RECEIVED NO GROWTH TO DATE CULTURE WILL BE HELD FOR 5  DAYS BEFORE ISSUING A FINAL NEGATIVE REPORT     Performed at [facility]   Report Status PENDING   Incomplete  CULTURE, BLOOD (ROUTINE X 2)     Status: None   Collection Time    12/15/2013  8:05 AM      Result Value Ref Range Status   Specimen Description BLOOD LEFT HAND   Final   Special Requests BOTTLES DRAWN AEROBIC AND ANAEROBIC [facility] EACH   Final   Culture  Setup Time     Final   Value: 11/29/2013 11:54     Performed at [facility]   Culture     Final   Value:        BLOOD CULTURE RECEIVED NO GROWTH TO DATE CULTURE WILL BE HELD FOR 5 DAYS BEFORE ISSUING A FINAL NEGATIVE REPORT     Performed at [facility]   Report Status PENDING   Incomplete  MRSA PCR SCREENING     Status: Abnormal   Collection Time    11/27/2013 11:09 AM      Result Value Ref Range Status   MRSA by PCR POSITIVE (*) NEGATIVE Final   Comment:            The GeneXpert MRSA Assay (FDA     approved for NASAL specimens     only), is one component of a     comprehensive MRSA colonization     surveillance program. It is not     intended to diagnose MRSA     infection nor to guide or     monitor treatment for     MRSA infections.     RESULT CALLED TO, READ BACK BY AND  VERIFIED WITHJohnnette Litter [id number] @ [id number] BY J SCOTTON  URINE CULTURE     Status: None   Collection Time    12/11/2013  2:34 PM      Result Value Ref Range Status   Specimen Description URINE, CATHETERIZED   Final   Special Requests NONE   Final   Culture  Setup Time     Final   Value: 11/26/2013 17:41     Performed at [place]     Final   Value: NO GROWTH     Performed at [facility]   Culture     Final   Value: NO GROWTH     Performed at [facility]   Report Status 11/20/2013 FINAL   Final  URINE CULTURE     Status: None   Collection Time    11/21/13  4:36 PM      Result Value Ref Range Status   Specimen Description URINE, RANDOM   Final   Special Requests NONE    Final   Culture  Setup Time     Final   Value: 11/22/2013 02:59     Performed at [place]     Final   Value: NO GROWTH     Performed at [facility]   Culture     Final   Value: NO GROWTH     Performed at [facility]   Report Status 11/22/2013 FINAL   Final     Studies: Dg Chest Port 1 View  11/22/2013   CLINICAL DATA:  Assess airspace disease  EXAM: PORTABLE CHEST - 1 VIEW  COMPARISON:  12/04/2013  FINDINGS: Diffuse increase in interstitial markings. Patchy bilateral consolidation as seen on previous chest CT. These densities have increased at the bases. No increasing pleural fluid. No pneumothorax. Stable cardiomegaly, status post CABG.  IMPRESSION: Worsening aeration, likely a combination of worsening edema and pneumonia.   Electronically Signed   By: Jorje Guild M.D.   On: 11/22/2013 05:57    Scheduled Meds: . antiseptic oral rinse  15 mL Mouth Rinse q12n4p  . chlorhexidine  15 mL Mouth Rinse BID  . Chlorhexidine Gluconate Cloth  6 each Topical Q0600  . mupirocin ointment  1 application Nasal BID   Continuous Infusions: . sodium chloride 40 mL/hr at 11/20/13 1117  . morphine 10 mg/hr (2013/11/26 0246)    Active Problems:   COPD (chronic obstructive pulmonary disease)   CKD (chronic kidney disease), stage III   Atrial fibrillation with RVR   CAD (coronary artery disease), native coronary artery   MDS (myelodysplastic syndrome) with 5q deletion   Acute encephalopathy   HCAP (healthcare-associated pneumonia)   Time spent: 25  This note has been created with Surveyor, quantity. Any transcriptional errors are unintentional.   Marzetta Board, MD [name] Pager [phone]. If 7 PM - 7 AM, please contact night-coverage at [email or web address], password [facility] Nov 26, 2013, 12:41 PM  LOS: 4 days

## 2013-12-18 DEATH — deceased

## 2014-01-15 ENCOUNTER — Ambulatory Visit: Payer: Medicare Other | Admitting: Pulmonary Disease

## 2014-08-02 ENCOUNTER — Encounter (HOSPITAL_COMMUNITY): Payer: Self-pay | Admitting: Gastroenterology

## 2015-11-04 IMAGING — CT CT BIOPSY
1 of 2 series · 2 of 6 positions shown, 5 images · non-contrast
Comparison: none

CLINICAL DATA: Pancytopenia

[Series 8: post bx 5.0 b40f · axial · 0.74mm/px · z∈[-123,-118]mm · 2 of 4 slices shown, 5 images]
[im 2/4  soft-tissue]
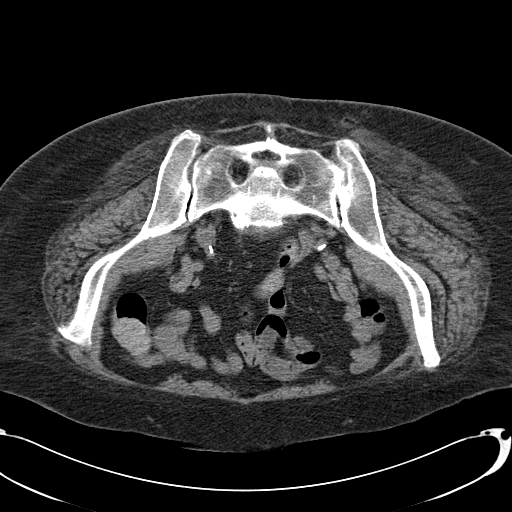
[im 2/4  lung]
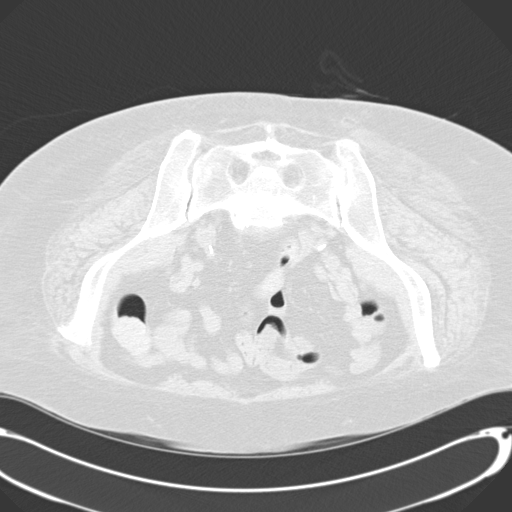
[im 2/4  bone]
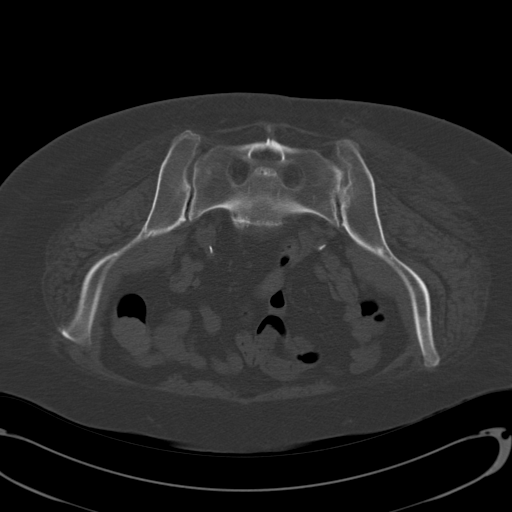
[im 3/4  soft-tissue]
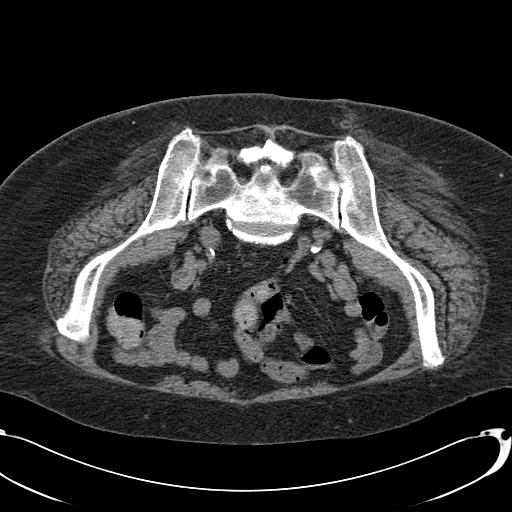
[im 3/4  lung]
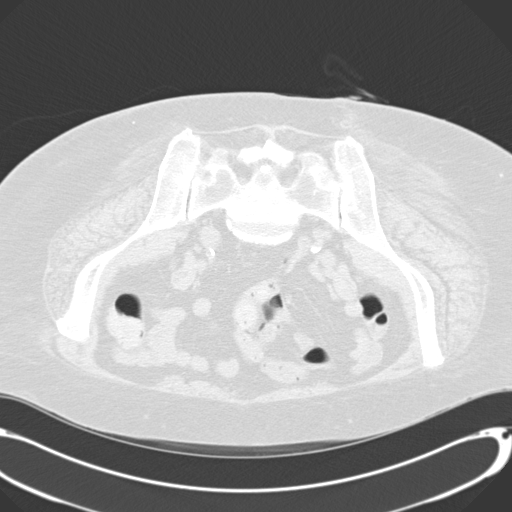

[2 of 6 positions shown; findings below may reference images not displayed]

EXAM:
CT-GUIDED BONE MARROW ASPIRATE AND CORE.

MEDICATIONS AND MEDICAL HISTORY:
Versed four mg, Fentanyl 100 mcg.

Additional Medications: none.

ANESTHESIA/SEDATION:
Moderate sedation time: 11 minutes

PROCEDURE:
The procedure, risks, benefits, and alternatives were explained to
the patient. Questions regarding the procedure were encouraged and
answered. The patient understands and consents to the procedure.

The back was prepped with Betadine in a sterile fashion, and a
sterile drape was applied covering the operative field. A sterile
gown and sterile gloves were used for the procedure.

Under CT guidance, an 11 gauge needle was inserted into the right
iliac bone via posterior approach. An aspirate was obtained. Core
was obtained. Final imaging was performed.

Patient tolerated the procedure well without complication. Vital
sign monitoring by nursing staff during the procedure will continue
as patient is in the special procedures unit for post procedure
observation.
FINDINGS: The images document guide needle placement within the right iliac
bone. Post biopsy images demonstrate no evidence hemorrhage.
IMPRESSION: Successful CT-guided bone marrow aspirate and core.
